# Patient Record
Sex: Female | Born: 1950
Health system: Southern US, Community
[De-identification: ages and names within clinical notes are randomized; demographics above are authoritative.]

## PROBLEM LIST (undated history)

## (undated) DIAGNOSIS — G43909 Migraine, unspecified, not intractable, without status migrainosus: Secondary | ICD-10-CM

## (undated) DIAGNOSIS — C801 Malignant (primary) neoplasm, unspecified: Secondary | ICD-10-CM

## (undated) DIAGNOSIS — Z9289 Personal history of other medical treatment: Secondary | ICD-10-CM

## (undated) DIAGNOSIS — C50919 Malignant neoplasm of unspecified site of unspecified female breast: Secondary | ICD-10-CM

## (undated) DIAGNOSIS — E785 Hyperlipidemia, unspecified: Secondary | ICD-10-CM

## (undated) DIAGNOSIS — Z923 Personal history of irradiation: Secondary | ICD-10-CM

## (undated) DIAGNOSIS — Z951 Presence of aortocoronary bypass graft: Secondary | ICD-10-CM

## (undated) DIAGNOSIS — K635 Polyp of colon: Secondary | ICD-10-CM

## (undated) DIAGNOSIS — T7840XA Allergy, unspecified, initial encounter: Secondary | ICD-10-CM

## (undated) DIAGNOSIS — I2510 Atherosclerotic heart disease of native coronary artery without angina pectoris: Secondary | ICD-10-CM

## (undated) DIAGNOSIS — K219 Gastro-esophageal reflux disease without esophagitis: Secondary | ICD-10-CM

## (undated) HISTORY — DX: Hyperlipidemia, unspecified: E78.5

## (undated) HISTORY — DX: Migraine, unspecified, not intractable, without status migrainosus: G43.909

## (undated) HISTORY — DX: Polyp of colon: K63.5

## (undated) HISTORY — PX: OTHER SURGICAL HISTORY: SHX169

## (undated) HISTORY — PX: COLONOSCOPY W/ POLYPECTOMY: SHX1380

## (undated) HISTORY — PX: BREAST SURGERY: SHX581

## (undated) HISTORY — DX: Allergy, unspecified, initial encounter: T78.40XA

## (undated) HISTORY — PX: EYE SURGERY: SHX253

## (undated) HISTORY — DX: Gastro-esophageal reflux disease without esophagitis: K21.9

## (undated) HISTORY — PX: ABDOMINAL HYSTERECTOMY: SHX81

## (undated) HISTORY — PX: BUNIONECTOMY: SHX129

## (undated) HISTORY — PX: COLONOSCOPY: SHX174

## (undated) HISTORY — DX: Malignant (primary) neoplasm, unspecified: C80.1

## (undated) HISTORY — DX: Presence of aortocoronary bypass graft: Z95.1

---

## 1998-05-31 ENCOUNTER — Ambulatory Visit (HOSPITAL_COMMUNITY): Admission: RE | Admit: 1998-05-31 | Discharge: 1998-05-31 | Payer: Self-pay | Admitting: *Deleted

## 1998-06-02 ENCOUNTER — Ambulatory Visit (HOSPITAL_COMMUNITY): Admission: RE | Admit: 1998-06-02 | Discharge: 1998-06-02 | Payer: Self-pay

## 1998-07-27 ENCOUNTER — Ambulatory Visit (HOSPITAL_COMMUNITY): Admission: RE | Admit: 1998-07-27 | Discharge: 1998-07-27 | Payer: Self-pay | Admitting: General Surgery

## 1998-08-05 ENCOUNTER — Encounter: Admission: RE | Admit: 1998-08-05 | Discharge: 1998-11-03 | Payer: Self-pay | Admitting: Radiation Oncology

## 1998-10-22 DIAGNOSIS — C50919 Malignant neoplasm of unspecified site of unspecified female breast: Secondary | ICD-10-CM

## 1998-10-22 DIAGNOSIS — Z923 Personal history of irradiation: Secondary | ICD-10-CM

## 1998-10-22 HISTORY — PX: BREAST LUMPECTOMY: SHX2

## 1998-10-22 HISTORY — DX: Malignant neoplasm of unspecified site of unspecified female breast: C50.919

## 1998-10-22 HISTORY — DX: Personal history of irradiation: Z92.3

## 1999-05-01 ENCOUNTER — Ambulatory Visit (HOSPITAL_COMMUNITY): Admission: RE | Admit: 1999-05-01 | Discharge: 1999-05-01 | Payer: Self-pay | Admitting: General Surgery

## 1999-05-01 ENCOUNTER — Encounter: Payer: Self-pay | Admitting: General Surgery

## 1999-10-30 ENCOUNTER — Ambulatory Visit (HOSPITAL_COMMUNITY): Admission: RE | Admit: 1999-10-30 | Discharge: 1999-10-30 | Payer: Self-pay | Admitting: General Surgery

## 1999-10-30 ENCOUNTER — Encounter: Payer: Self-pay | Admitting: General Surgery

## 2000-04-08 ENCOUNTER — Encounter: Payer: Self-pay | Admitting: General Surgery

## 2000-04-08 ENCOUNTER — Encounter: Admission: RE | Admit: 2000-04-08 | Discharge: 2000-04-08 | Payer: Self-pay | Admitting: General Surgery

## 2000-12-07 ENCOUNTER — Emergency Department (HOSPITAL_COMMUNITY): Admission: EM | Admit: 2000-12-07 | Discharge: 2000-12-07 | Payer: Self-pay | Admitting: Emergency Medicine

## 2001-04-09 ENCOUNTER — Encounter: Admission: RE | Admit: 2001-04-09 | Discharge: 2001-04-09 | Payer: Self-pay | Admitting: General Surgery

## 2001-04-09 ENCOUNTER — Encounter: Payer: Self-pay | Admitting: General Surgery

## 2001-04-19 ENCOUNTER — Emergency Department (HOSPITAL_COMMUNITY): Admission: EM | Admit: 2001-04-19 | Discharge: 2001-04-19 | Payer: Self-pay | Admitting: *Deleted

## 2001-04-19 ENCOUNTER — Encounter: Payer: Self-pay | Admitting: Emergency Medicine

## 2001-09-11 ENCOUNTER — Emergency Department (HOSPITAL_COMMUNITY): Admission: EM | Admit: 2001-09-11 | Discharge: 2001-09-11 | Payer: Self-pay | Admitting: Emergency Medicine

## 2002-04-10 ENCOUNTER — Encounter: Payer: Self-pay | Admitting: General Surgery

## 2002-04-10 ENCOUNTER — Ambulatory Visit (HOSPITAL_COMMUNITY): Admission: RE | Admit: 2002-04-10 | Discharge: 2002-04-10 | Payer: Self-pay | Admitting: General Surgery

## 2003-04-15 ENCOUNTER — Ambulatory Visit (HOSPITAL_COMMUNITY): Admission: RE | Admit: 2003-04-15 | Discharge: 2003-04-15 | Payer: Self-pay | Admitting: General Surgery

## 2003-04-15 ENCOUNTER — Encounter: Payer: Self-pay | Admitting: General Surgery

## 2004-02-28 ENCOUNTER — Emergency Department (HOSPITAL_COMMUNITY): Admission: EM | Admit: 2004-02-28 | Discharge: 2004-02-28 | Payer: Self-pay | Admitting: Emergency Medicine

## 2004-04-17 ENCOUNTER — Ambulatory Visit (HOSPITAL_COMMUNITY): Admission: RE | Admit: 2004-04-17 | Discharge: 2004-04-17 | Payer: Self-pay | Admitting: General Surgery

## 2004-07-28 ENCOUNTER — Emergency Department (HOSPITAL_COMMUNITY): Admission: EM | Admit: 2004-07-28 | Discharge: 2004-07-28 | Payer: Self-pay | Admitting: Emergency Medicine

## 2004-08-01 ENCOUNTER — Ambulatory Visit (HOSPITAL_COMMUNITY): Admission: RE | Admit: 2004-08-01 | Discharge: 2004-08-01 | Payer: Self-pay | Admitting: Orthopedic Surgery

## 2004-08-01 ENCOUNTER — Ambulatory Visit (HOSPITAL_BASED_OUTPATIENT_CLINIC_OR_DEPARTMENT_OTHER): Admission: RE | Admit: 2004-08-01 | Discharge: 2004-08-01 | Payer: Self-pay | Admitting: Orthopedic Surgery

## 2005-04-18 ENCOUNTER — Ambulatory Visit (HOSPITAL_COMMUNITY): Admission: RE | Admit: 2005-04-18 | Discharge: 2005-04-18 | Payer: Self-pay | Admitting: General Surgery

## 2006-02-20 ENCOUNTER — Encounter: Payer: Self-pay | Admitting: General Surgery

## 2006-04-22 ENCOUNTER — Ambulatory Visit (HOSPITAL_COMMUNITY): Admission: RE | Admit: 2006-04-22 | Discharge: 2006-04-22 | Payer: Self-pay | Admitting: General Surgery

## 2007-05-01 ENCOUNTER — Ambulatory Visit (HOSPITAL_COMMUNITY): Admission: RE | Admit: 2007-05-01 | Discharge: 2007-05-01 | Payer: Self-pay | Admitting: General Surgery

## 2008-05-04 ENCOUNTER — Ambulatory Visit (HOSPITAL_COMMUNITY): Admission: RE | Admit: 2008-05-04 | Discharge: 2008-05-04 | Payer: Self-pay | Admitting: Internal Medicine

## 2009-06-02 ENCOUNTER — Ambulatory Visit (HOSPITAL_COMMUNITY): Admission: RE | Admit: 2009-06-02 | Discharge: 2009-06-02 | Payer: Self-pay | Admitting: Internal Medicine

## 2010-11-12 ENCOUNTER — Encounter: Payer: Self-pay | Admitting: Internal Medicine

## 2011-03-09 NOTE — Op Note (Signed)
Jessica [name], Jessica [name]               ACCOUNT NO.:  [id number]   MEDICAL RECORD NO.:  [id number]          PATIENT TYPE:  AMB   LOCATION:  DSC                          FACILITY:  [facility]   PHYSICIAN:  Katy Fitch. Sypher Montez Hageman., M.D.DATE OF BIRTH:  Dec 28, 1950   DATE OF PROCEDURE:  DATE OF DISCHARGE:                                 OPERATIVE REPORT   DATE OF OPERATION:  August 01, 2004.   PREOPERATIVE DIAGNOSIS:  Interarticular, displaced, middle phalangeal  fracture, left index finger at PIP joint.   POSTOPERATIVE DIAGNOSIS:  Interarticular, displaced, middle phalangeal  fracture, left index finger at PIP joint.   OPERATION:  Open reduction, internal fixation, left index finger, middle  phalangeal proximal metaphyseal and epiphyseal fracture, interarticular at  left index PIP joint, utilizing lag screw fixation x3.   OPERATING SURGEON:  Katy Fitch. Sypher, MD.   ASSISTANT:  Marveen Reeks. Dasnoit, PA-C.   ANESTHESIA:  General by LMA.   SUPERVISING ANESTHESIOLOGIST:  Zenon Mayo, MD.   INDICATIONS:  Jessica [name] is a 60 year old woman, who sustained a  significant injury in a fall to her left index finger.  She was seen at the  emergency room where x-rays revealed a displaced interarticular fracture of  her left index middle phalanx with splitting of the articular surface and  subluxation of the PIP joint.   She was splinted and referred to the [facility] for followup  care.   After examination in the office on July 31, 2004, we recommended  proceeding with open reduction, internal fixation of articular fracture to  obtain and maintain a reduction with lag screw fixation.   A pertinent note is that at age 25, she sustained a complex injury to the  flexor surface of her left index finger and has had no flexion tendon  function and altered sensibility since the time of the injury.   This decreases the demands on the finger.  However, due to the unstable  interarticular nature of the fracture, she is still a candidate for open  reduction, internal fixation at this time to prevent deformity and to  provide a congruous articular surface.   Jessica [name] is brought to the operating room and placed in the supine  position on the operating table.  Following induction of general anesthesia  by LMA, the left arm was prepped with Betadine soap and solution and  sterilely draped.   Following exsanguination of the left arm with an Esmarch bandage, the  tourniquet was inflated to 220 mmHg.   Procedure commenced with a curvilinear incision exposing the proximal half  of the middle phalanx and PIP joint.   The extensor mechanism was split in the midline releasing the triangular  ligament.  The lateral bands were retracted radially and ulnar, and a self-  retaining retractor was placed.   The fracture was reduced manually and secured with a 0.025 inch provisional  Kirschner wire.  Anatomic reduction of the joint surface was achieved.   The volar cortex of the middle phalanx remained intact, allowing an  excellent purchase for screws.   The  dorsal fragment was comminuted.   A central 1.5 mm lag screw was placed, reducing the main joint fragments  anatomically.  A radial and ulnar 1.3 mm lag screw was placed, securing the  dorsal fragment further to the volar fragment.  The additional fixation was  required due to the fact that the dorsal fragment was comminuted.   Anatomic reduction and good position of the hardware were achieved.   The wound was then irrigated thoroughly, and the triangular ligament  repaired with multiple mattress sutures of 4-0 Vicryl followed by repair of  the skin with intradermal 4-0 Prolene.   A compressive dressing was applied with a hand-based index finger sandwich  splint to protect the construct.   There were no apparent complications.  Jessica [name] tolerated the surgery and  anesthesia well.  She will be discharged  to the care of her family with  prescriptions for Dilaudid 2 mg 1 or 2 tablets p.o.q.4-6h p.r.n. pain, 30  tablets without refills.  Also, Motrin 600 mg 1 p.o. q.6h p.r.n. pain, 30  tablets with 1 refill, and Keflex 500 mg 1 p.o. q.8h x4 days as a  prophylactic antibiotic.       RVS/MEDQ  D:  08/01/2004  T:  08/01/2004  Job:  [id number]   cc:   2 copies to Dr. Stark Jock office

## 2015-10-25 DIAGNOSIS — J069 Acute upper respiratory infection, unspecified: Secondary | ICD-10-CM | POA: Diagnosis not present

## 2015-11-15 DIAGNOSIS — L668 Other cicatricial alopecia: Secondary | ICD-10-CM | POA: Diagnosis not present

## 2015-11-15 DIAGNOSIS — L232 Allergic contact dermatitis due to cosmetics: Secondary | ICD-10-CM | POA: Diagnosis not present

## 2016-02-21 DIAGNOSIS — R5383 Other fatigue: Secondary | ICD-10-CM | POA: Diagnosis not present

## 2016-02-21 DIAGNOSIS — L218 Other seborrheic dermatitis: Secondary | ICD-10-CM | POA: Diagnosis not present

## 2016-02-21 DIAGNOSIS — R21 Rash and other nonspecific skin eruption: Secondary | ICD-10-CM | POA: Diagnosis not present

## 2016-02-21 DIAGNOSIS — L298 Other pruritus: Secondary | ICD-10-CM | POA: Diagnosis not present

## 2016-02-21 DIAGNOSIS — R030 Elevated blood-pressure reading, without diagnosis of hypertension: Secondary | ICD-10-CM | POA: Diagnosis not present

## 2016-02-21 DIAGNOSIS — Z79899 Other long term (current) drug therapy: Secondary | ICD-10-CM | POA: Diagnosis not present

## 2016-03-12 DIAGNOSIS — L649 Androgenic alopecia, unspecified: Secondary | ICD-10-CM | POA: Diagnosis not present

## 2016-03-12 DIAGNOSIS — L218 Other seborrheic dermatitis: Secondary | ICD-10-CM | POA: Diagnosis not present

## 2016-04-23 DIAGNOSIS — L219 Seborrheic dermatitis, unspecified: Secondary | ICD-10-CM | POA: Diagnosis not present

## 2016-06-19 DIAGNOSIS — Z853 Personal history of malignant neoplasm of breast: Secondary | ICD-10-CM | POA: Insufficient documentation

## 2016-06-26 DIAGNOSIS — R922 Inconclusive mammogram: Secondary | ICD-10-CM | POA: Diagnosis not present

## 2016-06-26 DIAGNOSIS — Z923 Personal history of irradiation: Secondary | ICD-10-CM | POA: Diagnosis not present

## 2016-06-26 DIAGNOSIS — Z853 Personal history of malignant neoplasm of breast: Secondary | ICD-10-CM | POA: Diagnosis not present

## 2016-06-29 DIAGNOSIS — S53421A Ulnohumeral (joint) sprain of right elbow, initial encounter: Secondary | ICD-10-CM | POA: Diagnosis not present

## 2016-07-13 DIAGNOSIS — S53421D Ulnohumeral (joint) sprain of right elbow, subsequent encounter: Secondary | ICD-10-CM | POA: Diagnosis not present

## 2016-07-19 ENCOUNTER — Ambulatory Visit: Payer: Self-pay | Admitting: Internal Medicine

## 2016-07-27 ENCOUNTER — Other Ambulatory Visit (INDEPENDENT_AMBULATORY_CARE_PROVIDER_SITE_OTHER): Payer: Medicare Other

## 2016-07-27 ENCOUNTER — Ambulatory Visit (INDEPENDENT_AMBULATORY_CARE_PROVIDER_SITE_OTHER): Payer: Medicare Other | Admitting: Internal Medicine

## 2016-07-27 ENCOUNTER — Encounter: Payer: Self-pay | Admitting: Internal Medicine

## 2016-07-27 VITALS — BP 136/60 | HR 51 | Temp 98.4°F | Resp 16 | Ht 66.5 in | Wt 186.0 lb

## 2016-07-27 DIAGNOSIS — E789 Disorder of lipoprotein metabolism, unspecified: Secondary | ICD-10-CM | POA: Diagnosis not present

## 2016-07-27 DIAGNOSIS — Z23 Encounter for immunization: Secondary | ICD-10-CM | POA: Diagnosis not present

## 2016-07-27 DIAGNOSIS — E559 Vitamin D deficiency, unspecified: Secondary | ICD-10-CM

## 2016-07-27 DIAGNOSIS — E538 Deficiency of other specified B group vitamins: Secondary | ICD-10-CM

## 2016-07-27 DIAGNOSIS — R5383 Other fatigue: Secondary | ICD-10-CM | POA: Diagnosis not present

## 2016-07-27 DIAGNOSIS — K219 Gastro-esophageal reflux disease without esophagitis: Secondary | ICD-10-CM

## 2016-07-27 DIAGNOSIS — R7301 Impaired fasting glucose: Secondary | ICD-10-CM | POA: Diagnosis not present

## 2016-07-27 DIAGNOSIS — Z853 Personal history of malignant neoplasm of breast: Secondary | ICD-10-CM

## 2016-07-27 DIAGNOSIS — M79601 Pain in right arm: Secondary | ICD-10-CM

## 2016-07-27 LAB — LIPID PANEL
CHOL/HDL RATIO: 6
Cholesterol: 220 mg/dL — ABNORMAL HIGH (ref 0–200)
HDL: 37.4 mg/dL — AB (ref >39.00)
LDL Cholesterol: 156 mg/dL — ABNORMAL HIGH (ref 0–99)
NONHDL: 182.5
Triglycerides: 133 mg/dL (ref 0.0–149.0)
VLDL: 26.6 mg/dL (ref 0.0–40.0)

## 2016-07-27 LAB — COMPREHENSIVE METABOLIC PANEL
ALT: 21 U/L (ref 0–35)
AST: 23 U/L (ref 0–37)
Albumin: 4 g/dL (ref 3.5–5.2)
Alkaline Phosphatase: 49 U/L (ref 39–117)
BILIRUBIN TOTAL: 0.3 mg/dL (ref 0.2–1.2)
BUN: 10 mg/dL (ref 6–23)
CO2: 31 meq/L (ref 19–32)
Calcium: 9.4 mg/dL (ref 8.4–10.5)
Chloride: 105 mEq/L (ref 96–112)
Creatinine, Ser: 0.99 mg/dL (ref 0.40–1.20)
GFR: 72.19 mL/min (ref >60.00)
GLUCOSE: 85 mg/dL (ref 70–99)
Potassium: 4.1 mEq/L (ref 3.5–5.1)
SODIUM: 142 meq/L (ref 135–145)
TOTAL PROTEIN: 7.2 g/dL (ref 6.0–8.3)

## 2016-07-27 LAB — VITAMIN D 25 HYDROXY (VIT D DEFICIENCY, FRACTURES): VITD: 21.83 ng/mL — AB (ref 30.00–100.00)

## 2016-07-27 LAB — HEMOGLOBIN A1C: HEMOGLOBIN A1C: 5.8 % (ref 4.6–6.5)

## 2016-07-27 LAB — CBC
HCT: 37 % (ref 36.0–46.0)
HEMOGLOBIN: 12.4 g/dL (ref 12.0–15.0)
MCHC: 33.5 g/dL (ref 30.0–36.0)
MCV: 86.9 fl (ref 78.0–100.0)
PLATELETS: 240 10*3/uL (ref 150.0–400.0)
RBC: 4.26 Mil/uL (ref 3.87–5.11)
RDW: 13.9 % (ref 11.5–15.5)
WBC: 5.1 10*3/uL (ref 4.0–10.5)

## 2016-07-27 LAB — TSH: TSH: 1.55 u[IU]/mL (ref 0.35–4.50)

## 2016-07-27 LAB — VITAMIN B12: Vitamin B-12: 300 pg/mL (ref 211–911)

## 2016-07-27 MED ORDER — FAMOTIDINE 40 MG PO TABS: 40.0000 mg | ORAL_TABLET | Freq: Every day | ORAL | 3 refills | Status: DC

## 2016-07-27 NOTE — Progress Notes (Signed)
Pre visit review using our clinic review tool, if applicable. No additional management support is needed unless otherwise documented below in the visit note. 

## 2016-07-27 NOTE — Progress Notes (Signed)
   Subjective:    Patient ID: Nicholas Lose, female    DOB: 06-02-1951, 65 y.o.   MRN: [id number]  HPI The patient is a new 65 YO female coming in for right arm pain. Started when she was moving from [place] [employer] and moving a lot of boxes and picking up lots of things. She saw a specialist in [place] who took x-rays which were normal. She was told to ice and use ibuprofen which did not help. Sometimes it is so bad it limits her strength. She denies numbness in her arm or shoulder. Pain is from the elbow to the hand. She has other concerns about her chronic problems so see A/P for details.   PMH, [facility], social history reviewed and updated.   Review of Systems  Constitutional: Negative for activity change, appetite change, fatigue, fever and unexpected weight change.  Eyes: Negative.   Respiratory: Negative.   Cardiovascular: Negative for chest pain, palpitations and leg swelling.  Gastrointestinal: Negative for abdominal distention, abdominal pain, constipation, diarrhea and nausea.       Worsening GERD  Musculoskeletal: Positive for arthralgias and myalgias. Negative for back pain, gait problem and joint swelling.  Skin: Negative.   Neurological: Negative for dizziness, weakness, light-headedness and numbness.  Psychiatric/Behavioral: Negative.       Objective:   Physical Exam  Constitutional: She is oriented to person, place, and time. She appears well-developed and well-nourished.  HENT:  Head: Normocephalic and atraumatic.  Eyes: EOM are normal.  Neck: Normal range of motion.  Cardiovascular: Normal rate and regular rhythm.   Pulmonary/Chest: Effort normal and breath sounds normal. No respiratory distress. She has no wheezes. She has no rales.  Abdominal: Soft. Bowel sounds are normal. She exhibits no distension. There is no tenderness. There is no rebound.  Musculoskeletal: She exhibits tenderness.  Pain in the right forearm and radiates into the hand from the elbow with palpation.     Neurological: She is alert and oriented to person, place, and time. Coordination normal.  Skin: Skin is warm and dry.  Psychiatric: She has a normal mood and affect.   Vitals:   07/27/16 0904  BP: 136/60  Pulse: (!) 51  Resp: 16  Temp: 98.4 F (36.9 C)  TempSrc: Oral  SpO2: 99%  Weight: 186 lb (84.4 kg)  Height: 5' 6.5" (1.689 m)      Assessment & Plan:  Flu and prevnar 13 given at visit.

## 2016-07-27 NOTE — Patient Instructions (Addendum)
We have sent in pepcid for the heart burn to help more. You can take it in the evening to help with night time symptoms. Work on eating earlier in the evening to help avoid symptoms.   We are checking the blood work today and will call you back with the results.  We will have you see Dr. Tamala Julian who is a sports medicine doctor to check the arm.   Food Choices for Gastroesophageal Reflux Disease, Adult When you have gastroesophageal reflux disease (GERD), the foods you eat and your eating habits are very important. Choosing the right foods can help ease the discomfort of GERD. WHAT GENERAL GUIDELINES DO I NEED TO FOLLOW?  Choose fruits, vegetables, whole grains, low-fat dairy products, and low-fat meat, fish, and poultry.  Limit fats such as oils, salad dressings, butter, nuts, and avocado.  Keep a food diary to identify foods that cause symptoms.  Avoid foods that cause reflux. These may be different for different people.  Eat frequent small meals instead of three large meals each day.  Eat your meals slowly, in a relaxed setting.  Limit fried foods.  Cook foods using methods other than frying.  Avoid drinking alcohol.  Avoid drinking large amounts of liquids with your meals.  Avoid bending over or lying down until 2-3 hours after eating. WHAT FOODS ARE NOT RECOMMENDED? The following are some foods and drinks that may worsen your symptoms: Vegetables Tomatoes. Tomato juice. Tomato and spaghetti sauce. Chili peppers. Onion and garlic. Horseradish. Fruits Oranges, grapefruit, and lemon (fruit and juice). Meats High-fat meats, fish, and poultry. This includes hot dogs, ribs, ham, sausage, salami, and bacon. Dairy Whole milk and chocolate milk. Sour cream. Cream. Butter. Ice cream. Cream cheese.  Beverages Coffee and tea, with or without caffeine. Carbonated beverages or energy drinks. Condiments Hot sauce. Barbecue sauce.  Sweets/Desserts Chocolate and cocoa. Donuts.  Peppermint and spearmint. Fats and Oils High-fat foods, including [place] fries and potato chips. Other Vinegar. Strong spices, such as black pepper, white pepper, red pepper, cayenne, curry powder, cloves, ginger, and chili powder. The items listed above may not be a complete list of foods and beverages to avoid. Contact your dietitian for more information.   This information is not intended to replace advice given to you by your health care provider. Make sure you discuss any questions you have with your health care provider.   Document Released: 10/08/2005 Document Revised: 10/29/2014 Document Reviewed: 08/12/2013 Elsevier Interactive Patient Education [employer].

## 2016-07-28 DIAGNOSIS — M79601 Pain in right arm: Secondary | ICD-10-CM | POA: Insufficient documentation

## 2016-07-28 DIAGNOSIS — K219 Gastro-esophageal reflux disease without esophagitis: Secondary | ICD-10-CM | POA: Insufficient documentation

## 2016-07-28 NOTE — Assessment & Plan Note (Signed)
Not doing well on zantac and does not want to be on PPI so will try pepcid rx today.

## 2016-07-28 NOTE — Assessment & Plan Note (Signed)
In 1999 and now >10 years out from cancer. Last mammogram in September normal.

## 2016-07-28 NOTE — Assessment & Plan Note (Signed)
Refer to sports med for [place] and treatment. Likely a tendon problem with extended course. She is encouraged to continue with ibuprofen for pain as needed.

## 2016-08-07 ENCOUNTER — Ambulatory Visit: Payer: Medicare Other | Admitting: Internal Medicine

## 2016-08-07 NOTE — Progress Notes (Signed)
[name] [address] [place], [place] [address] Phone: [phone] Subjective:    I'm seeing this patient by the request  of:  Hoyt Koch, MD  CC: right arm pain   [id number]  Jessica [name] is a 65 y.o. female coming in with complaint of right arm pain.  Patient recently moved. Did have to do a lot of repetitive lifting. Saw specialist in another sitting and did have x-rays which she was told was normal. Patient has been trying ice and over-the-counter anti-inflammatories with no significant improvement. States that she is having limited strength. States that there is some radiation of pain down the arm including some numbness. Most the pain seems to be from the elbow to the hand.States that there is very minimal pain of the upper arm at this time. Denies any neck pain associated with it. Rates the severity of pain though is 7 out of 10. Sometimes has days where it seems to be getting better and then daily that is very severe that wakes her up at night.     Past Medical History:  Diagnosis Date  . Allergy   . Cancer [facility])    breast  . Colon polyp   . GERD (gastroesophageal reflux disease)   . Hyperlipidemia   . Migraine    Past Surgical History:  Procedure Laterality Date  . ABDOMINAL HYSTERECTOMY    . BREAST SURGERY    . EYE SURGERY    . hemorrhoid     Social History   Social History  . Marital status: Married    Spouse name: N/A  . Number of children: N/A  . Years of education: N/A   Social History Main Topics  . Smoking status: Never Smoker  . Smokeless tobacco: Never Used  . Alcohol use Yes  . Drug use:   . Sexual activity: Yes   Other Topics Concern  . Not on file   Social History Narrative  . No narrative on file   Allergies  Allergen Reactions  . Codeine   . Sulfa Antibiotics    Family History  Problem Relation Age of Onset  . Heart disease Brother   . Asthma Paternal Aunt   . Diabetes Paternal Aunt    . Stroke Paternal Uncle   . Kidney disease Paternal Uncle     Past medical history, social, surgical and family history all reviewed in electronic medical record.  No pertanent information unless stated regarding to the chief complaint.   Review of Systems: No headache, visual changes, nausea, vomiting, diarrhea, constipation, dizziness, abdominal pain, skin rash, fevers, chills, night sweats, weight loss, swollen lymph nodes, body aches, joint swelling, muscle aches, chest pain, shortness of breath, mood changes.   Objective  There were no vitals taken for this visit.  General: No apparent distress alert and oriented x3 mood and affect normal, dressed appropriately.  HEENT: Pupils equal, extraocular movements intact  Respiratory: Patient's speak in full sentences and does not appear short of breath  Cardiovascular: No lower extremity edema, non tender, no erythema  Skin: Warm dry intact with no signs of infection or rash on extremities or on axial skeleton.  Abdomen: Soft nontender  Neuro: Cranial nerves II through XII are intact, neurovascularly intact in all extremities with 2+ DTRs and 2+ pulses.  Lymph: No lymphadenopathy of posterior or anterior cervical chain or axillae bilaterally.  Gait normal with good balance and coordination.  MSK:  Non tender with full range of  motion and good stability and symmetric strength and tone of shoulders, , wrist, hip, knee and ankles bilaterally.  Neck: Inspection unremarkable. No palpable stepoffs. Negative Spurling's maneuver. Full neck range of motion Grip strength and sensation normal in bilateral hands Strength good C4 to T1 distribution No sensory change to C4 to T1 Negative Hoffman sign bilaterally Reflexes normal  Elbow: Right Unremarkable to inspection. Range of motion full pronation, supination, flexion, extension. Strength is full to all of the above directions Stable to varus, valgus stress. Negative moving valgus stress  test. Severe tenderness within the suprafascial muscles of the flexor forearm Ulnar nerve does not sublux. Negative cubital tunnel Tinel's. Contralateral unremarkable Negative Tinel's at rest  Musculoskeletal ultrasound was performed and interpreted by Charlann Boxer D.O.   Elbow:  Lateral epicondyle and common extensor tendon origin visualized.  No edema, effusions, or avulsions seen.  Radial head unremarkable and located in annular ligament Medial epicondyle and common flexor tendon origin visualized.   Patient though in the belly of the superficial digitorum muscle does have an area that appears to be a muscle tear. Measures 1.6 cm in diameter. Hypoechoic changes and mild increase in Doppler flow surrounding the area  IMPRESSION:  Superficial flexor digitorum muscle tear    Impression and Recommendations:     This case required medical decision making of moderate complexity.      Note: This dictation was prepared with Dragon dictation along with smaller phrase technology. Any transcriptional errors that result from this process are unintentional.

## 2016-08-08 ENCOUNTER — Encounter: Payer: Self-pay | Admitting: Family Medicine

## 2016-08-08 ENCOUNTER — Ambulatory Visit (INDEPENDENT_AMBULATORY_CARE_PROVIDER_SITE_OTHER): Payer: Medicare Other | Admitting: Family Medicine

## 2016-08-08 ENCOUNTER — Ambulatory Visit: Payer: Self-pay

## 2016-08-08 VITALS — BP 130/82 | HR 49 | Wt 186.0 lb

## 2016-08-08 DIAGNOSIS — M79601 Pain in right arm: Secondary | ICD-10-CM

## 2016-08-08 DIAGNOSIS — M779 Enthesopathy, unspecified: Secondary | ICD-10-CM

## 2016-08-08 DIAGNOSIS — M778 Other enthesopathies, not elsewhere classified: Secondary | ICD-10-CM

## 2016-08-08 NOTE — Assessment & Plan Note (Signed)
Patient does have a tear noted today. This is likely why patient has not improved a significant amount. Patient will be put in a wrist brace to avoid any flexion of the wrist splint the muscle. We also discussed icing regimen, given trial of topical anti-inflammatories, patient will follow-up again in 2 weeks. At that time if improvement we will started to formal physical therapy or home exercises.

## 2016-08-08 NOTE — Patient Instructions (Signed)
Right sided muscle tear Wear wrist brace day and night for 2 weeks Only lift things overhand and in the wrist splint Ice 20 minutes 2 times daily. Usually after activity and before bed. pennsaid pinkie amount topically 2 times daily as needed.  No lifting more then a coffee cup See me again in 2 weeks and make sure you are healing

## 2016-08-13 DIAGNOSIS — H04123 Dry eye syndrome of bilateral lacrimal glands: Secondary | ICD-10-CM | POA: Diagnosis not present

## 2016-08-21 NOTE — Progress Notes (Signed)
[name] [place],  [address] Phone: [phone] Subjective:    CC: right arm pain Follow uo  [id number]  Jessica [name] is a 65 y.o. female coming in with complaint of right arm pain.  Patient was found to have a right flexor digitorum muscle tear. Patient was seen me in a brace, avoid any lifting, icing regimen. Patient states She is making progress. Still has some mild discomfort but nothing severe. States that she seems to be doing relatively well. No numbness. States that sometimes the hand and wrist feels tight. Patient has been religious to wear the brace.      Past medical, surgical, social and family history all reviewed as of 08/21/16.  No pertanent information unless stated regarding to the chief complaint.  Past Medical History:  Diagnosis Date  . Allergy   . Cancer [facility]    breast  . Colon polyp   . GERD (gastroesophageal reflux disease)   . Hyperlipidemia   . Migraine    Past Surgical History:  Procedure Laterality Date  . ABDOMINAL HYSTERECTOMY    . BREAST SURGERY    . EYE SURGERY    . hemorrhoid     Social History   Social History  . Marital status: Married    Spouse name: N/A  . Number of children: N/A  . Years of education: N/A   Social History Main Topics  . Smoking status: Never Smoker  . Smokeless tobacco: Never Used  . Alcohol use Yes  . Drug use:   . Sexual activity: Yes   Other Topics Concern  . Not on file   Social History Narrative  . No narrative on file   Allergies  Allergen Reactions  . Codeine   . Sulfa Antibiotics    Family History  Problem Relation Age of Onset  . Heart disease Brother   . Asthma Paternal Aunt   . Diabetes Paternal Aunt   . Stroke Paternal Uncle   . Kidney disease Paternal Uncle      Review of systems reviewed as of 08/21/16 Review of Systems: No headache, visual changes, nausea, vomiting, diarrhea, constipation, dizziness, abdominal pain, skin  rash, fevers, chills, night sweats, weight loss, swollen lymph nodes, body aches, joint swelling, muscle aches, chest pain, shortness of breath, mood changes.   Objective  There were no vitals taken for this visit. Systems examined below as of 08/21/16   General: Patient is not under any distress. Alert and oriented 3.  HEENT: Pupils equal, extraocular movements intact  Respiratory: Patient's speak in full sentences and does not appear short of breath  Cardiovascular: No lower extremity edema Skin: Warm dry intact with no signs of infection or rash on extremities or on axial skeleton.  Abdomen: Soft nontender Neuro: Cranial nerves II through XII are intact, neurovascularly intact in all extremities with 2+ DTRs and 2+ pulses.  Lymph: No lymphadenopathy of posterior or anterior cervical chain or axillae bilaterally.  Gait normal with good balance and coordination.  MSK:  Non tender with full range of motion and good stability and symmetric strength and tone of shoulders,  wrist, hip, knee and ankles bilaterally.    Elbow: Right Unremarkable to inspection. Range of motion full pronation, supination, flexion, extension. Strength is full to all of the above directions Stable to varus, valgus stress. Negative moving valgus stress test. Very minimal tenderness still remaining on the flexor digitorum of the forearm. Negative cubital tunnel Tinel's. Contralateral unremarkable  Negative Tinel's at rest  Musculoskeletal ultrasound was performed and interpreted by Charlann Boxer D.O.   Elbow:  Superficial digitorum muscle does show good healing. Scar tissue formation noted. Still having increasing Doppler flow at the tendon insertion.  IMPRESSION:  Superficial flexor digitorum muscle tear with significant interval healing with continued tendinitis.     Impression and Recommendations:     This case required medical decision making of moderate complexity.      Note: This dictation was  prepared with Dragon dictation along with smaller phrase technology. Any transcriptional errors that result from this process are unintentional.

## 2016-08-22 ENCOUNTER — Encounter: Payer: Self-pay | Admitting: Family Medicine

## 2016-08-22 ENCOUNTER — Ambulatory Visit (INDEPENDENT_AMBULATORY_CARE_PROVIDER_SITE_OTHER): Payer: Medicare Other | Admitting: Family Medicine

## 2016-08-22 ENCOUNTER — Ambulatory Visit: Payer: Self-pay

## 2016-08-22 VITALS — BP 124/82 | HR 60 | Wt 186.0 lb

## 2016-08-22 DIAGNOSIS — M779 Enthesopathy, unspecified: Secondary | ICD-10-CM | POA: Diagnosis not present

## 2016-08-22 DIAGNOSIS — S59911D Unspecified injury of right forearm, subsequent encounter: Secondary | ICD-10-CM

## 2016-08-22 DIAGNOSIS — M778 Other enthesopathies, not elsewhere classified: Secondary | ICD-10-CM

## 2016-08-22 NOTE — Patient Instructions (Signed)
Great to see you  Jessica [name] is your friend Wear brace day and night for this week and the weekend Starting Monday wear it only at night for another 2 weeks.  Monday also start exercises 3 times a week.  Avoid heavy lifting.  See me again in 4 weeks to make sure completely healed.

## 2016-08-22 NOTE — Assessment & Plan Note (Signed)
Patient is making progress at this time. Given home exercises that I think be more beneficial. Patient will start this. We discussed wearing the brace only at night. We discussed home exercises and patient work with Product/process development scientist. We discussed icing regimen. Patient will come back and see me again in 4 weeks to make sure that she is completely healed at that time.

## 2016-09-18 NOTE — Progress Notes (Signed)
[name] [address] [place], [place] [address] Phone: [phone] Subjective:    CC: right arm pain Follow uo  [id number]  Jessica [name] is a 65 y.o. female coming in with complaint of right arm pain.  Patient was found to have a right flexor digitorum muscle tear.Patient is making significant improvement in last follow-up. Patient was to continue to increase activity slowly. Patient states Approximately on a percent better. Patient did have one day where she had severe amount of pain near the elbow that completely went away on its own. Does not remember any true injury. Feels though that at this point seems to be back to her baseline.      Past medical, surgical, social and family history all reviewed as of 09/19/16.  No pertanent information unless stated regarding to the chief complaint.  Past Medical History:  Diagnosis Date  . Allergy   . Cancer [facility])    breast  . Colon polyp   . GERD (gastroesophageal reflux disease)   . Hyperlipidemia   . Migraine    Past Surgical History:  Procedure Laterality Date  . ABDOMINAL HYSTERECTOMY    . BREAST SURGERY    . EYE SURGERY    . hemorrhoid     Social History   Social History  . Marital status: Married    Spouse name: N/A  . Number of children: N/A  . Years of education: N/A   Social History Main Topics  . Smoking status: Never Smoker  . Smokeless tobacco: Never Used  . Alcohol use Yes  . Drug use:   . Sexual activity: Yes   Other Topics Concern  . None   Social History Narrative  . None   Allergies  Allergen Reactions  . Codeine   . Sulfa Antibiotics    Family History  Problem Relation Age of Onset  . Heart disease Brother   . Asthma Paternal Aunt   . Diabetes Paternal Aunt   . Stroke Paternal Uncle   . Kidney disease Paternal Uncle      Review of systems reviewed as of 09/19/16 Review of Systems: No headache, visual changes, nausea, vomiting, diarrhea,  constipation, dizziness, abdominal pain, skin rash, fevers, chills, night sweats, weight loss, swollen lymph nodes, body aches, joint swelling, muscle aches, chest pain, shortness of breath, mood changes.   Objective  Blood pressure 138/80, pulse (!) 50, height 5\' 7"  (1.702 m), weight 188 lb (85.3 kg), SpO2 98 %. Systems examined below as of 09/19/16   General: Patient is not under any distress. Alert and oriented 3.  HEENT: Pupils equal, extraocular movements intact  Respiratory: Patient's speak in full sentences and does not appear short of breath  Cardiovascular: No lower extremity edema Skin: Warm dry intact with no signs of infection or rash on extremities or on axial skeleton.  Abdomen: Soft nontender Neuro: Cranial nerves II through XII are intact, neurovascularly intact in all extremities with 2+ DTRs and 2+ pulses.  Lymph: No lymphadenopathy of posterior or anterior cervical chain or axillae bilaterally.  Gait normal with good balance and coordination.  MSK:  Non tender with full range of motion and good stability and symmetric strength and tone of shoulders,  wrist, hip, knee and ankles bilaterally.    Elbow: Right Unremarkable to inspection. Range of motion full pronation, supination, flexion, extension. Strength is full to all of the above directions Stable to varus, valgus stress. Negative moving valgus stress test. Nontender over the  form muscle itself Negative cubital tunnel Tinel's. Contralateral unremarkable Negative Tinel's at rest Contralateral elbow unremarkable    Impression and Recommendations:     This case required medical decision making of moderate complexity.      Note: This dictation was prepared with Dragon dictation along with smaller phrase technology. Any transcriptional errors that result from this process are unintentional.

## 2016-09-19 ENCOUNTER — Ambulatory Visit (INDEPENDENT_AMBULATORY_CARE_PROVIDER_SITE_OTHER): Payer: Medicare Other | Admitting: Family Medicine

## 2016-09-19 ENCOUNTER — Encounter: Payer: Self-pay | Admitting: Family Medicine

## 2016-09-19 DIAGNOSIS — M779 Enthesopathy, unspecified: Secondary | ICD-10-CM

## 2016-09-19 DIAGNOSIS — M778 Other enthesopathies, not elsewhere classified: Secondary | ICD-10-CM

## 2016-09-19 NOTE — Assessment & Plan Note (Signed)
Asian seems to be completely healed at this time. Can follow-up more on an as-needed basis. Encourage her to continue some mild strengthening exercises 2 times a week otherwise follow-up as needed.

## 2016-10-01 DIAGNOSIS — H524 Presbyopia: Secondary | ICD-10-CM | POA: Diagnosis not present

## 2016-10-01 DIAGNOSIS — H04123 Dry eye syndrome of bilateral lacrimal glands: Secondary | ICD-10-CM | POA: Diagnosis not present

## 2016-10-01 DIAGNOSIS — Z961 Presence of intraocular lens: Secondary | ICD-10-CM | POA: Diagnosis not present

## 2016-10-02 ENCOUNTER — Ambulatory Visit (INDEPENDENT_AMBULATORY_CARE_PROVIDER_SITE_OTHER)
Admission: RE | Admit: 2016-10-02 | Discharge: 2016-10-02 | Disposition: A | Discharge status: Home / Self Care | Payer: Medicare Other | Source: Ambulatory Visit | Attending: Internal Medicine | Admitting: Internal Medicine

## 2016-10-02 ENCOUNTER — Other Ambulatory Visit: Payer: Medicare Other

## 2016-10-02 ENCOUNTER — Ambulatory Visit (INDEPENDENT_AMBULATORY_CARE_PROVIDER_SITE_OTHER): Payer: Medicare Other | Admitting: Internal Medicine

## 2016-10-02 ENCOUNTER — Encounter: Payer: Self-pay | Admitting: Internal Medicine

## 2016-10-02 VITALS — BP 132/68 | HR 50 | Temp 98.3°F | Resp 12 | Ht 67.0 in | Wt 187.1 lb

## 2016-10-02 DIAGNOSIS — E2839 Other primary ovarian failure: Secondary | ICD-10-CM

## 2016-10-02 DIAGNOSIS — Z0000 Encounter for general adult medical examination without abnormal findings: Secondary | ICD-10-CM | POA: Diagnosis not present

## 2016-10-02 DIAGNOSIS — Z23 Encounter for immunization: Secondary | ICD-10-CM

## 2016-10-02 DIAGNOSIS — Z1159 Encounter for screening for other viral diseases: Secondary | ICD-10-CM

## 2016-10-02 LAB — HEPATITIS C ANTIBODY: HCV Ab: NEGATIVE

## 2016-10-02 NOTE — Progress Notes (Signed)
Pre visit review using our clinic review tool, if applicable. No additional management support is needed unless otherwise documented below in the visit note. 

## 2016-10-02 NOTE — Patient Instructions (Signed)
We have given you the tetanus shot today.   We will get the bone density done.   Health Maintenance, Female Introduction Adopting a healthy lifestyle and getting preventive care can go a long way to promote health and wellness. Talk with your health care provider about what schedule of regular examinations is right for you. This is a good chance for you to check in with your provider about disease prevention and staying healthy. In between checkups, there are plenty of things you can do on your own. Experts have done a lot of research about which lifestyle changes and preventive measures are most likely to keep you healthy. Ask your health care provider for more information. Weight and diet Eat a healthy diet  Be sure to include plenty of vegetables, fruits, low-fat dairy products, and lean protein.  Do not eat a lot of foods high in solid fats, added sugars, or salt.  Get regular exercise. This is one of the most important things you can do for your health.  Most adults should exercise for at least 150 minutes each week. The exercise should increase your heart rate and make you sweat (moderate-intensity exercise).  Most adults should also do strengthening exercises at least twice a week. This is in addition to the moderate-intensity exercise. Maintain a healthy weight  Body mass index (BMI) is a measurement that can be used to identify possible weight problems. It estimates body fat based on height and weight. Your health care provider can help determine your BMI and help you achieve or maintain a healthy weight.  For females 8 years of age and older:  A BMI below 18.5 is considered underweight.  A BMI of 18.5 to 24.9 is normal.  A BMI of 25 to 29.9 is considered overweight.  A BMI of 30 and above is considered obese. Watch levels of cholesterol and blood lipids  You should start having your blood tested for lipids and cholesterol at 67 years of age, then have this test every 5  years.  You may need to have your cholesterol levels checked more often if:  Your lipid or cholesterol levels are high.  You are older than 66 years of age.  You are at high risk for heart disease. Cancer screening Lung Cancer  Lung cancer screening is recommended for adults 57-70 years old who are at high risk for lung cancer because of a history of smoking.  A yearly low-dose CT scan of the lungs is recommended for people who:  Currently smoke.  Have quit within the past 15 years.  Have at least a 30-pack-year history of smoking. A pack year is smoking an average of one pack of cigarettes a day for 1 year.  Yearly screening should continue until it has been 15 years since you quit.  Yearly screening should stop if you develop a health problem that would prevent you from having lung cancer treatment. Breast Cancer  Practice breast self-awareness. This means understanding how your breasts normally appear and feel.  It also means doing regular breast self-exams. Let your health care provider know about any changes, no matter how small.  If you are in your 20s or 30s, you should have a clinical breast exam (CBE) by a health care provider every 1-3 years as part of a regular health exam.  If you are 74 or older, have a CBE every year. Also consider having a breast X-ray (mammogram) every year.  If you have a family history of breast cancer,  talk to your health care provider about genetic screening.  If you are at high risk for breast cancer, talk to your health care provider about having an MRI and a mammogram every year.  Breast cancer gene (BRCA) assessment is recommended for women who have family members with BRCA-related cancers. BRCA-related cancers include:  Breast.  Ovarian.  Tubal.  Peritoneal cancers.  Results of the assessment will determine the need for genetic counseling and BRCA1 and BRCA2 testing. Cervical Cancer  Your health care provider may recommend  that you be screened regularly for cancer of the pelvic organs (ovaries, uterus, and vagina). This screening involves a pelvic examination, including checking for microscopic changes to the surface of your cervix (Pap test). You may be encouraged to have this screening done every 3 years, beginning at age 50.  For women ages 78-65, health care providers may recommend pelvic exams and Pap testing every 3 years, or they may recommend the Pap and pelvic exam, combined with testing for human papilloma virus (HPV), every 5 years. Some types of HPV increase your risk of cervical cancer. Testing for HPV may also be done on women of any age with unclear Pap test results.  Other health care providers may not recommend any screening for nonpregnant women who are considered low risk for pelvic cancer and who do not have symptoms. Ask your health care provider if a screening pelvic exam is right for you.  If you have had past treatment for cervical cancer or a condition that could lead to cancer, you need Pap tests and screening for cancer for at least 20 years after your treatment. If Pap tests have been discontinued, your risk factors (such as having a new sexual partner) need to be reassessed to determine if screening should resume. Some women have medical problems that increase the chance of getting cervical cancer. In these cases, your health care provider may recommend more frequent screening and Pap tests. Colorectal Cancer  This type of cancer can be detected and often prevented.  Routine colorectal cancer screening usually begins at 66 years of age and continues through 66 years of age.  Your health care provider may recommend screening at an earlier age if you have risk factors for colon cancer.  Your health care provider may also recommend using home test kits to check for hidden blood in the stool.  A small camera at the end of a tube can be used to examine your colon directly (sigmoidoscopy or  colonoscopy). This is done to check for the earliest forms of colorectal cancer.  Routine screening usually begins at age 62.  Direct examination of the colon should be repeated every 5-10 years through 66 years of age. However, you may need to be screened more often if early forms of precancerous polyps or small growths are found. Skin Cancer  Check your skin from head to toe regularly.  Tell your health care provider about any new moles or changes in moles, especially if there is a change in a mole's shape or color.  Also tell your health care provider if you have a mole that is larger than the size of a pencil eraser.  Always use sunscreen. Apply sunscreen liberally and repeatedly throughout the day.  Protect yourself by wearing long sleeves, pants, a wide-brimmed hat, and sunglasses whenever you are outside. Heart disease, diabetes, and high blood pressure  High blood pressure causes heart disease and increases the risk of stroke. High blood pressure is more likely to  develop in:  People who have blood pressure in the high end of the normal range (130-139/85-89 mm Hg).  People who are overweight or obese.  People who are African American.  If you are 44-55 years of age, have your blood pressure checked every 3-5 years. If you are 85 years of age or older, have your blood pressure checked every year. You should have your blood pressure measured twice-once when you are at a hospital or clinic, and once when you are not at a hospital or clinic. Record the average of the two measurements. To check your blood pressure when you are not at a hospital or clinic, you can use:  An automated blood pressure machine at a pharmacy.  A home blood pressure monitor.  If you are between 29 years and 29 years old, ask your health care provider if you should take aspirin to prevent strokes.  Have regular diabetes screenings. This involves taking a blood sample to check your fasting blood sugar  level.  If you are at a normal weight and have a low risk for diabetes, have this test once every three years after 66 years of age.  If you are overweight and have a high risk for diabetes, consider being tested at a younger age or more often. Preventing infection Hepatitis B  If you have a higher risk for hepatitis B, you should be screened for this virus. You are considered at high risk for hepatitis B if:  You were born in a country where hepatitis B is common. Ask your health care provider which countries are considered high risk.  Your parents were born in a high-risk country, and you have not been immunized against hepatitis B (hepatitis B vaccine).  You have HIV or AIDS.  You use needles to inject street drugs.  You live with someone who has hepatitis B.  You have had sex with someone who has hepatitis B.  You get hemodialysis treatment.  You take certain medicines for conditions, including cancer, organ transplantation, and autoimmune conditions. Hepatitis C  Blood testing is recommended for:  Everyone born from [birth year] through 1966.  Anyone with known risk factors for hepatitis C. Sexually transmitted infections (STIs)  You should be screened for sexually transmitted infections (STIs) including gonorrhea and chlamydia if:  You are sexually active and are younger than 66 years of age.  You are older than 65 years of age and your health care provider tells you that you are at risk for this type of infection.  Your sexual activity has changed since you were last screened and you are at an increased risk for chlamydia or gonorrhea. Ask your health care provider if you are at risk.  If you do not have HIV, but are at risk, it may be recommended that you take a prescription medicine daily to prevent HIV infection. This is called pre-exposure prophylaxis (PrEP). You are considered at risk if:  You are sexually active and do not regularly use condoms or know the HIV status  of your partner(s).  You take drugs by injection.  You are sexually active with a partner who has HIV. Talk with your health care provider about whether you are at high risk of being infected with HIV. If you choose to begin PrEP, you should first be tested for HIV. You should then be tested every 3 months for as long as you are taking PrEP. Pregnancy  If you are premenopausal and you may become pregnant, ask your health  care provider about preconception counseling.  If you may become pregnant, take 400 to 800 micrograms (mcg) of folic acid every day.  If you want to prevent pregnancy, talk to your health care provider about birth control (contraception). Osteoporosis and menopause  Osteoporosis is a disease in which the bones lose minerals and strength with aging. This can result in serious bone fractures. Your risk for osteoporosis can be identified using a bone density scan.  If you are 56 years of age or older, or if you are at risk for osteoporosis and fractures, ask your health care provider if you should be screened.  Ask your health care provider whether you should take a calcium or vitamin D supplement to lower your risk for osteoporosis.  Menopause may have certain physical symptoms and risks.  Hormone replacement therapy may reduce some of these symptoms and risks. Talk to your health care provider about whether hormone replacement therapy is right for you. Follow these instructions at home:  Schedule regular health, dental, and eye exams.  Stay current with your immunizations.  Do not use any tobacco products including cigarettes, chewing tobacco, or electronic cigarettes.  If you are pregnant, do not drink alcohol.  If you are breastfeeding, limit how much and how often you drink alcohol.  Limit alcohol intake to no more than 1 drink per day for nonpregnant women. One drink equals 12 ounces of beer, 5 ounces of wine, or 1 ounces of hard liquor.  Do not use street  drugs.  Do not share needles.  Ask your health care provider for help if you need support or information about quitting drugs.  Tell your health care provider if you often feel depressed.  Tell your health care provider if you have ever been abused or do not feel safe at home. This information is not intended to replace advice given to you by your health care provider. Make sure you discuss any questions you have with your health care provider. Document Released: 04/23/2011 Document Revised: 03/15/2016 Document Reviewed: 07/12/2015  2017 Elsevier

## 2016-10-02 NOTE — Progress Notes (Signed)
   Subjective:    Patient ID: Jessica [name], female    DOB: 1951-05-02, 65 y.o.   MRN: [id number]  HPI Here for medicare wellness, no new complaints. Please see A/P for status and treatment of chronic medical problems.   Diet: heart healthy  Physical activity: walks daily Depression/mood screen: negative Hearing: intact to whispered voice Visual acuity: grossly normal with lens, performs annual eye exam  ADLs: capable Fall risk: none Home safety: good Cognitive evaluation: intact to orientation, naming, recall and repetition EOL planning: adv directives discussed, not in place  I have personally reviewed and have noted 1. The patient's medical and social history - reviewed today no changes 2. Their use of alcohol, tobacco or illicit drugs 3. Their current medications and supplements 4. The patient's functional ability including ADL's, fall risks, home safety risks and hearing or visual impairment. 5. Diet and physical activities 6. Evidence for depression or mood disorders 7. Care team reviewed and updated (available in snapshot)  Review of Systems  Constitutional: Negative.   HENT: Negative.   Eyes: Negative.   Respiratory: Negative for cough, chest tightness and shortness of breath.   Cardiovascular: Negative for chest pain, palpitations and leg swelling.  Gastrointestinal: Negative for abdominal distention, abdominal pain, constipation, diarrhea, nausea and vomiting.  Musculoskeletal: Negative.   Skin: Negative.   Neurological: Negative.   Psychiatric/Behavioral: Negative.       Objective:   Physical Exam  Constitutional: She is oriented to person, place, and time. She appears well-developed and well-nourished.  HENT:  Head: Normocephalic and atraumatic.  Eyes: EOM are normal.  Neck: Normal range of motion.  Cardiovascular: Normal rate and regular rhythm.   Pulmonary/Chest: Effort normal and breath sounds normal. No respiratory distress. She has no wheezes. She has  no rales.  Abdominal: Soft. Bowel sounds are normal. She exhibits no distension. There is no tenderness. There is no rebound.  Musculoskeletal: She exhibits no edema.  Neurological: She is alert and oriented to person, place, and time. Coordination normal.  Skin: Skin is warm and dry.  Psychiatric: She has a normal mood and affect.   Vitals:   10/02/16 1020  BP: 132/68  Pulse: (!) 50  Resp: 12  Temp: 98.3 F (36.8 C)  TempSrc: Oral  SpO2: 97%  Weight: 187 lb 1.9 oz (84.9 kg)  Height: 5\' 7"  (1.702 m)      Assessment & Plan:  Tdap given at visit.

## 2016-10-02 NOTE — Assessment & Plan Note (Signed)
Given tdap today. Flu and pneumonia given last visit. Needs another pneumonia next year. Colonoscopy and mammogram up to date. Aged out of cervical cancer screening. Counseled on sun safety and mole surveillance as well as dangers of distracted driving and advanced directives. Given 10 year screening recommendations.

## 2016-11-06 ENCOUNTER — Ambulatory Visit (INDEPENDENT_AMBULATORY_CARE_PROVIDER_SITE_OTHER): Payer: Medicare Other | Admitting: Internal Medicine

## 2016-11-06 ENCOUNTER — Encounter: Payer: Self-pay | Admitting: Internal Medicine

## 2016-11-06 DIAGNOSIS — L210 Seborrhea capitis: Secondary | ICD-10-CM | POA: Insufficient documentation

## 2016-11-06 DIAGNOSIS — L219 Seborrheic dermatitis, unspecified: Secondary | ICD-10-CM | POA: Diagnosis not present

## 2016-11-06 MED ORDER — SELENIUM SULFIDE 2.25 % EX SHAM: 1.0000 | MEDICATED_SHAMPOO | Freq: Every day | CUTANEOUS | 6 refills | Status: DC

## 2016-11-06 NOTE — Progress Notes (Signed)
Pre visit review using our clinic review tool, if applicable. No additional management support is needed unless otherwise documented below in the visit note. 

## 2016-11-06 NOTE — Progress Notes (Signed)
   Subjective:    Patient ID: Jessica [name], female    DOB: [birth year], 65 y.o.   MRN: [id number]  HPI The patient is a 66 YO female coming in for scalp irritation. Started about 1 week ago. Itchy and not painful. She is trying not to scratch. There is a patch of it which she feels is spreading. No fevers or chills. No new products used on her hair or dyes. Had not gotten her hair done right before this started.   Review of Systems  Constitutional: Negative.   Respiratory: Negative.   Cardiovascular: Negative.   Gastrointestinal: Negative.   Musculoskeletal: Negative.   Skin: Positive for color change. Negative for pallor, rash and wound.      Objective:   Physical Exam  Constitutional: She is oriented to person, place, and time. She appears well-developed and well-nourished.  HENT:  Head: Normocephalic and atraumatic.  Eyes: EOM are normal.  Neck: Normal range of motion.  Cardiovascular: Normal rate and regular rhythm.   Pulmonary/Chest: Effort normal and breath sounds normal. No respiratory distress. She has no wheezes. She has no rales.  Abdominal: Soft. Bowel sounds are normal. She exhibits no distension. There is no tenderness. There is no rebound.  Musculoskeletal: She exhibits no edema.  Neurological: She is alert and oriented to person, place, and time. Coordination normal.  Skin: Skin is warm and dry.  Some patches of dermatitis on the scalp without open sores, along the scalp line.    Vitals:   11/06/16 0751  BP: 134/80  Pulse: (!) 57  Resp: 14  Temp: 98.4 F (36.9 C)  TempSrc: Oral  SpO2: 98%  Weight: 190 lb (86.2 kg)  Height: 5\' 7"  (1.702 m)      Assessment & Plan:

## 2016-11-06 NOTE — Patient Instructions (Addendum)
We have sent in the shampoo to use on the scalp daily. Apply to shampoo and leave for 10 minutes then rinse out of the scalp.   It is also okay to try taking biotin which helps with hair and nail strength. This is an over the counter vitamin.   Seborrheic Dermatitis, Adult Seborrheic dermatitis is a skin disease that causes red, scaly patches. It usually occurs on the scalp, and it is often called dandruff. The patches may appear on other parts of the body. Skin patches tend to appear where there are many oil glands in the skin. Areas of the body that are commonly affected include:  Scalp.  Skin folds of the body.  Ears.  Eyebrows.  Neck.  Face.  Armpits.  The bearded area of men's faces. The condition may come and go for no known reason, and it is often long-lasting (chronic). What are the causes? The cause of this condition is not known. What increases the risk? This condition is more likely to develop in people who:  Have certain conditions, such as:  HIV (human immunodeficiency virus).  AIDS (acquired immunodeficiency syndrome).  Parkinson disease.  Mood disorders, such as depression.  Are 15-82 years old. What are the signs or symptoms? Symptoms of this condition include:  Thick scales on the scalp.  Redness on the face or in the armpits.  Skin that is flaky. The flakes may be white or yellow.  Skin that seems oily or dry but is not helped with moisturizers.  Itching or burning in the affected areas. How is this diagnosed? This condition is diagnosed with a medical history and physical exam. A sample of your skin may be tested (skin biopsy). You may need to see a skin specialist (dermatologist). How is this treated? There is no cure for this condition, but treatment can help to manage the symptoms. You may get treatment to remove scales, lower the risk of skin infection, and reduce swelling or itching. Treatment may include:  Creams that reduce swelling  and irritation (steroids).  Creams that reduce skin yeast.  Medicated shampoo, soaps, moisturizing creams, or ointments.  Medicated moisturizing creams or ointments. Follow these instructions at home:  Apply over-the-counter and prescription medicines only as told by your health care provider.  Use any medicated shampoo, soaps, skin creams, or ointments only as told by your health care provider.  Keep all follow-up visits as told by your health care provider. This is important. Contact a health care provider if:  Your symptoms do not improve with treatment.  Your symptoms get worse.  You have new symptoms. This information is not intended to replace advice given to you by your health care provider. Make sure you discuss any questions you have with your health care provider. Document Released: 10/08/2005 Document Revised: 04/27/2016 Document Reviewed: 01/26/2016 Elsevier Interactive Patient Education  2017 [employer].

## 2016-11-06 NOTE — Assessment & Plan Note (Signed)
Rx for selenium sulfide shampoo to use on the area and information given.

## 2016-11-07 ENCOUNTER — Encounter: Payer: Self-pay | Admitting: Internal Medicine

## 2017-01-08 NOTE — Progress Notes (Signed)
[name] [place], [place] [address] Phone: [phone] Subjective:     CC: low back pain   [id number]  Jessica [name] is a 66 y.o. female coming in with complaint of Low back pain. Patient states that the pain seems to be worse in the mornings. Patient denies any fevers, chills, any, weight loss. Patient does have past medical history significant for breast cancer. Patient tries to stay active and is finding it a little difficult secondary to the tightness in the lower back. Denies any radiation of pain. Denies any numbness or weakness. Patient rates the severity pain as 5 out of 10. Has not tried many home modalities for it at this time.     Past Medical History:  Diagnosis Date  . Allergy   . Cancer [facility])    breast  . Colon polyp   . GERD (gastroesophageal reflux disease)   . Hyperlipidemia   . Migraine    Past Surgical History:  Procedure Laterality Date  . ABDOMINAL HYSTERECTOMY    . BREAST SURGERY    . EYE SURGERY    . hemorrhoid     Social History   Social History  . Marital status: Married    Spouse name: N/A  . Number of children: N/A  . Years of education: N/A   Social History Main Topics  . Smoking status: Never Smoker  . Smokeless tobacco: Never Used  . Alcohol use Yes  . Drug use: Yes  . Sexual activity: Yes   Other Topics Concern  . None   Social History Narrative  . None   Allergies  Allergen Reactions  . Codeine   . Sulfa Antibiotics   . Sulfacetamide     Other reaction(s): Other (See Comments) swelling   Family History  Problem Relation Age of Onset  . Heart disease Brother   . Asthma Paternal Aunt   . Diabetes Paternal Aunt   . Stroke Paternal Uncle   . Kidney disease Paternal Uncle     Past medical history, social, surgical and family history all reviewed in electronic medical record.  No pertanent information unless stated regarding to the chief complaint.   Review of  Systems:Review of systems updated and as accurate as of 01/09/17  No headache, visual changes, nausea, vomiting, diarrhea, constipation, dizziness, abdominal pain, skin rash, fevers, chills, night sweats, weight loss, swollen lymph nodes,chest pain, shortness of breath, mood changes. Positive muscle aches and body aches   Objective  Blood pressure 128/86, pulse 60, height 5\' 7"  (1.702 m), weight 190 lb 12.8 oz (86.5 kg), SpO2 97 %. Systems examined below as of 01/09/17   General: No apparent distress alert and oriented x3 mood and affect normal, dressed appropriately.  HEENT: Pupils equal, extraocular movements intact  Respiratory: Patient's speak in full sentences and does not appear short of breath  Cardiovascular: No lower extremity edema, non tender, no erythema  Skin: Warm dry intact with no signs of infection or rash on extremities or on axial skeleton.  Abdomen: Soft nontender  Neuro: Cranial nerves II through XII are intact, neurovascularly intact in all extremities with 2+ DTRs and 2+ pulses.  Lymph: No lymphadenopathy of posterior or anterior cervical chain or axillae bilaterally.  Gait normal with good balance and coordination.  MSK:  Non tender with full range of motion and good stability and symmetric strength and tone of shoulders, elbows, wrist, hip, knee and ankles bilaterally. Mild arthritic changes of multiple joints  Back Exam:  Inspection: Mild loss in lordosis Motion: Flexion 35 deg, Extension 25 deg, Side Bending to 35 deg bilaterally,  Rotation to 35 deg bilaterally  SLR laying: Negative  XSLR laying: Negative  Palpable tenderness: Mild tenderness to palpation of the paraspinal musculature lumbar spine right greater than left.[name] FABER: Tightness bilaterally. Sensory change: Gross sensation intact to all lumbar and sacral dermatomes.  Reflexes: 2+ at both patellar tendons, 2+ at achilles tendons, Babinski's downgoing.  Strength at foot  4+ out of 5 strength but  symmetric  Procedure note 97110; 15 minutes spent for Therapeutic exercises as stated in above notes.  This included exercises focusing on stretching, strengthening, with significant focus on eccentric aspects. Low back exercises that included:  Pelvic tilt/bracing instruction to focus on control of the pelvic girdle and lower abdominal muscles  Glute strengthening exercises, focusing on proper firing of the glutes without engaging the low back muscles Proper stretching techniques for maximum relief for the hamstrings, hip flexors, low back and some rotation where tolerated    Proper technique shown and discussed handout in great detail with ATC.  All questions were discussed and answered.      Impression and Recommendations:     This case required medical decision making of moderate complexity.      Note: This dictation was prepared with Dragon dictation along with smaller phrase technology. Any transcriptional errors that result from this process are unintentional.

## 2017-01-09 ENCOUNTER — Ambulatory Visit (INDEPENDENT_AMBULATORY_CARE_PROVIDER_SITE_OTHER)
Admission: RE | Admit: 2017-01-09 | Discharge: 2017-01-09 | Disposition: A | Discharge status: Home / Self Care | Payer: Medicare Other | Source: Ambulatory Visit | Attending: Family Medicine | Admitting: Family Medicine

## 2017-01-09 ENCOUNTER — Encounter: Payer: Self-pay | Admitting: Family Medicine

## 2017-01-09 ENCOUNTER — Ambulatory Visit (INDEPENDENT_AMBULATORY_CARE_PROVIDER_SITE_OTHER): Payer: Medicare Other | Admitting: Family Medicine

## 2017-01-09 VITALS — BP 128/86 | HR 60 | Ht 67.0 in | Wt 190.8 lb

## 2017-01-09 DIAGNOSIS — M545 Low back pain: Secondary | ICD-10-CM

## 2017-01-09 DIAGNOSIS — M479 Spondylosis, unspecified: Secondary | ICD-10-CM | POA: Insufficient documentation

## 2017-01-09 MED ORDER — VITAMIN D (ERGOCALCIFEROL) 1.25 MG (50000 UNIT) PO CAPS: 50000.0000 [IU] | ORAL_CAPSULE | ORAL | 0 refills | Status: DC

## 2017-01-09 NOTE — Patient Instructions (Signed)
Good to see you  Ice 20 minutes 2 times daily. Usually after activity and before bed. Exercises 3 times a week.  Once  Weekly vitamin D for 12 weeks.  Stay active Rotate mattress or go shopping for a new one.  See me again in 4 weeks.

## 2017-01-09 NOTE — Assessment & Plan Note (Signed)
I do believe the patient does have degenerative joint disease. Patient will have x-rays today. Past medical history significant for breast cancer. Pain seems to be worse in the morning and we discussed potential changing the mattress. Once weekly vitamin D given, topical anti-inflammatories as needed. We discussed icing regimen. Increase activity slowly. Home exercises and patient work with Product/process development scientist. Follow-up again with me in 4 weeks

## 2017-01-25 ENCOUNTER — Ambulatory Visit: Payer: Medicare Other | Admitting: Internal Medicine

## 2017-05-31 ENCOUNTER — Other Ambulatory Visit: Payer: Self-pay | Admitting: Internal Medicine

## 2017-05-31 DIAGNOSIS — Z1231 Encounter for screening mammogram for malignant neoplasm of breast: Secondary | ICD-10-CM

## 2017-06-05 ENCOUNTER — Encounter: Payer: Self-pay | Admitting: Family Medicine

## 2017-06-05 ENCOUNTER — Ambulatory Visit (INDEPENDENT_AMBULATORY_CARE_PROVIDER_SITE_OTHER): Payer: Medicare Other | Admitting: Family Medicine

## 2017-06-05 DIAGNOSIS — M25522 Pain in left elbow: Secondary | ICD-10-CM | POA: Diagnosis not present

## 2017-06-05 NOTE — Patient Instructions (Addendum)
Thank you for coming in,   You can try compression sleeve in this area. You can apply ice as well. I have provided exercises that he can try. If he did not have any improvement in a couple weeks and please follow-up with me.   Please feel free to call with any questions or concerns at any time, at [phone]. --Dr. Raeford Razor

## 2017-06-05 NOTE — Assessment & Plan Note (Signed)
Possible for lateral epicondylitis but does not have significant pain to palpation over lateral, bowel. Possible for her muscle strength is area versus supinator syndrome. Does not have of radicular symptoms suggestive nerve entrapment. Ultrasound exam was unrevealing currently. This is still acute in nature. - Discussed prescription strength anti-inflammatory's she can take Aleve over-the-counter for now. - Provided home exercise therapy. - She'll follow-up a couple weeks and if no improvement consider an injection versus x-rays at that time.

## 2017-06-05 NOTE — Progress Notes (Signed)
Jessica [name] - 66 y.o. female MRN [id number]  Date of birth: 01/24/1951  SUBJECTIVE:  Including CC & ROS.  Chief Complaint  Patient presents with  . Arm Pain    x1week constant 6/10 pain around elbow tylenol did not help bending arm in certain position makes it feel worse   Jessica [name] is a 66 year old femalepresenting with left lateral elbow pain. The pain started about a week ago. She denies any trauma or injury to it. The pain is localized and constant in nature. She has not tried wrapping or any exercises. She has taken Tylenol for the pain was little no improvement. The pain is unchanging or getting worse. She denies any radicular type symptoms. She denies any prior history of similar symptoms. The pain is worse when her elbow is in flexion or when she is using the computer.     Review of Systems  Musculoskeletal: Positive for myalgias. Negative for joint swelling.  Skin: Negative for rash.  Neurological: Negative for weakness and numbness.  Hematological: Does not bruise/bleed easily.   otherwise negative  HISTORY: Past Medical, Surgical, Social, and Family History Reviewed & Updated per EMR.   Pertinent Historical Findings include:  Past Medical History:  Diagnosis Date  . Allergy   . Cancer [facility])    breast  . Colon polyp   . GERD (gastroesophageal reflux disease)   . Hyperlipidemia   . Migraine     Past Surgical History:  Procedure Laterality Date  . ABDOMINAL HYSTERECTOMY    . BREAST SURGERY    . EYE SURGERY    . hemorrhoid      Allergies  Allergen Reactions  . Codeine   . Sulfa Antibiotics   . Sulfacetamide     Other reaction(s): Other (See Comments) swelling    Family History  Problem Relation Age of Onset  . Heart disease Brother   . Asthma Paternal Aunt   . Diabetes Paternal Aunt   . Stroke Paternal Uncle   . Kidney disease Paternal Uncle      Social History   Social History  . Marital status: Married    Spouse name: N/A  . Number of  children: N/A  . Years of education: N/A   Occupational History  . Not on file.   Social History Main Topics  . Smoking status: Never Smoker  . Smokeless tobacco: Never Used  . Alcohol use Yes  . Drug use: Yes  . Sexual activity: Yes   Other Topics Concern  . Not on file   Social History Narrative  . No narrative on file     PHYSICAL EXAM:  VS: BP 140/82 (BP Location: Left Arm, Patient Position: Sitting, Cuff Size: Normal)   Pulse (!) 51   Temp 98.1 F (36.7 C) (Oral)   Ht 5\' 7"  (1.702 m)   Wt 190 lb (86.2 kg)   SpO2 100%   BMI 29.76 kg/m  Physical Exam Gen: NAD, alert, cooperative with exam, well-appearing ENT: normal lips, normal nasal mucosa,  Eye: normal EOM, normal conjunctiva and lids CV:  no edema, +2 pedal pulses   Resp: no accessory muscle use, non-labored,  GI: no masses or tenderness, no hernia  Skin: no rashes, no areas of induration  Neuro: normal tone, normal sensation to touch Psych:  normal insight, alert and oriented MSK:  Left elbow: No significant tenderness to the lateral epicondyle. No tenderness to the medial epicondyle. Normal elbow extension and flexion. Some tender to palpation over the proximal  common extensors  Normal strength to resistance in the upper extremity. No pain with finger extended to resistance. Minimal pain with resistance to supination. Neurovascularly intact.  Limited ultrasound: Left elbow:  Lateral epicondyles with no partial tear or increased vascular flow. There is no spurring on the lateral condyle. The supinator does not appear to be disrupted in any way  Summary: Normal exam  Ultrasound and interpretation by Clearance Coots, MD          ASSESSMENT & PLAN:   Left elbow pain Possible for lateral epicondylitis but does not have significant pain to palpation over lateral, bowel. Possible for her muscle strength is area versus supinator syndrome. Does not have of radicular symptoms suggestive nerve  entrapment. Ultrasound exam was unrevealing currently. This is still acute in nature. - Discussed prescription strength anti-inflammatory's she can take Aleve over-the-counter for now. - Provided home exercise therapy. - She'll follow-up a couple weeks and if no improvement consider an injection versus x-rays at that time.

## 2017-06-27 ENCOUNTER — Ambulatory Visit: Payer: Self-pay

## 2017-06-27 ENCOUNTER — Ambulatory Visit (INDEPENDENT_AMBULATORY_CARE_PROVIDER_SITE_OTHER): Payer: Medicare Other | Admitting: Family Medicine

## 2017-06-27 ENCOUNTER — Encounter: Payer: Self-pay | Admitting: Family Medicine

## 2017-06-27 VITALS — BP 138/82 | HR 53 | Ht 67.0 in | Wt 187.0 lb

## 2017-06-27 DIAGNOSIS — M7710 Lateral epicondylitis, unspecified elbow: Secondary | ICD-10-CM | POA: Insufficient documentation

## 2017-06-27 DIAGNOSIS — M7712 Lateral epicondylitis, left elbow: Secondary | ICD-10-CM

## 2017-06-27 DIAGNOSIS — M25522 Pain in left elbow: Secondary | ICD-10-CM

## 2017-06-27 DIAGNOSIS — Z23 Encounter for immunization: Secondary | ICD-10-CM

## 2017-06-27 NOTE — Progress Notes (Signed)
[name] [place], [place] [address] Phone: [phone] Subjective:    I'm seeing this patient by the request  of:    CC: left elbow pain   [id number]  Jessica [name] is a 66 y.o. female coming in with complaint of left elbow pain. Patient did see another provider. Was diagnosed with a potential for a lateral hip colitis. Patient was given some exercises and states that she had difficulty with understanding. Patient rates the severity of pain a 6 out of 10. Waking her up at night. Does not remember any specific injuries though.      Past Medical History:  Diagnosis Date  . Allergy   . Cancer [facility])    breast  . Colon polyp   . GERD (gastroesophageal reflux disease)   . Hyperlipidemia   . Migraine    Past Surgical History:  Procedure Laterality Date  . ABDOMINAL HYSTERECTOMY    . BREAST SURGERY    . EYE SURGERY    . hemorrhoid     Social History   Social History  . Marital status: Married    Spouse name: N/A  . Number of children: N/A  . Years of education: N/A   Social History Main Topics  . Smoking status: Never Smoker  . Smokeless tobacco: Never Used  . Alcohol use Yes  . Drug use: Yes  . Sexual activity: Yes   Other Topics Concern  . None   Social History Narrative  . None   Allergies  Allergen Reactions  . Codeine   . Sulfa Antibiotics   . Sulfacetamide     Other reaction(s): Other (See Comments) swelling   Family History  Problem Relation Age of Onset  . Heart disease Brother   . Asthma Paternal Aunt   . Diabetes Paternal Aunt   . Stroke Paternal Uncle   . Kidney disease Paternal Uncle      Past medical history, social, surgical and family history all reviewed in electronic medical record.  No pertanent information unless stated regarding to the chief complaint.   Review of Systems:Review of systems updated and as accurate as of 06/27/17  No headache, visual changes, nausea, vomiting,  diarrhea, constipation, dizziness, abdominal pain, skin rash, fevers, chills, night sweats, weight loss, swollen lymph nodes, body aches, joint swelling,  chest pain, shortness of breath, mood changes. Positive muscle aches  Objective  Blood pressure 138/82, pulse (!) 53, height 5\' 7"  (1.702 m), weight 187 lb (84.8 kg), SpO2 98 %. Systems examined below as of 06/27/17   General: No apparent distress alert and oriented x3 mood and affect normal, dressed appropriately.  HEENT: Pupils equal, extraocular movements intact  Respiratory: Patient's speak in full sentences and does not appear short of breath  Cardiovascular: No lower extremity edema, non tender, no erythema  Skin: Warm dry intact with no signs of infection or rash on extremities or on axial skeleton.  Abdomen: Soft nontender  Neuro: Cranial nerves II through XII are intact, neurovascularly intact in all extremities with 2+ DTRs and 2+ pulses.  Lymph: No lymphadenopathy of posterior or anterior cervical chain or axillae bilaterally.  Gait normal with good balance and coordination.  MSK:  Non tender with full range of motion and good stability and symmetric strength and tone of shoulders, wrist, hip, knee and ankles bilaterally.  Elbow: left Unremarkable to inspection. Range of motion full pronation, supination, flexion, extension. Strength is full to all of the above directions  Stable to varus, valgus stress. Negative moving valgus stress test. Tender over the lateral patellar region. Ulnar nerve does not sublux. Negative cubital tunnel Tinel's.  Musculoskeletal ultrasound was performed and interpreted by Charlann Boxer D.O.   Elbow:  Lateral epicondyle and common extensor tendon origin visualized. Patient does have hypoechoic changes. Patient does have what appears to be some mild intersubstance tearing and some increasing Doppler flow over the superficial aspect of the common extensor tendon.  IMPRESSION:  Partial tearing of the  lateral epicondylar region    Impression and Recommendations:     This case required medical decision making of moderate complexity.      Note: This dictation was prepared with Dragon dictation along with smaller phrase technology. Any transcriptional errors that result from this process are unintentional.

## 2017-06-27 NOTE — Assessment & Plan Note (Signed)
Patient does have a lateral epicondylitis. Given a wrist brace to avoid extension of the wrist. Home exercises given, topical anti-inflammatories, we discussed icing regimen. Patient will come back and see me again in 4 weeks for further evaluation.

## 2017-06-27 NOTE — Patient Instructions (Signed)
Good to see you.  Ice 20 minutes 2 times daily. Usually after activity and before bed. Exercises 3 times a week.  Wear brace day and night for 1 week then nightly for 2 weeks.  Read about PRP in case you need it but hopefully you will do great  See me again in 3 weeks.

## 2017-06-28 ENCOUNTER — Ambulatory Visit
Admission: RE | Admit: 2017-06-28 | Discharge: 2017-06-28 | Disposition: A | Discharge status: Home / Self Care | Payer: Medicare Other | Source: Ambulatory Visit | Attending: Internal Medicine | Admitting: Internal Medicine

## 2017-06-28 DIAGNOSIS — Z1231 Encounter for screening mammogram for malignant neoplasm of breast: Secondary | ICD-10-CM

## 2017-06-28 HISTORY — DX: Malignant neoplasm of unspecified site of unspecified female breast: C50.919

## 2017-06-28 HISTORY — DX: Personal history of irradiation: Z92.3

## 2017-07-18 ENCOUNTER — Ambulatory Visit (INDEPENDENT_AMBULATORY_CARE_PROVIDER_SITE_OTHER): Payer: Medicare Other | Admitting: Family Medicine

## 2017-07-18 ENCOUNTER — Encounter: Payer: Self-pay | Admitting: Family Medicine

## 2017-07-18 VITALS — BP 130/90 | HR 53 | Ht 66.0 in | Wt 190.0 lb

## 2017-07-18 DIAGNOSIS — M25522 Pain in left elbow: Secondary | ICD-10-CM | POA: Diagnosis not present

## 2017-07-18 DIAGNOSIS — G8929 Other chronic pain: Secondary | ICD-10-CM | POA: Diagnosis not present

## 2017-07-18 DIAGNOSIS — M7712 Lateral epicondylitis, left elbow: Secondary | ICD-10-CM | POA: Diagnosis not present

## 2017-07-18 MED ORDER — GABAPENTIN 100 MG PO CAPS: 200.0000 mg | ORAL_CAPSULE | Freq: Every day | ORAL | 3 refills | Status: DC

## 2017-07-18 NOTE — Patient Instructions (Signed)
Good to see you  PT will be calling you  Gabapentin at night 200 mg OK to not wear brace as much during the day  See em again in 3-4 weeks and consider PT

## 2017-07-18 NOTE — Progress Notes (Signed)
[name] [place], [place] [address] Phone: [phone] Subjective:    I'm seeing this patient by the request  of:    CC:   [id number]  Jessica [name] is a 66 y.o. female coming in with complaint of elbow pain. Patient explains they are still having some left elbow pain. She is wearing her wrist brace consistently.   Onset-  Location Duration-  Character- Aggravating factors- Reliving factors-  Therapies tried-  Severity-     Past Medical History:  Diagnosis Date  . Allergy   . Breast cancer ([place]) 2000  . Cancer [facility])    breast  . Colon polyp   . GERD (gastroesophageal reflux disease)   . Hyperlipidemia   . Migraine   . Personal history of radiation therapy 2000   Past Surgical History:  Procedure Laterality Date  . ABDOMINAL HYSTERECTOMY    . BREAST LUMPECTOMY Right 2000  . BREAST SURGERY    . EYE SURGERY    . hemorrhoid     Social History   Social History  . Marital status: Married    Spouse name: N/A  . Number of children: N/A  . Years of education: N/A   Social History Main Topics  . Smoking status: Never Smoker  . Smokeless tobacco: Never Used  . Alcohol use Yes  . Drug use: Yes  . Sexual activity: Yes   Other Topics Concern  . Not on file   Social History Narrative  . No narrative on file   Allergies  Allergen Reactions  . Codeine   . Sulfa Antibiotics   . Sulfacetamide     Other reaction(s): Other (See Comments) swelling   Family History  Problem Relation Age of Onset  . Heart disease Brother   . Asthma Paternal Aunt   . Diabetes Paternal Aunt   . Stroke Paternal Uncle   . Kidney disease Paternal Uncle      Past medical history, social, surgical and family history all reviewed in electronic medical record.  No pertanent information unless stated regarding to the chief complaint.   Review of Systems:Review of systems updated and as accurate as of 07/18/17  No headache, visual  changes, nausea, vomiting, diarrhea, constipation, dizziness, abdominal pain, skin rash, fevers, chills, night sweats, weight loss, swollen lymph nodes, body aches, joint swelling, muscle aches, chest pain, shortness of breath, mood changes.   Objective  There were no vitals taken for this visit. Systems examined below as of 07/18/17   General: No apparent distress alert and oriented x3 mood and affect normal, dressed appropriately.  HEENT: Pupils equal, extraocular movements intact  Respiratory: Patient's speak in full sentences and does not appear short of breath  Cardiovascular: No lower extremity edema, non tender, no erythema  Skin: Warm dry intact with no signs of infection or rash on extremities or on axial skeleton.  Abdomen: Soft nontender  Neuro: Cranial nerves II through XII are intact, neurovascularly intact in all extremities with 2+ DTRs and 2+ pulses.  Lymph: No lymphadenopathy of posterior or anterior cervical chain or axillae bilaterally.  Gait normal with good balance and coordination.  MSK:  Non tender with full range of motion and good stability and symmetric strength and tone of shoulders, elbows, wrist, hip, knee and ankles bilaterally.     Impression and Recommendations:     This case required medical decision making of moderate complexity.      Note: This dictation was prepared with  Dragon dictation along with smaller Company secretary. Any transcriptional errors that result from this process are unintentional.

## 2017-07-18 NOTE — Progress Notes (Signed)
[name] [place],  [address] Phone: [phone] Subjective:    I'm seeing this patient by the request  of:    CC: left elbow pain f/u   [id number]  Jessica [name] is a 66 y.o. female coming in with complaint of left elbow pain. Patient was found to have a lateral epicondylitis. Possible intersubstance tearing noted Patient was to do home exercises, icing regimen as well as bracing of the wrist. Has noticed no significant improvement.Patient states may be a proximal he 5% better. Having significant amount of discomfort still with anything outside a daily activities. Patient states that it feels stiff overall.      Past Medical History:  Diagnosis Date  . Allergy   . Breast cancer ([place]) 2000  . Cancer [facility])    breast  . Colon polyp   . GERD (gastroesophageal reflux disease)   . Hyperlipidemia   . Migraine   . Personal history of radiation therapy 2000   Past Surgical History:  Procedure Laterality Date  . ABDOMINAL HYSTERECTOMY    . BREAST LUMPECTOMY Right 2000  . BREAST SURGERY    . EYE SURGERY    . hemorrhoid     Social History   Social History  . Marital status: Married    Spouse name: N/A  . Number of children: N/A  . Years of education: N/A   Social History Main Topics  . Smoking status: Never Smoker  . Smokeless tobacco: Never Used  . Alcohol use Yes  . Drug use: Yes  . Sexual activity: Yes   Other Topics Concern  . None   Social History Narrative  . None   Allergies  Allergen Reactions  . Codeine   . Sulfa Antibiotics   . Sulfacetamide     Other reaction(s): Other (See Comments) swelling   Family History  Problem Relation Age of Onset  . Heart disease Brother   . Asthma Paternal Aunt   . Diabetes Paternal Aunt   . Stroke Paternal Uncle   . Kidney disease Paternal Uncle      Past medical history, social, surgical and family history all reviewed in electronic medical record.  No  pertanent information unless stated regarding to the chief complaint.   Review of Systems: No headache, visual changes, nausea, vomiting, diarrhea, constipation, dizziness, abdominal pain, skin rash, fevers, chills, night sweats, weight loss, swollen lymph nodes, body aches, joint swelling, muscle aches, chest pain, shortness of breath, mood changes.    Objective  Blood pressure 130/90, pulse (!) 53, height 5\' 6"  (1.676 m), weight 190 lb (86.2 kg), SpO2 94 %.   Systems examined below as of 07/18/17 General: NAD A&O x3 mood, affect normal  HEENT: Pupils equal, extraocular movements intact no nystagmus Respiratory: not short of breath at rest or with speaking Cardiovascular: No lower extremity edema, non tender Skin: Warm dry intact with no signs of infection or rash on extremities or on axial skeleton. Abdomen: Soft nontender, no masses Neuro: Cranial nerves  intact, neurovascularly intact in all extremities with 2+ DTRs and 2+ pulses. Lymph: No lymphadenopathy appreciated today  Gait normal with good balance and coordination.  MSK: Non tender with full range of motion and good stability and symmetric strength and tone of shoulders, wrist,  knee hips and ankles bilaterally.    Elbow:Left Unremarkable to inspection. Range of motion full pronation, supination, flexion, extension. Strength is full to all of the above directions Stable to varus, valgus  stress. Negative moving valgus stress test.  knee continued pain more over thelateral upper condylar region. Ulnar nerve does not sublux. Negative cubital tunnel Tinel's      Impression and Recommendations:     This case required medical decision making of moderate complexity.      Note: This dictation was prepared with Dragon dictation along with smaller phrase technology. Any transcriptional errors that result from this process are unintentional.

## 2017-07-18 NOTE — Assessment & Plan Note (Signed)
Including migraines previously. Patient will continue bracing only at night. Follow-up againin 4-8 weeks.

## 2017-08-08 ENCOUNTER — Encounter: Payer: Self-pay | Admitting: Family Medicine

## 2017-08-08 ENCOUNTER — Ambulatory Visit (INDEPENDENT_AMBULATORY_CARE_PROVIDER_SITE_OTHER): Payer: Medicare Other | Admitting: Family Medicine

## 2017-08-08 DIAGNOSIS — M7712 Lateral epicondylitis, left elbow: Secondary | ICD-10-CM

## 2017-08-08 NOTE — Assessment & Plan Note (Signed)
Significant improvement at this time. No change in management. Patient can discontinue with physical therapy. And see me as needed.

## 2017-08-08 NOTE — Progress Notes (Signed)
[name] [address] [place], [place] [address] Phone: [phone] Subjective:    I'm seeing this patient by the request  of:    CC: Left elbow pain follow-up  FGH:[id number]  [name] is a 66 y.o. female coming in with complaint of left elbow pain. Was seen previously. Lateral epicondylitis. Patient started with formal physical therapy and states that she is 99% better. Feeling good. Feels that he distended and the eyes. Able to do all daily activities. No trouble with range of motion.     Past Medical History:  Diagnosis Date  . Allergy   . Breast cancer ([place]) 2000  . Cancer [facility])    breast  . Colon polyp   . GERD (gastroesophageal reflux disease)   . Hyperlipidemia   . Migraine   . Personal history of radiation therapy 2000   Past Surgical History:  Procedure Laterality Date  . ABDOMINAL HYSTERECTOMY    . BREAST LUMPECTOMY Right 2000  . BREAST SURGERY    . EYE SURGERY    . hemorrhoid     Social History   Social History  . Marital status: Married    Spouse name: N/A  . Number of children: N/A  . Years of education: N/A   Social History Main Topics  . Smoking status: Never Smoker  . Smokeless tobacco: Never Used  . Alcohol use Yes  . Drug use: Yes  . Sexual activity: Yes   Other Topics Concern  . None   Social History Narrative  . None   Allergies  Allergen Reactions  . Codeine   . Sulfa Antibiotics   . Sulfacetamide     Other reaction(s): Other (See Comments) swelling   Family History  Problem Relation Age of Onset  . Heart disease Brother   . Asthma Paternal Aunt   . Diabetes Paternal Aunt   . Stroke Paternal Uncle   . Kidney disease Paternal Uncle      Past medical history, social, surgical and family history all reviewed in electronic medical record.  No pertanent information unless stated regarding to the chief complaint.   Review of Systems:Review of systems updated and as accurate as of  08/08/17  No headache, visual changes, nausea, vomiting, diarrhea, constipation, dizziness, abdominal pain, skin rash, fevers, chills, night sweats, weight loss, swollen lymph nodes, body aches, joint swelling, muscle aches, chest pain, shortness of breath, mood changes.   Objective  Blood pressure 120/70, pulse (!) 59, height 5\' 6"  (1.676 m), weight 190 lb (86.2 kg), SpO2 96 %. Systems examined below as of 08/08/17   General: No apparent distress alert and oriented x3 mood and affect normal, dressed appropriately.  HEENT: Pupils equal, extraocular movements intact  Respiratory: Patient's speak in full sentences and does not appear short of breath  Cardiovascular: No lower extremity edema, non tender, no erythema  Skin: Warm dry intact with no signs of infection or rash on extremities or on axial skeleton.  Abdomen: Soft nontender  Neuro: Cranial nerves II through XII are intact, neurovascularly intact in all extremities with 2+ DTRs and 2+ pulses.  Lymph: No lymphadenopathy of posterior or anterior cervical chain or axillae bilaterally.  Gait normal with good balance and coordination.  MSK:  Non tender with full range of motion and good stability and symmetric strength and tone of shoulders, wrist, hip, knee and ankles bilaterally.  Elbow: Left Unremarkable to inspection. Range of motion full pronation, supination, flexion, extension. Strength is full  to all of the above directions Stable to varus, valgus stress. Negative moving valgus stress test. No discrete areas of tenderness to palpation. Ulnar nerve does not sublux. Negative cubital tunnel Tinel's. Contralateral elbow unremarkable   Impression and Recommendations:     This case required medical decision making of moderate complexity.      Note: This dictation was prepared with Dragon dictation along with smaller phrase technology. Any transcriptional errors that result from this process are unintentional.

## 2017-09-16 ENCOUNTER — Other Ambulatory Visit (INDEPENDENT_AMBULATORY_CARE_PROVIDER_SITE_OTHER): Payer: Medicare Other

## 2017-09-16 ENCOUNTER — Encounter: Payer: Self-pay | Admitting: Internal Medicine

## 2017-09-16 ENCOUNTER — Ambulatory Visit (INDEPENDENT_AMBULATORY_CARE_PROVIDER_SITE_OTHER): Payer: Medicare Other | Admitting: Internal Medicine

## 2017-09-16 VITALS — BP 130/80 | HR 55 | Temp 98.5°F | Ht 65.5 in | Wt 189.5 lb

## 2017-09-16 DIAGNOSIS — Z0000 Encounter for general adult medical examination without abnormal findings: Secondary | ICD-10-CM

## 2017-09-16 DIAGNOSIS — Z23 Encounter for immunization: Secondary | ICD-10-CM

## 2017-09-16 LAB — CBC
HCT: 41.1 % (ref 36.0–46.0)
Hemoglobin: 13.1 g/dL (ref 12.0–15.0)
MCHC: 32 g/dL (ref 30.0–36.0)
MCV: 90.3 fl (ref 78.0–100.0)
Platelets: 220 10*3/uL (ref 150.0–400.0)
RBC: 4.55 Mil/uL (ref 3.87–5.11)
RDW: 13.7 % (ref 11.5–15.5)
WBC: 4.2 10*3/uL (ref 4.0–10.5)

## 2017-09-16 LAB — COMPREHENSIVE METABOLIC PANEL
ALBUMIN: 4.2 g/dL (ref 3.5–5.2)
ALT: 32 U/L (ref 0–35)
AST: 34 U/L (ref 0–37)
Alkaline Phosphatase: 54 U/L (ref 39–117)
BUN: 9 mg/dL (ref 6–23)
CHLORIDE: 104 meq/L (ref 96–112)
CO2: 27 meq/L (ref 19–32)
CREATININE: 1 mg/dL (ref 0.40–1.20)
Calcium: 9.7 mg/dL (ref 8.4–10.5)
GFR: 71.11 mL/min (ref >60.00)
GLUCOSE: 96 mg/dL (ref 70–99)
Potassium: 4.3 mEq/L (ref 3.5–5.1)
SODIUM: 139 meq/L (ref 135–145)
Total Bilirubin: 0.4 mg/dL (ref 0.2–1.2)
Total Protein: 7.9 g/dL (ref 6.0–8.3)

## 2017-09-16 LAB — LIPID PANEL
CHOL/HDL RATIO: 7
Cholesterol: 245 mg/dL — ABNORMAL HIGH (ref 0–200)
HDL: 37.7 mg/dL — ABNORMAL LOW (ref >39.00)
LDL CALC: 181 mg/dL — AB (ref 0–99)
NonHDL: 207.79
Triglycerides: 136 mg/dL (ref 0.0–149.0)
VLDL: 27.2 mg/dL (ref 0.0–40.0)

## 2017-09-16 LAB — HEMOGLOBIN A1C: HEMOGLOBIN A1C: 6 % (ref 4.6–6.5)

## 2017-09-16 NOTE — Patient Instructions (Signed)
We have given you the pneumonia shot today.  We are checking the labs.  Come back Dec 13th or later with our health coach for the wellness visit.  Health Maintenance, Female Adopting a healthy lifestyle and getting preventive care can go a long way to promote health and wellness. Talk with your health care provider about what schedule of regular examinations is right for you. This is a good chance for you to check in with your provider about disease prevention and staying healthy. In between checkups, there are plenty of things you can do on your own. Experts have done a lot of research about which lifestyle changes and preventive measures are most likely to keep you healthy. Ask your health care provider for more information. Weight and diet Eat a healthy diet  Be sure to include plenty of vegetables, fruits, low-fat dairy products, and lean protein.  Do not eat a lot of foods high in solid fats, added sugars, or salt.  Get regular exercise. This is one of the most important things you can do for your health. ? Most adults should exercise for at least 150 minutes each week. The exercise should increase your heart rate and make you sweat (moderate-intensity exercise). ? Most adults should also do strengthening exercises at least twice a week. This is in addition to the moderate-intensity exercise.  Maintain a healthy weight  Body mass index (BMI) is a measurement that can be used to identify possible weight problems. It estimates body fat based on height and weight. Your health care provider can help determine your BMI and help you achieve or maintain a healthy weight.  For females 11 years of age and older: ? A BMI below 18.5 is considered underweight. ? A BMI of 18.5 to 24.9 is normal. ? A BMI of 25 to 29.9 is considered overweight. ? A BMI of 30 and above is considered obese.  Watch levels of cholesterol and blood lipids  You should start having your blood tested for lipids and  cholesterol at 67 years of age, then have this test every 5 years.  You may need to have your cholesterol levels checked more often if: ? Your lipid or cholesterol levels are high. ? You are older than 67 years of age. ? You are at high risk for heart disease.  Cancer screening Lung Cancer  Lung cancer screening is recommended for adults 29-89 years old who are at high risk for lung cancer because of a history of smoking.  A yearly low-dose CT scan of the lungs is recommended for people who: ? Currently smoke. ? Have quit within the past 15 years. ? Have at least a 30-pack-year history of smoking. A pack year is smoking an average of one pack of cigarettes a day for 1 year.  Yearly screening should continue until it has been 15 years since you quit.  Yearly screening should stop if you develop a health problem that would prevent you from having lung cancer treatment.  Breast Cancer  Practice breast self-awareness. This means understanding how your breasts normally appear and feel.  It also means doing regular breast self-exams. Let your health care provider know about any changes, no matter how small.  If you are in your 20s or 30s, you should have a clinical breast exam (CBE) by a health care provider every 1-3 years as part of a regular health exam.  If you are 56 or older, have a CBE every year. Also consider having a breast  X-ray (mammogram) every year.  If you have a family history of breast cancer, talk to your health care provider about genetic screening.  If you are at high risk for breast cancer, talk to your health care provider about having an MRI and a mammogram every year.  Breast cancer gene (BRCA) assessment is recommended for women who have family members with BRCA-related cancers. BRCA-related cancers include: ? Breast. ? Ovarian. ? Tubal. ? Peritoneal cancers.  Results of the assessment will determine the need for genetic counseling and BRCA1 and BRCA2  testing.  Cervical Cancer Your health care provider may recommend that you be screened regularly for cancer of the pelvic organs (ovaries, uterus, and vagina). This screening involves a pelvic examination, including checking for microscopic changes to the surface of your cervix (Pap test). You may be encouraged to have this screening done every 3 years, beginning at age 21.  For women ages 30-65, health care providers may recommend pelvic exams and Pap testing every 3 years, or they may recommend the Pap and pelvic exam, combined with testing for human papilloma virus (HPV), every 5 years. Some types of HPV increase your risk of cervical cancer. Testing for HPV may also be done on women of any age with unclear Pap test results.  Other health care providers may not recommend any screening for nonpregnant women who are considered low risk for pelvic cancer and who do not have symptoms. Ask your health care provider if a screening pelvic exam is right for you.  If you have had past treatment for cervical cancer or a condition that could lead to cancer, you need Pap tests and screening for cancer for at least 20 years after your treatment. If Pap tests have been discontinued, your risk factors (such as having a new sexual partner) need to be reassessed to determine if screening should resume. Some women have medical problems that increase the chance of getting cervical cancer. In these cases, your health care provider may recommend more frequent screening and Pap tests.  Colorectal Cancer  This type of cancer can be detected and often prevented.  Routine colorectal cancer screening usually begins at 67 years of age and continues through 67 years of age.  Your health care provider may recommend screening at an earlier age if you have risk factors for colon cancer.  Your health care provider may also recommend using home test kits to check for hidden blood in the stool.  A small camera at the end of a  tube can be used to examine your colon directly (sigmoidoscopy or colonoscopy). This is done to check for the earliest forms of colorectal cancer.  Routine screening usually begins at age 50.  Direct examination of the colon should be repeated every 5-10 years through 67 years of age. However, you may need to be screened more often if early forms of precancerous polyps or small growths are found.  Skin Cancer  Check your skin from head to toe regularly.  Tell your health care provider about any new moles or changes in moles, especially if there is a change in a mole's shape or color.  Also tell your health care provider if you have a mole that is larger than the size of a pencil eraser.  Always use sunscreen. Apply sunscreen liberally and repeatedly throughout the day.  Protect yourself by wearing long sleeves, pants, a wide-brimmed hat, and sunglasses whenever you are outside.  Heart disease, diabetes, and high blood pressure  High blood   pressure causes heart disease and increases the risk of stroke. High blood pressure is more likely to develop in: ? People who have blood pressure in the high end of the normal range (130-139/85-89 mm Hg). ? People who are overweight or obese. ? People who are African American.  If you are 18-39 years of age, have your blood pressure checked every 3-5 years. If you are 40 years of age or older, have your blood pressure checked every year. You should have your blood pressure measured twice-once when you are at a hospital or clinic, and once when you are not at a hospital or clinic. Record the average of the two measurements. To check your blood pressure when you are not at a hospital or clinic, you can use: ? An automated blood pressure machine at a pharmacy. ? A home blood pressure monitor.  If you are between 55 years and 79 years old, ask your health care provider if you should take aspirin to prevent strokes.  Have regular diabetes screenings. This  involves taking a blood sample to check your fasting blood sugar level. ? If you are at a normal weight and have a low risk for diabetes, have this test once every three years after 67 years of age. ? If you are overweight and have a high risk for diabetes, consider being tested at a younger age or more often. Preventing infection Hepatitis B  If you have a higher risk for hepatitis B, you should be screened for this virus. You are considered at high risk for hepatitis B if: ? You were born in a country where hepatitis B is common. Ask your health care provider which countries are considered high risk. ? Your parents were born in a high-risk country, and you have not been immunized against hepatitis B (hepatitis B vaccine). ? You have HIV or AIDS. ? You use needles to inject street drugs. ? You live with someone who has hepatitis B. ? You have had sex with someone who has hepatitis B. ? You get hemodialysis treatment. ? You take certain medicines for conditions, including cancer, organ transplantation, and autoimmune conditions.  Hepatitis C  Blood testing is recommended for: ? Everyone born from 1945 through 1965. ? Anyone with known risk factors for hepatitis C.  Sexually transmitted infections (STIs)  You should be screened for sexually transmitted infections (STIs) including gonorrhea and chlamydia if: ? You are sexually active and are younger than 67 years of age. ? You are older than 67 years of age and your health care provider tells you that you are at risk for this type of infection. ? Your sexual activity has changed since you were last screened and you are at an increased risk for chlamydia or gonorrhea. Ask your health care provider if you are at risk.  If you do not have HIV, but are at risk, it may be recommended that you take a prescription medicine daily to prevent HIV infection. This is called pre-exposure prophylaxis (PrEP). You are considered at risk if: ? You are  sexually active and do not regularly use condoms or know the HIV status of your partner(s). ? You take drugs by injection. ? You are sexually active with a partner who has HIV.  Talk with your health care provider about whether you are at high risk of being infected with HIV. If you choose to begin PrEP, you should first be tested for HIV. You should then be tested every 3 months for as   long as you are taking PrEP. Pregnancy  If you are premenopausal and you may become pregnant, ask your health care provider about preconception counseling.  If you may become pregnant, take 400 to 800 micrograms (mcg) of folic acid every day.  If you want to prevent pregnancy, talk to your health care provider about birth control (contraception). Osteoporosis and menopause  Osteoporosis is a disease in which the bones lose minerals and strength with aging. This can result in serious bone fractures. Your risk for osteoporosis can be identified using a bone density scan.  If you are 48 years of age or older, or if you are at risk for osteoporosis and fractures, ask your health care provider if you should be screened.  Ask your health care provider whether you should take a calcium or vitamin D supplement to lower your risk for osteoporosis.  Menopause may have certain physical symptoms and risks.  Hormone replacement therapy may reduce some of these symptoms and risks. Talk to your health care provider about whether hormone replacement therapy is right for you. Follow these instructions at home:  Schedule regular health, dental, and eye exams.  Stay current with your immunizations.  Do not use any tobacco products including cigarettes, chewing tobacco, or electronic cigarettes.  If you are pregnant, do not drink alcohol.  If you are breastfeeding, limit how much and how often you drink alcohol.  Limit alcohol intake to no more than 1 drink per day for nonpregnant women. One drink equals 12 ounces of  beer, 5 ounces of wine, or 1 ounces of hard liquor.  Do not use street drugs.  Do not share needles.  Ask your health care provider for help if you need support or information about quitting drugs.  Tell your health care provider if you often feel depressed.  Tell your health care provider if you have ever been abused or do not feel safe at home. This information is not intended to replace advice given to you by your health care provider. Make sure you discuss any questions you have with your health care provider. Document Released: 04/23/2011 Document Revised: 03/15/2016 Document Reviewed: 07/12/2015 Elsevier Interactive Patient Education  [employer].

## 2017-09-16 NOTE — Assessment & Plan Note (Signed)
Checking labs, counseled about shingrix. Given pneumonia 23 given at visit. Flu shot and tetanus up to date. Colonoscopy and mammogram up to date. Counseled about sun safety and mole surveillance. Given 10 year screening recommendations.

## 2017-09-16 NOTE — Progress Notes (Signed)
   Subjective:    Patient ID: Jessica [name], female    DOB: 1951-10-21, 66 y.o.   MRN: [id number]  HPI The patient is a 66 YO female coming in for physical.  PMH,FMH, social history reviewed and updated.  Review of Systems  Constitutional: Negative.   HENT: Negative.   Eyes: Negative.   Respiratory: Negative for cough, chest tightness and shortness of breath.   Cardiovascular: Negative for chest pain, palpitations and leg swelling.  Gastrointestinal: Negative for abdominal distention, abdominal pain, constipation, diarrhea, nausea and vomiting.  Musculoskeletal: Negative.   Skin: Negative.   Neurological: Negative.   Psychiatric/Behavioral: Negative.      Objective:   Physical Exam  Constitutional: She is oriented to person, place, and time. She appears well-developed and well-nourished.  HENT:  Head: Normocephalic and atraumatic.  Eyes: EOM are normal.  Neck: Normal range of motion.  Cardiovascular: Normal rate and regular rhythm.  Pulmonary/Chest: Effort normal and breath sounds normal. No respiratory distress. She has no wheezes. She has no rales.  Abdominal: Soft. Bowel sounds are normal. She exhibits no distension. There is no tenderness. There is no rebound.  Musculoskeletal: She exhibits no edema.  Neurological: She is alert and oriented to person, place, and time. Coordination normal.  Skin: Skin is warm and dry.  Psychiatric: She has a normal mood and affect.   Vitals:   09/16/17 1031  BP: 130/80  Pulse: (!) 55  Temp: 98.5 F (36.9 C)  TempSrc: Oral  SpO2: 99%  Weight: 189 lb 8 oz (86 kg)  Height: 5' 5.5" (1.664 m)      Assessment & Plan:  Pneumonia 23 given at visit

## 2017-10-02 NOTE — Progress Notes (Signed)
Subjective:   Jessica [name] is a 66 y.o. female who presents for Medicare Annual (Subsequent) preventive examination.  Review of Systems:  No ROS.  Medicare Wellness Visit. Additional risk factors are reflected in the social history.  Cardiac Risk Factors include: advanced age (>65men, >18 women);dyslipidemia Sleep patterns: has difficulty falling asleep, sleeps through the night, gets up 2 times nightly to void and sleeps 4-5 hours nightly. Patient reports insomnia issues, discussed recommended sleep tips and stress reduction tips, education was provided via [facility] Safety/Smoke Alarms: Feels safe in home. Smoke alarms in place.  Living environment; residence and Firearm Safety: 2-story house, no firearms. Lives with husband no needs for DME, good support system Seat Belt Safety/Bike Helmet: Wears seat belt.    Objective:     Vitals: BP 126/72   Pulse 72   Resp 20   Ht 5\' 6"  (1.676 m)   Wt 185 lb (83.9 kg)   SpO2 100%   BMI 29.86 kg/m   Body mass index is 29.86 kg/m.  Advanced Directives 10/03/2017  Does Patient Have a Medical Advance Directive? No  Would patient like information on creating a medical advance directive? Yes (ED - Information included in AVS)    Tobacco Social History   Tobacco Use  Smoking Status Never Smoker  Smokeless Tobacco Never Used     Counseling given: Not Answered   Past Medical History:  Diagnosis Date  . Allergy   . Breast cancer ([place]) 2000  . Cancer [facility])    breast  . Colon polyp   . GERD (gastroesophageal reflux disease)   . Hyperlipidemia   . Migraine   . Personal history of radiation therapy 2000   Past Surgical History:  Procedure Laterality Date  . ABDOMINAL HYSTERECTOMY    . BREAST LUMPECTOMY Right 2000  . BREAST SURGERY    . EYE SURGERY    . hemorrhoid     Family History  Problem Relation Age of Onset  . Heart disease Brother   . Asthma Paternal Aunt   . Diabetes Paternal Aunt   . Stroke Paternal Uncle     . Kidney disease Paternal Uncle    Social History   Socioeconomic History  . Marital status: Married    Spouse name: None  . Number of children: None  . Years of education: None  . Highest education level: Master's degree (e.g., MA, MS, MEng, MEd, MSW, MBA)  Social Needs  . Financial resource strain: Not hard at all  . Food insecurity - worry: Never true  . Food insecurity - inability: Never true  . Transportation needs - medical: No  . Transportation needs - non-medical: No  Occupational History  . None  Tobacco Use  . Smoking status: Never Smoker  . Smokeless tobacco: Never Used  Substance and Sexual Activity  . Alcohol use: No    Frequency: Never  . Drug use: No  . Sexual activity: Yes  Other Topics Concern  . None  Social History Narrative  . None    Outpatient Encounter Medications as of 10/03/2017  Medication Sig  . Digestive Enzymes (DIGESTIVE ENZYME PO) Take by mouth.  . loratadine (CLARITIN) 10 MG tablet Take 10 mg by mouth daily.   . Multiple Vitamin (MULTI VITAMIN DAILY PO) Take by mouth.  . Vitamin D, Cholecalciferol, 1000 units CAPS Take by mouth.   No facility-administered encounter medications on file as of 10/03/2017.     Activities of Daily Living In your  present state of health, do you have any difficulty performing the following activities: 10/03/2017  Hearing? N  Vision? N  Difficulty concentrating or making decisions? N  Walking or climbing stairs? N  Dressing or bathing? N  Doing errands, shopping? N  Preparing Food and eating ? N  Using the Toilet? N  In the past six months, have you accidently leaked urine? N  Do you have problems with loss of bowel control? N  Managing your Medications? N  Managing your Finances? N  Housekeeping or managing your Housekeeping? N  Some recent data might be hidden    Patient Care Team: Hoyt Koch, MD as PCP - General (Internal Medicine)    Assessment:    Physical assessment deferred  to PCP.  Exercise Activities and Dietary recommendations Current Exercise Habits: Home exercise routine, Type of exercise: walking, Time (Minutes): 30, Frequency (Times/Week): 4, Weekly Exercise (Minutes/Week): 120, Exercise limited by: None identified  Diet (meal preparation, eat out, water intake, caffeinated beverages, dairy products, fruits and vegetables): in general, a "healthy" diet  , well balanced   Reviewed heart healthy diet, encouraged patient to increase daily water intake. Diet education was attached to patient's AVS.  Goals    . Patient Stated     Change my life style by monitoring my diet closely for cholesterol, salt, and sugar. Increase my exercise to help decrease my cholesterol level. Continue to worship God and live to do God's work.       Fall Risk Fall Risk  10/03/2017 09/16/2017 07/27/2016  Falls in the past year? No No No   Depression Screen PHQ 2/9 Scores 10/03/2017 09/16/2017 07/27/2016  PHQ - 2 Score 0 0 0  PHQ- 9 Score 4 - -     Cognitive Function       Ad8 score reviewed for issues:  Issues making decisions: no  Less interest in hobbies / activities: no  Repeats questions, stories (family complaining): no  Trouble using ordinary gadgets (microwave, computer, phone):no  Forgets the month or year: no  Mismanaging finances: no  Remembering appts: no  Daily problems with thinking and/or memory: no Ad8 score is= 0  Immunization History  Administered Date(s) Administered  . Influenza, High Dose Seasonal PF 07/27/2016, 06/27/2017  . Influenza, Seasonal, Injecte, Preservative Fre 08/09/2014  . Influenza,inj,Quad PF,6+ Mos 08/17/2015  . Influenza-Unspecified 07/27/2016  . Pneumococcal Conjugate-13 07/27/2016  . Pneumococcal Polysaccharide-23 09/16/2017  . Tdap 02/03/2005, 10/22/2006, 10/02/2016   Screening Tests Health Maintenance  Topic Date Due  . MAMMOGRAM  06/29/2019  . COLONOSCOPY  02/04/2024  . TETANUS/TDAP  10/02/2026  .  INFLUENZA VACCINE  Completed  . DEXA SCAN  Completed  . Hepatitis C Screening  Completed  . PNA vac Low Risk Adult  Completed      Plan:    Continue doing brain stimulating activities (puzzles, reading, adult coloring books, staying active) to keep memory sharp.   Continue to eat heart healthy diet (full of fruits, vegetables, whole grains, lean protein, water--limit salt, fat, and sugar intake) and increase physical activity as tolerated.   I have personally reviewed and noted the following in the patient's chart:   . Medical and social history . Use of alcohol, tobacco or illicit drugs  . Current medications and supplements . Functional ability and status . Nutritional status . Physical activity . Advanced directives . List of other physicians . Vitals . Screenings to include cognitive, depression, and falls . Referrals and appointments  In addition, I  have reviewed and discussed with patient certain preventive protocols, quality metrics, and best practice recommendations. A written personalized care plan for preventive services as well as general preventive health recommendations were provided to patient.     Michiel Cowboy, RN  10/03/2017

## 2017-10-03 ENCOUNTER — Ambulatory Visit (INDEPENDENT_AMBULATORY_CARE_PROVIDER_SITE_OTHER): Payer: Medicare Other | Admitting: *Deleted

## 2017-10-03 VITALS — BP 126/72 | HR 72 | Resp 20 | Ht 66.0 in | Wt 185.0 lb

## 2017-10-03 DIAGNOSIS — Z0000 Encounter for general adult medical examination without abnormal findings: Secondary | ICD-10-CM

## 2017-10-03 NOTE — Patient Instructions (Addendum)
Continue doing brain stimulating activities (puzzles, reading, adult coloring books, staying active) to keep memory sharp.   Continue to eat heart healthy diet (full of fruits, vegetables, whole grains, lean protein, water--limit salt, fat, and sugar intake) and increase physical activity as tolerated.   Jessica [name] , Thank you for taking time to come for your Medicare Wellness Visit. I appreciate your ongoing commitment to your health goals. Please review the following plan we discussed and let me know if I can assist you in the future.   These are the goals we discussed: Goals    . Patient Stated     Change my life style by monitoring my diet closely for cholesterol, salt, and sugar. Increase my exercise to help decrease my cholesterol level. Continue to worship God and live to do God's work.        This is a list of the screening recommended for you and due dates:  Health Maintenance  Topic Date Due  . Mammogram  06/29/2019  . Colon Cancer Screening  02/04/2024  . Tetanus Vaccine  10/02/2026  . Flu Shot  Completed  . DEXA scan (bone density measurement)  Completed  .  Hepatitis C: One time screening is recommended by Center for Disease Control  (CDC) for  adults born from [birth year] through 1966.   Completed  . Pneumonia vaccines  Completed      Protein Content in Foods Generally, most healthy people need around 50 grams of protein each day. Depending on your overall health, you may need more or less protein in your diet. Talk to your health care provider or dietitian about how much protein you need. See the following list for the protein content of some common foods. High-protein foods High-protein foods contain 4 grams (4 g) or more of protein per serving. They include:  Beef, ground sirloin (cooked) - 3 oz have 24 g of protein.  Cheese (hard) - 1 oz has 7 g of protein.  Chicken breast, boneless and skinless (cooked) - 3 oz have 13.4 g of protein.  Cottage cheese - 1/2 cup has  13.4 g of protein.  Egg - 1 egg has 6 g of protein.  Fish, filet (cooked) - 1 oz has 6-7 g of protein.  Garbanzo beans (canned or cooked) - 1/2 cup has 6-7 g of protein.  Kidney beans (canned or cooked) - 1/2 cup has 6-7 g of protein.  Lamb (cooked) - 3 oz has 24 g of protein.  Milk - 1 cup (8 oz) has 8 g of protein.  Nuts (peanuts, pistachios, almonds) - 1 oz has 6 g of protein.  Peanut butter - 1 oz has 7-8 g of protein.  Pork tenderloin (cooked) - 3 oz has 18.4 g of protein.  Pumpkin seeds - 1 oz has 8.5 g of protein.  Soybeans (roasted) - 1 oz has 8 g of protein.  Soybeans (cooked) - 1/2 cup has 11 g of protein.  Soy milk - 1 cup (8 oz) has 5-10 g of protein.  Soy or vegetable patty - 1 patty has 11 g of protein.  Sunflower seeds - 1 oz has 5.5 g of protein.  Tofu (firm) - 1/2 cup has 20 g of protein.  Tuna (canned in water) - 3 oz has 20 g of protein.  Yogurt - 6 oz has 8 g of protein.  Low-protein foods Low-protein foods contain 3 grams (3 g) or less of protein per serving. They include:  Beets (raw or  cooked) - 1/2 cup has 1.5 g of protein.  Bran cereal - 1/2 cup has 2-3 g of protein.  Bread - 1 slice has 2.5 g of protein.  Broccoli (raw or cooked) - 1/2 cup has 2 g of protein.  Collard greens (raw or cooked) - 1/2 cup has 2 g of protein.  Corn (fresh or cooked) - 1/2 cup has 2 g of protein.  Cream cheese - 1 oz has 2 g of protein.  Creamer (half-and-half) - 1 oz has 1 g of protein.  Flour tortilla - 1 tortilla has 2.5 g of protein  Frozen yogurt - 1/2 cup has 3 g of protein.  Fruit or vegetable juice - 1/2 cup has 1 g of protein.  Green beans (raw or cooked) - 1/2 cup has 1 g of protein.  Green peas (canned) - 1/2 cup has 3.5 g of protein.  Muffins - 1 small muffin (2 oz) has 3 g of protein.  Oatmeal (cooked) - 1/2 cup has 3 g of protein.  Potato (baked with skin) - 1 medium potato has 3 g of protein.  Rice (cooked) - 1/2 cup has  2.5-3.5 g of protein.  Sour cream - 1/2 cup has 2.5 g of protein.  Spinach (cooked) - 1/2 cup has 3 g of protein.  Squash (cooked) - 1/2 cup has 1.5 g of protein.  Actual amounts of protein may be different depending on processing. Talk with your health care provider or dietitian about what foods are recommended for you. This information is not intended to replace advice given to you by your health care provider. Make sure you discuss any questions you have with your health care provider. Document Released: 01/07/2016 Document Revised: 06/18/2016 Document Reviewed: 06/18/2016 Elsevier Interactive Patient Education  [employer].

## 2017-10-10 NOTE — Progress Notes (Signed)
Patient ID: Jessica [name], female   DOB: 06-26-1951, 66 y.o.   MRN: [id number] Medical screening examination/treatment/procedure(s) were performed by non-physician practitioner and as supervising physician I was immediately available for consultation/collaboration. I agree with above. Hoyt Koch, MD

## 2017-12-18 ENCOUNTER — Ambulatory Visit (INDEPENDENT_AMBULATORY_CARE_PROVIDER_SITE_OTHER): Payer: Medicare Other

## 2017-12-18 ENCOUNTER — Encounter: Payer: Self-pay | Admitting: Podiatry

## 2017-12-18 ENCOUNTER — Ambulatory Visit: Payer: Medicare Other | Admitting: Podiatry

## 2017-12-18 DIAGNOSIS — L84 Corns and callosities: Secondary | ICD-10-CM | POA: Diagnosis not present

## 2017-12-18 DIAGNOSIS — M2041 Other hammer toe(s) (acquired), right foot: Secondary | ICD-10-CM | POA: Diagnosis not present

## 2017-12-18 DIAGNOSIS — M79673 Pain in unspecified foot: Secondary | ICD-10-CM

## 2017-12-18 DIAGNOSIS — Q828 Other specified congenital malformations of skin: Secondary | ICD-10-CM

## 2017-12-18 DIAGNOSIS — M779 Enthesopathy, unspecified: Secondary | ICD-10-CM | POA: Diagnosis not present

## 2017-12-19 NOTE — Progress Notes (Signed)
  Subjective:  Patient ID: Jessica [name], female    DOB: 16-May-1951,  MRN: [id number]  Chief Complaint  Patient presents with  . Callouses    i have a corn on my 5th toe right foot    67 y.o. female presents with the above complaint.  Has underwent surgery twice for the past 25 years to the right fifth toe.  First was done in [place] believed to be Dr. Petra Kuba.  States that she also had surgery with Dr. Cheryll Dessert years ago.  Reports soreness from the callus.  Has tried to shave it down herself. When shaved down does not her for months.  States that it does not hurt when she wears sandals in the summer.   Past Medical History:  Diagnosis Date  . Allergy   . Breast cancer ([place]) 2000  . Cancer [facility])    breast  . Colon polyp   . GERD (gastroesophageal reflux disease)   . Hyperlipidemia   . Migraine   . Personal history of radiation therapy 2000   Past Surgical History:  Procedure Laterality Date  . ABDOMINAL HYSTERECTOMY    . BREAST LUMPECTOMY Right 2000  . BREAST SURGERY    . EYE SURGERY    . hemorrhoid      Current Outpatient Medications:  .  Digestive Enzymes (DIGESTIVE ENZYME PO), Take by mouth., Disp: , Rfl:  .  loratadine (CLARITIN) 10 MG tablet, Take 10 mg by mouth daily. , Disp: , Rfl:  .  Multiple Vitamin (MULTI VITAMIN DAILY PO), Take by mouth., Disp: , Rfl:  .  Vitamin D, Cholecalciferol, 1000 units CAPS, Take by mouth., Disp: , Rfl:   Allergies  Allergen Reactions  . Codeine   . Sulfa Antibiotics   . Sulfacetamide     Other reaction(s): Other (See Comments) swelling   Review of Systems all systems reviewed negative except as noted in HPI Objective:  There were no vitals filed for this visit. General AA&O x3. Normal mood and affect.  Vascular Dorsalis pedis and posterior tibial pulses  present 2+ bilaterally  Capillary refill normal to all digits. Pedal hair growth normal.  Neurologic Epicritic sensation grossly present.  Dermatologic No open  lesions. Interspaces clear of maceration. Nails well groomed and normal in appearance. R 5th PIPJ soft corn.  Orthopedic: MMT 5/5 in dorsiflexion, plantarflexion, inversion, and eversion. Normal joint ROM without pain or crepitus. R 5th toe adductovarus deformity.   Assessment & Plan:  Patient was evaluated and treated and all questions answered.  Right fifth toe adductovarus toe with callus formation PIPJ -X-rays taken reviewed.  Evidence of prior resection of the partial proximal phalangeal head.  No acute fractures dislocations -Lesion pared. -Educated on self-care -Discussed possible surgical correction.  At present patient states that she pain is not severe enough to warrant surgical correction as after this event it does not hurt her for months.  Should pain worsen would consider surgical therapy    No Follow-up on file.

## 2018-01-15 ENCOUNTER — Ambulatory Visit: Payer: Medicare Other | Admitting: Podiatry

## 2018-01-26 NOTE — Progress Notes (Deleted)
[name] [address] [place], [place] [address] Phone: [phone] Subjective:    I'm seeing this patient by the request  of:    CC: Right leg pain  TIW:[id number]  Jessica [name] is a 67 y.o. female coming in with complaint of right leg pain  Onset-  Location Duration-  Character- Aggravating factors- Reliving factors-  Therapies tried-  Severity-     Past Medical History:  Diagnosis Date  . Allergy   . Breast cancer ([place]) 2000  . Cancer [facility])    breast  . Colon polyp   . GERD (gastroesophageal reflux disease)   . Hyperlipidemia   . Migraine   . Personal history of radiation therapy 2000   Past Surgical History:  Procedure Laterality Date  . ABDOMINAL HYSTERECTOMY    . BREAST LUMPECTOMY Right 2000  . BREAST SURGERY    . EYE SURGERY    . hemorrhoid     Social History   Socioeconomic History  . Marital status: Married    Spouse name: Not on file  . Number of children: Not on file  . Years of education: Not on file  . Highest education level: Master's degree (e.g., MA, MS, MEng, MEd, MSW, MBA)  Occupational History  . Not on file  Social Needs  . Financial resource strain: Not hard at all  . Food insecurity:    Worry: Never true    Inability: Never true  . Transportation needs:    Medical: No    Non-medical: No  Tobacco Use  . Smoking status: Never Smoker  . Smokeless tobacco: Never Used  Substance and Sexual Activity  . Alcohol use: No    Frequency: Never  . Drug use: No  . Sexual activity: Yes  Lifestyle  . Physical activity:    Days per week: 0 days    Minutes per session: 0 min  . Stress: To some extent  Relationships  . Social connections:    Talks on phone: More than three times a week    Gets together: More than three times a week    Attends religious service: More than 4 times per year    Active member of club or organization: Not on file    Attends meetings of clubs or organizations: More than 4  times per year    Relationship status: Married  Other Topics Concern  . Not on file  Social History Narrative  . Not on file   Allergies  Allergen Reactions  . Codeine   . Sulfa Antibiotics   . Sulfacetamide     Other reaction(s): Other (See Comments) swelling   Family History  Problem Relation Age of Onset  . Heart disease Brother   . Asthma Paternal Aunt   . Diabetes Paternal Aunt   . Stroke Paternal Uncle   . Kidney disease Paternal Uncle      Past medical history, social, surgical and family history all reviewed in electronic medical record.  No pertanent information unless stated regarding to the chief complaint.   Review of Systems:Review of systems updated and as accurate as of 01/26/18  No headache, visual changes, nausea, vomiting, diarrhea, constipation, dizziness, abdominal pain, skin rash, fevers, chills, night sweats, weight loss, swollen lymph nodes, body aches, joint swelling, muscle aches, chest pain, shortness of breath, mood changes.   Objective  There were no vitals taken for this visit. Systems examined below as of 01/26/18   General: No apparent distress alert  and oriented x3 mood and affect normal, dressed appropriately.  HEENT: Pupils equal, extraocular movements intact  Respiratory: Patient's speak in full sentences and does not appear short of breath  Cardiovascular: No lower extremity edema, non tender, no erythema  Skin: Warm dry intact with no signs of infection or rash on extremities or on axial skeleton.  Abdomen: Soft nontender  Neuro: Cranial nerves II through XII are intact, neurovascularly intact in all extremities with 2+ DTRs and 2+ pulses.  Lymph: No lymphadenopathy of posterior or anterior cervical chain or axillae bilaterally.  Gait normal with good balance and coordination.  MSK:  Non tender with full range of motion and good stability and symmetric strength and tone of shoulders, elbows, wrist, hip, knee and ankles bilaterally.       Impression and Recommendations:     This case required medical decision making of moderate complexity.      Note: This dictation was prepared with Dragon dictation along with smaller phrase technology. Any transcriptional errors that result from this process are unintentional.

## 2018-01-27 ENCOUNTER — Ambulatory Visit: Payer: Medicare Other | Admitting: Family Medicine

## 2018-01-27 DIAGNOSIS — Z0289 Encounter for other administrative examinations: Secondary | ICD-10-CM

## 2018-04-23 ENCOUNTER — Other Ambulatory Visit: Payer: Self-pay | Admitting: Internal Medicine

## 2018-04-23 DIAGNOSIS — Z1231 Encounter for screening mammogram for malignant neoplasm of breast: Secondary | ICD-10-CM

## 2018-05-16 ENCOUNTER — Encounter: Payer: Self-pay | Admitting: Internal Medicine

## 2018-05-16 ENCOUNTER — Other Ambulatory Visit (INDEPENDENT_AMBULATORY_CARE_PROVIDER_SITE_OTHER): Payer: Medicare Other

## 2018-05-16 ENCOUNTER — Ambulatory Visit: Payer: Medicare Other | Admitting: Internal Medicine

## 2018-05-16 VITALS — BP 140/90 | HR 56 | Temp 98.3°F | Ht 66.0 in | Wt 188.0 lb

## 2018-05-16 DIAGNOSIS — R202 Paresthesia of skin: Secondary | ICD-10-CM

## 2018-05-16 DIAGNOSIS — N644 Mastodynia: Secondary | ICD-10-CM | POA: Diagnosis not present

## 2018-05-16 DIAGNOSIS — R200 Anesthesia of skin: Secondary | ICD-10-CM

## 2018-05-16 LAB — VITAMIN D 25 HYDROXY (VIT D DEFICIENCY, FRACTURES): VITD: 35.87 ng/mL (ref 30.00–100.00)

## 2018-05-16 LAB — TSH: TSH: 1.32 u[IU]/mL (ref 0.35–4.50)

## 2018-05-16 LAB — HEMOGLOBIN A1C: Hgb A1c MFr Bld: 6.1 % (ref 4.6–6.5)

## 2018-05-16 LAB — VITAMIN B12: VITAMIN B 12: 272 pg/mL (ref 211–911)

## 2018-05-16 NOTE — Progress Notes (Signed)
   Subjective:    Patient ID: Jessica [name], female    DOB: 08-17-1951, 67 y.o.   MRN: [id number]  HPI The patient is a 67 YO female coming in for right breast pain. She got some new bras about 1 week or so ago. She was not sure if this was related. She is having the pain on the top of the right breast and under the right breast. This is the breast she has hx breast cancer in (2000). She denies any new lumps. Denies nipple discharge. Some dark change in the area of pain.   Review of Systems  Constitutional: Negative.   Respiratory: Negative for cough, chest tightness and shortness of breath.   Cardiovascular: Negative for chest pain, palpitations and leg swelling.  Gastrointestinal: Negative for abdominal distention, abdominal pain, constipation, diarrhea, nausea and vomiting.  Genitourinary:       Right breast pain  Musculoskeletal: Negative.   Skin: Negative.   Neurological: Negative.       Objective:   Physical Exam  Constitutional: She is oriented to person, place, and time. She appears well-developed and well-nourished.  HENT:  Head: Normocephalic and atraumatic.  Eyes: EOM are normal.  Neck: Normal range of motion.  Cardiovascular: Normal rate and regular rhythm.  Pulmonary/Chest: Effort normal and breath sounds normal. No respiratory distress. She has no wheezes. She has no rales.  Breast exam with some dark area on the right breast superior 10 o'clock without underlying lesion or mass, no nipple discharge, no LAD axillary  Abdominal: Soft. She exhibits no distension. There is no tenderness. There is no rebound.  Musculoskeletal: She exhibits no edema.  Neurological: She is alert and oriented to person, place, and time. Coordination normal.  Skin: Skin is warm and dry.   Vitals:   05/16/18 1331  BP: 140/90  Pulse: (!) 56  Temp: 98.3 F (36.8 C)  TempSrc: Oral  SpO2: 98%  Weight: 188 lb (85.3 kg)  Height: 5\' 6"  (1.676 m)      Assessment & Plan:

## 2018-05-16 NOTE — Patient Instructions (Signed)
We will see if this goes away in 1-2 weeks and if not let us know and we will get you in for the mammogram early.

## 2018-05-16 NOTE — Assessment & Plan Note (Signed)
Suspect pain from bra change. Asked her to change bra and see if still having pain. Breast exam normal. If no improvement will order diagnostic mammogram given history of cancer.

## 2018-06-07 ENCOUNTER — Encounter: Payer: Self-pay | Admitting: Family Medicine

## 2018-06-09 NOTE — Telephone Encounter (Signed)
Patient came by the office stating that she no longer needs this appointment. She went to Urgent Care.

## 2018-06-12 ENCOUNTER — Ambulatory Visit: Payer: Medicare Other | Admitting: Internal Medicine

## 2018-06-12 ENCOUNTER — Encounter: Payer: Self-pay | Admitting: Internal Medicine

## 2018-06-12 VITALS — BP 110/70 | HR 61 | Temp 97.8°F | Ht 66.0 in | Wt 190.0 lb

## 2018-06-12 DIAGNOSIS — N644 Mastodynia: Secondary | ICD-10-CM

## 2018-06-12 NOTE — Patient Instructions (Signed)
We will move up the mammogram to check out the right breast.

## 2018-06-12 NOTE — Assessment & Plan Note (Signed)
Ordered diagnostic mammogram and ultrasound for pain. Right lumpectomy 2000 for breast cancer in right breast. Concern for changes.

## 2018-06-12 NOTE — Progress Notes (Signed)
   Subjective:    Patient ID: Jessica [name], female    DOB: 1951/03/22, 67 y.o.   MRN: [id number]  HPI The patient is a 67 YO female coming in for recurrent right breast pain. She had an episode of this about 2-3 weeks ago and this resolved and was thought to be related to a new bra. She is now having pain in the same location and felt a possible lump. Hx breast cancer in 2000 in the right breast with lumpectomy. Denies nipple discharge or skin change.   Review of Systems  Constitutional: Negative.   Respiratory: Negative for cough, chest tightness and shortness of breath.   Cardiovascular: Negative for chest pain, palpitations and leg swelling.  Gastrointestinal: Negative for abdominal distention, abdominal pain, constipation, diarrhea, nausea and vomiting.  Genitourinary:       Right breast pain  Musculoskeletal: Negative.   Skin: Negative.   Neurological: Negative.   Psychiatric/Behavioral: Negative.       Objective:   Physical Exam  Constitutional: She is oriented to person, place, and time. She appears well-developed and well-nourished.  HENT:  Head: Normocephalic and atraumatic.  Eyes: EOM are normal.  Neck: Normal range of motion.  Cardiovascular: Normal rate and regular rhythm.  Right breast tender at the 11 o'clock superior without mass appreciated, no LAD  Pulmonary/Chest: Effort normal and breath sounds normal. No respiratory distress. She has no wheezes. She has no rales.  Musculoskeletal: She exhibits no edema.  Neurological: She is alert and oriented to person, place, and time. Coordination normal.  Skin: Skin is warm and dry.   Vitals:   06/12/18 0808  BP: 110/70  Pulse: 61  Temp: 97.8 F (36.6 C)  TempSrc: Oral  SpO2: 98%  Weight: 190 lb (86.2 kg)  Height: 5\' 6"  (1.676 m)      Assessment & Plan:

## 2018-06-30 ENCOUNTER — Ambulatory Visit
Admission: RE | Admit: 2018-06-30 | Discharge: 2018-06-30 | Disposition: A | Discharge status: Home / Self Care | Payer: Medicare Other | Source: Ambulatory Visit | Attending: Internal Medicine | Admitting: Internal Medicine

## 2018-06-30 ENCOUNTER — Ambulatory Visit: Payer: Medicare Other

## 2018-06-30 DIAGNOSIS — N644 Mastodynia: Secondary | ICD-10-CM

## 2018-09-17 ENCOUNTER — Encounter: Payer: Self-pay | Admitting: Internal Medicine

## 2018-09-17 ENCOUNTER — Ambulatory Visit (INDEPENDENT_AMBULATORY_CARE_PROVIDER_SITE_OTHER): Payer: Medicare Other | Admitting: Internal Medicine

## 2018-09-17 ENCOUNTER — Other Ambulatory Visit (INDEPENDENT_AMBULATORY_CARE_PROVIDER_SITE_OTHER): Payer: Medicare Other

## 2018-09-17 VITALS — BP 128/88 | HR 63 | Temp 98.0°F | Ht 66.0 in | Wt 188.0 lb

## 2018-09-17 DIAGNOSIS — K219 Gastro-esophageal reflux disease without esophagitis: Secondary | ICD-10-CM

## 2018-09-17 DIAGNOSIS — Z0000 Encounter for general adult medical examination without abnormal findings: Secondary | ICD-10-CM | POA: Diagnosis not present

## 2018-09-17 DIAGNOSIS — Z853 Personal history of malignant neoplasm of breast: Secondary | ICD-10-CM | POA: Diagnosis not present

## 2018-09-17 DIAGNOSIS — Z23 Encounter for immunization: Secondary | ICD-10-CM | POA: Diagnosis not present

## 2018-09-17 LAB — COMPREHENSIVE METABOLIC PANEL
ALBUMIN: 4.2 g/dL (ref 3.5–5.2)
ALT: 33 U/L (ref 0–35)
AST: 33 U/L (ref 0–37)
Alkaline Phosphatase: 57 U/L (ref 39–117)
BILIRUBIN TOTAL: 0.5 mg/dL (ref 0.2–1.2)
BUN: 10 mg/dL (ref 6–23)
CALCIUM: 9.5 mg/dL (ref 8.4–10.5)
CO2: 29 mEq/L (ref 19–32)
CREATININE: 0.93 mg/dL (ref 0.40–1.20)
Chloride: 103 mEq/L (ref 96–112)
GFR: 77.09 mL/min (ref >60.00)
Glucose, Bld: 88 mg/dL (ref 70–99)
Potassium: 3.8 mEq/L (ref 3.5–5.1)
SODIUM: 139 meq/L (ref 135–145)
TOTAL PROTEIN: 7.6 g/dL (ref 6.0–8.3)

## 2018-09-17 LAB — LIPID PANEL
CHOLESTEROL: 236 mg/dL — AB (ref 0–200)
HDL: 40.1 mg/dL (ref >39.00)
LDL Cholesterol: 174 mg/dL — ABNORMAL HIGH (ref 0–99)
NonHDL: 195.45
TRIGLYCERIDES: 106 mg/dL (ref 0.0–149.0)
Total CHOL/HDL Ratio: 6
VLDL: 21.2 mg/dL (ref 0.0–40.0)

## 2018-09-17 LAB — CBC
HCT: 37.1 % (ref 36.0–46.0)
HEMOGLOBIN: 12.4 g/dL (ref 12.0–15.0)
MCHC: 33.3 g/dL (ref 30.0–36.0)
MCV: 88.1 fl (ref 78.0–100.0)
PLATELETS: 271 10*3/uL (ref 150.0–400.0)
RBC: 4.22 Mil/uL (ref 3.87–5.11)
RDW: 13.5 % (ref 11.5–15.5)
WBC: 5.4 10*3/uL (ref 4.0–10.5)

## 2018-09-17 LAB — HEMOGLOBIN A1C: Hgb A1c MFr Bld: 6 % (ref 4.6–6.5)

## 2018-09-17 LAB — VITAMIN B12: Vitamin B-12: 1145 pg/mL — ABNORMAL HIGH (ref 211–911)

## 2018-09-17 MED ORDER — ZOSTER VAC RECOMB ADJUVANTED 50 MCG/0.5ML IM SUSR: 0.5000 mL | Freq: Once | INTRAMUSCULAR | 1 refills | Status: AC

## 2018-09-17 NOTE — Progress Notes (Signed)
   Subjective:    Patient ID: Jessica [name], female    DOB: January 01, 1951, 67 y.o.   MRN: [id number]  HPI  Here for medicare wellness and physical, no new complaints. Please see A/P for status and treatment of chronic medical problems.   Diet: heart healthy Physical activity: sedentary Depression/mood screen: negative Hearing: intact to whispered voice Visual acuity: grossly normal with lens, performs annual eye exam  ADLs: capable Fall risk: none Home safety: good Cognitive evaluation: intact to orientation, naming, recall and repetition EOL planning: adv directives discussed  I have personally reviewed and have noted 1. The patient's medical and social history - reviewed today no changes 2. Their use of alcohol, tobacco or illicit drugs 3. Their current medications and supplements 4. The patient's functional ability including ADL's, fall risks, home safety risks and hearing or visual impairment. 5. Diet and physical activities 6. Evidence for depression or mood disorders 7. Care team reviewed and updated (available in snapshot)  Review of Systems  Constitutional: Negative.   HENT: Negative.   Eyes: Negative.   Respiratory: Negative for cough, chest tightness and shortness of breath.   Cardiovascular: Negative for chest pain, palpitations and leg swelling.  Gastrointestinal: Negative for abdominal distention, abdominal pain, constipation, diarrhea, nausea and vomiting.  Musculoskeletal: Negative.   Skin: Negative.   Neurological: Negative.   Psychiatric/Behavioral: Negative.       Objective:   Physical Exam  Constitutional: She is oriented to person, place, and time. She appears well-developed and well-nourished.  HENT:  Head: Normocephalic and atraumatic.  Eyes: EOM are normal.  Neck: Normal range of motion.  Cardiovascular: Normal rate and regular rhythm.  Pulmonary/Chest: Effort normal and breath sounds normal. No respiratory distress. She has no wheezes. She has no  rales.  Abdominal: Soft. Bowel sounds are normal. She exhibits no distension. There is no tenderness. There is no rebound.  Musculoskeletal: She exhibits no edema.  Neurological: She is alert and oriented to person, place, and time. Coordination normal.  Skin: Skin is warm and dry.  Psychiatric: She has a normal mood and affect.   Vitals:   09/17/18 0925  BP: 128/88  Pulse: 63  Temp: 98 F (36.7 C)  TempSrc: Oral  SpO2: 97%  Weight: 188 lb (85.3 kg)  Height: 5\' 6"  (1.676 m)      Assessment & Plan:  Flu shot given at visit

## 2018-09-17 NOTE — Assessment & Plan Note (Signed)
Yearly mammogram.  

## 2018-09-17 NOTE — Patient Instructions (Signed)

## 2018-09-17 NOTE — Assessment & Plan Note (Signed)
Flu shot given. Pneumonia complete. Shingrix given rx. Tetanus up to date. Colonoscopy up to date. Mammogram up to date, pap smear aged out and dexa due 2020. Counseled about sun safety and mole surveillance. Counseled about the dangers of distracted driving. Given 10 year screening recommendations.

## 2018-09-17 NOTE — Addendum Note (Signed)
Addended by: Raford Pitcher R on: 09/17/2018 10:09 AM   Modules accepted: Orders

## 2018-09-17 NOTE — Assessment & Plan Note (Signed)
Stable off meds, she has to be careful about foods and when she eats.

## 2018-09-24 ENCOUNTER — Other Ambulatory Visit: Payer: Self-pay | Admitting: Internal Medicine

## 2018-09-24 DIAGNOSIS — E782 Mixed hyperlipidemia: Secondary | ICD-10-CM

## 2018-09-24 MED ORDER — SIMVASTATIN 20 MG PO TABS: 20.0000 mg | ORAL_TABLET | Freq: Every day | ORAL | 3 refills | Status: DC

## 2018-09-29 NOTE — Progress Notes (Signed)
[name] [address] [place], [place] [address] Phone: [phone] Subjective:    I'm seeing this patient by the request  of:    CC: Left leg pain  DEY:[id number]  Jessica [name] is a 67 y.o. female coming in with complaint of left leg pain. No back or hip pain noted. No numbness and tingling.   Onset- chrnoic Location- anterior tibia  Duration-been constant for weeks Character- sting/ burn after shower Aggravating factors- crossing legs, walking   Reliving factors-  Therapies tried-sitting and resting Severity-7 out of 10 but even to light palpation down which is new and why patient came in to be seen     Past Medical History:  Diagnosis Date  . Allergy   . Breast cancer ([place]) 2000  . [facility])    breast  . Colon polyp   . GERD (gastroesophageal reflux disease)   . Hyperlipidemia   . Migraine   . Personal history of radiation therapy 2000   Past Surgical History:  Procedure Laterality Date  . ABDOMINAL HYSTERECTOMY    . BREAST LUMPECTOMY Right 2000  . BREAST SURGERY    . EYE SURGERY    . hemorrhoid     Social History   Socioeconomic History  . Marital status: Married    Spouse name: Not on file  . Number of children: Not on file  . Years of education: Not on file  . Highest education level: Master's degree (e.g., MA, MS, MEng, MEd, MSW, MBA)  Occupational History  . Not on file  Social Needs  . Financial resource strain: Not hard at all  . Food insecurity:    Worry: Never true    Inability: Never true  . Transportation needs:    Medical: No    Non-medical: No  Tobacco Use  . Smoking status: Never Smoker  . Smokeless tobacco: Never Used  Substance and Sexual Activity  . Alcohol use: No    Frequency: Never  . Drug use: No  . Sexual activity: Yes  Lifestyle  . Physical activity:    Days per week: 0 days    Minutes per session: 0 min  . Stress: To some extent  Relationships  . Social connections:    Talks  on phone: More than three times a week    Gets together: More than three times a week    Attends religious service: More than 4 times per year    Active member of club or organization: Not on file    Attends meetings of clubs or organizations: More than 4 times per year    Relationship status: Married  Other Topics Concern  . Not on file  Social History Narrative  . Not on file   Allergies  Allergen Reactions  . Codeine   . Sulfa Antibiotics   . Sulfacetamide     Other reaction(s): Other (See Comments) swelling   Family History  Problem Relation Age of Onset  . Heart disease Brother   . Asthma Paternal Aunt   . Diabetes Paternal Aunt   . Stroke Paternal Uncle   . Kidney disease Paternal Uncle      Current Outpatient Medications (Cardiovascular):  .  simvastatin (ZOCOR) 20 MG tablet, Take 1 tablet (20 mg total) by mouth at bedtime.  Current Outpatient Medications (Respiratory):  .  loratadine (CLARITIN) 10 MG tablet, Take 10 mg by mouth daily.     Current Outpatient Medications (Other):  .  Digestive Enzymes (  DIGESTIVE ENZYME PO), Take by mouth. .  Lifitegrast 5 % SOLN, Place 1 drop into both eyes 2 times daily. .  Multiple Vitamin (MULTI VITAMIN DAILY PO), Take by mouth. .  Vitamin D, Cholecalciferol, 1000 units CAPS, Take by mouth. .  doxycycline (VIBRA-TABS) 100 MG tablet, Take 1 tablet (100 mg total) by mouth 2 (two) times daily for 7 days.    Past medical history, social, surgical and family history all reviewed in electronic medical record.  No pertanent information unless stated regarding to the chief complaint.   Review of Systems:  No headache, visual changes, nausea, vomiting, diarrhea, constipation, dizziness, abdominal pain, skin rash, fevers, chills, night sweats, weight loss, swollen lymph nodes, body aches, joint swelling, , chest pain, shortness of breath, mood changes.  Positive muscle aches  Objective  Blood pressure 130/80, pulse 60, height 5\' 6"   (1.676 m), weight 189 lb (85.7 kg), SpO2 97 %.    General: No apparent distress alert and oriented x3 mood and affect normal, dressed appropriately.  HEENT: Pupils equal, extraocular movements intact  Respiratory: Patient's speak in full sentences and does not appear short of breath  Cardiovascular: No lower extremity edema, non tender, no erythema  Skin: Warm dry intact with no signs of infection or rash on extremities or on axial skeleton.  Abdomen: Soft nontender  Neuro: Cranial nerves II through XII are intact, neurovascularly intact in all extremities with 2+ DTRs and 2+ pulses.  Lymph: No lymphadenopathy of posterior or anterior cervical chain or axillae bilaterally.  Gait mild antalgic MSK:  tender with full range of motion and good stability and symmetric strength and tone of shoulders, elbows, wrist, hip, knee and ankles bilaterally.  Patient's left anterior shin is severely tender to even light palpation.  Patient does have some very mild swelling of the anterior tibialis compared to the contralateral side.  Patient does have some mild pain with resisted dorsiflexion of the foot in this area.  Mild pain over the tibia bone itself.  Neurovascular intact distally.  Negative Thompson test.  [id number]; 15 additional minutes spent for Therapeutic exercises as stated in above notes.  This included exercises focusing on stretching, strengthening, with significant focus on eccentric aspects.   Long term goals include an improvement in range of motion, strength, endurance as well as avoiding reinjury. Patient's frequency would include in 1-2 times a day, 3-5 times a week for a duration of 6-12 weeks.  Proper technique shown and discussed handout in great detail with ATC.  All questions were discussed and answered.     Impression and Recommendations:     This case required medical decision making of moderate complexity. The above documentation has been reviewed and is accurate and complete Jessica Pulley, DO       Note: This dictation was prepared with Dragon dictation along with smaller phrase technology. Any transcriptional errors that result from this process are unintentional.

## 2018-09-30 ENCOUNTER — Ambulatory Visit: Payer: Medicare Other | Admitting: Family Medicine

## 2018-09-30 ENCOUNTER — Encounter: Payer: Self-pay | Admitting: Family Medicine

## 2018-09-30 DIAGNOSIS — M79662 Pain in left lower leg: Secondary | ICD-10-CM | POA: Insufficient documentation

## 2018-09-30 DIAGNOSIS — M79605 Pain in left leg: Secondary | ICD-10-CM | POA: Insufficient documentation

## 2018-09-30 MED ORDER — DOXYCYCLINE HYCLATE 100 MG PO TABS: 100.0000 mg | ORAL_TABLET | Freq: Two times a day (BID) | ORAL | 0 refills | Status: AC

## 2018-09-30 NOTE — Patient Instructions (Addendum)
Good to see you  Ice 20 minutes 2 times daily. Usually after activity and before bed. pennsaid pinkie amount topically 2 times daily as needed. ' Calf compression or compression socks daily  Avoid being barefoot for now Doxycycline 2 times a day for 1 week and baby aspirin daily to help if it is a vein  See me again in 3-4 weeks

## 2018-09-30 NOTE — Assessment & Plan Note (Signed)
Very nonspecific pain.  Discussed with patient in great length.  Possible superficial phlebitis.  Patient given doxycycline and aspirin.  Differential includes an anterior tibialis tear.  Exercises given, compressions discussed, proper shoes.  Discussed avoiding being barefoot.  Stress reaction of the tibia could also be there and will need to consider further work-up including x-rays if patient does not make response.  Patient was able to walk fairly regularly.  Follow-up with me again 4 to 6 weeks

## 2018-10-07 ENCOUNTER — Ambulatory Visit: Payer: Medicare Other | Admitting: Family Medicine

## 2018-10-30 ENCOUNTER — Ambulatory Visit: Payer: Medicare Other | Admitting: Family Medicine

## 2018-12-31 ENCOUNTER — Ambulatory Visit: Payer: Medicare Other | Admitting: Internal Medicine

## 2018-12-31 ENCOUNTER — Observation Stay (HOSPITAL_BASED_OUTPATIENT_CLINIC_OR_DEPARTMENT_OTHER): Payer: Medicare Other

## 2018-12-31 ENCOUNTER — Emergency Department (HOSPITAL_COMMUNITY): Payer: Medicare Other

## 2018-12-31 ENCOUNTER — Inpatient Hospital Stay (HOSPITAL_COMMUNITY)
Admission: EM | Admit: 2018-12-31 | Discharge: 2019-01-10 | DRG: 234 | Disposition: A | Discharge status: Home / Self Care | Payer: Medicare Other | Attending: Surgery | Admitting: Surgery

## 2018-12-31 ENCOUNTER — Other Ambulatory Visit: Payer: Self-pay

## 2018-12-31 ENCOUNTER — Encounter (HOSPITAL_COMMUNITY): Payer: Self-pay | Admitting: Emergency Medicine

## 2018-12-31 ENCOUNTER — Encounter: Payer: Self-pay | Admitting: Internal Medicine

## 2018-12-31 VITALS — BP 130/90 | HR 56 | Temp 97.6°F | Ht 66.0 in | Wt 189.0 lb

## 2018-12-31 DIAGNOSIS — Z90710 Acquired absence of both cervix and uterus: Secondary | ICD-10-CM

## 2018-12-31 DIAGNOSIS — I208 Other forms of angina pectoris: Secondary | ICD-10-CM

## 2018-12-31 DIAGNOSIS — E8770 Fluid overload, unspecified: Secondary | ICD-10-CM | POA: Diagnosis not present

## 2018-12-31 DIAGNOSIS — D62 Acute posthemorrhagic anemia: Secondary | ICD-10-CM | POA: Diagnosis not present

## 2018-12-31 DIAGNOSIS — Z823 Family history of stroke: Secondary | ICD-10-CM

## 2018-12-31 DIAGNOSIS — D696 Thrombocytopenia, unspecified: Secondary | ICD-10-CM | POA: Diagnosis not present

## 2018-12-31 DIAGNOSIS — I200 Unstable angina: Secondary | ICD-10-CM | POA: Diagnosis not present

## 2018-12-31 DIAGNOSIS — Z86010 Personal history of colonic polyps: Secondary | ICD-10-CM

## 2018-12-31 DIAGNOSIS — Z79899 Other long term (current) drug therapy: Secondary | ICD-10-CM

## 2018-12-31 DIAGNOSIS — I351 Nonrheumatic aortic (valve) insufficiency: Secondary | ICD-10-CM | POA: Diagnosis not present

## 2018-12-31 DIAGNOSIS — I25110 Atherosclerotic heart disease of native coronary artery with unstable angina pectoris: Secondary | ICD-10-CM | POA: Diagnosis not present

## 2018-12-31 DIAGNOSIS — R001 Bradycardia, unspecified: Secondary | ICD-10-CM | POA: Diagnosis present

## 2018-12-31 DIAGNOSIS — K219 Gastro-esophageal reflux disease without esophagitis: Secondary | ICD-10-CM | POA: Diagnosis present

## 2018-12-31 DIAGNOSIS — E119 Type 2 diabetes mellitus without complications: Secondary | ICD-10-CM | POA: Diagnosis present

## 2018-12-31 DIAGNOSIS — Z8249 Family history of ischemic heart disease and other diseases of the circulatory system: Secondary | ICD-10-CM

## 2018-12-31 DIAGNOSIS — Z853 Personal history of malignant neoplasm of breast: Secondary | ICD-10-CM

## 2018-12-31 DIAGNOSIS — Z825 Family history of asthma and other chronic lower respiratory diseases: Secondary | ICD-10-CM

## 2018-12-31 DIAGNOSIS — E785 Hyperlipidemia, unspecified: Secondary | ICD-10-CM | POA: Diagnosis present

## 2018-12-31 DIAGNOSIS — I361 Nonrheumatic tricuspid (valve) insufficiency: Secondary | ICD-10-CM

## 2018-12-31 DIAGNOSIS — Z951 Presence of aortocoronary bypass graft: Secondary | ICD-10-CM

## 2018-12-31 DIAGNOSIS — I10 Essential (primary) hypertension: Secondary | ICD-10-CM | POA: Diagnosis present

## 2018-12-31 DIAGNOSIS — I2510 Atherosclerotic heart disease of native coronary artery without angina pectoris: Secondary | ICD-10-CM

## 2018-12-31 DIAGNOSIS — Z833 Family history of diabetes mellitus: Secondary | ICD-10-CM

## 2018-12-31 DIAGNOSIS — Z09 Encounter for follow-up examination after completed treatment for conditions other than malignant neoplasm: Secondary | ICD-10-CM

## 2018-12-31 DIAGNOSIS — Z923 Personal history of irradiation: Secondary | ICD-10-CM

## 2018-12-31 DIAGNOSIS — Z882 Allergy status to sulfonamides status: Secondary | ICD-10-CM

## 2018-12-31 DIAGNOSIS — Z885 Allergy status to narcotic agent status: Secondary | ICD-10-CM

## 2018-12-31 LAB — I-STAT TROPONIN, ED: Troponin i, poc: 0.01 ng/mL (ref 0.00–0.08)

## 2018-12-31 LAB — CBC WITH DIFFERENTIAL/PLATELET
Abs Immature Granulocytes: 0.02 10*3/uL (ref 0.00–0.07)
Basophils Absolute: 0 10*3/uL (ref 0.0–0.1)
Basophils Relative: 1 %
EOS PCT: 4 %
Eosinophils Absolute: 0.2 10*3/uL (ref 0.0–0.5)
HCT: 39.1 % (ref 36.0–46.0)
Hemoglobin: 12 g/dL (ref 12.0–15.0)
Immature Granulocytes: 0 %
Lymphocytes Relative: 47 %
Lymphs Abs: 2.8 10*3/uL (ref 0.7–4.0)
MCH: 28.2 pg (ref 26.0–34.0)
MCHC: 30.7 g/dL (ref 30.0–36.0)
MCV: 91.8 fL (ref 80.0–100.0)
Monocytes Absolute: 0.7 10*3/uL (ref 0.1–1.0)
Monocytes Relative: 11 %
Neutro Abs: 2.2 10*3/uL (ref 1.7–7.7)
Neutrophils Relative %: 37 %
PLATELETS: 256 10*3/uL (ref 150–400)
RBC: 4.26 MIL/uL (ref 3.87–5.11)
RDW: 12.8 % (ref 11.5–15.5)
WBC: 5.9 10*3/uL (ref 4.0–10.5)
nRBC: 0 % (ref 0.0–0.2)

## 2018-12-31 LAB — BASIC METABOLIC PANEL
Anion gap: 10 (ref 5–15)
BUN: 10 mg/dL (ref 8–23)
CALCIUM: 9.5 mg/dL (ref 8.9–10.3)
CO2: 27 mmol/L (ref 22–32)
CREATININE: 1.04 mg/dL — AB (ref 0.44–1.00)
Chloride: 103 mmol/L (ref 98–111)
GFR calc Af Amer: >60 mL/min (ref >60)
GFR calc non Af Amer: 55 mL/min — ABNORMAL LOW (ref >60)
Glucose, Bld: 94 mg/dL (ref 70–99)
Potassium: 3.7 mmol/L (ref 3.5–5.1)
Sodium: 140 mmol/L (ref 135–145)

## 2018-12-31 LAB — ECHOCARDIOGRAM COMPLETE
Height: 66 in
WEIGHTICAEL: 3022.95 [oz_av]

## 2018-12-31 LAB — TROPONIN I
Troponin I: <0.03 ng/mL (ref <0.03)
Troponin I: <0.03 ng/mL (ref <0.03)

## 2018-12-31 MED ORDER — SODIUM CHLORIDE 0.9% FLUSH
3.0000 mL | Freq: Two times a day (BID) | INTRAVENOUS | Status: DC
  Administered 2018-12-31 – 2019-01-01 (×2): 3 mL via INTRAVENOUS

## 2018-12-31 MED ORDER — SODIUM CHLORIDE 0.9 % IV SOLN
INTRAVENOUS | Status: DC
  Administered 2018-12-31: 17:00:00 via INTRAVENOUS

## 2018-12-31 MED ORDER — ASPIRIN 81 MG PO CHEW: 81.0000 mg | CHEWABLE_TABLET | ORAL | Status: AC

## 2018-12-31 MED ORDER — SODIUM CHLORIDE 0.9 % IV SOLN: 250.0000 mL | INTRAVENOUS | Status: DC | PRN

## 2018-12-31 MED ORDER — ACETAMINOPHEN 325 MG PO TABS: 650.0000 mg | ORAL_TABLET | ORAL | Status: DC | PRN

## 2018-12-31 MED ORDER — SODIUM CHLORIDE 0.9% FLUSH: 3.0000 mL | INTRAVENOUS | Status: DC | PRN

## 2018-12-31 MED ORDER — SODIUM CHLORIDE 0.9 % IV SOLN: INTRAVENOUS | Status: DC

## 2018-12-31 MED ORDER — NITROGLYCERIN 0.4 MG SL SUBL: 0.4000 mg | SUBLINGUAL_TABLET | SUBLINGUAL | Status: DC | PRN

## 2018-12-31 MED ORDER — ASPIRIN 81 MG PO CHEW
81.0000 mg | CHEWABLE_TABLET | Freq: Every day | ORAL | Status: DC
  Administered 2018-12-31 – 2019-01-01 (×2): 81 mg via ORAL
  Filled 2018-12-31 (×2): qty 1

## 2018-12-31 MED ORDER — ATORVASTATIN CALCIUM 80 MG PO TABS
80.0000 mg | ORAL_TABLET | Freq: Every day | ORAL | Status: DC
  Administered 2019-01-01 – 2019-01-09 (×7): 80 mg via ORAL
  Filled 2018-12-31 (×9): qty 1

## 2018-12-31 MED ORDER — ONDANSETRON HCL 4 MG/2ML IJ SOLN: 4.0000 mg | Freq: Four times a day (QID) | INTRAMUSCULAR | Status: DC | PRN

## 2018-12-31 NOTE — Progress Notes (Signed)
  Echocardiogram 2D Echocardiogram has been performed.  Jessica [name] 12/31/2018, 5:17 PM

## 2018-12-31 NOTE — Assessment & Plan Note (Signed)
Symptoms are very consistent with stable angina however EKG with changes and since we cannot visualize old EKG from 2012 it is unclear if these represent new changes. She is advised to go to ER and offered EMS however she elects to drive herself. We have notified the ER to let them know she is coming.

## 2018-12-31 NOTE — Progress Notes (Signed)
   Subjective:   Patient ID: Jessica [name], female    DOB: 1951-04-21, 68 y.o.   MRN: [id number]  HPI The patient is a 68 YO female coming in for SOB with chest pain. This started about 2 weeks ago. She noticed it first when walking longer distances. [place] her [place] she got SOB and chest pain in the middle of chest and pressure like something was sitting on it. She got to [place] and sat down but was feeling so terrible she was not sure she was going to be able to stay. This passed in 10-15 minutes with rest. She denies sweating, nausea with the chest pain. She has gotten this pain with similar length distance. Not with shorter distances like around the house or to mailbox (fairly close to house). She denies change in diet or exercise. She denies cold or flu symptoms at the onset. She is getting SOB with longer distances and feeling some fatigued. Denies fevers or chills. Denies stomach problems. Denies taking anything for this. Denies past known history of CAD. Denies taking aspirin in the last 2 weeks.   PMH, [facility], social history reviewed and updated  Review of Systems  Constitutional: Positive for fatigue. Negative for activity change, appetite change, fever and unexpected weight change.  HENT: Negative.   Eyes: Negative.   Respiratory: Positive for shortness of breath. Negative for cough and chest tightness.   Cardiovascular: Positive for chest pain. Negative for palpitations and leg swelling.  Gastrointestinal: Negative for abdominal distention, abdominal pain, constipation, diarrhea, nausea and vomiting.  Musculoskeletal: Negative.   Skin: Negative.   Neurological: Negative.   Psychiatric/Behavioral: Negative.     Objective:  Physical Exam Constitutional:      Appearance: She is well-developed.  HENT:     Head: Normocephalic and atraumatic.  Neck:     Musculoskeletal: Normal range of motion.  Cardiovascular:     Rate and Rhythm: Normal rate and regular rhythm.    Pulmonary:     Effort: Pulmonary effort is normal. No respiratory distress.     Breath sounds: Normal breath sounds. No wheezing or rales.  Abdominal:     General: Bowel sounds are normal. There is no distension.     Palpations: Abdomen is soft.     Tenderness: There is no abdominal tenderness. There is no rebound.  Skin:    General: Skin is warm and dry.  Neurological:     Mental Status: She is alert and oriented to person, place, and time.     Coordination: Coordination normal.     Vitals:   12/31/18 0756  BP: 130/90  Pulse: (!) 56  Temp: 97.6 F (36.4 C)  TempSrc: Oral  SpO2: 97%  Weight: 189 lb (85.7 kg)  Height: 5\' 6"  (1.676 m)   EKG: Rate 60, axis okay, sinus with some ectopic ventricular beats, intervals 1st degree AV block, there are st/t wave changes, concerning for ST elevation or age indetermined infarct, no prior to compare in our system, old in care everywhere but am not able to view  Assessment & Plan:

## 2018-12-31 NOTE — H&P (Addendum)
Cardiology Admission History and Physical:   Jessica Jessica MRN: [id number]; DOB: [birth year]   Admission date: 12/31/2018  Primary Care Provider: Hoyt Koch, MD Primary Cardiologist: New -Dr. Gwenlyn Found Primary Electrophysiologist:  None   Chief Complaint:  Chest pain  Jessica Profile:   Jessica Jessica is a 68 y.o. female with PMH of HLD, prediabetes and R breast CA s/p radiation and R lumpectomy presented with worsening exertional chest pain  History of Present Illness:   Jessica Jessica is a 68 year old female with PMH of HLD, prediabetes, and history of R breast CA s/p radiation therapy and right lumpectomy (in remission).  She says she was previously diagnosed with hypertension several years ago and was placed on blood pressure medication for a while, she has since came off of blood pressure medication as she no longer needs it.  She has been followed by [facility] radiology service with a mammogram to check for recurrence of breast cancer, so far she has not had any recurrence.  She was in her usual state of health until a month ago when she started noticing increasing exertional dyspnea and fatigue.  Last week, she started noticing exertional substernal chest tightness.  Jessica chest tightness is located centrally under Jessica sternum and does not radiate.  It mainly occurs with physical exertion after she walk a certain distance.  2 days ago, she woke up in Jessica morning with 20 minutes of chest tightness.  It eventually went away by itself.  This prompted her to seek medical attention with her primary care provider.  Last night, while walking in Jessica parking lot to her car, she noted recurrent chest tightness.  She was seen by her PCP this morning, EKG showed a sinus bradycardia.  Her symptom is concerning for unstable angina therefore she was sent to [facility] ED for further evaluation.  Initial EKG showed a sinus bradycardia with heart rate of 50.  Point-of-care troponin is negative at  0.01.  Otherwise stable renal function, electrolyte and red blood cell count.  Chest x-ray was negative for acute process.   Past Medical History:  Diagnosis Date   Allergy    Breast cancer ([place]) 2000   Cancer ([place])    breast   Colon polyp    GERD (gastroesophageal reflux disease)    Hyperlipidemia    Migraine    Personal history of radiation therapy 2000    Past Surgical History:  Procedure Laterality Date   ABDOMINAL HYSTERECTOMY     BREAST LUMPECTOMY Right 2000   BREAST SURGERY     EYE SURGERY     hemorrhoid       Medications Prior to Admission: Prior to Admission medications   Medication Sig Start Date End Date Taking? Authorizing Provider  Digestive Enzymes (DIGESTIVE ENZYME PO) Take by mouth.    [provider]  loratadine (CLARITIN) 10 MG tablet Take 10 mg by mouth daily.     [provider]  Multiple Vitamin (MULTI VITAMIN DAILY PO) Take by mouth.    [provider]  simvastatin (ZOCOR) 20 MG tablet Take 1 tablet (20 mg total) by mouth at bedtime. 09/24/18   Jessica Koch, MD  Vitamin D, Cholecalciferol, 1000 units CAPS Take by mouth. 06/15/10   [provider]     Allergies:    Allergies  Allergen Reactions   Codeine    Sulfa Antibiotics    Sulfacetamide     Other reaction(s): Other (See Comments) swelling  Social History:   Social History   Socioeconomic History   Marital status: Married    Spouse name: Not on file   Number of children: Not on file   Years of education: Not on file   Highest education level: Master's degree (e.g., MA, MS, MEng, MEd, MSW, MBA)  Occupational History   Not on file  Social Needs   Financial resource strain: Not hard at all   Food insecurity:    Worry: Never true    Inability: Never true   Transportation needs:    Medical: No    Non-medical: No  Tobacco Use   Smoking status: Never Smoker   Smokeless tobacco: Never Used  Substance and Sexual  Activity   Alcohol use: No    Frequency: Never   Drug use: No   Sexual activity: Yes  Lifestyle   Physical activity:    Days per week: 0 days    Minutes per session: 0 min   Stress: To some extent  Relationships   Social connections:    Talks on phone: More than three times a week    Gets together: More than three times a week    Attends religious service: More than 4 times per year    Active member of club or organization: Not on file    Attends meetings of clubs or organizations: More than 4 times per year    Relationship status: Married   Intimate partner violence:    Fear of current or ex partner: No    Emotionally abused: No    Physically abused: No    Forced sexual activity: No  Other Topics Concern   Not on file  Social History Narrative   Not on file    Family History:   Jessica Jessica's family history includes Asthma in her paternal aunt; Diabetes in her paternal aunt; Heart disease in her brother; Kidney disease in her paternal uncle; Stroke in her paternal uncle.    ROS:  Please see Jessica history of present illness.  All other ROS reviewed and negative.     Physical Exam/Data:   Vitals:   12/31/18 1045 12/31/18 1100 12/31/18 1115 12/31/18 1130  BP: (!) 160/80 (!) 153/86 (!) 144/75 (!) 118/97  Pulse: (!) 44 (!) 44 (!) 45 (!) 47  Resp: 15 13 13 10   SpO2: 99% 96% 99% 99%  Weight:       No intake or output data in Jessica 24 hours ending 12/31/18 1218 Last 3 Weights 12/31/2018 12/31/2018 09/30/2018  Weight (lbs) 188 lb 15 oz 189 lb 189 lb  Weight (kg) 85.7 kg 85.73 kg 85.73 kg     Body mass index is 30.49 kg/m.  General:  Well nourished, well developed, in no acute distress HEENT: normal Lymph: no adenopathy Neck: no JVD Endocrine:  No thryomegaly Vascular: No carotid bruits; FA pulses 2+ bilaterally without bruits  Cardiac:  normal S1, S2; RRR; no murmur  Lungs:  clear to auscultation bilaterally, no wheezing, rhonchi or rales  Abd: soft, nontender,  no hepatomegaly  Ext: no edema Musculoskeletal:  No deformities, BUE and BLE strength normal and equal Skin: warm and dry  Neuro:  CNs 2-12 intact, no focal abnormalities noted Psych:  Normal affect    EKG:  Jessica ECG that was done in Jessica ED and was personally reviewed and demonstrates sinus bradycardia without significant ST-T wave changes  Relevant CV Studies:  N/A  Laboratory Data:  Chemistry Recent Labs  Lab 12/31/18 [phone]  NA 140  K 3.7  CL 103  CO2 27  GLUCOSE 94  BUN 10  CREATININE 1.04*  CALCIUM 9.5  GFRNONAA 55*  GFRAA >60  ANIONGAP 10    No results for input(s): PROT, ALBUMIN, AST, ALT, ALKPHOS, BILITOT in Jessica last 168 hours. Hematology Recent Labs  Lab 12/31/18 0918  WBC 5.9  RBC 4.26  HGB 12.0  HCT 39.1  MCV 91.8  MCH 28.2  MCHC 30.7  RDW 12.8  PLT 256   Cardiac EnzymesNo results for input(s): TROPONINI in Jessica last 168 hours.  Recent Labs  Lab 12/31/18 0916  TROPIPOC 0.01    BNPNo results for input(s): BNP, PROBNP in Jessica last 168 hours.  DDimer No results for input(s): DDIMER in Jessica last 168 hours.  Radiology/Studies:  Dg Chest 2 View  Result Date: 12/31/2018 CLINICAL DATA:  Chest pain over Jessica last week with abnormal EKG. EXAM: CHEST - 2 VIEW COMPARISON:  None. FINDINGS: Jessica heart is enlarged with left ventricular prominence. Mild tortuosity of Jessica aorta. Jessica lungs are clear. Jessica pulmonary vascularity is normal. No effusions. No significant bone finding. IMPRESSION: No active disease.  Left ventricular prominence.  Tortuous aorta. Electronically Signed   By: Nelson Chimes M.D.   On: 12/31/2018 10:11    Assessment and Plan:   1. Unstable angina:   - Symptoms concerning for unstable angina with progressive exertional chest pain and first occurrence of pain at rest 2 days ago.    - EKG did not show any ischemic changes however does show sinus bradycardia which she is asymptomatic for.    - cardiac risk factor age, HLD and prediabetes (likely  HTN as well). Brother had CHF.  - Discussed with Jessica Jessica regarding various options including stress test versus cardiac catheterization.  I am in favor cardiac catheterization (risk and benefit discussed with Jessica Jessica).  Will defer to MD. NPO for now.   2. Asymptomatic sinus bradycardia: According to Jessica Jessica, she has a longstanding history of bradycardia.  However on further review, her previous heart rate has always been in Jessica low 50s, while in Jessica emergency room, her heart rate frequently dipped down to this high 40s.  She denies any dizziness, blurred vision or feeling of passing out.  3. Hypertension: She carries a prior diagnosis of hypertension however according to Jessica Jessica, she has come off of blood pressure medication because she was told she no longer needs it. No BB given significant bradycardia  4. Hyperlipidemia: Last lipid panel obtained on 09/17/2018 showed total cholesterol 236, LDL 174, HDL 40, triglyceride 106.  5. Prediabetes: Recent lab work shows hemoglobin A1c 6.0.   Severity of Illness: Jessica appropriate Jessica status for this Jessica is OBSERVATION. Observation status is judged to be reasonable and necessary in order to provide Jessica required intensity of service to ensure Jessica Jessica's safety. Jessica Jessica's presenting symptoms, physical exam findings, and initial radiographic and laboratory data in Jessica context of their medical condition is felt to place them at decreased risk for further clinical deterioration. Furthermore, it is anticipated that Jessica Jessica will be medically stable for discharge from Jessica hospital within 2 midnights of admission. Jessica following factors support Jessica Jessica status of observation.   " Jessica Jessica's presenting symptoms include chest pain. " Jessica physical exam findings include bradycardia. " Jessica initial radiographic and laboratory data are Normal, sinus bradycardia on EKG.     For questions or updates, please contact [place] Please consult [email or web address]  for contact info under        Signed, Almyra Deforest, [place]  12/31/2018 12:18 PM   Agree with note by Almyra Deforest PA-C  68 year old moderately overweight weight African-American female with minimal cardiac risk factors admitted with unstable angina.  She is never had a heart attack or stroke.  Her risk factors include untreated hypertension, untreated hyperlipidemia and borderline diabetes.  There is no family history.  She does not smoke.  She was a Pharmacist, hospital in Jessica past.  She describes several weeks of accelerated angina worse yesterday when walking to [place].  Jessica pain resolved when she rested.  She came to Jessica ER where she is pain-free.  Her exam is benign.  Her enzymes are negative.  EKG shows sinus bradycardia without acute changes.  Her history is worrisome for crescendo angina and she will need diagnostic coronary angiography which will be performed later today by Dr. Claiborne Billings..  I have reviewed Jessica risks, indications, and alternatives to cardiac catheterization, possible angioplasty, and stenting with Jessica Jessica. Risks include but are not limited to bleeding, infection, vascular injury, stroke, myocardial infection, arrhythmia, kidney injury, radiation-related injury in Jessica case of prolonged fluoroscopy use, emergency cardiac surgery, and death. Jessica Jessica understands Jessica risks of serious complication is 1-2 in 6270 with diagnostic cardiac cath and 1-2% or less with angioplasty/stenting.    Lorretta Harp, M.D., [place], [facility], [place] [address]. [place], [place]  [address]  [phone] 12/31/2018 12:49 PM

## 2018-12-31 NOTE — Patient Instructions (Signed)
Go to [facility] emergency room to get evaluated.

## 2018-12-31 NOTE — ED Notes (Signed)
ED TO INPATIENT HANDOFF REPORT  ED Nurse Name and Phone #: [phone]  S Name/Age/Gender Nicholas Lose  68 y.o. female Room/Bed: 029C/029C  Code Status   Code Status: Not on file  Home/SNF/Other Home Patient oriented to: self, place, time and situation Is this baseline? Yes   Triage Complete: Triage complete  Chief Complaint cp  Triage Note Pt sent from doctor's office - cc of central cp w/sob and worse with exertion. EKG done at doctor's office, was concerning for blockage per pt. Denies n/v, dizziness or cough   Allergies Allergies  Allergen Reactions  . Codeine   . Sulfa Antibiotics   . Sulfacetamide     Other reaction(s): Other (See Comments) swelling    Level of Care/Admitting Diagnosis ED Disposition    ED Disposition Condition Comment   Admit  Hospital Area: [facility] [[id number]]  Level of Care: Cardiac Telemetry [103]  Diagnosis: Unstable angina [facility]) [[id number]]  Admitting Physician: Lorretta Harp [[id number]]  Attending Physician: Lorretta Harp [[id number]]  PT Class (Do Not Modify): Observation [104]  PT Acc Code (Do Not Modify): Observation [10022]       B Medical/Surgery History Past Medical History:  Diagnosis Date  . Allergy   . Breast cancer ([place]) 2000  . Cancer [facility])    breast  . Colon polyp   . GERD (gastroesophageal reflux disease)   . Hyperlipidemia   . Migraine   . Personal history of radiation therapy 2000   Past Surgical History:  Procedure Laterality Date  . ABDOMINAL HYSTERECTOMY    . BREAST LUMPECTOMY Right 2000  . BREAST SURGERY    . EYE SURGERY    . hemorrhoid       A IV Location/Drains/Wounds Patient Lines/Drains/Airways Status   Active Line/Drains/Airways    Name:   Placement date:   Placement time:   Site:   Days:   Peripheral IV 12/31/18 Left Antecubital   12/31/18    0910    Antecubital   less than 1          Intake/Output Last 24 hours No intake or output data in the 24 hours ending 12/31/18  1432  Labs/Imaging Results for orders placed or performed during the hospital encounter of 12/31/18 (from the past 48 hour(s))  I-Stat Troponin, ED (not at [facility])     Status: None   Collection Time: 12/31/18  9:16 AM  Result Value Ref Range   Troponin i, poc 0.01 0.00 - 0.08 ng/mL   Comment 3            Comment: Due to the release kinetics of cTnI, a negative result within the first hours of the onset of symptoms does not rule out myocardial infarction with certainty. If myocardial infarction is still suspected, repeat the test at appropriate intervals.   Basic metabolic panel     Status: Abnormal   Collection Time: 12/31/18  9:18 AM  Result Value Ref Range   Sodium 140 135 - 145 mmol/L   Potassium 3.7 3.5 - 5.1 mmol/L   Chloride 103 98 - 111 mmol/L   CO2 27 22 - 32 mmol/L   Glucose, Bld 94 70 - 99 mg/dL   BUN 10 8 - 23 mg/dL   Creatinine, Ser 1.04 (H) 0.44 - 1.00 mg/dL   Calcium 9.5 8.9 - 10.3 mg/dL   GFR calc non Af Amer 55 (L) >60 mL/min   GFR calc Af Amer >60 >60 mL/min   Anion gap 10  5 - 15    Comment: Performed at [facility], [place] [address] [place], [place] [address]  CBC with Differential     Status: None   Collection Time: 12/31/18  9:18 AM  Result Value Ref Range   WBC 5.9 4.0 - 10.5 K/uL   RBC 4.26 3.87 - 5.11 MIL/uL   Hemoglobin 12.0 12.0 - 15.0 g/dL   HCT 39.1 36.0 - 46.0 %   MCV 91.8 80.0 - 100.0 fL   MCH 28.2 26.0 - 34.0 pg   MCHC 30.7 30.0 - 36.0 g/dL   RDW 12.8 11.5 - 15.5 %   Platelets 256 150 - 400 K/uL   nRBC 0.0 0.0 - 0.2 %   Neutrophils Relative % 37 %   Neutro Abs 2.2 1.7 - 7.7 K/uL   Lymphocytes Relative 47 %   Lymphs Abs 2.8 0.7 - 4.0 K/uL   Monocytes Relative 11 %   Monocytes Absolute 0.7 0.1 - 1.0 K/uL   Eosinophils Relative 4 %   Eosinophils Absolute 0.2 0.0 - 0.5 K/uL   Basophils Relative 1 %   Basophils Absolute 0.0 0.0 - 0.1 K/uL   Immature Granulocytes 0 %   Abs Immature Granulocytes 0.02 0.00 - 0.07 K/uL    Comment:  Performed at [facility], [address], [place], [place] [address]   Dg Chest 2 View  Result Date: 12/31/2018 CLINICAL DATA:  Chest pain over the last week with abnormal EKG. EXAM: CHEST - 2 VIEW COMPARISON:  None. FINDINGS: The heart is enlarged with left ventricular prominence. Mild tortuosity of the aorta. The lungs are clear. The pulmonary vascularity is normal. No effusions. No significant bone finding. IMPRESSION: No active disease.  Left ventricular prominence.  Tortuous aorta. Electronically Signed   By: Nelson Chimes M.D.   On: 12/31/2018 10:11    Pending Labs Unresulted Labs (From admission, onward)    Start     Ordered   12/31/18 1338  Troponin I - Now Then Q6H  Now then every 6 hours,   STAT     12/31/18 1337   Signed and Held  HIV antibody (Routine Testing)  Once,   R     Signed and Held   Signed and Held  Basic metabolic panel  Tomorrow morning,   R     Signed and Held   Signed and Held  Lipid panel  Tomorrow morning,   R     Signed and Held          Vitals/Pain Today's Vitals   12/31/18 1130 12/31/18 1200 12/31/18 1215 12/31/18 1230  BP: (!) 118/97 (!) 166/87 (!) 161/75 (!) 155/79  Pulse: (!) 47 (!) 47    Resp: 10 12    SpO2: 99% 100% 100% 100%  Weight:      PainSc:        Isolation Precautions No active isolations  Medications Medications  0.9 %  sodium chloride infusion (has no administration in time range)  atorvastatin (LIPITOR) tablet 80 mg (has no administration in time range)  aspirin chewable tablet 81 mg (has no administration in time range)  aspirin chewable tablet 81 mg (has no administration in time range)  0.9 %  sodium chloride infusion (has no administration in time range)    Mobility walks Low fall risk   Focused Assessments Cardiac Assessment Handoff:    No results found for: CKTOTAL, CKMB, CKMBINDEX, TROPONINI No results found for: DDIMER Does the Patient currently have  chest pain? No       R Recommendations: See  Admitting Provider Note  Report given to:   Additional Notes:

## 2018-12-31 NOTE — ED Provider Notes (Signed)
[facility] EMERGENCY DEPARTMENT Provider Note   CSN: [id number] Arrival date & time: 12/31/18  0847    History   Chief Complaint Chief Complaint  Patient presents with  . Chest Pain  . Sent by doctor    HPI Jessica [name] is a 68 y.o. female w PMHx HLD, presenting to the ED with complaint of 1 month of intermittent worsening chest pain. Pain is described as a tightness, worse with exertion and improved with rest.  She also has some shortness of breath with the chest pain.  No medications tried for pain.  Denies associated diaphoresis, nausea, vomiting, radiation of pain, leg swelling.  No known history of CAD, however patient states "somebody thought I may have had a heart attack before."  States she had a stress test that she thinks was normal.  She was evaluated by her PCP today, who sent her here with concern for stable angina and abnormal EKG in clinic.  Last episode of chest pain was yesterday afternoon.  She is currently asymptomatic in the ED.  Per chart review, no cardiac imaging documented.    The history is provided by the patient and medical records.    Past Medical History:  Diagnosis Date  . Allergy   . Breast cancer ([place]) 2000  . Cancer [facility])    breast  . Colon polyp   . GERD (gastroesophageal reflux disease)   . Hyperlipidemia   . Migraine   . Personal history of radiation therapy 2000    Patient Active Problem List   Diagnosis Date Noted  . Stable angina pectoris ([place]) 12/31/2018  . Unstable angina ([place]) 12/31/2018  . Pain in left shin 09/30/2018  . Degenerative joint disease of low back 01/09/2017  . Routine general medical examination at a health care facility 10/02/2016  . GERD (gastroesophageal reflux disease) 07/28/2016  . H/O malignant neoplasm of breast 06/19/2016    Past Surgical History:  Procedure Laterality Date  . ABDOMINAL HYSTERECTOMY    . BREAST LUMPECTOMY Right 2000  . BREAST SURGERY    . EYE SURGERY    .  hemorrhoid       OB History   No obstetric history on file.      Home Medications    Prior to Admission medications   Medication Sig Start Date End Date Taking? Authorizing Provider  Digestive Enzymes (DIGESTIVE ENZYME PO) Take 1 capsule by mouth 2 (two) times daily.    Yes [provider]  loratadine (CLARITIN) 10 MG tablet Take 10 mg by mouth daily.    Yes [provider]  Multiple Vitamin (MULTI VITAMIN DAILY PO) Take 1 tablet by mouth daily.    Yes [provider]  simvastatin (ZOCOR) 20 MG tablet Take 1 tablet (20 mg total) by mouth at bedtime. 09/24/18  Yes Hoyt Koch, MD  Vitamin D, Cholecalciferol, 1000 units CAPS Take 1,000 Units by mouth 2 (two) times daily.  06/15/10  Yes [provider]    Family History Family History  Problem Relation Age of Onset  . Heart disease Brother   . Asthma Paternal Aunt   . Diabetes Paternal Aunt   . Stroke Paternal Uncle   . Kidney disease Paternal Uncle     Social History Social History   Tobacco Use  . Smoking status: Never Smoker  . Smokeless tobacco: Never Used  Substance Use Topics  . Alcohol use: No    Frequency: Never  . Drug use: No  Allergies   Codeine; Sulfa antibiotics; and Sulfacetamide   Review of Systems Review of Systems  Constitutional: Negative for diaphoresis.  Respiratory: Positive for shortness of breath.   Cardiovascular: Positive for chest pain. Negative for palpitations and leg swelling.  Gastrointestinal: Negative for abdominal pain, nausea and vomiting.  All other systems reviewed and are negative.    Physical Exam Updated Vital Signs BP (!) 155/79   Pulse (!) 47   Resp 12   Wt 85.7 kg   SpO2 100%   BMI 30.49 kg/m   Physical Exam Vitals signs and nursing note reviewed.  Constitutional:      General: She is not in acute distress.    Appearance: She is well-developed. She is not ill-appearing.  HENT:     Head: Normocephalic and  atraumatic.     Mouth/Throat:     Mouth: Mucous membranes are moist.  Eyes:     Conjunctiva/sclera: Conjunctivae normal.  Neck:     Musculoskeletal: Normal range of motion and neck supple. No neck rigidity or muscular tenderness.  Cardiovascular:     Rate and Rhythm: Normal rate and regular rhythm.     Heart sounds: Normal heart sounds.  Pulmonary:     Effort: Pulmonary effort is normal. No respiratory distress.  Abdominal:     General: Bowel sounds are normal.     Palpations: Abdomen is soft.     Tenderness: There is no abdominal tenderness. There is no guarding or rebound.     Hernia: No hernia is present.  Musculoskeletal:     Comments: Trace LE edema b/l.  Lymphadenopathy:     Cervical: No cervical adenopathy.  Skin:    General: Skin is warm.  Neurological:     Mental Status: She is alert.  Psychiatric:        Behavior: Behavior normal.      ED Treatments / Results  Labs (all labs ordered are listed, but only abnormal results are displayed) Labs Reviewed  BASIC METABOLIC PANEL - Abnormal; Notable for the following components:      Result Value   Creatinine, Ser 1.04 (*)    GFR calc non Af Amer 55 (*)    All other components within normal limits  CBC WITH DIFFERENTIAL/PLATELET  I-STAT TROPONIN, ED    EKG EKG Interpretation  Date/Time:  Wednesday December 31 2018 08:56:36 EDT Ventricular Rate:  50 PR Interval:    QRS Duration: 102 QT Interval:  489 QTC Calculation: 446 R Axis:   28 Text Interpretation:  Sinus rhythm Borderline prolonged PR interval Low voltage, precordial leads No acute changes No significant change since last tracing Confirmed by Varney Biles [phone]) on 12/31/2018 11:01:23 AM   Radiology Dg Chest 2 View  Result Date: 12/31/2018 CLINICAL DATA:  Chest pain over the last week with abnormal EKG. EXAM: CHEST - 2 VIEW COMPARISON:  None. FINDINGS: The heart is enlarged with left ventricular prominence. Mild tortuosity of the aorta. The lungs are  clear. The pulmonary vascularity is normal. No effusions. No significant bone finding. IMPRESSION: No active disease.  Left ventricular prominence.  Tortuous aorta. Electronically Signed   By: Nelson Chimes M.D.   On: 12/31/2018 10:11    Procedures Procedures (including critical care time)  Medications Ordered in ED Medications  0.9 %  sodium chloride infusion (has no administration in time range)  atorvastatin (LIPITOR) tablet 80 mg (has no administration in time range)  aspirin chewable tablet 81 mg (has no administration in time range)  Initial Impression / Assessment and Plan / ED Course  I have reviewed the triage vital signs and the nursing notes.  Pertinent labs & imaging results that were available during my care of the patient were reviewed by me and considered in my medical decision making (see chart for details).        Patient without known cardiac history, presenting to the emergency department with 1 month of worsening chest pain on exertion, improves with rest.  Seen at PCP today with abnormal EKG and sent with concern for stable angina.  Last episode of chest pain was yesterday, currently asymptomatic in the ED.  Exam is overall unremarkable.  Initial troponin is negative.  EKG without ischemic changes here.  Consult placed to cardiology, who evaluated patient in the ED and recommends admission for cardiac catheterization with concern for unstable angina.  Dr. Gwenlyn Found admitting.  Patient discussed with Dr. Kathrynn Humble.  The patient appears reasonably stabilized for admission considering the current resources, flow, and capabilities available in the ED at this time, and I doubt any other [facility] requiring further screening and/or treatment in the ED prior to admission.  Final Clinical Impressions(s) / ED Diagnoses   Final diagnoses:  Unstable angina [facility])    ED Discharge Orders    None       Saidy Ormand, [place] N, PA-C 12/31/18 Ettore Trebilcock, Ankit, MD 01/02/19  1825

## 2018-12-31 NOTE — ED Triage Notes (Signed)
Pt sent from doctor's office - cc of central cp w/sob and worse with exertion. EKG done at doctor's office, was concerning for blockage per pt. Denies n/v, dizziness or cough

## 2019-01-01 ENCOUNTER — Observation Stay (HOSPITAL_BASED_OUTPATIENT_CLINIC_OR_DEPARTMENT_OTHER): Payer: Medicare Other

## 2019-01-01 ENCOUNTER — Other Ambulatory Visit: Payer: Self-pay | Admitting: *Deleted

## 2019-01-01 ENCOUNTER — Encounter (HOSPITAL_COMMUNITY)
Admission: EM | Disposition: A | Discharge status: Home / Self Care | Payer: Self-pay | Source: Home / Self Care | Attending: Cardiovascular Disease

## 2019-01-01 ENCOUNTER — Encounter (HOSPITAL_COMMUNITY): Payer: Self-pay | Admitting: Interventional Cardiology

## 2019-01-01 DIAGNOSIS — Z01810 Encounter for preprocedural cardiovascular examination: Secondary | ICD-10-CM

## 2019-01-01 DIAGNOSIS — I25110 Atherosclerotic heart disease of native coronary artery with unstable angina pectoris: Secondary | ICD-10-CM

## 2019-01-01 DIAGNOSIS — I2510 Atherosclerotic heart disease of native coronary artery without angina pectoris: Secondary | ICD-10-CM

## 2019-01-01 HISTORY — PX: LEFT HEART CATH AND CORONARY ANGIOGRAPHY: CATH118249

## 2019-01-01 LAB — BASIC METABOLIC PANEL
Anion gap: 11 (ref 5–15)
BUN: 9 mg/dL (ref 8–23)
CHLORIDE: 106 mmol/L (ref 98–111)
CO2: 22 mmol/L (ref 22–32)
Calcium: 8.6 mg/dL — ABNORMAL LOW (ref 8.9–10.3)
Creatinine, Ser: 0.93 mg/dL (ref 0.44–1.00)
GFR calc Af Amer: >60 mL/min (ref >60)
GFR calc non Af Amer: >60 mL/min (ref >60)
Glucose, Bld: 89 mg/dL (ref 70–99)
Potassium: 3.4 mmol/L — ABNORMAL LOW (ref 3.5–5.1)
Sodium: 139 mmol/L (ref 135–145)

## 2019-01-01 LAB — LIPID PANEL
CHOLESTEROL: 223 mg/dL — AB (ref 0–200)
HDL: 33 mg/dL — ABNORMAL LOW (ref >40)
LDL Cholesterol: 166 mg/dL — ABNORMAL HIGH (ref 0–99)
Total CHOL/HDL Ratio: 6.8 RATIO
Triglycerides: 122 mg/dL (ref <150)
VLDL: 24 mg/dL (ref 0–40)

## 2019-01-01 LAB — HIV ANTIBODY (ROUTINE TESTING W REFLEX): HIV Screen 4th Generation wRfx: NONREACTIVE

## 2019-01-01 LAB — TROPONIN I: Troponin I: <0.03 ng/mL (ref <0.03)

## 2019-01-01 SURGERY — LEFT HEART CATH AND CORONARY ANGIOGRAPHY
Anesthesia: LOCAL

## 2019-01-01 MED ORDER — HEPARIN (PORCINE) 25000 UT/250ML-% IV SOLN: 900.0000 [IU]/h | INTRAVENOUS | Status: DC

## 2019-01-01 MED ORDER — MIDAZOLAM HCL 2 MG/2ML IJ SOLN
INTRAMUSCULAR | Status: DC | PRN
  Administered 2019-01-01: 2 mg via INTRAVENOUS

## 2019-01-01 MED ORDER — HEPARIN BOLUS VIA INFUSION: 4000.0000 [IU] | Freq: Once | INTRAVENOUS | Status: DC

## 2019-01-01 MED ORDER — ZOLPIDEM TARTRATE 5 MG PO TABS
5.0000 mg | ORAL_TABLET | Freq: Every evening | ORAL | Status: DC | PRN
  Administered 2019-01-04: 5 mg via ORAL
  Filled 2019-01-01: qty 1

## 2019-01-01 MED ORDER — SODIUM CHLORIDE 0.9 % IV SOLN: 250.0000 mL | INTRAVENOUS | Status: DC | PRN

## 2019-01-01 MED ORDER — VERAPAMIL HCL 2.5 MG/ML IV SOLN
INTRAVENOUS | Status: AC
  Filled 2019-01-01: qty 2

## 2019-01-01 MED ORDER — HEPARIN (PORCINE) IN NACL 1000-0.9 UT/500ML-% IV SOLN
INTRAVENOUS | Status: AC
  Filled 2019-01-01: qty 500

## 2019-01-01 MED ORDER — LIDOCAINE HCL (PF) 1 % IJ SOLN
INTRAMUSCULAR | Status: AC
  Filled 2019-01-01: qty 30

## 2019-01-01 MED ORDER — ACETAMINOPHEN 325 MG PO TABS: 650.0000 mg | ORAL_TABLET | ORAL | Status: DC | PRN

## 2019-01-01 MED ORDER — SODIUM CHLORIDE 0.9% FLUSH
3.0000 mL | Freq: Two times a day (BID) | INTRAVENOUS | Status: DC
  Administered 2019-01-01 – 2019-01-04 (×4): 3 mL via INTRAVENOUS

## 2019-01-01 MED ORDER — FENTANYL CITRATE (PF) 100 MCG/2ML IJ SOLN
INTRAMUSCULAR | Status: DC | PRN
  Administered 2019-01-01: 25 ug via INTRAVENOUS

## 2019-01-01 MED ORDER — FENTANYL CITRATE (PF) 100 MCG/2ML IJ SOLN
INTRAMUSCULAR | Status: AC
  Filled 2019-01-01: qty 2

## 2019-01-01 MED ORDER — HEPARIN SODIUM (PORCINE) 1000 UNIT/ML IJ SOLN
INTRAMUSCULAR | Status: AC
  Filled 2019-01-01: qty 1

## 2019-01-01 MED ORDER — SODIUM CHLORIDE 0.9 % WEIGHT BASED INFUSION
1.0000 mL/kg/h | INTRAVENOUS | Status: AC
  Administered 2019-01-01: 1 mL/kg/h via INTRAVENOUS

## 2019-01-01 MED ORDER — HEPARIN (PORCINE) 25000 UT/250ML-% IV SOLN
800.0000 [IU]/h | INTRAVENOUS | Status: DC
  Administered 2019-01-01: 900 [IU]/h via INTRAVENOUS
  Administered 2019-01-02: 1100 [IU]/h via INTRAVENOUS
  Administered 2019-01-03: 950 [IU]/h via INTRAVENOUS
  Administered 2019-01-05: 800 [IU]/h via INTRAVENOUS
  Filled 2019-01-01 (×4): qty 250

## 2019-01-01 MED ORDER — POTASSIUM CHLORIDE CRYS ER 20 MEQ PO TBCR
40.0000 meq | EXTENDED_RELEASE_TABLET | Freq: Once | ORAL | Status: AC
  Administered 2019-01-01: 40 meq via ORAL
  Filled 2019-01-01: qty 2

## 2019-01-01 MED ORDER — SODIUM CHLORIDE 0.9% FLUSH: 3.0000 mL | INTRAVENOUS | Status: DC | PRN

## 2019-01-01 MED ORDER — IOHEXOL 350 MG/ML SOLN
INTRAVENOUS | Status: DC | PRN
  Administered 2019-01-01: 90 mL via INTRACARDIAC

## 2019-01-01 MED ORDER — MIDAZOLAM HCL 2 MG/2ML IJ SOLN
INTRAMUSCULAR | Status: AC
  Filled 2019-01-01: qty 2

## 2019-01-01 MED ORDER — HEPARIN SODIUM (PORCINE) 1000 UNIT/ML IJ SOLN
INTRAMUSCULAR | Status: DC | PRN
  Administered 2019-01-01: 4500 [IU] via INTRAVENOUS

## 2019-01-01 MED ORDER — HEPARIN (PORCINE) IN NACL 1000-0.9 UT/500ML-% IV SOLN
INTRAVENOUS | Status: DC | PRN
  Administered 2019-01-01 (×2): 500 mL

## 2019-01-01 MED ORDER — VERAPAMIL HCL 2.5 MG/ML IV SOLN
INTRAVENOUS | Status: DC | PRN
  Administered 2019-01-01: 09:00:00 via INTRA_ARTERIAL

## 2019-01-01 MED ORDER — ASPIRIN 81 MG PO CHEW
81.0000 mg | CHEWABLE_TABLET | Freq: Every day | ORAL | Status: DC
  Administered 2019-01-02 – 2019-01-04 (×3): 81 mg via ORAL
  Filled 2019-01-01 (×3): qty 1

## 2019-01-01 MED ORDER — ONDANSETRON HCL 4 MG/2ML IJ SOLN: 4.0000 mg | Freq: Four times a day (QID) | INTRAMUSCULAR | Status: DC | PRN

## 2019-01-01 SURGICAL SUPPLY — 12 items
CATH 5FR JL3.5 JR4 ANG PIG MP (CATHETERS) ×2 IMPLANT
CATH INFINITI 5 FR AR1 MOD (CATHETERS) ×2 IMPLANT
CATH INFINITI 5FR AL1 (CATHETERS) ×2 IMPLANT
DEVICE RAD COMP TR BAND LRG (VASCULAR PRODUCTS) ×2 IMPLANT
GLIDESHEATH SLEND SS 6F .021 (SHEATH) ×2 IMPLANT
GUIDEWIRE INQWIRE 1.5J.035X260 (WIRE) ×1 IMPLANT
INQWIRE 1.5J .035X260CM (WIRE) ×2
KIT HEART LEFT (KITS) ×2 IMPLANT
PACK CARDIAC CATHETERIZATION (CUSTOM PROCEDURE TRAY) ×2 IMPLANT
SHEATH PROBE COVER 6X72 (BAG) ×2 IMPLANT
TRANSDUCER W/STOPCOCK (MISCELLANEOUS) ×2 IMPLANT
TUBING CIL FLEX 10 FLL-RA (TUBING) ×2 IMPLANT

## 2019-01-01 NOTE — Consult Note (Addendum)
[address]       [place] [address]             [phone]        Baylee Y Dorer Avery Medical Record #[id number] Date of Birth: [birth year]  Referring: Dr. Irish Lack Primary Care: Hoyt Koch, MD Primary Cardiologist:New to Dr. Gwenlyn Found  Chief Complaint:    Chief Complaint  Patient presents with  . Chest tightness, shortness of breath with exertion, fatigue  Reason for consultation: Coronary artery disease  History of Present Illness:     This is a 68 year old African American female with a past medical history of hyperlipidemia, pre diabetes (HGA1C 6), GERD, and right breast cancer (s/p radiation and right lumpectomy) who began experiencing exertional increasing shortness of breath and fatigue about one month ago. More recently, last week, she began experiencing chest tightness with activities right arm pain. She denied nausea, diaphoresis, or syncope. Of note, she previously was diagnosed with hypertension and was put on medication (for about one month), but she stopped taking it as no longer needed it (high blood pressure resolved and medication caused dizziness).  She then presented to her primary care medical doctor yesterday morning. EKG done there showed sinus bradycardia. Her symptoms were concerning for unstable angina so she was sent to [facility] ED for further evaluation. Troponin I's have been less than 0.03. Echo done 03/11 showed LVEF 60-65% and no significant valvular disease. Cardiac catheterization done by Dr. Irish Lack earlier today showed severe three vessel disease with LVEF 55-65%. Dr. Cyndia Bent has been consulted regarding consideration for coronary artery bypass grafting surgery. Currently, the patient is no distress and denies chest pain or shortness of breath.   Current Activity/ Functional Status: Patient is independent with mobility/ambulation, transfers, ADL's, IADL's.   Zubrod Score: At the time of surgery this patient's most  appropriate activity status/level should be described as: []     0    Normal activity, no symptoms [x]     1    Restricted in physical strenuous activity but ambulatory, able to do out light work []     2    Ambulatory and capable of self care, unable to do work activities, up and about more than 50%  Of the time                            []     3    Only limited self care, in bed greater than 50% of waking hours []     4    Completely disabled, no self care, confined to bed or chair []     5    Moribund  Past Medical History:  Diagnosis Date  . Allergy   . Breast cancer ([place]) 2000  . Cancer [facility])    breast  . Colon polyp   . GERD (gastroesophageal reflux disease)   . Hyperlipidemia   . Migraine   . Personal history of radiation therapy 2000    Past Surgical History:  Procedure Laterality Date  . ABDOMINAL HYSTERECTOMY    . BREAST LUMPECTOMY Right 2000  . BREAST SURGERY    . EYE SURGERY    . hemorrhoid      Social History   Tobacco Use  Smoking Status Never Smoker  Smokeless Tobacco Never Used    Social History   Substance and Sexual Activity  Alcohol Use No  . Frequency: Never  She  is a Theme park manager at a [employer]   Allergies  Allergen Reactions  . Codeine   . Sulfa Antibiotics   . Sulfacetamide     Other reaction(s): Other (See Comments) swelling    Current Facility-Administered Medications  Medication Dose Route Frequency Provider Last Rate Last Dose  . 0.9 %  sodium chloride infusion   Intravenous Continuous Almyra Deforest, [place]   Stopped at 01/01/19 0123  . 0.9 %  sodium chloride infusion  250 mL Intravenous PRN Jettie Booze, MD      . 0.9% sodium chloride infusion  1 mL/kg/hr Intravenous Continuous Jettie Booze, MD 83.6 mL/hr at 01/01/19 1033 1 mL/kg/hr at 01/01/19 1033  . acetaminophen (TYLENOL) tablet 650 mg  650 mg Oral Q4H PRN Almyra Deforest, [place]      . Derrill Memo ON 01/02/2019] aspirin chewable tablet 81 mg  81 mg Oral Daily Jettie Booze, MD      .  atorvastatin (LIPITOR) tablet 80 mg  80 mg Oral q1800 Almyra Deforest, [place]      . heparin ADULT infusion 100 units/mL (25000 units/210mL sodium chloride 0.45%)  900 Units/hr Intravenous Continuous Nimer, Ignatius Specking, RPH      . nitroGLYCERIN (NITROSTAT) SL tablet 0.4 mg  0.4 mg Sublingual Q5 Min x 3 PRN Almyra Deforest, PA      . ondansetron (ZOFRAN) injection 4 mg  4 mg Intravenous Q6H PRN Almyra Deforest, PA      . sodium chloride flush (NS) 0.9 % injection 3 mL  3 mL Intravenous Q12H Larae Grooms S, MD      . sodium chloride flush (NS) 0.9 % injection 3 mL  3 mL Intravenous PRN Jettie Booze, MD      . zolpidem (AMBIEN) tablet 5 mg  5 mg Oral QHS PRN Jettie Booze, MD        Medications Prior to Admission  Medication Sig Dispense Refill Last Dose  . Digestive Enzymes (DIGESTIVE ENZYME PO) Take 1 capsule by mouth 2 (two) times daily.    12/30/2018 at Unknown time  . loratadine (CLARITIN) 10 MG tablet Take 10 mg by mouth daily.    12/30/2018 at Unknown time  . Multiple Vitamin (MULTI VITAMIN DAILY PO) Take 1 tablet by mouth daily.    12/30/2018 at Unknown time  . simvastatin (ZOCOR) 20 MG tablet Take 1 tablet (20 mg total) by mouth at bedtime. 90 tablet 3 12/30/2018 at Unknown time  . Vitamin D, Cholecalciferol, 1000 units CAPS Take 1,000 Units by mouth 2 (two) times daily.    12/30/2018 at Unknown time    Family History  Problem Relation Age of Onset  . Heart disease Brother Died in early 34's  . Asthma Paternal Aunt   . Diabetes Paternal Aunt   . Stroke Paternal Uncle   . Kidney disease Paternal Uncle    Review of Systems:     Cardiac Review of Systems: Y or  [  N  ]= no  Chest Pain [ Yes-prior to admission   ] Resting SOB [ N  ] Exertional SOB  [Y  ]  Orthopnea Aqua.Slicker  ]   Pedal Edema [ N  ]    Syncope  Aqua.Slicker  ]   Presyncope [  N ]  General Review of Systems: [Y] = yes [N  ]=no Constitional:  fatigue [ Y ]; nausea [ N ]; night sweats [ N ]; fever [  N]; or chills [ N ]  Dental:Has braces   Eye :  diplopia [ N  ];   Amaurosis fugax[N  ]; Resp: cough [ N ];  wheezing[  N];  hemoptysis[N  ]; shortness of breath[  ];  GI:  vomiting[ N ];  dysphagia[ N ]; melena[N  ];  hematochezia Aqua.Slicker  ];  GU:  hematuria[N  ];   dysuria Aqua.Slicker  ];              Skin: rash, swelling[ N ];,  Musculosketetal: back pain[ N ];  Heme/Lymph:  anemia[N  ];  Neuro: TIA[ N ];    stroke[N  ];  vertigo[N  ];  seizures[N  ];    Psych:depression[  ]; anxiety[  ];  Endocrine: pre diabetes[ N ];  thyroid dysfunction[N  ];                  Physical Exam: BP (!) 150/67 (BP Location: Right Arm)   Pulse (!) 54   Temp 98.3 F (36.8 C) (Oral)   Resp 12   Ht 5\' 6"  (1.676 m)   Wt 83.6 kg   SpO2 97%   BMI 29.75 kg/m    General appearance: alert, cooperative and no distress Head: Normocephalic, without obvious abnormality, atraumatic Neck: no carotid bruit and no JVD Resp: clear to auscultation bilaterally Cardio: RRR, no murmur GI: Soft, non tender, bowel sounds present Extremities: No LE edema. Palpable DP/PT bilaterally Neurologic: Grossly normal  Diagnostic Studies & Laboratory data:   ECHOCARDIOGRAM REPORT       Patient Name:   Jessica [name] Date of Exam: 12/31/2018 Medical Rec #:  [id number]       Height:       66.0 in Accession #:    [id number]      Weight:       188.9 lb Date of Birth:  [birth year]       BSA:          1.95 m Patient Age:    23 years        BP:           146/95 mmHg Patient Gender: F               HR:           54 bpm. Exam Location:  Inpatient    Procedure: 2D Echo  Indications:    Chest Pain 786.50 / R07.9   History:        Patient has no prior history of Echocardiogram examinations.   Sonographer:    Clayton Lefort RDCS (AE) Referring Phys: [phone] HAO MENG  IMPRESSIONS    1. The left ventricle has normal systolic function with an ejection fraction of 60-65%. The cavity size was normal. There is mild  asymmetric left ventricular hypertrophy. Left ventricular diastolic parameters were normal. No evidence of left ventricular  regional wall motion abnormalities.  2. The right ventricle has normal systolic function. The cavity was normal. There is no increase in right ventricular wall thickness.  3. The aortic valve is tricuspid Aortic valve regurgitation is mild by color flow Doppler.  4. The inferior vena cava was normal in size with <50% respiratory variability.  FINDINGS  Left Ventricle: The left ventricle has normal systolic function, with an ejection fraction of 60-65%. The cavity size was normal. There is mild asymmetric left ventricular hypertrophy. Left ventricular diastolic parameters were normal. No evidence of  left ventricular regional wall motion abnormalities.. Right Ventricle: The right ventricle has  normal systolic function. The cavity was normal. There is no increase in right ventricular wall thickness. Left Atrium: left atrial size was normal in size Right Atrium: right atrial size was normal in size. Right atrial pressure is estimated at 8 mmHg. Interatrial Septum: No atrial level shunt detected by color flow Doppler. Pericardium: There is no evidence of pericardial effusion. Mitral Valve: The mitral valve is normal in structure. Mitral valve regurgitation is trivial by color flow Doppler. Tricuspid Valve: The tricuspid valve is normal in structure. Tricuspid valve regurgitation is mild by color flow Doppler. Aortic Valve: The aortic valve is tricuspid Aortic valve regurgitation is mild by color flow Doppler. Pulmonic Valve: The pulmonic valve was grossly normal. Pulmonic valve regurgitation is trivial by color flow Doppler. Pulmonary Artery: The pulmonary artery is not well seen. Venous: The inferior vena cava is normal in size with less than 50% respiratory variability.   LEFT VENTRICLE PLAX 2D LVIDd:         4.10 cm  Diastology LVIDs:         2.70 cm  LV e' lateral:    5.33 cm/s LV PW:         1.20 cm  LV E/e' lateral: 12.2 LV IVS:        1.40 cm  LV e' medial:    6.74 cm/s LVOT diam:     2.10 cm  LV E/e' medial:  9.6 LV SV:         47 ml LV SV Index:   23.32 LVOT Area:     3.46 cm  RIGHT VENTRICLE RV S prime:     14.70 cm/s TAPSE (M-mode): 2.7 cm RVSP:           20.5 mmHg  LEFT ATRIUM             Index       RIGHT ATRIUM           Index LA diam:        2.90 cm 1.49 cm/m  RA Pressure: 8 mmHg LA Vol (A2C):   38.7 ml 19.83 ml/m RA Area:     17.80 cm LA Vol (A4C):   39.3 ml 20.13 ml/m RA Volume:   47.40 ml  24.28 ml/m LA Biplane Vol: 39.1 ml 20.03 ml/m  AORTIC VALVE AV Area (Vmax):    2.83 cm AV Area (Vmean):   2.68 cm AV Area (VTI):     2.99 cm AV Vmax:           126.00 cm/s AV Vmean:          86.167 cm/s AV VTI:            0.275 m AV Peak Grad:      6.4 mmHg AV Mean Grad:      3.3 mmHg LVOT Vmax:         103.00 cm/s LVOT Vmean:        66.633 cm/s LVOT VTI:          0.237 m LVOT/AV VTI ratio: 0.86   AORTA Ao Root diam: 3.30 cm  MV E velocity: 65.00 cm/s TRICUSPID VALVE MV A velocity: 84.40 cm/s TR Peak grad:   12.5 mmHg MV E/A ratio:  0.77       TR Vmax:        178.00 cm/s                           RVSP:  20.5 mmHg                             SHUNTS                           Systemic VTI:  0.24 m                           Systemic Diam: 2.10 cm    Buford Dresser MD Electronically signed by Buford Dresser MD Signature Date/Time: 12/31/2018/8:45:39 PM    LEFT HEART CATH AND CORONARY ANGIOGRAPHY by Dr. Irish Lack on 01/01/2019:  Conclusion     Mid LAD lesion is 75% stenosed.  Prox LAD lesion is 40% stenosed.  Ost 1st Diag lesion is 95% stenosed.  Prox Cx lesion is 95% stenosed.  Dist RCA lesion is 80% stenosed.  The left ventricular systolic function is normal.  LV end diastolic pressure is normal.  The left ventricular ejection fraction is 55-65% by visual estimate.  There is no  aortic valve stenosis.   Multivessel CAD including severe disease at the mid LAD, proximal diagonal, proximal circumflex and RCA at the bifurcation of the PDA and PLA.  Plan for surgery consult.      Resting               Left Heart   Left Ventricle The left ventricular size is normal. The left ventricular systolic function is normal. LV end diastolic pressure is normal. The left ventricular ejection fraction is 55-65% by visual estimate. No regional wall motion abnormalities.  Aortic Valve There is no aortic valve stenosis.  Coronary Diagrams   Diagnostic  Dominance: Right     Recent Radiology Findings:   Dg Chest 2 View  Result Date: 12/31/2018 CLINICAL DATA:  Chest pain over the last week with abnormal EKG. EXAM: CHEST - 2 VIEW COMPARISON:  None. FINDINGS: The heart is enlarged with left ventricular prominence. Mild tortuosity of the aorta. The lungs are clear. The pulmonary vascularity is normal. No effusions. No significant bone finding. IMPRESSION: No active disease.  Left ventricular prominence.  Tortuous aorta. Electronically Signed   By: Nelson Chimes M.D.   On: 12/31/2018 10:11     I have independently reviewed the above radiologic studies and discussed with the patient   Recent Lab Findings: Lab Results  Component Value Date   WBC 5.9 12/31/2018   HGB 12.0 12/31/2018   HCT 39.1 12/31/2018   PLT 256 12/31/2018   GLUCOSE 89 01/01/2019   CHOL 223 (H) 01/01/2019   TRIG 122 01/01/2019   HDL 33 (L) 01/01/2019   LDLCALC 166 (H) 01/01/2019   ALT 33 09/17/2018   AST 33 09/17/2018   NA 139 01/01/2019   K 3.4 (L) 01/01/2019   CL 106 01/01/2019   CREATININE 0.93 01/01/2019   BUN 9 01/01/2019   CO2 22 01/01/2019   TSH 1.32 05/16/2018   HGBA1C 6.0 09/17/2018    Assessment / Plan:   1. Coronary artery disease-on Heparin drip. She would benefit from coronary artery bypass grafting surgery. Dr. Cyndia Bent to evaluate and determine timing of surgery 2. History of  hyperlipidemia-on Atorvastatin. She states Lipitor gave her myalgias previously;may need to change medication  3. History of hypertension-cardiology following. 4. History of pre diabetes-HGA1C 6 (three months ago). Will provide nutrition recommendations with discharge paperwork and will need to follow up  with medical doctor after discharge  I  spent 25 minutes counseling the patient face to face.   Lars Pinks PA-C 01/01/2019 11:48 AM

## 2019-01-01 NOTE — Progress Notes (Signed)
TCTS consulted for CABG evaluation. °

## 2019-01-01 NOTE — Progress Notes (Signed)
Pt received from cath lab. RR site level 0. 13cc air. VSS. Pt educated on arm restrictions. Will continue to monitor.  Clyde Canterbury, RN

## 2019-01-01 NOTE — Progress Notes (Addendum)
ANTICOAGULATION CONSULT NOTE - Initial Consult  Pharmacy Consult for heparin  Indication: ACS/STEMI  Allergies  Allergen Reactions  . Codeine   . Sulfa Antibiotics   . Sulfacetamide     Other reaction(s): Other (See Comments) swelling    Patient Measurements: Height: 5\' 6"  (167.6 cm) Weight: 184 lb 4.8 oz (83.6 kg) IBW/kg (Calculated) : 59.3 Heparin Dosing Weight: 77 kg  Vital Signs: Temp: 98 F (36.7 C) (03/12 0803) Temp Source: Oral (03/12 0803) BP: 138/76 (03/12 0932) Pulse Rate: 0 (03/12 0942)  Labs: Recent Labs    12/31/18 0918 12/31/18 1612 12/31/18 2207 01/01/19 0444  HGB 12.0  --   --   --   HCT 39.1  --   --   --   PLT 256  --   --   --   CREATININE 1.04*  --   --  0.93  TROPONINI  --  <0.03 <0.03 <0.03    Estimated Creatinine Clearance: 63.1 mL/min (by C-G formula based on SCr of 0.93 mg/dL).   Medical History: Past Medical History:  Diagnosis Date  . Allergy   . Breast cancer ([place]) 2000  . Cancer [facility])    breast  . Colon polyp   . GERD (gastroesophageal reflux disease)   . Hyperlipidemia   . Migraine   . Personal history of radiation therapy 2000    Assessment: Patient is a 68 yo female that presented with SOB and chest pain starting about 2 weeks ago. PMH significant for HLD, prediabetes, and R breast CA s/p radiation. Patient went to cath on 3/12 which revealed multivessel CAD. Pharmacy consulted to start heparin 8 hours post sheath removal. CBCs currently stable, plts 256.    Goal of Therapy:  Heparin level 0.3-0.7 units/ml Monitor platelets by anticoagulation protocol: Yes   Plan:  Start heparin 8 hours post sheath removal at 1730 Start heparin infusion at 900 units/hr Check anti-Xa level in 6 hours and daily while on heparin Continue to monitor H&H and platelets  Gwenlyn Found, Sherian Rein D PGY1 Pharmacy Resident  Phone [phone] 01/01/2019   11:05 AM

## 2019-01-02 ENCOUNTER — Observation Stay (HOSPITAL_COMMUNITY): Payer: Medicare Other

## 2019-01-02 DIAGNOSIS — I200 Unstable angina: Secondary | ICD-10-CM | POA: Diagnosis not present

## 2019-01-02 LAB — CBC
HCT: 37.1 % (ref 36.0–46.0)
HEMOGLOBIN: 11.9 g/dL — AB (ref 12.0–15.0)
MCH: 28.8 pg (ref 26.0–34.0)
MCHC: 32.1 g/dL (ref 30.0–36.0)
MCV: 89.8 fL (ref 80.0–100.0)
Platelets: 231 10*3/uL (ref 150–400)
RBC: 4.13 MIL/uL (ref 3.87–5.11)
RDW: 12.7 % (ref 11.5–15.5)
WBC: 6.9 10*3/uL (ref 4.0–10.5)
nRBC: 0 % (ref 0.0–0.2)

## 2019-01-02 LAB — PULMONARY FUNCTION TEST
FEF 25-75 Pre: 2.34 L/sec
FEF2575-%PRED-PRE: 122 %
FEV1-%Pred-Pre: 104 %
FEV1-Pre: 2.16 L
FEV1FVC-%Pred-Pre: 107 %
FEV6-%Pred-Pre: 101 %
FEV6-PRE: 2.59 L
FEV6FVC-%Pred-Pre: 103 %
FVC-%Pred-Pre: 97 %
FVC-Pre: 2.59 L
PRE FEV6/FVC RATIO: 100 %
Pre FEV1/FVC ratio: 83 %

## 2019-01-02 LAB — HEPARIN LEVEL (UNFRACTIONATED)
Heparin Unfractionated: 0.16 IU/mL — ABNORMAL LOW (ref 0.30–0.70)
Heparin Unfractionated: 0.45 IU/mL (ref 0.30–0.70)

## 2019-01-02 NOTE — Progress Notes (Signed)
CARDIAC REHAB PHASE I   Pre-op education completed with pt and family. Pt given Cardiac Surgery booklet, [facility] Care Guide, and in-the-tube sheet. Pts questions answered. Will continue to follow throughout her hospital stay.  [id number] Rufina Falco, RN BSN 01/02/2019 3:03 PM

## 2019-01-02 NOTE — Progress Notes (Signed)
[place] for heparin  Indication: ACS/STEMI  Allergies  Allergen Reactions  . Codeine   . Sulfa Antibiotics   . Sulfacetamide     Other reaction(s): Other (See Comments) swelling    Patient Measurements: Height: 5\' 6"  (167.6 cm) Weight: 184 lb 4.8 oz (83.6 kg) IBW/kg (Calculated) : 59.3 Heparin Dosing Weight: 77 kg  Vital Signs: Temp: 98.2 F (36.8 C) (03/12 2140) Temp Source: Oral (03/12 2140) BP: 140/77 (03/12 2140) Pulse Rate: 57 (03/12 2140)  Labs: Recent Labs    12/31/18 0918 12/31/18 1612 12/31/18 2207 01/01/19 0444 01/01/19 2323  HGB 12.0  --   --   --   --   HCT 39.1  --   --   --   --   PLT 256  --   --   --   --   HEPARINUNFRC  --   --   --   --  0.16*  CREATININE 1.04*  --   --  0.93  --   TROPONINI  --  <0.03 <0.03 <0.03  --     Estimated Creatinine Clearance: 63.1 mL/min (by C-G formula based on SCr of 0.93 mg/dL).  Assessment: 68 yo female with CAD, awaiting possible CABG, for heparin.    Goal of Therapy:  Heparin level 0.3-0.7 units/ml Monitor platelets by anticoagulation protocol: Yes   Plan:  Increase Heparin 1100 units/hr Follow-up am labs.   Phillis Knack, PharmD, BCPS  01/02/2019   12:26 AM

## 2019-01-02 NOTE — Progress Notes (Signed)
ANTICOAGULATION CONSULT NOTE - [facility] for heparin  Indication: ACS/STEMI  Allergies  Allergen Reactions  . Codeine   . Sulfa Antibiotics   . Sulfacetamide     Other reaction(s): Other (See Comments) swelling    Patient Measurements: Height: 5\' 6"  (167.6 cm) Weight: 184 lb 4.8 oz (83.6 kg) IBW/kg (Calculated) : 59.3 Heparin Dosing Weight: 77 kg  Vital Signs: Temp: 98.2 F (36.8 C) (03/13 0813) Temp Source: Oral (03/13 0813) BP: 132/76 (03/13 0813) Pulse Rate: 58 (03/13 0813)  Labs: Recent Labs    12/31/18 0918 12/31/18 1612 12/31/18 2207 01/01/19 0444 01/01/19 2323 01/02/19 0806  HGB 12.0  --   --   --   --  11.9*  HCT 39.1  --   --   --   --  37.1  PLT 256  --   --   --   --  231  HEPARINUNFRC  --   --   --   --  0.16* 0.45  CREATININE 1.04*  --   --  0.93  --   --   TROPONINI  --  <0.03 <0.03 <0.03  --   --     Estimated Creatinine Clearance: 63.1 mL/min (by C-G formula based on SCr of 0.93 mg/dL).   Medical History: Past Medical History:  Diagnosis Date  . Allergy   . Breast cancer ([place]) 2000  . Cancer [facility])    breast  . Colon polyp   . GERD (gastroesophageal reflux disease)   . Hyperlipidemia   . Migraine   . Personal history of radiation therapy 2000    Assessment: 68 yo female that presented with SOB and chest pain starting about 2 weeks ago. Pt s/p cath positive for multivessel CAD. Pharmacy consulted to manage heparin infusion while awaiting TCTS consult for CABG. Heparin level remains therapeutic at 0.45 this morning, CBC is stable.   Goal of Therapy:  Heparin level 0.3-0.7 units/ml Monitor platelets by anticoagulation protocol: Yes   Plan:  -Continue heparin 1100 units/hr -Daily heparin level and CBC  Arrie Senate, PharmD, BCPS Clinical Pharmacist [phone] Please check AMION for all [place] numbers 01/02/2019

## 2019-01-02 NOTE — Progress Notes (Signed)
Progress Note  Patient Name: Jessica [name] Date of Encounter: 01/02/2019  Primary Cardiologist: Dr. Quay Burow  Subjective   Postop day #1 cardiac catheterization performed via the right radial approach in the setting of accelerated angina revealing three-vessel disease and preserved LV function.  She is a candidate for CABG and has been seen by TCT  Inpatient Medications    Scheduled Meds: . aspirin  81 mg Oral Daily  . atorvastatin  80 mg Oral q1800  . sodium chloride flush  3 mL Intravenous Q12H   Continuous Infusions: . sodium chloride Stopped (01/01/19 0123)  . sodium chloride    . heparin 1,100 Units/hr (01/02/19 0300)   PRN Meds: sodium chloride, acetaminophen, nitroGLYCERIN, ondansetron (ZOFRAN) IV, sodium chloride flush, zolpidem   Vital Signs    Vitals:   01/01/19 1032 01/01/19 2140 01/02/19 0600 01/02/19 0813  BP: (!) 150/67 140/77 137/61 132/76  Pulse: (!) 54 (!) 57 (!) 56 (!) 58  Resp: 12 15 17 20   Temp:  98.2 F (36.8 C) 98 F (36.7 C) 98.2 F (36.8 C)  TempSrc:  Oral Oral Oral  SpO2: 97% 100% 96% 100%  Weight:      Height:        Intake/Output Summary (Last 24 hours) at 01/02/2019 0954 Last data filed at 01/02/2019 0300 Gross per 24 hour  Intake 801.45 ml  Output -  Net 801.45 ml   Last 3 Weights 12/31/2018 12/31/2018 12/31/2018  Weight (lbs) 184 lb 4.8 oz 188 lb 15 oz 189 lb  Weight (kg) 83.598 kg 85.7 kg 85.73 kg      Telemetry    Sinus rhythm with an episode of PSVT her- Personally Reviewed  ECG    Not performed today not performed today on visit- Personally Reviewed  Physical Exam   GEN: No acute distress.   Neck: No JVD Cardiac: RRR, no murmurs, rubs, or gallops.  Respiratory: Clear to auscultation bilaterally. GI: Soft, nontender, non-distended  MS: No edema; No deformity. Neuro:  Nonfocal  Psych: Normal affect   Labs    Chemistry Recent Labs  Lab 12/31/18 0918 01/01/19 0444  NA 140 139  K 3.7 3.4*  CL 103 106   CO2 27 22  GLUCOSE 94 89  BUN 10 9  CREATININE 1.04* 0.93  CALCIUM 9.5 8.6*  GFRNONAA 55* >60  GFRAA >60 >60  ANIONGAP 10 11     Hematology Recent Labs  Lab 12/31/18 0918 01/02/19 0806  WBC 5.9 6.9  RBC 4.26 4.13  HGB 12.0 11.9*  HCT 39.1 37.1  MCV 91.8 89.8  MCH 28.2 28.8  MCHC 30.7 32.1  RDW 12.8 12.7  PLT 256 231    Cardiac Enzymes Recent Labs  Lab 12/31/18 1612 12/31/18 2207 01/01/19 0444  TROPONINI <0.03 <0.03 <0.03    Recent Labs  Lab 12/31/18 0916  TROPIPOC 0.01     BNPNo results for input(s): BNP, PROBNP in the last 168 hours.   DDimer No results for input(s): DDIMER in the last 168 hours.   Radiology    Vas US Doppler Pre Cabg  Result Date: 01/01/2019 PREOPERATIVE VASCULAR EVALUATION  Indications: Pre CABG. Performing Technologist: June Leap RDMS, RVT  Examination Guidelines: A complete evaluation includes B-mode imaging, spectral Doppler, color Doppler, and power Doppler as needed of all accessible portions of each vessel. Bilateral testing is considered an integral part of a complete examination. Limited examinations for reoccurring indications may be performed as noted.  Right Carotid Findings: +----------+--------+--------+--------+-----------+------------------+  PSV cm/sEDV cm/sStenosisDescribe   Comments           +----------+--------+--------+--------+-----------+------------------+ CCA Prox  79      25                         intimal thickening +----------+--------+--------+--------+-----------+------------------+ CCA Distal88      20                         intimal thickening +----------+--------+--------+--------+-----------+------------------+ ICA Prox  58      15      1-39%   homogeneous                   +----------+--------+--------+--------+-----------+------------------+ ICA Distal88      29                                             +----------+--------+--------+--------+-----------+------------------+ ECA       87      19                                            +----------+--------+--------+--------+-----------+------------------+ Portions of this table do not appear on this page. +----------+--------+-------+----------------+------------+           PSV cm/sEDV cmsDescribe        Arm Pressure +----------+--------+-------+----------------+------------+ Subclavian124            Multiphasic, WNL             +----------+--------+-------+----------------+------------+ +---------+--------+--+--------+--+---------+ VertebralPSV cm/s81EDV cm/s25Antegrade +---------+--------+--+--------+--+---------+ Left Carotid Findings: +----------+--------+--------+--------+-----------+------------------+           PSV cm/sEDV cm/sStenosisDescribe   Comments           +----------+--------+--------+--------+-----------+------------------+ CCA Prox  138     32                         intimal thickening +----------+--------+--------+--------+-----------+------------------+ CCA Distal105     39                                            +----------+--------+--------+--------+-----------+------------------+ ICA Prox  112     37      1-39%   homogeneous                   +----------+--------+--------+--------+-----------+------------------+ ICA Distal155     48                                            +----------+--------+--------+--------+-----------+------------------+ ECA       109     21                                            +----------+--------+--------+--------+-----------+------------------+ +----------+--------+--------+----------------+------------+ SubclavianPSV cm/sEDV cm/sDescribe        Arm Pressure +----------+--------+--------+----------------+------------+           133  Multiphasic, ZOX096           +----------+--------+--------+----------------+------------+ +---------+--------+---+--------+--+---------+ VertebralPSV cm/s110EDV cm/s37Antegrade +---------+--------+---+--------+--+---------+  ABI Findings: +--------+------------------+-----+---------+---------------------------+ Right   Rt Pressure (mmHg)IndexWaveform Comment                     +--------+------------------+-----+---------+---------------------------+ Brachial                       triphasicBP not taken due to TR band +--------+------------------+-----+---------+---------------------------+ +--------+------------------+-----+---------+-------+ Left    Lt Pressure (mmHg)IndexWaveform Comment +--------+------------------+-----+---------+-------+ Brachial140                    triphasic        +--------+------------------+-----+---------+-------+  Right Doppler Findings: +--------+--------+-----+---------+---------------------------+ Site    PressureIndexDoppler  Comments                    +--------+--------+-----+---------+---------------------------+ Brachial             triphasicBP not taken due to TR band +--------+--------+-----+---------+---------------------------+ Radial               triphasic                            +--------+--------+-----+---------+---------------------------+ Ulnar                triphasic                            +--------+--------+-----+---------+---------------------------+  Left Doppler Findings: +--------+--------+-----+---------+--------+ Site    PressureIndexDoppler  Comments +--------+--------+-----+---------+--------+ [id number]          triphasic         +--------+--------+-----+---------+--------+ Radial               triphasic         +--------+--------+-----+---------+--------+ Ulnar                triphasic         +--------+--------+-----+---------+--------+  Summary: Right Carotid: Velocities in the right ICA are  consistent with a 1-39% stenosis. Left Carotid: Velocities in the left ICA are consistent with a 1-39% stenosis. ABI: Pedal artery waveforms within normal limits. Right Upper Extremity: Not obtained due to TR band. Left Upper Extremity: Doppler waveforms decrease >50% with left radial compression. Doppler waveform obliterate with left ulnar compression.  Electronically signed by Servando Snare MD on 01/01/2019 at 2:41:20 PM.    Final     Cardiac Studies   2D echo (12/31/2018)  IMPRESSIONS    1. The left ventricle has normal systolic function with an ejection fraction of 60-65%. The cavity size was normal. There is mild asymmetric left ventricular hypertrophy. Left ventricular diastolic parameters were normal. No evidence of left ventricular  regional wall motion abnormalities.  2. The right ventricle has normal systolic function. The cavity was normal. There is no increase in right ventricular wall thickness.  3. The aortic valve is tricuspid Aortic valve regurgitation is mild by color flow Doppler.  4. The inferior vena cava was normal in size with <50% respiratory variability.   Cardiac catheterization (01/01/2019)  Conclusion     Mid LAD lesion is 75% stenosed.  Prox LAD lesion is 40% stenosed.  Ost 1st Diag lesion is 95% stenosed.  Prox Cx lesion is 95% stenosed.  Dist RCA lesion is 80% stenosed.  The left ventricular systolic function is normal.  LV end diastolic pressure is normal.  The left ventricular  ejection fraction is 55-65% by visual estimate.  There is no aortic valve stenosis.   Multivessel CAD including severe disease at the mid LAD, proximal diagonal, proximal circumflex and RCA at the bifurcation of the PDA and PLA.     Patient Profile     Jessica [name] is a 68 y.o. female with PMH of HLD, prediabetes and R breast CA s/p radiation and R lumpectomy presented with worsening exertional chest pain.  A 2D echo was normal.  Cardiac catheterization revealed  three-vessel disease.  Dr. Irish Lack thought that this was "surgical anatomy" and she has been seen by the cardiothoracic surgeons.  Assessment & Plan    1: Coronary artery disease- accelerated angina with three-vessel disease, anatomy suitable for CABG.  She remains on IV heparin.  She is pain-free.  2: Essential hypertension- currently not on medications.  She was on beta-blocker but has significant bradycardia  3: Hyperlipidemia- high-dose statin therapy started  We will keep in the hospital on IV heparin until she is revascularized.  For questions or updates, please contact [place] Please consult [email or web address] for contact info under        Signed, Quay Burow, MD  01/02/2019, 9:54 AM

## 2019-01-03 DIAGNOSIS — D62 Acute posthemorrhagic anemia: Secondary | ICD-10-CM | POA: Diagnosis not present

## 2019-01-03 DIAGNOSIS — Z853 Personal history of malignant neoplasm of breast: Secondary | ICD-10-CM | POA: Diagnosis not present

## 2019-01-03 DIAGNOSIS — I10 Essential (primary) hypertension: Secondary | ICD-10-CM

## 2019-01-03 DIAGNOSIS — Z90710 Acquired absence of both cervix and uterus: Secondary | ICD-10-CM | POA: Diagnosis not present

## 2019-01-03 DIAGNOSIS — Z8249 Family history of ischemic heart disease and other diseases of the circulatory system: Secondary | ICD-10-CM | POA: Diagnosis not present

## 2019-01-03 DIAGNOSIS — E785 Hyperlipidemia, unspecified: Secondary | ICD-10-CM

## 2019-01-03 DIAGNOSIS — Z86010 Personal history of colonic polyps: Secondary | ICD-10-CM | POA: Diagnosis not present

## 2019-01-03 DIAGNOSIS — Z882 Allergy status to sulfonamides status: Secondary | ICD-10-CM | POA: Diagnosis not present

## 2019-01-03 DIAGNOSIS — I351 Nonrheumatic aortic (valve) insufficiency: Secondary | ICD-10-CM | POA: Diagnosis present

## 2019-01-03 DIAGNOSIS — I25110 Atherosclerotic heart disease of native coronary artery with unstable angina pectoris: Secondary | ICD-10-CM | POA: Diagnosis present

## 2019-01-03 DIAGNOSIS — D696 Thrombocytopenia, unspecified: Secondary | ICD-10-CM | POA: Diagnosis not present

## 2019-01-03 DIAGNOSIS — Z79899 Other long term (current) drug therapy: Secondary | ICD-10-CM | POA: Diagnosis not present

## 2019-01-03 DIAGNOSIS — Z923 Personal history of irradiation: Secondary | ICD-10-CM | POA: Diagnosis not present

## 2019-01-03 DIAGNOSIS — Z885 Allergy status to narcotic agent status: Secondary | ICD-10-CM | POA: Diagnosis not present

## 2019-01-03 DIAGNOSIS — I2510 Atherosclerotic heart disease of native coronary artery without angina pectoris: Secondary | ICD-10-CM | POA: Diagnosis not present

## 2019-01-03 DIAGNOSIS — R001 Bradycardia, unspecified: Secondary | ICD-10-CM | POA: Diagnosis present

## 2019-01-03 DIAGNOSIS — I200 Unstable angina: Secondary | ICD-10-CM | POA: Diagnosis present

## 2019-01-03 DIAGNOSIS — Z825 Family history of asthma and other chronic lower respiratory diseases: Secondary | ICD-10-CM | POA: Diagnosis not present

## 2019-01-03 DIAGNOSIS — K219 Gastro-esophageal reflux disease without esophagitis: Secondary | ICD-10-CM | POA: Diagnosis present

## 2019-01-03 DIAGNOSIS — E8770 Fluid overload, unspecified: Secondary | ICD-10-CM | POA: Diagnosis not present

## 2019-01-03 DIAGNOSIS — E119 Type 2 diabetes mellitus without complications: Secondary | ICD-10-CM | POA: Diagnosis present

## 2019-01-03 DIAGNOSIS — Z833 Family history of diabetes mellitus: Secondary | ICD-10-CM | POA: Diagnosis not present

## 2019-01-03 DIAGNOSIS — Z823 Family history of stroke: Secondary | ICD-10-CM | POA: Diagnosis not present

## 2019-01-03 LAB — URINALYSIS, ROUTINE W REFLEX MICROSCOPIC
Bacteria, UA: NONE SEEN
Bilirubin Urine: NEGATIVE
Glucose, UA: NEGATIVE mg/dL
Hgb urine dipstick: NEGATIVE
Ketones, ur: NEGATIVE mg/dL
Nitrite: NEGATIVE
Protein, ur: NEGATIVE mg/dL
SPECIFIC GRAVITY, URINE: 1.008 (ref 1.005–1.030)
pH: 6 (ref 5.0–8.0)

## 2019-01-03 LAB — CBC
HCT: 34.7 % — ABNORMAL LOW (ref 36.0–46.0)
Hemoglobin: 11.2 g/dL — ABNORMAL LOW (ref 12.0–15.0)
MCH: 28.9 pg (ref 26.0–34.0)
MCHC: 32.3 g/dL (ref 30.0–36.0)
MCV: 89.4 fL (ref 80.0–100.0)
PLATELETS: 245 10*3/uL (ref 150–400)
RBC: 3.88 MIL/uL (ref 3.87–5.11)
RDW: 12.8 % (ref 11.5–15.5)
WBC: 7.4 10*3/uL (ref 4.0–10.5)
nRBC: 0 % (ref 0.0–0.2)

## 2019-01-03 LAB — SURGICAL PCR SCREEN
MRSA, PCR: NEGATIVE
Staphylococcus aureus: POSITIVE — AB

## 2019-01-03 LAB — HEPARIN LEVEL (UNFRACTIONATED)
Heparin Unfractionated: 0.84 IU/mL — ABNORMAL HIGH (ref 0.30–0.70)
Heparin Unfractionated: 0.79 IU/mL — ABNORMAL HIGH (ref 0.30–0.70)

## 2019-01-03 MED ORDER — MAGNESIUM SULFATE 50 % IJ SOLN
40.0000 meq | INTRAMUSCULAR | Status: DC
  Filled 2019-01-03: qty 9.85

## 2019-01-03 MED ORDER — MILRINONE LACTATE IN DEXTROSE 20-5 MG/100ML-% IV SOLN
0.3000 ug/kg/min | INTRAVENOUS | Status: DC
  Filled 2019-01-03: qty 100

## 2019-01-03 MED ORDER — SODIUM CHLORIDE 0.9 % IV SOLN
INTRAVENOUS | Status: DC
  Filled 2019-01-03: qty 30

## 2019-01-03 MED ORDER — TRANEXAMIC ACID 1000 MG/10ML IV SOLN
1.5000 mg/kg/h | INTRAVENOUS | Status: AC
  Administered 2019-01-05: 1.5 mg/kg/h via INTRAVENOUS
  Filled 2019-01-03: qty 25

## 2019-01-03 MED ORDER — TRANEXAMIC ACID (OHS) BOLUS VIA INFUSION
15.0000 mg/kg | INTRAVENOUS | Status: AC
  Administered 2019-01-05: 1254 mg via INTRAVENOUS
  Filled 2019-01-03: qty 1254

## 2019-01-03 MED ORDER — SODIUM CHLORIDE 0.9 % IV SOLN
1.5000 g | INTRAVENOUS | Status: AC
  Administered 2019-01-05: 1.5 g via INTRAVENOUS
  Filled 2019-01-03: qty 1.5

## 2019-01-03 MED ORDER — INSULIN REGULAR(HUMAN) IN NACL 100-0.9 UT/100ML-% IV SOLN
INTRAVENOUS | Status: AC
  Administered 2019-01-05: .8 [IU]/h via INTRAVENOUS
  Filled 2019-01-03: qty 100

## 2019-01-03 MED ORDER — MUPIROCIN 2 % EX OINT
1.0000 "application " | TOPICAL_OINTMENT | Freq: Two times a day (BID) | CUTANEOUS | Status: AC
  Administered 2019-01-03 – 2019-01-08 (×9): 1 via NASAL
  Filled 2019-01-03 (×5): qty 22

## 2019-01-03 MED ORDER — SODIUM CHLORIDE 0.9 % IV SOLN
750.0000 mg | INTRAVENOUS | Status: AC
  Administered 2019-01-05: 750 mg via INTRAVENOUS
  Filled 2019-01-03: qty 750

## 2019-01-03 MED ORDER — CHLORHEXIDINE GLUCONATE CLOTH 2 % EX PADS
6.0000 | MEDICATED_PAD | Freq: Every day | CUTANEOUS | Status: DC
  Administered 2019-01-03 – 2019-01-04 (×2): 6 via TOPICAL

## 2019-01-03 MED ORDER — NITROGLYCERIN IN D5W 200-5 MCG/ML-% IV SOLN
2.0000 ug/min | INTRAVENOUS | Status: DC
  Filled 2019-01-03: qty 250

## 2019-01-03 MED ORDER — DEXMEDETOMIDINE HCL IN NACL 400 MCG/100ML IV SOLN
0.1000 ug/kg/h | INTRAVENOUS | Status: AC
  Administered 2019-01-05: .5 ug/kg/h via INTRAVENOUS
  Filled 2019-01-03: qty 100

## 2019-01-03 MED ORDER — DOPAMINE-DEXTROSE 3.2-5 MG/ML-% IV SOLN
0.0000 ug/kg/min | INTRAVENOUS | Status: DC
  Filled 2019-01-03: qty 250

## 2019-01-03 MED ORDER — VANCOMYCIN HCL 10 G IV SOLR
1500.0000 mg | INTRAVENOUS | Status: AC
  Administered 2019-01-05: 1500 mg via INTRAVENOUS
  Filled 2019-01-03: qty 1500

## 2019-01-03 MED ORDER — TRANEXAMIC ACID (OHS) PUMP PRIME SOLUTION
2.0000 mg/kg | INTRAVENOUS | Status: DC
  Filled 2019-01-03: qty 1.67

## 2019-01-03 MED ORDER — NOREPINEPHRINE 4 MG/250ML-% IV SOLN
0.0000 ug/min | INTRAVENOUS | Status: DC
  Filled 2019-01-03: qty 250

## 2019-01-03 MED ORDER — PHENYLEPHRINE HCL-NACL 20-0.9 MG/250ML-% IV SOLN
30.0000 ug/min | INTRAVENOUS | Status: AC
  Administered 2019-01-05: 20 ug/min via INTRAVENOUS
  Filled 2019-01-03: qty 250

## 2019-01-03 MED ORDER — PLASMA-LYTE 148 IV SOLN
INTRAVENOUS | Status: DC
  Filled 2019-01-03: qty 2.5

## 2019-01-03 MED ORDER — EPINEPHRINE PF 1 MG/ML IJ SOLN
0.0000 ug/min | INTRAVENOUS | Status: DC
  Filled 2019-01-03: qty 4

## 2019-01-03 MED ORDER — POLYETHYLENE GLYCOL 3350 17 G PO PACK
17.0000 g | PACK | Freq: Every day | ORAL | Status: DC | PRN
  Administered 2019-01-03: 17 g via ORAL
  Filled 2019-01-03: qty 1

## 2019-01-03 MED ORDER — POTASSIUM CHLORIDE 2 MEQ/ML IV SOLN
80.0000 meq | INTRAVENOUS | Status: DC
  Filled 2019-01-03: qty 40

## 2019-01-03 NOTE — Progress Notes (Signed)
Patient requesting medication for BM, miralax given as ordered as needed for constipation.Jessica [name], Bettina Gavia RN

## 2019-01-03 NOTE — Progress Notes (Signed)
Patient requesting medication for BM, NP on call paged through [facility] system will monitor patient. Demya Scruggs, Bettina Gavia RN

## 2019-01-03 NOTE — Progress Notes (Signed)
ANTICOAGULATION CONSULT NOTE - [place] for heparin  Indication: CAD, CABG pending  Allergies  Allergen Reactions  . Codeine   . Sulfa Antibiotics   . Sulfacetamide     Other reaction(s): Other (See Comments) swelling    Patient Measurements: Height: 5\' 6"  (167.6 cm) Weight: 184 lb 4.8 oz (83.6 kg) IBW/kg (Calculated) : 59.3 Heparin Dosing Weight: 77 kg  Vital Signs: Temp: 98.1 F (36.7 C) (03/14 0436) Temp Source: Oral (03/14 0436) BP: 126/79 (03/14 0853) Pulse Rate: 65 (03/14 0436)  Labs: Recent Labs    12/31/18 0918 12/31/18 1612 12/31/18 2207 01/01/19 0444 01/01/19 2323 01/02/19 0806 01/03/19 0312  HGB 12.0  --   --   --   --  11.9* 11.2*  HCT 39.1  --   --   --   --  37.1 34.7*  PLT 256  --   --   --   --  231 245  HEPARINUNFRC  --   --   --   --  0.16* 0.45 0.84*  CREATININE 1.04*  --   --  0.93  --   --   --   TROPONINI  --  <0.03 <0.03 <0.03  --   --   --     Estimated Creatinine Clearance: 63.1 mL/min (by C-G formula based on SCr of 0.93 mg/dL).   Medical History: Past Medical History:  Diagnosis Date  . Allergy   . Breast cancer ([place]) 2000  . Cancer [facility])    breast  . Colon polyp   . GERD (gastroesophageal reflux disease)   . Hyperlipidemia   . Migraine   . Personal history of radiation therapy 2000    Assessment: 68 yo female that presented with SOB and chest pain starting about 2 weeks ago. Pt s/p cath positive for multivessel CAD. Pharmacy consulted to manage heparin infusion while awaiting TCTS consult for CABG.   Heparin level is above goal on 1100 units/hr.  No bleeding noted.  CBC stable.  Goal of Therapy:  Heparin level 0.3-0.7 units/ml Monitor platelets by anticoagulation protocol: Yes   Plan:  Decrease heparin to 950 units/hr Next heparin level in 6 hours Daily heparin level and CBC Noted plans for CABG 3/16  [facility], Pharm.D., BCPS Clinical Pharmacist Clinical phone for 01/03/2019  from 7:30-3:00 is [phone].  **Pharmacist phone directory can now be found on [email or web address] ([email or web address]).  Listed under [place].  01/03/2019 9:10 AM

## 2019-01-03 NOTE — Progress Notes (Signed)
CARDIAC REHAB PHASE I   PRE:  Rate/Rhythm: 70 SR  BP:  Sitting: 121/81      SaO2: 98% RA  MODE:  Ambulation: 940 ft   POST:  Rate/Rhythm: 80 SR  BP:  Sitting: 116/88      SaO2: 98% RA   Pt ambulated 940 ft with steady gait. Very independent. Pt denied any complaints of CP, SOB or dizziness. Pt returned to recliner. Call bell within reach. Cardiac Rehab will continue to follow.   10:25-1052  Carma Lair MS, ACSM CEP  10:47 AM 01/03/2019

## 2019-01-03 NOTE — Progress Notes (Signed)
Patient ambulated in hallway with spouse. Monserrate Blaschke, Bettina Gavia RN

## 2019-01-03 NOTE — Progress Notes (Signed)
ANTICOAGULATION CONSULT NOTE - [place] for heparin  Indication: CAD, CABG pending  Allergies  Allergen Reactions  . Codeine   . Sulfa Antibiotics   . Sulfacetamide     Other reaction(s): Other (See Comments) swelling    Patient Measurements: Height: 5\' 6"  (167.6 cm) Weight: 184 lb 4.8 oz (83.6 kg) IBW/kg (Calculated) : 59.3 Heparin Dosing Weight: 77 kg  Vital Signs: Temp: 98.4 F (36.9 C) (03/14 1344) Temp Source: Oral (03/14 1344) BP: 116/88 (03/14 1043) Pulse Rate: 64 (03/14 1344)  Labs: Recent Labs    12/31/18 2207 01/01/19 0444  01/02/19 0806 01/03/19 0312 01/03/19 1633  HGB  --   --   --  11.9* 11.2*  --   HCT  --   --   --  37.1 34.7*  --   PLT  --   --   --  231 245  --   HEPARINUNFRC  --   --    < > 0.45 0.84* 0.79*  CREATININE  --  0.93  --   --   --   --   TROPONINI <0.03 <0.03  --   --   --   --    < > = values in this interval not displayed.    Estimated Creatinine Clearance: 63.1 mL/min (by C-G formula based on SCr of 0.93 mg/dL).   Medical History: Past Medical History:  Diagnosis Date  . Allergy   . Breast cancer ([place]) 2000  . Cancer [facility])    breast  . Colon polyp   . GERD (gastroesophageal reflux disease)   . Hyperlipidemia   . Migraine   . Personal history of radiation therapy 2000    Assessment: 68 yo female that presented with SOB and chest pain starting about 2 weeks ago. Pt s/p cath positive for multivessel CAD. Pharmacy consulted to manage heparin infusion while awaiting TCTS consult for CABG.   Heparin level is above goal on 950 units / hr  Goal of Therapy:  Heparin level 0.3-0.7 units/ml Monitor platelets by anticoagulation protocol: Yes   Plan:  Decrease heparin to 800 units/hr Daily heparin level and CBC Noted plans for CABG 3/16  Thank you Anette Guarneri, PharmD [phone]   **Pharmacist phone directory can now be found on [email or web address] ([email or web address]).  Listed under [place].  01/03/2019  5:48 PM

## 2019-01-03 NOTE — Progress Notes (Deleted)
ANTICOAGULATION CONSULT NOTE - [place] for heparin  Indication: CAD, CABG pending  Allergies  Allergen Reactions  . Codeine   . Sulfa Antibiotics   . Sulfacetamide     Other reaction(s): Other (See Comments) swelling    Patient Measurements: Height: 5\' 6"  (167.6 cm) Weight: 184 lb 4.8 oz (83.6 kg) IBW/kg (Calculated) : 59.3 Heparin Dosing Weight: 77 kg  Vital Signs: Temp: 98.4 F (36.9 C) (03/14 1344) Temp Source: Oral (03/14 1344) BP: 116/88 (03/14 1043) Pulse Rate: 64 (03/14 1344)  Labs: Recent Labs    12/31/18 2207 01/01/19 0444  01/02/19 0806 01/03/19 0312 01/03/19 1633  HGB  --   --   --  11.9* 11.2*  --   HCT  --   --   --  37.1 34.7*  --   PLT  --   --   --  231 245  --   HEPARINUNFRC  --   --    < > 0.45 0.84* 0.79*  CREATININE  --  0.93  --   --   --   --   TROPONINI <0.03 <0.03  --   --   --   --    < > = values in this interval not displayed.    Estimated Creatinine Clearance: 63.1 mL/min (by C-G formula based on SCr of 0.93 mg/dL).   Medical History: Past Medical History:  Diagnosis Date  . Allergy   . Breast cancer ([place]) 2000  . Cancer [facility])    breast  . Colon polyp   . GERD (gastroesophageal reflux disease)   . Hyperlipidemia   . Migraine   . Personal history of radiation therapy 2000    Assessment: 68 yo female that presented with SOB and chest pain starting about 2 weeks ago. Pt s/p cath positive for multivessel CAD. Pharmacy consulted to manage heparin infusion while awaiting CABG on 3/16. -Heparin level= 0.79 on 950 units/hr   Goal of Therapy:  Heparin level 0.3-0.7 units/ml Monitor platelets by anticoagulation protocol: Yes   Plan:  Decrease heparin to 750 units/hr Daily heparin level and CBC  Hildred Laser, PharmD Clinical Pharmacist **Pharmacist phone directory can now be found on [email or web address] ([email or web address]).  Listed under [place].

## 2019-01-03 NOTE — Progress Notes (Signed)
Progress Note  Patient Name: Jessica [name] Date of Encounter: 01/03/2019  Primary Cardiologist: Dr. Quay Burow  Subjective   The patient denies any symptoms of chest pain, palpitations, shortness of breath, lightheadedness, dizziness, leg swelling, orthopnea, PND, and syncope.   Inpatient Medications    Scheduled Meds: . aspirin  81 mg Oral Daily  . atorvastatin  80 mg Oral q1800  . [START ON 01/05/2019] heparin-papaverine-plasmalyte irrigation   Irrigation To OR  . [START ON 01/05/2019] insulin   Intravenous To OR  . [START ON 01/05/2019] magnesium sulfate  40 mEq Other To OR  . [START ON 01/05/2019] phenylephrine  30-200 mcg/min Intravenous To OR  . [START ON 01/05/2019] potassium chloride  80 mEq Other To OR  . sodium chloride flush  3 mL Intravenous Q12H  . [START ON 01/05/2019] tranexamic acid  15 mg/kg Intravenous To OR  . [START ON 01/05/2019] tranexamic acid  2 mg/kg Intracatheter To OR   Continuous Infusions: . sodium chloride Stopped (01/01/19 0123)  . sodium chloride    . [START ON 01/05/2019] cefUROXime (ZINACEF)  IV    . [START ON 01/05/2019] cefUROXime (ZINACEF)  IV    . [START ON 01/05/2019] dexmedetomidine    . [START ON 01/05/2019] DOPamine    . [START ON 01/05/2019] epinephrine    . [START ON 01/05/2019] heparin 30,000 units/NS 1000 mL solution for CELLSAVER    . heparin 950 Units/hr (01/03/19 1055)  . [START ON 01/05/2019] milrinone    . [START ON 01/05/2019] nitroGLYCERIN    . [START ON 01/05/2019] norepinephrine (LEVOPHED) Adult infusion    . [START ON 01/05/2019] tranexamic acid (CYKLOKAPRON) infusion ([facility])    . [START ON 01/05/2019] vancomycin     PRN Meds: sodium chloride, acetaminophen, nitroGLYCERIN, ondansetron (ZOFRAN) IV, sodium chloride flush, zolpidem   Vital Signs    Vitals:   01/03/19 0436 01/03/19 0853 01/03/19 1028 01/03/19 1043  BP: (!) 147/75 126/79 121/81 116/88  Pulse: 65     Resp: 20 15 18 16   Temp: 98.1 F (36.7 C)     TempSrc:  Oral     SpO2: 98% 98%    Weight:      Height:        Intake/Output Summary (Last 24 hours) at 01/03/2019 1155 Last data filed at 01/03/2019 1055 Gross per 24 hour  Intake 341.84 ml  Output -  Net 341.84 ml   Filed Weights   12/31/18 0858 12/31/18 1700  Weight: 85.7 kg 83.6 kg    Telemetry    Sinus rhythm/sinus bradycardia - Personally Reviewed  ECG    NA - Personally Reviewed  Physical Exam   GEN: No acute distress.   Neck: No JVD Cardiac: RRR, soft systolic murmur over RUSB, no rubs or gallops.  Respiratory: Clear to auscultation bilaterally. GI: Soft, nontender, non-distended  MS: No edema; No deformity. Neuro:  Nonfocal  Psych: Normal affect   Labs    Chemistry Recent Labs  Lab 12/31/18 0918 01/01/19 0444  NA 140 139  K 3.7 3.4*  CL 103 106  CO2 27 22  GLUCOSE 94 89  BUN 10 9  CREATININE 1.04* 0.93  CALCIUM 9.5 8.6*  GFRNONAA 55* >60  GFRAA >60 >60  ANIONGAP 10 11     Hematology Recent Labs  Lab 12/31/18 0918 01/02/19 0806 01/03/19 0312  WBC 5.9 6.9 7.4  RBC 4.26 4.13 3.88  HGB 12.0 11.9* 11.2*  HCT 39.1 37.1 34.7*  MCV 91.8 89.8 89.4  MCH  28.2 28.8 28.9  MCHC 30.7 32.1 32.3  RDW 12.8 12.7 12.8  PLT 256 231 245    Cardiac Enzymes Recent Labs  Lab 12/31/18 1612 12/31/18 2207 01/01/19 0444  TROPONINI <0.03 <0.03 <0.03    Recent Labs  Lab 12/31/18 0916  TROPIPOC 0.01     BNPNo results for input(s): BNP, PROBNP in the last 168 hours.   DDimer No results for input(s): DDIMER in the last 168 hours.   Radiology    Vas US Doppler Pre Cabg  Result Date: 01/01/2019 PREOPERATIVE VASCULAR EVALUATION  Indications: Pre CABG. Performing Technologist: June Leap RDMS, RVT  Examination Guidelines: A complete evaluation includes B-mode imaging, spectral Doppler, color Doppler, and power Doppler as needed of all accessible portions of each vessel. Bilateral testing is considered an integral part of a complete examination. Limited  examinations for reoccurring indications may be performed as noted.  Right Carotid Findings: +----------+--------+--------+--------+-----------+------------------+           PSV cm/sEDV cm/sStenosisDescribe   Comments           +----------+--------+--------+--------+-----------+------------------+ CCA Prox  79      25                         intimal thickening +----------+--------+--------+--------+-----------+------------------+ CCA Distal88      20                         intimal thickening +----------+--------+--------+--------+-----------+------------------+ ICA Prox  58      15      1-39%   homogeneous                   +----------+--------+--------+--------+-----------+------------------+ ICA Distal88      29                                            +----------+--------+--------+--------+-----------+------------------+ ECA       87      19                                            +----------+--------+--------+--------+-----------+------------------+ Portions of this table do not appear on this page. +----------+--------+-------+----------------+------------+           PSV cm/sEDV cmsDescribe        Arm Pressure +----------+--------+-------+----------------+------------+ Subclavian124            Multiphasic, WNL             +----------+--------+-------+----------------+------------+ +---------+--------+--+--------+--+---------+ VertebralPSV cm/s81EDV cm/s25Antegrade +---------+--------+--+--------+--+---------+ Left Carotid Findings: +----------+--------+--------+--------+-----------+------------------+           PSV cm/sEDV cm/sStenosisDescribe   Comments           +----------+--------+--------+--------+-----------+------------------+ CCA Prox  138     32                         intimal thickening +----------+--------+--------+--------+-----------+------------------+ CCA Distal105     39                                             +----------+--------+--------+--------+-----------+------------------+ ICA Prox  112     37  1-39%   homogeneous                   +----------+--------+--------+--------+-----------+------------------+ ICA Distal155     48                                            +----------+--------+--------+--------+-----------+------------------+ ECA       109     21                                            +----------+--------+--------+--------+-----------+------------------+ +----------+--------+--------+----------------+------------+ SubclavianPSV cm/sEDV cm/sDescribe        Arm Pressure +----------+--------+--------+----------------+------------+           133             Multiphasic, WNL140          +----------+--------+--------+----------------+------------+ +---------+--------+---+--------+--+---------+ VertebralPSV cm/s110EDV cm/s37Antegrade +---------+--------+---+--------+--+---------+  ABI Findings: +--------+------------------+-----+---------+---------------------------+ Right   Rt Pressure (mmHg)IndexWaveform Comment                     +--------+------------------+-----+---------+---------------------------+ Brachial                       triphasicBP not taken due to TR band +--------+------------------+-----+---------+---------------------------+ +--------+------------------+-----+---------+-------+ Left    Lt Pressure (mmHg)IndexWaveform Comment +--------+------------------+-----+---------+-------+ Brachial140                    triphasic        +--------+------------------+-----+---------+-------+  Right Doppler Findings: +--------+--------+-----+---------+---------------------------+ Site    PressureIndexDoppler  Comments                    +--------+--------+-----+---------+---------------------------+ Brachial             triphasicBP not taken due to TR band +--------+--------+-----+---------+---------------------------+  Radial               triphasic                            +--------+--------+-----+---------+---------------------------+ Ulnar                triphasic                            +--------+--------+-----+---------+---------------------------+  Left Doppler Findings: +--------+--------+-----+---------+--------+ Site    PressureIndexDoppler  Comments +--------+--------+-----+---------+--------+ OINOMVEH209          triphasic         +--------+--------+-----+---------+--------+ Radial               triphasic         +--------+--------+-----+---------+--------+ Ulnar                triphasic         +--------+--------+-----+---------+--------+  Summary: Right Carotid: Velocities in the right ICA are consistent with a 1-39% stenosis. Left Carotid: Velocities in the left ICA are consistent with a 1-39% stenosis. ABI: Pedal artery waveforms within normal limits. Right Upper Extremity: Not obtained due to TR band. Left Upper Extremity: Doppler waveforms decrease >50% with left radial compression. Doppler waveform obliterate with left ulnar compression.  Electronically signed by Servando Snare MD on 01/01/2019 at 2:41:20 PM.    Final     Cardiac Studies  2D echo (12/31/2018)  IMPRESSIONS   1. The left ventricle has normal systolic function with an ejection fraction of 60-65%. The cavity size was normal. There is mild asymmetric left ventricular hypertrophy. Left ventricular diastolic parameters were normal. No evidence of left ventricular regional wall motion abnormalities. 2. The right ventricle has normal systolic function. The cavity was normal. There is no increase in right ventricular wall thickness. 3. The aortic valve is tricuspid Aortic valve regurgitation is mild by color flow Doppler. 4. The inferior vena cava was normal in size with <50% respiratory variability.   Cardiac catheterization (01/01/2019)  Conclusion     Mid LAD lesion is 75%  stenosed.  Prox LAD lesion is 40% stenosed.  Ost 1st Diag lesion is 95% stenosed.  Prox Cx lesion is 95% stenosed.  Dist RCA lesion is 80% stenosed.  The left ventricular systolic function is normal.  LV end diastolic pressure is normal.  The left ventricular ejection fraction is 55-65% by visual estimate.  There is no aortic valve stenosis.  Multivessel CAD including severe disease at the mid LAD, proximal diagonal, proximal circumflex and RCA at the bifurcation of the PDA and PLA.     Patient Profile     68 y.o. female with PMH of HLD, prediabetes and R breast CA s/p radiation and R lumpectomy presented with worsening exertional chest pain.  A 2D echo was normal.  Cardiac catheterization revealed three-vessel disease.  Dr. Irish Lack thought that this was "surgical anatomy" and she has been seen by the cardiothoracic surgeons.  Assessment & Plan    1. CAD: Awaiting CABG. Asymptomatic. Continue ASA, atorvastatin, and IV heparin.  2. HTN: BP is normal. No changes.  3. Hyperlipidemia: LDL 166. Now on high intensity statin therapy with atorvastatin 80 mg.  For questions or updates, please contact [place] Please consult [email or web address] for contact info under Cardiology/STEMI.      Signed, Kate Sable, MD  01/03/2019, 11:55 AM

## 2019-01-04 DIAGNOSIS — R001 Bradycardia, unspecified: Secondary | ICD-10-CM

## 2019-01-04 LAB — APTT: APTT: 66 s — AB (ref 24–36)

## 2019-01-04 LAB — ABO/RH: ABO/RH(D): B POS

## 2019-01-04 LAB — CBC
HCT: 36.5 % (ref 36.0–46.0)
Hemoglobin: 11.7 g/dL — ABNORMAL LOW (ref 12.0–15.0)
MCH: 29 pg (ref 26.0–34.0)
MCHC: 32.1 g/dL (ref 30.0–36.0)
MCV: 90.3 fL (ref 80.0–100.0)
Platelets: 226 10*3/uL (ref 150–400)
RBC: 4.04 MIL/uL (ref 3.87–5.11)
RDW: 12.9 % (ref 11.5–15.5)
WBC: 7.6 10*3/uL (ref 4.0–10.5)
nRBC: 0 % (ref 0.0–0.2)

## 2019-01-04 LAB — HEMOGLOBIN A1C
Hgb A1c MFr Bld: 5.8 % — ABNORMAL HIGH (ref 4.8–5.6)
Mean Plasma Glucose: 119.76 mg/dL

## 2019-01-04 LAB — PROTIME-INR
INR: 1.1 (ref 0.8–1.2)
Prothrombin Time: 13.7 seconds (ref 11.4–15.2)

## 2019-01-04 LAB — HEPARIN LEVEL (UNFRACTIONATED): Heparin Unfractionated: 0.34 IU/mL (ref 0.30–0.70)

## 2019-01-04 MED ORDER — CHLORHEXIDINE GLUCONATE 0.12 % MT SOLN
15.0000 mL | Freq: Once | OROMUCOSAL | Status: AC
  Administered 2019-01-05: 15 mL via OROMUCOSAL
  Filled 2019-01-04: qty 15

## 2019-01-04 MED ORDER — BISACODYL 5 MG PO TBEC
5.0000 mg | DELAYED_RELEASE_TABLET | Freq: Once | ORAL | Status: AC
  Administered 2019-01-04: 5 mg via ORAL
  Filled 2019-01-04: qty 1

## 2019-01-04 MED ORDER — CHLORHEXIDINE GLUCONATE CLOTH 2 % EX PADS
6.0000 | MEDICATED_PAD | Freq: Once | CUTANEOUS | Status: AC
  Administered 2019-01-04: 6 via TOPICAL

## 2019-01-04 MED ORDER — CHLORHEXIDINE GLUCONATE CLOTH 2 % EX PADS
6.0000 | MEDICATED_PAD | Freq: Once | CUTANEOUS | Status: AC
  Administered 2019-01-05: 6 via TOPICAL

## 2019-01-04 MED ORDER — METOPROLOL TARTRATE 12.5 MG HALF TABLET
12.5000 mg | ORAL_TABLET | Freq: Once | ORAL | Status: AC
  Administered 2019-01-05: 12.5 mg via ORAL
  Filled 2019-01-04: qty 1

## 2019-01-04 MED ORDER — SENNOSIDES-DOCUSATE SODIUM 8.6-50 MG PO TABS: 1.0000 | ORAL_TABLET | Freq: Two times a day (BID) | ORAL | Status: DC | PRN

## 2019-01-04 NOTE — Progress Notes (Signed)
ANTICOAGULATION CONSULT NOTE - [facility] for heparin  Indication: CAD, CABG pending  Allergies  Allergen Reactions  . Codeine   . Sulfa Antibiotics   . Sulfacetamide     Other reaction(s): Other (See Comments) swelling    Patient Measurements: Height: 5\' 6"  (167.6 cm) Weight: 184 lb 4.8 oz (83.6 kg) IBW/kg (Calculated) : 59.3 Heparin Dosing Weight: 77 kg  Vital Signs: Temp: 98.1 F (36.7 C) (03/15 0535) Temp Source: Oral (03/15 0535) BP: 147/83 (03/15 0842) Pulse Rate: 56 (03/15 0842)  Labs: Recent Labs    01/02/19 0806 01/03/19 0312 01/03/19 1633 01/04/19 0332  HGB 11.9* 11.2*  --  11.7*  HCT 37.1 34.7*  --  36.5  PLT 231 245  --  226  HEPARINUNFRC 0.45 0.84* 0.79* 0.34    Estimated Creatinine Clearance: 63.1 mL/min (by C-G formula based on SCr of 0.93 mg/dL).   Medical History: Past Medical History:  Diagnosis Date  . Allergy   . Breast cancer ([place]) 2000  . Cancer [facility])    breast  . Colon polyp   . GERD (gastroesophageal reflux disease)   . Hyperlipidemia   . Migraine   . Personal history of radiation therapy 2000    Assessment: 68 yo female that presented with SOB and chest pain starting about 2 weeks ago. Pt s/p cath positive for multivessel CAD. Pharmacy consulted to manage heparin infusion while awaiting CABG on 3/16. -Heparin level at goal.  CBC stable.  Goal of Therapy:  Heparin level 0.3-0.7 units/ml Monitor platelets by anticoagulation protocol: Yes   Plan:  -No heparin changes needed -Daily heparin level and CBC  Hildred Laser, PharmD Clinical Pharmacist **Pharmacist phone directory can now be found on [email or web address] ([email or web address]).  Listed under [place].

## 2019-01-04 NOTE — Progress Notes (Signed)
Patient states "she doesn't have as much energy this AM as she did yesterday". Pt vital signs, heart rate upper 40s-low 50s on monitor at this time and BP 147/83. Pt up to sink to wash up will monitor patient. Eldean Nanna, Bettina Gavia rN

## 2019-01-04 NOTE — Progress Notes (Signed)
Patient ambulated in hallway independently will monitor patient. Meylin Stenzel Jessup RN  

## 2019-01-04 NOTE — Progress Notes (Signed)
ABG canceled per Dr. Roxan Hockey due to pt being stuck x's 3.

## 2019-01-04 NOTE — Progress Notes (Signed)
Patient ambulated in hallway this AM will monitor patient. Jessica [name] rN  

## 2019-01-04 NOTE — Anesthesia Preprocedure Evaluation (Addendum)
Anesthesia Evaluation  Patient identified by MRN, date of birth, ID band Patient awake    Reviewed: Allergy & Precautions, H&P , NPO status , Patient's Chart, lab work & pertinent test results  Airway Mallampati: II  TM Distance: >3 FB Neck ROM: Full    Dental no notable dental hx. (+) Teeth Intact, Dental Advisory Given   Pulmonary neg pulmonary ROS,    Pulmonary exam normal breath sounds clear to auscultation       Cardiovascular Exercise Tolerance: Good + angina + CAD   Rhythm:Regular Rate:Normal     Neuro/Psych  Headaches, negative psych ROS   GI/Hepatic Neg liver ROS, GERD  ,  Endo/Other  negative endocrine ROS  Renal/GU negative Renal ROS  negative genitourinary   Musculoskeletal   Abdominal   Peds  Hematology negative hematology ROS (+)   Anesthesia Other Findings   Reproductive/Obstetrics negative OB ROS                            Anesthesia Physical Anesthesia Plan  ASA: IV  Anesthesia Plan: General   Post-op Pain Management:    Induction: Intravenous  PONV Risk Score and Plan: 3 and Midazolam and Treatment may vary due to age or medical condition  Airway Management Planned: Oral ETT  Additional Equipment: Arterial line, CVP, PA Cath, TEE and Ultrasound Guidance Line Placement  Intra-op Plan:   Post-operative Plan: Post-operative intubation/ventilation  Informed Consent: I have reviewed the patients History and Physical, chart, labs and discussed the procedure including the risks, benefits and alternatives for the proposed anesthesia with the patient or authorized representative who has indicated his/her understanding and acceptance.     Dental advisory given  Plan Discussed with: CRNA  Anesthesia Plan Comments:         Anesthesia Quick Evaluation

## 2019-01-04 NOTE — Progress Notes (Signed)
Progress Note  Patient Name: Jessica [name] Date of Encounter: 01/04/2019  Primary Cardiologist: No primary care provider on file.   Subjective   Denies chest pain and shortness of breath. Felt a little sluggish earlier this morning (HR 44 bpm).  Inpatient Medications    Scheduled Meds: . aspirin  81 mg Oral Daily  . atorvastatin  80 mg Oral q1800  . bisacodyl  5 mg Oral Once  . Chlorhexidine Gluconate Cloth  6 each Topical Daily  . [START ON 01/05/2019] heparin-papaverine-plasmalyte irrigation   Irrigation To OR  . [START ON 01/05/2019] insulin   Intravenous To OR  . [START ON 01/05/2019] magnesium sulfate  40 mEq Other To OR  . mupirocin ointment  1 application Nasal BID  . [START ON 01/05/2019] phenylephrine  30-200 mcg/min Intravenous To OR  . [START ON 01/05/2019] potassium chloride  80 mEq Other To OR  . sodium chloride flush  3 mL Intravenous Q12H  . [START ON 01/05/2019] tranexamic acid  15 mg/kg Intravenous To OR  . [START ON 01/05/2019] tranexamic acid  2 mg/kg Intracatheter To OR   Continuous Infusions: . sodium chloride Stopped (01/01/19 0123)  . sodium chloride    . [START ON 01/05/2019] cefUROXime (ZINACEF)  IV    . [START ON 01/05/2019] cefUROXime (ZINACEF)  IV    . [START ON 01/05/2019] dexmedetomidine    . [START ON 01/05/2019] DOPamine    . [START ON 01/05/2019] epinephrine    . [START ON 01/05/2019] heparin 30,000 units/NS 1000 mL solution for CELLSAVER    . heparin 800 Units/hr (01/04/19 1100)  . [START ON 01/05/2019] milrinone    . [START ON 01/05/2019] nitroGLYCERIN    . [START ON 01/05/2019] norepinephrine (LEVOPHED) Adult infusion    . [START ON 01/05/2019] tranexamic acid (CYKLOKAPRON) infusion ([facility])    . [START ON 01/05/2019] vancomycin     PRN Meds: sodium chloride, acetaminophen, nitroGLYCERIN, ondansetron (ZOFRAN) IV, polyethylene glycol, senna-docusate, sodium chloride flush, zolpidem   Vital Signs    Vitals:   01/03/19 1924 01/04/19 0535 01/04/19  0842 01/04/19 1155  BP: 126/66 137/72 (!) 147/83 120/74  Pulse: 63 (!) 51 (!) 56 71  Resp: 19 18 14 12   Temp: 98.4 F (36.9 C) 98.1 F (36.7 C)  98.7 F (37.1 C)  TempSrc: Oral Oral  Oral  SpO2: 96% 98% 96% 98%  Weight:      Height:        Intake/Output Summary (Last 24 hours) at 01/04/2019 1428 Last data filed at 01/04/2019 1100 Gross per 24 hour  Intake 705.53 ml  Output 3 ml  Net 702.53 ml   Filed Weights   12/31/18 0858 12/31/18 1700  Weight: 85.7 kg 83.6 kg    Telemetry    Sinus rhythm and sinus bradycardia (low 40 bpm range earlier), currently in low 60 bpm range - Personally Reviewed  ECG    NA - Personally Reviewed  Physical Exam   GEN: No acute distress.   Neck: No JVD Cardiac: RRR, no murmurs, rubs, or gallops.  Respiratory: Clear to auscultation bilaterally. GI: Soft, nontender, non-distended  MS: No edema; No deformity. Neuro:  Nonfocal  Psych: Normal affect   Labs    Chemistry Recent Labs  Lab 12/31/18 0918 01/01/19 0444  NA 140 139  K 3.7 3.4*  CL 103 106  CO2 27 22  GLUCOSE 94 89  BUN 10 9  CREATININE 1.04* 0.93  CALCIUM 9.5 8.6*  GFRNONAA 55* >60  GFRAA >  60 >60  ANIONGAP 10 11     Hematology Recent Labs  Lab 01/02/19 0806 01/03/19 0312 01/04/19 0332  WBC 6.9 7.4 7.6  RBC 4.13 3.88 4.04  HGB 11.9* 11.2* 11.7*  HCT 37.1 34.7* 36.5  MCV 89.8 89.4 90.3  MCH 28.8 28.9 29.0  MCHC 32.1 32.3 32.1  RDW 12.7 12.8 12.9  PLT 231 245 226    Cardiac Enzymes Recent Labs  Lab 12/31/18 1612 12/31/18 2207 01/01/19 0444  TROPONINI <0.03 <0.03 <0.03    Recent Labs  Lab 12/31/18 0916  TROPIPOC 0.01     BNPNo results for input(s): BNP, PROBNP in the last 168 hours.   DDimer No results for input(s): DDIMER in the last 168 hours.   Radiology    No results found.  Cardiac Studies   2D echo (12/31/2018)  IMPRESSIONS   1. The left ventricle has normal systolic function with an ejection fraction of 60-65%. The cavity  size was normal. There is mild asymmetric left ventricular hypertrophy. Left ventricular diastolic parameters were normal. No evidence of left ventricular regional wall motion abnormalities. 2. The right ventricle has normal systolic function. The cavity was normal. There is no increase in right ventricular wall thickness. 3. The aortic valve is tricuspid Aortic valve regurgitation is mild by color flow Doppler. 4. The inferior vena cava was normal in size with <50% respiratory variability.   Cardiac catheterization (01/01/2019)  Conclusion     Mid LAD lesion is 75% stenosed.  Prox LAD lesion is 40% stenosed.  Ost 1st Diag lesion is 95% stenosed.  Prox Cx lesion is 95% stenosed.  Dist RCA lesion is 80% stenosed.  The left ventricular systolic function is normal.  LV end diastolic pressure is normal.  The left ventricular ejection fraction is 55-65% by visual estimate.  There is no aortic valve stenosis.  Multivessel CAD including severe disease at the mid LAD, proximal diagonal, proximal circumflex and RCA at the bifurcation of the PDA and PLA.     Patient Profile     68 y.o. female with PMH of HLD, prediabetes and R breast CA s/p radiation and R lumpectomy presented with worsening exertional chest pain.A 2D echo was normal. Cardiac catheterization revealed three-vessel disease. Dr. Irish Lack thought that this was "surgical anatomy" and she has been seen by the cardiothoracic surgeons.  Assessment & Plan      1. CAD: Awaiting CABG. Asymptomatic. Continue ASA, atorvastatin, and IV heparin. No beta blockers due to resting bradycardia.  2. HTN: BP is normal. No changes.  3. Hyperlipidemia: LDL 166. Now on high intensity statin therapy with atorvastatin 80 mg.  4. Bradycardia: HR in low 40 bpm range earlier this morning during awake hours, associated with symptoms of sluggishness. HR currently in low 60 bpm range. Continue to monitor.   For questions or  updates, please contact [place] Please consult [email or web address] for contact info under Cardiology/STEMI.      Signed, Kate Sable, MD  01/04/2019, 2:28 PM

## 2019-01-05 ENCOUNTER — Inpatient Hospital Stay (HOSPITAL_COMMUNITY): Payer: Medicare Other | Admitting: Certified Registered Nurse Anesthetist

## 2019-01-05 ENCOUNTER — Inpatient Hospital Stay (HOSPITAL_COMMUNITY): Payer: Medicare Other

## 2019-01-05 ENCOUNTER — Inpatient Hospital Stay (HOSPITAL_COMMUNITY)
Admission: EM | Disposition: A | Discharge status: Home / Self Care | Payer: Self-pay | Source: Home / Self Care | Attending: Cardiovascular Disease

## 2019-01-05 DIAGNOSIS — Z951 Presence of aortocoronary bypass graft: Secondary | ICD-10-CM

## 2019-01-05 DIAGNOSIS — I2510 Atherosclerotic heart disease of native coronary artery without angina pectoris: Secondary | ICD-10-CM

## 2019-01-05 HISTORY — PX: CORONARY ARTERY BYPASS GRAFT: SHX141

## 2019-01-05 HISTORY — PX: TEE WITHOUT CARDIOVERSION: SHX5443

## 2019-01-05 LAB — POCT I-STAT 4, (NA,K, GLUC, HGB,HCT)
Glucose, Bld: 102 mg/dL — ABNORMAL HIGH (ref 70–99)
Glucose, Bld: 95 mg/dL (ref 70–99)
Glucose, Bld: 130 mg/dL — ABNORMAL HIGH (ref 70–99)
Glucose, Bld: 105 mg/dL — ABNORMAL HIGH (ref 70–99)
HCT: 33 % — ABNORMAL LOW (ref 36.0–46.0)
HCT: 29 % — ABNORMAL LOW (ref 36.0–46.0)
HCT: 25 % — ABNORMAL LOW (ref 36.0–46.0)
HCT: 26 % — ABNORMAL LOW (ref 36.0–46.0)
Hemoglobin: 11.2 g/dL — ABNORMAL LOW (ref 12.0–15.0)
Hemoglobin: 9.9 g/dL — ABNORMAL LOW (ref 12.0–15.0)
Hemoglobin: 8.5 g/dL — ABNORMAL LOW (ref 12.0–15.0)
Hemoglobin: 8.8 g/dL — ABNORMAL LOW (ref 12.0–15.0)
Potassium: 3.8 mmol/L (ref 3.5–5.1)
Potassium: 3.7 mmol/L (ref 3.5–5.1)
Potassium: 4.4 mmol/L (ref 3.5–5.1)
Potassium: 4.2 mmol/L (ref 3.5–5.1)
SODIUM: 140 mmol/L (ref 135–145)
SODIUM: 138 mmol/L (ref 135–145)
Sodium: 139 mmol/L (ref 135–145)
Sodium: 137 mmol/L (ref 135–145)

## 2019-01-05 LAB — BASIC METABOLIC PANEL
ANION GAP: 5 (ref 5–15)
Anion gap: 6 (ref 5–15)
BUN: 9 mg/dL (ref 8–23)
BUN: 8 mg/dL (ref 8–23)
CHLORIDE: 108 mmol/L (ref 98–111)
CO2: 24 mmol/L (ref 22–32)
CO2: 22 mmol/L (ref 22–32)
CREATININE: 0.89 mg/dL (ref 0.44–1.00)
Calcium: 9.2 mg/dL (ref 8.9–10.3)
Calcium: 7.5 mg/dL — ABNORMAL LOW (ref 8.9–10.3)
Chloride: 106 mmol/L (ref 98–111)
Creatinine, Ser: 0.95 mg/dL (ref 0.44–1.00)
GFR calc Af Amer: >60 mL/min (ref >60)
GFR calc Af Amer: >60 mL/min (ref >60)
GFR calc non Af Amer: >60 mL/min (ref >60)
GFR calc non Af Amer: >60 mL/min (ref >60)
Glucose, Bld: 100 mg/dL — ABNORMAL HIGH (ref 70–99)
Glucose, Bld: 171 mg/dL — ABNORMAL HIGH (ref 70–99)
Potassium: 3.6 mmol/L (ref 3.5–5.1)
Potassium: 4.9 mmol/L (ref 3.5–5.1)
Sodium: 135 mmol/L (ref 135–145)
Sodium: 136 mmol/L (ref 135–145)

## 2019-01-05 LAB — CBC
HCT: 37.5 % (ref 36.0–46.0)
HCT: 34.1 % — ABNORMAL LOW (ref 36.0–46.0)
HCT: 34.2 % — ABNORMAL LOW (ref 36.0–46.0)
Hemoglobin: 11.8 g/dL — ABNORMAL LOW (ref 12.0–15.0)
Hemoglobin: 11.2 g/dL — ABNORMAL LOW (ref 12.0–15.0)
Hemoglobin: 10.8 g/dL — ABNORMAL LOW (ref 12.0–15.0)
MCH: 28.1 pg (ref 26.0–34.0)
MCH: 29.6 pg (ref 26.0–34.0)
MCH: 28.6 pg (ref 26.0–34.0)
MCHC: 31.5 g/dL (ref 30.0–36.0)
MCHC: 32.8 g/dL (ref 30.0–36.0)
MCHC: 31.6 g/dL (ref 30.0–36.0)
MCV: 89.3 fL (ref 80.0–100.0)
MCV: 90 fL (ref 80.0–100.0)
MCV: 90.5 fL (ref 80.0–100.0)
Platelets: 243 10*3/uL (ref 150–400)
Platelets: 127 10*3/uL — ABNORMAL LOW (ref 150–400)
Platelets: 130 10*3/uL — ABNORMAL LOW (ref 150–400)
RBC: 4.2 MIL/uL (ref 3.87–5.11)
RBC: 3.79 MIL/uL — ABNORMAL LOW (ref 3.87–5.11)
RBC: 3.78 MIL/uL — ABNORMAL LOW (ref 3.87–5.11)
RDW: 12.8 % (ref 11.5–15.5)
RDW: 13.1 % (ref 11.5–15.5)
RDW: 13.3 % (ref 11.5–15.5)
WBC: 7.5 10*3/uL (ref 4.0–10.5)
WBC: 14 10*3/uL — AB (ref 4.0–10.5)
WBC: 13.7 10*3/uL — ABNORMAL HIGH (ref 4.0–10.5)
nRBC: 0 % (ref 0.0–0.2)
nRBC: 0 % (ref 0.0–0.2)
nRBC: 0 % (ref 0.0–0.2)

## 2019-01-05 LAB — POCT I-STAT 7, (LYTES, BLD GAS, ICA,H+H)
ACID-BASE DEFICIT: 2 mmol/L (ref 0.0–2.0)
Acid-base deficit: 4 mmol/L — ABNORMAL HIGH (ref 0.0–2.0)
BICARBONATE: 23.3 mmol/L (ref 20.0–28.0)
Bicarbonate: 23.9 mmol/L (ref 20.0–28.0)
Bicarbonate: 22.6 mmol/L (ref 20.0–28.0)
Calcium, Ion: 0.93 mmol/L — ABNORMAL LOW (ref 1.15–1.40)
Calcium, Ion: 1.11 mmol/L — ABNORMAL LOW (ref 1.15–1.40)
Calcium, Ion: 1.14 mmol/L — ABNORMAL LOW (ref 1.15–1.40)
HCT: 21 % — ABNORMAL LOW (ref 36.0–46.0)
HCT: 32 % — ABNORMAL LOW (ref 36.0–46.0)
HCT: 32 % — ABNORMAL LOW (ref 36.0–46.0)
HEMOGLOBIN: 7.1 g/dL — AB (ref 12.0–15.0)
Hemoglobin: 10.9 g/dL — ABNORMAL LOW (ref 12.0–15.0)
Hemoglobin: 10.9 g/dL — ABNORMAL LOW (ref 12.0–15.0)
O2 Saturation: 100 %
O2 Saturation: 97 %
O2 Saturation: 98 %
PO2 ART: 448 mmHg — AB (ref 83.0–108.0)
PO2 ART: 101 mmHg (ref 83.0–108.0)
Patient temperature: 37.2
Potassium: 3.6 mmol/L (ref 3.5–5.1)
Potassium: 4.8 mmol/L (ref 3.5–5.1)
Potassium: 4.9 mmol/L (ref 3.5–5.1)
Sodium: 141 mmol/L (ref 135–145)
Sodium: 138 mmol/L (ref 135–145)
Sodium: 137 mmol/L (ref 135–145)
TCO2: 24 mmol/L (ref 22–32)
TCO2: 25 mmol/L (ref 22–32)
TCO2: 24 mmol/L (ref 22–32)
pCO2 arterial: 32.1 mmHg (ref 32.0–48.0)
pCO2 arterial: 47.1 mmHg (ref 32.0–48.0)
pCO2 arterial: 49.9 mmHg — ABNORMAL HIGH (ref 32.0–48.0)
pH, Arterial: 7.468 — ABNORMAL HIGH (ref 7.350–7.450)
pH, Arterial: 7.315 — ABNORMAL LOW (ref 7.350–7.450)
pH, Arterial: 7.265 — ABNORMAL LOW (ref 7.350–7.450)
pO2, Arterial: 122 mmHg — ABNORMAL HIGH (ref 83.0–108.0)

## 2019-01-05 LAB — GLUCOSE, CAPILLARY
Glucose-Capillary: 100 mg/dL — ABNORMAL HIGH (ref 70–99)
Glucose-Capillary: 112 mg/dL — ABNORMAL HIGH (ref 70–99)
Glucose-Capillary: 117 mg/dL — ABNORMAL HIGH (ref 70–99)
Glucose-Capillary: 143 mg/dL — ABNORMAL HIGH (ref 70–99)
Glucose-Capillary: 122 mg/dL — ABNORMAL HIGH (ref 70–99)

## 2019-01-05 LAB — ECHO INTRAOPERATIVE TEE
HEIGHTINCHES: 66 in
Weight: 2913.6 oz

## 2019-01-05 LAB — HEPARIN LEVEL (UNFRACTIONATED): Heparin Unfractionated: 0.38 IU/mL (ref 0.30–0.70)

## 2019-01-05 LAB — PREPARE RBC (CROSSMATCH)

## 2019-01-05 LAB — HEMOGLOBIN AND HEMATOCRIT, BLOOD
HCT: 27.9 % — ABNORMAL LOW (ref 36.0–46.0)
Hemoglobin: 8.9 g/dL — ABNORMAL LOW (ref 12.0–15.0)

## 2019-01-05 LAB — PROTIME-INR
INR: 1.5 — ABNORMAL HIGH (ref 0.8–1.2)
Prothrombin Time: 17.9 seconds — ABNORMAL HIGH (ref 11.4–15.2)

## 2019-01-05 LAB — PLATELET COUNT: Platelets: 128 10*3/uL — ABNORMAL LOW (ref 150–400)

## 2019-01-05 LAB — APTT: aPTT: 35 seconds (ref 24–36)

## 2019-01-05 SURGERY — CORONARY ARTERY BYPASS GRAFTING (CABG)
Anesthesia: General | Site: Chest

## 2019-01-05 MED ORDER — ONDANSETRON HCL 4 MG/2ML IJ SOLN
INTRAMUSCULAR | Status: AC
  Filled 2019-01-05: qty 2

## 2019-01-05 MED ORDER — ONDANSETRON HCL 4 MG/2ML IJ SOLN
4.0000 mg | Freq: Four times a day (QID) | INTRAMUSCULAR | Status: DC | PRN
  Administered 2019-01-06 (×3): 4 mg via INTRAVENOUS
  Filled 2019-01-05 (×4): qty 2

## 2019-01-05 MED ORDER — ONDANSETRON HCL 4 MG/2ML IJ SOLN
INTRAMUSCULAR | Status: DC | PRN
  Administered 2019-01-05: 4 mg via INTRAVENOUS

## 2019-01-05 MED ORDER — LACTATED RINGERS IV SOLN
INTRAVENOUS | Status: DC | PRN
  Administered 2019-01-05: 07:00:00 via INTRAVENOUS

## 2019-01-05 MED ORDER — ACETAMINOPHEN 650 MG RE SUPP
650.0000 mg | Freq: Once | RECTAL | Status: AC
  Administered 2019-01-05: 650 mg via RECTAL

## 2019-01-05 MED ORDER — METOPROLOL TARTRATE 5 MG/5ML IV SOLN: 2.5000 mg | INTRAVENOUS | Status: DC | PRN

## 2019-01-05 MED ORDER — INSULIN REGULAR(HUMAN) IN NACL 100-0.9 UT/100ML-% IV SOLN: INTRAVENOUS | Status: DC

## 2019-01-05 MED ORDER — MORPHINE SULFATE (PF) 2 MG/ML IV SOLN
1.0000 mg | INTRAVENOUS | Status: DC | PRN
  Administered 2019-01-06: 4 mg via INTRAVENOUS
  Filled 2019-01-05: qty 2

## 2019-01-05 MED ORDER — BISACODYL 10 MG RE SUPP: 10.0000 mg | Freq: Every day | RECTAL | Status: DC

## 2019-01-05 MED ORDER — LACTATED RINGERS IV SOLN
INTRAVENOUS | Status: DC | PRN
  Administered 2019-01-05 (×2): via INTRAVENOUS

## 2019-01-05 MED ORDER — HEPARIN SODIUM (PORCINE) 1000 UNIT/ML IJ SOLN
INTRAMUSCULAR | Status: AC
  Filled 2019-01-05: qty 1

## 2019-01-05 MED ORDER — SODIUM CHLORIDE 0.9 % IV SOLN: INTRAVENOUS | Status: DC

## 2019-01-05 MED ORDER — 0.9 % SODIUM CHLORIDE (POUR BTL) OPTIME
TOPICAL | Status: DC | PRN
  Administered 2019-01-05: 6000 mL

## 2019-01-05 MED ORDER — FENTANYL CITRATE (PF) 250 MCG/5ML IJ SOLN
INTRAMUSCULAR | Status: AC
  Filled 2019-01-05: qty 25

## 2019-01-05 MED ORDER — DIPHENHYDRAMINE HCL 50 MG/ML IJ SOLN
INTRAMUSCULAR | Status: AC
  Filled 2019-01-05: qty 1

## 2019-01-05 MED ORDER — LACTATED RINGERS IV SOLN: INTRAVENOUS | Status: DC

## 2019-01-05 MED ORDER — THROMBIN 20000 UNITS EX SOLR
CUTANEOUS | Status: DC | PRN
  Administered 2019-01-05: 20000 [IU] via TOPICAL

## 2019-01-05 MED ORDER — OXYCODONE HCL 5 MG PO TABS
5.0000 mg | ORAL_TABLET | ORAL | Status: DC | PRN
  Administered 2019-01-06: 10 mg via ORAL
  Filled 2019-01-05: qty 2

## 2019-01-05 MED ORDER — ROCURONIUM BROMIDE 10 MG/ML (PF) SYRINGE
PREFILLED_SYRINGE | INTRAVENOUS | Status: DC | PRN
  Administered 2019-01-05: 100 mg via INTRAVENOUS

## 2019-01-05 MED ORDER — INSULIN ASPART 100 UNIT/ML ~~LOC~~ SOLN
0.0000 [IU] | SUBCUTANEOUS | Status: DC
  Administered 2019-01-05 (×2): 2 [IU] via SUBCUTANEOUS

## 2019-01-05 MED ORDER — ACETAMINOPHEN 160 MG/5ML PO SOLN: 650.0000 mg | Freq: Once | ORAL | Status: AC

## 2019-01-05 MED ORDER — METOPROLOL TARTRATE 25 MG/10 ML ORAL SUSPENSION: 12.5000 mg | Freq: Two times a day (BID) | ORAL | Status: DC

## 2019-01-05 MED ORDER — PROTAMINE SULFATE 10 MG/ML IV SOLN
INTRAVENOUS | Status: AC
  Filled 2019-01-05: qty 5

## 2019-01-05 MED ORDER — SODIUM CHLORIDE 0.9 % IV SOLN
1.5000 g | Freq: Two times a day (BID) | INTRAVENOUS | Status: DC
  Filled 2019-01-05: qty 1.5

## 2019-01-05 MED ORDER — PANTOPRAZOLE SODIUM 40 MG PO TBEC
40.0000 mg | DELAYED_RELEASE_TABLET | Freq: Every day | ORAL | Status: DC
  Administered 2019-01-07: 40 mg via ORAL
  Filled 2019-01-05: qty 1

## 2019-01-05 MED ORDER — MIDAZOLAM HCL 2 MG/2ML IJ SOLN
INTRAMUSCULAR | Status: AC
  Filled 2019-01-05: qty 2

## 2019-01-05 MED ORDER — DIPHENHYDRAMINE HCL 50 MG/ML IJ SOLN
INTRAMUSCULAR | Status: DC | PRN
  Administered 2019-01-05: 12.5 mg via INTRAVENOUS

## 2019-01-05 MED ORDER — MIDAZOLAM HCL 2 MG/2ML IJ SOLN: 2.0000 mg | INTRAMUSCULAR | Status: DC | PRN

## 2019-01-05 MED ORDER — PHENYLEPHRINE 40 MCG/ML (10ML) SYRINGE FOR IV PUSH (FOR BLOOD PRESSURE SUPPORT)
PREFILLED_SYRINGE | INTRAVENOUS | Status: AC
  Filled 2019-01-05: qty 10

## 2019-01-05 MED ORDER — SODIUM CHLORIDE 0.9 % IV SOLN
INTRAVENOUS | Status: DC | PRN
  Administered 2019-01-05: 20 ug/min via INTRAVENOUS

## 2019-01-05 MED ORDER — ALBUMIN HUMAN 5 % IV SOLN
250.0000 mL | INTRAVENOUS | Status: AC | PRN
  Administered 2019-01-05 (×2): 12.5 g via INTRAVENOUS

## 2019-01-05 MED ORDER — NITROGLYCERIN IN D5W 200-5 MCG/ML-% IV SOLN: 0.0000 ug/min | INTRAVENOUS | Status: DC

## 2019-01-05 MED ORDER — VECURONIUM BROMIDE 10 MG IV SOLR
INTRAVENOUS | Status: AC
  Filled 2019-01-05: qty 10

## 2019-01-05 MED ORDER — PROPOFOL 10 MG/ML IV BOLUS
INTRAVENOUS | Status: AC
  Filled 2019-01-05: qty 20

## 2019-01-05 MED ORDER — ALBUMIN HUMAN 5 % IV SOLN
INTRAVENOUS | Status: DC | PRN
  Administered 2019-01-05: 13:00:00 via INTRAVENOUS

## 2019-01-05 MED ORDER — TRAMADOL HCL 50 MG PO TABS: 50.0000 mg | ORAL_TABLET | ORAL | Status: DC | PRN

## 2019-01-05 MED ORDER — PROPOFOL 10 MG/ML IV BOLUS
INTRAVENOUS | Status: DC | PRN
  Administered 2019-01-05 (×3): 50 mg via INTRAVENOUS

## 2019-01-05 MED ORDER — THROMBIN (RECOMBINANT) 20000 UNITS EX SOLR
CUTANEOUS | Status: AC
  Filled 2019-01-05: qty 20000

## 2019-01-05 MED ORDER — ORAL CARE MOUTH RINSE
15.0000 mL | OROMUCOSAL | Status: DC
  Administered 2019-01-05 (×2): 15 mL via OROMUCOSAL

## 2019-01-05 MED ORDER — MIDAZOLAM HCL (PF) 10 MG/2ML IJ SOLN
INTRAMUSCULAR | Status: AC
  Filled 2019-01-05: qty 2

## 2019-01-05 MED ORDER — ASPIRIN EC 325 MG PO TBEC
325.0000 mg | DELAYED_RELEASE_TABLET | Freq: Every day | ORAL | Status: DC
  Administered 2019-01-06 – 2019-01-07 (×2): 325 mg via ORAL
  Filled 2019-01-05 (×2): qty 1

## 2019-01-05 MED ORDER — PHENYLEPHRINE 40 MCG/ML (10ML) SYRINGE FOR IV PUSH (FOR BLOOD PRESSURE SUPPORT)
PREFILLED_SYRINGE | INTRAVENOUS | Status: DC | PRN
  Administered 2019-01-05 (×7): 80 ug via INTRAVENOUS

## 2019-01-05 MED ORDER — PLASMA-LYTE 148 IV SOLN
INTRAVENOUS | Status: DC | PRN
  Administered 2019-01-05: 500 mL via INTRAVASCULAR

## 2019-01-05 MED ORDER — SODIUM CHLORIDE 0.9 % IV SOLN: 250.0000 mL | INTRAVENOUS | Status: DC

## 2019-01-05 MED ORDER — CHLORHEXIDINE GLUCONATE 0.12% ORAL RINSE (MEDLINE KIT)
15.0000 mL | Freq: Two times a day (BID) | OROMUCOSAL | Status: DC
  Administered 2019-01-05: 15 mL via OROMUCOSAL

## 2019-01-05 MED ORDER — ROCURONIUM BROMIDE 50 MG/5ML IV SOSY
PREFILLED_SYRINGE | INTRAVENOUS | Status: AC
  Filled 2019-01-05: qty 10

## 2019-01-05 MED ORDER — INSULIN REGULAR BOLUS VIA INFUSION
0.0000 [IU] | Freq: Three times a day (TID) | INTRAVENOUS | Status: DC
  Filled 2019-01-05: qty 10

## 2019-01-05 MED ORDER — FENTANYL CITRATE (PF) 250 MCG/5ML IJ SOLN
INTRAMUSCULAR | Status: AC
  Filled 2019-01-05: qty 5

## 2019-01-05 MED ORDER — VANCOMYCIN HCL IN DEXTROSE 1-5 GM/200ML-% IV SOLN
1000.0000 mg | Freq: Once | INTRAVENOUS | Status: AC
  Administered 2019-01-05: 1000 mg via INTRAVENOUS
  Filled 2019-01-05: qty 200

## 2019-01-05 MED ORDER — ASPIRIN 81 MG PO CHEW: 324.0000 mg | CHEWABLE_TABLET | Freq: Every day | ORAL | Status: DC

## 2019-01-05 MED ORDER — DOCUSATE SODIUM 100 MG PO CAPS
200.0000 mg | ORAL_CAPSULE | Freq: Every day | ORAL | Status: DC
  Administered 2019-01-06 – 2019-01-07 (×2): 200 mg via ORAL
  Filled 2019-01-05 (×2): qty 2

## 2019-01-05 MED ORDER — ACETAMINOPHEN 500 MG PO TABS
1000.0000 mg | ORAL_TABLET | Freq: Four times a day (QID) | ORAL | Status: DC
  Administered 2019-01-06 – 2019-01-07 (×2): 1000 mg via ORAL
  Filled 2019-01-05 (×4): qty 2

## 2019-01-05 MED ORDER — POTASSIUM CHLORIDE 10 MEQ/50ML IV SOLN: 10.0000 meq | INTRAVENOUS | Status: AC

## 2019-01-05 MED ORDER — MIDAZOLAM HCL 2 MG/2ML IJ SOLN
INTRAMUSCULAR | Status: DC | PRN
  Administered 2019-01-05: 3 mg via INTRAVENOUS
  Administered 2019-01-05 (×3): 2 mg via INTRAVENOUS
  Administered 2019-01-05: 3 mg via INTRAVENOUS

## 2019-01-05 MED ORDER — FAMOTIDINE 20 MG IN NS 100 ML IVPB
20.0000 mg | Freq: Two times a day (BID) | INTRAVENOUS | Status: AC
  Administered 2019-01-05: 20 mg via INTRAVENOUS
  Filled 2019-01-05 (×2): qty 100

## 2019-01-05 MED ORDER — VECURONIUM BROMIDE 10 MG IV SOLR
INTRAVENOUS | Status: DC | PRN
  Administered 2019-01-05 (×2): 5 mg via INTRAVENOUS

## 2019-01-05 MED ORDER — METOPROLOL TARTRATE 12.5 MG HALF TABLET: 12.5000 mg | ORAL_TABLET | Freq: Two times a day (BID) | ORAL | Status: DC

## 2019-01-05 MED ORDER — HEMOSTATIC AGENTS (NO CHARGE) OPTIME
TOPICAL | Status: DC | PRN
  Administered 2019-01-05 (×2): 1 via TOPICAL

## 2019-01-05 MED ORDER — LACTATED RINGERS IV SOLN
500.0000 mL | Freq: Once | INTRAVENOUS | Status: AC | PRN
  Administered 2019-01-05: 15:00:00 via INTRAVENOUS

## 2019-01-05 MED ORDER — HEPARIN SODIUM (PORCINE) 1000 UNIT/ML IJ SOLN
INTRAMUSCULAR | Status: DC | PRN
  Administered 2019-01-05: 26000 [IU] via INTRAVENOUS
  Administered 2019-01-05: 10000 [IU] via INTRAVENOUS

## 2019-01-05 MED ORDER — FENTANYL CITRATE (PF) 250 MCG/5ML IJ SOLN
INTRAMUSCULAR | Status: DC | PRN
  Administered 2019-01-05: 50 ug via INTRAVENOUS
  Administered 2019-01-05 (×3): 100 ug via INTRAVENOUS
  Administered 2019-01-05: 150 ug via INTRAVENOUS
  Administered 2019-01-05: 500 ug via INTRAVENOUS
  Administered 2019-01-05: 250 ug via INTRAVENOUS
  Administered 2019-01-05: 50 ug via INTRAVENOUS
  Administered 2019-01-05 (×2): 100 ug via INTRAVENOUS

## 2019-01-05 MED ORDER — PROTAMINE SULFATE 10 MG/ML IV SOLN
INTRAVENOUS | Status: AC
  Filled 2019-01-05: qty 25

## 2019-01-05 MED ORDER — ACETAMINOPHEN 160 MG/5ML PO SOLN: 1000.0000 mg | Freq: Four times a day (QID) | ORAL | Status: DC

## 2019-01-05 MED ORDER — SODIUM CHLORIDE 0.45 % IV SOLN
INTRAVENOUS | Status: DC | PRN
  Administered 2019-01-05: 14:00:00 via INTRAVENOUS

## 2019-01-05 MED ORDER — BISACODYL 5 MG PO TBEC
10.0000 mg | DELAYED_RELEASE_TABLET | Freq: Every day | ORAL | Status: DC
  Administered 2019-01-06 – 2019-01-07 (×2): 10 mg via ORAL
  Filled 2019-01-05 (×2): qty 2

## 2019-01-05 MED ORDER — PHENYLEPHRINE HCL-NACL 20-0.9 MG/250ML-% IV SOLN: 0.0000 ug/min | INTRAVENOUS | Status: DC

## 2019-01-05 MED ORDER — SODIUM CHLORIDE 0.9 % IV SOLN
1.5000 g | Freq: Two times a day (BID) | INTRAVENOUS | Status: AC
  Administered 2019-01-05 – 2019-01-07 (×4): 1.5 g via INTRAVENOUS
  Filled 2019-01-05 (×4): qty 1.5

## 2019-01-05 MED ORDER — EPHEDRINE 5 MG/ML INJ
INTRAVENOUS | Status: AC
  Filled 2019-01-05: qty 10

## 2019-01-05 MED ORDER — SODIUM CHLORIDE 0.9% FLUSH
3.0000 mL | Freq: Two times a day (BID) | INTRAVENOUS | Status: DC
  Administered 2019-01-06: 3 mL via INTRAVENOUS
  Administered 2019-01-06: 10 mL via INTRAVENOUS
  Administered 2019-01-07: 3 mL via INTRAVENOUS

## 2019-01-05 MED ORDER — PROTAMINE SULFATE 10 MG/ML IV SOLN
INTRAVENOUS | Status: DC | PRN
  Administered 2019-01-05: 70 mg via INTRAVENOUS
  Administered 2019-01-05: 50 mg via INTRAVENOUS
  Administered 2019-01-05: 60 mg via INTRAVENOUS
  Administered 2019-01-05: 55 mg via INTRAVENOUS
  Administered 2019-01-05: 25 mg via INTRAVENOUS

## 2019-01-05 MED ORDER — MAGNESIUM SULFATE 4 GM/100ML IV SOLN
4.0000 g | Freq: Once | INTRAVENOUS | Status: AC
  Administered 2019-01-05: 4 g via INTRAVENOUS
  Filled 2019-01-05: qty 100

## 2019-01-05 MED ORDER — THROMBIN 20000 UNITS EX SOLR
OROMUCOSAL | Status: DC | PRN
  Administered 2019-01-05 (×3): 4 mL via TOPICAL

## 2019-01-05 MED ORDER — SODIUM CHLORIDE 0.9% FLUSH: 3.0000 mL | INTRAVENOUS | Status: DC | PRN

## 2019-01-05 MED ORDER — CHLORHEXIDINE GLUCONATE 0.12 % MT SOLN
15.0000 mL | OROMUCOSAL | Status: AC
  Administered 2019-01-05: 15 mL via OROMUCOSAL

## 2019-01-05 MED ORDER — DEXMEDETOMIDINE HCL IN NACL 200 MCG/50ML IV SOLN: 0.0000 ug/kg/h | INTRAVENOUS | Status: DC

## 2019-01-05 SURGICAL SUPPLY — 108 items
BAG DECANTER FOR FLEXI CONT (MISCELLANEOUS) ×4 IMPLANT
BANDAGE ACE 4X5 VEL STRL LF (GAUZE/BANDAGES/DRESSINGS) ×4 IMPLANT
BANDAGE ACE 6X5 VEL STRL LF (GAUZE/BANDAGES/DRESSINGS) ×4 IMPLANT
BANDAGE ELASTIC 4 VELCRO ST LF (GAUZE/BANDAGES/DRESSINGS) ×4 IMPLANT
BANDAGE ELASTIC 6 VELCRO ST LF (GAUZE/BANDAGES/DRESSINGS) ×4 IMPLANT
BASKET HEART  (ORDER IN 25'S) (MISCELLANEOUS) ×1
BASKET HEART (ORDER IN 25'S) (MISCELLANEOUS) ×1
BASKET HEART (ORDER IN 25S) (MISCELLANEOUS) ×2 IMPLANT
BLADE STERNUM SYSTEM 6 (BLADE) ×4 IMPLANT
BNDG GAUZE ELAST 4 BULKY (GAUZE/BANDAGES/DRESSINGS) ×4 IMPLANT
CANISTER SUCT 3000ML PPV (MISCELLANEOUS) ×4 IMPLANT
CATH ROBINSON RED A/P 18FR (CATHETERS) ×8 IMPLANT
CATH THORACIC 28FR (CATHETERS) ×8 IMPLANT
CATH THORACIC 36FR (CATHETERS) ×4 IMPLANT
CATH THORACIC 36FR RT ANG (CATHETERS) ×4 IMPLANT
CLIP VESOCCLUDE MED 24/CT (CLIP) IMPLANT
CLIP VESOCCLUDE SM WIDE 24/CT (CLIP) ×4 IMPLANT
CONN Y 3/8X3/8X3/8  BEN (MISCELLANEOUS) ×2
CONN Y 3/8X3/8X3/8 BEN (MISCELLANEOUS) ×2 IMPLANT
COVER WAND RF STERILE (DRAPES) ×4 IMPLANT
CRADLE DONUT ADULT HEAD (MISCELLANEOUS) ×4 IMPLANT
DRAPE CARDIOVASCULAR INCISE (DRAPES) ×2
DRAPE SLUSH/WARMER DISC (DRAPES) ×4 IMPLANT
DRAPE SRG 135X102X78XABS (DRAPES) ×2 IMPLANT
DRSG AQUACEL AG ADV 3.5X14 (GAUZE/BANDAGES/DRESSINGS) ×4 IMPLANT
DRSG COVADERM 4X14 (GAUZE/BANDAGES/DRESSINGS) ×4 IMPLANT
ELECT CAUTERY BLADE 6.4 (BLADE) ×8 IMPLANT
ELECT REM PT RETURN 9FT ADLT (ELECTROSURGICAL) ×8
ELECTRODE REM PT RTRN 9FT ADLT (ELECTROSURGICAL) ×4 IMPLANT
FELT TEFLON 1X6 (MISCELLANEOUS) ×4 IMPLANT
GAUZE SPONGE 4X4 12PLY STRL (GAUZE/BANDAGES/DRESSINGS) ×4 IMPLANT
GAUZE SPONGE 4X4 12PLY STRL LF (GAUZE/BANDAGES/DRESSINGS) ×8 IMPLANT
GLOVE BIO SURGEON STRL SZ 6 (GLOVE) ×12 IMPLANT
GLOVE BIO SURGEON STRL SZ 6.5 (GLOVE) ×3 IMPLANT
GLOVE BIO SURGEON STRL SZ7 (GLOVE) IMPLANT
GLOVE BIO SURGEON STRL SZ7.5 (GLOVE) ×8 IMPLANT
GLOVE BIO SURGEONS STRL SZ 6.5 (GLOVE) ×1
GLOVE BIOGEL PI IND STRL 6 (GLOVE) ×8 IMPLANT
GLOVE BIOGEL PI IND STRL 6.5 (GLOVE) IMPLANT
GLOVE BIOGEL PI IND STRL 7.0 (GLOVE) IMPLANT
GLOVE BIOGEL PI INDICATOR 6 (GLOVE) ×8
GLOVE BIOGEL PI INDICATOR 6.5 (GLOVE)
GLOVE BIOGEL PI INDICATOR 7.0 (GLOVE)
GLOVE EUDERMIC 7 POWDERFREE (GLOVE) ×16 IMPLANT
GLOVE ORTHO TXT STRL SZ7.5 (GLOVE) IMPLANT
GOWN STRL REUS W/ TWL LRG LVL3 (GOWN DISPOSABLE) ×8 IMPLANT
GOWN STRL REUS W/ TWL XL LVL3 (GOWN DISPOSABLE) ×12 IMPLANT
GOWN STRL REUS W/TWL LRG LVL3 (GOWN DISPOSABLE) ×8
GOWN STRL REUS W/TWL XL LVL3 (GOWN DISPOSABLE) ×12
HEMOSTAT POWDER SURGIFOAM 1G (HEMOSTASIS) ×12 IMPLANT
HEMOSTAT SURGICEL 2X14 (HEMOSTASIS) ×4 IMPLANT
INSERT FOGARTY 61MM (MISCELLANEOUS) IMPLANT
INSERT FOGARTY XLG (MISCELLANEOUS) IMPLANT
KIT BASIN OR (CUSTOM PROCEDURE TRAY) ×4 IMPLANT
KIT CATH CPB BARTLE (MISCELLANEOUS) ×4 IMPLANT
KIT SUCTION CATH 14FR (SUCTIONS) ×4 IMPLANT
KIT TURNOVER KIT B (KITS) ×4 IMPLANT
KIT VASOVIEW HEMOPRO 2 VH 4000 (KITS) ×4 IMPLANT
NS IRRIG 1000ML POUR BTL (IV SOLUTION) ×24 IMPLANT
PACK E OPEN HEART (SUTURE) ×4 IMPLANT
PACK OPEN HEART (CUSTOM PROCEDURE TRAY) ×4 IMPLANT
PAD ARMBOARD 7.5X6 YLW CONV (MISCELLANEOUS) ×12 IMPLANT
PAD ELECT DEFIB RADIOL ZOLL (MISCELLANEOUS) ×4 IMPLANT
PENCIL BUTTON HOLSTER BLD 10FT (ELECTRODE) ×8 IMPLANT
PUNCH AORTIC ROTATE 4.0MM (MISCELLANEOUS) ×4 IMPLANT
PUNCH AORTIC ROTATE 4.5MM 8IN (MISCELLANEOUS) ×4 IMPLANT
PUNCH AORTIC ROTATE 5MM 8IN (MISCELLANEOUS) IMPLANT
SET CARDIOPLEGIA MPS 5001102 (MISCELLANEOUS) ×4 IMPLANT
SPONGE INTESTINAL PEANUT (DISPOSABLE) IMPLANT
SPONGE LAP 18X18 RF (DISPOSABLE) ×4 IMPLANT
SPONGE LAP 4X18 RFD (DISPOSABLE) ×4 IMPLANT
SUT BONE WAX W31G (SUTURE) ×4 IMPLANT
SUT MNCRL AB 4-0 PS2 18 (SUTURE) IMPLANT
SUT PROLENE 3 0 SH DA (SUTURE) IMPLANT
SUT PROLENE 3 0 SH1 36 (SUTURE) ×4 IMPLANT
SUT PROLENE 4 0 RB 1 (SUTURE)
SUT PROLENE 4 0 SH DA (SUTURE) IMPLANT
SUT PROLENE 4-0 RB1 .5 CRCL 36 (SUTURE) IMPLANT
SUT PROLENE 5 0 C 1 36 (SUTURE) IMPLANT
SUT PROLENE 6 0 C 1 30 (SUTURE) ×4 IMPLANT
SUT PROLENE 7 0 BV 1 (SUTURE) IMPLANT
SUT PROLENE 7 0 BV1 MDA (SUTURE) ×12 IMPLANT
SUT PROLENE 8 0 BV175 6 (SUTURE) IMPLANT
SUT SILK  1 MH (SUTURE) ×2
SUT SILK 1 MH (SUTURE) ×2 IMPLANT
SUT SILK 2 0 SH (SUTURE) IMPLANT
SUT STEEL 6MS V (SUTURE) ×8 IMPLANT
SUT STEEL STERNAL CCS#1 18IN (SUTURE) IMPLANT
SUT STEEL SZ 6 DBL 3X14 BALL (SUTURE) IMPLANT
SUT TEM PAC WIRE 2 0 SH (SUTURE) ×4 IMPLANT
SUT VIC AB 1 CTX 36 (SUTURE) ×4
SUT VIC AB 1 CTX36XBRD ANBCTR (SUTURE) ×4 IMPLANT
SUT VIC AB 2-0 CT1 27 (SUTURE) ×14
SUT VIC AB 2-0 CT1 TAPERPNT 27 (SUTURE) ×14 IMPLANT
SUT VIC AB 2-0 CTX 27 (SUTURE) IMPLANT
SUT VIC AB 3-0 SH 27 (SUTURE)
SUT VIC AB 3-0 SH 27X BRD (SUTURE) IMPLANT
SUT VIC AB 3-0 X1 27 (SUTURE) ×20 IMPLANT
SUT VICRYL 4-0 PS2 18IN ABS (SUTURE) IMPLANT
SYSTEM SAHARA CHEST DRAIN ATS (WOUND CARE) ×4 IMPLANT
TAPE CLOTH SURG 4X10 WHT LF (GAUZE/BANDAGES/DRESSINGS) ×8 IMPLANT
TAPE PAPER 2X10 WHT MICROPORE (GAUZE/BANDAGES/DRESSINGS) ×4 IMPLANT
TOWEL GREEN STERILE (TOWEL DISPOSABLE) ×4 IMPLANT
TOWEL GREEN STERILE FF (TOWEL DISPOSABLE) ×4 IMPLANT
TRAY FOLEY SLVR 16FR TEMP STAT (SET/KITS/TRAYS/PACK) ×4 IMPLANT
TUBING LAP HI FLOW INSUFFLATIO (TUBING) ×4 IMPLANT
UNDERPAD 30X30 (UNDERPADS AND DIAPERS) ×4 IMPLANT
WATER STERILE IRR 1000ML POUR (IV SOLUTION) ×8 IMPLANT

## 2019-01-05 NOTE — Progress Notes (Signed)
Xray showed Swan coiled in [place]. Dr. Cyndia Bent manipulated Jessica [name] to correct position. Swan at 48 cm.

## 2019-01-05 NOTE — Progress Notes (Signed)
RT NOTES:  Patient does not have a cuff leak at this time. Dr Cyndia Bent aware. Instructed to continue with weaning once patient wakes up.

## 2019-01-05 NOTE — Op Note (Signed)
CARDIOVASCULAR SURGERY OPERATIVE NOTE  01/05/2019  Surgeon:  Gaye Pollack, MD  First Assistant: Jadene Pierini, PA-C   Preoperative Diagnosis:  Severe multi-vessel coronary artery disease   Postoperative Diagnosis:  Same   Procedure:  1. Median Sternotomy 2. Extracorporeal circulation 3.   Coronary artery bypass grafting x 5   Left internal mammary graft to the LAD  SVG to diagonal  Sequential SVG to OM1 and OM2  SVG to RCA 4.   Open vein harvest from the right leg   Anesthesia:  General Endotracheal   Clinical History/Surgical Indication:   She has had progressive exertional fatigue and shortness of breath for over a month but now has developed chest tightness over the week prior to admission and had two episodes at rest prompting visit to PCP. Echo shows normal LV systolic function with no significant valvular disease. Cath shows severe 3-vessel CAD. I agree that CABG is indicated in this patient with unstable angina. I discussed the operative procedure with the patient and her husband including alternatives, benefits and risks; including but not limited to bleeding, blood transfusion, infection, stroke, myocardial infarction, graft failure, heart block requiring a permanent pacemaker, organ dysfunction, and death.  Jessica [name] understands and agrees to proceed.    Preparation:  The patient was seen in the preoperative holding area and the correct patient, correct operation were confirmed with the patient after reviewing the medical record and catheterization. The consent was signed by me. Preoperative antibiotics were given. A pulmonary arterial line and radial arterial line were placed by the anesthesia team. The patient was taken back to the operating room and positioned supine on the operating room table. After being placed under general endotracheal anesthesia by the  anesthesia team a foley catheter was placed. The neck, chest, abdomen, and both legs were prepped with betadine soap and solution and draped in the usual sterile manner. A surgical time-out was taken and the correct patient and operative procedure were confirmed with the nursing and anesthesia staff.   Cardiopulmonary Bypass:  A median sternotomy was performed. The pericardium was opened in the midline. Right ventricular function appeared normal. The ascending aorta was of normal size and had no palpable plaque. There were no contraindications to aortic cannulation or cross-clamping. The patient was fully systemically heparinized and the ACT was maintained > 400 sec. The proximal aortic arch was cannulated with a 20 F aortic cannula for arterial inflow. Venous cannulation was performed via the right atrial appendage using a two-staged venous cannula. An antegrade cardioplegia/vent cannula was inserted into the mid-ascending aorta. Aortic occlusion was performed with a single cross-clamp. Systemic cooling to 32 degrees Centigrade and topical cooling of the heart with iced saline were used. Hyperkalemic antegrade cold blood cardioplegia was used to induce diastolic arrest and was then given at about 20 minute intervals throughout the period of arrest to maintain myocardial temperature at or below 10 degrees centigrade. A temperature probe was inserted into the interventricular septum and an insulating pad was placed in the pericardium.   Left internal mammary harvest:  The left side of the sternum was retracted using the Rultract retractor. The left internal mammary artery was harvested as a pedicle graft. All side branches were clipped. It was a small-sized vessel of good quality with good blood flow. It was ligated distally and divided. It was sprayed with topical papaverine solution to prevent vasospasm.    Open vein harvest:  We initially examined the greater saphenous vein medial to the right  knee.  There were two small veins in this location neither of which was large enough to use for vein grafts. We then looked for the left greater saphenous medical to the left knee and again it was very small. Therefore I decided to harvest the right greater saphenous vein from the thigh. This vein was identified in the upper thigh and was a medium caliber vein of good quality. It was harvested through multiple bridging incisions in the thigh. In the lower thigh it divided into two small veins that were not suitable.  Then another section of saphenous vein was harvested from the lower right leg from just above the ankle to the upper calf level where it divided into 3 small branches just below the knee. It was a medium-sized vein of good quality. The side branches were all ligated with 4-0 silk ties. Given our problems with the vein I considered harvesting the right internal mammary artery. It was examined and unfortunately there was a huge vein coursing with the artery that was the size of my small finger in places, particularly proximally. I thought that this would make harvesting the small IMA difficult and may lead to a lot of bleeding from this large fragile vein that would be difficult to control.   Coronary arteries:  The coronary arteries were examined.   LAD:  Severely diseased with calcific plaque throughout the proximal and mid vessel. The diagonal was also severely diseased proximally extending almost to the bifurcation into the sub-branches.   LCX:  OM1 and OM2 were diffusely diseased but graftable.  RCA:  Non-dominant but moderate caliber. There was severe proximal disease but the remainder of the vessel had minimal disease.   Grafts:  1. LIMA to the LAD: 1.75 mm. It was sewn end to side using 8-0 prolene continuous suture. 2. SVG to diagonal:  1.6 mm. It was sewn end to side using 7-0 prolene continuous suture. 3. Sequential SVG to OM1:  1.6 mm. It was sewn sequential side to side using 7-0  prolene continuous suture. 4. Sequential SVG to OM2:  1.6 mm. It was sewn sequential end to side using 7-0 prolene continuous suture. 5.   SVG to RCA:  1.6 mm. It was sewn end to side using 7-0 prolene continuous suture.  The proximal vein graft anastomoses were performed to the mid-ascending aorta using continuous 6-0 prolene suture. Graft markers were placed around the proximal anastomoses.   Completion:  The patient was rewarmed to 37 degrees Centigrade. The clamp was removed from the LIMA pedicle and there was rapid warming of the septum and return of ventricular fibrillation. The crossclamp was removed with a time of 93 minutes. There was spontaneous return of sinus rhythm. The distal and proximal anastomoses were checked for hemostasis. The position of the grafts was satisfactory. Two temporary epicardial pacing wires were placed on the right atrium and two on the right ventricle. The patient was weaned from CPB without difficulty on no inotropes. CPB time was 110 minutes. Cardiac output was 4.5 LPM. TEE showed normal LV systolic function. Heparin was fully reversed with protamine and the aortic and venous cannulas removed. Hemostasis was achieved. Mediastinal and bilateral pleural drainage tubes were placed. The sternum was closed with #6 stainless steel wires. The fascia was closed with continuous # 1 vicryl suture. The subcutaneous tissue was closed with 2-0 vicryl continuous suture. The skin was closed with 3-0 vicryl subcuticular suture. All sponge, needle, and instrument counts were reported correct at the end  of the case. Dry sterile dressings were placed over the incisions and around the chest tubes which were connected to pleurevac suction. The patient was then transported to the surgical intensive care unit in stable condition.

## 2019-01-05 NOTE — Anesthesia Postprocedure Evaluation (Signed)
Anesthesia Post Note  Patient: Jessica [name]  Procedure(s) Performed: CORONARY ARTERY BYPASS GRAFTING (CABG), using right leg saphenous endoscopic and open vein harvest, exploration left leg (N/A Chest) TRANSESOPHAGEAL ECHOCARDIOGRAM (TEE) (N/A )     Patient location during evaluation: SICU Anesthesia Type: General Level of consciousness: sedated Pain management: pain level controlled Vital Signs Assessment: post-procedure vital signs reviewed and stable Respiratory status: patient remains intubated per anesthesia plan Cardiovascular status: stable Postop Assessment: no apparent nausea or vomiting Anesthetic complications: no    Last Vitals:  Vitals:   01/05/19 0730 01/05/19 1413  BP:  103/69  Pulse: 66 89  Resp: 14 17  Temp:    SpO2: 97%     Last Pain:  Vitals:   01/04/19 2049  TempSrc: Oral  PainSc:                  Baili Stang,W. EDMOND

## 2019-01-05 NOTE — Anesthesia Procedure Notes (Signed)
Central Venous Catheter Insertion Performed by: Catalina Gravel, MD, anesthesiologist Start/End3/16/2020 7:10 AM, 01/05/2019 7:15 AM Patient location: Pre-op. Preanesthetic checklist: patient identified, IV checked, site marked, risks and benefits discussed, surgical consent, monitors and equipment checked, pre-op evaluation, timeout performed and anesthesia consent Hand hygiene performed  and maximum sterile barriers used  Total catheter length 100. PA cath was placed.Swan type:thermodilution PA Cath depth:63 Procedure performed without using ultrasound guided technique. Attempts: 1 Patient tolerated the procedure well with no immediate complications.

## 2019-01-05 NOTE — Transfer of Care (Signed)
Immediate Anesthesia Transfer of Care Note  Patient: ANNMARGARET DECAPRIO  Procedure(s) Performed: CORONARY ARTERY BYPASS GRAFTING (CABG), using right leg saphenous endoscopic and open vein harvest, exploration left leg (N/A Chest) TRANSESOPHAGEAL ECHOCARDIOGRAM (TEE) (N/A )  Patient Location: SICU  Anesthesia Type:General  Level of Consciousness: sedated, unresponsive and Patient remains intubated per anesthesia plan  Airway & Oxygen Therapy: Patient remains intubated per anesthesia plan and Patient placed on Ventilator (see vital sign flow sheet for setting)  Post-op Assessment: Report given to RN and Post -op Vital signs reviewed and stable  Post vital signs: Reviewed and stable  Last Vitals:  Vitals Value Taken Time  BP 103/69 01/05/2019  2:13 PM  Temp    Pulse 89 01/05/2019  2:13 PM  Resp 17 01/05/2019  2:13 PM  SpO2      Last Pain:  Vitals:   01/04/19 2049  TempSrc: Oral  PainSc:          Complications: No apparent anesthesia complications

## 2019-01-05 NOTE — Procedures (Signed)
Extubation Procedure Note  Patient Details:   Name: Jessica [name] DOB: 1951/02/06 MRN: [id number]   Airway Documentation:    Vent end date: 01/05/19 Vent end time: 1945   Evaluation  O2 sats: stable throughout Complications: No apparent complications Patient did tolerate procedure well. Bilateral Breath Sounds: Clear   Yes  Carson Myrtle 01/05/2019, 8:11 PM

## 2019-01-05 NOTE — Progress Notes (Signed)
NIF -28 FVC 0.8L

## 2019-01-05 NOTE — Anesthesia Procedure Notes (Signed)
Procedure Name: Intubation Date/Time: 01/05/2019 7:47 AM Performed by: Renato Shin, CRNA Pre-anesthesia Checklist: Patient identified, Emergency Drugs available, Suction available and Patient being monitored Patient Re-evaluated:Patient Re-evaluated prior to induction Oxygen Delivery Method: Circle system utilized Preoxygenation: Pre-oxygenation with 100% oxygen Induction Type: IV induction Ventilation: Mask ventilation without difficulty Laryngoscope Size: Miller and 2 Grade View: Grade II Tube type: Oral Tube size: 8.0 mm Number of attempts: 2 Airway Equipment and Method: Stylet Placement Confirmation: ETT inserted through vocal cords under direct vision,  positive ETCO2 and breath sounds checked- equal and bilateral Secured at: 21 cm Tube secured with: Tape Dental Injury: Teeth and Oropharynx as per pre-operative assessment

## 2019-01-05 NOTE — Brief Op Note (Signed)
12/31/2018 - 01/05/2019  7:25 AM  PATIENT:  Jessica [name]  68 y.o. female  PRE-OPERATIVE DIAGNOSIS:  CAD  POST-OPERATIVE DIAGNOSIS:  CAD  PROCEDURE:  Procedure(s): CORONARY ARTERY BYPASS GRAFTING (CABG), using right leg saphenous endoscopic and open vein harvest, exploration left leg (N/A) TRANSESOPHAGEAL ECHOCARDIOGRAM (TEE) (N/A)  SURGEON:  Surgeon(s) and Role:    * Bartle, Fernande Boyden, MD - Primary  PHYSICIAN ASSISTANT: Rupert Azzara PA-C, TESSA CONTE PA-C  ANESTHESIA:   general  EBL:  900 mL   BLOOD ADMINISTERED:see anesthesia and perfusion records  DRAINS: ROUTINE PLEURAL AND PERICARDIAL CHEST TUBES   LOCAL MEDICATIONS USED:  NONE  SPECIMEN:  No Specimen  DISPOSITION OF SPECIMEN:  N/A  COUNTS:  YES  TOURNIQUET:  * No tourniquets in log *  DICTATION: .Dragon Dictation  PLAN OF CARE: Admit to inpatient   PATIENT DISPOSITION:  ICU - intubated and hemodynamically stable.   Delay start of Pharmacological VTE agent (>24hrs) due to surgical blood loss or risk of bleeding: yes  COMPLICATIONS: NO KNOWN

## 2019-01-05 NOTE — Anesthesia Procedure Notes (Signed)
Arterial Line Insertion Start/End3/16/2020 7:04 AM, 01/05/2019 7:04 AM Performed by: CRNA  Patient location: Pre-op. Preanesthetic checklist: patient identified, IV checked, site marked, risks and benefits discussed, surgical consent, monitors and equipment checked, pre-op evaluation, timeout performed and anesthesia consent Lidocaine 1% used for infiltration and patient sedated Right, radial was placed Catheter size: 20 G Hand hygiene performed , maximum sterile barriers used  and Seldinger technique used Allen's test indicative of satisfactory collateral circulation Attempts: 1 Procedure performed without using ultrasound guided technique. Following insertion, dressing applied and Biopatch. Post procedure assessment: normal  Patient tolerated the procedure well with no immediate complications.

## 2019-01-05 NOTE — Progress Notes (Signed)
TCTS:  She had a stable weekend without any chest pain or shortness of breath. Ready for surgery this am. She has no further questions.

## 2019-01-05 NOTE — Anesthesia Procedure Notes (Signed)
Central Venous Catheter Insertion Performed by: Catalina Gravel, MD, anesthesiologist Start/End3/16/2020 7:00 AM, 01/05/2019 7:10 AM Patient location: Pre-op. Preanesthetic checklist: patient identified, IV checked, site marked, risks and benefits discussed, surgical consent, monitors and equipment checked, pre-op evaluation, timeout performed and anesthesia consent Position: Trendelenburg Lidocaine 1% used for infiltration and patient sedated Hand hygiene performed  and maximum sterile barriers used  Catheter size: 8.5 Fr Sheath introducer Procedure performed using ultrasound guided technique. Ultrasound Notes:anatomy identified, needle tip was noted to be adjacent to the nerve/plexus identified, no ultrasound evidence of intravascular and/or intraneural injection and image(s) printed for medical record Attempts: 1 Following insertion, line sutured and dressing applied. Post procedure assessment: blood return through all ports, free fluid flow and no air  Patient tolerated the procedure well with no immediate complications.

## 2019-01-05 NOTE — Progress Notes (Signed)
OK to extubate patient without cuff leak per Dr. Cyndia Bent.

## 2019-01-05 NOTE — Progress Notes (Signed)
RT NOTES: Rapid wean protocol initiated 

## 2019-01-05 NOTE — Progress Notes (Signed)
Pt placed on BIPAP 10/5 40% per MD order per ABG results.  Pt tolerating well at this time. RT will continue to monitor.

## 2019-01-05 NOTE — Progress Notes (Addendum)
CT surgery p.m. Rounds  Patient remains sedated, not able to initiate ventilator wean Hemodynamic stable on no inotropes Sinus rhythm Lungs clear Minimal chest tube output PA catheter repositioned for optimal waveform We will wait until level of consciousness is better before attempting vent wean

## 2019-01-06 ENCOUNTER — Encounter (HOSPITAL_COMMUNITY): Payer: Self-pay | Admitting: Surgery

## 2019-01-06 ENCOUNTER — Inpatient Hospital Stay (HOSPITAL_COMMUNITY): Payer: Medicare Other

## 2019-01-06 LAB — POCT I-STAT 7, (LYTES, BLD GAS, ICA,H+H)
Acid-base deficit: 3 mmol/L — ABNORMAL HIGH (ref 0.0–2.0)
Bicarbonate: 22.9 mmol/L (ref 20.0–28.0)
Calcium, Ion: 1.09 mmol/L — ABNORMAL LOW (ref 1.15–1.40)
HCT: 31 % — ABNORMAL LOW (ref 36.0–46.0)
Hemoglobin: 10.5 g/dL — ABNORMAL LOW (ref 12.0–15.0)
O2 Saturation: 99 %
POTASSIUM: 4 mmol/L (ref 3.5–5.1)
Patient temperature: 35.3
Sodium: 141 mmol/L (ref 135–145)
TCO2: 24 mmol/L (ref 22–32)
pCO2 arterial: 38 mmHg (ref 32.0–48.0)
pH, Arterial: 7.38 (ref 7.350–7.450)
pO2, Arterial: 116 mmHg — ABNORMAL HIGH (ref 83.0–108.0)

## 2019-01-06 LAB — BASIC METABOLIC PANEL
Anion gap: 8 (ref 5–15)
Anion gap: 6 (ref 5–15)
BUN: 8 mg/dL (ref 8–23)
BUN: 8 mg/dL (ref 8–23)
CALCIUM: 7.8 mg/dL — AB (ref 8.9–10.3)
CO2: 22 mmol/L (ref 22–32)
CO2: 23 mmol/L (ref 22–32)
Calcium: 7.9 mg/dL — ABNORMAL LOW (ref 8.9–10.3)
Chloride: 105 mmol/L (ref 98–111)
Chloride: 105 mmol/L (ref 98–111)
Creatinine, Ser: 0.84 mg/dL (ref 0.44–1.00)
Creatinine, Ser: 0.96 mg/dL (ref 0.44–1.00)
GFR calc Af Amer: >60 mL/min (ref >60)
GFR calc Af Amer: >60 mL/min (ref >60)
GFR calc non Af Amer: >60 mL/min (ref >60)
Glucose, Bld: 139 mg/dL — ABNORMAL HIGH (ref 70–99)
Glucose, Bld: 147 mg/dL — ABNORMAL HIGH (ref 70–99)
POTASSIUM: 4.6 mmol/L (ref 3.5–5.1)
Potassium: 4.2 mmol/L (ref 3.5–5.1)
SODIUM: 134 mmol/L — AB (ref 135–145)
Sodium: 135 mmol/L (ref 135–145)

## 2019-01-06 LAB — CBC
HCT: 31.7 % — ABNORMAL LOW (ref 36.0–46.0)
HEMATOCRIT: 33.9 % — AB (ref 36.0–46.0)
Hemoglobin: 10.8 g/dL — ABNORMAL LOW (ref 12.0–15.0)
Hemoglobin: 10 g/dL — ABNORMAL LOW (ref 12.0–15.0)
MCH: 28.7 pg (ref 26.0–34.0)
MCH: 29.3 pg (ref 26.0–34.0)
MCHC: 31.9 g/dL (ref 30.0–36.0)
MCHC: 31.5 g/dL (ref 30.0–36.0)
MCV: 90.2 fL (ref 80.0–100.0)
MCV: 93 fL (ref 80.0–100.0)
Platelets: 133 10*3/uL — ABNORMAL LOW (ref 150–400)
Platelets: 128 10*3/uL — ABNORMAL LOW (ref 150–400)
RBC: 3.76 MIL/uL — ABNORMAL LOW (ref 3.87–5.11)
RBC: 3.41 MIL/uL — ABNORMAL LOW (ref 3.87–5.11)
RDW: 13.5 % (ref 11.5–15.5)
RDW: 13.7 % (ref 11.5–15.5)
WBC: 12.8 10*3/uL — ABNORMAL HIGH (ref 4.0–10.5)
WBC: 12.5 10*3/uL — ABNORMAL HIGH (ref 4.0–10.5)
nRBC: 0 % (ref 0.0–0.2)
nRBC: 0 % (ref 0.0–0.2)

## 2019-01-06 LAB — GLUCOSE, CAPILLARY
Glucose-Capillary: 118 mg/dL — ABNORMAL HIGH (ref 70–99)
Glucose-Capillary: 113 mg/dL — ABNORMAL HIGH (ref 70–99)
Glucose-Capillary: 119 mg/dL — ABNORMAL HIGH (ref 70–99)
Glucose-Capillary: 119 mg/dL — ABNORMAL HIGH (ref 70–99)

## 2019-01-06 LAB — MAGNESIUM
Magnesium: 2.3 mg/dL (ref 1.7–2.4)
Magnesium: 2.2 mg/dL (ref 1.7–2.4)

## 2019-01-06 MED ORDER — TRAMADOL HCL 50 MG PO TABS: 50.0000 mg | ORAL_TABLET | ORAL | Status: DC | PRN

## 2019-01-06 MED ORDER — ENOXAPARIN SODIUM 40 MG/0.4ML ~~LOC~~ SOLN
40.0000 mg | Freq: Every day | SUBCUTANEOUS | Status: DC
  Administered 2019-01-06 – 2019-01-09 (×4): 40 mg via SUBCUTANEOUS
  Filled 2019-01-06 (×4): qty 0.4

## 2019-01-06 MED ORDER — METOCLOPRAMIDE HCL 5 MG/ML IJ SOLN
10.0000 mg | Freq: Four times a day (QID) | INTRAMUSCULAR | Status: AC
  Administered 2019-01-06 – 2019-01-07 (×4): 10 mg via INTRAVENOUS
  Filled 2019-01-06 (×4): qty 2

## 2019-01-06 MED ORDER — INSULIN ASPART 100 UNIT/ML ~~LOC~~ SOLN: 0.0000 [IU] | SUBCUTANEOUS | Status: DC

## 2019-01-06 MED ORDER — INSULIN ASPART 100 UNIT/ML ~~LOC~~ SOLN
0.0000 [IU] | Freq: Three times a day (TID) | SUBCUTANEOUS | Status: DC
  Administered 2019-01-06: 2 [IU] via SUBCUTANEOUS

## 2019-01-06 MED ORDER — ORAL CARE MOUTH RINSE
15.0000 mL | Freq: Two times a day (BID) | OROMUCOSAL | Status: DC
  Administered 2019-01-06 – 2019-01-09 (×4): 15 mL via OROMUCOSAL

## 2019-01-06 MED FILL — Thrombin (Recombinant) For Soln 20000 Unit: CUTANEOUS | Qty: 1 | Status: AC

## 2019-01-06 NOTE — Progress Notes (Addendum)
TCTS DAILY ICU PROGRESS NOTE                   [address]            [place] [address]          [phone]   1 Day Post-Op Procedure(s) (LRB): CORONARY ARTERY BYPASS GRAFTING (CABG), using right leg saphenous endoscopic and open vein harvest, exploration left leg (N/A) TRANSESOPHAGEAL ECHOCARDIOGRAM (TEE) (N/A)  Total Length of Stay:  LOS: 3 days   Subjective: Some sternotomy and leg incisional pain, mild sore throat  Objective: Vital signs in last 24 hours: Temp:  [95.5 F (35.3 C)-100.4 F (38 C)] 99.9 F (37.7 C) (03/17 0102) Pulse Rate:  [65-90] 89 (03/17 7253) Cardiac Rhythm: A-V Sequential paced (03/17 0400) Resp:  [9-19] 16 (03/17 0633) BP: (89-121)/(58-85) 115/70 (03/17 0633) SpO2:  [97 %-100 %] 99 % (03/17 6644) Arterial Line BP: (77-141)/(53-87) 135/67 (03/17 0633) FiO2 (%):  [40 %-50 %] 40 % (03/17 0000) Weight:  [88 kg] 88 kg (03/17 0500)  Filed Weights   12/31/18 1700 01/05/19 0440 01/06/19 0500  Weight: 83.6 kg 82.6 kg 88 kg    Weight change: 5.4 kg   Hemodynamic parameters for last 24 hours: PAP: (2-33)/(-9-18) 31/15 CO:  [2.6 L/min-4.2 L/min] 4.2 L/min CI:  [1.4 L/min/m2-2.2 L/min/m2] 2.2 L/min/m2  Intake/Output from previous day: 03/16 0701 - 03/17 0700 In: 5435.3 [I.V.:3786.8; Blood:525; IV Piggyback:1123.5] Out: 0347 [QQVZD:6387; Blood:900; Chest Tube:560]  Intake/Output this shift: No intake/output data recorded.  Current Meds: Scheduled Meds: . acetaminophen  1,000 mg Oral Q6H   Or  . acetaminophen (TYLENOL) oral liquid 160 mg/5 mL  1,000 mg Per Tube Q6H  . aspirin EC  325 mg Oral Daily   Or  . aspirin  324 mg Per Tube Daily  . atorvastatin  80 mg Oral q1800  . bisacodyl  10 mg Oral Daily   Or  . bisacodyl  10 mg Rectal Daily  . docusate sodium  200 mg Oral Daily  . famotidine (PEPCID) IV  20 mg Intravenous Q12H  . insulin aspart  0-24 Units Subcutaneous Q4H  . mouth rinse  15 mL Mouth Rinse BID  .  metoprolol tartrate  12.5 mg Oral BID   Or  . metoprolol tartrate  12.5 mg Per Tube BID  . mupirocin ointment  1 application Nasal BID  . [START ON 01/07/2019] pantoprazole  40 mg Oral Daily  . sodium chloride flush  3 mL Intravenous Q12H   Continuous Infusions: . sodium chloride 20 mL/hr at 01/06/19 0600  . sodium chloride    . sodium chloride 10 mL/hr at 01/05/19 1415  . albumin human 12.5 g (01/05/19 1608)  . cefUROXime (ZINACEF)  IV Stopped (01/05/19 2009)  . dexmedetomidine (PRECEDEX) IV infusion Stopped (01/05/19 1606)  . lactated ringers 20 mL/hr at 01/05/19 1415  . lactated ringers    . nitroGLYCERIN Stopped (01/05/19 1841)  . phenylephrine (NEO-SYNEPHRINE) Adult infusion 0 mcg/min (01/05/19 1415)   PRN Meds:.sodium chloride, albumin human, metoprolol tartrate, midazolam, morphine injection, ondansetron (ZOFRAN) IV, oxyCODONE, sodium chloride flush, traMADol  General appearance: alert, cooperative, fatigued and no distress Heart: regular rate and rhythm Lungs: clear anteriorly Abdomen: soft, nontender Extremities: no significant edema Wound: dressings CDI  Lab Results: CBC: Recent Labs    01/05/19 2030 01/05/19 2048 01/06/19 0411  WBC 13.7*  --  12.8*  HGB 10.8* 10.9* 10.8*  HCT 34.2* 32.0* 33.9*  PLT 130*  --  133*   BMET:  Recent Labs    01/05/19 2030 01/05/19 2048 01/06/19 0411  NA 136 137 135  K 4.9 4.9 4.6  CL 108  --  105  CO2 22  --  22  GLUCOSE 171*  --  139*  BUN 8  --  8  CREATININE 0.89  --  0.84  CALCIUM 7.5*  --  7.8*    CMET: Lab Results  Component Value Date   WBC 12.8 (H) 01/06/2019   HGB 10.8 (L) 01/06/2019   HCT 33.9 (L) 01/06/2019   PLT 133 (L) 01/06/2019   GLUCOSE 139 (H) 01/06/2019   CHOL 223 (H) 01/01/2019   TRIG 122 01/01/2019   HDL 33 (L) 01/01/2019   LDLCALC 166 (H) 01/01/2019   ALT 33 09/17/2018   AST 33 09/17/2018   NA 135 01/06/2019   K 4.6 01/06/2019   CL 105 01/06/2019   CREATININE 0.84 01/06/2019   BUN 8  01/06/2019   CO2 22 01/06/2019   TSH 1.32 05/16/2018   INR 1.5 (H) 01/05/2019   HGBA1C 5.8 (H) 01/04/2019      PT/INR:  Recent Labs    01/05/19 1428  LABPROT 17.9*  INR 1.5*   Radiology: Dg Chest Port 1 View  Result Date: 01/05/2019 CLINICAL DATA:  Status post CABG. EXAM: PORTABLE CHEST 1 VIEW COMPARISON:  12/31/2018. FINDINGS: Normal heart size, with LVH. Status post median sternotomy. BILATERAL chest tubes are present. Mediastinal tube is present. Swan-Ganz catheter is kinked in the main pulmonary artery, with the tip directed retrograde, and should be repositioned. ET tube 4 cm above carina. No pneumothorax. No consolidation or edema. IMPRESSION: Status post CABG. Swan-Ganz catheter kinked in the main pulmonary artery, and should be repositioned. These results will be called to the ordering clinician or representative by the Radiologist Assistant, and communication documented in the PACS or zVision Dashboard. Electronically Signed   By: Staci Righter M.D.   On: 01/05/2019 14:53     Assessment/Plan: S/P Procedure(s) (LRB): CORONARY ARTERY BYPASS GRAFTING (CABG), using right leg saphenous endoscopic and open vein harvest, exploration left leg (N/A) TRANSESOPHAGEAL ECHOCARDIOGRAM (TEE) (N/A)  1 hemodyn stable on no gtts, AVpacing at 90, sinus rhythm in 70's underneath, can probably remove soon 2 extubated , sats good on 4 liters  3 excellent UOP, renal fnxn normal, wt up about 5kg, will prob need diuresis but spontaneous output is currently good 4 170 cc CT drainage since MN- no air leak, d/c tubes 5 expected ABL anemia is stable- observe 6 minor leukocytosis, improving trend , probable systemic inflammatory response -monitor 7 minor thrombocytopenia- observe 8 BS well controlled, preop HgA1C 5.8- no preop meds 10 remove invasive lines 11 begin rehab per routine, also usual pulm toilet 12 see progression orders, poss to floor later    John Giovanni [facility] 01/06/2019 7:27 AM   Pager [phone]    Chart reviewed, patient examined, agree with above. She looks good. Rhythm is sinus 60's so will hold off on beta blocker for now. Start diuresis.

## 2019-01-06 NOTE — Progress Notes (Signed)
Patient hemodynamically stable.  Telemetry review shows atrial pacing 90 bpm.  Will follow as she progresses.  Management per surgical team.  Sherren Mocha 01/06/2019 10:21 AM

## 2019-01-06 NOTE — Progress Notes (Signed)
      [address]       [place] [address]             [phone]      POD # 1 CABG  Up in chair  BP 124/82 (BP Location: Right Arm)   Pulse 80   Temp 99.5 F (37.5 C) (Oral)   Resp 16   Ht 5\' 6"  (1.676 m)   Wt 88 kg   SpO2 94%   BMI 31.31 kg/m   I/O 325/ 435  K= 4.2, creatinine 0.96 Hct= 32  Doing well POD # 1   Steven C. Roxan Hockey, MD [facility] [phone]

## 2019-01-06 NOTE — Progress Notes (Signed)
Pt off BIPAP at this time and on nasal cannula.  Pt tolerating well.  RT will continue to monitor.

## 2019-01-07 ENCOUNTER — Inpatient Hospital Stay (HOSPITAL_COMMUNITY): Payer: Medicare Other

## 2019-01-07 LAB — BASIC METABOLIC PANEL
Anion gap: 7 (ref 5–15)
BUN: 9 mg/dL (ref 8–23)
CALCIUM: 8.4 mg/dL — AB (ref 8.9–10.3)
CO2: 25 mmol/L (ref 22–32)
Chloride: 102 mmol/L (ref 98–111)
Creatinine, Ser: 0.98 mg/dL (ref 0.44–1.00)
GFR calc Af Amer: >60 mL/min (ref >60)
GFR calc non Af Amer: 59 mL/min — ABNORMAL LOW (ref >60)
Glucose, Bld: 120 mg/dL — ABNORMAL HIGH (ref 70–99)
Potassium: 4 mmol/L (ref 3.5–5.1)
Sodium: 134 mmol/L — ABNORMAL LOW (ref 135–145)

## 2019-01-07 LAB — GLUCOSE, CAPILLARY
Glucose-Capillary: 132 mg/dL — ABNORMAL HIGH (ref 70–99)
Glucose-Capillary: 123 mg/dL — ABNORMAL HIGH (ref 70–99)
Glucose-Capillary: 117 mg/dL — ABNORMAL HIGH (ref 70–99)
Glucose-Capillary: 119 mg/dL — ABNORMAL HIGH (ref 70–99)

## 2019-01-07 LAB — CBC
HCT: 32.5 % — ABNORMAL LOW (ref 36.0–46.0)
Hemoglobin: 10.2 g/dL — ABNORMAL LOW (ref 12.0–15.0)
MCH: 29 pg (ref 26.0–34.0)
MCHC: 31.4 g/dL (ref 30.0–36.0)
MCV: 92.3 fL (ref 80.0–100.0)
PLATELETS: 130 10*3/uL — AB (ref 150–400)
RBC: 3.52 MIL/uL — ABNORMAL LOW (ref 3.87–5.11)
RDW: 13.7 % (ref 11.5–15.5)
WBC: 14.2 10*3/uL — ABNORMAL HIGH (ref 4.0–10.5)
nRBC: 0 % (ref 0.0–0.2)

## 2019-01-07 MED ORDER — SODIUM CHLORIDE 0.9 % IV SOLN: 250.0000 mL | INTRAVENOUS | Status: DC | PRN

## 2019-01-07 MED ORDER — MOVING RIGHT ALONG BOOK
Freq: Once | Status: AC
  Administered 2019-01-07: 12:00:00

## 2019-01-07 MED ORDER — ONDANSETRON HCL 4 MG/2ML IJ SOLN: 4.0000 mg | Freq: Four times a day (QID) | INTRAMUSCULAR | Status: DC | PRN

## 2019-01-07 MED ORDER — PANTOPRAZOLE SODIUM 40 MG PO TBEC
40.0000 mg | DELAYED_RELEASE_TABLET | Freq: Every day | ORAL | Status: DC
  Administered 2019-01-08 – 2019-01-10 (×3): 40 mg via ORAL
  Filled 2019-01-07 (×3): qty 1

## 2019-01-07 MED ORDER — BISACODYL 5 MG PO TBEC
10.0000 mg | DELAYED_RELEASE_TABLET | Freq: Every day | ORAL | Status: DC | PRN
  Administered 2019-01-09 – 2019-01-10 (×2): 10 mg via ORAL
  Filled 2019-01-07 (×2): qty 2

## 2019-01-07 MED ORDER — ASPIRIN EC 325 MG PO TBEC
325.0000 mg | DELAYED_RELEASE_TABLET | Freq: Every day | ORAL | Status: DC
  Administered 2019-01-08 – 2019-01-10 (×3): 325 mg via ORAL
  Filled 2019-01-07 (×3): qty 1

## 2019-01-07 MED ORDER — SODIUM CHLORIDE 0.9% FLUSH
3.0000 mL | Freq: Two times a day (BID) | INTRAVENOUS | Status: DC
  Administered 2019-01-07 – 2019-01-10 (×6): 3 mL via INTRAVENOUS

## 2019-01-07 MED ORDER — FUROSEMIDE 40 MG PO TABS
40.0000 mg | ORAL_TABLET | Freq: Every day | ORAL | Status: AC
  Administered 2019-01-07 – 2019-01-09 (×3): 40 mg via ORAL
  Filled 2019-01-07 (×3): qty 1

## 2019-01-07 MED ORDER — BISACODYL 10 MG RE SUPP: 10.0000 mg | Freq: Every day | RECTAL | Status: DC | PRN

## 2019-01-07 MED ORDER — POTASSIUM CHLORIDE CRYS ER 20 MEQ PO TBCR
20.0000 meq | EXTENDED_RELEASE_TABLET | Freq: Two times a day (BID) | ORAL | Status: DC
  Administered 2019-01-07 – 2019-01-09 (×5): 20 meq via ORAL
  Filled 2019-01-07 (×5): qty 1

## 2019-01-07 MED ORDER — TRAMADOL HCL 50 MG PO TABS: 50.0000 mg | ORAL_TABLET | Freq: Four times a day (QID) | ORAL | Status: DC | PRN

## 2019-01-07 MED ORDER — ACETAMINOPHEN 325 MG PO TABS
650.0000 mg | ORAL_TABLET | Freq: Four times a day (QID) | ORAL | Status: DC | PRN
  Administered 2019-01-07 – 2019-01-08 (×2): 650 mg via ORAL
  Filled 2019-01-07 (×2): qty 2

## 2019-01-07 MED ORDER — SENNOSIDES-DOCUSATE SODIUM 8.6-50 MG PO TABS
1.0000 | ORAL_TABLET | Freq: Two times a day (BID) | ORAL | Status: DC | PRN
  Administered 2019-01-09 – 2019-01-10 (×3): 1 via ORAL
  Filled 2019-01-07 (×3): qty 1

## 2019-01-07 MED ORDER — ONDANSETRON HCL 4 MG PO TABS: 4.0000 mg | ORAL_TABLET | Freq: Four times a day (QID) | ORAL | Status: DC | PRN

## 2019-01-07 MED ORDER — METOCLOPRAMIDE HCL 10 MG PO TABS
10.0000 mg | ORAL_TABLET | Freq: Three times a day (TID) | ORAL | Status: AC
  Administered 2019-01-07 – 2019-01-08 (×8): 10 mg via ORAL
  Filled 2019-01-07 (×8): qty 1

## 2019-01-07 MED ORDER — SODIUM CHLORIDE 0.9% FLUSH: 3.0000 mL | INTRAVENOUS | Status: DC | PRN

## 2019-01-07 MED ORDER — METOPROLOL TARTRATE 12.5 MG HALF TABLET
12.5000 mg | ORAL_TABLET | Freq: Two times a day (BID) | ORAL | Status: DC
  Administered 2019-01-07 – 2019-01-10 (×7): 12.5 mg via ORAL
  Filled 2019-01-07 (×7): qty 1

## 2019-01-07 MED FILL — Albumin, Human Inj 5%: INTRAVENOUS | Qty: 250 | Status: AC

## 2019-01-07 MED FILL — Electrolyte-R (PH 7.4) Solution: INTRAVENOUS | Qty: 4000 | Status: AC

## 2019-01-07 MED FILL — Magnesium Sulfate Inj 50%: INTRAMUSCULAR | Qty: 10 | Status: AC

## 2019-01-07 MED FILL — Potassium Chloride Inj 2 mEq/ML: INTRAVENOUS | Qty: 40 | Status: AC

## 2019-01-07 MED FILL — Sodium Bicarbonate IV Soln 8.4%: INTRAVENOUS | Qty: 50 | Status: AC

## 2019-01-07 MED FILL — Sodium Chloride IV Soln 0.9%: INTRAVENOUS | Qty: 3000 | Status: AC

## 2019-01-07 MED FILL — Heparin Sodium (Porcine) Inj 1000 Unit/ML: INTRAMUSCULAR | Qty: 30 | Status: AC

## 2019-01-07 MED FILL — Mannitol IV Soln 20%: INTRAVENOUS | Qty: 500 | Status: AC

## 2019-01-07 MED FILL — Lidocaine HCl(Cardiac) IV PF Soln Pref Syr 100 MG/5ML (2%): INTRAVENOUS | Qty: 5 | Status: AC

## 2019-01-07 MED FILL — Heparin Sodium (Porcine) Inj 1000 Unit/ML: INTRAMUSCULAR | Qty: 20 | Status: AC

## 2019-01-07 NOTE — Progress Notes (Signed)
2 Days Post-Op Procedure(s) (LRB): CORONARY ARTERY BYPASS GRAFTING (CABG), using right leg saphenous endoscopic and open vein harvest, exploration left leg (N/A) TRANSESOPHAGEAL ECHOCARDIOGRAM (TEE) (N/A) Subjective: No specific complaints. Nausea better. Only taking Tylenol for pain. Ambulated.  Objective: Vital signs in last 24 hours: Temp:  [98.4 F (36.9 C)-100.2 F (37.9 C)] 98.6 F (37 C) (03/18 0500) Pulse Rate:  [63-89] 72 (03/18 0500) Cardiac Rhythm: Atrial paced (03/18 0330) Resp:  [2-27] 25 (03/18 0500) BP: (103-136)/(60-82) 122/62 (03/18 0500) SpO2:  [88 %-100 %] 93 % (03/18 0500) Arterial Line BP: (83-87)/(62-79) 87/79 (03/17 0900) Weight:  [87 kg] 87 kg (03/18 0500)  Hemodynamic parameters for last 24 hours: PAP: (24-25)/(10-11) 25/11  Intake/Output from previous day: 03/17 0701 - 03/18 0700 In: 464.9 [P.O.:120; I.V.:244.9; IV Piggyback:100] Out: 1175 [Urine:1125; Chest Tube:50] Intake/Output this shift: No intake/output data recorded.  General appearance: alert and cooperative Neurologic: intact Heart: regular rate and rhythm, S1, S2 normal, no murmur, click, rub or gallop Lungs: clear to auscultation bilaterally Extremities: edema mild Wound: dressings dry  Lab Results: Recent Labs    01/06/19 1500 01/07/19 0342  WBC 12.5* 14.2*  HGB 10.0* 10.2*  HCT 31.7* 32.5*  PLT 128* 130*   BMET:  Recent Labs    01/06/19 1500 01/07/19 0342  NA 134* 134*  K 4.2 4.0  CL 105 102  CO2 23 25  GLUCOSE 147* 120*  BUN 8 9  CREATININE 0.96 0.98  CALCIUM 7.9* 8.4*    PT/INR:  Recent Labs    01/05/19 1428  LABPROT 17.9*  INR 1.5*   ABG    Component Value Date/Time   PHART 7.265 (L) 01/05/2019 2048   HCO3 22.6 01/05/2019 2048   TCO2 24 01/05/2019 2048   ACIDBASEDEF 4.0 (H) 01/05/2019 2048   O2SAT 98.0 01/05/2019 2048   CBG (last 3)  Recent Labs    01/06/19 1616 01/06/19 2115 01/07/19 0650  GLUCAP 123* 119* 117*   CXR: mild bibasilar  atelectasis  Assessment/Plan: S/P Procedure(s) (LRB): CORONARY ARTERY BYPASS GRAFTING (CABG), using right leg saphenous endoscopic and open vein harvest, exploration left leg (N/A) TRANSESOPHAGEAL ECHOCARDIOGRAM (TEE) (N/A)  POD 2 Hemodynamically stable in sinus rhythm. HR 70's so will start low dose Lopressor.  Mild volume excess: wt is 10 lbs over preop so will continue diuresis.  IS, ambulation  Glucose under control. Preop Hgb A1c 5.8 on no meds. Needs continued attention to diet and exercise.  Transfer to 4E   LOS: 4 days    Gaye Pollack 01/07/2019

## 2019-01-07 NOTE — Progress Notes (Signed)
CARDIAC REHAB PHASE I   PRE:  Rate/Rhythm: 78 SR  BP:  Sitting: 127/65      SaO2: 97 RA  Helped pt ambulate to BR. Pt with steady gait, standby assist. Offered to walk with pt. Pt declining at this time stating she walked twice already. IS to 635, encouraged continued use. Pt in recliners, call bell within reach. Will continue to follow.  [phone] Rufina Falco, RN BSN 01/07/2019 2:38 PM

## 2019-01-07 NOTE — Progress Notes (Signed)
Patient arrived from 2 heart to room [place] room 1. Patient vital signs obtained and monitor placed and CCMD made aware and verified. CHG bath completed. Patient with call bell within reach will monitor patient Jadie Allington, Bettina Gavia RN

## 2019-01-08 DIAGNOSIS — I2510 Atherosclerotic heart disease of native coronary artery without angina pectoris: Secondary | ICD-10-CM

## 2019-01-08 LAB — BPAM RBC
Blood Product Expiration Date: 202004072359
Blood Product Expiration Date: 202004072359
Blood Product Expiration Date: 202004092359
Blood Product Expiration Date: 202004092359
ISSUE DATE / TIME: 202003161122
ISSUE DATE / TIME: 202003161122
ISSUE DATE / TIME: 202003161150
ISSUE DATE / TIME: 202003161150
Unit Type and Rh: 7300
Unit Type and Rh: 7300
Unit Type and Rh: 7300
Unit Type and Rh: 7300

## 2019-01-08 LAB — TYPE AND SCREEN
ABO/RH(D): B POS
Antibody Screen: NEGATIVE
Unit division: 0
Unit division: 0
Unit division: 0
Unit division: 0

## 2019-01-08 NOTE — Progress Notes (Signed)
EPW's removed per MD order without difficulty.  Pt instructed on bedrest and BP checks for one hour.  Sternal dressing removed and incision painted with betadine.  Will continue to monitor.

## 2019-01-08 NOTE — Care Management Important Message (Signed)
Important Message  Patient Details  Name: Jessica [name] MRN: [id number] Date of Birth: 10-Jul-1951   Medicare Important Message Given:  Yes    Orbie Pyo 01/08/2019, 12:31 PM

## 2019-01-08 NOTE — Progress Notes (Addendum)
Pt ambulated around unit x 470 feet, pt tolerated well 

## 2019-01-08 NOTE — Progress Notes (Addendum)
[address]       [place], [address]             [phone]      3 Days Post-Op Procedure(s) (LRB): CORONARY ARTERY BYPASS GRAFTING (CABG), using right leg saphenous endoscopic and open vein harvest, exploration left leg (N/A) TRANSESOPHAGEAL ECHOCARDIOGRAM (TEE) (N/A) Subjective: Feels pretty well overall, ambulation improving, appetite returning, no SOB Moderate incisional pain  Objective: Vital signs in last 24 hours: Temp:  [98.9 F (37.2 C)-99.3 F (37.4 C)] 99.1 F (37.3 C) (03/19 0543) Pulse Rate:  [70-78] 78 (03/19 0543) Cardiac Rhythm: Normal sinus rhythm (03/19 0700) Resp:  [16-21] 16 (03/19 0543) BP: (101-134)/(58-74) 117/68 (03/19 0543) SpO2:  [92 %-96 %] 94 % (03/19 0543) FiO2 (%):  [40 %] 40 % (03/18 0800) Weight:  [84.1 kg] 84.1 kg (03/19 0543)  Hemodynamic parameters for last 24 hours:    Intake/Output from previous day: 03/18 0701 - 03/19 0700 In: 720 [P.O.:720] Out: -  Intake/Output this shift: No intake/output data recorded.  General appearance: alert, cooperative and no distress Heart: regular rate and rhythm Lungs: mildly dim in bases Abdomen: benign Extremities: no edema Wound: incis healing well  Lab Results: Recent Labs    01/06/19 1500 01/07/19 0342  WBC 12.5* 14.2*  HGB 10.0* 10.2*  HCT 31.7* 32.5*  PLT 128* 130*   BMET:  Recent Labs    01/06/19 1500 01/07/19 0342  NA 134* 134*  K 4.2 4.0  CL 105 102  CO2 23 25  GLUCOSE 147* 120*  BUN 8 9  CREATININE 0.96 0.98  CALCIUM 7.9* 8.4*    PT/INR:  Recent Labs    01/05/19 1428  LABPROT 17.9*  INR 1.5*   ABG    Component Value Date/Time   PHART 7.265 (L) 01/05/2019 2048   HCO3 22.6 01/05/2019 2048   TCO2 24 01/05/2019 2048   ACIDBASEDEF 4.0 (H) 01/05/2019 2048   O2SAT 98.0 01/05/2019 2048   CBG (last 3)  Recent Labs    01/06/19 2115 01/07/19 0650 01/07/19 1117  GLUCAP 119* 117* 119*    Meds Scheduled Meds: . aspirin EC  325 mg  Oral Daily  . atorvastatin  80 mg Oral q1800  . enoxaparin (LOVENOX) injection  40 mg Subcutaneous QHS  . furosemide  40 mg Oral Daily  . mouth rinse  15 mL Mouth Rinse BID  . metoCLOPramide  10 mg Oral TID AC & HS  . metoprolol tartrate  12.5 mg Oral BID  . mupirocin ointment  1 application Nasal BID  . pantoprazole  40 mg Oral QAC breakfast  . potassium chloride  20 mEq Oral BID  . sodium chloride flush  3 mL Intravenous Q12H   Continuous Infusions: . sodium chloride     PRN Meds:.sodium chloride, acetaminophen, bisacodyl **OR** bisacodyl, ondansetron **OR** ondansetron (ZOFRAN) IV, senna-docusate, sodium chloride flush, traMADol  Xrays Dg Chest Port 1 View  Result Date: 01/07/2019 CLINICAL DATA:  Chest pain EXAM: PORTABLE CHEST 1 VIEW COMPARISON:  January 06, 2019 FINDINGS: Bilateral chest tubes and mediastinal drain have been removed. Swan-Ganz catheter has been removed. Cordis tip is in the superior vena cava. Temporary pacemaker wires remain attached to the right heart. No pneumothorax. There is bibasilar atelectasis with small left pleural effusion. Lungs elsewhere are clear. Heart is mildly enlarged with pulmonary vascularity normal. No adenopathy. Patient is status post coronary artery bypass grafting. There is aortic atherosclerosis. IMPRESSION: No pneumothorax. Bibasilar atelectasis. Stable  cardiac silhouette. Cordis tip in superior vena cava. Temporary pacemaker wires remain attached to the right heart. Aortic Atherosclerosis (ICD10-I70.0). Electronically Signed   By: Lowella Grip III M.D.   On: 01/07/2019 07:33    Assessment/Plan: S/P Procedure(s) (LRB): CORONARY ARTERY BYPASS GRAFTING (CABG), using right leg saphenous endoscopic and open vein harvest, exploration left leg (N/A) TRANSESOPHAGEAL ECHOCARDIOGRAM (TEE) (N/A)  1 overall doing well POD#3 2 hemodyn stable in sinus rhythm 3 Tmax 99.3, will check CBC in am as WBC trend was increasing, prob just systemic inflam  response 4 routine pulm toilet/cardiac rehab 5 does not appear volume overloaded, Wt trending lower , cont to follow 6 no new labs, CBG - good control of blood sugars   LOS: 5 days    [facility] [employer] 01/08/2019 Pager [phone]   Chart reviewed, patient examined, agree with above. She is progressing well. Ambulating in hall. No BM yet. Probably be ready to go home Saturday.

## 2019-01-08 NOTE — Discharge Summary (Signed)
Physician Discharge Summary  Patient ID: Jessica [name] MRN: [id number] DOB/AGE: 1950-11-15 68 y.o.  Admit date: 12/31/2018 Discharge date: 01/10/2019  Admission Diagnoses: Unstable angina  Discharge Diagnoses:  Active Problems:   Unstable angina (HCC)   S/P CABG x 5  Patient Active Problem List   Diagnosis Date Noted  . S/P CABG x 5 01/05/2019  . Stable angina pectoris ([facility]) 12/31/2018  . Unstable angina ([facility]) 12/31/2018  . Pain in left shin 09/30/2018  . Degenerative joint disease of low back 01/09/2017  . Routine general medical examination at a health care facility 10/02/2016  . GERD (gastroesophageal reflux disease) 07/28/2016  . H/O malignant neoplasm of breast 06/19/2016   History of present illness: The patient is a 68 year old female with a past medical history significant for hyperlipidemia, diabetes mellitus, GERD, and right breast cancer (status post radiation at a lump ectomy on right) who began experiencing exertional increasing shortness of breath and fatigue about 1 month ago.  More recently, last week, she began experiencing chest tightness with activities right arm pain.  She denied nausea, diaphoresis or syncope.  Notably, she was recently diagnosed with hypertension but discontinued the medication due to dizziness and the sense that her blood pressure issues had resolved.  He then presented to her primary care medical doctor and EKG showed sinus bradycardia.  He felt as though her symptoms were concerning for unstable angina and she was brought to the emergency department for further evaluation.  Troponin I's were negative.  An echocardiogram done 12/31/2018 showed LVEF of 60 to 65% and no significant valvular disease.  She was felt to require cardiac catheterization which was done and this revealed three-vessel coronary artery disease with ejection fraction of 55 to 65%.   Discharged Condition: good  Hospital Course: Patient was admitted after presentation to the  emergency department as above noted to be seen by cardiology who undertook a full evaluation where she was diagnosed by cardiac catheterization with three-vessel coronary artery disease.  Cardiothoracic surgical consultation was obtained with Gilford Raid, MD who evaluated the patient and studies and agree with recommendations to proceed with coronary artery surgical revascularization.  She was scheduled for the procedure and on 01/05/2019 she underwent the below described procedure.  She tolerated it well and was taken to the surgical intensive care unit in stable condition.  Postoperative hospital course: The patient is done quite well.  She has maintained stable hemodynamics.  She was weaned from the ventilator without difficulties using standard protocols.  She has mild volume overload but is responding well to diuretics which will be continued for a short-term as outpatient.  She has a mild acute blood loss anemia which is stable.  She had a mild leukocytosis consistent with acute inflammation which is trending better over time.  She has no fevers.  She shows no evidence of infection.  Her incisions are all healing well.  She is tolerating gradually increasing activities using standard protocols.  All routine lines, monitors and drainage devices have been discontinued in the standard fashion.  Oxygen has been weaned and she maintains good saturations on room air.  At the time of discharge the patient is felt to be quite stable.   Consults: cardiology  Significant Diagnostic Studies: angiography: Cardiac catheterization  Treatments: surgery:  CARDIOVASCULAR SURGERY OPERATIVE NOTE  01/05/2019  Surgeon:  Gaye Pollack, MD  First Assistant: Jadene Pierini, PA-C   Preoperative Diagnosis:  Severe multi-vessel coronary artery disease   Postoperative Diagnosis:  Same   Procedure:  1. Median  Sternotomy 2. Extracorporeal circulation 3.   Coronary artery bypass grafting x 5   Left internal mammary graft to the LAD  SVG to diagonal  Sequential SVG to OM1 and OM2  SVG to RCA 4.   Open vein harvest from the right leg   Anesthesia:  General Endotracheal  Discharge Exam: Blood pressure 109/71, pulse 79, temperature 98.7 F (37.1 C), temperature source Oral, resp. rate (!) 23, height 5\' 6"  (1.676 m), weight 82.2 kg, SpO2 95 %.  General appearance: alert, cooperative and no distress Heart: regular rate and rhythm Lungs: clear to auscultation bilaterally Abdomen: benign Extremities: minor right leg edema Wound: incis healing well, some thigh echymosis   Disposition: Discharge disposition: 01-Home or Self Care       Discharge Instructions    Amb Referral to Cardiac Rehabilitation   Complete by:  As directed    Diagnosis:  CABG   CABG X ___:  5   Discharge patient   Complete by:  As directed    Discharge disposition:  01-Home or Self Care   Discharge patient date:  01/10/2019     Allergies as of 01/10/2019      Reactions   Codeine    Sulfa Antibiotics    Sulfacetamide    Other reaction(s): Other (See Comments) swelling      Medication List    STOP taking these medications   simvastatin 20 MG tablet Commonly known as:  ZOCOR     TAKE these medications   aspirin 325 MG EC tablet Take 1 tablet (325 mg total) by mouth daily.   atorvastatin 80 MG tablet Commonly known as:  LIPITOR Take 1 tablet (80 mg total) by mouth daily at 6 PM.   DIGESTIVE ENZYME PO Take 1 capsule by mouth 2 (two) times daily.   furosemide 20 MG tablet Commonly known as:  LASIX Take 1 tablet (20 mg total) by mouth daily for 4 days.   loratadine 10 MG tablet Commonly known as:  CLARITIN Take 10 mg by mouth daily.   metoprolol tartrate 25 MG tablet Commonly known as:  LOPRESSOR Take 0.5 tablets (12.5 mg total) by mouth 2 (two) times daily.   MULTI VITAMIN DAILY  PO Take 1 tablet by mouth daily.   potassium chloride SA 20 MEQ tablet Commonly known as:  K-DUR,KLOR-CON Take 1 tablet (20 mEq total) by mouth daily for 4 days.   traMADol 50 MG tablet Commonly known as:  ULTRAM Take 1 tablet (50 mg total) by mouth every 6 (six) hours as needed for up to 7 days for moderate pain.   Vitamin D (Cholecalciferol) 25 MCG (1000 UT) Caps Take 1,000 Units by mouth 2 (two) times daily.      Follow-up Information    Gaye Pollack, MD Follow up.   Specialty:  Cardiothoracic Surgery Why:  Please see discharge paperwork for details of appointment. Contact information: [address] [place] [address] [phone]        Lorretta Harp, MD Follow up.   Specialties:  Cardiology, Radiology Why:  Please see discharge paperwork for follow-up appointment with cardiology Contact information: [address] [place] [address] [phone]         The patient  has been discharged on:   1.Beta Blocker:  Yes [  y ]                              No   [   ]                              If No, reason:  2.Ace Inhibitor/ARB: Yes [   ]                                     No  [n    ]                                     If No, reason:low bp  3.Statin:   Yes [   y]                  No  [   ]                  If No, reason:  4.Ecasa:  Yes  Blue.Reese   ]                  No   [   ]                  If No, reason:  Signed: Wilder [place] 01/10/2019, 7:51 AM

## 2019-01-08 NOTE — Progress Notes (Signed)
CARDIAC REHAB PHASE I   PRE:  Rate/Rhythm: 83 SR  BP:  Sitting: 120/      SaO2: 97 RA  MODE:  Ambulation: 470 ft   POST:  Rate/Rhythm: 95 SR  BP:  Sitting: 140/82     SaO2: 98 RA   Pt ambulated 469ft in hallway standby assist with front wheel walker. Pt helped to BR and then back to bed. Call bell and phone within reach. Encouraged pt to walk for a third time later today. Also encouraged continued IS use. Will continue to follow.  [phone] Rufina Falco, RN BSN 01/08/2019 10:53 AM

## 2019-01-09 LAB — CBC
HCT: 30.5 % — ABNORMAL LOW (ref 36.0–46.0)
Hemoglobin: 9.6 g/dL — ABNORMAL LOW (ref 12.0–15.0)
MCH: 28.5 pg (ref 26.0–34.0)
MCHC: 31.5 g/dL (ref 30.0–36.0)
MCV: 90.5 fL (ref 80.0–100.0)
Platelets: 182 10*3/uL (ref 150–400)
RBC: 3.37 MIL/uL — ABNORMAL LOW (ref 3.87–5.11)
RDW: 13.7 % (ref 11.5–15.5)
WBC: 12.5 10*3/uL — ABNORMAL HIGH (ref 4.0–10.5)
nRBC: 0 % (ref 0.0–0.2)

## 2019-01-09 MED ORDER — POTASSIUM CHLORIDE CRYS ER 20 MEQ PO TBCR
20.0000 meq | EXTENDED_RELEASE_TABLET | Freq: Every day | ORAL | Status: DC
  Administered 2019-01-09 – 2019-01-10 (×2): 20 meq via ORAL
  Filled 2019-01-09 (×2): qty 1

## 2019-01-09 MED ORDER — FUROSEMIDE 20 MG PO TABS
20.0000 mg | ORAL_TABLET | Freq: Every day | ORAL | Status: DC
  Administered 2019-01-09 – 2019-01-10 (×2): 20 mg via ORAL
  Filled 2019-01-09 (×3): qty 1

## 2019-01-09 NOTE — Progress Notes (Signed)
CARDIAC REHAB PHASE I   PRE:  Rate/Rhythm: 94 SR  BP:  Supine:   Sitting: 107/71  Standing:    SaO2: 91%RA  MODE:  Ambulation: 790 ft   POST:  Rate/Rhythm: 93 SR  BP:  Supine:   Sitting: 120/75  Standing:    SaO2: 95%RA [id number] Pt wanted to use walker once more. Will walk with staff or her husband later without walker. . Was steady walking around bed to chair without walker.Jama Flavors 790 ft on RA with walker independently. Tolerated well. Wants husband here for ed so will not ed at this time. Discussed CRP 2 and referred to [facility].    Graylon Good, RN BSN  01/09/2019 9:17 AM

## 2019-01-09 NOTE — Progress Notes (Addendum)
Progress Note  Patient Name: Jessica [name] Date of Encounter: 01/09/2019  Primary Cardiologist: No primary care provider on file.   Subjective   Feeling well this morning.   Inpatient Medications    Scheduled Meds: . aspirin EC  325 mg Oral Daily  . atorvastatin  80 mg Oral q1800  . enoxaparin (LOVENOX) injection  40 mg Subcutaneous QHS  . mouth rinse  15 mL Mouth Rinse BID  . metoprolol tartrate  12.5 mg Oral BID  . pantoprazole  40 mg Oral QAC breakfast  . potassium chloride  20 mEq Oral BID  . sodium chloride flush  3 mL Intravenous Q12H   Continuous Infusions: . sodium chloride     PRN Meds: sodium chloride, acetaminophen, bisacodyl **OR** bisacodyl, ondansetron **OR** ondansetron (ZOFRAN) IV, senna-docusate, sodium chloride flush, traMADol   Vital Signs    Vitals:   01/09/19 0317 01/09/19 0330 01/09/19 0809 01/09/19 0828  BP: (!) 117/59  107/71 107/71  Pulse: 77  92 98  Resp: 18 18 (!) 24   Temp: 98.6 F (37 C)  99.1 F (37.3 C)   TempSrc: Oral  Oral   SpO2: 94%  98%   Weight:  83.6 kg    Height:        Intake/Output Summary (Last 24 hours) at 01/09/2019 0924 Last data filed at 01/09/2019 0811 Gross per 24 hour  Intake 510 ml  Output 2 ml  Net 508 ml   Last 3 Weights 01/09/2019 01/08/2019 01/07/2019  Weight (lbs) 184 lb 4.9 oz 185 lb 6.4 oz 191 lb 12.8 oz  Weight (kg) 83.6 kg 84.097 kg 87 kg      Telemetry    SR - Personally Reviewed  ECG    N/a - Personally Reviewed  Physical Exam   GEN: No acute distress.   Neck: No JVD Cardiac: RRR, no murmurs, rubs, or gallops. Stable sternotomy site. Respiratory: Clear to auscultation bilaterally. GI: Soft, nontender, non-distended  MS: No edema; No deformity. Neuro:  Nonfocal  Psych: Normal affect   Labs    Chemistry Recent Labs  Lab 01/06/19 0411 01/06/19 1500 01/07/19 0342  NA 135 134* 134*  K 4.6 4.2 4.0  CL 105 105 102  CO2 22 23 25   GLUCOSE 139* 147* 120*  BUN 8 8 9   CREATININE  0.84 0.96 0.98  CALCIUM 7.8* 7.9* 8.4*  GFRNONAA >60 >60 59*  GFRAA >60 >60 >60  ANIONGAP 8 6 7      Hematology Recent Labs  Lab 01/06/19 1500 01/07/19 0342 01/09/19 0308  WBC 12.5* 14.2* 12.5*  RBC 3.41* 3.52* 3.37*  HGB 10.0* 10.2* 9.6*  HCT 31.7* 32.5* 30.5*  MCV 93.0 92.3 90.5  MCH 29.3 29.0 28.5  MCHC 31.5 31.4 31.5  RDW 13.7 13.7 13.7  PLT 128* 130* 182    Cardiac EnzymesNo results for input(s): TROPONINI in the last 168 hours. No results for input(s): TROPIPOC in the last 168 hours.   BNPNo results for input(s): BNP, PROBNP in the last 168 hours.   DDimer No results for input(s): DDIMER in the last 168 hours.   Radiology    No results found.  Cardiac Studies   Cath: 01/01/2019   Mid LAD lesion is 75% stenosed.  Prox LAD lesion is 40% stenosed.  Ost 1st Diag lesion is 95% stenosed.  Prox Cx lesion is 95% stenosed.  Dist RCA lesion is 80% stenosed.  The left ventricular systolic function is normal.  LV end diastolic pressure is  normal.  The left ventricular ejection fraction is 55-65% by visual estimate.  There is no aortic valve stenosis.   Multivessel CAD including severe disease at the mid LAD, proximal diagonal, proximal circumflex and RCA at the bifurcation of the PDA and PLA.  Plan for surgery consult.     Patient Profile     68 y.o. female with PMH of HLD, prediabetes and R breast CA s/p radiation and R lumpectomy presented with worsening exertional chest pain.  A 2D echo was normal.  Cardiac catheterization revealed three-vessel disease. Underwent CABG.   Assessment & Plan    1. CAD: Underwent 5v CABG on 3/16 now progressed from the unit to telemetry.  -- medical therapy with ASA, BB, statin  2. HTN: stable with current therapy  3. HL: on high dose statin  CARDIOLOGY RECOMMENDATIONS:  Discharge is anticipated in the next 48 hours. Recommendations for medications and follow up:  Discharge Medications: Continue medications as  they are currently listed in the [facility]. Exceptions to the above:  Noted above  Follow Up: The patient's Primary Cardiologist is No primary care provider on file.   Follow up in the office in 2 week(s).  For questions or updates, please contact [place] Please consult [email or web address] for contact info under   Signed, Reino Bellis, NP  01/09/2019, 9:24 AM    Patient seen, examined. Available data reviewed. Agree with findings, assessment, and plan as outlined by Reino Bellis, NP.  The patient feels well today.  She is eager to go home tomorrow.  She denies chest pain or shortness of breath.  On my exam, JVP is normal, lung fields are clear, heart is regular rate and rhythm with no murmur or gallop, chest exam reveals a well-healing midline sternotomy incision.  Extremities have trace edema.  The patient's medications are reviewed and she is on appropriate medical therapy with aspirin, a beta-blocker, and a statin drug.  Cardiology follow-up will be arranged with anticipated hospital discharge tomorrow.  Sherren Mocha, M.D. 01/09/2019 12:05 PM

## 2019-01-09 NOTE — Progress Notes (Addendum)
[address]       ,[place] [address]             [phone]      4 Days Post-Op Procedure(s) (LRB): CORONARY ARTERY BYPASS GRAFTING (CABG), using right leg saphenous endoscopic and open vein harvest, exploration left leg (N/A) TRANSESOPHAGEAL ECHOCARDIOGRAM (TEE) (N/A) Subjective: Feels well, no new c/o  Objective: Vital signs in last 24 hours: Temp:  [98.2 F (36.8 C)-98.8 F (37.1 C)] 98.6 F (37 C) (03/20 0317) Pulse Rate:  [74-90] 77 (03/20 0317) Cardiac Rhythm: Normal sinus rhythm (03/19 1900) Resp:  [18-21] 18 (03/20 0330) BP: (116-123)/(59-77) 117/59 (03/20 0317) SpO2:  [94 %-100 %] 94 % (03/20 0317) Weight:  [83.6 kg] 83.6 kg (03/20 0330)  Hemodynamic parameters for last 24 hours:    Intake/Output from previous day: 03/19 0701 - 03/20 0700 In: 420 [P.O.:420] Out: 3 [Urine:3] Intake/Output this shift: No intake/output data recorded.  General appearance: alert, cooperative and no distress Heart: regular rate and rhythm Lungs: min dim in bases Abdomen: benign Extremities: no edema Wound: incis healing well without signs of infection  Lab Results: Recent Labs    01/07/19 0342 01/09/19 0308  WBC 14.2* 12.5*  HGB 10.2* 9.6*  HCT 32.5* 30.5*  PLT 130* 182   BMET:  Recent Labs    01/06/19 1500 01/07/19 0342  NA 134* 134*  K 4.2 4.0  CL 105 102  CO2 23 25  GLUCOSE 147* 120*  BUN 8 9  CREATININE 0.96 0.98  CALCIUM 7.9* 8.4*    PT/INR: No results for input(s): LABPROT, INR in the last 72 hours. ABG    Component Value Date/Time   PHART 7.265 (L) 01/05/2019 2048   HCO3 22.6 01/05/2019 2048   TCO2 24 01/05/2019 2048   ACIDBASEDEF 4.0 (H) 01/05/2019 2048   O2SAT 98.0 01/05/2019 2048   CBG (last 3)  Recent Labs    01/06/19 2115 01/07/19 0650 01/07/19 1117  GLUCAP 119* 117* 119*    Meds Scheduled Meds: . aspirin EC  325 mg Oral Daily  . atorvastatin  80 mg Oral q1800  . enoxaparin (LOVENOX) injection  40 mg  Subcutaneous QHS  . furosemide  40 mg Oral Daily  . mouth rinse  15 mL Mouth Rinse BID  . metoprolol tartrate  12.5 mg Oral BID  . pantoprazole  40 mg Oral QAC breakfast  . potassium chloride  20 mEq Oral BID  . sodium chloride flush  3 mL Intravenous Q12H   Continuous Infusions: . sodium chloride     PRN Meds:.sodium chloride, acetaminophen, bisacodyl **OR** bisacodyl, ondansetron **OR** ondansetron (ZOFRAN) IV, senna-docusate, sodium chloride flush, traMADol  Xrays No results found.  Assessment/Plan: S/P Procedure(s) (LRB): CORONARY ARTERY BYPASS GRAFTING (CABG), using right leg saphenous endoscopic and open vein harvest, exploration left leg (N/A) TRANSESOPHAGEAL ECHOCARDIOGRAM (TEE) (N/A)  1 hemodyn stable in sinus rhythm, BP runs a bit low to initiate ACE-I/ARB at this time 2 leukocytosis trend improving- minor elevation at 12.5. Afebrile 3 expected acute blood loss anemia pretty stable, thrombocytopenia resolved 4 cont routine pulm toilet/rehab 5 home in am if no new issues  LOS: 6 days    John Giovanni [facility] 01/09/2019 Pager [phone]    Chart reviewed, patient examined, agree with above. She looks good and has been very stable. She has some swelling in right leg that is probably due to vein harvest. Wt is 2 lbs over preop. Will put her on  low dose lasix for a few more days. Plan home tomorrow.

## 2019-01-10 ENCOUNTER — Other Ambulatory Visit: Payer: Self-pay | Admitting: Surgical

## 2019-01-10 MED ORDER — ATORVASTATIN CALCIUM 80 MG PO TABS: 80.0000 mg | ORAL_TABLET | Freq: Every day | ORAL | 1 refills | Status: DC

## 2019-01-10 MED ORDER — FUROSEMIDE 20 MG PO TABS: 20.0000 mg | ORAL_TABLET | Freq: Every day | ORAL | 0 refills | Status: DC

## 2019-01-10 MED ORDER — ASPIRIN 325 MG PO TBEC: 325.0000 mg | DELAYED_RELEASE_TABLET | Freq: Every day | ORAL | 0 refills | Status: DC

## 2019-01-10 MED ORDER — TRAMADOL HCL 50 MG PO TABS: 50.0000 mg | ORAL_TABLET | Freq: Four times a day (QID) | ORAL | 0 refills | Status: AC | PRN

## 2019-01-10 MED ORDER — POTASSIUM CHLORIDE CRYS ER 20 MEQ PO TBCR: 20.0000 meq | EXTENDED_RELEASE_TABLET | Freq: Every day | ORAL | 0 refills | Status: DC

## 2019-01-10 MED ORDER — METOPROLOL TARTRATE 25 MG PO TABS: 12.5000 mg | ORAL_TABLET | Freq: Two times a day (BID) | ORAL | 1 refills | Status: DC

## 2019-01-10 NOTE — Progress Notes (Signed)
CARDIAC REHAB PHASE I   D/c education completed with pt and husband. Pt instructed to shower and monitor incisions daily. Pt c/o thigh incision feeling tight and rubbing, pt instructed to cover to protect it from doing do. Encouraged continued IS use, walks and sternal precautions. Pt given in-the-tube sheet, and heart healthy diet. Reviewed restrictions and exercise guidelines. Will refer to [facility] [place]. Pt aware of programs current closure, but they will follow-up once open again.  [id number] Rufina Falco, RN BSN 01/10/2019 10:08 AM

## 2019-01-10 NOTE — Progress Notes (Signed)
      [address]       [place] [address]             [phone]      5 Days Post-Op Procedure(s) (LRB): CORONARY ARTERY BYPASS GRAFTING (CABG), using right leg saphenous endoscopic and open vein harvest, exploration left leg (N/A) TRANSESOPHAGEAL ECHOCARDIOGRAM (TEE) (N/A) Subjective: Feels fine  Objective: Vital signs in last 24 hours: Temp:  [98.5 F (36.9 C)-99.2 F (37.3 C)] 98.7 F (37.1 C) (03/21 0420) Pulse Rate:  [78-98] 79 (03/21 0420) Cardiac Rhythm: Normal sinus rhythm (03/21 0017) Resp:  [14-24] 23 (03/21 0425) BP: (105-119)/(71-78) 109/71 (03/21 0420) SpO2:  [95 %-100 %] 95 % (03/21 0420) Weight:  [82.2 kg] 82.2 kg (03/21 0425)  Hemodynamic parameters for last 24 hours:    Intake/Output from previous day: 03/20 0701 - 03/21 0700 In: 760 [P.O.:760] Out: -  Intake/Output this shift: No intake/output data recorded.  General appearance: alert, cooperative and no distress Heart: regular rate and rhythm Lungs: clear to auscultation bilaterally Abdomen: benign Extremities: minor right leg edema Wound: incis healing well, some thigh echymosis  Lab Results: Recent Labs    01/09/19 0308  WBC 12.5*  HGB 9.6*  HCT 30.5*  PLT 182   BMET: No results for input(s): NA, K, CL, CO2, GLUCOSE, BUN, CREATININE, CALCIUM in the last 72 hours.  PT/INR: No results for input(s): LABPROT, INR in the last 72 hours. ABG    Component Value Date/Time   PHART 7.265 (L) 01/05/2019 2048   HCO3 22.6 01/05/2019 2048   TCO2 24 01/05/2019 2048   ACIDBASEDEF 4.0 (H) 01/05/2019 2048   O2SAT 98.0 01/05/2019 2048   CBG (last 3)  Recent Labs    01/07/19 1117  GLUCAP 119*    Meds Scheduled Meds: . aspirin EC  325 mg Oral Daily  . atorvastatin  80 mg Oral q1800  . enoxaparin (LOVENOX) injection  40 mg Subcutaneous QHS  . furosemide  20 mg Oral Daily  . mouth rinse  15 mL Mouth Rinse BID  . metoprolol tartrate  12.5 mg Oral BID  . pantoprazole  40 mg  Oral QAC breakfast  . potassium chloride  20 mEq Oral Daily  . sodium chloride flush  3 mL Intravenous Q12H   Continuous Infusions: . sodium chloride     PRN Meds:.sodium chloride, acetaminophen, bisacodyl **OR** bisacodyl, ondansetron **OR** ondansetron (ZOFRAN) IV, senna-docusate, sodium chloride flush, traMADol  Xrays No results found.  Assessment/Plan: S/P Procedure(s) (LRB): CORONARY ARTERY BYPASS GRAFTING (CABG), using right leg saphenous endoscopic and open vein harvest, exploration left leg (N/A) TRANSESOPHAGEAL ECHOCARDIOGRAM (TEE) (N/A)  1 hemodyn stable in sinus rhythm 2 sats good on RA 3 no new labs 4 no significant volume overload 5 stable for discharge   LOS: 7 days    John Giovanni [facility] 01/10/2019 Pager [phone]

## 2019-01-10 NOTE — Progress Notes (Signed)
Discharge AVS meds take and those due reviewed with pt. Follow up appointments and when to call MD reviewed. All questions and concerns addressed. No further questions at this time. D/c IV and TELE, CCMD notified. D/C home per orders.Pt brought down via wheelchair with family and RN.

## 2019-01-12 ENCOUNTER — Telehealth: Payer: Self-pay

## 2019-01-12 NOTE — Telephone Encounter (Signed)
Pt is on tcm list for unstable angina. Pt underwent cardiac catheterization and to follow up with cardiology.

## 2019-01-15 ENCOUNTER — Other Ambulatory Visit: Payer: Self-pay

## 2019-01-20 ENCOUNTER — Other Ambulatory Visit: Payer: Self-pay

## 2019-01-21 ENCOUNTER — Telehealth: Payer: Self-pay | Admitting: *Deleted

## 2019-01-21 NOTE — Telephone Encounter (Addendum)
Mrs. Jessica [name] is s/p CABG 01/04/19 witih discharge on 01/10/19.  She was scheduled for suture removal on the 27th but canceled due to being "sick".  She was scheduled again today but canceled.  She called earlier this week with complaints of her thumb hurting but thought it might be  Arthritis, no injury had occurred.  We said we would discuss at her appointment today.  Later today she and her husband called with concerns of a lo grade temp that she had last week and it has returned....99.3 to 100.3 and nausea x 2 days. It was so bad this morning that she had to cancel her appointment with [place]. I discussed routine follow up questions concerning her incisions, urinary symptoms, bowels, diet and medications.  She is not on any new meds except for metoprolol and Tramadol.  She has been taking the Tramadol sparingly since arriving home.  I discussed the issue with Dr. Cyndia Bent.  Since her incisions are not causing any problems, we recommended she contact her PCP and they both agreed.

## 2019-01-29 ENCOUNTER — Other Ambulatory Visit: Payer: Self-pay | Admitting: Surgery

## 2019-01-29 DIAGNOSIS — Z951 Presence of aortocoronary bypass graft: Secondary | ICD-10-CM

## 2019-01-30 ENCOUNTER — Telehealth: Payer: Self-pay

## 2019-01-30 NOTE — Telephone Encounter (Signed)
Virtual Visit Pre-Appointment Phone Call  Steps For Call: [place] PHONE   1. Confirm consent - "In the setting of the current Covid19 crisis, you are scheduled for a (phone or video) visit with your provider on (date) at (time).  Just as we do with many in-office visits, in order for you to participate in this visit, we must obtain consent.  If you'd like, I can send this to your mychart (if signed up) or email for you to review.  Otherwise, I can obtain your verbal consent now.  All virtual visits are billed to your insurance company just like a normal visit would be.  By agreeing to a virtual visit, we'd like you to understand that the technology does not allow for your provider to perform an examination, and thus may limit your provider's ability to fully assess your condition.  Finally, though the technology is pretty good, we cannot assure that it will always work on either your or our end, and in the setting of a video visit, we may have to convert it to a phone-only visit.  In either situation, we cannot ensure that we have a secure connection.  Are you willing to proceed?"  2. Give patient instructions for WebEx download to smartphone as below if video visit  3. Advise patient to be prepared with any vital sign or heart rhythm information, their current medicines, and a piece of paper and pen handy for any instructions they may receive the day of their visit  4. Inform patient they will receive a phone call 15 minutes prior to their appointment time (may be from unknown caller ID) so they should be prepared to answer  5. Confirm that appointment type is correct in Epic appointment notes (video vs telephone)    TELEPHONE CALL NOTE  Jessica [name] has been deemed a candidate for a follow-up tele-health visit to limit community exposure during the Covid-19 pandemic. I spoke with the patient via phone to ensure availability of phone/video source, confirm preferred email & phone  number, and discuss instructions and expectations.  I reminded Jessica [name] to be prepared with any vital sign and/or heart rhythm information that could potentially be obtained via home monitoring, at the time of her visit. I reminded Jessica [name] to expect a phone call at the time of her visit if her visit.  Did the patient verbally acknowledge consent to treatment?  [place] 01/30/2019 10:32 AM   DOWNLOADING THE [place]  - If Apple, go to [employer] and type in WebEx in the search bar. [place] [employer], the blue/Rafaela Dinius circle. The app is free but as with any other app downloads, their phone may require them to verify saved payment information or Apple password. The patient does NOT have to create an account.  - If Android, ask patient to go to [employer] and type in WebEx in the search bar. [place] [employer], the blue/Ariann Khaimov circle. The app is free but as with any other app downloads, their phone may require them to verify saved payment information or Android password. The patient does NOT have to create an account.   CONSENT FOR TELE-HEALTH VISIT - PLEASE REVIEW  I hereby voluntarily request, consent and authorize [facility] and its employed or contracted physicians, physician assistants, nurse practitioners or other licensed health care professionals (the Practitioner), to provide me with telemedicine health care services (the "Services") as deemed necessary  by the treating Practitioner. I acknowledge and consent to receive the Services by the Practitioner via telemedicine. I understand that the telemedicine visit will involve communicating with the Practitioner through live audiovisual communication technology and the disclosure of certain medical information by electronic transmission. I acknowledge that I have been given the opportunity to request an in-person assessment or other available alternative prior to the  telemedicine visit and am voluntarily participating in the telemedicine visit.  I understand that I have the right to withhold or withdraw my consent to the use of telemedicine in the course of my care at any time, without affecting my right to future care or treatment, and that the Practitioner or I may terminate the telemedicine visit at any time. I understand that I have the right to inspect all information obtained and/or recorded in the course of the telemedicine visit and may receive copies of available information for a reasonable fee.  I understand that some of the potential risks of receiving the Services via telemedicine include:  [name] Delay or interruption in medical evaluation due to technological equipment failure or disruption; . Information transmitted may not be sufficient (e.g. poor resolution of images) to allow for appropriate medical decision making by the Practitioner; and/or  . In rare instances, security protocols could fail, causing a breach of personal health information.  Furthermore, I acknowledge that it is my responsibility to provide information about my medical history, conditions and care that is complete and accurate to the best of my ability. I acknowledge that Practitioner's advice, recommendations, and/or decision may be based on factors not within their control, such as incomplete or inaccurate data provided by me or distortions of diagnostic images or specimens that may result from electronic transmissions. I understand that the practice of medicine is not an exact science and that Practitioner makes no warranties or guarantees regarding treatment outcomes. I acknowledge that I will receive a copy of this consent concurrently upon execution via email to the email address I last provided but may also request a printed copy by calling the office of [place].    I understand that my insurance will be billed for this visit.   I have read or had this consent read to me.  . I understand the contents of this consent, which adequately explains the benefits and risks of the Services being provided via telemedicine.  . I have been provided ample opportunity to ask questions regarding this consent and the Services and have had my questions answered to my satisfaction. . I give my informed consent for the services to be provided through the use of telemedicine in my medical care  By participating in this telemedicine visit I agree to the above.

## 2019-02-02 ENCOUNTER — Other Ambulatory Visit: Payer: Self-pay

## 2019-02-02 ENCOUNTER — Ambulatory Visit (INDEPENDENT_AMBULATORY_CARE_PROVIDER_SITE_OTHER): Payer: Self-pay | Admitting: Surgery

## 2019-02-02 ENCOUNTER — Encounter: Payer: Self-pay | Admitting: Surgery

## 2019-02-02 ENCOUNTER — Ambulatory Visit
Admission: RE | Admit: 2019-02-02 | Discharge: 2019-02-02 | Disposition: A | Discharge status: Home / Self Care | Payer: Medicare Other | Source: Ambulatory Visit | Attending: Surgery | Admitting: Surgery

## 2019-02-02 VITALS — BP 100/74 | HR 86 | Resp 16 | Ht 66.0 in | Wt 174.6 lb

## 2019-02-02 DIAGNOSIS — Z951 Presence of aortocoronary bypass graft: Secondary | ICD-10-CM

## 2019-02-02 DIAGNOSIS — I2510 Atherosclerotic heart disease of native coronary artery without angina pectoris: Secondary | ICD-10-CM

## 2019-02-02 NOTE — Progress Notes (Signed)
4 PREVIOUS CHEST TUBE SUTURES REMOVED EASILY.

## 2019-02-02 NOTE — Progress Notes (Signed)
    HPI: Patient returns for routine postoperative follow-up having undergone coronary artery bypass graft surgery x5 on 01/05/2019. The patient's early postoperative recovery while in the hospital was notable for an uncomplicated postoperative course. Since hospital discharge the patient reports that she has been feeling well.  She is walking daily without chest pain or shortness of breath.  She has had no peripheral edema.   Current Outpatient Medications  Medication Sig Dispense Refill  . aspirin EC 325 MG EC tablet Take 1 tablet (325 mg total) by mouth daily.  0  . atorvastatin (LIPITOR) 80 MG tablet Take 1 tablet (80 mg total) by mouth daily at 6 PM. 30 tablet 1  . Digestive Enzymes (DIGESTIVE ENZYME PO) Take 1 capsule by mouth 2 (two) times daily.     [name] loratadine (CLARITIN) 10 MG tablet Take 10 mg by mouth daily.     . metoprolol tartrate (LOPRESSOR) 25 MG tablet Take 0.5 tablets (12.5 mg total) by mouth 2 (two) times daily. 30 tablet 1  . Multiple Vitamin (MULTI VITAMIN DAILY PO) Take 1 tablet by mouth daily.      No current facility-administered medications for this visit.     Physical Exam: BP 100/74 (BP Location: Right Arm, Patient Position: Sitting, Cuff Size: Large)   Pulse 86   Resp 16   Ht 5\' 6"  (1.676 m)   Wt 174 lb 9.6 oz (79.2 kg)   SpO2 99% Comment: ON RA  BMI 28.18 kg/m  She looks well. Cardiac exam shows a regular rate and rhythm with normal heart sounds. Lung exam is clear. Chest incision is healing well and the sternum is stable. The chest tube sutures have been removed today and those sites are well-healed. The leg incisions are healing well and there is no peripheral edema.  Diagnostic Tests:  CLINICAL DATA:  Status post CABG. No current complaints.  EXAM: CHEST - 2 VIEW  COMPARISON:  01/07/2019  FINDINGS: Sequelae of CABG are again identified. Right jugular sheath has been removed. The cardiac silhouette is upper limits of normal in size.  Aortic atherosclerosis is noted. There is improved aeration of the lung bases, however there is new mild opacity in the lateral right lung base and lateral right midlung. No pleural effusion or pneumothorax is identified. No acute osseous abnormality is seen.  IMPRESSION: Overall improved lung aeration though with new mild opacity laterally in the right mid lung and right lung base which could reflect acute infection.   Electronically Signed   By: Logan Bores M.D.   On: 02/02/2019 11:26   Impression:  Overall I think she is making good progress following coronary bypass graft surgery.  I encouraged her to continue increasing the distance that she is walking.  She would like to participate in cardiac rehabilitation but that is on hold at the current time due to the coronavirus pandemic.  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  She will continue her current medications but has decreased her aspirin to 81 mg daily.  Plan:  She will continue to follow-up with her primary physician and has a telemedicine visit next week with cardiology.  She will return to see me if she has any problems with her incisions.   Gaye Pollack, MD [facility] [phone]

## 2019-02-04 ENCOUNTER — Ambulatory Visit: Payer: Medicare Other | Admitting: Surgery

## 2019-02-09 ENCOUNTER — Telehealth: Payer: Self-pay | Admitting: Physician Assistant

## 2019-02-09 NOTE — Telephone Encounter (Signed)
Call smartphone/ virtual consent/ my chart/ pre reg completed

## 2019-02-09 NOTE — Telephone Encounter (Signed)
LVM for pre reg °

## 2019-02-10 ENCOUNTER — Telehealth (INDEPENDENT_AMBULATORY_CARE_PROVIDER_SITE_OTHER): Payer: Medicare Other | Admitting: Physician Assistant

## 2019-02-10 ENCOUNTER — Other Ambulatory Visit: Payer: Self-pay | Admitting: Physician Assistant

## 2019-02-10 ENCOUNTER — Encounter: Payer: Self-pay | Admitting: Physician Assistant

## 2019-02-10 VITALS — BP 128/74 | HR 86

## 2019-02-10 DIAGNOSIS — I25810 Atherosclerosis of coronary artery bypass graft(s) without angina pectoris: Secondary | ICD-10-CM | POA: Diagnosis not present

## 2019-02-10 DIAGNOSIS — E785 Hyperlipidemia, unspecified: Secondary | ICD-10-CM

## 2019-02-10 DIAGNOSIS — C50911 Malignant neoplasm of unspecified site of right female breast: Secondary | ICD-10-CM

## 2019-02-10 DIAGNOSIS — R7303 Prediabetes: Secondary | ICD-10-CM

## 2019-02-10 MED ORDER — ATORVASTATIN CALCIUM 80 MG PO TABS: 80.0000 mg | ORAL_TABLET | Freq: Every day | ORAL | 11 refills | Status: DC

## 2019-02-10 MED ORDER — METOPROLOL TARTRATE 25 MG PO TABS: 12.5000 mg | ORAL_TABLET | Freq: Two times a day (BID) | ORAL | 11 refills | Status: DC

## 2019-02-10 NOTE — Progress Notes (Signed)
Virtual Visit via Telephone Note   This visit type was conducted due to national recommendations for restrictions regarding the COVID-19 Pandemic (e.g. social distancing) in an effort to limit this patient's exposure and mitigate transmission in our community.  Due to her co-morbid illnesses, this patient is at least at moderate risk for complications without adequate follow up.  This format is felt to be most appropriate for this patient at this time.  The patient did not have access to video technology/had technical difficulties with video requiring transitioning to audio format only (telephone).  All issues noted in this document were discussed and addressed.  No physical exam could be performed with this format.  Please refer to the patient's chart for her  consent to telehealth for [facility].   Evaluation Performed:  Follow-up visit  Date:  02/11/2019   ID:  Jessica [name], Jessica [name] [birth year], MRN [id number]  Patient Location: Home Provider Location: Home  PCP:  Jessica Koch, MD  Cardiologist:  Jessica Burow, MD Electrophysiologist:  None   Chief Complaint:  Post hospital followup  History of Present Illness:    Jessica [name] is a 68 y.o. female with hyperlipidemia, prediabetes and history of right breast cancer s/p radiation and right lumpectomy who recently presented to the hospital on 12/31/2018 with exertional chest pain.  While in the ED, her heart rate frequently dipped down to the 40s, she has a longstanding history of asymptomatic sinus bradycardia.  Her symptom was concerning for unstable angina.  Echocardiogram obtained on 3/11 showed EF 60 to 65%, mild asymmetric LVH, mild tricuspid regurgitation, otherwise no significant valvular issue.  She was eventually taken to the Cath Lab on 01/01/2019 which revealed 75% mid LAD lesion, 40% proximal LAD lesion, 95% ostial D1, 95% proximal left circumflex, 80% distal RCA lesion, EF 55 to 65%.  Cardiothoracic surgery was  consulted for bypass surgery.  She ultimately underwent CABG x5 with LIMA to LAD, SVG to diagonal, sequential SVG to OM1/OM 2, SVG to RCA by Dr. Cyndia Bent on 01/05/2019.  Postoperation, high-dose aspirin, beta-blocker and statin was added to her medical regimen.  She did have mild volume overload after surgery but responded well to diuretic.  Short course of diuretic was given after discharge.  Patient was contacted through telephone visit today.  She does have some soreness after the recent surgery however recovering well otherwise.  She also admits to have some bruising near the vein harvesting site in the lower extremity.  Otherwise she denies any exertional chest pain or shortness of breath.  She is building up activity level at this time.  Otherwise she denies any lower extremity edema, orthopnea or PND.  She did take a month of metoprolol tartrate, however did note take any more metoprolol after finishing the first month.  I have restarted her on 12.5 mg twice daily of metoprolol.  She is not sure which dose of aspirin she is on as has been time, I informed her she will need to take 325 mg aspirin for at least for the first 3 months, and after that, it is up to her cardiologist to decide when to lower the dose to 81 mg aspirin.  I will arrange fasting lipid panel and liver function test in 1 month.  She can follow-up with Dr. Gwenlyn Found in 2 months.  The patient does not have symptoms concerning for COVID-19 infection (fever, chills, cough, or new shortness of breath).    Past Medical History:  Diagnosis Date  Allergy    Breast cancer ([place]) 2000   Cancer [facility])    breast   Colon polyp    GERD (gastroesophageal reflux disease)    Hyperlipidemia    Migraine    Personal history of radiation therapy 2000   Past Surgical History:  Procedure Laterality Date   ABDOMINAL HYSTERECTOMY     BREAST LUMPECTOMY Right 2000   BREAST SURGERY     CORONARY ARTERY BYPASS GRAFT N/A 01/05/2019    Procedure: CORONARY ARTERY BYPASS GRAFTING (CABG), using right leg saphenous endoscopic and open vein harvest, exploration left leg;  Surgeon: Gaye Pollack, MD;  Location: [place];  Service: Open Heart Surgery;  Laterality: N/A;   EYE SURGERY     hemorrhoid     LEFT HEART CATH AND CORONARY ANGIOGRAPHY N/A 01/01/2019   Procedure: LEFT HEART CATH AND CORONARY ANGIOGRAPHY;  Surgeon: Jettie Booze, MD;  Location: [place] CV LAB;  Service: Cardiovascular;  Laterality: N/A;   TEE WITHOUT CARDIOVERSION N/A 01/05/2019   Procedure: TRANSESOPHAGEAL ECHOCARDIOGRAM (TEE);  Surgeon: Gaye Pollack, MD;  Location: [place];  Service: Open Heart Surgery;  Laterality: N/A;     Current Meds  Medication Sig   atorvastatin (LIPITOR) 80 MG tablet Take 1 tablet (80 mg total) by mouth daily at 6 PM.   loratadine (CLARITIN) 10 MG tablet Take 10 mg by mouth daily.    Multiple Vitamin (MULTI VITAMIN DAILY PO) Take 1 tablet by mouth daily.    [DISCONTINUED] atorvastatin (LIPITOR) 80 MG tablet Take 1 tablet (80 mg total) by mouth daily at 6 PM.     Allergies:   Codeine; Sulfa antibiotics; and Sulfacetamide   Social History   Tobacco Use   Smoking status: Never Smoker   Smokeless tobacco: Never Used  Substance Use Topics   Alcohol use: No    Frequency: Never   Drug use: No     Family Hx: The patient's family history includes Asthma in her paternal aunt; Diabetes in her paternal aunt; Heart disease in her brother; Kidney disease in her paternal uncle; Stroke in her paternal uncle.  ROS:   Please see the history of present illness.     All other systems reviewed and are negative.   Prior CV studies:   The following studies were reviewed today:  Echo 12/31/2018 IMPRESSIONS    1. The left ventricle has normal systolic function with an ejection fraction of 60-65%. The cavity size was normal. There is mild asymmetric left ventricular hypertrophy. Left ventricular diastolic parameters  were normal. No evidence of left ventricular  regional wall motion abnormalities.  2. The right ventricle has normal systolic function. The cavity was normal. There is no increase in right ventricular wall thickness.  3. The aortic valve is tricuspid Aortic valve regurgitation is mild by color flow Doppler.  4. The inferior vena cava was normal in size with <50% respiratory variability.   Cath 01/01/2019  Mid LAD lesion is 75% stenosed.  Prox LAD lesion is 40% stenosed.  Ost 1st Diag lesion is 95% stenosed.  Prox Cx lesion is 95% stenosed.  Dist RCA lesion is 80% stenosed.  The left ventricular systolic function is normal.  LV end diastolic pressure is normal.  The left ventricular ejection fraction is 55-65% by visual estimate.  There is no aortic valve stenosis.   Multivessel CAD including severe disease at the mid LAD, proximal diagonal, proximal circumflex and RCA at the bifurcation of the PDA and PLA.  Plan for  surgery consult.      CABG 01/05/2019 1. Median Sternotomy 2. Extracorporeal circulation 3.   Coronary artery bypass grafting x 5   Left internal mammary graft to the LAD  SVG to diagonal  Sequential SVG to OM1 and OM2  SVG to RCA 4.   Open vein harvest from the right leg  Labs/Other Tests and Data Reviewed:    EKG:  An ECG dated 01/06/2019 was personally reviewed today and demonstrated:  Normal sinus rhythm with poor R wave progression.  Recent Labs: 05/16/2018: TSH 1.32 09/17/2018: ALT 33 01/06/2019: Magnesium 2.2 01/07/2019: BUN 9; Creatinine, Ser 0.98; Potassium 4.0; Sodium 134 01/09/2019: Hemoglobin 9.6; Platelets 182   Recent Lipid Panel Lab Results  Component Value Date/Time   CHOL 223 (H) 01/01/2019 04:44 AM   TRIG 122 01/01/2019 04:44 AM   HDL 33 (L) 01/01/2019 04:44 AM   CHOLHDL 6.8 01/01/2019 04:44 AM   LDLCALC 166 (H) 01/01/2019 04:44 AM    Wt Readings from Last 3 Encounters:  02/02/19 174 lb 9.6 oz (79.2 kg)  01/10/19 181 lb  3.5 oz (82.2 kg)  12/31/18 189 lb (85.7 kg)     Objective:    Vital Signs:  BP 128/74    Pulse 86    VITAL SIGNS:  reviewed  ASSESSMENT & PLAN:    1. CAD s/p CABG: She does have some chest soreness after bypass surgery otherwise denies any exertional chest pain or shortness of breath.  She is not sure which dose of aspirin she is on, I asked her to continue aspirin 325 mg daily.  2. Hyperlipidemia: Continue high-dose statin.  Fasting lipid panel and LFT in 1 month.  3. Prediabetes: Managed by primary care provider.  4. History of right breast cancer s/p radiation and right lumpectomy: No recurrence.   COVID-19 Education: The signs and symptoms of COVID-19 were discussed with the patient and how to seek care for testing (follow up with PCP or arrange E-visit).  The importance of social distancing was discussed today.  Time:   Today, I have spent 21 minutes with the patient with telehealth technology discussing the above problems.     Medication Adjustments/Labs and Tests Ordered: Current medicines are reviewed at length with the patient today.  Concerns regarding medicines are outlined above.   Tests Ordered: Orders Placed This Encounter  Procedures   Hepatic function panel   Lipid panel    Medication Changes: Meds ordered this encounter  Medications   atorvastatin (LIPITOR) 80 MG tablet    Sig: Take 1 tablet (80 mg total) by mouth daily at 6 PM.    Dispense:  30 tablet    Refill:  11   metoprolol tartrate (LOPRESSOR) 25 MG tablet    Sig: Take 0.5 tablets (12.5 mg total) by mouth 2 (two) times daily.    Dispense:  30 tablet    Refill:  11    Disposition:  Follow up in 2 month(s)  Signed, Almyra Deforest, PA  02/11/2019 11:34 PM    [facility]

## 2019-02-10 NOTE — Patient Instructions (Signed)
Medication Instructions:   RESTART Metoprolol Tartrate (Lopressor) 12.5 Mg (half tablet) 2 times a day If you need a refill on your cardiac medications before your next appointment, please call your pharmacy.   Lab work:  You will need to come into our office foe labs (blood work) in 1 month: Lipid Liver  If you have labs (blood work) drawn today and your tests are completely normal, you will receive your results only by: [name] MyChart Message (if you have MyChart) OR . A paper copy in the mail If you have any lab test that is abnormal or we need to change your treatment, we will call you to review the results.  Testing/Procedures:  NONE ordered at this time of appointment   Follow-Up: At [facility], you and your health needs are our priority.  As part of our continuing mission to provide you with exceptional heart care, we have created designated Provider Care Teams.  These Care Teams include your primary Cardiologist (physician) and Advanced Practice Providers (APPs -  Physician Assistants and Nurse Practitioners) who all work together to provide you with the care you need, when you need it. You will need a follow up appointment in 2 months.  Please call our office 2 months in advance to schedule this appointment.  You may see Quay Burow, MD or one of the following Advanced Practice Providers on your designated Care Team:   Kerin Ransom, PA-C Roby Lofts, [place] . Sande Rives, PA-C  Any Other Special Instructions Will Be Listed Below (If Applicable). Use Tylenol only as needed for mild to moderate pain

## 2019-02-16 ENCOUNTER — Telehealth: Payer: Self-pay | Admitting: Physician Assistant

## 2019-02-16 NOTE — Telephone Encounter (Signed)
Pt called to report that over the past week she has noticed increased discomfort at her post CABG incision site.. she says that it is mostly when she goes from sitting to standing... she denies chest pain, sob, dizziness... says the pain is more superficial... it is not red, hot, swollen, no drainage.. she has not been doing any unusual physical activity for her.. she says it feels like a sharp and sometimes pulling sensation.  I advised her that it may be a part of the normal healing process and it is painful when the scar tissue forming is pulled... I urged her to call Dr. Vivi Martens office and talk  with his team about the "normal" healing process and if this is something that may need to be assessed.. she is at about 7 weeks post op.   Pt agreed and will call us back if they feel she needs Cardiology follow up.

## 2019-02-16 NOTE — Telephone Encounter (Signed)
Patient called today stating she had open heart surgery about 7 weeks ago and is experiencing pain around the cavity of her heart and when sits and gets up she has wait a minute before she can straighten herself up, the incision is painful.  No SOB, or swelling, no drainage or redness and incision site.

## 2019-02-19 ENCOUNTER — Other Ambulatory Visit: Payer: Self-pay

## 2019-02-20 ENCOUNTER — Encounter: Payer: Self-pay | Admitting: Surgery

## 2019-02-20 ENCOUNTER — Other Ambulatory Visit: Payer: Self-pay

## 2019-02-20 ENCOUNTER — Ambulatory Visit
Admission: RE | Admit: 2019-02-20 | Discharge: 2019-02-20 | Disposition: A | Discharge status: Home / Self Care | Payer: Medicare Other | Source: Ambulatory Visit | Attending: Surgery | Admitting: Surgery

## 2019-02-20 ENCOUNTER — Ambulatory Visit (INDEPENDENT_AMBULATORY_CARE_PROVIDER_SITE_OTHER): Payer: Self-pay | Admitting: Surgery

## 2019-02-20 VITALS — BP 118/72 | HR 80 | Temp 97.3°F | Resp 20 | Ht 66.0 in | Wt 179.0 lb

## 2019-02-20 DIAGNOSIS — Z951 Presence of aortocoronary bypass graft: Secondary | ICD-10-CM

## 2019-02-20 DIAGNOSIS — I2510 Atherosclerotic heart disease of native coronary artery without angina pectoris: Secondary | ICD-10-CM

## 2019-02-20 DIAGNOSIS — R079 Chest pain, unspecified: Secondary | ICD-10-CM

## 2019-02-20 NOTE — Progress Notes (Signed)
HPI:  The patient returns today for examination of her chest incision because she has started having some discomfort on both sides of her chest as well as some pain along the sternum when she gets up from a sitting position.  She is about 6 weeks following coronary bypass surgery.  She said that she had some pain with coughing when she went home but that resolved.  This new pain just started recently.  She has had no fever or chills.  She denies any shortness of breath.  She has been ambulating daily.  Current Outpatient Medications  Medication Sig Dispense Refill  . aspirin EC 325 MG EC tablet Take 1 tablet (325 mg total) by mouth daily.  0  . atorvastatin (LIPITOR) 80 MG tablet Take 1 tablet (80 mg total) by mouth daily at 6 PM. 30 tablet 11  . Digestive Enzymes (DIGESTIVE ENZYME PO) Take 1 capsule by mouth 2 (two) times daily.     [name] loratadine (CLARITIN) 10 MG tablet Take 10 mg by mouth daily.     . metoprolol tartrate (LOPRESSOR) 25 MG tablet Take 0.5 tablets (12.5 mg total) by mouth 2 (two) times daily. 30 tablet 11  . Multiple Vitamin (MULTI VITAMIN DAILY PO) Take 1 tablet by mouth daily.      No current facility-administered medications for this visit.      Physical Exam: BP 118/72   Pulse 80   Temp (!) 97.3 F (36.3 C) (Skin)   Resp 20   Ht 5\' 6"  (1.676 m)   Wt 179 lb (81.2 kg)   SpO2 98% Comment: RA  BMI 28.89 kg/m  She looks well. The chest incision is well-healed and the sternum is stable. Cardiac exam shows a regular rate and rhythm with normal heart sounds. Lungs are clear. The right leg incisions are well-healed.  There are several suture knots present at the ends of the incisions which had not yet separated.  Diagnostic Tests:  CLINICAL DATA:  Chest pain.  CABG 6 weeks ago.  EXAM: CHEST - 2 VIEW  COMPARISON:  Chest x-ray dated February 02, 2019.  FINDINGS: Normal heart size status post CABG. Normal mediastinal contours. Normal pulmonary vascularity.  Essentially resolved peripheral opacities in the right mid and lower lung. No focal consolidation, pleural effusion, or pneumothorax. No acute osseous abnormality.  IMPRESSION: 1.  No active cardiopulmonary disease. 2. Nearly resolved peripheral opacities in the right mid and lower lung.   Electronically Signed   By: Titus Dubin M.D.   On: 02/20/2019 11:48  Impression:  Overall I think she is making a good recovery following her bypass surgery.  Her chest incision is well-healed and the sternum feels stable.  Chest x-ray is unremarkable.  All the sternal wires are intact.  I suspect the etiology of her chest wall discomfort is related to some activity.  This can persist for up to 3 months.  Once in a while patients have prolonged chest wall discomfort related to the sternal wires and occasionally require removal of them if the pain persist.  At this point I have recommended that we continue observing this.  She appears relieved that I do not think there is anything physically wrong and that everything looks fine on chest x-ray.  I advised her against doing any heavy lifting of more than 10 pounds or pushing or pulling for 3 months postoperatively.  Plan:  She will continue to increase her activity as tolerated and if this pain persists  beyond 3 months then she will return to see me for reevaluation.   Gaye Pollack, MD [facility] [phone]

## 2019-03-12 LAB — HEPATIC FUNCTION PANEL
ALT: 29 IU/L (ref 0–32)
AST: 35 IU/L (ref 0–40)
Albumin: 4.4 g/dL (ref 3.8–4.8)
Alkaline Phosphatase: 79 IU/L (ref 39–117)
Bilirubin Total: 0.5 mg/dL (ref 0.0–1.2)
Bilirubin, Direct: 0.14 mg/dL (ref 0.00–0.40)
Total Protein: 7.2 g/dL (ref 6.0–8.5)

## 2019-03-12 LAB — LIPID PANEL
Chol/HDL Ratio: 3.4 ratio (ref 0.0–4.4)
Cholesterol, Total: 113 mg/dL (ref 100–199)
HDL: 33 mg/dL — ABNORMAL LOW (ref >39)
LDL Calculated: 63 mg/dL (ref 0–99)
Triglycerides: 83 mg/dL (ref 0–149)
VLDL Cholesterol Cal: 17 mg/dL (ref 5–40)

## 2019-03-18 ENCOUNTER — Encounter: Payer: Self-pay | Admitting: *Deleted

## 2019-03-19 ENCOUNTER — Telehealth: Payer: Self-pay | Admitting: Cardiovascular Disease

## 2019-03-19 ENCOUNTER — Telehealth: Payer: Self-pay

## 2019-03-19 NOTE — Telephone Encounter (Signed)
New Message    Pt is returning call for results    Please call

## 2019-03-19 NOTE — Telephone Encounter (Signed)
Left a voice message for the patient to call back for lab results

## 2019-03-19 NOTE — Progress Notes (Signed)
Cholesterol significantly improved. Liver function normal.

## 2019-03-20 NOTE — Progress Notes (Signed)
Patient notified of results by Veronda Prude, LPN. She verbalized an understanding and had no further questions

## 2019-03-20 NOTE — Telephone Encounter (Signed)
Notes recorded by Almyra Deforest, PA on 03/19/2019 at 10:58 AM EDT Cholesterol significantly improved. Liver function normal.  Pt informed of providers result & recommendations. Pt verbalized understanding. No further questions .

## 2019-04-07 ENCOUNTER — Telehealth: Payer: Self-pay | Admitting: *Deleted

## 2019-04-07 NOTE — Telephone Encounter (Signed)
Called to review the Virtual Home Based Cardiac Rehab program. She is waiting to see surgeon for possible wire removal.  She will call us when she is ready.

## 2019-04-08 ENCOUNTER — Ambulatory Visit (INDEPENDENT_AMBULATORY_CARE_PROVIDER_SITE_OTHER): Payer: Medicare Other | Admitting: Cardiovascular Disease

## 2019-04-08 ENCOUNTER — Encounter: Payer: Self-pay | Admitting: Cardiovascular Disease

## 2019-04-08 ENCOUNTER — Other Ambulatory Visit: Payer: Self-pay

## 2019-04-08 ENCOUNTER — Telehealth: Payer: Self-pay | Admitting: Pharmacist Clinician (PhC)/ Clinical Pharmacy Specialist

## 2019-04-08 DIAGNOSIS — E785 Hyperlipidemia, unspecified: Secondary | ICD-10-CM | POA: Insufficient documentation

## 2019-04-08 DIAGNOSIS — Z951 Presence of aortocoronary bypass graft: Secondary | ICD-10-CM

## 2019-04-08 DIAGNOSIS — E782 Mixed hyperlipidemia: Secondary | ICD-10-CM

## 2019-04-08 MED ORDER — ROSUVASTATIN CALCIUM 20 MG PO TABS: ORAL_TABLET | ORAL | 5 refills | Status: DC

## 2019-04-08 NOTE — Progress Notes (Signed)
04/08/2019 Nicholas Lose   Apr 19, 1951  [id number]  Primary Physician Hoyt Koch, MD Primary Cardiologist: Lorretta Harp MD Lupe Carney, [place]  HPI:  Jessica [name] is a 68 y.o. 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  She does have a history of hyperlipidemia.  She presented to [facility] on 12/31/2018 with unstable angina and underwent cardiac catheterization by Dr. Irish Lack the following day revealing three-vessel disease with preserved LV function.  On 01/05/2019 she underwent CABG x5 by Dr. Cyndia Bent the LIMA to her LAD, vein to diagonal branch, RCA and obtuse marginal branches 1 and 2 sequentially.  She is been complaining of some musculoskeletal pains at home with some mild shortness of breath.  She did not participate in cardiac rehab because of COVID-19.  She was put on high-dose statin therapy which she was intolerant to all that responsive to.   Current Meds  Medication Sig  . aspirin EC 325 MG EC tablet Take 1 tablet (325 mg total) by mouth daily.  . Digestive Enzymes (DIGESTIVE ENZYME PO) Take 1 capsule by mouth 2 (two) times daily.   [name] loratadine (CLARITIN) 10 MG tablet Take 10 mg by mouth daily.   . metoprolol tartrate (LOPRESSOR) 25 MG tablet Take 0.5 tablets (12.5 mg total) by mouth 2 (two) times daily.  . Multiple Vitamin (MULTI VITAMIN DAILY PO) Take 1 tablet by mouth daily.      Allergies  Allergen Reactions  . Codeine   . Sulfa Antibiotics   . Sulfacetamide     Other reaction(s): Other (See Comments) swelling    Social History   Socioeconomic History  . Marital status: Married    Spouse name: Not on file  . Number of children: Not on file  . Years of education: Not on file  . Highest education level: Master's degree (e.g., MA, MS, MEng, MEd, MSW, MBA)  Occupational History  . Not on file  Social Needs  . Financial resource  strain: Not hard at all  . Food insecurity    Worry: Never true    Inability: Never true  . Transportation needs    Medical: No    Non-medical: No  Tobacco Use  . Smoking status: Never Smoker  . Smokeless tobacco: Never Used  Substance and Sexual Activity  . Alcohol use: No    Frequency: Never  . Drug use: No  . Sexual activity: Yes  Lifestyle  . Physical activity    Days per week: 0 days    Minutes per session: 0 min  . Stress: To some extent  Relationships  . Social connections    Talks on phone: More than three times a week    Gets together: More than three times a week    Attends religious service: More than 4 times per year    Active member of club or organization: Not on file    Attends meetings of clubs or organizations: More than 4 times per year    Relationship status: Married  . Intimate partner violence    Fear of current or ex partner: No    Emotionally abused: No    Physically abused: No    Forced sexual activity: No  Other Topics Concern  . Not on file  Social History Narrative  . Not on file     Review of Systems: General: negative for chills, fever,  night sweats or weight changes.  Cardiovascular: negative for chest pain, dyspnea on exertion, edema, orthopnea, palpitations, paroxysmal nocturnal dyspnea or shortness of breath Dermatological: negative for rash Respiratory: negative for cough or wheezing Urologic: negative for hematuria Abdominal: negative for nausea, vomiting, diarrhea, bright red blood per rectum, melena, or hematemesis Neurologic: negative for visual changes, syncope, or dizziness All other systems reviewed and are otherwise negative except as noted above.    Blood pressure 128/82, pulse 71, temperature 98.7 F (37.1 C), height 5\' 6"  (1.676 m).  General appearance: alert and no distress Neck: no adenopathy, no carotid bruit, no JVD, supple, symmetrical, trachea midline and thyroid not enlarged, symmetric, no  tenderness/mass/nodules Lungs: clear to auscultation bilaterally Heart: regular rate and rhythm, S1, S2 normal, no murmur, click, rub or gallop Extremities: extremities normal, atraumatic, no cyanosis or edema Pulses: 2+ and symmetric Skin: Skin color, texture, turgor normal. No rashes or lesions Neurologic: Alert and oriented X 3, normal strength and tone. Normal symmetric reflexes. Normal coordination and gait  EKG not performed today  ASSESSMENT AND PLAN:   Hyperlipidemia History of hyperlipidemia intolerant to statin therapy although Lipitor did improve her LDL from 166 down to 63.  Has a history of ischemic heart disease.  She would be a candidate for Repatha.  S/P CABG x 5 History of CAD status post CABG x5 by Dr. Cyndia Bent 01/05/2019 with a LIMA to the LAD, vein to diagonal branch, RCA and OM1 and OM 2 sequentially.  This was done after Dr. Irish Lack performed left heart cath 01/01/2019 revealing three-vessel disease with preserved LV function.  She is complaining of some vague musculoskeletal pain which sounds more positional and does not sound cardiac.  She did not participate in cardiac rehab because of COVID-19.      Lorretta Harp MD FACP,FACC,FAHA, [facility] 04/08/2019 12:37 PM

## 2019-04-08 NOTE — Telephone Encounter (Signed)
Patient saw Dr. Gwenlyn Found in office today post CABG x 5, discharged 01/10/19.  She was discharged with atorvastatin 80 mg daily (was on simvastatin 20 mg prior to hospitalization).  She had an excellent response to the atorvastatin, however she has recently noticed pain in her shoulders that radiates into her abdomen.  It was believed to be caused by the atorvastatin and she stopped it 3 weeks ago.  Today she states that the pain is still there.      In addition to ASCVD, her medical history is significant for degenerative joint disease of lower back, GERD and prior breast cancer.    Current medications for hyperlipidemia: none  Medications tried/failed: atorvastatin 80 mg (muscle pain in shoulder), simvastatin 20 mg (failed to achieve LDL lowering)   Labs:   Date Total Cholesterol Triglycerides HDL LDL  02/2019 113 83 33 63  12/2018 223 122 33 166     Assessment/Plan:  Will have patient continue for 2 more weeks without statin to see if musculoskeletal pain dissipates.  If it has not gone by July 1 (5 weeks without atorvastatin), I doubt that this was the cause.  In the meantime she is going to start taking Cholestoff and Benefiber daily to help, and we discussed eating heart healthy and naturally cholesterol lowering foods such as oatmeal and almonds.  Assuming the pain is gone by July 1, will have her start rosuvastatin 20 mg three times per week.  After 3-4 weeks she can increase by 1 tablet/week every 3-4 weeks until taking daily or develops myalgias.

## 2019-04-08 NOTE — Patient Instructions (Addendum)
Medication Instructions:  Your physician recommends that you continue on your current medications as directed. Please refer to the Current Medication list given to you today.  If you need a refill on your cardiac medications before your next appointment, please call your pharmacy.   Lab work: NONE  If you have labs (blood work) drawn today and your tests are completely normal, you will receive your results only by: [name] MyChart Message (if you have MyChart) OR . A paper copy in the mail If you have any lab test that is abnormal or we need to change your treatment, we will call you to review the results.  Testing/Procedures: NONE  Follow-Up: At [facility], you and your health needs are our priority.  As part of our continuing mission to provide you with exceptional heart care, we have created designated Provider Care Teams.  These Care Teams include your primary Cardiologist (physician) and Advanced Practice Providers (APPs -  Physician Assistants and Nurse Practitioners) who all work together to provide you with the care you need, when you need it. . You will need a follow up appointment in 3 months WITH AN APP and in 6 months WITH DR. Gwenlyn Found.  Please call our office 2 months in advance to schedule this appointment.  You may see one of the following Advanced Practice Providers on your designated Care Team:   . Kerin Ransom, [place] . Almyra Deforest, PA-C . Fabian Sharp, PA-C . Jory Sims, DNP . Rosaria Ferries, PA-C . Roby Lofts, PA-C . Sande Rives, PA-C  Any Other Special Instructions Will Be Listed Below (If Applicable).  Start Cholestoff ([employer]) and Benefiber in the next two weeks.  On July 1, if the pain in your shoulder/trunk is gone, then start the rosuvastatin three times weekly.

## 2019-04-08 NOTE — Assessment & Plan Note (Signed)
History of CAD status post CABG x5 by Dr. Cyndia Bent 01/05/2019 with a LIMA to the LAD, vein to diagonal branch, RCA and OM1 and OM 2 sequentially.  This was done after Dr. Irish Lack performed left heart cath 01/01/2019 revealing three-vessel disease with preserved LV function.  She is complaining of some vague musculoskeletal pain which sounds more positional and does not sound cardiac.  She did not participate in cardiac rehab because of COVID-19.

## 2019-04-08 NOTE — Assessment & Plan Note (Signed)
History of hyperlipidemia intolerant to statin therapy although Lipitor did improve her LDL from 166 down to 63.  Has a history of ischemic heart disease.  She would be a candidate for Repatha.

## 2019-04-14 ENCOUNTER — Other Ambulatory Visit: Payer: Self-pay

## 2019-04-14 ENCOUNTER — Encounter: Payer: Medicare Other | Attending: Cardiovascular Disease | Admitting: *Deleted

## 2019-04-14 DIAGNOSIS — Z951 Presence of aortocoronary bypass graft: Secondary | ICD-10-CM

## 2019-04-14 NOTE — Progress Notes (Signed)
Confirm Consent - "In the setting of the current Covid19 crisis, you are scheduled to join our "At Home" [facility]  Cardiac or Pulmonary  Rehab program . Just as we do with many in-gym visits, in order for you to participate in this program, we must obtain consent.  If you'd like, I can send this to your mychart (if signed up) or email for you to review.  Otherwise, I can obtain your verbal consent now.  By agreeing to a Cardiac or Pulmonary Rehab Telehealth visit, we'd like you to understand that the technology does not allow for your Cardiac or Pulmonary Rehab team member to perform a physical assessment, and thus may limit their ability to fully assess your ability to perform exercise programs. If your provider identifies any concerns that need to be evaluated in person, we will make arrangements to do so.  Finally, though the technology is pretty good, we cannot assure that it will always work on either your or our end and we cannot ensure that we have a secure connection.  Cardiac and Pulmonary Rehab Telehealth visits and "At Home" cardiac and pulmonary rehab are provided at no cost to you. Are you willing to proceed?" STAFF: Did the patient verbally acknowledge consent to telehealth visit? Document YES/NO here:YES   Date and Time   04/14/2019 1702                                             Staff completing consent process: Heath Lark RN BSN CCRP Email: [email or web address] Phone:  Diagnosis: CONSENT COMPLETED: Yes  Risk Stratification:high Risk Factors:  hypercholesterolemia/hyperlipidemia Current Exercise: walking 3 days a week 45 min Patient Exercise Barriers :none Mobility Assistive Device at Home:  none  Vital Sign Devices at [facility] pressure Exercise Equipment at Home:  ? Followup appointment made: Yes To use [facility]:No  Entered on Dashboard No  SMS sent with invite No   Follow up appt RD  6/29 3PM  And EP  6/24  3PM

## 2019-04-15 ENCOUNTER — Encounter: Payer: Medicare Other | Admitting: *Deleted

## 2019-04-15 DIAGNOSIS — Z951 Presence of aortocoronary bypass graft: Secondary | ICD-10-CM

## 2019-04-15 NOTE — Progress Notes (Signed)
Cardiac Individual Treatment Plan  Patient Details  Name: Jessica [name] MRN: [id number] Date of Birth: 22-Feb-1951 Referring Provider:     Cardiac Rehab from 04/15/2019 in [facility] Cardiac and Pulmonary Rehab  Referring Provider  Quay Burow MD      Initial Encounter Date:    Cardiac Rehab from 04/15/2019 in [facility] Cardiac and Pulmonary Rehab  Date  04/15/19      Visit Diagnosis: S/P CABG x 5  Patient's Home Medications on Admission:  Current Outpatient Medications:  .  aspirin EC 325 MG EC tablet, Take 1 tablet (325 mg total) by mouth daily., Disp: , Rfl: 0 .  atorvastatin (LIPITOR) 80 MG tablet, Take 1 tablet (80 mg total) by mouth daily at 6 PM. (Patient not taking: Reported on 04/08/2019), Disp: 30 tablet, Rfl: 11 .  Digestive Enzymes (DIGESTIVE ENZYME PO), Take 1 capsule by mouth 2 (two) times daily. , Disp: , Rfl:  .  loratadine (CLARITIN) 10 MG tablet, Take 10 mg by mouth daily. , Disp: , Rfl:  .  metoprolol tartrate (LOPRESSOR) 25 MG tablet, Take 0.5 tablets (12.5 mg total) by mouth 2 (two) times daily., Disp: 30 tablet, Rfl: 11 .  Multiple Vitamin (MULTI VITAMIN DAILY PO), Take 1 tablet by mouth daily. , Disp: , Rfl:  .  rosuvastatin (CRESTOR) 20 MG tablet, Start with 1 tablet three times weekly and increase to 1 tablet daily as tolerated, Disp: 30 tablet, Rfl: 5  Past Medical History: Past Medical History:  Diagnosis Date  . Allergy   . Breast cancer ([place]) 2000  . Cancer [facility])    breast  . Colon polyp   . GERD (gastroesophageal reflux disease)   . Hyperlipidemia   . Migraine   . Personal history of radiation therapy 2000    Tobacco Use: Social History   Tobacco Use  Smoking Status Never Smoker  Smokeless Tobacco Never Used    Labs: Recent Review Flowsheet Data    Labs for ITP Cardiac and Pulmonary Rehab Latest Ref Rng & Units 01/05/2019 01/05/2019 01/05/2019 01/05/2019 03/12/2019   Cholestrol 100 - 199 mg/dL - - - - 113   LDLCALC 0 - 99 mg/dL - - - - 63   HDL  >39 mg/dL - - - - 33(L)   Trlycerides 0 - 149 mg/dL - - - - 83   Hemoglobin A1c 4.8 - 5.6 % - - - - -   PHART 7.350 - 7.450 7.468(H) 7.380 7.315(L) 7.265(L) -   PCO2ART 32.0 - 48.0 mmHg 32.1 38.0 47.1 49.9(H) -   HCO3 20.0 - 28.0 mmol/L 23.3 22.9 23.9 22.6 -   TCO2 22 - 32 mmol/L _0 -   ACIDBASEDEF 0.0 - 2.0 mmol/L - 3.0(H) 2.0 4.0(H) -   O2SAT % 100.0 99.0 97.0 98.0 -       Exercise Target Goals: Exercise Program Goal: Individual exercise prescription set using results from initial 6 min walk test and THRR while considering  patient's activity barriers and safety.   Exercise Prescription Goal: Initial exercise prescription builds to 30-45 minutes a day of aerobic activity, 2-3 days per week.  Home exercise guidelines will be given to patient during program as part of exercise prescription that the participant will acknowledge.  Activity Barriers & Risk Stratification: Activity Barriers & Cardiac Risk Stratification - 04/15/19 1520      Activity Barriers & Cardiac Risk Stratification   Activity Barriers  Deconditioning;Muscular Weakness    Cardiac Risk Stratification  High  6 Minute Walk:   Oxygen Initial Assessment:   Oxygen Re-Evaluation:   Oxygen Discharge (Final Oxygen Re-Evaluation):   Initial Exercise Prescription: Initial Exercise Prescription - 04/15/19 1500      Date of Initial Exercise RX and Referring Provider   Date  04/15/19    Referring Provider  Quay Burow MD      Track   Minutes  45      Prescription Details   Frequency (times per week)  3    Duration  Progress to 45 minutes of aerobic exercise without signs/symptoms of physical distress      Intensity   THRR 40-80% of Max Heartrate  109-138    Ratings of Perceived Exertion  11-13    Perceived Dyspnea  0-4      Progression   Progression  Continue to progress workloads to maintain intensity without signs/symptoms of physical distress.      Resistance Training   Training  Prescription  Yes    Weight  ROM/Body Weight   has 5lbs weight to build to   Reps  10-15       Perform Capillary Blood Glucose checks as needed.  Exercise Prescription Changes:   Exercise Comments:   Exercise Goals and Review: Exercise Goals    Row Name 04/15/19 1521             Exercise Goals   Increase Physical Activity  Yes       Intervention  Provide advice, education, support and counseling about physical activity/exercise needs.;Develop an individualized exercise prescription for aerobic and resistive training based on initial evaluation findings, risk stratification, comorbidities and participant's personal goals.       Expected Outcomes  Short Term: Attend rehab on a regular basis to increase amount of physical activity.;Long Term: Add in home exercise to make exercise part of routine and to increase amount of physical activity.;Long Term: Exercising regularly at least 3-5 days a week.       Increase Strength and Stamina  Yes       Intervention  Provide advice, education, support and counseling about physical activity/exercise needs.;Develop an individualized exercise prescription for aerobic and resistive training based on initial evaluation findings, risk stratification, comorbidities and participant's personal goals.       Expected Outcomes  Short Term: Increase workloads from initial exercise prescription for resistance, speed, and METs.;Short Term: Perform resistance training exercises routinely during rehab and add in resistance training at home;Long Term: Improve cardiorespiratory fitness, muscular endurance and strength as measured by increased METs and functional capacity (6MWT)       Able to understand and use rate of perceived exertion (RPE) scale  Yes       Intervention  Provide education and explanation on how to use RPE scale       Expected Outcomes  Short Term: Able to use RPE daily in rehab to express subjective intensity level;Long Term:  Able to use RPE to  guide intensity level when exercising independently       Able to understand and use Dyspnea scale  Yes       Intervention  Provide education and explanation on how to use Dyspnea scale       Expected Outcomes  Short Term: Able to use Dyspnea scale daily in rehab to express subjective sense of shortness of breath during exertion;Long Term: Able to use Dyspnea scale to guide intensity level when exercising independently       Knowledge and understanding of Target Heart Rate  Range (THRR)  Yes       Intervention  Provide education and explanation of THRR including how the numbers were predicted and where they are located for reference       Expected Outcomes  Short Term: Able to state/look up THRR;Short Term: Able to use daily as guideline for intensity in rehab;Long Term: Able to use THRR to govern intensity when exercising independently       Able to check pulse independently  Yes       Intervention  Provide education and demonstration on how to check pulse in carotid and radial arteries.;Review the importance of being able to check your own pulse for safety during independent exercise       Expected Outcomes  Short Term: Able to explain why pulse checking is important during independent exercise;Long Term: Able to check pulse independently and accurately       Understanding of Exercise Prescription  Yes       Intervention  Provide education, explanation, and written materials on patient's individual exercise prescription       Expected Outcomes  Short Term: Able to explain program exercise prescription;Long Term: Able to explain home exercise prescription to exercise independently          Exercise Goals Re-Evaluation :   Discharge Exercise Prescription (Final Exercise Prescription Changes):   Nutrition:  Target Goals: Understanding of nutrition guidelines, daily intake of sodium '1500mg'$ , cholesterol '200mg'$ , calories 30% from fat and 7% or less from saturated fats, daily to have 5 or more  servings of fruits and vegetables.  Biometrics:    Nutrition Therapy Plan and Nutrition Goals:   Nutrition Assessments:   Nutrition Goals Re-Evaluation:   Nutrition Goals Discharge (Final Nutrition Goals Re-Evaluation):   Psychosocial: Target Goals: Acknowledge presence or absence of significant depression and/or stress, maximize coping skills, provide positive support system. Participant is able to verbalize types and ability to use techniques and skills needed for reducing stress and depression.   Initial Review & Psychosocial Screening:   Quality of Life Scores:   Scores of 19 and below usually indicate a poorer quality of life in these areas.  A difference of  2-3 points is a clinically meaningful difference.  A difference of 2-3 points in the total score of the Quality of Life Index has been associated with significant improvement in overall quality of life, self-image, physical symptoms, and general health in studies assessing change in quality of life.  PHQ-9: Recent Review Flowsheet Data    Depression screen [facility] 2/9 12/31/2018 10/03/2017 09/16/2017 07/27/2016   Decreased Interest 0 0 0 0   Down, Depressed, Hopeless 0 0 0 0   PHQ - 2 Score 0 0 0 0   Altered sleeping - 3 - -   Tired, decreased energy - 1 - -   Change in appetite - 0 - -   Feeling bad or failure about yourself  - 0 - -   Trouble concentrating - 0 - -   Moving slowly or fidgety/restless - 0 - -   Suicidal thoughts - 0 - -   PHQ-9 Score - 4 - -   Difficult doing work/chores - Not difficult at all - -     Interpretation of Total Score  Total Score Depression Severity:  1-4 = Minimal depression, 5-9 = Mild depression, 10-14 = Moderate depression, 15-19 = Moderately severe depression, 20-27 = Severe depression   Psychosocial Evaluation and Intervention:   Psychosocial Re-Evaluation:   Psychosocial Discharge (Final  Psychosocial Re-Evaluation):   Vocational Rehabilitation: Provide vocational  rehab assistance to qualifying candidates.   Vocational Rehab Evaluation & Intervention:   Education: Education Goals: Education classes will be provided on a variety of topics geared toward better understanding of heart health and risk factor modification. Participant will state understanding/return demonstration of topics presented as noted by education test scores.  Learning Barriers/Preferences:   Education Topics:  AED/CPR: - Group verbal and written instruction with the use of models to demonstrate the basic use of the AED with the basic ABC's of resuscitation.   General Nutrition Guidelines/Fats and Fiber: -Group instruction provided by verbal, written material, models and posters to present the general guidelines for heart healthy nutrition. Gives an explanation and review of dietary fats and fiber.   Controlling Sodium/Reading Food Labels: -Group verbal and written material supporting the discussion of sodium use in heart healthy nutrition. Review and explanation with models, verbal and written materials for utilization of the food label.   Exercise Physiology & General Exercise Guidelines: - Group verbal and written instruction with models to review the exercise physiology of the cardiovascular system and associated critical values. Provides general exercise guidelines with specific guidelines to those with heart or lung disease.    Aerobic Exercise & Resistance Training: - Gives group verbal and written instruction on the various components of exercise. Focuses on aerobic and resistive training programs and the benefits of this training and how to safely progress through these programs..   Flexibility, Balance, Mind/Body Relaxation: Provides group verbal/written instruction on the benefits of flexibility and balance training, including mind/body exercise modes such as yoga, pilates and tai chi.  Demonstration and skill practice provided.   Stress and Anxiety: -  Provides group verbal and written instruction about the health risks of elevated stress and causes of high stress.  Discuss the correlation between heart/lung disease and anxiety and treatment options. Review healthy ways to manage with stress and anxiety.   Depression: - Provides group verbal and written instruction on the correlation between heart/lung disease and depressed mood, treatment options, and the stigmas associated with seeking treatment.   Anatomy & Physiology of the Heart: - Group verbal and written instruction and models provide basic cardiac anatomy and physiology, with the coronary electrical and arterial systems. Review of Valvular disease and Heart Failure   Cardiac Procedures: - Group verbal and written instruction to review commonly prescribed medications for heart disease. Reviews the medication, class of the drug, and side effects. Includes the steps to properly store meds and maintain the prescription regimen. (beta blockers and nitrates)   Cardiac Medications I: - Group verbal and written instruction to review commonly prescribed medications for heart disease. Reviews the medication, class of the drug, and side effects. Includes the steps to properly store meds and maintain the prescription regimen.   Cardiac Medications II: -Group verbal and written instruction to review commonly prescribed medications for heart disease. Reviews the medication, class of the drug, and side effects. (all other drug classes)    Go Sex-Intimacy & Heart Disease, Get SMART - Goal Setting: - Group verbal and written instruction through game format to discuss heart disease and the return to sexual intimacy. Provides group verbal and written material to discuss and apply goal setting through the application of the S.M.A.R.T. Method.   Other Matters of the Heart: - Provides group verbal, written materials and models to describe Stable Angina and Peripheral Artery. Includes description of  the disease process and treatment options available to the  cardiac patient.   Exercise & Equipment Safety: - Individual verbal instruction and demonstration of equipment use and safety with use of the equipment.   Infection Prevention: - Provides verbal and written material to individual with discussion of infection control including proper hand washing and proper equipment cleaning during exercise session.   Falls Prevention: - Provides verbal and written material to individual with discussion of falls prevention and safety.   Diabetes: - Individual verbal and written instruction to review signs/symptoms of diabetes, desired ranges of glucose level fasting, after meals and with exercise. Acknowledge that pre and post exercise glucose checks will be done for 3 sessions at entry of program.   Know Your Numbers and Risk Factors: -Group verbal and written instruction about important numbers in your health.  Discussion of what are risk factors and how they play a role in the disease process.  Review of Cholesterol, Blood Pressure, Diabetes, and BMI and the role they play in your overall health.   Sleep Hygiene: -Provides group verbal and written instruction about how sleep can affect your health.  Define sleep hygiene, discuss sleep cycles and impact of sleep habits. Review good sleep hygiene tips.    Other: -Provides group and verbal instruction on various topics (see comments)   Knowledge Questionnaire Score:   Core Components/Risk Factors/Patient Goals at Admission: Personal Goals and Risk Factors at Admission - 04/15/19 1527      Core Components/Risk Factors/Patient Goals on Admission    Weight Management  Yes;Weight Loss    Intervention  Weight Management: Develop a combined nutrition and exercise program designed to reach desired caloric intake, while maintaining appropriate intake of nutrient and fiber, sodium and fats, and appropriate energy expenditure required for the  weight goal.;Weight Management: Provide education and appropriate resources to help participant work on and attain dietary goals.    Expected Outcomes  Short Term: Continue to assess and modify interventions until short term weight is achieved;Long Term: Adherence to nutrition and physical activity/exercise program aimed toward attainment of established weight goal;Weight Loss: Understanding of general recommendations for a balanced deficit meal plan, which promotes 1-2 lb weight loss per week and includes a negative energy balance of [phone] kcal/d;Understanding recommendations for meals to include 15-35% energy as protein, 25-35% energy from fat, 35-60% energy from carbohydrates, less than 261m of dietary cholesterol, 20-35 gm of total fiber daily;Understanding of distribution of calorie intake throughout the day with the consumption of 4-5 meals/snacks    Lipids  Yes    Intervention  Provide education and support for participant on nutrition & aerobic/resistive exercise along with prescribed medications to achieve LDL <745m HDL >4089m   Expected Outcomes  Short Term: Participant states understanding of desired cholesterol values and is compliant with medications prescribed. Participant is following exercise prescription and nutrition guidelines.;Long Term: Cholesterol controlled with medications as prescribed, with individualized exercise RX and with personalized nutrition plan. Value goals: LDL < 33m44mDL > 40 mg.       Core Components/Risk Factors/Patient Goals Review:    Core Components/Risk Factors/Patient Goals at Discharge (Final Review):    ITP Comments: ITP Comments    Row Name 04/14/19 1656 04/15/19 1519         ITP Comments  Initial Cardiac Home Based Care visit completed Intake and Consent completed Appts for followup done with EP and RD  Completed initial ExRx created and sent to Dr. MarkEmily Filbertdical Director to review and sign.         Comments:  Initial Virtual  ExRx

## 2019-04-15 NOTE — Progress Notes (Signed)
Starting out your exercise session should last for 30-45 minutes for 5-7 days a week.    Your progression for home exercise is:  Continue walking for 45 min 3 days a week.  Begin to add in an extra day a week.  Start at 30 min on these days.  You can also add in our staff videos to provide some variety. Focus on making sure you are getting in you THR and RPE ranges with walking.  Resistance Training: Begin with body weight exercises (in packet coming in mail).  Do 6-8 exercise of 12 reps each or follow along with one of the weight videos. Add resistance with soup cans and bottles of water before going to your weights.   Exercise at a comfortable pace, using your heart rate and rate of perceived exertion as guides for intensity.  Your target heart rate range is 108-138.

## 2019-04-20 ENCOUNTER — Other Ambulatory Visit: Payer: Self-pay

## 2019-04-20 ENCOUNTER — Encounter: Payer: Medicare Other | Admitting: *Deleted

## 2019-04-20 DIAGNOSIS — Z951 Presence of aortocoronary bypass graft: Secondary | ICD-10-CM

## 2019-04-20 NOTE — Progress Notes (Signed)
Nutrition consultation completed.

## 2019-04-30 ENCOUNTER — Other Ambulatory Visit: Payer: Self-pay

## 2019-04-30 DIAGNOSIS — R079 Chest pain, unspecified: Secondary | ICD-10-CM

## 2019-05-04 ENCOUNTER — Encounter: Payer: Medicare Other | Attending: Cardiovascular Disease | Admitting: *Deleted

## 2019-05-04 ENCOUNTER — Other Ambulatory Visit: Payer: Self-pay

## 2019-05-04 DIAGNOSIS — Z951 Presence of aortocoronary bypass graft: Secondary | ICD-10-CM | POA: Insufficient documentation

## 2019-05-04 NOTE — Progress Notes (Signed)
Virtual visit completed.  Jessica [name] is doing well at home.  She has been walking some, but not consistent enough.  She is eager to come into rehab and has scheduled her orientation. She has also been doing some hand weights at home as well.  She has been aiming for 3-4 days a week for about 10-15 minutes each.    Overall she is doing well.  Her weight has stayed steady and her blood pressures have been doing well.  She does have a follow up scheduled with the surgeon on Wednesday of this week for increased incisional soreness and right sided tightness in her chest.  She is 4 month post surgery so within the normal window for pain, but she does have a chest xray scheduled for that day to make sure nothing else is going on.  She will keep [place] posted.

## 2019-05-05 ENCOUNTER — Other Ambulatory Visit: Payer: Self-pay

## 2019-05-06 ENCOUNTER — Other Ambulatory Visit: Payer: Self-pay | Admitting: *Deleted

## 2019-05-06 ENCOUNTER — Encounter: Payer: Self-pay | Admitting: Surgery

## 2019-05-06 ENCOUNTER — Ambulatory Visit
Admission: RE | Admit: 2019-05-06 | Discharge: 2019-05-06 | Disposition: A | Discharge status: Home / Self Care | Payer: Medicare Other | Source: Ambulatory Visit | Attending: Surgery | Admitting: Surgery

## 2019-05-06 ENCOUNTER — Ambulatory Visit: Payer: Medicare Other | Admitting: Surgery

## 2019-05-06 VITALS — BP 118/75 | HR 63 | Temp 95.4°F | Resp 16 | Ht 66.0 in | Wt 181.0 lb

## 2019-05-06 DIAGNOSIS — R0789 Other chest pain: Secondary | ICD-10-CM | POA: Diagnosis not present

## 2019-05-06 DIAGNOSIS — Z951 Presence of aortocoronary bypass graft: Secondary | ICD-10-CM

## 2019-05-06 DIAGNOSIS — R079 Chest pain, unspecified: Secondary | ICD-10-CM

## 2019-05-06 NOTE — Progress Notes (Signed)
    HPI:  Today for follow-up of her chest discomfort status post coronary bypass graft surgery in March 2020.  She has had persistent discomfort in both sides of her chest wall adjacent to the sternotomy as well as marked sensitivity in the incision which started about a month postop and has been unrelenting.  She said that this is significantly limiting her activity and ability to get sleep at night.  She said that if she sits very still the pain will improve but any activity brings it back.  It has not been exertionally related and does not sound like angina.  She feels well otherwise.  Current Outpatient Medications  Medication Sig Dispense Refill  . aspirin EC 325 MG EC tablet Take 1 tablet (325 mg total) by mouth daily.  0  . Digestive Enzymes (DIGESTIVE ENZYME PO) Take 1 capsule by mouth 2 (two) times daily.     [name] loratadine (CLARITIN) 10 MG tablet Take 10 mg by mouth daily.     . metoprolol tartrate (LOPRESSOR) 25 MG tablet Take 0.5 tablets (12.5 mg total) by mouth 2 (two) times daily. 30 tablet 11  . Multiple Vitamin (MULTI VITAMIN DAILY PO) Take 1 tablet by mouth daily.     . rosuvastatin (CRESTOR) 20 MG tablet Start with 1 tablet three times weekly and increase to 1 tablet daily as tolerated 30 tablet 5   No current facility-administered medications for this visit.      Physical Exam: BP 118/75 (BP Location: Right Arm, Patient Position: Sitting, Cuff Size: Large)   Pulse 63   Temp (!) 95.4 F (35.2 C) Comment: thermal  Resp 16   Ht 5\' 6"  (1.676 m)   Wt 181 lb (82.1 kg)   SpO2 97% Comment: RA  BMI 29.21 kg/m  She looks uncomfortable. The chest incision is well-healed.  There is marked sensitivity along the incision and discomfort on both the right and left side of the sternotomy with palpation. Cardiac exam shows regular rate and rhythm with normal heart sounds. Lungs are clear.  Diagnostic Tests:  Chest x-ray today shows all 7 sternal wires to be intact.  The chest  x-ray is unremarkable otherwise.  Impression:  This patient has persistent chest wall discomfort four months after coronary bypass graft surgery.  I suspect this is probably related to the sternal wires and does not appear to be improving.  It is significantly limiting her lifestyle and at this point I think the best option is to remove the sternal wires to see if this helps.  This is not a common problem but I do see 1-2 patients per year who have had persistent chest wall discomfort and it is always been resolved after sternal wire removal.  I discussed the operative procedure with her including the possibility that she may continue to have some discomfort although I am hopeful that it will resolve.  She understands and would like to proceed.  Plan:  She will be scheduled for sternal wire removal on Monday, 05/11/2019.   Gaye Pollack, MD [facility] [phone]

## 2019-05-07 ENCOUNTER — Other Ambulatory Visit (HOSPITAL_COMMUNITY)
Admission: RE | Admit: 2019-05-07 | Discharge: 2019-05-07 | Disposition: A | Discharge status: Home / Self Care | Payer: Medicare Other | Source: Ambulatory Visit | Attending: Surgery | Admitting: Surgery

## 2019-05-07 ENCOUNTER — Encounter (HOSPITAL_COMMUNITY): Payer: Self-pay | Admitting: *Deleted

## 2019-05-07 DIAGNOSIS — Z1159 Encounter for screening for other viral diseases: Secondary | ICD-10-CM | POA: Insufficient documentation

## 2019-05-07 DIAGNOSIS — R0789 Other chest pain: Secondary | ICD-10-CM

## 2019-05-07 LAB — SARS CORONAVIRUS 2 (TAT 6-24 HRS): SARS Coronavirus 2: NEGATIVE

## 2019-05-08 ENCOUNTER — Encounter: Payer: Medicare Other | Admitting: *Deleted

## 2019-05-08 ENCOUNTER — Encounter: Payer: Self-pay | Admitting: *Deleted

## 2019-05-08 DIAGNOSIS — Z951 Presence of aortocoronary bypass graft: Secondary | ICD-10-CM

## 2019-05-08 NOTE — Progress Notes (Signed)
Orientation call completed. Will have EP/RD and Gym orientation on 7/27 at 11 AM

## 2019-05-10 NOTE — Anesthesia Preprocedure Evaluation (Addendum)
Anesthesia Evaluation  Patient identified by MRN, date of birth, ID band Patient awake    Reviewed: Allergy & Precautions, NPO status , Patient's Chart, lab work & pertinent test results, reviewed documented beta blocker date and time   Airway Mallampati: III  TM Distance: >3 FB Neck ROM: Full    Dental  (+) Missing   Pulmonary neg pulmonary ROS,    Pulmonary exam normal breath sounds clear to auscultation       Cardiovascular + CAD and + CABG  Normal cardiovascular exam Rhythm:Regular Rate:Normal  ECHO: Left Ventricle: The left ventricle has normal systolic function, with an ejection fraction of 60-65%. The cavity size was normal. The left ventricular wall thickness was not assessed.   Neuro/Psych  Headaches, negative psych ROS   GI/Hepatic Neg liver ROS, GERD  ,  Endo/Other  negative endocrine ROS  Renal/GU negative Renal ROS     Musculoskeletal negative musculoskeletal ROS (+)   Abdominal   Peds  Hematology HLD   Anesthesia Other Findings CHEST DISCOMFORT S/P CABG  Reproductive/Obstetrics                            Anesthesia Physical Anesthesia Plan  ASA: III  Anesthesia Plan: General   Post-op Pain Management:    Induction: Intravenous  PONV Risk Score and Plan: 3 and Midazolam, Dexamethasone, Ondansetron and Treatment may vary due to age or medical condition  Airway Management Planned: Oral ETT  Additional Equipment:   Intra-op Plan:   Post-operative Plan: Extubation in OR  Informed Consent: I have reviewed the patients History and Physical, chart, labs and discussed the procedure including the risks, benefits and alternatives for the proposed anesthesia with the patient or authorized representative who has indicated his/her understanding and acceptance.     Dental advisory given  Plan Discussed with: CRNA  Anesthesia Plan Comments:        Anesthesia Quick  Evaluation

## 2019-05-11 ENCOUNTER — Ambulatory Visit (HOSPITAL_COMMUNITY): Payer: Medicare Other | Admitting: Certified Registered"

## 2019-05-11 ENCOUNTER — Encounter (HOSPITAL_COMMUNITY)
Admission: RE | Disposition: A | Discharge status: Home / Self Care | Payer: Self-pay | Source: Home / Self Care | Attending: Surgery

## 2019-05-11 ENCOUNTER — Encounter (HOSPITAL_COMMUNITY): Payer: Self-pay | Admitting: Surgery

## 2019-05-11 ENCOUNTER — Other Ambulatory Visit: Payer: Self-pay

## 2019-05-11 ENCOUNTER — Ambulatory Visit (HOSPITAL_COMMUNITY)
Admission: RE | Admit: 2019-05-11 | Discharge: 2019-05-11 | Disposition: A | Discharge status: Home / Self Care | Payer: Medicare Other | Attending: Surgery | Admitting: Surgery

## 2019-05-11 DIAGNOSIS — Z79899 Other long term (current) drug therapy: Secondary | ICD-10-CM | POA: Diagnosis not present

## 2019-05-11 DIAGNOSIS — I2510 Atherosclerotic heart disease of native coronary artery without angina pectoris: Secondary | ICD-10-CM | POA: Diagnosis not present

## 2019-05-11 DIAGNOSIS — Z882 Allergy status to sulfonamides status: Secondary | ICD-10-CM | POA: Insufficient documentation

## 2019-05-11 DIAGNOSIS — E785 Hyperlipidemia, unspecified: Secondary | ICD-10-CM | POA: Diagnosis not present

## 2019-05-11 DIAGNOSIS — Y832 Surgical operation with anastomosis, bypass or graft as the cause of abnormal reaction of the patient, or of later complication, without mention of misadventure at the time of the procedure: Secondary | ICD-10-CM | POA: Insufficient documentation

## 2019-05-11 DIAGNOSIS — Z8719 Personal history of other diseases of the digestive system: Secondary | ICD-10-CM | POA: Insufficient documentation

## 2019-05-11 DIAGNOSIS — Z951 Presence of aortocoronary bypass graft: Secondary | ICD-10-CM | POA: Diagnosis not present

## 2019-05-11 DIAGNOSIS — K219 Gastro-esophageal reflux disease without esophagitis: Secondary | ICD-10-CM | POA: Diagnosis not present

## 2019-05-11 DIAGNOSIS — Z885 Allergy status to narcotic agent status: Secondary | ICD-10-CM | POA: Insufficient documentation

## 2019-05-11 DIAGNOSIS — T82847A Pain from cardiac prosthetic devices, implants and grafts, initial encounter: Secondary | ICD-10-CM | POA: Insufficient documentation

## 2019-05-11 DIAGNOSIS — R0789 Other chest pain: Secondary | ICD-10-CM | POA: Insufficient documentation

## 2019-05-11 DIAGNOSIS — Z853 Personal history of malignant neoplasm of breast: Secondary | ICD-10-CM | POA: Diagnosis not present

## 2019-05-11 DIAGNOSIS — Z7982 Long term (current) use of aspirin: Secondary | ICD-10-CM | POA: Diagnosis not present

## 2019-05-11 DIAGNOSIS — T85898A Other specified complication of other internal prosthetic devices, implants and grafts, initial encounter: Secondary | ICD-10-CM | POA: Diagnosis not present

## 2019-05-11 HISTORY — DX: Personal history of other medical treatment: Z92.89

## 2019-05-11 HISTORY — PX: STERNAL WIRES REMOVAL: SHX2441

## 2019-05-11 HISTORY — DX: Atherosclerotic heart disease of native coronary artery without angina pectoris: I25.10

## 2019-05-11 LAB — COMPREHENSIVE METABOLIC PANEL
ALT: 47 U/L — ABNORMAL HIGH (ref 0–44)
AST: 48 U/L — ABNORMAL HIGH (ref 15–41)
Albumin: 3.9 g/dL (ref 3.5–5.0)
Alkaline Phosphatase: 68 U/L (ref 38–126)
Anion gap: 10 (ref 5–15)
BUN: 14 mg/dL (ref 8–23)
CO2: 24 mmol/L (ref 22–32)
Calcium: 9.6 mg/dL (ref 8.9–10.3)
Chloride: 103 mmol/L (ref 98–111)
Creatinine, Ser: 0.97 mg/dL (ref 0.44–1.00)
GFR calc Af Amer: >60 mL/min (ref >60)
GFR calc non Af Amer: >60 mL/min (ref >60)
Glucose, Bld: 91 mg/dL (ref 70–99)
Potassium: 3.5 mmol/L (ref 3.5–5.1)
Sodium: 137 mmol/L (ref 135–145)
Total Bilirubin: 0.4 mg/dL (ref 0.3–1.2)
Total Protein: 7.1 g/dL (ref 6.5–8.1)

## 2019-05-11 LAB — CBC
HCT: 42.5 % (ref 36.0–46.0)
Hemoglobin: 12.5 g/dL (ref 12.0–15.0)
MCH: 27.5 pg (ref 26.0–34.0)
MCHC: 29.4 g/dL — ABNORMAL LOW (ref 30.0–36.0)
MCV: 93.4 fL (ref 80.0–100.0)
Platelets: 248 10*3/uL (ref 150–400)
RBC: 4.55 MIL/uL (ref 3.87–5.11)
RDW: 13.9 % (ref 11.5–15.5)
WBC: 5.7 10*3/uL (ref 4.0–10.5)
nRBC: 0 % (ref 0.0–0.2)

## 2019-05-11 SURGERY — REMOVAL, STERNAL WIRE
Anesthesia: General

## 2019-05-11 MED ORDER — SUCCINYLCHOLINE CHLORIDE 20 MG/ML IJ SOLN
INTRAMUSCULAR | Status: DC | PRN
  Administered 2019-05-11: 40 mg via INTRAVENOUS

## 2019-05-11 MED ORDER — PROPOFOL 10 MG/ML IV BOLUS
INTRAVENOUS | Status: DC | PRN
  Administered 2019-05-11: 150 mg via INTRAVENOUS

## 2019-05-11 MED ORDER — METOPROLOL TARTRATE 12.5 MG HALF TABLET: 12.5000 mg | ORAL_TABLET | Freq: Once | ORAL | Status: DC

## 2019-05-11 MED ORDER — MIDAZOLAM HCL 5 MG/5ML IJ SOLN
INTRAMUSCULAR | Status: DC | PRN
  Administered 2019-05-11 (×2): 1 mg via INTRAVENOUS

## 2019-05-11 MED ORDER — SUGAMMADEX SODIUM 200 MG/2ML IV SOLN
INTRAVENOUS | Status: DC | PRN
  Administered 2019-05-11: 200 mg via INTRAVENOUS

## 2019-05-11 MED ORDER — CEFAZOLIN SODIUM-DEXTROSE 2-4 GM/100ML-% IV SOLN
2.0000 g | INTRAVENOUS | Status: AC
  Administered 2019-05-11: 2 g via INTRAVENOUS
  Filled 2019-05-11: qty 100

## 2019-05-11 MED ORDER — 0.9 % SODIUM CHLORIDE (POUR BTL) OPTIME
TOPICAL | Status: DC | PRN
  Administered 2019-05-11: 08:00:00 1000 mL

## 2019-05-11 MED ORDER — MIDAZOLAM HCL 2 MG/2ML IJ SOLN
INTRAMUSCULAR | Status: AC
  Filled 2019-05-11: qty 2

## 2019-05-11 MED ORDER — FENTANYL CITRATE (PF) 100 MCG/2ML IJ SOLN
INTRAMUSCULAR | Status: AC
  Filled 2019-05-11: qty 2

## 2019-05-11 MED ORDER — ONDANSETRON HCL 4 MG/2ML IJ SOLN
INTRAMUSCULAR | Status: DC | PRN
  Administered 2019-05-11: 4 mg via INTRAVENOUS

## 2019-05-11 MED ORDER — PROPOFOL 10 MG/ML IV BOLUS
INTRAVENOUS | Status: AC
  Filled 2019-05-11: qty 20

## 2019-05-11 MED ORDER — SODIUM CHLORIDE 0.9 % IV SOLN
INTRAVENOUS | Status: DC | PRN
  Administered 2019-05-11: 50 ug/min via INTRAVENOUS

## 2019-05-11 MED ORDER — FENTANYL CITRATE (PF) 100 MCG/2ML IJ SOLN
25.0000 ug | INTRAMUSCULAR | Status: DC | PRN
  Administered 2019-05-11: 25 ug via INTRAVENOUS

## 2019-05-11 MED ORDER — ROCURONIUM BROMIDE 10 MG/ML (PF) SYRINGE
PREFILLED_SYRINGE | INTRAVENOUS | Status: DC | PRN
  Administered 2019-05-11: 30 mg via INTRAVENOUS

## 2019-05-11 MED ORDER — FENTANYL CITRATE (PF) 250 MCG/5ML IJ SOLN
INTRAMUSCULAR | Status: AC
  Filled 2019-05-11: qty 5

## 2019-05-11 MED ORDER — EPHEDRINE SULFATE-NACL 50-0.9 MG/10ML-% IV SOSY
PREFILLED_SYRINGE | INTRAVENOUS | Status: DC | PRN
  Administered 2019-05-11 (×2): 5 mg via INTRAVENOUS

## 2019-05-11 MED ORDER — ONDANSETRON HCL 4 MG/2ML IJ SOLN: 4.0000 mg | Freq: Once | INTRAMUSCULAR | Status: DC | PRN

## 2019-05-11 MED ORDER — ACETAMINOPHEN 500 MG PO TABS
1000.0000 mg | ORAL_TABLET | Freq: Once | ORAL | Status: AC
  Administered 2019-05-11: 06:00:00 1000 mg via ORAL
  Filled 2019-05-11: qty 2

## 2019-05-11 MED ORDER — LACTATED RINGERS IV SOLN
INTRAVENOUS | Status: DC | PRN
  Administered 2019-05-11: 07:00:00 via INTRAVENOUS

## 2019-05-11 MED ORDER — DEXAMETHASONE SODIUM PHOSPHATE 10 MG/ML IJ SOLN
INTRAMUSCULAR | Status: DC | PRN
  Administered 2019-05-11: 10 mg via INTRAVENOUS

## 2019-05-11 MED ORDER — LIDOCAINE 2% (20 MG/ML) 5 ML SYRINGE
INTRAMUSCULAR | Status: DC | PRN
  Administered 2019-05-11: 60 mg via INTRAVENOUS

## 2019-05-11 MED ORDER — OXYCODONE HCL 5 MG PO TABS: 5.0000 mg | ORAL_TABLET | Freq: Three times a day (TID) | ORAL | 0 refills | Status: DC | PRN

## 2019-05-11 MED ORDER — FENTANYL CITRATE (PF) 250 MCG/5ML IJ SOLN
INTRAMUSCULAR | Status: DC | PRN
  Administered 2019-05-11: 100 ug via INTRAVENOUS
  Administered 2019-05-11: 50 ug via INTRAVENOUS

## 2019-05-11 MED ORDER — PHENYLEPHRINE 40 MCG/ML (10ML) SYRINGE FOR IV PUSH (FOR BLOOD PRESSURE SUPPORT)
PREFILLED_SYRINGE | INTRAVENOUS | Status: DC | PRN
  Administered 2019-05-11: 80 ug via INTRAVENOUS

## 2019-05-11 SURGICAL SUPPLY — 63 items
ATTRACTOMAT 16X20 MAGNETIC DRP (DRAPES) ×3 IMPLANT
BAG DECANTER FOR FLEXI CONT (MISCELLANEOUS) ×1 IMPLANT
BINDER BREAST LRG (GAUZE/BANDAGES/DRESSINGS) ×4 IMPLANT
BLADE SURG 10 STRL SS (BLADE) ×4 IMPLANT
BNDG GAUZE ELAST 4 BULKY (GAUZE/BANDAGES/DRESSINGS) IMPLANT
CANISTER SUCT 3000ML PPV (MISCELLANEOUS) ×3 IMPLANT
CATH THORACIC 28FR RT ANG (CATHETERS) IMPLANT
CATH THORACIC 36FR (CATHETERS) IMPLANT
CATH THORACIC 36FR RT ANG (CATHETERS) IMPLANT
CLIP VESOCCLUDE SM WIDE 24/CT (CLIP) IMPLANT
CONT SPEC 4OZ CLIKSEAL STRL BL (MISCELLANEOUS) IMPLANT
COVER SURGICAL LIGHT HANDLE (MISCELLANEOUS) ×4 IMPLANT
COVER WAND RF STERILE (DRAPES) ×3 IMPLANT
DERMABOND ADHESIVE PROPEN (GAUZE/BANDAGES/DRESSINGS) ×2
DERMABOND ADVANCED .7 DNX6 (GAUZE/BANDAGES/DRESSINGS) IMPLANT
DRAPE LAPAROSCOPIC ABDOMINAL (DRAPES) ×3 IMPLANT
DRAPE SLUSH/WARMER DISC (DRAPES) IMPLANT
DRSG COVADERM 4X8 (GAUZE/BANDAGES/DRESSINGS) ×2 IMPLANT
ELECT REM PT RETURN 9FT ADLT (ELECTROSURGICAL) ×3
ELECTRODE REM PT RTRN 9FT ADLT (ELECTROSURGICAL) ×1 IMPLANT
GAUZE SPONGE 4X4 12PLY STRL (GAUZE/BANDAGES/DRESSINGS) ×3 IMPLANT
GAUZE SPONGE 4X4 12PLY STRL LF (GAUZE/BANDAGES/DRESSINGS) ×2 IMPLANT
GAUZE XEROFORM 5X9 LF (GAUZE/BANDAGES/DRESSINGS) IMPLANT
GLOVE BIOGEL M 6.5 STRL (GLOVE) ×2 IMPLANT
GLOVE BIOGEL PI IND STRL 6.5 (GLOVE) IMPLANT
GLOVE BIOGEL PI INDICATOR 6.5 (GLOVE) ×6
GLOVE EUDERMIC 7 POWDERFREE (GLOVE) ×3 IMPLANT
GOWN STRL REUS W/ TWL LRG LVL3 (GOWN DISPOSABLE) ×4 IMPLANT
GOWN STRL REUS W/ TWL XL LVL3 (GOWN DISPOSABLE) ×1 IMPLANT
GOWN STRL REUS W/TWL LRG LVL3 (GOWN DISPOSABLE) ×6
GOWN STRL REUS W/TWL XL LVL3 (GOWN DISPOSABLE) ×2
HANDPIECE INTERPULSE COAX TIP (DISPOSABLE)
HEMOSTAT POWDER SURGIFOAM 1G (HEMOSTASIS) ×2 IMPLANT
KIT BASIN OR (CUSTOM PROCEDURE TRAY) ×3 IMPLANT
KIT SUCTION CATH 14FR (SUCTIONS) IMPLANT
KIT TURNOVER KIT B (KITS) ×3 IMPLANT
MARKER SKIN DUAL TIP RULER LAB (MISCELLANEOUS) IMPLANT
NS IRRIG 1000ML POUR BTL (IV SOLUTION) ×3 IMPLANT
PACK CHEST (CUSTOM PROCEDURE TRAY) ×3 IMPLANT
PAD ARMBOARD 7.5X6 YLW CONV (MISCELLANEOUS) ×6 IMPLANT
PIN SAFETY STERILE (MISCELLANEOUS) IMPLANT
RUBBERBAND STERILE (MISCELLANEOUS) IMPLANT
SET HNDPC FAN SPRY TIP SCT (DISPOSABLE) IMPLANT
SOL PREP POV-IOD 4OZ 10% (MISCELLANEOUS) IMPLANT
SPONGE LAP 18X18 RF (DISPOSABLE) ×1 IMPLANT
STRAP MONTGOMERY 1.25X11-1/8 (MISCELLANEOUS) IMPLANT
SUT STEEL 6MS V (SUTURE) IMPLANT
SUT STEEL STERNAL CCS#1 18IN (SUTURE) IMPLANT
SUT STEEL SZ 6 DBL 3X14 BALL (SUTURE) IMPLANT
SUT VIC AB 1 CTX 36 (SUTURE) ×4
SUT VIC AB 1 CTX36XBRD ANBCTR (SUTURE) IMPLANT
SUT VIC AB 2-0 CTX 36 (SUTURE) ×4 IMPLANT
SUT VIC AB 3-0 SH 27 (SUTURE) ×4
SUT VIC AB 3-0 SH 27X BRD (SUTURE) IMPLANT
SUT VIC AB 3-0 X1 27 (SUTURE) ×4 IMPLANT
SWAB COLLECTION DEVICE MRSA (MISCELLANEOUS) IMPLANT
SWAB CULTURE ESWAB REG 1ML (MISCELLANEOUS) IMPLANT
SYSTEM SAHARA CHEST DRAIN ATS (WOUND CARE) ×1 IMPLANT
SYSTEM SAHARA CHEST DRAIN RE-I (WOUND CARE) ×2 IMPLANT
TOWEL GREEN STERILE (TOWEL DISPOSABLE) ×3 IMPLANT
TOWEL GREEN STERILE FF (TOWEL DISPOSABLE) ×3 IMPLANT
TRAY FOLEY MTR SLVR 14FR STAT (SET/KITS/TRAYS/PACK) ×1 IMPLANT
WATER STERILE IRR 1000ML POUR (IV SOLUTION) ×3 IMPLANT

## 2019-05-11 NOTE — Op Note (Signed)
  CARDIOVASCULAR SURGERY OPERATIVE NOTE  05/11/2019  Surgeon:  Gaye Pollack, MD  First Assistant: None   Preoperative Diagnosis:  Persistent chest wall pain s/p CABG   Postoperative Diagnosis:  Same   Procedure:  1. Removal of all sternal wires.   Anesthesia:  General Endotracheal   Clinical History/Surgical Indication:  This patient has persistent chest wall discomfortfourmonths after coronary bypass graft surgery. I suspect this is probably related to the sternal wires and does not appear to be improving. It is significantly limiting her lifestyle andat this point I think the best option is to remove the sternal wires to see if this helps. This is not a common problem but I do see 1-2 patients per year who have had persistent chest wall discomfort and it is always beenresolved after sternal wire removal. I discussed the operative procedure with her including the possibility that she may continue to have some discomfort although I am hopeful that it will resolve. She understands and would like to proceed.  Preparation:  The patient was seen in the preoperative holding area and the correct patient, correct operation were confirmed with the patient after reviewing the medical record. The consent was signed by me. Preoperative antibiotics were given. The patient was taken back to the operating room and positioned supine on the operating room table. After being placed under general endotracheal anesthesia by the anesthesia team the neck and chest were prepped with betadine soap and solution and draped in the usual sterile manner. A surgical time-out was taken and the correct patient and operative procedure were confirmed with the nursing and anesthesia staff.  Removal of Sternal Wires:  I made 3 incisions 1 to 2 cm in length along the previous sternotomy scar.  Through each incision the  subcutaneous tissue was divided using electrocautery down to the sternum.  The sternal wires were located, untwisted, and removed in entirety.  There were 7 wires.  There is complete hemostasis.  The subcutaneous tissue was reapproximated with interrupted 3-0 Vicryl suture.  The skin was closed with a 3-0 Vicryl subcuticular suture.  Dermabond was applied to the incisions.  A dry sterile dressing was applied over the incisions.  The sponge needle and instrument counts were correct according scrub nurse.  Patient was then awakened and extubated and transported to the postanesthesia care unit in stable condition.

## 2019-05-11 NOTE — H&P (Signed)
[address]       [place] [address]             [phone]      Cardiothoracic Surgery History and Physical  Jessica [name] is an 68 y.o. female.   Chief Complaint: Pain from sternal wires HPI:   The patient is a 68 year old woman who is about 4 months following coronary bypass graft surgery.  She has had persistent discomfort in both sides of her chest wall adjacent to the sternotomy as well as marked sensitivity in the incision which started about a month postop and has been unrelenting.  She said that this is significantly limiting her activity and ability to get sleep at night.  She said that if she sits very still the pain will improve but any activity brings it back.  It has not been exertionally related and does not sound like angina.  She feels well otherwise.    Past Medical History:  Diagnosis Date  . Allergy   . Breast cancer ([place]) 2000  . Cancer [facility])    breast  . Colon polyp   . Coronary artery disease   . GERD (gastroesophageal reflux disease)   . History of blood transfusion   . Hyperlipidemia   . Migraine    history of migrarines, none as an adult  . Personal history of radiation therapy 2000    Past Surgical History:  Procedure Laterality Date  . ABDOMINAL HYSTERECTOMY    . BREAST LUMPECTOMY Right 2000  . BREAST SURGERY Right    calcifaction removed  . BUNIONECTOMY Bilateral   . COLONOSCOPY W/ POLYPECTOMY    . CORONARY ARTERY BYPASS GRAFT N/A 01/05/2019   Procedure: CORONARY ARTERY BYPASS GRAFTING (CABG), using right leg saphenous endoscopic and open vein harvest, exploration left leg;  Surgeon: Gaye Pollack, MD;  Location: [facility];  Service: Open Heart Surgery;  Laterality: N/A;  . EYE SURGERY Bilateral    cataract  . hemorrhoid    . LEFT HEART CATH AND CORONARY ANGIOGRAPHY N/A 01/01/2019   Procedure: LEFT HEART CATH AND CORONARY ANGIOGRAPHY;  Surgeon: Jettie Booze, MD;  Location: [facility];  Service:  Cardiovascular;  Laterality: N/A;  . TEE WITHOUT CARDIOVERSION N/A 01/05/2019   Procedure: TRANSESOPHAGEAL ECHOCARDIOGRAM (TEE);  Surgeon: Gaye Pollack, MD;  Location: [facility];  Service: Open Heart Surgery;  Laterality: N/A;    Family History  Problem Relation Age of Onset  . Heart disease Brother   . Asthma Paternal Aunt   . Diabetes Paternal Aunt   . Stroke Paternal Uncle   . Kidney disease Paternal Uncle    Social History:  reports that she has never smoked. She has never used smokeless tobacco. She reports that she does not drink alcohol or use drugs.  Allergies:  Allergies  Allergen Reactions  . Codeine Nausea And Vomiting  . Sulfa Antibiotics Swelling  . Sulfacetamide Swelling    Medications Prior to Admission  Medication Sig Dispense Refill  . Ascorbic Acid (VITAMIN C) 1000 MG tablet Take 2,000 mg by mouth daily.    [name] aspirin EC 325 MG EC tablet Take 1 tablet (325 mg total) by mouth daily. (Patient taking differently: Take 325 mg by mouth at bedtime. )  0  . Cholecalciferol (VITAMIN D-3) 125 MCG (5000 UT) TABS Take 5,000 Units by mouth daily.    . Digestive Enzymes (DIGESTIVE ENZYME PO) Take 1 capsule by mouth 2 (two) times daily.     [name]  loratadine (CLARITIN) 10 MG tablet Take 10 mg by mouth every evening.     . metoprolol tartrate (LOPRESSOR) 25 MG tablet Take 0.5 tablets (12.5 mg total) by mouth 2 (two) times daily. 30 tablet 11  . Multiple Vitamin (MULTIVITAMIN WITH MINERALS) TABS tablet Take 1 tablet by mouth daily.    . rosuvastatin (CRESTOR) 20 MG tablet Start with 1 tablet three times weekly and increase to 1 tablet daily as tolerated (Patient taking differently: Take 20 mg by mouth 3 (three) times a week. ) 30 tablet 5    Results for orders placed or performed during the hospital encounter of 05/11/19 (from the past 48 hour(s))  CBC     Status: Abnormal   Collection Time: 05/11/19  6:45 AM  Result Value Ref Range   WBC 5.7 4.0 - 10.5 K/uL   RBC 4.55 3.87 - 5.11  MIL/uL   Hemoglobin 12.5 12.0 - 15.0 g/dL   HCT 42.5 36.0 - 46.0 %   MCV 93.4 80.0 - 100.0 fL   MCH 27.5 26.0 - 34.0 pg   MCHC 29.4 (L) 30.0 - 36.0 g/dL   RDW 13.9 11.5 - 15.5 %   Platelets 248 150 - 400 K/uL   nRBC 0.0 0.0 - 0.2 %    Comment: Performed at [facility], [place] [address], [place], [place] [address]  Comprehensive metabolic panel     Status: Abnormal   Collection Time: 05/11/19  6:45 AM  Result Value Ref Range   Sodium 137 135 - 145 mmol/L   Potassium 3.5 3.5 - 5.1 mmol/L   Chloride 103 98 - 111 mmol/L   CO2 24 22 - 32 mmol/L   Glucose, Bld 91 70 - 99 mg/dL   BUN 14 8 - 23 mg/dL   Creatinine, Ser 0.97 0.44 - 1.00 mg/dL   Calcium 9.6 8.9 - 10.3 mg/dL   Total Protein 7.1 6.5 - 8.1 g/dL   Albumin 3.9 3.5 - 5.0 g/dL   AST 48 (H) 15 - 41 U/L   ALT 47 (H) 0 - 44 U/L   Alkaline Phosphatase 68 38 - 126 U/L   Total Bilirubin 0.4 0.3 - 1.2 mg/dL   GFR calc non Af Amer >60 >60 mL/min   GFR calc Af Amer >60 >60 mL/min   Anion gap 10 5 - 15    Comment: Performed at [facility] [address] [place], [place] [address]   No results found.  Review of Systems  Constitutional: Negative.   HENT: Negative.   Eyes: Negative.   Respiratory: Negative.   Cardiovascular:       Chest wall pain  Gastrointestinal: Negative.   Genitourinary: Negative.   Musculoskeletal: Negative.   Skin: Negative.   Neurological: Negative.   Endo/Heme/Allergies: Negative.   Psychiatric/Behavioral: Negative.     Blood pressure (!) 142/89, pulse (!) 55, temperature 98.2 F (36.8 C), resp. rate 18, SpO2 98 %. Physical Exam  Constitutional: She is oriented to person, place, and time. She appears well-developed and well-nourished. No distress.  HENT:  Head: Normocephalic and atraumatic.  Mouth/Throat: Oropharynx is clear and moist.  Eyes: Pupils are equal, round, and reactive to light. EOM are normal.  Neck: Normal range of motion. Neck supple.  Cardiovascular: Normal rate,  regular rhythm and normal heart sounds.  No murmur heard. Respiratory: Effort normal and breath sounds normal. She exhibits tenderness.  GI: Soft. Bowel sounds are normal. She exhibits no distension. There is no abdominal tenderness.  Musculoskeletal:        General: No edema.  Neurological: She is alert and oriented to person, place, and time.  Skin: Skin is warm and dry.  Psychiatric: She has a normal mood and affect.     Assessment/Plan  This patient has persistent chest wall discomfort four months after coronary bypass graft surgery.  I suspect this is probably related to the sternal wires and does not appear to be improving.  It is significantly limiting her lifestyle and at this point I think the best option is to remove the sternal wires to see if this helps.  This is not a common problem but I do see 1-2 patients per year who have had persistent chest wall discomfort and it is always been resolved after sternal wire removal.  I discussed the operative procedure with her including the possibility that she may continue to have some discomfort although I am hopeful that it will resolve.  She understands and would like to proceed.  Gaye Pollack, MD 05/11/2019, 7:20 AM

## 2019-05-11 NOTE — Brief Op Note (Signed)
05/11/2019  8:55 AM  PATIENT:  Jessica [name]  68 y.o. female  PRE-OPERATIVE DIAGNOSIS:  CHEST DISCOMFORT S/P CABG  POST-OPERATIVE DIAGNOSIS:  CHEST DISCOMFORT S/P CABG  PROCEDURE:  Procedure(s): STERNAL WIRES REMOVAL (N/A)  SURGEON:  Surgeon(s) and Role:    * Bartle, Fernande Boyden, MD - Primary   ANESTHESIA:   general  EBL:  15 mL   BLOOD ADMINISTERED:none  DRAINS: none   LOCAL MEDICATIONS USED:  NONE  SPECIMEN:  No Specimen  DISPOSITION OF SPECIMEN:  N/A  COUNTS:  YES  TOURNIQUET:  * No tourniquets in log *  DICTATION: .Note written in EPIC  PLAN OF CARE: Discharge to home after PACU  PATIENT DISPOSITION:  PACU - hemodynamically stable.   Delay start of Pharmacological VTE agent (>24hrs) due to surgical blood loss or risk of bleeding: not applicable

## 2019-05-11 NOTE — Discharge Instructions (Addendum)
May remove dressing tomorrow and keep open. Incision is covered with Dermabond surgical adhesive. It is waterproof so you may shower. Do not apply creams or lotions etc to the incision. It will dissolve the Dermabond. May return to driving tomorrow. No lifting more than 10 lbs for 1 week.

## 2019-05-11 NOTE — Anesthesia Postprocedure Evaluation (Signed)
Anesthesia Post Note  Patient: Jessica [name]  Procedure(s) Performed: STERNAL WIRES REMOVAL (N/A )     Patient location during evaluation: PACU Anesthesia Type: General Level of consciousness: awake and alert Pain management: pain level controlled Vital Signs Assessment: post-procedure vital signs reviewed and stable Respiratory status: spontaneous breathing, nonlabored ventilation, respiratory function stable and patient connected to nasal cannula oxygen Cardiovascular status: blood pressure returned to baseline and stable Postop Assessment: no apparent nausea or vomiting Anesthetic complications: no    Last Vitals:  Vitals:   05/11/19 0930 05/11/19 0945  BP: 123/77 127/81  Pulse: 72 69  Resp: 13 20  Temp:  36.4 C  SpO2: 92% 97%    Last Pain:  Vitals:   05/11/19 0945  PainSc: 3                  Ryan P Ellender

## 2019-05-11 NOTE — Interval H&P Note (Signed)
History and Physical Interval Note:  05/11/2019 7:24 AM  Jessica [name]  has presented today for surgery, with the diagnosis of CHEST DISCOMFORT S/P CABG.  The various methods of treatment have been discussed with the patient and family. After consideration of risks, benefits and other options for treatment, the patient has consented to  Procedure(s): STERNAL WIRES REMOVAL (N/A) as a surgical intervention.  The patient's history has been reviewed, patient examined, no change in status, stable for surgery.  I have reviewed the patient's chart and labs.  Questions were answered to the patient's satisfaction.     Gaye Pollack

## 2019-05-11 NOTE — Transfer of Care (Signed)
Immediate Anesthesia Transfer of Care Note  Patient: Jessica [name]  Procedure(s) Performed: STERNAL WIRES REMOVAL (N/A )  Patient Location: PACU  Anesthesia Type:General  Level of Consciousness: awake, alert  and oriented  Airway & Oxygen Therapy: Patient Spontanous Breathing and Patient connected to face mask oxygen  Post-op Assessment: Report given to RN, Post -op Vital signs reviewed and stable and Patient moving all extremities X 4  Post vital signs: Reviewed and stable  Last Vitals:  Vitals Value Taken Time  BP 145/82 05/11/19 0901  Temp    Pulse 66 05/11/19 0901  Resp 15 05/11/19 0901  SpO2 100 % 05/11/19 0901  Vitals shown include unvalidated device data.  Last Pain:  Vitals:   05/11/19 0601  PainSc: 0-No pain      Patients Stated Pain Goal: 5 (62/83/15 1761)  Complications: No apparent anesthesia complications

## 2019-05-11 NOTE — Anesthesia Procedure Notes (Signed)
Procedure Name: Intubation Date/Time: 05/11/2019 7:36 AM Performed by: Gaylene Brooks, CRNA Pre-anesthesia Checklist: Patient identified, Emergency Drugs available, Suction available and Patient being monitored Patient Re-evaluated:Patient Re-evaluated prior to induction Oxygen Delivery Method: Circle System Utilized Preoxygenation: Pre-oxygenation with 100% oxygen Induction Type: IV induction Ventilation: Mask ventilation without difficulty Laryngoscope Size: Miller and 2 Grade View: Grade II Tube type: Oral Tube size: 7.0 mm Number of attempts: 1 Airway Equipment and Method: Stylet and Oral airway Placement Confirmation: ETT inserted through vocal cords under direct vision,  positive ETCO2 and breath sounds checked- equal and bilateral Secured at: 22 cm Tube secured with: Tape Dental Injury: Teeth and Oropharynx as per pre-operative assessment

## 2019-05-12 ENCOUNTER — Encounter (HOSPITAL_COMMUNITY): Payer: Self-pay | Admitting: Surgery

## 2019-05-12 ENCOUNTER — Ambulatory Visit: Payer: Medicare Other

## 2019-05-15 ENCOUNTER — Telehealth: Payer: Self-pay | Admitting: Physician Assistant

## 2019-05-15 ENCOUNTER — Other Ambulatory Visit: Payer: Self-pay | Admitting: Internal Medicine

## 2019-05-15 DIAGNOSIS — Z1231 Encounter for screening mammogram for malignant neoplasm of breast: Secondary | ICD-10-CM

## 2019-05-15 NOTE — Telephone Encounter (Signed)
New Message           Patient called in today to see if Jessica [name] is suppose to come back to see Almyra Deforest in 3 months? Pls advise

## 2019-05-18 ENCOUNTER — Encounter: Payer: Medicare Other | Admitting: *Deleted

## 2019-05-18 ENCOUNTER — Other Ambulatory Visit: Payer: Self-pay

## 2019-05-18 VITALS — Ht 66.0 in | Wt 184.0 lb

## 2019-05-18 DIAGNOSIS — Z951 Presence of aortocoronary bypass graft: Secondary | ICD-10-CM

## 2019-05-18 NOTE — Progress Notes (Signed)
Cardiac Individual Treatment Plan  Patient Details  Name: Jessica [name] MRN: [id number] Date of Birth: 28-Apr-1951 Referring Provider:     Cardiac Rehab from 05/18/2019 in [facility] Cardiac and Pulmonary Rehab  Referring Provider  Quay Burow MD      Initial Encounter Date:    Cardiac Rehab from 05/18/2019 in [facility] Cardiac and Pulmonary Rehab  Date  05/18/19      Visit Diagnosis: S/P CABG x 5   Patient's Home Medications on Admission:  Current Outpatient Medications:  .  Ascorbic Acid (VITAMIN C) 1000 MG tablet, Take 2,000 mg by mouth daily., Disp: , Rfl:  .  aspirin EC 325 MG EC tablet, Take 1 tablet (325 mg total) by mouth daily. (Patient taking differently: Take 325 mg by mouth at bedtime. ), Disp: , Rfl: 0 .  Cholecalciferol (VITAMIN D-3) 125 MCG (5000 UT) TABS, Take 5,000 Units by mouth daily., Disp: , Rfl:  .  Digestive Enzymes (DIGESTIVE ENZYME PO), Take 1 capsule by mouth 2 (two) times daily. , Disp: , Rfl:  .  loratadine (CLARITIN) 10 MG tablet, Take 10 mg by mouth every evening. , Disp: , Rfl:  .  metoprolol tartrate (LOPRESSOR) 25 MG tablet, Take 0.5 tablets (12.5 mg total) by mouth 2 (two) times daily., Disp: 30 tablet, Rfl: 11 .  Multiple Vitamin (MULTIVITAMIN WITH MINERALS) TABS tablet, Take 1 tablet by mouth daily., Disp: , Rfl:  .  oxyCODONE (ROXICODONE) 5 MG immediate release tablet, Take 1 tablet (5 mg total) by mouth every 8 (eight) hours as needed for moderate pain., Disp: 20 tablet, Rfl: 0 .  rosuvastatin (CRESTOR) 20 MG tablet, Start with 1 tablet three times weekly and increase to 1 tablet daily as tolerated (Patient taking differently: Take 20 mg by mouth 3 (three) times a week. ), Disp: 30 tablet, Rfl: 5  Past Medical History: Past Medical History:  Diagnosis Date  . Allergy   . Breast cancer ([place]) 2000  . Cancer [facility])    breast  . Colon polyp   . Coronary artery disease   . GERD (gastroesophageal reflux disease)   . History of blood transfusion   .  Hyperlipidemia   . Migraine    history of migrarines, none as an adult  . Personal history of radiation therapy 2000    Tobacco Use: Social History   Tobacco Use  Smoking Status Never Smoker  Smokeless Tobacco Never Used    Labs: Recent Review Flowsheet Data    Labs for ITP Cardiac and Pulmonary Rehab Latest Ref Rng & Units 01/05/2019 01/05/2019 01/05/2019 01/05/2019 03/12/2019   Cholestrol 100 - 199 mg/dL - - - - 113   LDLCALC 0 - 99 mg/dL - - - - 63   HDL >39 mg/dL - - - - 33(L)   Trlycerides 0 - 149 mg/dL - - - - 83   Hemoglobin A1c 4.8 - 5.6 % - - - - -   PHART 7.350 - 7.450 7.468(H) 7.380 7.315(L) 7.265(L) -   PCO2ART 32.0 - 48.0 mmHg 32.1 38.0 47.1 49.9(H) -   HCO3 20.0 - 28.0 mmol/L 23.3 22.9 23.9 22.6 -   TCO2 22 - 32 mmol/L _0 -   ACIDBASEDEF 0.0 - 2.0 mmol/L - 3.0(H) 2.0 4.0(H) -   O2SAT % 100.0 99.0 97.0 98.0 -       Exercise Target Goals: Exercise Program Goal: Individual exercise prescription set using results from initial 6 min walk test and THRR while considering  patient's activity barriers and safety.   Exercise Prescription Goal: Initial exercise prescription builds to 30-45 minutes a day of aerobic activity, 2-3 days per week.  Home exercise guidelines will be given to patient during program as part of exercise prescription that the participant will acknowledge.  Activity Barriers & Risk Stratification: Activity Barriers & Cardiac Risk Stratification - 05/18/19 1217      Activity Barriers & Cardiac Risk Stratification   Activity Barriers  Deconditioning;Muscular Weakness;Incisional Pain   sternal wire removed on 05/11/19   Cardiac Risk Stratification  High       6 Minute Walk: 6 Minute Walk    Row Name 05/18/19 1216         6 Minute Walk   Phase  Initial     Distance  1770 feet     Walk Time  6 minutes     # of Rest Breaks  0     MPH  3.35     METS  3.86     RPE  7     VO2 Peak  13.49     Symptoms  No     Resting HR  73 bpm      Resting BP  136/70     Resting Oxygen Saturation   99 %     Exercise Oxygen Saturation  during 6 min walk  99 %     Max Ex. HR  107 bpm     Max Ex. BP  144/76     2 Minute Post BP  136/70        Oxygen Initial Assessment:   Oxygen Re-Evaluation:   Oxygen Discharge (Final Oxygen Re-Evaluation):   Initial Exercise Prescription: Initial Exercise Prescription - 05/18/19 1200      Date of Initial Exercise RX and Referring Provider   Date  05/18/19    Referring Provider  Quay Burow MD      Treadmill   MPH  3.3    Grade  2    Minutes  15    METs  4.21      NuStep   Level  3    SPM  80    Minutes  15    METs  3      Recumbant Elliptical   Level  2    RPM  50    Minutes  15    METs  3      Prescription Details   Frequency (times per week)  3    Duration  Progress to 30 minutes of continuous aerobic without signs/symptoms of physical distress      Intensity   THRR 40-80% of Max Heartrate  105-136    Ratings of Perceived Exertion  11-13    Perceived Dyspnea  0-4      Progression   Progression  Continue to progress workloads to maintain intensity without signs/symptoms of physical distress.      Resistance Training   Training Prescription  Yes    Weight  4 lbs    Reps  10-15       Perform Capillary Blood Glucose checks as needed.  Exercise Prescription Changes:  Exercise Prescription Changes    Row Name 05/18/19 1200             Response to Exercise   Blood Pressure (Admit)  136/70       Blood Pressure (Exercise)  144/76       Blood Pressure (Exit)  136/70  Heart Rate (Admit)  73 bpm       Heart Rate (Exercise)  107 bpm       Heart Rate (Exit)  66 bpm       Oxygen Saturation (Admit)  99 %       Oxygen Saturation (Exercise)  99 %       Rating of Perceived Exertion (Exercise)  7       Symptoms  none       Comments  walk test results          Exercise Comments:   Exercise Goals and Review:  Exercise Goals    Row Name 04/15/19  1521 05/18/19 1219           Exercise Goals   Increase Physical Activity  Yes  Yes      Intervention  Provide advice, education, support and counseling about physical activity/exercise needs.;Develop an individualized exercise prescription for aerobic and resistive training based on initial evaluation findings, risk stratification, comorbidities and participant's personal goals.  Provide advice, education, support and counseling about physical activity/exercise needs.;Develop an individualized exercise prescription for aerobic and resistive training based on initial evaluation findings, risk stratification, comorbidities and participant's personal goals.      Expected Outcomes  Short Term: Attend rehab on a regular basis to increase amount of physical activity.;Long Term: Add in home exercise to make exercise part of routine and to increase amount of physical activity.;Long Term: Exercising regularly at least 3-5 days a week.  Short Term: Attend rehab on a regular basis to increase amount of physical activity.;Long Term: Add in home exercise to make exercise part of routine and to increase amount of physical activity.;Long Term: Exercising regularly at least 3-5 days a week.      Increase Strength and Stamina  Yes  Yes      Intervention  Provide advice, education, support and counseling about physical activity/exercise needs.;Develop an individualized exercise prescription for aerobic and resistive training based on initial evaluation findings, risk stratification, comorbidities and participant's personal goals.  Provide advice, education, support and counseling about physical activity/exercise needs.;Develop an individualized exercise prescription for aerobic and resistive training based on initial evaluation findings, risk stratification, comorbidities and participant's personal goals.      Expected Outcomes  Short Term: Increase workloads from initial exercise prescription for resistance, speed, and  METs.;Short Term: Perform resistance training exercises routinely during rehab and add in resistance training at home;Long Term: Improve cardiorespiratory fitness, muscular endurance and strength as measured by increased METs and functional capacity (6MWT)  Short Term: Increase workloads from initial exercise prescription for resistance, speed, and METs.;Short Term: Perform resistance training exercises routinely during rehab and add in resistance training at home;Long Term: Improve cardiorespiratory fitness, muscular endurance and strength as measured by increased METs and functional capacity (6MWT)      Able to understand and use rate of perceived exertion (RPE) scale  Yes  Yes      Intervention  Provide education and explanation on how to use RPE scale  Provide education and explanation on how to use RPE scale      Expected Outcomes  Short Term: Able to use RPE daily in rehab to express subjective intensity level;Long Term:  Able to use RPE to guide intensity level when exercising independently  Short Term: Able to use RPE daily in rehab to express subjective intensity level;Long Term:  Able to use RPE to guide intensity level when exercising independently      Able to  understand and use Dyspnea scale  Yes  Yes      Intervention  Provide education and explanation on how to use Dyspnea scale  Provide education and explanation on how to use Dyspnea scale      Expected Outcomes  Short Term: Able to use Dyspnea scale daily in rehab to express subjective sense of shortness of breath during exertion;Long Term: Able to use Dyspnea scale to guide intensity level when exercising independently  Short Term: Able to use Dyspnea scale daily in rehab to express subjective sense of shortness of breath during exertion;Long Term: Able to use Dyspnea scale to guide intensity level when exercising independently      Knowledge and understanding of Target Heart Rate Range (THRR)  Yes  Yes      Intervention  Provide education  and explanation of THRR including how the numbers were predicted and where they are located for reference  Provide education and explanation of THRR including how the numbers were predicted and where they are located for reference      Expected Outcomes  Short Term: Able to state/look up THRR;Short Term: Able to use daily as guideline for intensity in rehab;Long Term: Able to use THRR to govern intensity when exercising independently  Short Term: Able to state/look up THRR;Short Term: Able to use daily as guideline for intensity in rehab;Long Term: Able to use THRR to govern intensity when exercising independently      Able to check pulse independently  Yes  Yes      Intervention  Provide education and demonstration on how to check pulse in carotid and radial arteries.;Review the importance of being able to check your own pulse for safety during independent exercise  Provide education and demonstration on how to check pulse in carotid and radial arteries.;Review the importance of being able to check your own pulse for safety during independent exercise      Expected Outcomes  Short Term: Able to explain why pulse checking is important during independent exercise;Long Term: Able to check pulse independently and accurately  Short Term: Able to explain why pulse checking is important during independent exercise;Long Term: Able to check pulse independently and accurately      Understanding of Exercise Prescription  Yes  Yes      Intervention  Provide education, explanation, and written materials on patient's individual exercise prescription  Provide education, explanation, and written materials on patient's individual exercise prescription      Expected Outcomes  Short Term: Able to explain program exercise prescription;Long Term: Able to explain home exercise prescription to exercise independently  Short Term: Able to explain program exercise prescription;Long Term: Able to explain home exercise prescription to  exercise independently         Exercise Goals Re-Evaluation : Exercise Goals Re-Evaluation    Row Name 05/04/19 9458             Exercise Goal Re-Evaluation   Exercise Goals Review  Increase Physical Activity;Increase Strength and Stamina;Understanding of Exercise Prescription       Comments  Holly is doing well at home.  She has been walking some, but not consistent enough.  She is eager to come into rehab and has scheduled her orientation. She has also been doing some hand weights at home as well.  She has been aiming for 3-4 days a week for about 10-15 minutes each.       Expected Outcomes  Short: Complete 6MWT.  Long: Attend rehab consistently  Discharge Exercise Prescription (Final Exercise Prescription Changes): Exercise Prescription Changes - 05/18/19 1200      Response to Exercise   Blood Pressure (Admit)  136/70    Blood Pressure (Exercise)  144/76    Blood Pressure (Exit)  136/70    Heart Rate (Admit)  73 bpm    Heart Rate (Exercise)  107 bpm    Heart Rate (Exit)  66 bpm    Oxygen Saturation (Admit)  99 %    Oxygen Saturation (Exercise)  99 %    Rating of Perceived Exertion (Exercise)  7    Symptoms  none    Comments  walk test results       Nutrition:  Target Goals: Understanding of nutrition guidelines, daily intake of sodium <1585m, cholesterol <2095m calories 30% from fat and 7% or less from saturated fats, daily to have 5 or more servings of fruits and vegetables.  Biometrics: Pre Biometrics - 05/18/19 1219      Pre Biometrics   Height  _0  (1.676 m)    Weight  184 lb (83.5 kg)    BMI (Calculated)  29.71    Single Leg Stand  22.78 seconds        Nutrition Therapy Plan and Nutrition Goals: Nutrition Therapy & Goals - 04/20/19 1502      Nutrition Therapy   Diet  HH, low Na diet    Drug/Food Interactions  Statins/Certain Fruits   crestor   Protein (specify units)  65g    Fiber  25 grams    Whole Grain Foods  3 servings     Saturated Fats  12 max. grams    Fruits and Vegetables  5 servings/day    Sodium  1.5 grams      Personal Nutrition Goals   Nutrition Goal  ST: LT: maintain wt, increase energy further, manage healthy    Comments  Sometimes won't eat most of the morning. chicken salad tomatoes, zucchini. Will also eat yogurt, yoplait.  Walnuts before bed. When she does eat B she has waffles, toast tu[place]acon, boiled egg and grits. 60oz water per day. End of March to April would eat 2 meals/day and now eats 1 big meal. Since heart event cut out snacks, soft drinks (used to have 2-3/day). 179lbs maintaing wt. discussed [employer], HH eating, Na, Fiber, Fat, added sugar and added salt.      Intervention Plan   Intervention  Prescribe, educate and counsel regarding individualized specific dietary modifications aiming towards targeted core components such as weight, hypertension, lipid management, diabetes, heart failure and other comorbidities.;Nutrition handout(s) given to patient.   Nutrition info given in email   Expected Outcomes  Short Term Goal: Understand basic principles of dietary content, such as calories, fat, sodium, cholesterol and nutrients.;Short Term Goal: A plan has been developed with personal nutrition goals set during dietitian appointment.;Long Term Goal: Adherence to prescribed nutrition plan.       Nutrition Assessments: Nutrition Assessments - 05/15/19 1213      MEDFICTS Scores   Pre Score  17       Nutrition Goals Re-Evaluation:   Nutrition Goals Discharge (Final Nutrition Goals Re-Evaluation):   Psychosocial: Target Goals: Acknowledge presence or absence of significant depression and/or stress, maximize coping skills, provide positive support system. Participant is able to verbalize types and ability to use techniques and skills needed for reducing stress and depression.   Initial Review & Psychosocial Screening: Initial Psych Review & Screening - 05/08/19 1108  Initial  Review   Current issues with  Current Stress Concerns    Source of Stress Concerns  Chronic Illness    Comments  Has wires from CABG that are being removed on 7/20.      Family Dynamics   Good Support System?  Yes   husband     Barriers   Psychosocial barriers to participate in program  There are no identifiable barriers or psychosocial needs.;The patient should benefit from training in stress management and relaxation.      Screening Interventions   Interventions  Encouraged to exercise;Provide feedback about the scores to participant;To provide support and resources with identified psychosocial needs    Expected Outcomes  Short Term goal: Utilizing psychosocial counselor, staff and physician to assist with identification of specific Stressors or current issues interfering with healing process. Setting desired goal for each stressor or current issue identified.;Long Term Goal: Stressors or current issues are controlled or eliminated.;Short Term goal: Identification and review with participant of any Quality of Life or Depression concerns found by scoring the questionnaire.;Long Term goal: The participant improves quality of Life and PHQ9 Scores as seen by post scores and/or verbalization of changes       Quality of Life Scores:  Quality of Life - 05/15/19 1212      Quality of Life   Select  Quality of Life      Quality of Life Scores   Health/Function Pre  26.6 %    Socioeconomic Pre  26.43 %    Psych/Spiritual Pre  29.14 %    Family Pre  23.4 %    GLOBAL Pre  26.61 %      Scores of 19 and below usually indicate a poorer quality of life in these areas.  A difference of  2-3 points is a clinically meaningful difference.  A difference of 2-3 points in the total score of the Quality of Life Index has been associated with significant improvement in overall quality of life, self-image, physical symptoms, and general health in studies assessing change in quality of life.  PHQ-9: Recent  Review Flowsheet Data    Depression screen [facility] 2/9 05/18/2019 12/31/2018 10/03/2017 09/16/2017 07/27/2016   Decreased Interest 1 0 0 0 0   Down, Depressed, Hopeless 0 0 0 0 0   PHQ - 2 Score 1 0 0 0 0   Altered sleeping 0 - 3 - -   Tired, decreased energy 1 - 1 - -   Change in appetite 0 - 0 - -   Feeling bad or failure about yourself  0 - 0 - -   Trouble concentrating 0 - 0 - -   Moving slowly or fidgety/restless 0 - 0 - -   Suicidal thoughts 0 - 0 - -   PHQ-9 Score 2 - 4 - -   Difficult doing work/chores Not difficult at all - Not difficult at all - -     Interpretation of Total Score  Total Score Depression Severity:  1-4 = Minimal depression, 5-9 = Mild depression, 10-14 = Moderate depression, 15-19 = Moderately severe depression, 20-27 = Severe depression   Psychosocial Evaluation and Intervention:   Psychosocial Re-Evaluation:   Psychosocial Discharge (Final Psychosocial Re-Evaluation):   Vocational Rehabilitation: Provide vocational rehab assistance to qualifying candidates.   Vocational Rehab Evaluation & Intervention: Vocational Rehab - 05/08/19 1112      Initial Vocational Rehab Evaluation & Intervention   Assessment shows need for Vocational Rehabilitation  No  Education: Education Goals: Education classes will be provided on a variety of topics geared toward better understanding of heart health and risk factor modification. Participant will state understanding/return demonstration of topics presented as noted by education test scores.  Learning Barriers/Preferences:   Education Topics:  AED/CPR: - Group verbal and written instruction with the use of models to demonstrate the basic use of the AED with the basic ABC's of resuscitation.   General Nutrition Guidelines/Fats and Fiber: -Group instruction provided by verbal, written material, models and posters to present the general guidelines for heart healthy nutrition. Gives an explanation and review of  dietary fats and fiber.   Controlling Sodium/Reading Food Labels: -Group verbal and written material supporting the discussion of sodium use in heart healthy nutrition. Review and explanation with models, verbal and written materials for utilization of the food label.   Exercise Physiology & General Exercise Guidelines: - Group verbal and written instruction with models to review the exercise physiology of the cardiovascular system and associated critical values. Provides general exercise guidelines with specific guidelines to those with heart or lung disease.    Aerobic Exercise & Resistance Training: - Gives group verbal and written instruction on the various components of exercise. Focuses on aerobic and resistive training programs and the benefits of this training and how to safely progress through these programs..   Flexibility, Balance, Mind/Body Relaxation: Provides group verbal/written instruction on the benefits of flexibility and balance training, including mind/body exercise modes such as yoga, pilates and tai chi.  Demonstration and skill practice provided.   Stress and Anxiety: - Provides group verbal and written instruction about the health risks of elevated stress and causes of high stress.  Discuss the correlation between heart/lung disease and anxiety and treatment options. Review healthy ways to manage with stress and anxiety.   Depression: - Provides group verbal and written instruction on the correlation between heart/lung disease and depressed mood, treatment options, and the stigmas associated with seeking treatment.   Anatomy & Physiology of the Heart: - Group verbal and written instruction and models provide basic cardiac anatomy and physiology, with the coronary electrical and arterial systems. Review of Valvular disease and Heart Failure   Cardiac Procedures: - Group verbal and written instruction to review commonly prescribed medications for heart disease.  Reviews the medication, class of the drug, and side effects. Includes the steps to properly store meds and maintain the prescription regimen. (beta blockers and nitrates)   Cardiac Medications I: - Group verbal and written instruction to review commonly prescribed medications for heart disease. Reviews the medication, class of the drug, and side effects. Includes the steps to properly store meds and maintain the prescription regimen.   Cardiac Medications II: -Group verbal and written instruction to review commonly prescribed medications for heart disease. Reviews the medication, class of the drug, and side effects. (all other drug classes)    Go Sex-Intimacy & Heart Disease, Get SMART - Goal Setting: - Group verbal and written instruction through game format to discuss heart disease and the return to sexual intimacy. Provides group verbal and written material to discuss and apply goal setting through the application of the S.M.A.R.T. Method.   Other Matters of the Heart: - Provides group verbal, written materials and models to describe Stable Angina and Peripheral Artery. Includes description of the disease process and treatment options available to the cardiac patient.   Exercise & Equipment Safety: - Individual verbal instruction and demonstration of equipment use and safety with use of the  equipment.   Cardiac Rehab from 05/18/2019 in [facility] Cardiac and Pulmonary Rehab  Date  05/18/19  Educator  [facility]  Instruction Review Code  1- Verbalizes Understanding      Infection Prevention: - Provides verbal and written material to individual with discussion of infection control including proper hand washing and proper equipment cleaning during exercise session.   Cardiac Rehab from 05/18/2019 in [facility] Cardiac and Pulmonary Rehab  Date  05/18/19  Educator  [facility]  Instruction Review Code  1- Verbalizes Understanding      Falls Prevention: - Provides verbal and written material to individual with  discussion of falls prevention and safety.   Cardiac Rehab from 05/18/2019 in [facility] Cardiac and Pulmonary Rehab  Date  05/18/19  Educator  [facility]  Instruction Review Code  1- Verbalizes Understanding      Diabetes: - Individual verbal and written instruction to review signs/symptoms of diabetes, desired ranges of glucose level fasting, after meals and with exercise. Acknowledge that pre and post exercise glucose checks will be done for 3 sessions at entry of program.   Know Your Numbers and Risk Factors: -Group verbal and written instruction about important numbers in your health.  Discussion of what are risk factors and how they play a role in the disease process.  Review of Cholesterol, Blood Pressure, Diabetes, and BMI and the role they play in your overall health.   Sleep Hygiene: -Provides group verbal and written instruction about how sleep can affect your health.  Define sleep hygiene, discuss sleep cycles and impact of sleep habits. Review good sleep hygiene tips.    Other: -Provides group and verbal instruction on various topics (see comments)   Knowledge Questionnaire Score: Knowledge Questionnaire Score - 05/15/19 1213      Knowledge Questionnaire Score   Pre Score  17/26   Stress, PAD, Exercise,Nutrition and Tobacco questions missed correxct response       Core Components/Risk Factors/Patient Goals at Admission: Personal Goals and Risk Factors at Admission - 05/18/19 1220      Core Components/Risk Factors/Patient Goals on Admission    Weight Management  Yes;Weight Loss    Intervention  Weight Management: Develop a combined nutrition and exercise program designed to reach desired caloric intake, while maintaining appropriate intake of nutrient and fiber, sodium and fats, and appropriate energy expenditure required for the weight goal.;Weight Management: Provide education and appropriate resources to help participant work on and attain dietary goals.    Admit Weight  184 lb  (83.5 kg)    Goal Weight: Short Term  178 lb (80.7 kg)    Goal Weight: Long Term  170 lb (77.1 kg)    Expected Outcomes  Short Term: Continue to assess and modify interventions until short term weight is achieved;Long Term: Adherence to nutrition and physical activity/exercise program aimed toward attainment of established weight goal;Weight Loss: Understanding of general recommendations for a balanced deficit meal plan, which promotes 1-2 lb weight loss per week and includes a negative energy balance of [phone] kcal/d;Understanding recommendations for meals to include 15-35% energy as protein, 25-35% energy from fat, 35-60% energy from carbohydrates, less than 245m of dietary cholesterol, 20-35 gm of total fiber daily;Understanding of distribution of calorie intake throughout the day with the consumption of 4-5 meals/snacks    Lipids  Yes    Intervention  Provide education and support for participant on nutrition & aerobic/resistive exercise along with prescribed medications to achieve LDL <750m HDL >4029m   Expected Outcomes  Short Term:  Participant states understanding of desired cholesterol values and is compliant with medications prescribed. Participant is following exercise prescription and nutrition guidelines.;Long Term: Cholesterol controlled with medications as prescribed, with individualized exercise RX and with personalized nutrition plan. Value goals: LDL < 24m, HDL > 40 mg.       Core Components/Risk Factors/Patient Goals Review:  Goals and Risk Factor Review    Row Name 05/04/19 0923             Core Components/Risk Factors/Patient Goals Review   Personal Goals Review  Weight Management/Obesity;Hypertension       Review  Overall she is doing well.  Her weight has stayed steady and her blood pressures have been doing well.  She does have a follow up scheduled with the surgeon on Wednesday of this week for increased incisional soreness and right sided tightness in her chest.   She is 4 month post surgery so within the normal window for pain, but she does have a chest xray scheduled for that day to make sure nothing else is going on.  She will keep [place].       Expected Outcomes  Short: Talk to doctor about chest/incisional pain.  Long: Continue to monitor risk factors          Core Components/Risk Factors/Patient Goals at Discharge (Final Review):  Goals and Risk Factor Review - 05/04/19 0923      Core Components/Risk Factors/Patient Goals Review   Personal Goals Review  Weight Management/Obesity;Hypertension    Review  Overall she is doing well.  Her weight has stayed steady and her blood pressures have been doing well.  She does have a follow up scheduled with the surgeon on Wednesday of this week for increased incisional soreness and right sided tightness in her chest.  She is 4 month post surgery so within the normal window for pain, but she does have a chest xray scheduled for that day to make sure nothing else is going on.  She will keep [place].    Expected Outcomes  Short: Talk to doctor about chest/incisional pain.  Long: Continue to monitor risk factors       ITP Comments: ITP Comments    Row Name 04/14/19 1656 04/15/19 1519 05/04/19 0918 05/08/19 1119 05/18/19 1215   ITP Comments  Initial Cardiac Home Based Care visit completed Intake and Consent completed Appts for followup done with EP and RD  Completed initial ExRx created and sent to Dr. MEmily Filbert Medical Director to review and sign.  Completed virtual visit. Scheduled to orient on 7/17 and 7/21.  Orientation via phone completed today   Gym orientation with  EP/RD Eval is scheduled for 7/27 at 11 AM.  MTenishiais having surgery to remove the sternal wires on Mon 7/20.  Completed 6MWT and gym orientation today.  [facility]  Initial ITP created and sent for review to Dr. MEmily Filbert Medical Director.      Comments: Initial ITP

## 2019-05-18 NOTE — Patient Instructions (Signed)
Patient Instructions  Patient Details  Name: Jessica [name] MRN: [id number] Date of Birth: 16-Sep-1951 Referring Provider:  Lorretta Harp, MD  Below are your personal goals for exercise, nutrition, and risk factors. Our goal is to help you stay on track towards obtaining and maintaining these goals. We will be discussing your progress on these goals with you throughout the program.  Initial Exercise Prescription: Initial Exercise Prescription - 05/18/19 1200      Date of Initial Exercise RX and Referring Provider   Date  05/18/19    Referring Provider  Quay Burow MD      Treadmill   MPH  3.3    Grade  2    Minutes  15    METs  4.21      NuStep   Level  3    SPM  80    Minutes  15    METs  3      Recumbant Elliptical   Level  2    RPM  50    Minutes  15    METs  3      Prescription Details   Frequency (times per week)  3    Duration  Progress to 30 minutes of continuous aerobic without signs/symptoms of physical distress      Intensity   THRR 40-80% of Max Heartrate  105-136    Ratings of Perceived Exertion  11-13    Perceived Dyspnea  0-4      Progression   Progression  Continue to progress workloads to maintain intensity without signs/symptoms of physical distress.      Resistance Training   Training Prescription  Yes    Weight  4 lbs    Reps  10-15       Exercise Goals: Frequency: Be able to perform aerobic exercise two to three times per week in program working toward 2-5 days per week of home exercise.  Intensity: Work with a perceived exertion of 11 (fairly light) - 15 (hard) while following your exercise prescription.  We will make changes to your prescription with you as you progress through the program.   Duration: Be able to do 30 to 45 minutes of continuous aerobic exercise in addition to a 5 minute warm-up and a 5 minute cool-down routine.   Nutrition Goals: Your personal nutrition goals will be established when you do your nutrition  analysis with the dietician.  The following are general nutrition guidelines to follow: Cholesterol < 200mg /day Sodium < 1500mg /day Fiber: Women over 50 yrs - 21 grams per day  Personal Goals: Personal Goals and Risk Factors at Admission - 05/18/19 1220      Core Components/Risk Factors/Patient Goals on Admission    Weight Management  Yes;Weight Loss    Intervention  Weight Management: Develop a combined nutrition and exercise program designed to reach desired caloric intake, while maintaining appropriate intake of nutrient and fiber, sodium and fats, and appropriate energy expenditure required for the weight goal.;Weight Management: Provide education and appropriate resources to help participant work on and attain dietary goals.    Admit Weight  184 lb (83.5 kg)    Goal Weight: Short Term  178 lb (80.7 kg)    Goal Weight: Long Term  170 lb (77.1 kg)    Expected Outcomes  Short Term: Continue to assess and modify interventions until short term weight is achieved;Long Term: Adherence to nutrition and physical activity/exercise program aimed toward attainment of established weight goal;Weight Loss: Understanding of  general recommendations for a balanced deficit meal plan, which promotes 1-2 lb weight loss per week and includes a negative energy balance of [phone] kcal/d;Understanding recommendations for meals to include 15-35% energy as protein, 25-35% energy from fat, 35-60% energy from carbohydrates, less than 200mg  of dietary cholesterol, 20-35 gm of total fiber daily;Understanding of distribution of calorie intake throughout the day with the consumption of 4-5 meals/snacks    Lipids  Yes    Intervention  Provide education and support for participant on nutrition & aerobic/resistive exercise along with prescribed medications to achieve LDL 70mg , HDL >40mg .    Expected Outcomes  Short Term: Participant states understanding of desired cholesterol values and is compliant with medications  prescribed. Participant is following exercise prescription and nutrition guidelines.;Long Term: Cholesterol controlled with medications as prescribed, with individualized exercise RX and with personalized nutrition plan. Value goals: LDL < 70mg , HDL > 40 mg.       Tobacco Use Initial Evaluation: Social History   Tobacco Use  Smoking Status Never Smoker  Smokeless Tobacco Never Used    Exercise Goals and Review: Exercise Goals    Row Name 04/15/19 1521 05/18/19 1219           Exercise Goals   Increase Physical Activity  Yes  Yes      Intervention  Provide advice, education, support and counseling about physical activity/exercise needs.;Develop an individualized exercise prescription for aerobic and resistive training based on initial evaluation findings, risk stratification, comorbidities and participant's personal goals.  Provide advice, education, support and counseling about physical activity/exercise needs.;Develop an individualized exercise prescription for aerobic and resistive training based on initial evaluation findings, risk stratification, comorbidities and participant's personal goals.      Expected Outcomes  Short Term: Attend rehab on a regular basis to increase amount of physical activity.;Long Term: Add in home exercise to make exercise part of routine and to increase amount of physical activity.;Long Term: Exercising regularly at least 3-5 days a week.  Short Term: Attend rehab on a regular basis to increase amount of physical activity.;Long Term: Add in home exercise to make exercise part of routine and to increase amount of physical activity.;Long Term: Exercising regularly at least 3-5 days a week.      Increase Strength and Stamina  Yes  Yes      Intervention  Provide advice, education, support and counseling about physical activity/exercise needs.;Develop an individualized exercise prescription for aerobic and resistive training based on initial evaluation findings, risk  stratification, comorbidities and participant's personal goals.  Provide advice, education, support and counseling about physical activity/exercise needs.;Develop an individualized exercise prescription for aerobic and resistive training based on initial evaluation findings, risk stratification, comorbidities and participant's personal goals.      Expected Outcomes  Short Term: Increase workloads from initial exercise prescription for resistance, speed, and METs.;Short Term: Perform resistance training exercises routinely during rehab and add in resistance training at home;Long Term: Improve cardiorespiratory fitness, muscular endurance and strength as measured by increased METs and functional capacity (6MWT)  Short Term: Increase workloads from initial exercise prescription for resistance, speed, and METs.;Short Term: Perform resistance training exercises routinely during rehab and add in resistance training at home;Long Term: Improve cardiorespiratory fitness, muscular endurance and strength as measured by increased METs and functional capacity (6MWT)      Able to understand and use rate of perceived exertion (RPE) scale  Yes  Yes      Intervention  Provide education and explanation on how to use RPE  scale  Provide education and explanation on how to use RPE scale      Expected Outcomes  Short Term: Able to use RPE daily in rehab to express subjective intensity level;Long Term:  Able to use RPE to guide intensity level when exercising independently  Short Term: Able to use RPE daily in rehab to express subjective intensity level;Long Term:  Able to use RPE to guide intensity level when exercising independently      Able to understand and use Dyspnea scale  Yes  Yes      Intervention  Provide education and explanation on how to use Dyspnea scale  Provide education and explanation on how to use Dyspnea scale      Expected Outcomes  Short Term: Able to use Dyspnea scale daily in rehab to express subjective  sense of shortness of breath during exertion;Long Term: Able to use Dyspnea scale to guide intensity level when exercising independently  Short Term: Able to use Dyspnea scale daily in rehab to express subjective sense of shortness of breath during exertion;Long Term: Able to use Dyspnea scale to guide intensity level when exercising independently      Knowledge and understanding of Target Heart Rate Range (THRR)  Yes  Yes      Intervention  Provide education and explanation of THRR including how the numbers were predicted and where they are located for reference  Provide education and explanation of THRR including how the numbers were predicted and where they are located for reference      Expected Outcomes  Short Term: Able to state/look up THRR;Short Term: Able to use daily as guideline for intensity in rehab;Long Term: Able to use THRR to govern intensity when exercising independently  Short Term: Able to state/look up THRR;Short Term: Able to use daily as guideline for intensity in rehab;Long Term: Able to use THRR to govern intensity when exercising independently      Able to check pulse independently  Yes  Yes      Intervention  Provide education and demonstration on how to check pulse in carotid and radial arteries.;Review the importance of being able to check your own pulse for safety during independent exercise  Provide education and demonstration on how to check pulse in carotid and radial arteries.;Review the importance of being able to check your own pulse for safety during independent exercise      Expected Outcomes  Short Term: Able to explain why pulse checking is important during independent exercise;Long Term: Able to check pulse independently and accurately  Short Term: Able to explain why pulse checking is important during independent exercise;Long Term: Able to check pulse independently and accurately      Understanding of Exercise Prescription  Yes  Yes      Intervention  Provide  education, explanation, and written materials on patient's individual exercise prescription  Provide education, explanation, and written materials on patient's individual exercise prescription      Expected Outcomes  Short Term: Able to explain program exercise prescription;Long Term: Able to explain home exercise prescription to exercise independently  Short Term: Able to explain program exercise prescription;Long Term: Able to explain home exercise prescription to exercise independently         Copy of goals given to participant.

## 2019-05-20 NOTE — Telephone Encounter (Signed)
Left a detailed message for the patient to get scheduled for her 3 month follow up with an  Per the recommendations for Dr. Gwenlyn Found. Asked that she give the office a call to get scheduled.

## 2019-05-22 ENCOUNTER — Other Ambulatory Visit: Payer: Self-pay

## 2019-05-22 ENCOUNTER — Encounter: Payer: Medicare Other | Admitting: *Deleted

## 2019-05-22 DIAGNOSIS — Z951 Presence of aortocoronary bypass graft: Secondary | ICD-10-CM

## 2019-05-22 NOTE — Progress Notes (Signed)
Daily Session Note  Patient Details  Name: Jessica [name] MRN: [id number] Date of Birth: 1951/07/25 Referring Provider:     Cardiac Rehab from 05/18/2019 in [facility] Cardiac and Pulmonary Rehab  Referring Provider  Quay Burow MD      Encounter Date: 05/22/2019  Check In: Session Check In - 05/22/19 0749      Check-In   Supervising physician immediately available to respond to emergencies  See telemetry face sheet for immediately available ER MD    Location  [facility]-Cardiac & Pulmonary Rehab    Staff Present  Constance Goltz, RN BSN;Agatha Duplechain Luan Pulling, MA, RCEP, CCRP, CCET;Joseph Pal[place]ra;Heath Lark, RN, BSN, CCRP    Virtual Visit  No    Medication changes reported      No    Fall or balance concerns reported     No    Warm-up and Cool-down  Performed on first and last piece of equipment    Resistance Training Performed  Yes    VAD Patient?  No    PAD/SET Patient?  No      Pain Assessment   Currently in Pain?  No/denies          Social History   Tobacco Use  Smoking Status Never Smoker  Smokeless Tobacco Never Used    Goals Met:  Exercise tolerated well Personal goals reviewed No report of cardiac concerns or symptoms Strength training completed today  Goals Unmet:  Not Applicable  Comments: First full day of exercise!  Patient was oriented to gym and equipment including functions, settings, policies, and procedures.  Patient's individual exercise prescription and treatment plan were reviewed.  All starting workloads were established based on the results of the 6 minute walk test done at initial orientation visit.  The plan for exercise progression was also introduced and progression will be customized based on patient's performance and goals.    Dr. Emily Filbert is Medical Director for [place] and [facility].

## 2019-05-25 ENCOUNTER — Other Ambulatory Visit: Payer: Self-pay

## 2019-05-25 ENCOUNTER — Encounter: Payer: Medicare Other | Attending: Cardiovascular Disease | Admitting: *Deleted

## 2019-05-25 DIAGNOSIS — Z951 Presence of aortocoronary bypass graft: Secondary | ICD-10-CM | POA: Diagnosis present

## 2019-05-25 NOTE — Telephone Encounter (Signed)
Patient is scheduled for a virtual phone visit with Almyra Deforest, PA-Con 07-08-19 at 10:00AM for her 3 month folllow-up.

## 2019-05-25 NOTE — Progress Notes (Signed)
Daily Session Note  Patient Details  Name: Jessica [name] MRN: [id number] Date of Birth: 06/04/1951 Referring Provider:     Cardiac Rehab from 05/18/2019 in [facility] Cardiac and Pulmonary Rehab  Referring Provider  Quay Burow MD      Encounter Date: 05/25/2019  Check In: Session Check In - 05/25/19 0756      Check-In   Supervising physician immediately available to respond to emergencies  See telemetry face sheet for immediately available ER MD    Location  [facility]-Cardiac & Pulmonary Rehab    Staff Present  Heath Lark, RN, BSN, CCRP;Amanda Sommer, BA, ACSM CEP, Exercise Physiologist;Jadis Pitter [place], MA, RCEP, CCRP, CCET    Virtual Visit  No    Medication changes reported      No    Fall or balance concerns reported     No    Warm-up and Cool-down  Performed on first and last piece of equipment    Resistance Training Performed  Yes    VAD Patient?  No    PAD/SET Patient?  No      Pain Assessment   Currently in Pain?  No/denies          Social History   Tobacco Use  Smoking Status Never Smoker  Smokeless Tobacco Never Used    Goals Met:  Independence with exercise equipment Exercise tolerated well No report of cardiac concerns or symptoms Strength training completed today  Goals Unmet:  Not Applicable  Comments: Pt able to follow exercise prescription today without complaint.  Will continue to monitor for progression.    Dr. Emily Filbert is Medical Director for [place] and [facility].

## 2019-05-27 ENCOUNTER — Encounter: Payer: Self-pay | Admitting: Surgery

## 2019-05-27 ENCOUNTER — Other Ambulatory Visit: Payer: Self-pay

## 2019-05-27 ENCOUNTER — Ambulatory Visit: Payer: Medicare Other | Admitting: Surgery

## 2019-05-27 ENCOUNTER — Ambulatory Visit (INDEPENDENT_AMBULATORY_CARE_PROVIDER_SITE_OTHER): Payer: Self-pay | Admitting: Surgery

## 2019-05-27 VITALS — BP 134/84 | HR 58 | Temp 97.3°F | Resp 16 | Ht 66.0 in | Wt 181.0 lb

## 2019-05-27 DIAGNOSIS — Z951 Presence of aortocoronary bypass graft: Secondary | ICD-10-CM

## 2019-05-27 NOTE — Progress Notes (Signed)
Daily Session Note  Patient Details  Name: Jessica [name] MRN: [id number] Date of Birth: 05/31/1951 Referring Provider:     Cardiac Rehab from 05/18/2019 in [facility] Cardiac and Pulmonary Rehab  Referring Provider  Quay Burow MD      Encounter Date: 05/27/2019  Check In:      Social History   Tobacco Use  Smoking Status Never Smoker  Smokeless Tobacco Never Used    Goals Met:  Independence with exercise equipment Exercise tolerated well No report of cardiac concerns or symptoms Strength training completed today  Goals Unmet:  Not Applicable  Comments: Pt able to follow exercise prescription today without complaint.  Will continue to monitor for progression.    Dr. Emily Filbert is Medical Director for [place] and [facility].

## 2019-05-27 NOTE — Progress Notes (Signed)
     HPI: Jessica [name] returns to the office today for follow-up status post removal of her sternal wires due to persistent chest wall discomfort 4 months after coronary bypass surgery.  She says that she has been feeling well and all of the discomfort she had before his resolved.  She is not taking any pain medicine and planning to return to cardiac rehab.  She is also interested in resuming her Crestor which she had discontinued, thinking that it may be causing her pain.  Current Outpatient Medications  Medication Sig Dispense Refill  . Ascorbic Acid (VITAMIN C) 1000 MG tablet Take 2,000 mg by mouth daily.    [name] aspirin EC 325 MG EC tablet Take 1 tablet (325 mg total) by mouth daily. (Patient taking differently: Take 325 mg by mouth at bedtime. )  0  . Cholecalciferol (VITAMIN D-3) 125 MCG (5000 UT) TABS Take 5,000 Units by mouth daily.    . Digestive Enzymes (DIGESTIVE ENZYME PO) Take 1 capsule by mouth 2 (two) times daily.     [name] loratadine (CLARITIN) 10 MG tablet Take 10 mg by mouth every evening.     . metoprolol tartrate (LOPRESSOR) 25 MG tablet Take 0.5 tablets (12.5 mg total) by mouth 2 (two) times daily. 30 tablet 11  . Multiple Vitamin (MULTIVITAMIN WITH MINERALS) TABS tablet Take 1 tablet by mouth daily.    . rosuvastatin (CRESTOR) 20 MG tablet Start with 1 tablet three times weekly and increase to 1 tablet daily as tolerated (Patient not taking: Reported on 05/27/2019) 30 tablet 5   No current facility-administered medications for this visit.      Physical Exam: BP 134/84 (BP Location: Right Arm, Patient Position: Sitting, Cuff Size: Normal)   Pulse (!) 58   Temp (!) 97.3 F (36.3 C) Comment (Src): thermal  Resp 16   Ht 5\' 6"  (1.676 m)   Wt 181 lb (82.1 kg)   SpO2 96% Comment: ON RA  BMI 29.21 kg/m  She looks well and is in much better spirits. The chest incisions are healing well. There is no incisional or chest wall discomfort.  Diagnostic Tests: None today   Impression:  She has had complete resolution of her chest wall pain with removal of the sternal wires.  I would not expect her to have a more problems with this.  I told her that she can return to normal activity.  She will resume her Crestor as before.  Plan:  She will continue to follow-up with Dr. Gwenlyn Found for her cardiology care.    Gaye Pollack, MD [facility] [phone]

## 2019-05-29 ENCOUNTER — Other Ambulatory Visit: Payer: Self-pay

## 2019-05-29 DIAGNOSIS — Z951 Presence of aortocoronary bypass graft: Secondary | ICD-10-CM

## 2019-05-29 NOTE — Progress Notes (Signed)
Daily Session Note  Patient Details  Name: Jessica [name] MRN: [id number] Date of Birth: 17-Nov-1950 Referring Provider:     Cardiac Rehab from 05/18/2019 in [facility] Cardiac and Pulmonary Rehab  Referring Provider  Quay Burow MD      Encounter Date: 05/29/2019  Check In:      Social History   Tobacco Use  Smoking Status Never Smoker  Smokeless Tobacco Never Used    Goals Met:  Independence with exercise equipment Exercise tolerated well No report of cardiac concerns or symptoms Strength training completed today  Goals Unmet:  Not Applicable  Comments: Pt able to follow exercise prescription today without complaint.  Will continue to monitor for progression.    Dr. Emily Filbert is Medical Director for [place] and [facility].

## 2019-06-01 ENCOUNTER — Other Ambulatory Visit: Payer: Self-pay

## 2019-06-01 ENCOUNTER — Encounter: Payer: Medicare Other | Admitting: *Deleted

## 2019-06-01 DIAGNOSIS — Z951 Presence of aortocoronary bypass graft: Secondary | ICD-10-CM

## 2019-06-01 NOTE — Progress Notes (Signed)
Daily Session Note  Patient Details  Name: Jessica [name] MRN: [id number] Date of Birth: 29-Apr-1951 Referring Provider:     Cardiac Rehab from 05/18/2019 in [facility]  Referring Provider  Quay Burow MD      Encounter Date: 06/01/2019  Check In: Session Check In - 06/01/19 0753      Check-In   Supervising physician immediately available to respond to emergencies  See telemetry face sheet for immediately available ER MD    Location  [facility]    Staff Present  Heath Lark, RN, BSN, Laveda Norman, BS, ACSM CEP, Exercise Physiologist;Demareon Coldwell Lonsdale, MA, RCEP, CCRP, CCET;Joseph [place] [employer]    Virtual Visit  No    Medication changes reported      No    Fall or balance concerns reported     No    Warm-up and Cool-down  Performed on first and last piece of equipment    Resistance Training Performed  Yes    VAD Patient?  No    PAD/SET Patient?  No      Pain Assessment   Currently in Pain?  No/denies          Social History   Tobacco Use  Smoking Status Never Smoker  Smokeless Tobacco Never Used    Goals Met:  Independence with exercise equipment Exercise tolerated well Personal goals reviewed No report of cardiac concerns or symptoms Strength training completed today  Goals Unmet:  Not Applicable  Comments: Pt able to follow exercise prescription today without complaint.  Will continue to monitor for progression.  Updated home exercise with pt today.  Pt plans to continue to walk at home for exercise.  Reviewed THR, pulse, RPE, sign and symptoms, NTG use, and when to call 911 or MD.  Also discussed weather considerations and indoor options.  Pt voiced understanding.    Dr. Emily Filbert is Medical Director for [facility] and [facility].

## 2019-06-03 ENCOUNTER — Encounter: Payer: Self-pay | Admitting: *Deleted

## 2019-06-03 DIAGNOSIS — Z951 Presence of aortocoronary bypass graft: Secondary | ICD-10-CM

## 2019-06-03 NOTE — Progress Notes (Signed)
Cardiac Individual Treatment Plan  Patient Details  Name: Jessica [name] MRN: [id number] Date of Birth: [birth year] Referring Provider:     Cardiac Rehab from 05/18/2019 in [facility] Cardiac and Pulmonary Rehab  Referring Provider  Quay Burow MD      Initial Encounter Date:    Cardiac Rehab from 05/18/2019 in [facility] Cardiac and Pulmonary Rehab  Date  05/18/19      Visit Diagnosis: S/P CABG x 5  Patient's Home Medications on Admission:  Current Outpatient Medications:  .  Ascorbic Acid (VITAMIN C) 1000 MG tablet, Take 2,000 mg by mouth daily., Disp: , Rfl:  .  aspirin EC 325 MG EC tablet, Take 1 tablet (325 mg total) by mouth daily. (Patient taking differently: Take 325 mg by mouth at bedtime. ), Disp: , Rfl: 0 .  Cholecalciferol (VITAMIN D-3) 125 MCG (5000 UT) TABS, Take 5,000 Units by mouth daily., Disp: , Rfl:  .  Digestive Enzymes (DIGESTIVE ENZYME PO), Take 1 capsule by mouth 2 (two) times daily. , Disp: , Rfl:  .  loratadine (CLARITIN) 10 MG tablet, Take 10 mg by mouth every evening. , Disp: , Rfl:  .  metoprolol tartrate (LOPRESSOR) 25 MG tablet, Take 0.5 tablets (12.5 mg total) by mouth 2 (two) times daily., Disp: 30 tablet, Rfl: 11 .  Multiple Vitamin (MULTIVITAMIN WITH MINERALS) TABS tablet, Take 1 tablet by mouth daily., Disp: , Rfl:  .  rosuvastatin (CRESTOR) 20 MG tablet, Start with 1 tablet three times weekly and increase to 1 tablet daily as tolerated (Patient not taking: Reported on 05/27/2019), Disp: 30 tablet, Rfl: 5  Past Medical History: Past Medical History:  Diagnosis Date  . Allergy   . Breast cancer ([place]) 2000  . Cancer [facility])    breast  . Colon polyp   . Coronary artery disease   . GERD (gastroesophageal reflux disease)   . History of blood transfusion   . Hyperlipidemia   . Migraine    history of migrarines, none as an adult  . Personal history of radiation therapy 2000    Tobacco Use: Social History   Tobacco Use  Smoking Status Never Smoker   Smokeless Tobacco Never Used    Labs: Recent Review Flowsheet Data    Labs for ITP Cardiac and Pulmonary Rehab Latest Ref Rng & Units 01/05/2019 01/05/2019 01/05/2019 01/05/2019 03/12/2019   Cholestrol 100 - 199 mg/dL - - - - 113   LDLCALC 0 - 99 mg/dL - - - - 63   HDL >39 mg/dL - - - - 33(L)   Trlycerides 0 - 149 mg/dL - - - - 83   Hemoglobin A1c 4.8 - 5.6 % - - - - -   PHART 7.350 - 7.450 7.468(H) 7.380 7.315(L) 7.265(L) -   PCO2ART 32.0 - 48.0 mmHg 32.1 38.0 47.1 49.9(H) -   HCO3 20.0 - 28.0 mmol/L 23.3 22.9 23.9 22.6 -   TCO2 22 - 32 mmol/L 24 24 25 24  -   ACIDBASEDEF 0.0 - 2.0 mmol/L - 3.0(H) 2.0 4.0(H) -   O2SAT % 100.0 99.0 97.0 98.0 -       Exercise Target Goals: Exercise Program Goal: Individual exercise prescription set using results from initial 6 min walk test and THRR while considering  patient's activity barriers and safety.   Exercise Prescription Goal: Initial exercise prescription builds to 30-45 minutes a day of aerobic activity, 2-3 days per week.  Home exercise guidelines will be given to patient during program as part of  exercise prescription that the participant will acknowledge.  Activity Barriers & Risk Stratification: Activity Barriers & Cardiac Risk Stratification - 05/18/19 1217      Activity Barriers & Cardiac Risk Stratification   Activity Barriers  Deconditioning;Muscular Weakness;Incisional Pain   sternal wire removed on 05/11/19   Cardiac Risk Stratification  High       6 Minute Walk: 6 Minute Walk    Row Name 05/18/19 1216         6 Minute Walk   Phase  Initial     Distance  1770 feet     Walk Time  6 minutes     # of Rest Breaks  0     MPH  3.35     METS  3.86     RPE  7     VO2 Peak  13.49     Symptoms  No     Resting HR  73 bpm     Resting BP  136/70     Resting Oxygen Saturation   99 %     Exercise Oxygen Saturation  during 6 min walk  99 %     Max Ex. HR  107 bpm     Max Ex. BP  144/76     2 Minute Post BP  136/70         Oxygen Initial Assessment:   Oxygen Re-Evaluation:   Oxygen Discharge (Final Oxygen Re-Evaluation):   Initial Exercise Prescription: Initial Exercise Prescription - 05/18/19 1200      Date of Initial Exercise RX and Referring Provider   Date  05/18/19    Referring Provider  Quay Burow MD      Treadmill   MPH  3.3    Grade  2    Minutes  15    METs  4.21      NuStep   Level  3    SPM  80    Minutes  15    METs  3      Recumbant Elliptical   Level  2    RPM  50    Minutes  15    METs  3      Prescription Details   Frequency (times per week)  3    Duration  Progress to 30 minutes of continuous aerobic without signs/symptoms of physical distress      Intensity   THRR 40-80% of Max Heartrate  105-136    Ratings of Perceived Exertion  11-13    Perceived Dyspnea  0-4      Progression   Progression  Continue to progress workloads to maintain intensity without signs/symptoms of physical distress.      Resistance Training   Training Prescription  Yes    Weight  4 lbs    Reps  10-15       Perform Capillary Blood Glucose checks as needed.  Exercise Prescription Changes: Exercise Prescription Changes    Row Name 05/18/19 1200 05/25/19 1000 06/01/19 0800         Response to Exercise   Blood Pressure (Admit)  136/70  128/74  -     Blood Pressure (Exercise)  144/76  126/64  -     Blood Pressure (Exit)  136/70  124/72  -     Heart Rate (Admit)  73 bpm  57 bpm  -     Heart Rate (Exercise)  107 bpm  110 bpm  -     Heart Rate (Exit)  66 bpm  84 bpm  -     Oxygen Saturation (Admit)  99 %  -  -     Oxygen Saturation (Exercise)  99 %  -  -     Rating of Perceived Exertion (Exercise)  7  12  -     Symptoms  none  none   -     Comments  walk test results  second full day of exercise  -     Duration  -  Continue with 30 min of aerobic exercise without signs/symptoms of physical distress.  -     Intensity  -  THRR unchanged  -       Progression    Progression  -  Continue to progress workloads to maintain intensity without signs/symptoms of physical distress.  -     Average METs  -  3.52  -       Resistance Training   Training Prescription  -  Yes  -     Weight  -  4 lbs  -     Reps  -  10-15  -       Interval Training   Interval Training  -  No  -       Treadmill   MPH  -  3.3  -     Grade  -  2  -     Minutes  -  15  -     METs  -  4.44  -       NuStep   Level  -  3  -     Minutes  -  15  -     METs  -  2.6  -       Home Exercise Plan   Plans to continue exercise at  -  -  Home (comment) walking, weights     Frequency  -  -  Add 1 additional day to program exercise sessions.     Initial Home Exercises Provided  -  -  06/01/19        Exercise Comments: Exercise Comments    Row Name 05/22/19 0750           Exercise Comments  First full day of exercise!  Patient was oriented to gym and equipment including functions, settings, policies, and procedures.  Patient's individual exercise prescription and treatment plan were reviewed.  All starting workloads were established based on the results of the 6 minute walk test done at initial orientation visit.  The plan for exercise progression was also introduced and progression will be customized based on patient's performance and goals.          Exercise Goals and Review: Exercise Goals    Row Name 04/15/19 1521 05/18/19 1219           Exercise Goals   Increase Physical Activity  Yes  Yes      Intervention  Provide advice, education, support and counseling about physical activity/exercise needs.;Develop an individualized exercise prescription for aerobic and resistive training based on initial evaluation findings, risk stratification, comorbidities and participant's personal goals.  Provide advice, education, support and counseling about physical activity/exercise needs.;Develop an individualized exercise prescription for aerobic and resistive training based on initial  evaluation findings, risk stratification, comorbidities and participant's personal goals.      Expected Outcomes  Short Term: Attend rehab on a regular basis to increase amount of physical activity.;Long Term: Add in home exercise  to make exercise part of routine and to increase amount of physical activity.;Long Term: Exercising regularly at least 3-5 days a week.  Short Term: Attend rehab on a regular basis to increase amount of physical activity.;Long Term: Add in home exercise to make exercise part of routine and to increase amount of physical activity.;Long Term: Exercising regularly at least 3-5 days a week.      Increase Strength and Stamina  Yes  Yes      Intervention  Provide advice, education, support and counseling about physical activity/exercise needs.;Develop an individualized exercise prescription for aerobic and resistive training based on initial evaluation findings, risk stratification, comorbidities and participant's personal goals.  Provide advice, education, support and counseling about physical activity/exercise needs.;Develop an individualized exercise prescription for aerobic and resistive training based on initial evaluation findings, risk stratification, comorbidities and participant's personal goals.      Expected Outcomes  Short Term: Increase workloads from initial exercise prescription for resistance, speed, and METs.;Short Term: Perform resistance training exercises routinely during rehab and add in resistance training at home;Long Term: Improve cardiorespiratory fitness, muscular endurance and strength as measured by increased METs and functional capacity (6MWT)  Short Term: Increase workloads from initial exercise prescription for resistance, speed, and METs.;Short Term: Perform resistance training exercises routinely during rehab and add in resistance training at home;Long Term: Improve cardiorespiratory fitness, muscular endurance and strength as measured by increased METs and  functional capacity (6MWT)      Able to understand and use rate of perceived exertion (RPE) scale  Yes  Yes      Intervention  Provide education and explanation on how to use RPE scale  Provide education and explanation on how to use RPE scale      Expected Outcomes  Short Term: Able to use RPE daily in rehab to express subjective intensity level;Long Term:  Able to use RPE to guide intensity level when exercising independently  Short Term: Able to use RPE daily in rehab to express subjective intensity level;Long Term:  Able to use RPE to guide intensity level when exercising independently      Able to understand and use Dyspnea scale  Yes  Yes      Intervention  Provide education and explanation on how to use Dyspnea scale  Provide education and explanation on how to use Dyspnea scale      Expected Outcomes  Short Term: Able to use Dyspnea scale daily in rehab to express subjective sense of shortness of breath during exertion;Long Term: Able to use Dyspnea scale to guide intensity level when exercising independently  Short Term: Able to use Dyspnea scale daily in rehab to express subjective sense of shortness of breath during exertion;Long Term: Able to use Dyspnea scale to guide intensity level when exercising independently      Knowledge and understanding of Target Heart Rate Range (THRR)  Yes  Yes      Intervention  Provide education and explanation of THRR including how the numbers were predicted and where they are located for reference  Provide education and explanation of THRR including how the numbers were predicted and where they are located for reference      Expected Outcomes  Short Term: Able to state/look up THRR;Short Term: Able to use daily as guideline for intensity in rehab;Long Term: Able to use THRR to govern intensity when exercising independently  Short Term: Able to state/look up THRR;Short Term: Able to use daily as guideline for intensity in rehab;Long Term: Able to  use THRR to  govern intensity when exercising independently      Able to check pulse independently  Yes  Yes      Intervention  Provide education and demonstration on how to check pulse in carotid and radial arteries.;Review the importance of being able to check your own pulse for safety during independent exercise  Provide education and demonstration on how to check pulse in carotid and radial arteries.;Review the importance of being able to check your own pulse for safety during independent exercise      Expected Outcomes  Short Term: Able to explain why pulse checking is important during independent exercise;Long Term: Able to check pulse independently and accurately  Short Term: Able to explain why pulse checking is important during independent exercise;Long Term: Able to check pulse independently and accurately      Understanding of Exercise Prescription  Yes  Yes      Intervention  Provide education, explanation, and written materials on patient's individual exercise prescription  Provide education, explanation, and written materials on patient's individual exercise prescription      Expected Outcomes  Short Term: Able to explain program exercise prescription;Long Term: Able to explain home exercise prescription to exercise independently  Short Term: Able to explain program exercise prescription;Long Term: Able to explain home exercise prescription to exercise independently         Exercise Goals Re-Evaluation : Exercise Goals Re-Evaluation    Row Name 05/04/19 6468 05/22/19 0751 05/25/19 1028 06/01/19 0807       Exercise Goal Re-Evaluation   Exercise Goals Review  Increase Physical Activity;Increase Strength and Stamina;Understanding of Exercise Prescription  Understanding of Exercise Prescription;Increase Physical Activity;Increase Strength and Stamina;Able to understand and use rate of perceived exertion (RPE) scale;Knowledge and understanding of Target Heart Rate Range (THRR)  Increase Physical  Activity;Increase Strength and Stamina;Understanding of Exercise Prescription  Increase Physical Activity;Increase Strength and Stamina;Understanding of Exercise Prescription    Comments  Yina is doing well at home.  She has been walking some, but not consistent enough.  She is eager to come into rehab and has scheduled her orientation. She has also been doing some hand weights at home as well.  She has been aiming for 3-4 days a week for about 10-15 minutes each.  Reviewed RPE scale, THR and program prescription with pt today.  Pt voiced understanding and was given a copy of goals to take home.  Dorrene is off to a good start in rehab. She has completed her first two full days of exercise.  We will continue to monitor her progression.  Anyla is still doing her one extra day a week at home.  She is also doing her weights at home.  Updated home exercise with pt today.  Pt plans to continue to walk at home for exercise.  Reviewed THR, pulse, RPE, sign and symptoms, NTG use, and when to call 911 or MD.  Also discussed weather considerations and indoor options.  Pt voiced understanding.    Expected Outcomes  Short: Complete 6MWT.  Long: Attend rehab consistently  Short: Use RPE daily to regulate intensity. Long: Follow program prescription in THR.  Short: Continue to attend rehab regularly  Long: Continue to follow program prescription.  Short: Continue to attend rehab. Long: Continue to exercise independently.       Discharge Exercise Prescription (Final Exercise Prescription Changes): Exercise Prescription Changes - 06/01/19 0800      Home Exercise Plan   Plans to continue exercise at  Home (comment)   walking, weights   Frequency  Add 1 additional day to program exercise sessions.    Initial Home Exercises Provided  06/01/19       Nutrition:  Target Goals: Understanding of nutrition guidelines, daily intake of sodium <1533m, cholesterol <2064m calories 30% from fat and 7% or less from  saturated fats, daily to have 5 or more servings of fruits and vegetables.  Biometrics: Pre Biometrics - 05/18/19 1219      Pre Biometrics   Height  5' 6"  (1.676 m)    Weight  184 lb (83.5 kg)    BMI (Calculated)  29.71    Single Leg Stand  22.78 seconds        Nutrition Therapy Plan and Nutrition Goals: Nutrition Therapy & Goals - 04/20/19 1502      Nutrition Therapy   Diet  HH, low Na diet    Drug/Food Interactions  Statins/Certain Fruits   crestor   Protein (specify units)  65g    Fiber  25 grams    Whole Grain Foods  3 servings    Saturated Fats  12 max. grams    Fruits and Vegetables  5 servings/day    Sodium  1.5 grams      Personal Nutrition Goals   Nutrition Goal  ST: LT: maintain wt, increase energy further, manage healthy    Comments  Sometimes won't eat most of the morning. chicken salad tomatoes, zucchini. Will also eat yogurt, yoplait.  Walnuts before bed. When she does eat B she has waffles, toast tu[place]acon, boiled egg and grits. 60oz water per day. End of March to April would eat 2 meals/day and now eats 1 big meal. Since heart event cut out snacks, soft drinks (used to have 2-3/day). 179lbs maintaing wt. discussed [employer], HH eating, Na, Fiber, Fat, added sugar and added salt.      Intervention Plan   Intervention  Prescribe, educate and counsel regarding individualized specific dietary modifications aiming towards targeted core components such as weight, hypertension, lipid management, diabetes, heart failure and other comorbidities.;Nutrition handout(s) given to patient.   Nutrition info given in email   Expected Outcomes  Short Term Goal: Understand basic principles of dietary content, such as calories, fat, sodium, cholesterol and nutrients.;Short Term Goal: A plan has been developed with personal nutrition goals set during dietitian appointment.;Long Term Goal: Adherence to prescribed nutrition plan.       Nutrition Assessments: Nutrition Assessments -  05/15/19 1213      MEDFICTS Scores   Pre Score  17       Nutrition Goals Re-Evaluation: Nutrition Goals Re-Evaluation    Row Name 05/29/19 0822             Goals   Current Weight  184 lb 11.2 oz (83.8 kg)       Nutrition Goal  ST: continue to limit added salt and sugar LT: maintain wt, increase energy further, manage healthy       Comment  Pt reports not wanting to make specific goals, but she is currently working on limiting her Na and added sugar intake; she used to drink 2-3 sodas per day and is now at 1/week. Pt not counting her daily sodium, but is choosing low sodium options. Pt is working on label reading. Encouraged pt that she is making progress and doing well. Told pt that if she needed any resources or help to let me know.       Expected Outcome  ST: continue to limit added salt and sugar LT: maintain wt, increase energy further, manage healthy          Nutrition Goals Discharge (Final Nutrition Goals Re-Evaluation): Nutrition Goals Re-Evaluation - 05/29/19 1610      Goals   Current Weight  184 lb 11.2 oz (83.8 kg)    Nutrition Goal  ST: continue to limit added salt and sugar LT: maintain wt, increase energy further, manage healthy    Comment  Pt reports not wanting to make specific goals, but she is currently working on limiting her Na and added sugar intake; she used to drink 2-3 sodas per day and is now at 1/week. Pt not counting her daily sodium, but is choosing low sodium options. Pt is working on label reading. Encouraged pt that she is making progress and doing well. Told pt that if she needed any resources or help to let me know.    Expected Outcome  ST: continue to limit added salt and sugar LT: maintain wt, increase energy further, manage healthy       Psychosocial: Target Goals: Acknowledge presence or absence of significant depression and/or stress, maximize coping skills, provide positive support system. Participant is able to verbalize types and ability to  use techniques and skills needed for reducing stress and depression.   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      Family Dynamics   Good Support System?  Yes   husband     Barriers   Psychosocial barriers to participate in program  There are no identifiable barriers or psychosocial needs.;The patient should benefit from training in stress management and relaxation.      Screening Interventions   Interventions  Encouraged to exercise;Provide feedback about the scores to participant;To provide support and resources with identified psychosocial needs    Expected Outcomes  Short Term goal: Utilizing psychosocial counselor, staff and physician to assist with identification of specific Stressors or current issues interfering with healing process. Setting desired goal for each stressor or current issue identified.;Long Term Goal: Stressors or current issues are controlled or eliminated.;Short Term goal: Identification and review with participant of any Quality of Life or Depression concerns found by scoring the questionnaire.;Long Term goal: The participant improves quality of Life and PHQ9 Scores as seen by post scores and/or verbalization of changes       Quality of Life Scores:  Quality of Life - 05/15/19 1212      Quality of Life   Select  Quality of Life      Quality of Life Scores   Health/Function Pre  26.6 %    Socioeconomic Pre  26.43 %    Psych/Spiritual Pre  29.14 %    Family Pre  23.4 %    GLOBAL Pre  26.61 %      Scores of 19 and below usually indicate a poorer quality of life in these areas.  A difference of  2-3 points is a clinically meaningful difference.  A difference of 2-3 points in the total score of the Quality of Life Index has been  associated with significant improvement in overall quality of life, self-image, physical symptoms, and general health in studies assessing change in quality of  life.  PHQ-9: Recent Review Flowsheet Data    Depression screen [facility] 2/9 05/18/2019 12/31/2018 10/03/2017 09/16/2017 07/27/2016   Decreased Interest 1 0 0 0 0   Down, Depressed, Hopeless 0 0 0 0 0   PHQ - 2 Score 1 0 0 0 0   Altered sleeping 0 - 3 - -   Tired, decreased energy 1 - 1 - -   Change in appetite 0 - 0 - -   Feeling bad or failure about yourself  0 - 0 - -   Trouble concentrating 0 - 0 - -   Moving slowly or fidgety/restless 0 - 0 - -   Suicidal thoughts 0 - 0 - -   PHQ-9 Score 2 - 4 - -   Difficult doing work/chores Not difficult at all - Not difficult at all - -     Interpretation of Total Score  Total Score Depression Severity:  1-4 = Minimal depression, 5-9 = Mild depression, 10-14 = Moderate depression, 15-19 = Moderately severe depression, 20-27 = Severe depression   Psychosocial Evaluation and Intervention:   Psychosocial Re-Evaluation:   Psychosocial Discharge (Final Psychosocial Re-Evaluation):   Vocational Rehabilitation: Provide vocational rehab assistance to qualifying candidates.   Vocational Rehab Evaluation & Intervention: Vocational Rehab - 05/08/19 1112      Initial Vocational Rehab Evaluation & Intervention   Assessment shows need for Vocational Rehabilitation  No       Education: Education Goals: Education classes will be provided on a variety of topics geared toward better understanding of heart health and risk factor modification. Participant will state understanding/return demonstration of topics presented as noted by education test scores.  Learning Barriers/Preferences:   Education Topics:  AED/CPR: - Group verbal and written instruction with the use of models to demonstrate the basic use of the AED with the basic ABC's of resuscitation.   General Nutrition Guidelines/Fats  and Fiber: -Group instruction provided by verbal, written material, models and posters to present the general guidelines for heart healthy nutrition. Gives an explanation and review of dietary fats and fiber.   Controlling Sodium/Reading Food Labels: -Group verbal and written material supporting the discussion of sodium use in heart healthy nutrition. Review and explanation with models, verbal and written materials for utilization of the food label.   Exercise Physiology & General Exercise Guidelines: - Group verbal and written instruction with models to review the exercise physiology of the cardiovascular system and associated critical values. Provides general exercise guidelines with specific guidelines to those with heart or lung disease.    Aerobic Exercise & Resistance Training: - Gives group verbal and written instruction on the various components of exercise. Focuses on aerobic and resistive training programs and the benefits of this training and how to safely progress through these programs..   Flexibility, Balance, Mind/Body Relaxation: Provides group verbal/written instruction on the benefits of flexibility and balance training, including mind/body exercise modes such as yoga, pilates and tai chi.  Demonstration and skill practice provided.   Stress and Anxiety: - Provides group verbal and written instruction about the health risks of elevated stress and causes of high stress.  Discuss the correlation between heart/lung disease and anxiety and treatment options. Review healthy ways to manage with stress and anxiety.   Depression: - Provides group verbal and written instruction on the correlation between heart/lung disease and depressed mood, treatment options, and the stigmas associated with seeking treatment.   Anatomy & Physiology of the Heart: - Group verbal and written instruction  and models provide basic cardiac anatomy and physiology, with the coronary electrical and  arterial systems. Review of Valvular disease and Heart Failure   Cardiac Procedures: - Group verbal and written instruction to review commonly prescribed medications for heart disease. Reviews the medication, class of the drug, and side effects. Includes the steps to properly store meds and maintain the prescription regimen. (beta blockers and nitrates)   Cardiac Medications I: - Group verbal and written instruction to review commonly prescribed medications for heart disease. Reviews the medication, class of the drug, and side effects. Includes the steps to properly store meds and maintain the prescription regimen.   Cardiac Medications II: -Group verbal and written instruction to review commonly prescribed medications for heart disease. Reviews the medication, class of the drug, and side effects. (all other drug classes)    Go Sex-Intimacy & Heart Disease, Get SMART - Goal Setting: - Group verbal and written instruction through game format to discuss heart disease and the return to sexual intimacy. Provides group verbal and written material to discuss and apply goal setting through the application of the S.M.A.R.T. Method.   Other Matters of the Heart: - Provides group verbal, written materials and models to describe Stable Angina and Peripheral Artery. Includes description of the disease process and treatment options available to the cardiac patient.   Exercise & Equipment Safety: - Individual verbal instruction and demonstration of equipment use and safety with use of the equipment.   Cardiac Rehab from 05/18/2019 in [facility] Cardiac and Pulmonary Rehab  Date  05/18/19  Educator  [facility]  Instruction Review Code  1- Verbalizes Understanding      Infection Prevention: - Provides verbal and written material to individual with discussion of infection control including proper hand washing and proper equipment cleaning during exercise session.   Cardiac Rehab from 05/18/2019 in [facility] Cardiac and  Pulmonary Rehab  Date  05/18/19  Educator  [facility]  Instruction Review Code  1- Verbalizes Understanding      Falls Prevention: - Provides verbal and written material to individual with discussion of falls prevention and safety.   Cardiac Rehab from 05/18/2019 in [facility] Cardiac and Pulmonary Rehab  Date  05/18/19  Educator  [facility]  Instruction Review Code  1- Verbalizes Understanding      Diabetes: - Individual verbal and written instruction to review signs/symptoms of diabetes, desired ranges of glucose level fasting, after meals and with exercise. Acknowledge that pre and post exercise glucose checks will be done for 3 sessions at entry of program.   Know Your Numbers and Risk Factors: -Group verbal and written instruction about important numbers in your health.  Discussion of what are risk factors and how they play a role in the disease process.  Review of Cholesterol, Blood Pressure, Diabetes, and BMI and the role they play in your overall health.   Sleep Hygiene: -Provides group verbal and written instruction about how sleep can affect your health.  Define sleep hygiene, discuss sleep cycles and impact of sleep habits. Review good sleep hygiene tips.    Other: -Provides group and verbal instruction on various topics (see comments)   Knowledge Questionnaire Score: Knowledge Questionnaire Score - 05/15/19 1213      Knowledge Questionnaire Score   Pre Score  17/26   Stress, PAD, Exercise,Nutrition and Tobacco questions missed correxct response       Core Components/Risk Factors/Patient Goals at Admission: Personal Goals and Risk Factors at Admission - 05/18/19 1220      Core Components/Risk  Factors/Patient Goals on Admission    Weight Management  Yes;Weight Loss    Intervention  Weight Management: Develop a combined nutrition and exercise program designed to reach desired caloric intake, while maintaining appropriate intake of nutrient and fiber, sodium and fats, and appropriate  energy expenditure required for the weight goal.;Weight Management: Provide education and appropriate resources to help participant work on and attain dietary goals.    Admit Weight  184 lb (83.5 kg)    Goal Weight: Short Term  178 lb (80.7 kg)    Goal Weight: Long Term  170 lb (77.1 kg)    Expected Outcomes  Short Term: Continue to assess and modify interventions until short term weight is achieved;Long Term: Adherence to nutrition and physical activity/exercise program aimed toward attainment of established weight goal;Weight Loss: Understanding of general recommendations for a balanced deficit meal plan, which promotes 1-2 lb weight loss per week and includes a negative energy balance of [phone] kcal/d;Understanding recommendations for meals to include 15-35% energy as protein, 25-35% energy from fat, 35-60% energy from carbohydrates, less than 247m of dietary cholesterol, 20-35 gm of total fiber daily;Understanding of distribution of calorie intake throughout the day with the consumption of 4-5 meals/snacks    Lipids  Yes    Intervention  Provide education and support for participant on nutrition & aerobic/resistive exercise along with prescribed medications to achieve LDL <71m HDL >4063m   Expected Outcomes  Short Term: Participant states understanding of desired cholesterol values and is compliant with medications prescribed. Participant is following exercise prescription and nutrition guidelines.;Long Term: Cholesterol controlled with medications as prescribed, with individualized exercise RX and with personalized nutrition plan. Value goals: LDL < 51m79mDL > 40 mg.       Core Components/Risk Factors/Patient Goals Review:  Goals and Risk Factor Review    Row Name 05/04/19 0923             Core Components/Risk Factors/Patient Goals Review   Personal Goals Review  Weight Management/Obesity;Hypertension       Review  Overall she is doing well.  Her weight has stayed steady and her  blood pressures have been doing well.  She does have a follow up scheduled with the surgeon on Wednesday of this week for increased incisional soreness and right sided tightness in her chest.  She is 4 month post surgery so within the normal window for pain, but she does have a chest xray scheduled for that day to make sure nothing else is going on.  She will keep us pKoreated.       Expected Outcomes  Short: Talk to doctor about chest/incisional pain.  Long: Continue to monitor risk factors          Core Components/Risk Factors/Patient Goals at Discharge (Final Review):  Goals and Risk Factor Review - 05/04/19 0923      Core Components/Risk Factors/Patient Goals Review   Personal Goals Review  Weight Management/Obesity;Hypertension    Review  Overall she is doing well.  Her weight has stayed steady and her blood pressures have been doing well.  She does have a follow up scheduled with the surgeon on Wednesday of this week for increased incisional soreness and right sided tightness in her chest.  She is 4 month post surgery so within the normal window for pain, but she does have a chest xray scheduled for that day to make sure nothing else is going on.  She will keep us pKoreated.    Expected Outcomes  Short: Talk to doctor about chest/incisional pain.  Long: Continue to monitor risk factors       ITP Comments: ITP Comments    Row Name 04/14/19 1656 04/15/19 1519 05/04/19 0918 05/08/19 1119 05/18/19 1215   ITP Comments  Initial Cardiac Home Based Care visit completed Intake and Consent completed Appts for followup done with EP and RD  Completed initial ExRx created and sent to Dr. Emily Filbert, Medical Director to review and sign.  Completed virtual visit. Scheduled to orient on 7/17 and 7/21.  Orientation via phone completed today   Gym orientation with  EP/RD Eval is scheduled for 7/27 at 11 AM.  Deairra is having surgery to remove the sternal wires on Mon 7/20.  Completed 6MWT and gym orientation  today.  Documentation for diagnosis can be found in [facility] encounter 01/05/19.  Initial ITP created and sent for review to Dr. Emily Filbert, Medical Director.   [place] Name 06/03/19 0605           ITP Comments  30 Day Review Completed today. Continue with ITP unless changed by Medical Director review.  New to program          Comments:

## 2019-06-05 ENCOUNTER — Other Ambulatory Visit: Payer: Self-pay

## 2019-06-05 DIAGNOSIS — Z951 Presence of aortocoronary bypass graft: Secondary | ICD-10-CM

## 2019-06-05 NOTE — Progress Notes (Signed)
Daily Session Note  Patient Details  Name: Jessica [name] MRN: [id number] Date of Birth: July 18, 1951 Referring Provider:     Cardiac Rehab from 05/18/2019 in [facility] Cardiac and Pulmonary Rehab  Referring Provider  Quay Burow MD      Encounter Date: 06/05/2019  Check In:      Social History   Tobacco Use  Smoking Status Never Smoker  Smokeless Tobacco Never Used    Goals Met:  Independence with exercise equipment Exercise tolerated well No report of cardiac concerns or symptoms Strength training completed today  Goals Unmet:  Not Applicable  Comments: Pt able to follow exercise prescription today without complaint.  Will continue to monitor for progression.    Dr. Emily Filbert is Medical Director for [place] and [facility].

## 2019-06-08 ENCOUNTER — Encounter: Payer: Medicare Other | Admitting: *Deleted

## 2019-06-08 ENCOUNTER — Other Ambulatory Visit: Payer: Self-pay

## 2019-06-08 DIAGNOSIS — Z951 Presence of aortocoronary bypass graft: Secondary | ICD-10-CM | POA: Diagnosis not present

## 2019-06-08 NOTE — Progress Notes (Signed)
Daily Session Note  Patient Details  Name: Jessica [name] MRN: [id number] Date of Birth: [birth year] Referring Provider:     Cardiac Rehab from 05/18/2019 in [facility] Cardiac and Pulmonary Rehab  Referring Provider  Quay Burow MD      Encounter Date: 06/08/2019  Check In: Session Check In - 06/08/19 0809      Check-In   Supervising physician immediately available to respond to emergencies  See telemetry face sheet for immediately available ER MD    Location  [facility]-Cardiac & Pulmonary Rehab    Staff Present  Heath Lark, RN, BSN, Laveda Norman, BS, ACSM CEP, Exercise Physiologist;Jessica Wiley, [place], RCEP, CCRP, CCET    Virtual Visit  No    Medication changes reported      No    Fall or balance concerns reported     No    Warm-up and Cool-down  Performed on first and last piece of equipment    Resistance Training Performed  Yes    VAD Patient?  No    PAD/SET Patient?  No      Pain Assessment   Currently in Pain?  No/denies          Social History   Tobacco Use  Smoking Status Never Smoker  Smokeless Tobacco Never Used    Goals Met:  Independence with exercise equipment Exercise tolerated well No report of cardiac concerns or symptoms Strength training completed today  Goals Unmet:  Not Applicable  Comments: Pt able to follow exercise prescription today without complaint.  Will continue to monitor for progression.    Dr. Emily Filbert is Medical Director for [place] and [facility].

## 2019-06-10 ENCOUNTER — Other Ambulatory Visit: Payer: Self-pay

## 2019-06-10 ENCOUNTER — Encounter: Payer: Medicare Other | Admitting: *Deleted

## 2019-06-10 DIAGNOSIS — Z951 Presence of aortocoronary bypass graft: Secondary | ICD-10-CM

## 2019-06-10 NOTE — Progress Notes (Signed)
Daily Session Note  Patient Details  Name: Jessica [name] MRN: [id number] Date of Birth: 1950-11-25 Referring Provider:     Cardiac Rehab from 05/18/2019 in [facility] Cardiac and Pulmonary Rehab  Referring Provider  Quay Burow MD      Encounter Date: 06/10/2019  Check In: Session Check In - 06/10/19 0757      Check-In   Supervising physician immediately available to respond to emergencies  See telemetry face sheet for immediately available ER MD    Location  [facility]-Cardiac & Pulmonary Rehab    Staff Present  Darel Hong, RN BSN;Jessica Luan Pulling, MA, RCEP, CCRP, CCET    Virtual Visit  No    Medication changes reported      No    Fall or balance concerns reported     No    Warm-up and Cool-down  Performed on first and last piece of equipment    Resistance Training Performed  Yes    VAD Patient?  No    PAD/SET Patient?  No      Pain Assessment   Currently in Pain?  No/denies          Social History   Tobacco Use  Smoking Status Never Smoker  Smokeless Tobacco Never Used    Goals Met:  Independence with exercise equipment Exercise tolerated well No report of cardiac concerns or symptoms Strength training completed today  Goals Unmet:  Not Applicable  Comments: Pt able to follow exercise prescription today without complaint.  Will continue to monitor for progression.    Dr. Emily Filbert is Medical Director for [place] and [facility].

## 2019-06-12 ENCOUNTER — Other Ambulatory Visit: Payer: Self-pay

## 2019-06-12 ENCOUNTER — Encounter: Payer: Medicare Other | Admitting: *Deleted

## 2019-06-12 DIAGNOSIS — Z951 Presence of aortocoronary bypass graft: Secondary | ICD-10-CM

## 2019-06-12 NOTE — Progress Notes (Signed)
Daily Session Note  Patient Details  Name: Jessica [name] MRN: [id number] Date of Birth: 10-04-1951 Referring Provider:     Cardiac Rehab from 05/18/2019 in [facility] Cardiac and Pulmonary Rehab  Referring Provider  Quay Burow MD      Encounter Date: 06/12/2019  Check In: Session Check In - 06/12/19 0802      Check-In   Supervising physician immediately available to respond to emergencies  See telemetry face sheet for immediately available ER MD    Location  [facility]-Cardiac & Pulmonary Rehab    Staff Present  Heath Lark, RN, BSN, CCRP;Jessica Marissa, MA, RCEP, CCRP, [place], [place], ACSM CEP, Exercise Physiologist    Virtual Visit  No    Medication changes reported      No    Fall or balance concerns reported     No    Warm-up and Cool-down  Performed on first and last piece of equipment    Resistance Training Performed  Yes    VAD Patient?  No    PAD/SET Patient?  No      Pain Assessment   Currently in Pain?  No/denies          Social History   Tobacco Use  Smoking Status Never Smoker  Smokeless Tobacco Never Used    Goals Met:  Exercise tolerated well No report of cardiac concerns or symptoms Strength training completed today  Goals Unmet:  Not Applicable  Comments: Pt able to follow exercise prescription today without complaint.  Will continue to monitor for progression.    Dr. Emily Filbert is Medical Director for [place] and [facility].

## 2019-06-15 ENCOUNTER — Encounter: Payer: Medicare Other | Admitting: *Deleted

## 2019-06-15 ENCOUNTER — Other Ambulatory Visit: Payer: Self-pay

## 2019-06-15 DIAGNOSIS — Z951 Presence of aortocoronary bypass graft: Secondary | ICD-10-CM

## 2019-06-15 NOTE — Progress Notes (Signed)
Daily Session Note  Patient Details  Name: Jessica [name] MRN: [id number] Date of Birth: January 01, 1951 Referring Provider:     Cardiac Rehab from 05/18/2019 in [facility] Cardiac and Pulmonary Rehab  Referring Provider  Quay Burow MD      Encounter Date: 06/15/2019  Check In: Session Check In - 06/15/19 0819      Check-In   Supervising physician immediately available to respond to emergencies  See telemetry face sheet for immediately available ER MD    Location  [facility]-Cardiac & Pulmonary Rehab    Staff Present  Heath Lark, RN, BSN, Laveda Norman, BS, ACSM CEP, Exercise Physiologist;Jeanna Durrell BS, Exercise Physiologist    Virtual Visit  No    Medication changes reported      No    Fall or balance concerns reported     No    Warm-up and Cool-down  Performed on first and last piece of equipment    Resistance Training Performed  Yes    VAD Patient?  No    PAD/SET Patient?  No      Pain Assessment   Currently in Pain?  No/denies          Social History   Tobacco Use  Smoking Status Never Smoker  Smokeless Tobacco Never Used    Goals Met:  Independence with exercise equipment Exercise tolerated well Personal goals reviewed No report of cardiac concerns or symptoms Strength training completed today  Goals Unmet:  Not Applicable  Comments: Pt able to follow exercise prescription today without complaint.  Will continue to monitor for progression.    Dr. Emily Filbert is Medical Director for [place] and [facility].

## 2019-06-16 ENCOUNTER — Ambulatory Visit (INDEPENDENT_AMBULATORY_CARE_PROVIDER_SITE_OTHER): Payer: Medicare Other | Admitting: Internal Medicine

## 2019-06-16 ENCOUNTER — Encounter: Payer: Self-pay | Admitting: Internal Medicine

## 2019-06-16 ENCOUNTER — Other Ambulatory Visit (INDEPENDENT_AMBULATORY_CARE_PROVIDER_SITE_OTHER): Payer: Medicare Other

## 2019-06-16 VITALS — BP 104/70 | HR 57 | Temp 98.4°F | Ht 66.0 in | Wt 186.0 lb

## 2019-06-16 DIAGNOSIS — R0602 Shortness of breath: Secondary | ICD-10-CM

## 2019-06-16 DIAGNOSIS — Z951 Presence of aortocoronary bypass graft: Secondary | ICD-10-CM | POA: Diagnosis not present

## 2019-06-16 DIAGNOSIS — R252 Cramp and spasm: Secondary | ICD-10-CM

## 2019-06-16 DIAGNOSIS — Z23 Encounter for immunization: Secondary | ICD-10-CM

## 2019-06-16 LAB — LIPID PANEL
Cholesterol: 200 mg/dL (ref 0–200)
HDL: 33.2 mg/dL — ABNORMAL LOW (ref >39.00)
LDL Cholesterol: 145 mg/dL — ABNORMAL HIGH (ref 0–99)
NonHDL: 167.18
Total CHOL/HDL Ratio: 6
Triglycerides: 109 mg/dL (ref 0.0–149.0)
VLDL: 21.8 mg/dL (ref 0.0–40.0)

## 2019-06-16 LAB — CBC
HCT: 39.2 % (ref 36.0–46.0)
Hemoglobin: 12.7 g/dL (ref 12.0–15.0)
MCHC: 32.3 g/dL (ref 30.0–36.0)
MCV: 86.5 fl (ref 78.0–100.0)
Platelets: 280 10*3/uL (ref 150.0–400.0)
RBC: 4.53 Mil/uL (ref 3.87–5.11)
RDW: 14.4 % (ref 11.5–15.5)
WBC: 4.4 10*3/uL (ref 4.0–10.5)

## 2019-06-16 LAB — COMPREHENSIVE METABOLIC PANEL
ALT: 20 U/L (ref 0–35)
AST: 25 U/L (ref 0–37)
Albumin: 4.5 g/dL (ref 3.5–5.2)
Alkaline Phosphatase: 67 U/L (ref 39–117)
BUN: 14 mg/dL (ref 6–23)
CO2: 26 mEq/L (ref 19–32)
Calcium: 9.6 mg/dL (ref 8.4–10.5)
Chloride: 105 mEq/L (ref 96–112)
Creatinine, Ser: 0.98 mg/dL (ref 0.40–1.20)
GFR: 68.13 mL/min (ref >60.00)
Glucose, Bld: 96 mg/dL (ref 70–99)
Potassium: 3.9 mEq/L (ref 3.5–5.1)
Sodium: 140 mEq/L (ref 135–145)
Total Bilirubin: 0.4 mg/dL (ref 0.2–1.2)
Total Protein: 7.6 g/dL (ref 6.0–8.3)

## 2019-06-16 LAB — VITAMIN D 25 HYDROXY (VIT D DEFICIENCY, FRACTURES): VITD: 36.46 ng/mL (ref 30.00–100.00)

## 2019-06-16 LAB — MAGNESIUM: Magnesium: 1.5 mg/dL (ref 1.5–2.5)

## 2019-06-16 LAB — BRAIN NATRIURETIC PEPTIDE: Pro B Natriuretic peptide (BNP): 27 pg/mL (ref 0.0–100.0)

## 2019-06-16 LAB — TSH: TSH: 1.94 u[IU]/mL (ref 0.35–4.50)

## 2019-06-16 LAB — VITAMIN B12: Vitamin B-12: 469 pg/mL (ref 211–911)

## 2019-06-16 LAB — HEMOGLOBIN A1C: Hgb A1c MFr Bld: 6.4 % (ref 4.6–6.5)

## 2019-06-16 NOTE — Patient Instructions (Signed)
We will check the labs today and if we cannot find a cause we will check the blood flow in the legs.

## 2019-06-16 NOTE — Progress Notes (Signed)
   Subjective:   Patient ID: Jessica [name], female    DOB: 05-02-1951, 68 y.o.   MRN: [id number]  HPI The patient is a 68 YO female coming in for concerns about cramps in legs (worse in the evening but all the time too, is able to walk them out some, just started in the last 2 weeks, has tried walking them out) as well as leg tiredness (sometimes getting fatigued with walking at home, during rehab she is not having this problem, not on a fluid pill, denies poor appetite and tries to hydrate at home). I have not followed her up since her cabg times 5 (was having angina and we sent to ER for evaluation and admitted for stress test and then cath and then cabg, had sternal wires removed back in July for persistent pain, doing cardiac rehab still, overall improved although exercise tolerance not back to normal yet).   PMH, [facility], social history reviewed and updated  Review of Systems  Constitutional: Positive for activity change and fatigue.  HENT: Negative.   Eyes: Negative.   Respiratory: Positive for shortness of breath. Negative for cough and chest tightness.   Cardiovascular: Negative for chest pain, palpitations and leg swelling.  Gastrointestinal: Negative for abdominal distention, abdominal pain, constipation, diarrhea, nausea and vomiting.  Musculoskeletal: Positive for myalgias.  Skin: Negative.   Neurological: Negative.   Psychiatric/Behavioral: Negative.     Objective:  Physical Exam Constitutional:      Appearance: She is well-developed.  HENT:     Head: Normocephalic and atraumatic.  Neck:     Musculoskeletal: Normal range of motion.  Cardiovascular:     Rate and Rhythm: Normal rate and regular rhythm.     Comments: Sternal wound healing appropriately Pulmonary:     Effort: Pulmonary effort is normal. No respiratory distress.     Breath sounds: Normal breath sounds. No wheezing or rales.  Abdominal:     General: Bowel sounds are normal. There is no distension.   Palpations: Abdomen is soft.     Tenderness: There is no abdominal tenderness. There is no rebound.  Musculoskeletal:        General: No tenderness.     Right lower leg: No edema.     Left lower leg: No edema.  Skin:    General: Skin is warm and dry.  Neurological:     Mental Status: She is alert and oriented to person, place, and time.     Coordination: Coordination normal.     Vitals:   06/16/19 1034  BP: 104/70  Pulse: (!) 57  Temp: 98.4 F (36.9 C)  TempSrc: Oral  SpO2: 97%  Weight: 186 lb (84.4 kg)  Height: 5\' 6"  (1.676 m)    Assessment & Plan:  Flu shot given at visit

## 2019-06-17 ENCOUNTER — Encounter: Payer: Medicare Other | Admitting: *Deleted

## 2019-06-17 ENCOUNTER — Other Ambulatory Visit: Payer: Self-pay

## 2019-06-17 DIAGNOSIS — Z951 Presence of aortocoronary bypass graft: Secondary | ICD-10-CM

## 2019-06-17 NOTE — Progress Notes (Signed)
Daily Session Note  Patient Details  Name: Jessica [name] MRN: [id number] Date of Birth: 03-02-1951 Referring Provider:     Cardiac Rehab from 05/18/2019 in [facility] Cardiac and Pulmonary Rehab  Referring Provider  Quay Burow MD      Encounter Date: 06/17/2019  Check In:      Social History   Tobacco Use  Smoking Status Never Smoker  Smokeless Tobacco Never Used    Goals Met:  Independence with exercise equipment Exercise tolerated well No report of cardiac concerns or symptoms Strength training completed today  Goals Unmet:  Not Applicable  Comments: Pt able to follow exercise prescription today without complaint.  Will continue to monitor for progression.    Dr. Emily Filbert is Medical Director for [place] and [facility].

## 2019-06-17 NOTE — Progress Notes (Signed)
Daily Session Note  Patient Details  Name: Jessica [name] MRN: [id number] Date of Birth: Apr 05, 1951 Referring Provider:     Cardiac Rehab from 05/18/2019 in [facility] Cardiac and Pulmonary Rehab  Referring Provider  Quay Burow MD      Encounter Date: 06/17/2019  Check In: Session Check In - 06/17/19 0855      Check-In   Supervising physician immediately available to respond to emergencies  See telemetry face sheet for immediately available ER MD    Location  [facility]-Cardiac & Pulmonary Rehab    Staff Present  Heath Lark, RN, BSN, CCRP;Jeanna Durrell BS, Exercise Physiologist;Jessica [place], MA, RCEP, CCRP, CCET    Virtual Visit  No    Medication changes reported      No    Fall or balance concerns reported     No    Warm-up and Cool-down  Performed on first and last piece of equipment    Resistance Training Performed  Yes    VAD Patient?  No    PAD/SET Patient?  No      Pain Assessment   Currently in Pain?  No/denies          Social History   Tobacco Use  Smoking Status Never Smoker  Smokeless Tobacco Never Used    Goals Met:  Independence with exercise equipment Exercise tolerated well No report of cardiac concerns or symptoms Strength training completed today  Goals Unmet:  Not Applicable  Comments: Pt able to follow exercise prescription today without complaint.  Will continue to monitor for progression.    Dr. Emily Filbert is Medical Director for [place] and [facility].

## 2019-06-19 ENCOUNTER — Telehealth: Payer: Self-pay

## 2019-06-19 DIAGNOSIS — R0602 Shortness of breath: Secondary | ICD-10-CM | POA: Insufficient documentation

## 2019-06-19 DIAGNOSIS — R252 Cramp and spasm: Secondary | ICD-10-CM | POA: Insufficient documentation

## 2019-06-19 NOTE — Assessment & Plan Note (Signed)
Checking CMP, CBC, magnesium to rule out electrolyte abnormality. Adjust as needed. Advised she can take tums to help with cramps.

## 2019-06-19 NOTE — Assessment & Plan Note (Signed)
Checking BNP and CBC to rule out anemia and heart failure. She recently had CABG so may still be recovering from this. Lung exam normal.

## 2019-06-19 NOTE — Telephone Encounter (Signed)
Jessica [name] called in to say she would not be able to attend today - she is "down in her back"  She plans to come Monday

## 2019-06-19 NOTE — Assessment & Plan Note (Signed)
Checking BNP given SOB at home. No rales on exam or edema in the legs. Given reassurance and advised to hydrate well at home with exercise to help.

## 2019-06-22 ENCOUNTER — Other Ambulatory Visit: Payer: Self-pay

## 2019-06-22 ENCOUNTER — Encounter: Payer: Medicare Other | Admitting: *Deleted

## 2019-06-22 DIAGNOSIS — Z951 Presence of aortocoronary bypass graft: Secondary | ICD-10-CM

## 2019-06-22 NOTE — Progress Notes (Signed)
Daily Session Note  Patient Details  Name: Jessica [name] MRN: [id number] Date of Birth: [birth year] Referring Provider:     Cardiac Rehab from 05/18/2019 in [facility] Cardiac and Pulmonary Rehab  Referring Provider  Quay Burow MD      Encounter Date: 06/22/2019  Check In: Session Check In - 06/22/19 0828      Check-In   Supervising physician immediately available to respond to emergencies  See telemetry face sheet for immediately available ER MD    Location  [facility]-Cardiac & Pulmonary Rehab    Staff Present  Jasper Loser BS, Exercise Physiologist;Kelly Amedeo Plenty, BS, ACSM CEP, Exercise Physiologist;Matayah Reyburn, RN, BSN, CCRP    Virtual Visit  No    Medication changes reported      No    Fall or balance concerns reported     No    Warm-up and Cool-down  Performed on first and last piece of equipment    Resistance Training Performed  Yes    VAD Patient?  No    PAD/SET Patient?  No      Pain Assessment   Currently in Pain?  No/denies          Social History   Tobacco Use  Smoking Status Never Smoker  Smokeless Tobacco Never Used    Goals Met:  Independence with exercise equipment Exercise tolerated well No report of cardiac concerns or symptoms Strength training completed today  Goals Unmet:  Not Applicable  Comments: Pt able to follow exercise prescription today without complaint.  Will continue to monitor for progression.    Dr. Emily Filbert is Medical Director for [place] and [facility].

## 2019-06-24 ENCOUNTER — Encounter: Payer: Medicare Other | Attending: Cardiovascular Disease | Admitting: *Deleted

## 2019-06-24 ENCOUNTER — Other Ambulatory Visit: Payer: Self-pay

## 2019-06-24 DIAGNOSIS — Z951 Presence of aortocoronary bypass graft: Secondary | ICD-10-CM

## 2019-06-24 NOTE — Progress Notes (Signed)
Daily Session Note  Patient Details  Name: CARMELINA BALDUCCI MRN: [id number] Date of Birth: 24-May-1951 Referring Provider:     Cardiac Rehab from 05/18/2019 in [facility] Cardiac and Pulmonary Rehab  Referring Provider  Quay Burow MD      Encounter Date: 06/24/2019  Check In: Session Check In - 06/24/19 0802      Check-In   Supervising physician immediately available to respond to emergencies  See telemetry face sheet for immediately available ER MD    Location  [facility]-Cardiac & Pulmonary Rehab    Staff Present  Alberteen Sam, MA, RCEP, CCRP, CCET;Joseph Christain [place], RN BSN    Virtual Visit  No    Medication changes reported      No    Fall or balance concerns reported     No    Warm-up and Cool-down  Performed on first and last piece of equipment    Resistance Training Performed  Yes    VAD Patient?  No    PAD/SET Patient?  No      Pain Assessment   Currently in Pain?  No/denies          Social History   Tobacco Use  Smoking Status Never Smoker  Smokeless Tobacco Never Used    Goals Met:  Independence with exercise equipment Exercise tolerated well No report of cardiac concerns or symptoms Strength training completed today  Goals Unmet:  Not Applicable  Comments: Pt able to follow exercise prescription today without complaint.  Will continue to monitor for progression.    Dr. Emily Filbert is Medical Director for [place] and [facility].

## 2019-06-26 ENCOUNTER — Other Ambulatory Visit: Payer: Self-pay

## 2019-06-26 DIAGNOSIS — Z951 Presence of aortocoronary bypass graft: Secondary | ICD-10-CM

## 2019-06-26 NOTE — Progress Notes (Signed)
Daily Session Note  Patient Details  Name: Jessica [name] MRN: [id number] Date of Birth: October 08, 1951 Referring Provider:     Cardiac Rehab from 05/18/2019 in [facility] Cardiac and Pulmonary Rehab  Referring Provider  Quay Burow MD      Encounter Date: 06/26/2019  Check In: Session Check In - 06/26/19 0758      Check-In   Supervising physician immediately available to respond to emergencies  See telemetry face sheet for immediately available ER MD    Location  [facility]-Cardiac & Pulmonary Rehab    Staff Present  Vida Rigger RN, BSN;Jessica Luan Pulling, MA, RCEP, CCRP, CCET;Joseph Hood RCP,RRT,BSRT    Virtual Visit  No    Medication changes reported      No    Fall or balance concerns reported     No    Warm-up and Cool-down  Performed on first and last piece of equipment    Resistance Training Performed  Yes    VAD Patient?  No    PAD/SET Patient?  No      Pain Assessment   Currently in Pain?  No/denies    Multiple Pain Sites  No          Social History   Tobacco Use  Smoking Status Never Smoker  Smokeless Tobacco Never Used    Goals Met:  Independence with exercise equipment Exercise tolerated well No report of cardiac concerns or symptoms Strength training completed today  Goals Unmet:  Not Applicable  Comments: Pt able to follow exercise prescription today without complaint.  Will continue to monitor for progression.   Dr. Emily Filbert is Medical Director for [place] and [facility].

## 2019-07-01 ENCOUNTER — Encounter: Payer: Self-pay | Admitting: *Deleted

## 2019-07-01 ENCOUNTER — Encounter: Payer: Medicare Other | Admitting: *Deleted

## 2019-07-01 ENCOUNTER — Other Ambulatory Visit: Payer: Self-pay

## 2019-07-01 DIAGNOSIS — Z951 Presence of aortocoronary bypass graft: Secondary | ICD-10-CM

## 2019-07-01 NOTE — Progress Notes (Signed)
Daily Session Note  Patient Details  Name: Jessica [name] MRN: [id number] Date of Birth: September 29, 1951 Referring Provider:     Cardiac Rehab from 05/18/2019 in [facility] Cardiac and Pulmonary Rehab  Referring Provider  Quay Burow MD      Encounter Date: 07/01/2019  Check In: Session Check In - 07/01/19 0805      Check-In   Supervising physician immediately available to respond to emergencies  See telemetry face sheet for immediately available ER MD    Location  [facility]-Cardiac & Pulmonary Rehab    Staff Present  Heath Lark, RN, BSN, CCRP;Joseph Hood RCP,RRT,BSRT;Jessica Pink, [place], [place], [place], CCET    Virtual Visit  No    Medication changes reported      No    Fall or balance concerns reported     No    Warm-up and Cool-down  Performed on first and last piece of equipment    Resistance Training Performed  Yes    VAD Patient?  No    PAD/SET Patient?  No      Pain Assessment   Currently in Pain?  No/denies          Social History   Tobacco Use  Smoking Status Never Smoker  Smokeless Tobacco Never Used    Goals Met:  Independence with exercise equipment Exercise tolerated well No report of cardiac concerns or symptoms Strength training completed today  Goals Unmet:  Not Applicable  Comments: Pt able to follow exercise prescription today without complaint.  Will continue to monitor for progression.    Dr. Emily Filbert is Medical Director for [place] and [facility].

## 2019-07-01 NOTE — Progress Notes (Signed)
Cardiac Individual Treatment Plan  Patient Details  Name: Jessica [name] MRN: [id number] Date of Birth: 01/07/1951 Referring Provider:     Cardiac Rehab from 05/18/2019 in [facility] Cardiac and Pulmonary Rehab  Referring Provider  Quay Burow MD      Initial Encounter Date:    Cardiac Rehab from 05/18/2019 in [facility] Cardiac and Pulmonary Rehab  Date  05/18/19      Visit Diagnosis: S/P CABG x 5  Patient's Home Medications on Admission:  Current Outpatient Medications:  .  Ascorbic Acid (VITAMIN C) 1000 MG tablet, Take 2,000 mg by mouth daily., Disp: , Rfl:  .  aspirin EC 325 MG EC tablet, Take 1 tablet (325 mg total) by mouth daily. (Patient taking differently: Take 325 mg by mouth at bedtime. ), Disp: , Rfl: 0 .  Cholecalciferol (VITAMIN D-3) 125 MCG (5000 UT) TABS, Take 5,000 Units by mouth daily., Disp: , Rfl:  .  Digestive Enzymes (DIGESTIVE ENZYME PO), Take 1 capsule by mouth 2 (two) times daily. , Disp: , Rfl:  .  loratadine (CLARITIN) 10 MG tablet, Take 10 mg by mouth every evening. , Disp: , Rfl:  .  metoprolol tartrate (LOPRESSOR) 25 MG tablet, Take 0.5 tablets (12.5 mg total) by mouth 2 (two) times daily., Disp: 30 tablet, Rfl: 11 .  Multiple Vitamin (MULTIVITAMIN WITH MINERALS) TABS tablet, Take 1 tablet by mouth daily., Disp: , Rfl:  .  rosuvastatin (CRESTOR) 20 MG tablet, Start with 1 tablet three times weekly and increase to 1 tablet daily as tolerated, Disp: 30 tablet, Rfl: 5  Past Medical History: Past Medical History:  Diagnosis Date  . Allergy   . Breast cancer ([place]) 2000  . Cancer [facility])    breast  . Colon polyp   . Coronary artery disease   . GERD (gastroesophageal reflux disease)   . History of blood transfusion   . Hyperlipidemia   . Migraine    history of migrarines, none as an adult  . Personal history of radiation therapy 2000    Tobacco Use: Social History   Tobacco Use  Smoking Status Never Smoker  Smokeless Tobacco Never Used     Labs: Recent Review Flowsheet Data    Labs for ITP Cardiac and Pulmonary Rehab Latest Ref Rng & Units 01/05/2019 01/05/2019 01/05/2019 03/12/2019 06/16/2019   Cholestrol 0 - 200 mg/dL - - - 113 200   LDLCALC 0 - 99 mg/dL - - - 63 145(H)   HDL >39.00 mg/dL - - - 33(L) 33.20(L)   Trlycerides 0.0 - 149.0 mg/dL - - - 83 109.0   Hemoglobin A1c 4.6 - 6.5 % - - - - 6.4   PHART 7.350 - 7.450 7.380 7.315(L) 7.265(L) - -   PCO2ART 32.0 - 48.0 mmHg 38.0 47.1 49.9(H) - -   HCO3 20.0 - 28.0 mmol/L 22.9 23.9 22.6 - -   TCO2 22 - 32 mmol/L 24 25 24  - -   ACIDBASEDEF 0.0 - 2.0 mmol/L 3.0(H) 2.0 4.0(H) - -   O2SAT % 99.0 97.0 98.0 - -       Exercise Target Goals: Exercise Program Goal: Individual exercise prescription set using results from initial 6 min walk test and THRR while considering  patient's activity barriers and safety.   Exercise Prescription Goal: Initial exercise prescription builds to 30-45 minutes a day of aerobic activity, 2-3 days per week.  Home exercise guidelines will be given to patient during program as part of exercise prescription that the participant will  acknowledge.  Activity Barriers & Risk Stratification: Activity Barriers & Cardiac Risk Stratification - 05/18/19 1217      Activity Barriers & Cardiac Risk Stratification   Activity Barriers  Deconditioning;Muscular Weakness;Incisional Pain   sternal wire removed on 05/11/19   Cardiac Risk Stratification  High       6 Minute Walk: 6 Minute Walk    Row Name 05/18/19 1216         6 Minute Walk   Phase  Initial     Distance  1770 feet     Walk Time  6 minutes     # of Rest Breaks  0     MPH  3.35     METS  3.86     RPE  7     VO2 Peak  13.49     Symptoms  No     Resting HR  73 bpm     Resting BP  136/70     Resting Oxygen Saturation   99 %     Exercise Oxygen Saturation  during 6 min walk  99 %     Max Ex. HR  107 bpm     Max Ex. BP  144/76     2 Minute Post BP  136/70        Oxygen Initial  Assessment:   Oxygen Re-Evaluation:   Oxygen Discharge (Final Oxygen Re-Evaluation):   Initial Exercise Prescription: Initial Exercise Prescription - 05/18/19 1200      Date of Initial Exercise RX and Referring Provider   Date  05/18/19    Referring Provider  Quay Burow MD      Treadmill   MPH  3.3    Grade  2    Minutes  15    METs  4.21      NuStep   Level  3    SPM  80    Minutes  15    METs  3      Recumbant Elliptical   Level  2    RPM  50    Minutes  15    METs  3      Prescription Details   Frequency (times per week)  3    Duration  Progress to 30 minutes of continuous aerobic without signs/symptoms of physical distress      Intensity   THRR 40-80% of Max Heartrate  105-136    Ratings of Perceived Exertion  11-13    Perceived Dyspnea  0-4      Progression   Progression  Continue to progress workloads to maintain intensity without signs/symptoms of physical distress.      Resistance Training   Training Prescription  Yes    Weight  4 lbs    Reps  10-15       Perform Capillary Blood Glucose checks as needed.  Exercise Prescription Changes: Exercise Prescription Changes    Row Name 05/18/19 1200 05/25/19 1000 06/01/19 0800 06/12/19 1100 06/24/19 1300     Response to Exercise   Blood Pressure (Admit)  136/70  128/74  -  122/70  140/80   Blood Pressure (Exercise)  144/76  126/64  -  134/72  140/62   Blood Pressure (Exit)  136/70  124/72  -  108/70  130/80   Heart Rate (Admit)  73 bpm  57 bpm  -  66 bpm  72 bpm   Heart Rate (Exercise)  107 bpm  110 bpm  -  108 bpm  82 bpm   Heart Rate (Exit)  66 bpm  84 bpm  -  73 bpm  66 bpm   Oxygen Saturation (Admit)  99 %  -  -  -  -   Oxygen Saturation (Exercise)  99 %  -  -  -  -   Rating of Perceived Exertion (Exercise)  7  12  -  12  12   Symptoms  none  none   -  none  none   Comments  walk test results  second full day of exercise  -  -  -   Duration  -  Continue with 30 min of aerobic exercise  without signs/symptoms of physical distress.  -  Continue with 30 min of aerobic exercise without signs/symptoms of physical distress.  Continue with 30 min of aerobic exercise without signs/symptoms of physical distress.   Intensity  -  THRR unchanged  -  THRR unchanged  THRR unchanged     Progression   Progression  -  Continue to progress workloads to maintain intensity without signs/symptoms of physical distress.  -  Continue to progress workloads to maintain intensity without signs/symptoms of physical distress.  Continue to progress workloads to maintain intensity without signs/symptoms of physical distress.   Average METs  -  3.52  -  3.67  3.6     Resistance Training   Training Prescription  -  Yes  -  Yes  Yes   Weight  -  4 lbs  -  4 lb  4 lb   Reps  -  10-15  -  10-15  10-15     Interval Training   Interval Training  -  No  -  No  -     Treadmill   MPH  -  3.3  -  3.5  3.5   Grade  -  2  -  2  2   Minutes  -  15  -  15  15   METs  -  4.44  -  4.64  4.64     NuStep   Level  -  3  -  4  4   SPM  -  -  -  80  80   Minutes  -  15  -  15  15   METs  -  2.6  -  2.6  2.4     Recumbant Elliptical   Level  -  -  -  -  2   RPM  -  -  -  -  50   Minutes  -  -  -  -  15   METs  -  -  -  -  1.5     Home Exercise Plan   Plans to continue exercise at  -  -  Home (comment) walking, weights  Home (comment) walking, weights  Home (comment) walking, weights   Frequency  -  -  Add 1 additional day to program exercise sessions.  Add 1 additional day to program exercise sessions.  Add 1 additional day to program exercise sessions.   Initial Home Exercises Provided  -  -  06/01/19  06/01/19  -      Exercise Comments: Exercise Comments    Row Name 05/22/19 0750           Exercise Comments  First full day of exercise!  Patient was oriented to gym and equipment including functions, settings,  policies, and procedures.  Patient's individual exercise prescription and treatment plan were  reviewed.  All starting workloads were established based on the results of the 6 minute walk test done at initial orientation visit.  The plan for exercise progression was also introduced and progression will be customized based on patient's performance and goals.          Exercise Goals and Review: Exercise Goals    Row Name 04/15/19 1521 05/18/19 1219           Exercise Goals   Increase Physical Activity  Yes  Yes      Intervention  Provide advice, education, support and counseling about physical activity/exercise needs.;Develop an individualized exercise prescription for aerobic and resistive training based on initial evaluation findings, risk stratification, comorbidities and participant's personal goals.  Provide advice, education, support and counseling about physical activity/exercise needs.;Develop an individualized exercise prescription for aerobic and resistive training based on initial evaluation findings, risk stratification, comorbidities and participant's personal goals.      Expected Outcomes  Short Term: Attend rehab on a regular basis to increase amount of physical activity.;Long Term: Add in home exercise to make exercise part of routine and to increase amount of physical activity.;Long Term: Exercising regularly at least 3-5 days a week.  Short Term: Attend rehab on a regular basis to increase amount of physical activity.;Long Term: Add in home exercise to make exercise part of routine and to increase amount of physical activity.;Long Term: Exercising regularly at least 3-5 days a week.      Increase Strength and Stamina  Yes  Yes      Intervention  Provide advice, education, support and counseling about physical activity/exercise needs.;Develop an individualized exercise prescription for aerobic and resistive training based on initial evaluation findings, risk stratification, comorbidities and participant's personal goals.  Provide advice, education, support and counseling about  physical activity/exercise needs.;Develop an individualized exercise prescription for aerobic and resistive training based on initial evaluation findings, risk stratification, comorbidities and participant's personal goals.      Expected Outcomes  Short Term: Increase workloads from initial exercise prescription for resistance, speed, and METs.;Short Term: Perform resistance training exercises routinely during rehab and add in resistance training at home;Long Term: Improve cardiorespiratory fitness, muscular endurance and strength as measured by increased METs and functional capacity (6MWT)  Short Term: Increase workloads from initial exercise prescription for resistance, speed, and METs.;Short Term: Perform resistance training exercises routinely during rehab and add in resistance training at home;Long Term: Improve cardiorespiratory fitness, muscular endurance and strength as measured by increased METs and functional capacity (6MWT)      Able to understand and use rate of perceived exertion (RPE) scale  Yes  Yes      Intervention  Provide education and explanation on how to use RPE scale  Provide education and explanation on how to use RPE scale      Expected Outcomes  Short Term: Able to use RPE daily in rehab to express subjective intensity level;Long Term:  Able to use RPE to guide intensity level when exercising independently  Short Term: Able to use RPE daily in rehab to express subjective intensity level;Long Term:  Able to use RPE to guide intensity level when exercising independently      Able to understand and use Dyspnea scale  Yes  Yes      Intervention  Provide education and explanation on how to use Dyspnea scale  Provide education and explanation on how to use Dyspnea scale  Expected Outcomes  Short Term: Able to use Dyspnea scale daily in rehab to express subjective sense of shortness of breath during exertion;Long Term: Able to use Dyspnea scale to guide intensity level when exercising  independently  Short Term: Able to use Dyspnea scale daily in rehab to express subjective sense of shortness of breath during exertion;Long Term: Able to use Dyspnea scale to guide intensity level when exercising independently      Knowledge and understanding of Target Heart Rate Range (THRR)  Yes  Yes      Intervention  Provide education and explanation of THRR including how the numbers were predicted and where they are located for reference  Provide education and explanation of THRR including how the numbers were predicted and where they are located for reference      Expected Outcomes  Short Term: Able to state/look up THRR;Short Term: Able to use daily as guideline for intensity in rehab;Long Term: Able to use THRR to govern intensity when exercising independently  Short Term: Able to state/look up THRR;Short Term: Able to use daily as guideline for intensity in rehab;Long Term: Able to use THRR to govern intensity when exercising independently      Able to check pulse independently  Yes  Yes      Intervention  Provide education and demonstration on how to check pulse in carotid and radial arteries.;Review the importance of being able to check your own pulse for safety during independent exercise  Provide education and demonstration on how to check pulse in carotid and radial arteries.;Review the importance of being able to check your own pulse for safety during independent exercise      Expected Outcomes  Short Term: Able to explain why pulse checking is important during independent exercise;Long Term: Able to check pulse independently and accurately  Short Term: Able to explain why pulse checking is important during independent exercise;Long Term: Able to check pulse independently and accurately      Understanding of Exercise Prescription  Yes  Yes      Intervention  Provide education, explanation, and written materials on patient's individual exercise prescription  Provide education, explanation, and  written materials on patient's individual exercise prescription      Expected Outcomes  Short Term: Able to explain program exercise prescription;Long Term: Able to explain home exercise prescription to exercise independently  Short Term: Able to explain program exercise prescription;Long Term: Able to explain home exercise prescription to exercise independently         Exercise Goals Re-Evaluation : Exercise Goals Re-Evaluation    Row Name 05/04/19 5638 05/22/19 0751 05/25/19 1028 06/01/19 0807 06/12/19 1131     Exercise Goal Re-Evaluation   Exercise Goals Review  Increase Physical Activity;Increase Strength and Stamina;Understanding of Exercise Prescription  Understanding of Exercise Prescription;Increase Physical Activity;Increase Strength and Stamina;Able to understand and use rate of perceived exertion (RPE) scale;Knowledge and understanding of Target Heart Rate Range (THRR)  Increase Physical Activity;Increase Strength and Stamina;Understanding of Exercise Prescription  Increase Physical Activity;Increase Strength and Stamina;Understanding of Exercise Prescription  Increase Physical Activity;Increase Strength and Stamina;Able to understand and use rate of perceived exertion (RPE) scale;Knowledge and understanding of Target Heart Rate Range (THRR);Able to check pulse independently;Understanding of Exercise Prescription   Comments  Ellionna is doing well at home.  She has been walking some, but not consistent enough.  She is eager to come into rehab and has scheduled her orientation. She has also been doing some hand weights at home as well.  She has been aiming for 3-4 days a week for about 10-15 minutes each.  Reviewed RPE scale, THR and program prescription with pt today.  Pt voiced understanding and was given a copy of goals to take home.  Darriana is off to a good start in rehab. She has completed her first two full days of exercise.  We will continue to monitor her progression.  Felina is still  doing her one extra day a week at home.  She is also doing her weights at home.  Updated home exercise with pt today.  Pt plans to continue to walk at home for exercise.  Reviewed THR, pulse, RPE, sign and symptoms, NTG use, and when to call 911 or MD.  Also discussed weather considerations and indoor options.  Pt voiced understanding.  Kaaliyah has increased speed and grade on TM.  She is up to 4 lb for strength training.   Expected Outcomes  Short: Complete 6MWT.  Long: Attend rehab consistently  Short: Use RPE daily to regulate intensity. Long: Follow program prescription in THR.  Short: Continue to attend rehab regularly  Long: Continue to follow program prescription.  Short: Continue to attend rehab. Long: Continue to exercise independently.  Short - continue current programs at home and HT Long - improve overall MET level   Row Name 06/15/19 0814 06/24/19 1333           Exercise Goal Re-Evaluation   Exercise Goals Review  Increase Physical Activity;Increase Strength and Stamina;Understanding of Exercise Prescription  Increase Physical Activity;Increase Strength and Stamina;Able to understand and use rate of perceived exertion (RPE) scale;Knowledge and understanding of Target Heart Rate Range (THRR);Able to check pulse independently;Understanding of Exercise Prescription      Comments  Michie is doing well in rehab.  She has noticed that when she goes out to walk it just wipes her out.  We talked about it being due to the heat and humidity.  She will try to get up to go out earlier in the day to help.  She has noticed that her strength and stamina are improving overall. She can tell that rehab has really made a difference for her.  Charlynn missed a day due to back pain.  She has been back and did seated machines.      Expected Outcomes  Short: Try to get up earlier on off days for exercise.  Long: Continue to improve strength and stamina.  Short - continue to attend consistently Long - improve overall  MET levels         Discharge Exercise Prescription (Final Exercise Prescription Changes): Exercise Prescription Changes - 06/24/19 1300      Response to Exercise   Blood Pressure (Admit)  140/80    Blood Pressure (Exercise)  140/62    Blood Pressure (Exit)  130/80    Heart Rate (Admit)  72 bpm    Heart Rate (Exercise)  82 bpm    Heart Rate (Exit)  66 bpm    Rating of Perceived Exertion (Exercise)  12    Symptoms  none    Duration  Continue with 30 min of aerobic exercise without signs/symptoms of physical distress.    Intensity  THRR unchanged      Progression   Progression  Continue to progress workloads to maintain intensity without signs/symptoms of physical distress.    Average METs  3.6      Resistance Training   Training Prescription  Yes    Weight  4 lb  Reps  10-15      Treadmill   MPH  3.5    Grade  2    Minutes  15    METs  4.64      NuStep   Level  4    SPM  80    Minutes  15    METs  2.4      Recumbant Elliptical   Level  2    RPM  50    Minutes  15    METs  1.5      Home Exercise Plan   Plans to continue exercise at  Home (comment)   walking, weights   Frequency  Add 1 additional day to program exercise sessions.       Nutrition:  Target Goals: Understanding of nutrition guidelines, daily intake of sodium <1554m, cholesterol <2058m calories 30% from fat and 7% or less from saturated fats, daily to have 5 or more servings of fruits and vegetables.  Biometrics: Pre Biometrics - 05/18/19 1219      Pre Biometrics   Height  5' 6"  (1.676 m)    Weight  184 lb (83.5 kg)    BMI (Calculated)  29.71    Single Leg Stand  22.78 seconds        Nutrition Therapy Plan and Nutrition Goals: Nutrition Therapy & Goals - 04/20/19 1502      Nutrition Therapy   Diet  HH, low Na diet    Drug/Food Interactions  Statins/Certain Fruits   crestor   Protein (specify units)  65g    Fiber  25 grams    Whole Grain Foods  3 servings    Saturated Fats  12  max. grams    Fruits and Vegetables  5 servings/day    Sodium  1.5 grams      Personal Nutrition Goals   Nutrition Goal  ST: LT: maintain wt, increase energy further, manage healthy    Comments  Sometimes won't eat most of the morning. chicken salad tomatoes, zucchini. Will also eat yogurt, yoplait.  Walnuts before bed. When she does eat B she has waffles, toast [place], boiled egg and grits. 60oz water per day. End of March to April would eat 2 meals/day and now eats 1 big meal. Since heart event cut out snacks, soft drinks (used to have 2-3/day). 179lbs maintaing wt. discussed [employer], HH eating, Na, Fiber, Fat, added sugar and added salt.      Intervention Plan   Intervention  Prescribe, educate and counsel regarding individualized specific dietary modifications aiming towards targeted core components such as weight, hypertension, lipid management, diabetes, heart failure and other comorbidities.;Nutrition handout(s) given to patient.   Nutrition info given in email   Expected Outcomes  Short Term Goal: Understand basic principles of dietary content, such as calories, fat, sodium, cholesterol and nutrients.;Short Term Goal: A plan has been developed with personal nutrition goals set during dietitian appointment.;Long Term Goal: Adherence to prescribed nutrition plan.       Nutrition Assessments: Nutrition Assessments - 05/15/19 1213      MEDFICTS Scores   Pre Score  17       Nutrition Goals Re-Evaluation: Nutrition Goals Re-Evaluation    Row Name 05/29/19 0822 06/17/19 0827           Goals   Current Weight  184 lb 11.2 oz (83.8 kg)  188 lb 1.6 oz (85.3 kg)      Nutrition Goal  ST: continue to limit added salt  and sugar LT: maintain wt, increase energy further, manage healthy  ST: try more variety LT: maintain wt, increase energy further, manage healthy      Comment  Pt reports not wanting to make specific goals, but she is currently working on limiting her Na and added sugar  intake; she used to drink 2-3 sodas per day and is now at 1/week. Pt not counting her daily sodium, but is choosing low sodium options. Pt is working on label reading. Encouraged pt that she is making progress and doing well. Told pt that if she needed any resources or help to let me know.  Pt reports using her air fryer and limitng Na and added sugar in diet as well as incorporating lean proteins, fruits and vegetables. Pt reports slow progress, encouraged pt that it is a process and slow changes make for lasting changes. pt reports needing more variety but doesn't follow recipes, told her that she could utilize recipes as a source of ideas and gave her some resources for [facility] recipes as well as some healthy swaps. Pt reports still having only one meal/day. Pt asked about protein shakes, discussed how it is not a replacement for a meal but could be a good supplement to her diet, especially if she is only having one meal; discussed how it can be hard to meet needs with only one meal.      Expected Outcome  ST: continue to limit added salt and sugar LT: maintain wt, increase energy further, manage healthy  Increase variety and increase sustainability         Nutrition Goals Discharge (Final Nutrition Goals Re-Evaluation): Nutrition Goals Re-Evaluation - 06/17/19 0827      Goals   Current Weight  188 lb 1.6 oz (85.3 kg)    Nutrition Goal  ST: try more variety LT: maintain wt, increase energy further, manage healthy    Comment  Pt reports using her air fryer and limitng Na and added sugar in diet as well as incorporating lean proteins, fruits and vegetables. Pt reports slow progress, encouraged pt that it is a process and slow changes make for lasting changes. pt reports needing more variety but doesn't follow recipes, told her that she could utilize recipes as a source of ideas and gave her some resources for [facility] recipes as well as some healthy swaps. Pt reports still having only one meal/day. Pt asked about  protein shakes, discussed how it is not a replacement for a meal but could be a good supplement to her diet, especially if she is only having one meal; discussed how it can be hard to meet needs with only one meal.    Expected Outcome  Increase variety and increase sustainability       Psychosocial: Target Goals: Acknowledge presence or absence of significant depression and/or stress, maximize coping skills, provide positive support system. Participant is able to verbalize types and ability to use techniques and skills needed for reducing stress and depression.   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      Family Dynamics   Good Support System?  Yes   husband     Barriers   Psychosocial barriers to participate in program  There are  no identifiable barriers or psychosocial needs.;The patient should benefit from training in stress management and relaxation.      Screening Interventions   Interventions  Encouraged to exercise;Provide feedback about the scores to participant;To provide support and resources with identified psychosocial needs    Expected Outcomes  Short Term goal: Utilizing psychosocial counselor, staff and physician to assist with identification of specific Stressors or current issues interfering with healing process. Setting desired goal for each stressor or current issue identified.;Long Term Goal: Stressors or current issues are controlled or eliminated.;Short Term goal: Identification and review with participant of any Quality of Life or Depression concerns found by scoring the questionnaire.;Long Term goal: The participant improves quality of Life and PHQ9 Scores as seen by post scores and/or verbalization of changes       Quality of Life  Scores:  Quality of Life - 05/15/19 1212      Quality of Life   Select  Quality of Life      Quality of Life Scores   Health/Function Pre  26.6 %    Socioeconomic Pre  26.43 %    Psych/Spiritual Pre  29.14 %    Family Pre  23.4 %    GLOBAL Pre  26.61 %      Scores of 19 and below usually indicate a poorer quality of life in these areas.  A difference of  2-3 points is a clinically meaningful difference.  A difference of 2-3 points in the total score of the Quality of Life Index has been associated with significant improvement in overall quality of life, self-image, physical symptoms, and general health in studies assessing change in quality of life.  PHQ-9: Recent Review Flowsheet Data    Depression screen [facility] 2/9 05/18/2019 12/31/2018 10/03/2017 09/16/2017 07/27/2016   Decreased Interest 1 0 0 0 0   Down, Depressed, Hopeless 0 0 0 0 0   PHQ - 2 Score 1 0 0 0 0   Altered sleeping 0 - 3 - -   Tired, decreased energy 1 - 1 - -   Change in appetite 0 - 0 - -   Feeling bad or failure about yourself  0 - 0 - -   Trouble concentrating 0 - 0 - -   Moving slowly or fidgety/restless 0 - 0 - -   Suicidal thoughts 0 - 0 - -   PHQ-9 Score 2 - 4 - -   Difficult doing work/chores Not difficult at all - Not difficult at all - -     Interpretation of Total Score  Total Score Depression Severity:  1-4 = Minimal depression, 5-9 = Mild depression, 10-14 = Moderate depression, 15-19 = Moderately severe depression, 20-27 = Severe depression   Psychosocial Evaluation and Intervention:   Psychosocial Re-Evaluation: Psychosocial Re-Evaluation    Row Name 06/15/19 0825             Psychosocial Re-Evaluation   Current issues with  None Identified       Comments  Geneva is doing well mentally.  She is feeling good overall.  She does not have any current stress concerns or depression.  She is sleeping well most night unless she has caffiene too close to bed time.       Expected Outcomes  Short:  Continue to stay positive.  Long: Continue to practice self care.       Interventions  Encouraged to attend Cardiac Rehabilitation for the exercise       Continue Psychosocial  Services   Follow up required by counselor          Psychosocial Discharge (Final Psychosocial Re-Evaluation): Psychosocial Re-Evaluation - 06/15/19 0825      Psychosocial Re-Evaluation   Current issues with  None Identified    Comments  Carmita is doing well mentally.  She is feeling good overall.  She does not have any current stress concerns or depression.  She is sleeping well most night unless she has caffiene too close to bed time.    Expected Outcomes  Short: Continue to stay positive.  Long: Continue to practice self care.    Interventions  Encouraged to attend Cardiac Rehabilitation for the exercise    Continue Psychosocial Services   Follow up required by counselor       Vocational Rehabilitation: Provide vocational rehab assistance to qualifying candidates.   Vocational Rehab Evaluation & Intervention: Vocational Rehab - 05/08/19 1112      Initial Vocational Rehab Evaluation & Intervention   Assessment shows need for Vocational Rehabilitation  No       Education: Education Goals: Education classes will be provided on a variety of topics geared toward better understanding of heart health and risk factor modification. Participant will state understanding/return demonstration of topics presented as noted by education test scores.  Learning Barriers/Preferences:   Education Topics:  AED/CPR: - Group verbal and written instruction with the use of models to demonstrate the basic use of the AED with the basic ABC's of resuscitation.   General Nutrition Guidelines/Fats and Fiber: -Group instruction provided by verbal, written material, models and posters to present the general guidelines for heart healthy nutrition. Gives an explanation and review of dietary fats and fiber.   Controlling  Sodium/Reading Food Labels: -Group verbal and written material supporting the discussion of sodium use in heart healthy nutrition. Review and explanation with models, verbal and written materials for utilization of the food label.   Exercise Physiology & General Exercise Guidelines: - Group verbal and written instruction with models to review the exercise physiology of the cardiovascular system and associated critical values. Provides general exercise guidelines with specific guidelines to those with heart or lung disease.    Aerobic Exercise & Resistance Training: - Gives group verbal and written instruction on the various components of exercise. Focuses on aerobic and resistive training programs and the benefits of this training and how to safely progress through these programs..   Flexibility, Balance, Mind/Body Relaxation: Provides group verbal/written instruction on the benefits of flexibility and balance training, including mind/body exercise modes such as yoga, pilates and tai chi.  Demonstration and skill practice provided.   Stress and Anxiety: - Provides group verbal and written instruction about the health risks of elevated stress and causes of high stress.  Discuss the correlation between heart/lung disease and anxiety and treatment options. Review healthy ways to manage with stress and anxiety.   Depression: - Provides group verbal and written instruction on the correlation between heart/lung disease and depressed mood, treatment options, and the stigmas associated with seeking treatment.   Anatomy & Physiology of the Heart: - Group verbal and written instruction and models provide basic cardiac anatomy and physiology, with the coronary electrical and arterial systems. Review of Valvular disease and Heart Failure   Cardiac Procedures: - Group verbal and written instruction to review commonly prescribed medications for heart disease. Reviews the medication, class of the drug,  and side effects. Includes the steps to properly store meds and maintain the prescription regimen. (beta blockers  and nitrates)   Cardiac Medications I: - Group verbal and written instruction to review commonly prescribed medications for heart disease. Reviews the medication, class of the drug, and side effects. Includes the steps to properly store meds and maintain the prescription regimen.   Cardiac Medications II: -Group verbal and written instruction to review commonly prescribed medications for heart disease. Reviews the medication, class of the drug, and side effects. (all other drug classes)    Go Sex-Intimacy & Heart Disease, Get SMART - Goal Setting: - Group verbal and written instruction through game format to discuss heart disease and the return to sexual intimacy. Provides group verbal and written material to discuss and apply goal setting through the application of the S.M.A.R.T. Method.   Other Matters of the Heart: - Provides group verbal, written materials and models to describe Stable Angina and Peripheral Artery. Includes description of the disease process and treatment options available to the cardiac patient.   Exercise & Equipment Safety: - Individual verbal instruction and demonstration of equipment use and safety with use of the equipment.   Cardiac Rehab from 05/18/2019 in [facility] Cardiac and Pulmonary Rehab  Date  05/18/19  Educator  [facility]  Instruction Review Code  1- Verbalizes Understanding      Infection Prevention: - Provides verbal and written material to individual with discussion of infection control including proper hand washing and proper equipment cleaning during exercise session.   Cardiac Rehab from 05/18/2019 in [facility] Cardiac and Pulmonary Rehab  Date  05/18/19  Educator  [facility]  Instruction Review Code  1- Verbalizes Understanding      Falls Prevention: - Provides verbal and written material to individual with discussion of falls prevention and  safety.   Cardiac Rehab from 05/18/2019 in [facility] Cardiac and Pulmonary Rehab  Date  05/18/19  Educator  [facility]  Instruction Review Code  1- Verbalizes Understanding      Diabetes: - Individual verbal and written instruction to review signs/symptoms of diabetes, desired ranges of glucose level fasting, after meals and with exercise. Acknowledge that pre and post exercise glucose checks will be done for 3 sessions at entry of program.   Know Your Numbers and Risk Factors: -Group verbal and written instruction about important numbers in your health.  Discussion of what are risk factors and how they play a role in the disease process.  Review of Cholesterol, Blood Pressure, Diabetes, and BMI and the role they play in your overall health.   Sleep Hygiene: -Provides group verbal and written instruction about how sleep can affect your health.  Define sleep hygiene, discuss sleep cycles and impact of sleep habits. Review good sleep hygiene tips.    Other: -Provides group and verbal instruction on various topics (see comments)   Knowledge Questionnaire Score: Knowledge Questionnaire Score - 05/15/19 1213      Knowledge Questionnaire Score   Pre Score  17/26   Stress, PAD, Exercise,Nutrition and Tobacco questions missed correxct response       Core Components/Risk Factors/Patient Goals at Admission: Personal Goals and Risk Factors at Admission - 05/18/19 1220      Core Components/Risk Factors/Patient Goals on Admission    Weight Management  Yes;Weight Loss    Intervention  Weight Management: Develop a combined nutrition and exercise program designed to reach desired caloric intake, while maintaining appropriate intake of nutrient and fiber, sodium and fats, and appropriate energy expenditure required for the weight goal.;Weight Management: Provide education and appropriate resources to help participant work on and attain  dietary goals.    Admit Weight  184 lb (83.5 kg)    Goal Weight: Short  Term  178 lb (80.7 kg)    Goal Weight: Long Term  170 lb (77.1 kg)    Expected Outcomes  Short Term: Continue to assess and modify interventions until short term weight is achieved;Long Term: Adherence to nutrition and physical activity/exercise program aimed toward attainment of established weight goal;Weight Loss: Understanding of general recommendations for a balanced deficit meal plan, which promotes 1-2 lb weight loss per week and includes a negative energy balance of [phone] kcal/d;Understanding recommendations for meals to include 15-35% energy as protein, 25-35% energy from fat, 35-60% energy from carbohydrates, less than 219m of dietary cholesterol, 20-35 gm of total fiber daily;Understanding of distribution of calorie intake throughout the day with the consumption of 4-5 meals/snacks    Lipids  Yes    Intervention  Provide education and support for participant on nutrition & aerobic/resistive exercise along with prescribed medications to achieve LDL <737m HDL >4036m   Expected Outcomes  Short Term: Participant states understanding of desired cholesterol values and is compliant with medications prescribed. Participant is following exercise prescription and nutrition guidelines.;Long Term: Cholesterol controlled with medications as prescribed, with individualized exercise RX and with personalized nutrition plan. Value goals: LDL < 72m51mDL > 40 mg.       Core Components/Risk Factors/Patient Goals Review:  Goals and Risk Factor Review    Row Name 05/04/19 [id number] 0827           Core Components/Risk Factors/Patient Goals Review   Personal Goals Review  Weight Management/Obesity;Hypertension  Weight Management/Obesity;Hypertension      Review  Overall she is doing well.  Her weight has stayed steady and her blood pressures have been doing well.  She does have a follow up scheduled with the surgeon on Wednesday of this week for increased incisional soreness and right sided tightness  in her chest.  She is 4 month post surgery so within the normal window for pain, but she does have a chest xray scheduled for that day to make sure nothing else is going on.  She will keep us pKoreated.  MarlKamyiadoing well in rehab. She is not losing weight like she wants.  So we talked abour walking longer at home and adding in intervals in rehab.  Her blood pressures have been good and she checks them daily.  Her next follow up is with her primary tomorrow.      Expected Outcomes  Short: Talk to doctor about chest/incisional pain.  Long: Continue to monitor risk factors  Short: Talk to doctor tomorrow.  Long: Continue to work on weight loss.         Core Components/Risk Factors/Patient Goals at Discharge (Final Review):  Goals and Risk Factor Review - 06/15/19 0827      Core Components/Risk Factors/Patient Goals Review   Personal Goals Review  Weight Management/Obesity;Hypertension    Review  MarlSaydidoing well in rehab. She is not losing weight like she wants.  So we talked abour walking longer at home and adding in intervals in rehab.  Her blood pressures have been good and she checks them daily.  Her next follow up is with her primary tomorrow.    Expected Outcomes  Short: Talk to doctor tomorrow.  Long: Continue to work on weight loss.       ITP Comments: ITP Comments    Row Name 04/14/19 1656 04/15/19 1519 05/04/19  7948 05/08/19 1119 05/18/19 1215   ITP Comments  Initial Cardiac Home Based Care visit completed Intake and Consent completed Appts for followup done with EP and RD  Completed initial ExRx created and sent to Dr. Emily Filbert, Medical Director to review and sign.  Completed virtual visit. Scheduled to orient on 7/17 and 7/21.  Orientation via phone completed today   Gym orientation with  EP/RD Eval is scheduled for 7/27 at 11 AM.  Vivia is having surgery to remove the sternal wires on Mon 7/20.  Completed 6MWT and gym orientation today.  Documentation for diagnosis can be  found in [facility] encounter 01/05/19.  Initial ITP created and sent for review to Dr. Emily Filbert, Medical Director.   [place] Name 06/03/19 [phone] 07/01/19 0616         ITP Comments  30 Day Review Completed today. Continue with ITP unless changed by Medical Director review.  New to program  30 Day review. Continue with ITP unless directed changes per Medical Director review.         Comments:

## 2019-07-02 ENCOUNTER — Ambulatory Visit: Payer: Medicare Other

## 2019-07-03 ENCOUNTER — Other Ambulatory Visit: Payer: Self-pay

## 2019-07-03 ENCOUNTER — Encounter: Payer: Medicare Other | Admitting: *Deleted

## 2019-07-03 DIAGNOSIS — Z951 Presence of aortocoronary bypass graft: Secondary | ICD-10-CM | POA: Diagnosis not present

## 2019-07-03 NOTE — Progress Notes (Signed)
Daily Session Note  Patient Details  Name: Jessica [name] MRN: [id number] Date of Birth: 1950-12-28 Referring Provider:     Cardiac Rehab from 05/18/2019 in [facility] Cardiac and Pulmonary Rehab  Referring Provider  Quay Burow MD      Encounter Date: 07/03/2019  Check In: Session Check In - 07/03/19 0805      Check-In   Supervising physician immediately available to respond to emergencies  See telemetry face sheet for immediately available ER MD    Location  [facility]-Cardiac & Pulmonary Rehab    Staff Present  Heath Lark, RN, BSN, CCRP;Amanda Sommer, BA, ACSM CEP, Exercise Physiologist;Jessica Paris, MA, RCEP, CCRP, CCET    Virtual Visit  No    Medication changes reported      No    Fall or balance concerns reported     No    Warm-up and Cool-down  Performed on first and last piece of equipment    Resistance Training Performed  Yes    VAD Patient?  No    PAD/SET Patient?  No      Pain Assessment   Currently in Pain?  No/denies          Social History   Tobacco Use  Smoking Status Never Smoker  Smokeless Tobacco Never Used    Goals Met:  Independence with exercise equipment Exercise tolerated well No report of cardiac concerns or symptoms  Goals Unmet:  Not Applicable  Comments: Pt able to follow exercise prescription today without complaint.  Will continue to monitor for progression.    Dr. Emily Filbert is Medical Director for [place] and [facility].

## 2019-07-06 ENCOUNTER — Other Ambulatory Visit: Payer: Self-pay

## 2019-07-06 ENCOUNTER — Encounter: Payer: Medicare Other | Admitting: *Deleted

## 2019-07-06 DIAGNOSIS — Z951 Presence of aortocoronary bypass graft: Secondary | ICD-10-CM | POA: Diagnosis not present

## 2019-07-06 NOTE — Progress Notes (Signed)
Daily Session Note  Patient Details  Name: Jessica [name] MRN: [id number] Date of Birth: [birth year] Referring Provider:     Cardiac Rehab from 05/18/2019 in [facility] Cardiac and Pulmonary Rehab  Referring Provider  Quay Burow MD      Encounter Date: 07/06/2019  Check In: Session Check In - 07/06/19 0821      Check-In   Supervising physician immediately available to respond to emergencies  See telemetry face sheet for immediately available ER MD    Location  [facility]-Cardiac & Pulmonary Rehab    Staff Present  Heath Lark, RN, BSN, Laveda Norman, BS, ACSM CEP, Exercise Physiologist;Joseph Tessie Fass RCP,RRT,BSRT    Virtual Visit  No    Medication changes reported      No    Fall or balance concerns reported     No    Warm-up and Cool-down  Performed on first and last piece of equipment    Resistance Training Performed  Yes    VAD Patient?  No    PAD/SET Patient?  No      Pain Assessment   Currently in Pain?  No/denies          Social History   Tobacco Use  Smoking Status Never Smoker  Smokeless Tobacco Never Used    Goals Met:  Independence with exercise equipment Exercise tolerated well No report of cardiac concerns or symptoms Strength training completed today  Goals Unmet:  Not Applicable  Comments: Pt able to follow exercise prescription today without complaint.  Will continue to monitor for progression.    Dr. Emily Filbert is Medical Director for [place] and [facility].

## 2019-07-08 ENCOUNTER — Telehealth (INDEPENDENT_AMBULATORY_CARE_PROVIDER_SITE_OTHER): Payer: Medicare Other | Admitting: Physician Assistant

## 2019-07-08 ENCOUNTER — Telehealth: Payer: Self-pay

## 2019-07-08 ENCOUNTER — Encounter: Payer: Medicare Other | Admitting: *Deleted

## 2019-07-08 ENCOUNTER — Other Ambulatory Visit: Payer: Self-pay

## 2019-07-08 ENCOUNTER — Encounter: Payer: Self-pay | Admitting: Physician Assistant

## 2019-07-08 VITALS — BP 110/68 | Ht 66.5 in | Wt 186.0 lb

## 2019-07-08 DIAGNOSIS — R7303 Prediabetes: Secondary | ICD-10-CM

## 2019-07-08 DIAGNOSIS — Z951 Presence of aortocoronary bypass graft: Secondary | ICD-10-CM | POA: Diagnosis not present

## 2019-07-08 DIAGNOSIS — E785 Hyperlipidemia, unspecified: Secondary | ICD-10-CM

## 2019-07-08 DIAGNOSIS — I25810 Atherosclerosis of coronary artery bypass graft(s) without angina pectoris: Secondary | ICD-10-CM | POA: Diagnosis not present

## 2019-07-08 NOTE — Patient Instructions (Addendum)
Medication Instructions:   Increase Metoprolol Tartrate (Lopressor) 12.5 mg 2 times a day  Increase Crestor to 20 mg daily  If you need a refill on your cardiac medications before your next appointment, please call your pharmacy.   Lab work: You will need to have labs (blood work) drawn in December 2020:  Fasting Lipid Panel-DO NOT EAT OR DRINK PAST MIDNIGHT  Liver Function Test  If you have labs (blood work) drawn today and your tests are completely normal, you will receive your results only by: [name] MyChart Message (if you have MyChart) OR . A paper copy in the mail If you have any lab test that is abnormal or we need to change your treatment, we will call you to review the results.  Testing/Procedures: NONE ordered at this time of appointment   Follow-Up: At [facility], you and your health needs are our priority.  As part of our continuing mission to provide you with exceptional heart care, we have created designated Provider Care Teams.  These Care Teams include your primary Cardiologist (physician) and Advanced Practice Providers (APPs -  Physician Assistants and Nurse Practitioners) who all work together to provide you with the care you need, when you need it. . Your physician recommends that you keep your scheduled appointment in December with Jessica Burow, MD  Any Other Special Instructions Will Be Listed Below (If Applicable).

## 2019-07-08 NOTE — Progress Notes (Signed)
Daily Session Note  Patient Details  Name: Jessica [name] MRN: [id number] Date of Birth: [birth year] Referring Provider:     Cardiac Rehab from 05/18/2019 in [facility] Cardiac and Pulmonary Rehab  Referring Provider  Quay Burow MD      Encounter Date: 07/08/2019  Check In: Session Check In - 07/08/19 0802      Check-In   Supervising physician immediately available to respond to emergencies  See telemetry face sheet for immediately available ER MD    Location  [facility]-Cardiac & Pulmonary Rehab    Staff Present  Alberteen Sam, MA, RCEP, CCRP, Kathyrn Drown, RN BSN;Joseph [facility] [employer]    Virtual Visit  No    Medication changes reported      No    Fall or balance concerns reported     No    Warm-up and Cool-down  Performed on first and last piece of equipment    Resistance Training Performed  Yes    VAD Patient?  No    PAD/SET Patient?  No      Pain Assessment   Currently in Pain?  No/denies          Social History   Tobacco Use  Smoking Status Never Smoker  Smokeless Tobacco Never Used    Goals Met:  Independence with exercise equipment Exercise tolerated well No report of cardiac concerns or symptoms Strength training completed today  Goals Unmet:  Not Applicable  Comments: Pt able to follow exercise prescription today without complaint.  Will continue to monitor for progression.    Dr. Emily Filbert is Medical Director for [place] and [facility].

## 2019-07-08 NOTE — Telephone Encounter (Signed)
Called patient went over her AVS. Patient verbalized an understanding all (if any) questions were answered. Patient informed AVS will be put in the mail. 

## 2019-07-08 NOTE — Progress Notes (Signed)
Virtual Visit via Telephone Note   This visit type was conducted due to national recommendations for restrictions regarding the COVID-19 Pandemic (e.g. social distancing) in an effort to limit this patient's exposure and mitigate transmission in our community.  Due to her co-morbid illnesses, this patient is at least at moderate risk for complications without adequate follow up.  This format is felt to be most appropriate for this patient at this time.  The patient did not have access to video technology/had technical difficulties with video requiring transitioning to audio format only (telephone).  All issues noted in this document were discussed and addressed.  No physical exam could be performed with this format.  Please refer to the patient's chart for her  consent to telehealth for [facility].   Date:  07/08/2019   ID:  Jessica [name], DOB July 12, 1951, MRN [id number]  Patient Location: Home Provider Location: Home  PCP:  Hoyt Koch, MD  Cardiologist:  Quay Burow, MD  Electrophysiologist:  None   Evaluation Performed:  Follow-Up Visit  Chief Complaint:  followup  History of Present Illness:    Jessica [name] is a 68 y.o. female with PMH of hyperlipidemia, prediabetes and history of right breast cancer s/p radiation and right lumpectomy who presented to the hospital on 12/31/2018 with exertional chest pain.  While in the ED, her heart rate frequently dipped down to the 40s, she has a longstanding history of asymptomatic sinus bradycardia.  Her symptom was concerning for unstable angina.  Echocardiogram obtained on 3/11 showed EF 60 to 65%, mild asymmetric LVH, mild tricuspid regurgitation, otherwise no significant valvular issue.  She was eventually taken to the Cath Lab on 01/01/2019 which revealed 75% mid LAD lesion, 40% proximal LAD lesion, 95% ostial D1, 95% proximal left circumflex, 80% distal RCA lesion, EF 55 to 65%.  Cardiothoracic surgery was consulted for bypass  surgery.  She ultimately underwent CABG x5 with LIMA to LAD, SVG to diagonal, sequential SVG to OM1/OM 2, SVG to RCA by Dr. Cyndia Bent on 01/05/2019.  Postoperation, high-dose aspirin, beta-blocker and statin was added to her medical regimen.  She did have mild volume overload after surgery but responded well to diuretic.  Short course of diuretic was given after discharge.   She underwent removal of all sternal wires on 05/11/2019 due to persistent chest wall pain.patient was contacted today via telephone visit.  She has been having some fatigue and shortness of breath with exertion daily however no chest pain.  Interestingly, she only notices when she climbs stairs at home, however does not notice the symptom during physical exertion.  Her fatigue is in the lower extremity and occur bilaterally.  Recent lab work shows stable renal function, electrolyte, CBC, and vitamin B12.  Her LDL was quite elevated at 145.  She says she was only taking the Crestor 3 times a week.  I recommended she increase the Crestor to 20 mg daily and recheck a fasting lipid panel and LFT in 2 months.  If LDL still not at goal, I plan to refer the patient to the lipid clinic.  She has also failed Lipitor in the past as well.  She may be a candidate for PCSK9 inhibitor.  Otherwise she denies any lower extremity edema, orthopnea or PND.  The patient does not have symptoms concerning for COVID-19 infection (fever, chills, cough, or new shortness of breath).    Past Medical History:  Diagnosis Date   Allergy    Breast cancer ([place])  2000   Cancer [facility])    breast   Colon polyp    Coronary artery disease    GERD (gastroesophageal reflux disease)    History of blood transfusion    Hyperlipidemia    Migraine    history of migrarines, none as an adult   Personal history of radiation therapy 2000   Past Surgical History:  Procedure Laterality Date   ABDOMINAL HYSTERECTOMY     BREAST LUMPECTOMY Right 2000   BREAST  SURGERY Right    calcifaction removed   BUNIONECTOMY Bilateral    COLONOSCOPY W/ POLYPECTOMY     CORONARY ARTERY BYPASS GRAFT N/A 01/05/2019   Procedure: CORONARY ARTERY BYPASS GRAFTING (CABG), using right leg saphenous endoscopic and open vein harvest, exploration left leg;  Surgeon: Gaye Pollack, MD;  Location: [place];  Service: Open Heart Surgery;  Laterality: N/A;   EYE SURGERY Bilateral    cataract   hemorrhoid     LEFT HEART CATH AND CORONARY ANGIOGRAPHY N/A 01/01/2019   Procedure: LEFT HEART CATH AND CORONARY ANGIOGRAPHY;  Surgeon: Jettie Booze, MD;  Location: [place] [facility];  Service: Cardiovascular;  Laterality: N/A;   STERNAL WIRES REMOVAL N/A 05/11/2019   Procedure: STERNAL WIRES REMOVAL;  Surgeon: Gaye Pollack, MD;  Location: [facility];  Service: Thoracic;  Laterality: N/A;   TEE WITHOUT CARDIOVERSION N/A 01/05/2019   Procedure: TRANSESOPHAGEAL ECHOCARDIOGRAM (TEE);  Surgeon: Gaye Pollack, MD;  Location: [place];  Service: Open Heart Surgery;  Laterality: N/A;     Current Meds  Medication Sig   Ascorbic Acid (VITAMIN C) 1000 MG tablet Take 2,000 mg by mouth daily.   aspirin EC 325 MG EC tablet Take 1 tablet (325 mg total) by mouth daily. (Patient taking differently: Take 325 mg by mouth at bedtime. )   Cholecalciferol (VITAMIN D-3) 125 MCG (5000 UT) TABS Take 5,000 Units by mouth daily.   Digestive Enzymes (DIGESTIVE ENZYME PO) Take 1 capsule by mouth 2 (two) times daily.    loratadine (CLARITIN) 10 MG tablet Take 10 mg by mouth every evening.    metoprolol tartrate (LOPRESSOR) 25 MG tablet Take 0.5 tablets (12.5 mg total) by mouth 2 (two) times daily. (Patient taking differently: Take 12.5 mg by mouth daily. )   Multiple Vitamin (MULTIVITAMIN WITH MINERALS) TABS tablet Take 1 tablet by mouth daily.   rosuvastatin (CRESTOR) 20 MG tablet Start with 1 tablet three times weekly and increase to 1 tablet daily as tolerated     Allergies:   Codeine, Sulfa  antibiotics, and Sulfacetamide   Social History   Tobacco Use   Smoking status: Never Smoker   Smokeless tobacco: Never Used  Substance Use Topics   Alcohol use: No    Frequency: Never   Drug use: No     Family Hx: The patient's family history includes Asthma in her paternal aunt; Diabetes in her paternal aunt; Heart disease in her brother; Kidney disease in her paternal uncle; Stroke in her paternal uncle.  ROS:   Please see the history of present illness.     All other systems reviewed and are negative.   Prior CV studies:   The following studies were reviewed today:  Echo 12/31/2018 IMPRESSIONS    1. The left ventricle has normal systolic function with an ejection fraction of 60-65%. The cavity size was normal. There is mild asymmetric left ventricular hypertrophy. Left ventricular diastolic parameters were normal. No evidence of left ventricular  regional wall motion abnormalities.  2. The right ventricle has normal systolic function. The cavity was normal. There is no increase in right ventricular wall thickness.  3. The aortic valve is tricuspid Aortic valve regurgitation is mild by color flow Doppler.  4. The inferior vena cava was normal in size with <50% respiratory variability.   Cath 01/01/2019  Mid LAD lesion is 75% stenosed.  Prox LAD lesion is 40% stenosed.  Ost 1st Diag lesion is 95% stenosed.  Prox Cx lesion is 95% stenosed.  Dist RCA lesion is 80% stenosed.  The left ventricular systolic function is normal.  LV end diastolic pressure is normal.  The left ventricular ejection fraction is 55-65% by visual estimate.  There is no aortic valve stenosis.   Multivessel CAD including severe disease at the mid LAD, proximal diagonal, proximal circumflex and RCA at the bifurcation of the PDA and PLA.  Plan for surgery consult.     Labs/Other Tests and Data Reviewed:    EKG:  An ECG dated 01/06/2019 was personally reviewed today and  demonstrated:  Normal sinus rhythm with poor R wave progression in the anterior leads.  Recent Labs: 06/16/2019: ALT 20; BUN 14; Creatinine, Ser 0.98; Hemoglobin 12.7; Magnesium 1.5; Platelets 280.0; Potassium 3.9; Pro B Natriuretic peptide (BNP) 27.0; Sodium 140; TSH 1.94   Recent Lipid Panel Lab Results  Component Value Date/Time   CHOL 200 06/16/2019 11:06 AM   CHOL 113 03/12/2019 11:39 AM   TRIG 109.0 06/16/2019 11:06 AM   HDL 33.20 (L) 06/16/2019 11:06 AM   HDL 33 (L) 03/12/2019 11:39 AM   CHOLHDL 6 06/16/2019 11:06 AM   LDLCALC 145 (H) 06/16/2019 11:06 AM   LDLCALC 63 03/12/2019 11:39 AM    Wt Readings from Last 3 Encounters:  07/08/19 186 lb (84.4 kg)  06/16/19 186 lb (84.4 kg)  05/27/19 181 lb (82.1 kg)     Objective:    Vital Signs:  BP 110/68    Ht 5' 6.5" (1.689 m)    Wt 186 lb (84.4 kg)    BMI 29.57 kg/m    VITAL SIGNS:  reviewed  ASSESSMENT & PLAN:    1. CAD status post CABG: Continue aspirin.  Denies any recent chest pain.  Previous musculoskeletal pain has significantly improved after removal of sternal wire.  Instead of taking metoprolol 12.5 mg twice daily, she has been taking daily, I urged her to increase this to twice a day instead.  2. Hyperlipidemia: Recent lab work showed uncontrolled LDL, will increase her Crestor to daily instead of 3 times weekly dosing.  Obtain repeat fasting lipid panel in 2 months, if LDL remains uncontrolled we will refer her to lipid clinic.  Previous intolerance to the Lipitor  3. Prediabetes: Managed by primary care provider  COVID-19 Education: The signs and symptoms of COVID-19 were discussed with the patient and how to seek care for testing (follow up with PCP or arrange E-visit).  The importance of social distancing was discussed today.  Time:   Today, I have spent 17 minutes with the patient with telehealth technology discussing the above problems.     Medication Adjustments/Labs and Tests Ordered: Current medicines  are reviewed at length with the patient today.  Concerns regarding medicines are outlined above.   Tests Ordered: Orders Placed This Encounter  Procedures   Lipid panel   Hepatic function panel    Medication Changes: No orders of the defined types were placed in this encounter.   Follow Up:  In Person in 3  month(s)  Signed, Almyra Deforest, [place]  07/08/2019 11:24 PM    [facility]

## 2019-07-10 ENCOUNTER — Other Ambulatory Visit: Payer: Self-pay

## 2019-07-10 DIAGNOSIS — Z951 Presence of aortocoronary bypass graft: Secondary | ICD-10-CM

## 2019-07-10 NOTE — Progress Notes (Signed)
Daily Session Note  Patient Details  Name: KARENE BRACKEN MRN: [id number] Date of Birth: 1950-11-23 Referring Provider:     Cardiac Rehab from 05/18/2019 in [facility] Cardiac and Pulmonary Rehab  Referring Provider  Quay Burow MD      Encounter Date: 07/10/2019  Check In: Session Check In - 07/10/19 0801      Check-In   Supervising physician immediately available to respond to emergencies  See telemetry face sheet for immediately available ER MD    Location  [facility]-Cardiac & Pulmonary Rehab    Staff Present  Vida Rigger RN, BSN;Jessica Luan Pulling, MA, RCEP, CCRP, CCET;Joseph Hood RCP,RRT,BSRT    Virtual Visit  No    Medication changes reported      No    Fall or balance concerns reported     No    Warm-up and Cool-down  Performed on first and last piece of equipment    Resistance Training Performed  Yes    VAD Patient?  No    PAD/SET Patient?  No      Pain Assessment   Currently in Pain?  No/denies    Multiple Pain Sites  No          Social History   Tobacco Use  Smoking Status Never Smoker  Smokeless Tobacco Never Used    Goals Met:  Independence with exercise equipment Exercise tolerated well No report of cardiac concerns or symptoms Strength training completed today  Goals Unmet:  Not Applicable  Comments: Pt able to follow exercise prescription today without complaint.  Will continue to monitor for progression.   Dr. Emily Filbert is Medical Director for [place] and [facility].

## 2019-07-13 ENCOUNTER — Other Ambulatory Visit: Payer: Self-pay

## 2019-07-13 ENCOUNTER — Encounter: Payer: Medicare Other | Admitting: *Deleted

## 2019-07-13 DIAGNOSIS — Z951 Presence of aortocoronary bypass graft: Secondary | ICD-10-CM

## 2019-07-13 NOTE — Progress Notes (Signed)
Daily Session Note  Patient Details  Name: MALARY AYLESWORTH MRN: [id number] Date of Birth: 11/28/1950 Referring Provider:     Cardiac Rehab from 05/18/2019 in [facility] Cardiac and Pulmonary Rehab  Referring Provider  Quay Burow MD      Encounter Date: 07/13/2019  Check In: Session Check In - 07/13/19 0805      Check-In   Supervising physician immediately available to respond to emergencies  See telemetry face sheet for immediately available ER MD    Location  [facility]-Cardiac & Pulmonary Rehab    Staff Present  Heath Lark, RN, BSN, CCRP;Joseph Hood RCP,RRT,BSRT;Kelly Sligo, [place], ACSM CEP, Exercise Physiologist    Virtual Visit  No    Medication changes reported      No    Fall or balance concerns reported     No    Warm-up and Cool-down  Performed on first and last piece of equipment    Resistance Training Performed  Yes    VAD Patient?  No    PAD/SET Patient?  No      Pain Assessment   Currently in Pain?  No/denies          Social History   Tobacco Use  Smoking Status Never Smoker  Smokeless Tobacco Never Used    Goals Met:  Independence with exercise equipment Exercise tolerated well No report of cardiac concerns or symptoms  Goals Unmet:  Not Applicable  Comments: Pt able to follow exercise prescription today without complaint.  Will continue to monitor for progression.  NoTreadmill today and for a week. Jakiera is experiencing increased foot pain and thinks may be from walking on the treadmill. She has a history of foot trouble. She wants to try a week without using the treadmill and reevaluate after the week is over.   Dr. Emily Filbert is Medical Director for [place] and [facility].

## 2019-07-15 ENCOUNTER — Encounter: Payer: Medicare Other | Admitting: *Deleted

## 2019-07-15 ENCOUNTER — Other Ambulatory Visit: Payer: Self-pay

## 2019-07-15 DIAGNOSIS — Z951 Presence of aortocoronary bypass graft: Secondary | ICD-10-CM | POA: Diagnosis not present

## 2019-07-15 NOTE — Progress Notes (Signed)
Daily Session Note  Patient Details  Name: Jessica [name] MRN: [id number] Date of Birth: [birth year] Referring Provider:     Cardiac Rehab from 05/18/2019 in [facility] Cardiac and Pulmonary Rehab  Referring Provider  Quay Burow MD      Encounter Date: 07/15/2019  Check In: Session Check In - 07/15/19 0816      Check-In   Supervising physician immediately available to respond to emergencies  See telemetry face sheet for immediately available ER MD    Location  [facility]-Cardiac & Pulmonary Rehab    Staff Present  Heath Lark, RN, BSN, CCRP;Jessica Kearny, MA, RCEP, CCRP, CCET;Joseph Brian Head RCP,RRT,BSRT    Virtual Visit  No    Medication changes reported      No    Fall or balance concerns reported     No    Warm-up and Cool-down  Performed on first and last piece of equipment    Resistance Training Performed  Yes    VAD Patient?  No    PAD/SET Patient?  No      Pain Assessment   Currently in Pain?  No/denies          Social History   Tobacco Use  Smoking Status Never Smoker  Smokeless Tobacco Never Used    Goals Met:  Independence with exercise equipment Exercise tolerated well No report of cardiac concerns or symptoms  Goals Unmet:  Not Applicable  Comments: Pt able to follow exercise prescription today without complaint.  Will continue to monitor for progression.    Dr. Emily Filbert is Medical Director for [place] and [facility].

## 2019-07-17 ENCOUNTER — Other Ambulatory Visit: Payer: Self-pay

## 2019-07-17 DIAGNOSIS — Z951 Presence of aortocoronary bypass graft: Secondary | ICD-10-CM | POA: Diagnosis not present

## 2019-07-17 NOTE — Progress Notes (Signed)
Daily Session Note  Patient Details  Name: ZAMARAH ULLMER MRN: [id number] Date of Birth: 02-01-1951 Referring Provider:     Cardiac Rehab from 05/18/2019 in [facility] Cardiac and Pulmonary Rehab  Referring Provider  Quay Burow MD      Encounter Date: 07/17/2019  Check In: Session Check In - 07/17/19 0755      Check-In   Supervising physician immediately available to respond to emergencies  See telemetry face sheet for immediately available ER MD    Location  [facility]-Cardiac & Pulmonary Rehab    Staff Present  Vida Rigger RN, BSN;Jessica Luan Pulling, MA, RCEP, CCRP, CCET    Virtual Visit  No    Medication changes reported      No    Fall or balance concerns reported     No    Warm-up and Cool-down  Performed on first and last piece of equipment    Resistance Training Performed  Yes    VAD Patient?  No    PAD/SET Patient?  No      Pain Assessment   Currently in Pain?  No/denies    Multiple Pain Sites  No          Social History   Tobacco Use  Smoking Status Never Smoker  Smokeless Tobacco Never Used    Goals Met:  Independence with exercise equipment Exercise tolerated well No report of cardiac concerns or symptoms Strength training completed today  Goals Unmet:  Not Applicable  Comments: Pt able to follow exercise prescription today without complaint.  Will continue to monitor for progression.   Dr. Emily Filbert is Medical Director for [place] and [facility].

## 2019-07-20 ENCOUNTER — Other Ambulatory Visit: Payer: Self-pay

## 2019-07-20 ENCOUNTER — Encounter: Payer: Medicare Other | Admitting: *Deleted

## 2019-07-20 DIAGNOSIS — Z951 Presence of aortocoronary bypass graft: Secondary | ICD-10-CM | POA: Diagnosis not present

## 2019-07-20 NOTE — Progress Notes (Signed)
Daily Session Note  Patient Details  Name: Jessica [name] MRN: [id number] Date of Birth: April 15, 1951 Referring Provider:     Cardiac Rehab from 05/18/2019 in [facility] Cardiac and Pulmonary Rehab  Referring Provider  Quay Burow MD      Encounter Date: 07/20/2019  Check In: Session Check In - 07/20/19 0754      Check-In   Supervising physician immediately available to respond to emergencies  See telemetry face sheet for immediately available ER MD    Location  [facility]-Cardiac & Pulmonary Rehab    Staff Present  Gerlene Burdock, RN, BSN-BC, CCRP;Caulin Begley Amedeo Plenty, BS, ACSM CEP, Exercise Physiologist;Joseph Tessie Fass RCP,RRT,BSRT    Virtual Visit  No    Medication changes reported      No    Fall or balance concerns reported     No    Warm-up and Cool-down  Performed on first and last piece of equipment    Resistance Training Performed  Yes    VAD Patient?  No    PAD/SET Patient?  No      Pain Assessment   Currently in Pain?  No/denies    Multiple Pain Sites  No          Social History   Tobacco Use  Smoking Status Never Smoker  Smokeless Tobacco Never Used    Goals Met:  Independence with exercise equipment Exercise tolerated well No report of cardiac concerns or symptoms Strength training completed today  Goals Unmet:  Not Applicable  Comments: Pt able to follow exercise prescription today without complaint.  Will continue to monitor for progression.    Dr. Emily Filbert is Medical Director for [place] and [facility].

## 2019-07-24 ENCOUNTER — Encounter: Payer: Medicare Other | Attending: Cardiovascular Disease

## 2019-07-24 DIAGNOSIS — Z951 Presence of aortocoronary bypass graft: Secondary | ICD-10-CM | POA: Insufficient documentation

## 2019-07-29 ENCOUNTER — Encounter: Payer: Self-pay | Admitting: *Deleted

## 2019-07-29 DIAGNOSIS — Z951 Presence of aortocoronary bypass graft: Secondary | ICD-10-CM

## 2019-07-29 NOTE — Progress Notes (Signed)
Cardiac Individual Treatment Plan  Patient Details  Name: Jessica [name] MRN: [id number] Date of Birth: [birth year] Referring Provider:     Cardiac Rehab from 05/18/2019 in [facility] Cardiac and Pulmonary Rehab  Referring Provider  Quay Burow MD      Initial Encounter Date:    Cardiac Rehab from 05/18/2019 in [facility] Cardiac and Pulmonary Rehab  Date  05/18/19      Visit Diagnosis: S/P CABG x 5  Patient's Home Medications on Admission:  Current Outpatient Medications:  .  Ascorbic Acid (VITAMIN C) 1000 MG tablet, Take 2,000 mg by mouth daily., Disp: , Rfl:  .  aspirin EC 325 MG EC tablet, Take 1 tablet (325 mg total) by mouth daily. (Patient taking differently: Take 325 mg by mouth at bedtime. ), Disp: , Rfl: 0 .  Cholecalciferol (VITAMIN D-3) 125 MCG (5000 UT) TABS, Take 5,000 Units by mouth daily., Disp: , Rfl:  .  Digestive Enzymes (DIGESTIVE ENZYME PO), Take 1 capsule by mouth 2 (two) times daily. , Disp: , Rfl:  .  loratadine (CLARITIN) 10 MG tablet, Take 10 mg by mouth every evening. , Disp: , Rfl:  .  metoprolol tartrate (LOPRESSOR) 25 MG tablet, Take 0.5 tablets (12.5 mg total) by mouth 2 (two) times daily. (Patient taking differently: Take 12.5 mg by mouth daily. ), Disp: 30 tablet, Rfl: 11 .  Multiple Vitamin (MULTIVITAMIN WITH MINERALS) TABS tablet, Take 1 tablet by mouth daily., Disp: , Rfl:  .  rosuvastatin (CRESTOR) 20 MG tablet, Start with 1 tablet three times weekly and increase to 1 tablet daily as tolerated, Disp: 30 tablet, Rfl: 5  Past Medical History: Past Medical History:  Diagnosis Date  . Allergy   . Breast cancer ([place]) 2000  . Cancer [facility])    breast  . Colon polyp   . Coronary artery disease   . GERD (gastroesophageal reflux disease)   . History of blood transfusion   . Hyperlipidemia   . Migraine    history of migrarines, none as an adult  . Personal history of radiation therapy 2000    Tobacco Use: Social History   Tobacco Use  Smoking Status  Never Smoker  Smokeless Tobacco Never Used    Labs: Recent Review Flowsheet Data    Labs for ITP Cardiac and Pulmonary Rehab Latest Ref Rng & Units 01/05/2019 01/05/2019 01/05/2019 03/12/2019 06/16/2019   Cholestrol 0 - 200 mg/dL - - - 113 200   LDLCALC 0 - 99 mg/dL - - - 63 145(H)   HDL >39.00 mg/dL - - - 33(L) 33.20(L)   Trlycerides 0.0 - 149.0 mg/dL - - - 83 109.0   Hemoglobin A1c 4.6 - 6.5 % - - - - 6.4   PHART 7.350 - 7.450 7.380 7.315(L) 7.265(L) - -   PCO2ART 32.0 - 48.0 mmHg 38.0 47.1 49.9(H) - -   HCO3 20.0 - 28.0 mmol/L 22.9 23.9 22.6 - -   TCO2 22 - 32 mmol/L 24 25 24  - -   ACIDBASEDEF 0.0 - 2.0 mmol/L 3.0(H) 2.0 4.0(H) - -   O2SAT % 99.0 97.0 98.0 - -       Exercise Target Goals: Exercise Program Goal: Individual exercise prescription set using results from initial 6 min walk test and THRR while considering  patient's activity barriers and safety.   Exercise Prescription Goal: Initial exercise prescription builds to 30-45 minutes a day of aerobic activity, 2-3 days per week.  Home exercise guidelines will be given to patient during  program as part of exercise prescription that the participant will acknowledge.  Activity Barriers & Risk Stratification: Activity Barriers & Cardiac Risk Stratification - 05/18/19 1217      Activity Barriers & Cardiac Risk Stratification   Activity Barriers  Deconditioning;Muscular Weakness;Incisional Pain   sternal wire removed on 05/11/19   Cardiac Risk Stratification  High       6 Minute Walk: 6 Minute Walk    Row Name 05/18/19 1216         6 Minute Walk   Phase  Initial     Distance  1770 feet     Walk Time  6 minutes     # of Rest Breaks  0     MPH  3.35     METS  3.86     RPE  7     VO2 Peak  13.49     Symptoms  No     Resting HR  73 bpm     Resting BP  136/70     Resting Oxygen Saturation   99 %     Exercise Oxygen Saturation  during 6 min walk  99 %     Max Ex. HR  107 bpm     Max Ex. BP  144/76     2 Minute Post  BP  136/70        Oxygen Initial Assessment:   Oxygen Re-Evaluation:   Oxygen Discharge (Final Oxygen Re-Evaluation):   Initial Exercise Prescription: Initial Exercise Prescription - 05/18/19 1200      Date of Initial Exercise RX and Referring Provider   Date  05/18/19    Referring Provider  Quay Burow MD      Treadmill   MPH  3.3    Grade  2    Minutes  15    METs  4.21      NuStep   Level  3    SPM  80    Minutes  15    METs  3      Recumbant Elliptical   Level  2    RPM  50    Minutes  15    METs  3      Prescription Details   Frequency (times per week)  3    Duration  Progress to 30 minutes of continuous aerobic without signs/symptoms of physical distress      Intensity   THRR 40-80% of Max Heartrate  105-136    Ratings of Perceived Exertion  11-13    Perceived Dyspnea  0-4      Progression   Progression  Continue to progress workloads to maintain intensity without signs/symptoms of physical distress.      Resistance Training   Training Prescription  Yes    Weight  4 lbs    Reps  10-15       Perform Capillary Blood Glucose checks as needed.  Exercise Prescription Changes: Exercise Prescription Changes    Row Name 05/18/19 1200 05/25/19 1000 06/01/19 0800 06/12/19 1100 06/24/19 1300     Response to Exercise   Blood Pressure (Admit)  136/70  128/74  -  122/70  140/80   Blood Pressure (Exercise)  144/76  126/64  -  134/72  140/62   Blood Pressure (Exit)  136/70  124/72  -  108/70  130/80   Heart Rate (Admit)  73 bpm  57 bpm  -  66 bpm  72 bpm   Heart Rate (Exercise)  107 bpm  110 bpm  -  108 bpm  82 bpm   Heart Rate (Exit)  66 bpm  84 bpm  -  73 bpm  66 bpm   Oxygen Saturation (Admit)  99 %  -  -  -  -   Oxygen Saturation (Exercise)  99 %  -  -  -  -   Rating of Perceived Exertion (Exercise)  7  12  -  12  12   Symptoms  none  none   -  none  none   Comments  walk test results  second full day of exercise  -  -  -   Duration  -   Continue with 30 min of aerobic exercise without signs/symptoms of physical distress.  -  Continue with 30 min of aerobic exercise without signs/symptoms of physical distress.  Continue with 30 min of aerobic exercise without signs/symptoms of physical distress.   Intensity  -  THRR unchanged  -  THRR unchanged  THRR unchanged     Progression   Progression  -  Continue to progress workloads to maintain intensity without signs/symptoms of physical distress.  -  Continue to progress workloads to maintain intensity without signs/symptoms of physical distress.  Continue to progress workloads to maintain intensity without signs/symptoms of physical distress.   Average METs  -  3.52  -  3.67  3.6     Resistance Training   Training Prescription  -  Yes  -  Yes  Yes   Weight  -  4 lbs  -  4 lb  4 lb   Reps  -  10-15  -  10-15  10-15     Interval Training   Interval Training  -  No  -  No  -     Treadmill   MPH  -  3.3  -  3.5  3.5   Grade  -  2  -  2  2   Minutes  -  15  -  15  15   METs  -  4.44  -  4.64  4.64     NuStep   Level  -  3  -  4  4   SPM  -  -  -  80  80   Minutes  -  15  -  15  15   METs  -  2.6  -  2.6  2.4     Recumbant Elliptical   Level  -  -  -  -  2   RPM  -  -  -  -  50   Minutes  -  -  -  -  15   METs  -  -  -  -  1.5     Home Exercise Plan   Plans to continue exercise at  -  -  Home (comment) walking, weights  Home (comment) walking, weights  Home (comment) walking, weights   Frequency  -  -  Add 1 additional day to program exercise sessions.  Add 1 additional day to program exercise sessions.  Add 1 additional day to program exercise sessions.   Initial Home Exercises Provided  -  -  06/01/19  06/01/19  -   [place] Name 07/10/19 1200 07/21/19 1400           Response to Exercise   Blood Pressure (Admit)  106/60  128/74  Blood Pressure (Exercise)  128/64  136/70      Blood Pressure (Exit)  108/60  130/64      Heart Rate (Admit)  99 bpm  83 bpm      Heart  Rate (Exercise)  108 bpm  106 bpm      Heart Rate (Exit)  66 bpm  80 bpm      Rating of Perceived Exertion (Exercise)  15  13      Symptoms  none  none      Duration  Continue with 30 min of aerobic exercise without signs/symptoms of physical distress.  Continue with 30 min of aerobic exercise without signs/symptoms of physical distress.      Intensity  THRR unchanged  THRR unchanged        Progression   Progression  Continue to progress workloads to maintain intensity without signs/symptoms of physical distress.  Continue to progress workloads to maintain intensity without signs/symptoms of physical distress.      Average METs  4.65 on TM  2.91        Resistance Training   Training Prescription  Yes  Yes      Weight  4 lb  4 lbs      Reps  10-15  10-15        Interval Training   Interval Training  No  No        Treadmill   MPH  3.5  3.5      Grade  2  0.5      Minutes  15  15      METs  4.64  3.92        NuStep   Level  4  4      SPM  80  -      Minutes  15  15      METs  2.9  2.4        Recumbant Elliptical   Level  2  2      RPM  50  -      Minutes  15  15      METs  2.1  2.1        Home Exercise Plan   Plans to continue exercise at  Home (comment) walking, weights  Home (comment) walking, weights      Frequency  Add 1 additional day to program exercise sessions.  Add 1 additional day to program exercise sessions.      Initial Home Exercises Provided  -  06/01/19         Exercise Comments: Exercise Comments    Row Name 05/22/19 0750 07/13/19 0909         Exercise Comments  First full day of exercise!  Patient was oriented to gym and equipment including functions, settings, policies, and procedures.  Patient's individual exercise prescription and treatment plan were reviewed.  All starting workloads were established based on the results of the 6 minute walk test done at initial orientation visit.  The plan for exercise progression was also introduced and  progression will be customized based on patient's performance and goals.  NoTreadmill today and for a week. Maggi is experiencing increased foot pain and thinks may be from walking on the treadmill. She has a history of foot trouble. She wants to try a week without using the treadmill and reevaluate after the week is over.         Exercise Goals and Review: Exercise Goals  [place] Name 04/15/19 1521 05/18/19 1219           Exercise Goals   Increase Physical Activity  Yes  Yes      Intervention  Provide advice, education, support and counseling about physical activity/exercise needs.;Develop an individualized exercise prescription for aerobic and resistive training based on initial evaluation findings, risk stratification, comorbidities and participant's personal goals.  Provide advice, education, support and counseling about physical activity/exercise needs.;Develop an individualized exercise prescription for aerobic and resistive training based on initial evaluation findings, risk stratification, comorbidities and participant's personal goals.      Expected Outcomes  Short Term: Attend rehab on a regular basis to increase amount of physical activity.;Long Term: Add in home exercise to make exercise part of routine and to increase amount of physical activity.;Long Term: Exercising regularly at least 3-5 days a week.  Short Term: Attend rehab on a regular basis to increase amount of physical activity.;Long Term: Add in home exercise to make exercise part of routine and to increase amount of physical activity.;Long Term: Exercising regularly at least 3-5 days a week.      Increase Strength and Stamina  Yes  Yes      Intervention  Provide advice, education, support and counseling about physical activity/exercise needs.;Develop an individualized exercise prescription for aerobic and resistive training based on initial evaluation findings, risk stratification, comorbidities and participant's personal  goals.  Provide advice, education, support and counseling about physical activity/exercise needs.;Develop an individualized exercise prescription for aerobic and resistive training based on initial evaluation findings, risk stratification, comorbidities and participant's personal goals.      Expected Outcomes  Short Term: Increase workloads from initial exercise prescription for resistance, speed, and METs.;Short Term: Perform resistance training exercises routinely during rehab and add in resistance training at home;Long Term: Improve cardiorespiratory fitness, muscular endurance and strength as measured by increased METs and functional capacity (6MWT)  Short Term: Increase workloads from initial exercise prescription for resistance, speed, and METs.;Short Term: Perform resistance training exercises routinely during rehab and add in resistance training at home;Long Term: Improve cardiorespiratory fitness, muscular endurance and strength as measured by increased METs and functional capacity (6MWT)      Able to understand and use rate of perceived exertion (RPE) scale  Yes  Yes      Intervention  Provide education and explanation on how to use RPE scale  Provide education and explanation on how to use RPE scale      Expected Outcomes  Short Term: Able to use RPE daily in rehab to express subjective intensity level;Long Term:  Able to use RPE to guide intensity level when exercising independently  Short Term: Able to use RPE daily in rehab to express subjective intensity level;Long Term:  Able to use RPE to guide intensity level when exercising independently      Able to understand and use Dyspnea scale  Yes  Yes      Intervention  Provide education and explanation on how to use Dyspnea scale  Provide education and explanation on how to use Dyspnea scale      Expected Outcomes  Short Term: Able to use Dyspnea scale daily in rehab to express subjective sense of shortness of breath during exertion;Long Term: Able  to use Dyspnea scale to guide intensity level when exercising independently  Short Term: Able to use Dyspnea scale daily in rehab to express subjective sense of shortness of breath during exertion;Long Term: Able to use Dyspnea scale to guide intensity level when  exercising independently      Knowledge and understanding of Target Heart Rate Range (THRR)  Yes  Yes      Intervention  Provide education and explanation of THRR including how the numbers were predicted and where they are located for reference  Provide education and explanation of THRR including how the numbers were predicted and where they are located for reference      Expected Outcomes  Short Term: Able to state/look up THRR;Short Term: Able to use daily as guideline for intensity in rehab;Long Term: Able to use THRR to govern intensity when exercising independently  Short Term: Able to state/look up THRR;Short Term: Able to use daily as guideline for intensity in rehab;Long Term: Able to use THRR to govern intensity when exercising independently      Able to check pulse independently  Yes  Yes      Intervention  Provide education and demonstration on how to check pulse in carotid and radial arteries.;Review the importance of being able to check your own pulse for safety during independent exercise  Provide education and demonstration on how to check pulse in carotid and radial arteries.;Review the importance of being able to check your own pulse for safety during independent exercise      Expected Outcomes  Short Term: Able to explain why pulse checking is important during independent exercise;Long Term: Able to check pulse independently and accurately  Short Term: Able to explain why pulse checking is important during independent exercise;Long Term: Able to check pulse independently and accurately      Understanding of Exercise Prescription  Yes  Yes      Intervention  Provide education, explanation, and written materials on patient's  individual exercise prescription  Provide education, explanation, and written materials on patient's individual exercise prescription      Expected Outcomes  Short Term: Able to explain program exercise prescription;Long Term: Able to explain home exercise prescription to exercise independently  Short Term: Able to explain program exercise prescription;Long Term: Able to explain home exercise prescription to exercise independently         Exercise Goals Re-Evaluation : Exercise Goals Re-Evaluation    Row Name 05/04/19 7026 05/22/19 0751 05/25/19 1028 06/01/19 0807 06/12/19 1131     Exercise Goal Re-Evaluation   Exercise Goals Review  Increase Physical Activity;Increase Strength and Stamina;Understanding of Exercise Prescription  Understanding of Exercise Prescription;Increase Physical Activity;Increase Strength and Stamina;Able to understand and use rate of perceived exertion (RPE) scale;Knowledge and understanding of Target Heart Rate Range (THRR)  Increase Physical Activity;Increase Strength and Stamina;Understanding of Exercise Prescription  Increase Physical Activity;Increase Strength and Stamina;Understanding of Exercise Prescription  Increase Physical Activity;Increase Strength and Stamina;Able to understand and use rate of perceived exertion (RPE) scale;Knowledge and understanding of Target Heart Rate Range (THRR);Able to check pulse independently;Understanding of Exercise Prescription   Comments  Lenette is doing well at home.  She has been walking some, but not consistent enough.  She is eager to come into rehab and has scheduled her orientation. She has also been doing some hand weights at home as well.  She has been aiming for 3-4 days a week for about 10-15 minutes each.  Reviewed RPE scale, THR and program prescription with pt today.  Pt voiced understanding and was given a copy of goals to take home.  Brianna is off to a good start in rehab. She has completed her first two full days of  exercise.  We will continue to monitor her progression.  Sunita is still doing her one extra day a week at home.  She is also doing her weights at home.  Updated home exercise with pt today.  Pt plans to continue to walk at home for exercise.  Reviewed THR, pulse, RPE, sign and symptoms, NTG use, and when to call 911 or MD.  Also discussed weather considerations and indoor options.  Pt voiced understanding.  Darria has increased speed and grade on TM.  She is up to 4 lb for strength training.   Expected Outcomes  Short: Complete 6MWT.  Long: Attend rehab consistently  Short: Use RPE daily to regulate intensity. Long: Follow program prescription in THR.  Short: Continue to attend rehab regularly  Long: Continue to follow program prescription.  Short: Continue to attend rehab. Long: Continue to exercise independently.  Short - continue current programs at home and HT Long - improve overall MET level   Row Name 06/15/19 0254 06/24/19 1333 07/06/19 0818 07/10/19 1248 07/21/19 1444     Exercise Goal Re-Evaluation   Exercise Goals Review  Increase Physical Activity;Increase Strength and Stamina;Understanding of Exercise Prescription  Increase Physical Activity;Increase Strength and Stamina;Able to understand and use rate of perceived exertion (RPE) scale;Knowledge and understanding of Target Heart Rate Range (THRR);Able to check pulse independently;Understanding of Exercise Prescription  Increase Physical Activity;Increase Strength and Stamina;Understanding of Exercise Prescription  Increase Physical Activity;Increase Strength and Stamina;Able to understand and use rate of perceived exertion (RPE) scale;Knowledge and understanding of Target Heart Rate Range (THRR);Able to check pulse independently;Understanding of Exercise Prescription  Increase Physical Activity;Increase Strength and Stamina;Understanding of Exercise Prescription   Comments  Chrystal is doing well in rehab.  She has noticed that when she goes out  to walk it just wipes her out.  We talked about it being due to the heat and humidity.  She will try to get up to go out earlier in the day to help.  She has noticed that her strength and stamina are improving overall. She can tell that rehab has really made a difference for her.  Joei missed a day due to back pain.  She has been back and did seated machines.  Parlee is doing well in rehab.  She is getting more at home and going on her off days from class!!  She feels that her strength and stamina are continuing to recover!!  Diamonique has increased workload on TM.  She attends consistently and works in correct RPE range  Dwanda continues to do well in rehab.  The treadmill has been bothering her foot.  It sounds as though she may have the beginnings of plantar facisitis and we talked about some exercises to help her relieve this.  She is now up to level 4 on the NuStep and using 4 lbs weights.  We will talk about adding in intervals.  We will continue to monitor her progress.   Expected Outcomes  Short: Try to get up earlier on off days for exercise.  Long: Continue to improve strength and stamina.  Short - continue to attend consistently Long - improve overall MET levels  Short: Continue to exercise on off days.  Long: Continue to improve strength and stamina.  Short - stay consistent with exercise Long - improve overall MET level  Short: Talk about adding in intervals.  Long; Continue to to improve stamina.      Discharge Exercise Prescription (Final Exercise Prescription Changes): Exercise Prescription Changes - 07/21/19 1400      Response to  Exercise   Blood Pressure (Admit)  128/74    Blood Pressure (Exercise)  136/70    Blood Pressure (Exit)  130/64    Heart Rate (Admit)  83 bpm    Heart Rate (Exercise)  106 bpm    Heart Rate (Exit)  80 bpm    Rating of Perceived Exertion (Exercise)  13    Symptoms  none    Duration  Continue with 30 min of aerobic exercise without signs/symptoms of  physical distress.    Intensity  THRR unchanged      Progression   Progression  Continue to progress workloads to maintain intensity without signs/symptoms of physical distress.    Average METs  2.91      Resistance Training   Training Prescription  Yes    Weight  4 lbs    Reps  10-15      Interval Training   Interval Training  No      Treadmill   MPH  3.5    Grade  0.5    Minutes  15    METs  3.92      NuStep   Level  4    Minutes  15    METs  2.4      Recumbant Elliptical   Level  2    Minutes  15    METs  2.1      Home Exercise Plan   Plans to continue exercise at  Home (comment)   walking, weights   Frequency  Add 1 additional day to program exercise sessions.    Initial Home Exercises Provided  06/01/19       Nutrition:  Target Goals: Understanding of nutrition guidelines, daily intake of sodium <1512m, cholesterol <2033m calories 30% from fat and 7% or less from saturated fats, daily to have 5 or more servings of fruits and vegetables.  Biometrics: Pre Biometrics - 05/18/19 1219      Pre Biometrics   Height  5' 6"  (1.676 m)    Weight  184 lb (83.5 kg)    BMI (Calculated)  29.71    Single Leg Stand  22.78 seconds        Nutrition Therapy Plan and Nutrition Goals: Nutrition Therapy & Goals - 04/20/19 1502      Nutrition Therapy   Diet  HH, low Na diet    Drug/Food Interactions  Statins/Certain Fruits   crestor   Protein (specify units)  65g    Fiber  25 grams    Whole Grain Foods  3 servings    Saturated Fats  12 max. grams    Fruits and Vegetables  5 servings/day    Sodium  1.5 grams      Personal Nutrition Goals   Nutrition Goal  ST: LT: maintain wt, increase energy further, manage healthy    Comments  Sometimes won't eat most of the morning. chicken salad tomatoes, zucchini. Will also eat yogurt, yoplait.  Walnuts before bed. When she does eat B she has waffles, toast tu[place]acon, boiled egg and grits. 60oz water per day. End of March to  April would eat 2 meals/day and now eats 1 big meal. Since heart event cut out snacks, soft drinks (used to have 2-3/day). 179lbs maintaing wt. discussed [employer], HH eating, Na, Fiber, Fat, added sugar and added salt.      Intervention Plan   Intervention  Prescribe, educate and counsel regarding individualized specific dietary modifications aiming towards targeted core components such as weight,  hypertension, lipid management, diabetes, heart failure and other comorbidities.;Nutrition handout(s) given to patient.   Nutrition info given in email   Expected Outcomes  Short Term Goal: Understand basic principles of dietary content, such as calories, fat, sodium, cholesterol and nutrients.;Short Term Goal: A plan has been developed with personal nutrition goals set during dietitian appointment.;Long Term Goal: Adherence to prescribed nutrition plan.       Nutrition Assessments: Nutrition Assessments - 05/15/19 1213      MEDFICTS Scores   Pre Score  17       Nutrition Goals Re-Evaluation: Nutrition Goals Re-Evaluation    Row Name 05/29/19 [phone] 06/17/19 0827 07/06/19 0837         Goals   Current Weight  184 lb 11.2 oz (83.8 kg)  188 lb 1.6 oz (85.3 kg)  186 lb 6.4 oz (84.6 kg)     Nutrition Goal  ST: continue to limit added salt and sugar LT: maintain wt, increase energy further, manage healthy  ST: try more variety LT: maintain wt, increase energy further, manage healthy  ST: try more variety and add flexibility to diet LT: maintain wt, increase energy further, manage healthy     Comment  Pt reports not wanting to make specific goals, but she is currently working on limiting her Na and added sugar intake; she used to drink 2-3 sodas per day and is now at 1/week. Pt not counting her daily sodium, but is choosing low sodium options. Pt is working on label reading. Encouraged pt that she is making progress and doing well. Told pt that if she needed any resources or help to let me know.  Pt reports  using her air fryer and limitng Na and added sugar in diet as well as incorporating lean proteins, fruits and vegetables. Pt reports slow progress, encouraged pt that it is a process and slow changes make for lasting changes. pt reports needing more variety but doesn't follow recipes, told her that she could utilize recipes as a source of ideas and gave her some resources for [facility] recipes as well as some healthy swaps. Pt reports still having only one meal/day. Pt asked about protein shakes, discussed how it is not a replacement for a meal but could be a good supplement to her diet, especially if she is only having one meal; discussed how it can be hard to meet needs with only one meal.  Pt reports finding less pleasure in food and will sometimes opt not to eat or have some peanut butter and crackers instead of a meal. Pt reports still eating one meal a day. Discussed ways to add more variety and how to make food more exciting by thinking outside of the box (burrito bowls, chili, tacos, for example) - pt seemed open to those ideas. told pt to let RD know if she needs any more resources or if she needs ideas for specific ingredients. Discussed how even choosing less health optimal choices in order to make sure she gets enough calories and protein in a day. Discussed ways to enhance her steamed vegetables like choosing to roast vegetable with olive oil and spices to increase healthy fats, kcal, and emotional satiety.     Expected Outcome  ST: continue to limit added salt and sugar LT: maintain wt, increase energy further, manage healthy  Increase variety and increase sustainability  Increase variety and increase sustainability        Nutrition Goals Discharge (Final Nutrition Goals Re-Evaluation): Nutrition Goals Re-Evaluation - 07/06/19  4163      Goals   Current Weight  186 lb 6.4 oz (84.6 kg)    Nutrition Goal  ST: try more variety and add flexibility to diet LT: maintain wt, increase energy further, manage  healthy    Comment  Pt reports finding less pleasure in food and will sometimes opt not to eat or have some peanut butter and crackers instead of a meal. Pt reports still eating one meal a day. Discussed ways to add more variety and how to make food more exciting by thinking outside of the box (burrito bowls, chili, tacos, for example) - pt seemed open to those ideas. told pt to let RD know if she needs any more resources or if she needs ideas for specific ingredients. Discussed how even choosing less health optimal choices in order to make sure she gets enough calories and protein in a day. Discussed ways to enhance her steamed vegetables like choosing to roast vegetable with olive oil and spices to increase healthy fats, kcal, and emotional satiety.    Expected Outcome  Increase variety and increase sustainability       Psychosocial: Target Goals: Acknowledge presence or absence of significant depression and/or stress, maximize coping skills, provide positive support system. Participant is able to verbalize types and ability to use techniques and skills needed for reducing stress and depression.   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      Family Dynamics   Good Support System?  Yes   husband     Barriers   Psychosocial barriers to participate in program  There are no identifiable barriers or psychosocial needs.;The patient should benefit from training in stress management and relaxation.      Screening Interventions   Interventions  Encouraged to exercise;Provide feedback about the scores to participant;To provide support and resources with identified psychosocial needs    Expected Outcomes  Short Term goal: Utilizing psychosocial counselor, staff and physician  to assist with identification of specific Stressors or current issues interfering with healing process. Setting desired goal for each stressor or current issue identified.;Long Term Goal: Stressors or current issues are controlled or eliminated.;Short Term goal: Identification and review with participant of any Quality of Life or Depression concerns found by scoring the questionnaire.;Long Term goal: The participant improves quality of Life and PHQ9 Scores as seen by post scores and/or verbalization of changes       Quality of Life Scores:  Quality of Life - 05/15/19 1212      Quality of Life   Select  Quality of Life      Quality of Life Scores   Health/Function Pre  26.6 %    Socioeconomic Pre  26.43 %    Psych/Spiritual Pre  29.14 %    Family Pre  23.4 %    GLOBAL Pre  26.61 %      Scores of 19 and below usually indicate a poorer quality of life in these areas.  A difference of  2-3 points is a clinically meaningful difference.  A difference of 2-3 points in the total score of the Quality of Life Index has been associated with significant improvement in overall quality of life, self-image, physical symptoms, and  general health in studies assessing change in quality of life.  PHQ-9: Recent Review Flowsheet Data    Depression screen [facility] 2/9 05/18/2019 12/31/2018 10/03/2017 09/16/2017 07/27/2016   Decreased Interest 1 0 0 0 0   Down, Depressed, Hopeless 0 0 0 0 0   PHQ - 2 Score 1 0 0 0 0   Altered sleeping 0 - 3 - -   Tired, decreased energy 1 - 1 - -   Change in appetite 0 - 0 - -   Feeling bad or failure about yourself  0 - 0 - -   Trouble concentrating 0 - 0 - -   Moving slowly or fidgety/restless 0 - 0 - -   Suicidal thoughts 0 - 0 - -   PHQ-9 Score 2 - 4 - -   Difficult doing work/chores Not difficult at all - Not difficult at all - -     Interpretation of Total Score  Total Score Depression Severity:  1-4 = Minimal depression, 5-9 = Mild depression, 10-14 = Moderate  depression, 15-19 = Moderately severe depression, 20-27 = Severe depression   Psychosocial Evaluation and Intervention:   Psychosocial Re-Evaluation: Psychosocial Re-Evaluation    Row Name 06/15/19 0825 07/06/19 6811           Psychosocial Re-Evaluation   Current issues with  None Identified  Current Sleep Concerns      Comments  Melisa is doing well mentally.  She is feeling good overall.  She does not have any current stress concerns or depression.  She is sleeping well most night unless she has caffiene too close to bed time.  Jyl Heinz is doing well mentally and doing well in rehab.  She does not have any stressors or concerns.  Her sleep is still a bit of an issue.  Most nights she is able to get at least 6 hrs.  We talked about the importance of sleep hygine and going to bed when sleepy.      Expected Outcomes  Short: Continue to stay positive.  Long: Continue to practice self care.  Short: Review sleep information.  Long: Continue to get better rest      Interventions  Encouraged to attend Cardiac Rehabilitation for the exercise  Encouraged to attend Cardiac Rehabilitation for the exercise      Continue Psychosocial Services   Follow up required by counselor  Follow up required by staff         Psychosocial Discharge (Final Psychosocial Re-Evaluation): Psychosocial Re-Evaluation - 07/06/19 0819      Psychosocial Re-Evaluation   Current issues with  Current Sleep Concerns    Comments  Jyl Heinz is doing well mentally and doing well in rehab.  She does not have any stressors or concerns.  Her sleep is still a bit of an issue.  Most nights she is able to get at least 6 hrs.  We talked about the importance of sleep hygine and going to bed when sleepy.    Expected Outcomes  Short: Review sleep information.  Long: Continue to get better rest    Interventions  Encouraged to attend Cardiac Rehabilitation for the exercise    Continue Psychosocial Services   Follow up required by staff        Vocational Rehabilitation: Provide vocational rehab assistance to qualifying candidates.   Vocational Rehab Evaluation & Intervention: Vocational Rehab - 05/08/19 1112      Initial Vocational Rehab Evaluation & Intervention   Assessment shows need for Vocational Rehabilitation  No       Education: Education Goals: Education classes will be provided on a variety of topics geared toward better understanding of heart health and risk factor modification. Participant will state understanding/return demonstration of topics presented as noted by education test scores.  Learning Barriers/Preferences:   Education Topics:  AED/CPR: - Group verbal and written instruction with the use of models to demonstrate the basic use of the AED with the basic ABC's of resuscitation.   General Nutrition Guidelines/Fats and Fiber: -Group instruction provided by verbal, written material, models and posters to present the general guidelines for heart healthy nutrition. Gives an explanation and review of dietary fats and fiber.   Controlling Sodium/Reading Food Labels: -Group verbal and written material supporting the discussion of sodium use in heart healthy nutrition. Review and explanation with models, verbal and written materials for utilization of the food label.   Exercise Physiology & General Exercise Guidelines: - Group verbal and written instruction with models to review the exercise physiology of the cardiovascular system and associated critical values. Provides general exercise guidelines with specific guidelines to those with heart or lung disease.    Aerobic Exercise & Resistance Training: - Gives group verbal and written instruction on the various components of exercise. Focuses on aerobic and resistive training programs and the benefits of this training and how to safely progress through these programs..   Flexibility, Balance, Mind/Body Relaxation: Provides group verbal/written  instruction on the benefits of flexibility and balance training, including mind/body exercise modes such as yoga, pilates and tai chi.  Demonstration and skill practice provided.   Stress and Anxiety: - Provides group verbal and written instruction about the health risks of elevated stress and causes of high stress.  Discuss the correlation between heart/lung disease and anxiety and treatment options. Review healthy ways to manage with stress and anxiety.   Depression: - Provides group verbal and written instruction on the correlation between heart/lung disease and depressed mood, treatment options, and the stigmas associated with seeking treatment.   Anatomy & Physiology of the Heart: - Group verbal and written instruction and models provide basic cardiac anatomy and physiology, with the coronary electrical and arterial systems. Review of Valvular disease and Heart Failure   Cardiac Procedures: - Group verbal and written instruction to review commonly prescribed medications for heart disease. Reviews the medication, class of the drug, and side effects. Includes the steps to properly store meds and maintain the prescription regimen. (beta blockers and nitrates)   Cardiac Medications I: - Group verbal and written instruction to review commonly prescribed medications for heart disease. Reviews the medication, class of the drug, and side effects. Includes the steps to properly store meds and maintain the prescription regimen.   Cardiac Medications II: -Group verbal and written instruction to review commonly prescribed medications for heart disease. Reviews the medication, class of the drug, and side effects. (all other drug classes)    Go Sex-Intimacy & Heart Disease, Get SMART - Goal Setting: - Group verbal and written instruction through game format to discuss heart disease and the return to sexual intimacy. Provides group verbal and written material to discuss and apply goal setting  through the application of the S.M.A.R.T. Method.   Other Matters of the Heart: - Provides group verbal, written materials and models to describe Stable Angina and Peripheral Artery. Includes description of the disease process and treatment options available to the cardiac patient.   Exercise & Equipment Safety: - Individual verbal instruction and demonstration of equipment  use and safety with use of the equipment.   Cardiac Rehab from 05/18/2019 in [facility] Cardiac and Pulmonary Rehab  Date  05/18/19  Educator  [facility]  Instruction Review Code  1- Verbalizes Understanding      Infection Prevention: - Provides verbal and written material to individual with discussion of infection control including proper hand washing and proper equipment cleaning during exercise session.   Cardiac Rehab from 05/18/2019 in [facility] Cardiac and Pulmonary Rehab  Date  05/18/19  Educator  [facility]  Instruction Review Code  1- Verbalizes Understanding      Falls Prevention: - Provides verbal and written material to individual with discussion of falls prevention and safety.   Cardiac Rehab from 05/18/2019 in [facility] Cardiac and Pulmonary Rehab  Date  05/18/19  Educator  [facility]  Instruction Review Code  1- Verbalizes Understanding      Diabetes: - Individual verbal and written instruction to review signs/symptoms of diabetes, desired ranges of glucose level fasting, after meals and with exercise. Acknowledge that pre and post exercise glucose checks will be done for 3 sessions at entry of program.   Know Your Numbers and Risk Factors: -Group verbal and written instruction about important numbers in your health.  Discussion of what are risk factors and how they play a role in the disease process.  Review of Cholesterol, Blood Pressure, Diabetes, and BMI and the role they play in your overall health.   Sleep Hygiene: -Provides group verbal and written instruction about how sleep can affect your health.  Define sleep hygiene,  discuss sleep cycles and impact of sleep habits. Review good sleep hygiene tips.    Other: -Provides group and verbal instruction on various topics (see comments)   Knowledge Questionnaire Score: Knowledge Questionnaire Score - 05/15/19 1213      Knowledge Questionnaire Score   Pre Score  17/26   Stress, PAD, Exercise,Nutrition and Tobacco questions missed correxct response       Core Components/Risk Factors/Patient Goals at Admission: Personal Goals and Risk Factors at Admission - 05/18/19 1220      Core Components/Risk Factors/Patient Goals on Admission    Weight Management  Yes;Weight Loss    Intervention  Weight Management: Develop a combined nutrition and exercise program designed to reach desired caloric intake, while maintaining appropriate intake of nutrient and fiber, sodium and fats, and appropriate energy expenditure required for the weight goal.;Weight Management: Provide education and appropriate resources to help participant work on and attain dietary goals.    Admit Weight  184 lb (83.5 kg)    Goal Weight: Short Term  178 lb (80.7 kg)    Goal Weight: Long Term  170 lb (77.1 kg)    Expected Outcomes  Short Term: Continue to assess and modify interventions until short term weight is achieved;Long Term: Adherence to nutrition and physical activity/exercise program aimed toward attainment of established weight goal;Weight Loss: Understanding of general recommendations for a balanced deficit meal plan, which promotes 1-2 lb weight loss per week and includes a negative energy balance of [phone] kcal/d;Understanding recommendations for meals to include 15-35% energy as protein, 25-35% energy from fat, 35-60% energy from carbohydrates, less than 293m of dietary cholesterol, 20-35 gm of total fiber daily;Understanding of distribution of calorie intake throughout the day with the consumption of 4-5 meals/snacks    Lipids  Yes    Intervention  Provide education and support for  participant on nutrition & aerobic/resistive exercise along with prescribed medications to achieve LDL <765m HDL >40104m  Expected Outcomes  Short Term: Participant states understanding of desired cholesterol values and is compliant with medications prescribed. Participant is following exercise prescription and nutrition guidelines.;Long Term: Cholesterol controlled with medications as prescribed, with individualized exercise RX and with personalized nutrition plan. Value goals: LDL < 70m, HDL > 40 mg.       Core Components/Risk Factors/Patient Goals Review:  Goals and Risk Factor Review    Row Name 05/04/19 0725308/24/20 0827 07/06/19 06644        Core Components/Risk Factors/Patient Goals Review   Personal Goals Review  Weight Management/Obesity;Hypertension  Weight Management/Obesity;Hypertension  Weight Management/Obesity;Hypertension     Review  Overall she is doing well.  Her weight has stayed steady and her blood pressures have been doing well.  She does have a follow up scheduled with the surgeon on Wednesday of this week for increased incisional soreness and right sided tightness in her chest.  She is 4 month post surgery so within the normal window for pain, but she does have a chest xray scheduled for that day to make sure nothing else is going on.  She will keep uKoreaposted.  MNyaziais doing well in rehab. She is not losing weight like she wants.  So we talked abour walking longer at home and adding in intervals in rehab.  Her blood pressures have been good and she checks them daily.  Her next follow up is with her primary tomorrow.  MLadyeis doing well in rehab.  Her weight has been steady which is frustrating her.  She continues to walk more and using intervals and workload increases in rehab.  Her pressures continue to do well in rehab.  She did well at her f/u appt but was deficient in magnesium which she is now taking.     Expected Outcomes  Short: Talk to doctor about  chest/incisional pain.  Long: Continue to monitor risk factors  Short: Talk to doctor tomorrow.  Long: Continue to work on weight loss.  Short: Continue to work on weight loss.  Long: Continue to monitor risk factors.        Core Components/Risk Factors/Patient Goals at Discharge (Final Review):  Goals and Risk Factor Review - 07/06/19 0823      Core Components/Risk Factors/Patient Goals Review   Personal Goals Review  Weight Management/Obesity;Hypertension    Review  MMalayasiais doing well in rehab.  Her weight has been steady which is frustrating her.  She continues to walk more and using intervals and workload increases in rehab.  Her pressures continue to do well in rehab.  She did well at her f/u appt but was deficient in magnesium which she is now taking.    Expected Outcomes  Short: Continue to work on weight loss.  Long: Continue to monitor risk factors.       ITP Comments: ITP Comments    Row Name 04/14/19 1656 04/15/19 1519 05/04/19 0918 05/08/19 1119 05/18/19 1215   ITP Comments  Initial Cardiac Home Based Care visit completed Intake and Consent completed Appts for followup done with EP and RD  Completed initial ExRx created and sent to Dr. MEmily Filbert Medical Director to review and sign.  Completed virtual visit. Scheduled to orient on 7/17 and 7/21.  Orientation via phone completed today   Gym orientation with  EP/RD Eval is scheduled for 7/27 at 11 AM.  MVincyis having surgery to remove the sternal wires on Mon 7/20.  Completed 6MWT and gym orientation today.  Documentation for diagnosis can be found in [facility] encounter 01/05/19.  Initial ITP created and sent for review to Dr. Emily Filbert, Medical Director.   [place] Name 06/03/19 [phone] 07/01/19 0616 07/13/19 0909 07/29/19 1048     ITP Comments  30 Day Review Completed today. Continue with ITP unless changed by Medical Director review.  New to program  30 Day review. Continue with ITP unless directed changes per Medical Director review.   NoTreadmill today and for a week. Jasiel is experiencing increased foot pain and thinks may be from walking on the treadmill. She has a history of foot trouble. She wants to try a week without using the treadmill and reevaluate after the week is over.  30 day review completed. ITP sent to Dr. Emily Filbert, Medical Director of Cardiac and Pulmonary Rehab. Continue with ITP unless changes are made by physician.  Department closed starting 10/2 until further notice by infection prevention and Health at Work teams for [place].       Comments: 30 day review

## 2019-08-03 ENCOUNTER — Other Ambulatory Visit: Payer: Self-pay

## 2019-08-03 ENCOUNTER — Encounter: Payer: Medicare Other | Admitting: *Deleted

## 2019-08-03 DIAGNOSIS — Z951 Presence of aortocoronary bypass graft: Secondary | ICD-10-CM | POA: Diagnosis not present

## 2019-08-03 NOTE — Progress Notes (Signed)
Daily Session Note  Patient Details  Name: Jessica [name] MRN: [id number] Date of Birth: 03-Apr-1951 Referring Provider:     Cardiac Rehab from 05/18/2019 in [facility] Cardiac and Pulmonary Rehab  Referring Provider  Quay Burow MD      Encounter Date: 08/03/2019  Check In: Session Check In - 08/03/19 0759      Check-In   Supervising physician immediately available to respond to emergencies  See telemetry face sheet for immediately available ER MD    Location  [facility]-Cardiac & Pulmonary Rehab    Staff Present  Heath Lark, RN, BSN, CCRP;Jeanna Durrell BS, Exercise Physiologist;Joseph Hood RCP,RRT,BSRT    Virtual Visit  No    Medication changes reported      No    Fall or balance concerns reported     No    Warm-up and Cool-down  Performed on first and last piece of equipment    Resistance Training Performed  Yes    VAD Patient?  No    PAD/SET Patient?  No      Pain Assessment   Currently in Pain?  No/denies          Social History   Tobacco Use  Smoking Status Never Smoker  Smokeless Tobacco Never Used    Goals Met:  Independence with exercise equipment Exercise tolerated well Personal goals reviewed No report of cardiac concerns or symptoms  Goals Unmet:  Not Applicable  Comments: Pt able to follow exercise prescription today without complaint.  Will continue to monitor for progression.    Dr. Emily Filbert is Medical Director for [place] and [facility].

## 2019-08-05 ENCOUNTER — Other Ambulatory Visit: Payer: Self-pay

## 2019-08-05 ENCOUNTER — Encounter: Payer: Medicare Other | Admitting: *Deleted

## 2019-08-05 DIAGNOSIS — Z951 Presence of aortocoronary bypass graft: Secondary | ICD-10-CM

## 2019-08-05 NOTE — Progress Notes (Signed)
Daily Session Note  Patient Details  Name: Jessica [name] MRN: [id number] Date of Birth: 09/09/1951 Referring Provider:     Cardiac Rehab from 05/18/2019 in [facility] Cardiac and Pulmonary Rehab  Referring Provider  Quay Burow MD      Encounter Date: 08/05/2019  Check In: Session Check In - 08/05/19 0758      Check-In   Supervising physician immediately available to respond to emergencies  See telemetry face sheet for immediately available ER MD    Location  [facility]-Cardiac & Pulmonary Rehab    Staff Present  Gerlene Burdock, RN, BSN-BC, CCRP;Evelette Hollern Broughton, MA, RCEP, CCRP, CCET;Joseph Hood RCP,RRT,BSRT    Virtual Visit  No    Medication changes reported      No    Fall or balance concerns reported     No    Warm-up and Cool-down  Performed on first and last piece of equipment    Resistance Training Performed  Yes    VAD Patient?  No    PAD/SET Patient?  No      Pain Assessment   Currently in Pain?  No/denies          Social History   Tobacco Use  Smoking Status Never Smoker  Smokeless Tobacco Never Used    Goals Met:  Independence with exercise equipment Exercise tolerated well No report of cardiac concerns or symptoms Strength training completed today  Goals Unmet:  Not Applicable  Comments: Pt able to follow exercise prescription today without complaint.  Will continue to monitor for progression.    Dr. Emily Filbert is Medical Director for [place] and [facility].

## 2019-08-07 ENCOUNTER — Encounter: Payer: Medicare Other | Admitting: *Deleted

## 2019-08-07 ENCOUNTER — Other Ambulatory Visit: Payer: Self-pay

## 2019-08-07 DIAGNOSIS — Z951 Presence of aortocoronary bypass graft: Secondary | ICD-10-CM | POA: Diagnosis not present

## 2019-08-07 NOTE — Progress Notes (Signed)
Daily Session Note  Patient Details  Name: Jessica [name] MRN: [id number] Date of Birth: [birth year] Referring Provider:     Cardiac Rehab from 05/18/2019 in [facility] Cardiac and Pulmonary Rehab  Referring Provider  Quay Burow MD      Encounter Date: 08/07/2019  Check In: Session Check In - 08/07/19 0800      Check-In   Supervising physician immediately available to respond to emergencies  See telemetry face sheet for immediately available ER MD    Location  [facility]-Cardiac & Pulmonary Rehab    Staff Present  Heath Lark, RN, BSN, CCRP;Jessica [place], MA, RCEP, CCRP, [place], [place], ACSM CEP, Exercise Physiologist    Virtual Visit  No    Medication changes reported      No    Fall or balance concerns reported     No    Warm-up and Cool-down  Performed on first and last piece of equipment    Resistance Training Performed  Yes    VAD Patient?  No    PAD/SET Patient?  No      Pain Assessment   Currently in Pain?  No/denies          Social History   Tobacco Use  Smoking Status Never Smoker  Smokeless Tobacco Never Used    Goals Met:  Independence with exercise equipment Exercise tolerated well No report of cardiac concerns or symptoms  Goals Unmet:  Not Applicable  Comments: Pt able to follow exercise prescription today without complaint.  Will continue to monitor for progression.    Dr. Emily Filbert is Medical Director for [place] and [facility].

## 2019-08-10 ENCOUNTER — Other Ambulatory Visit: Payer: Self-pay

## 2019-08-10 ENCOUNTER — Encounter: Payer: Medicare Other | Admitting: *Deleted

## 2019-08-10 DIAGNOSIS — Z951 Presence of aortocoronary bypass graft: Secondary | ICD-10-CM | POA: Diagnosis not present

## 2019-08-10 NOTE — Progress Notes (Signed)
Daily Session Note  Patient Details  Name: MONROE TOURE MRN: [id number] Date of Birth: [birth year] Referring Provider:     Cardiac Rehab from 05/18/2019 in [facility] Cardiac and Pulmonary Rehab  Referring Provider  Quay Burow MD      Encounter Date: 08/10/2019  Check In: Session Check In - 08/10/19 0815      Check-In   Supervising physician immediately available to respond to emergencies  See telemetry face sheet for immediately available ER MD    Location  [facility]-Cardiac & Pulmonary Rehab    Staff Present  Earlean Shawl, BS, ACSM CEP, Exercise Physiologist;Joseph Flavia Shipper;Heath Lark, RN, BSN, CCRP    Virtual Visit  No    Medication changes reported      No    Fall or balance concerns reported     No    Tobacco Cessation  No Change    Warm-up and Cool-down  Performed on first and last piece of equipment    Resistance Training Performed  Yes    VAD Patient?  No    PAD/SET Patient?  No      Pain Assessment   Currently in Pain?  No/denies    Multiple Pain Sites  No          Social History   Tobacco Use  Smoking Status Never Smoker  Smokeless Tobacco Never Used    Goals Met:  Independence with exercise equipment Exercise tolerated well Personal goals reviewed No report of cardiac concerns or symptoms Strength training completed today  Goals Unmet:  Not Applicable  Comments: Pt able to follow exercise prescription today without complaint.  Will continue to monitor for progression.    Dr. Emily Filbert is Medical Director for [place] and [facility].

## 2019-08-12 ENCOUNTER — Other Ambulatory Visit: Payer: Self-pay

## 2019-08-12 ENCOUNTER — Encounter: Payer: Medicare Other | Admitting: *Deleted

## 2019-08-12 DIAGNOSIS — Z951 Presence of aortocoronary bypass graft: Secondary | ICD-10-CM

## 2019-08-12 NOTE — Progress Notes (Signed)
Daily Session Note  Patient Details  Name: Jessica [name] MRN: [id number] Date of Birth: Sep 07, 1951 Referring Provider:     Cardiac Rehab from 05/18/2019 in [facility] Cardiac and Pulmonary Rehab  Referring Provider  Quay Burow MD      Encounter Date: 08/12/2019  Check In: Session Check In - 08/12/19 0805      Check-In   Supervising physician immediately available to respond to emergencies  See telemetry face sheet for immediately available ER MD    Staff Present  Darel Hong, RN BSN;Jessica Luan [place]ing, MA, RCEP, CCRP, CCET;Joseph Hood RCP,RRT,BSRT    Virtual Visit  No    Medication changes reported      No    Fall or balance concerns reported     No    Tobacco Cessation  No Change    Warm-up and Cool-down  Performed on first and last piece of equipment    Resistance Training Performed  Yes    VAD Patient?  No    PAD/SET Patient?  No      Pain Assessment   Currently in Pain?  No/denies          Social History   Tobacco Use  Smoking Status Never Smoker  Smokeless Tobacco Never Used    Goals Met:  Independence with exercise equipment Exercise tolerated well No report of cardiac concerns or symptoms Strength training completed today  Goals Unmet:  Not Applicable  Comments: Pt able to follow exercise prescription today without complaint.  Will continue to monitor for progression.    Dr. Emily Filbert is Medical Director for [place] and [facility].

## 2019-08-14 ENCOUNTER — Encounter: Payer: Medicare Other | Admitting: *Deleted

## 2019-08-14 ENCOUNTER — Other Ambulatory Visit: Payer: Self-pay

## 2019-08-14 DIAGNOSIS — Z951 Presence of aortocoronary bypass graft: Secondary | ICD-10-CM

## 2019-08-14 NOTE — Progress Notes (Signed)
Daily Session Note  Patient Details  Name: Jessica [name] MRN: [id number] Date of Birth: 06-10-1951 Referring Provider:     Cardiac Rehab from 05/18/2019 in [facility] Cardiac and Pulmonary Rehab  Referring Provider  Quay Burow MD      Encounter Date: 08/14/2019  Check In: Session Check In - 08/14/19 0829      Check-In   Supervising physician immediately available to respond to emergencies  See telemetry face sheet for immediately available ER MD    Location  [facility]-Cardiac & Pulmonary Rehab    Staff Present  Heath Lark, RN, BSN, CCRP;Jessica [place], MA, RCEP, CCRP, [place], [place], ACSM CEP, Exercise Physiologist    Virtual Visit  No    Medication changes reported      No    Fall or balance concerns reported     No    Warm-up and Cool-down  Performed on first and last piece of equipment    Resistance Training Performed  Yes    VAD Patient?  No    PAD/SET Patient?  No      Pain Assessment   Currently in Pain?  No/denies          Social History   Tobacco Use  Smoking Status Never Smoker  Smokeless Tobacco Never Used    Goals Met:  Independence with exercise equipment Exercise tolerated well No report of cardiac concerns or symptoms  Goals Unmet:  Not Applicable  Comments: Pt able to follow exercise prescription today without complaint.  Will continue to monitor for progression.    Dr. Emily Filbert is Medical Director for [place] and [facility].

## 2019-08-17 ENCOUNTER — Encounter: Payer: Medicare Other | Admitting: *Deleted

## 2019-08-17 ENCOUNTER — Other Ambulatory Visit: Payer: Self-pay

## 2019-08-17 VITALS — Ht 66.0 in | Wt 186.7 lb

## 2019-08-17 DIAGNOSIS — Z951 Presence of aortocoronary bypass graft: Secondary | ICD-10-CM

## 2019-08-17 NOTE — Progress Notes (Signed)
Daily Session Note  Patient Details  Name: Jessica [name] MRN: [id number] Date of Birth: 08/09/1951 Referring Provider:     Cardiac Rehab from 05/18/2019 in [facility] Cardiac and Pulmonary Rehab  Referring Provider  Quay Burow MD      Encounter Date: 08/17/2019  Check In: Session Check In - 08/17/19 0804      Check-In   Supervising physician immediately available to respond to emergencies  See telemetry face sheet for immediately available ER MD    Location  [facility]-Cardiac & Pulmonary Rehab    Staff Present  Heath Lark, RN, BSN, Laveda Norman, BS, ACSM CEP, Exercise Physiologist    Virtual Visit  No    Medication changes reported      No    Fall or balance concerns reported     No    Warm-up and Cool-down  Performed on first and last piece of equipment    Resistance Training Performed  Yes    VAD Patient?  No    PAD/SET Patient?  No      Pain Assessment   Currently in Pain?  No/denies          Social History   Tobacco Use  Smoking Status Never Smoker  Smokeless Tobacco Never Used    Goals Met:  Independence with exercise equipment Exercise tolerated well No report of cardiac concerns or symptoms  Goals Unmet:  Not Applicable  Comments: Pt able to follow exercise prescription today without complaint.  Will continue to monitor for progression.  [place] Name 05/18/19 1216 08/17/19 0822       6 Minute Walk   Phase  Initial  Discharge    Distance  1770 feet  1800 feet    Distance % Change  -  2 %    Distance Feet Change  -  30 ft    Walk Time  6 minutes  6 minutes    # of Rest Breaks  0  0    MPH  3.35  3.41    METS  3.86  3.78    RPE  7  13    VO2 Peak  13.49  13.24    Symptoms  No  No    Resting HR  73 bpm  67 bpm    Resting BP  136/70  110/72    Resting Oxygen Saturation   99 %  -    Exercise Oxygen Saturation  during 6 min walk  99 %  -    Max Ex. HR  107 bpm  102 bpm    Max Ex. BP  144/76  138/80    2 Minute Post BP  136/70  -           Dr. Emily Filbert is Medical Director for [place] and [facility].

## 2019-08-19 ENCOUNTER — Encounter: Payer: Medicare Other | Admitting: *Deleted

## 2019-08-19 ENCOUNTER — Other Ambulatory Visit: Payer: Self-pay

## 2019-08-19 DIAGNOSIS — Z951 Presence of aortocoronary bypass graft: Secondary | ICD-10-CM | POA: Diagnosis not present

## 2019-08-19 NOTE — Progress Notes (Signed)
Daily Session Note  Patient Details  Name: Jessica [name] MRN: [id number] Date of Birth: 1950/12/20 Referring Provider:     Cardiac Rehab from 05/18/2019 in [facility] Cardiac and Pulmonary Rehab  Referring Provider  Quay Burow MD      Encounter Date: 08/19/2019  Check In:      Social History   Tobacco Use  Smoking Status Never Smoker  Smokeless Tobacco Never Used    Goals Met:  Independence with exercise equipment Exercise tolerated well No report of cardiac concerns or symptoms  Goals Unmet:  Not Applicable  Comments: Pt able to follow exercise prescription today without complaint.  Will continue to monitor for progression.    Dr. Emily Filbert is Medical Director for [place] and [facility].

## 2019-08-19 NOTE — Progress Notes (Signed)
Daily Session Note  Patient Details  Name: Jessica [name] MRN: [id number] Date of Birth: 06/25/1951 Referring Provider:     Cardiac Rehab from 05/18/2019 in [facility] Cardiac and Pulmonary Rehab  Referring Provider  Quay Burow MD      Encounter Date: 08/19/2019  Check In: Session Check In - 08/19/19 0923      Check-In   Supervising physician immediately available to respond to emergencies  See telemetry face sheet for immediately available ER MD    Location  [facility]-Cardiac & Pulmonary Rehab    Staff Present  Heath Lark, RN, BSN, CCRP;Jeanna Durrell BS, Exercise Physiologist;Joseph Hood RCP,RRT,BSRT    Virtual Visit  No    Medication changes reported      No    Fall or balance concerns reported     No    Warm-up and Cool-down  Performed on first and last piece of equipment    Resistance Training Performed  Yes    VAD Patient?  No    PAD/SET Patient?  No      Pain Assessment   Currently in Pain?  No/denies          Social History   Tobacco Use  Smoking Status Never Smoker  Smokeless Tobacco Never Used    Goals Met:  Independence with exercise equipment Exercise tolerated well No report of cardiac concerns or symptoms  Goals Unmet:  Not Applicable  Comments: Pt able to follow exercise prescription today without complaint.  Will continue to monitor for progression.    Dr. Emily Filbert is Medical Director for [place] and [facility].

## 2019-08-21 ENCOUNTER — Encounter: Payer: Medicare Other | Admitting: *Deleted

## 2019-08-21 ENCOUNTER — Other Ambulatory Visit: Payer: Self-pay

## 2019-08-21 DIAGNOSIS — Z951 Presence of aortocoronary bypass graft: Secondary | ICD-10-CM

## 2019-08-21 NOTE — Progress Notes (Signed)
Daily Session Note  Patient Details  Name: Jessica [name] MRN: [id number] Date of Birth: Nov 28, 1950 Referring Provider:     Cardiac Rehab from 05/18/2019 in [facility] Cardiac and Pulmonary Rehab  Referring Provider  Quay Burow MD      Encounter Date: 08/21/2019  Check In: Session Check In - 08/21/19 0810      Check-In   Supervising physician immediately available to respond to emergencies  See telemetry face sheet for immediately available ER MD    Location  [facility]-Cardiac & Pulmonary Rehab    Staff Present  Heath Lark, RN, BSN, CCRP;Jessica [place], MA, RCEP, CCRP, [place], [place], ACSM CEP, Exercise Physiologist    Virtual Visit  No    Medication changes reported      No    Fall or balance concerns reported     No    Warm-up and Cool-down  Performed on first and last piece of equipment    Resistance Training Performed  Yes    VAD Patient?  No    PAD/SET Patient?  No      Pain Assessment   Currently in Pain?  No/denies          Social History   Tobacco Use  Smoking Status Never Smoker  Smokeless Tobacco Never Used    Goals Met:  Independence with exercise equipment Exercise tolerated well No report of cardiac concerns or symptoms  Goals Unmet:  Not Applicable  Comments: Pt able to follow exercise prescription today without complaint.  Will continue to monitor for progression.    Dr. Emily Filbert is Medical Director for [place] and [facility].

## 2019-08-24 ENCOUNTER — Other Ambulatory Visit: Payer: Self-pay

## 2019-08-24 ENCOUNTER — Encounter: Payer: Medicare Other | Attending: Cardiovascular Disease | Admitting: *Deleted

## 2019-08-24 DIAGNOSIS — Z951 Presence of aortocoronary bypass graft: Secondary | ICD-10-CM | POA: Diagnosis not present

## 2019-08-24 NOTE — Progress Notes (Signed)
Daily Session Note  Patient Details  Name: Jessica [name] MRN: [id number] Date of Birth: 11/24/1950 Referring Provider:     Cardiac Rehab from 05/18/2019 in [facility] Cardiac and Pulmonary Rehab  Referring Provider  Berry, Jonathan MD      Encounter Date: 08/24/2019  Check In: Session Check In - 08/24/19 0805      Check-In   Supervising physician immediately available to respond to emergencies  See telemetry face sheet for immediately available ER MD    Location  [facility]-Cardiac & Pulmonary Rehab    Staff Present  Susanne Bice, RN, BSN, CCRP;Kelly Hayes, BS, ACSM CEP, Exercise Physiologist    Virtual Visit  No    Medication changes reported      No    Fall or balance concerns reported     No    Warm-up and Cool-down  Performed on first and last piece of equipment    Resistance Training Performed  Yes    VAD Patient?  No    PAD/SET Patient?  No      Pain Assessment   Currently in Pain?  No/denies          Social History   Tobacco Use  Smoking Status Never Smoker  Smokeless Tobacco Never Used    Goals Met:  Independence with exercise equipment Exercise tolerated well No report of cardiac concerns or symptoms  Goals Unmet:  Not Applicable  Comments: Pt able to follow exercise prescription today without complaint.  Will continue to monitor for progression.    Dr. Mark Miller is Medical Director for [facility] and [facility]. 

## 2019-08-25 ENCOUNTER — Telehealth: Payer: Self-pay | Admitting: Cardiovascular Disease

## 2019-08-25 NOTE — Telephone Encounter (Signed)
New Message  Pt c/o medication issue:  1. Name of Medication: rosuvastatin (CRESTOR) 20 MG tablet  2. How are you currently taking this medication (dosage and times per day)? Not taking anymore  3. Are you having a reaction (difficulty breathing--STAT)? No  4. What is your medication issue? Patient stopped taking due to having muscle aches and pains. Pharmacist from [facility] would like to speak with Dr. Gwenlyn Found about it.

## 2019-08-25 NOTE — Telephone Encounter (Signed)
Left message to call back and ask for triage nurse.

## 2019-08-26 ENCOUNTER — Other Ambulatory Visit: Payer: Self-pay

## 2019-08-26 ENCOUNTER — Encounter: Payer: Self-pay | Admitting: *Deleted

## 2019-08-26 ENCOUNTER — Encounter: Payer: Medicare Other | Admitting: *Deleted

## 2019-08-26 DIAGNOSIS — Z951 Presence of aortocoronary bypass graft: Secondary | ICD-10-CM | POA: Diagnosis not present

## 2019-08-26 NOTE — Patient Instructions (Addendum)
Discharge Patient Instructions  Patient Details  Name: Jessica [name] MRN: [id number] Date of Birth: [birth year] Referring Provider:  Hoyt [name], *   Number of Visits: 35  Reason for Discharge:  Patient reached a stable level of exercise. Patient independent in their exercise. Patient has met program and personal goals.  Smoking History:  Social History   Tobacco Use  Smoking Status Never Smoker  Smokeless Tobacco Never Used    Diagnosis:  S/P CABG x 5  Initial Exercise Prescription: Initial Exercise Prescription - 05/18/19 1200      Date of Initial Exercise RX and Referring Provider   Date  05/18/19    Referring Provider  Jessica Burow MD      Treadmill   MPH  3.3    Grade  2    Minutes  15    METs  4.21      NuStep   Level  3    SPM  80    Minutes  15    METs  3      Recumbant Elliptical   Level  2    RPM  50    Minutes  15    METs  3      Prescription Details   Frequency (times per week)  3    Duration  Progress to 30 minutes of continuous aerobic without signs/symptoms of physical distress      Intensity   THRR 40-80% of Max Heartrate  105-136    Ratings of Perceived Exertion  11-13    Perceived Dyspnea  0-4      Progression   Progression  Continue to progress workloads to maintain intensity without signs/symptoms of physical distress.      Resistance Training   Training Prescription  Yes    Weight  4 lbs    Reps  10-15       Discharge Exercise Prescription (Final Exercise Prescription Changes): Exercise Prescription Changes - 08/20/19 1400      Response to Exercise   Blood Pressure (Admit)  120/68    Blood Pressure (Exercise)  126/78    Blood Pressure (Exit)  98/60    Heart Rate (Admit)  61 bpm    Heart Rate (Exercise)  96 bpm    Heart Rate (Exit)  76 bpm    Rating of Perceived Exertion (Exercise)  13    Symptoms  none    Duration  Continue with 30 min of aerobic exercise without signs/symptoms of physical distress.     Intensity  THRR unchanged      Progression   Progression  Continue to progress workloads to maintain intensity without signs/symptoms of physical distress.    Average METs  3.6      Resistance Training   Training Prescription  Yes    Weight  4 lb    Reps  10-15      Interval Training   Interval Training  No      Treadmill   MPH  3.5    Grade  2    Minutes  15    METs  4.65      NuStep   Level  4    Minutes  15    METs  2.5      Home Exercise Plan   Plans to continue exercise at  Home (comment)   walking, weights   Frequency  Add 2 additional days to program exercise sessions.    Initial Home Exercises Provided  06/01/19  Functional Capacity: 6 Minute Walk    Row Name 05/18/19 1216 08/17/19 0822       6 Minute Walk   Phase  Initial  Discharge    Distance  1770 feet  1800 feet    Distance % Change  -  2 %    Distance Feet Change  -  30 ft    Walk Time  6 minutes  6 minutes    # of Rest Breaks  0  0    MPH  3.35  3.41    METS  3.86  3.78    RPE  7  13    VO2 Peak  13.49  13.24    Symptoms  No  No    Resting HR  73 bpm  67 bpm    Resting BP  136/70  110/72    Resting Oxygen Saturation   99 %  -    Exercise Oxygen Saturation  during 6 min walk  99 %  -    Max Ex. HR  107 bpm  102 bpm    Max Ex. BP  144/76  138/80    2 Minute Post BP  136/70  -       Quality of Life: Quality of Life - 05/15/19 1212      Quality of Life   Select  Quality of Life      Quality of Life Scores   Health/Function Pre  26.6 %    Socioeconomic Pre  26.43 %    Psych/Spiritual Pre  29.14 %    Family Pre  23.4 %    GLOBAL Pre  26.61 %       Personal Goals: Goals established at orientation with interventions provided to work toward goal. Personal Goals and Risk Factors at Admission - 05/18/19 1220      Core Components/Risk Factors/Patient Goals on Admission    Weight Management  Yes;Weight Loss    Intervention  Weight Management: Develop a combined nutrition and  exercise program designed to reach desired caloric intake, while maintaining appropriate intake of nutrient and fiber, sodium and fats, and appropriate energy expenditure required for the weight goal.;Weight Management: Provide education and appropriate resources to help participant work on and attain dietary goals.    Admit Weight  184 lb (83.5 kg)    Goal Weight: Short Term  178 lb (80.7 kg)    Goal Weight: Long Term  170 lb (77.1 kg)    Expected Outcomes  Short Term: Continue to assess and modify interventions until short term weight is achieved;Long Term: Adherence to nutrition and physical activity/exercise program aimed toward attainment of established weight goal;Weight Loss: Understanding of general recommendations for a balanced deficit meal plan, which promotes 1-2 lb weight loss per week and includes a negative energy balance of [phone] kcal/d;Understanding recommendations for meals to include 15-35% energy as protein, 25-35% energy from fat, 35-60% energy from carbohydrates, less than 251m of dietary cholesterol, 20-35 gm of total fiber daily;Understanding of distribution of calorie intake throughout the day with the consumption of 4-5 meals/snacks    Lipids  Yes    Intervention  Provide education and support for participant on nutrition & aerobic/resistive exercise along with prescribed medications to achieve LDL <76m HDL >4049m   Expected Outcomes  Short Term: Participant states understanding of desired cholesterol values and is compliant with medications prescribed. Participant is following exercise prescription and nutrition guidelines.;Long Term: Cholesterol controlled with medications as prescribed, with individualized exercise RX  and with personalized nutrition plan. Value goals: LDL < 67m, HDL > 40 mg.        Personal Goals Discharge: Goals and Risk Factor Review - 08/10/19 0828      Core Components/Risk Factors/Patient Goals Review   Personal Goals Review  Weight  Management/Obesity;Hypertension;Lipids    Review  MJaidyreports taking all medications as prescribed by doctor. She is managing her lipids and blood pressure with meds, exercise, and diet. Her weight had been maintained with small ups and downs.    Expected Outcomes  Short: Continue to work on weight loss.  Long: Continue to monitor risk factors.       Exercise Goals and Review: Exercise Goals    Row Name 04/15/19 1521 05/18/19 1219           Exercise Goals   Increase Physical Activity  Yes  Yes      Intervention  Provide advice, education, support and counseling about physical activity/exercise needs.;Develop an individualized exercise prescription for aerobic and resistive training based on initial evaluation findings, risk stratification, comorbidities and participant's personal goals.  Provide advice, education, support and counseling about physical activity/exercise needs.;Develop an individualized exercise prescription for aerobic and resistive training based on initial evaluation findings, risk stratification, comorbidities and participant's personal goals.      Expected Outcomes  Short Term: Attend rehab on a regular basis to increase amount of physical activity.;Long Term: Add in home exercise to make exercise part of routine and to increase amount of physical activity.;Long Term: Exercising regularly at least 3-5 days a week.  Short Term: Attend rehab on a regular basis to increase amount of physical activity.;Long Term: Add in home exercise to make exercise part of routine and to increase amount of physical activity.;Long Term: Exercising regularly at least 3-5 days a week.      Increase Strength and Stamina  Yes  Yes      Intervention  Provide advice, education, support and counseling about physical activity/exercise needs.;Develop an individualized exercise prescription for aerobic and resistive training based on initial evaluation findings, risk stratification, comorbidities and  participant's personal goals.  Provide advice, education, support and counseling about physical activity/exercise needs.;Develop an individualized exercise prescription for aerobic and resistive training based on initial evaluation findings, risk stratification, comorbidities and participant's personal goals.      Expected Outcomes  Short Term: Increase workloads from initial exercise prescription for resistance, speed, and METs.;Short Term: Perform resistance training exercises routinely during rehab and add in resistance training at home;Long Term: Improve cardiorespiratory fitness, muscular endurance and strength as measured by increased METs and functional capacity (6MWT)  Short Term: Increase workloads from initial exercise prescription for resistance, speed, and METs.;Short Term: Perform resistance training exercises routinely during rehab and add in resistance training at home;Long Term: Improve cardiorespiratory fitness, muscular endurance and strength as measured by increased METs and functional capacity (6MWT)      Able to understand and use rate of perceived exertion (RPE) scale  Yes  Yes      Intervention  Provide education and explanation on how to use RPE scale  Provide education and explanation on how to use RPE scale      Expected Outcomes  Short Term: Able to use RPE daily in rehab to express subjective intensity level;Long Term:  Able to use RPE to guide intensity level when exercising independently  Short Term: Able to use RPE daily in rehab to express subjective intensity level;Long Term:  Able to use RPE to guide  intensity level when exercising independently      Able to understand and use Dyspnea scale  Yes  Yes      Intervention  Provide education and explanation on how to use Dyspnea scale  Provide education and explanation on how to use Dyspnea scale      Expected Outcomes  Short Term: Able to use Dyspnea scale daily in rehab to express subjective sense of shortness of breath during  exertion;Long Term: Able to use Dyspnea scale to guide intensity level when exercising independently  Short Term: Able to use Dyspnea scale daily in rehab to express subjective sense of shortness of breath during exertion;Long Term: Able to use Dyspnea scale to guide intensity level when exercising independently      Knowledge and understanding of Target Heart Rate Range (THRR)  Yes  Yes      Intervention  Provide education and explanation of THRR including how the numbers were predicted and where they are located for reference  Provide education and explanation of THRR including how the numbers were predicted and where they are located for reference      Expected Outcomes  Short Term: Able to state/look up THRR;Short Term: Able to use daily as guideline for intensity in rehab;Long Term: Able to use THRR to govern intensity when exercising independently  Short Term: Able to state/look up THRR;Short Term: Able to use daily as guideline for intensity in rehab;Long Term: Able to use THRR to govern intensity when exercising independently      Able to check pulse independently  Yes  Yes      Intervention  Provide education and demonstration on how to check pulse in carotid and radial arteries.;Review the importance of being able to check your own pulse for safety during independent exercise  Provide education and demonstration on how to check pulse in carotid and radial arteries.;Review the importance of being able to check your own pulse for safety during independent exercise      Expected Outcomes  Short Term: Able to explain why pulse checking is important during independent exercise;Long Term: Able to check pulse independently and accurately  Short Term: Able to explain why pulse checking is important during independent exercise;Long Term: Able to check pulse independently and accurately      Understanding of Exercise Prescription  Yes  Yes      Intervention  Provide education, explanation, and written  materials on patient's individual exercise prescription  Provide education, explanation, and written materials on patient's individual exercise prescription      Expected Outcomes  Short Term: Able to explain program exercise prescription;Long Term: Able to explain home exercise prescription to exercise independently  Short Term: Able to explain program exercise prescription;Long Term: Able to explain home exercise prescription to exercise independently         Exercise Goals Re-Evaluation: Exercise Goals Re-Evaluation    Row Name 05/04/19 4076 05/22/19 0751 05/25/19 1028 06/01/19 0807 06/12/19 1131     Exercise Goal Re-Evaluation   Exercise Goals Review  Increase Physical Activity;Increase Strength and Stamina;Understanding of Exercise Prescription  Understanding of Exercise Prescription;Increase Physical Activity;Increase Strength and Stamina;Able to understand and use rate of perceived exertion (RPE) scale;Knowledge and understanding of Target Heart Rate Range (THRR)  Increase Physical Activity;Increase Strength and Stamina;Understanding of Exercise Prescription  Increase Physical Activity;Increase Strength and Stamina;Understanding of Exercise Prescription  Increase Physical Activity;Increase Strength and Stamina;Able to understand and use rate of perceived exertion (RPE) scale;Knowledge and understanding of Target Heart Rate Range (THRR);Able  to check pulse independently;Understanding of Exercise Prescription   Comments  Krupa is doing well at home.  She has been walking some, but not consistent enough.  She is eager to come into rehab and has scheduled her orientation. She has also been doing some hand weights at home as well.  She has been aiming for 3-4 days a week for about 10-15 minutes each.  Reviewed RPE scale, THR and program prescription with pt today.  Pt voiced understanding and was given a copy of goals to take home.  Omar is off to a good start in rehab. She has completed her first  two full days of exercise.  We will continue to monitor her progression.  Joice is still doing her one extra day a week at home.  She is also doing her weights at home.  Updated home exercise with pt today.  Pt plans to continue to walk at home for exercise.  Reviewed THR, pulse, RPE, sign and symptoms, NTG use, and when to call 911 or MD.  Also discussed weather considerations and indoor options.  Pt voiced understanding.  Adrinne has increased speed and grade on TM.  She is up to 4 lb for strength training.   Expected Outcomes  Short: Complete 6MWT.  Long: Attend rehab consistently  Short: Use RPE daily to regulate intensity. Long: Follow program prescription in THR.  Short: Continue to attend rehab regularly  Long: Continue to follow program prescription.  Short: Continue to attend rehab. Long: Continue to exercise independently.  Short - continue current programs at home and HT Long - improve overall MET level   Row Name 06/15/19 5462 06/24/19 1333 07/06/19 0818 07/10/19 1248 07/21/19 1444     Exercise Goal Re-Evaluation   Exercise Goals Review  Increase Physical Activity;Increase Strength and Stamina;Understanding of Exercise Prescription  Increase Physical Activity;Increase Strength and Stamina;Able to understand and use rate of perceived exertion (RPE) scale;Knowledge and understanding of Target Heart Rate Range (THRR);Able to check pulse independently;Understanding of Exercise Prescription  Increase Physical Activity;Increase Strength and Stamina;Understanding of Exercise Prescription  Increase Physical Activity;Increase Strength and Stamina;Able to understand and use rate of perceived exertion (RPE) scale;Knowledge and understanding of Target Heart Rate Range (THRR);Able to check pulse independently;Understanding of Exercise Prescription  Increase Physical Activity;Increase Strength and Stamina;Understanding of Exercise Prescription   Comments  Addalyn is doing well in rehab.  She has noticed that  when she goes out to walk it just wipes her out.  We talked about it being due to the heat and humidity.  She will try to get up to go out earlier in the day to help.  She has noticed that her strength and stamina are improving overall. She can tell that rehab has really made a difference for her.  Jla missed a day due to back pain.  She has been back and did seated machines.  Shoshannah is doing well in rehab.  She is getting more at home and going on her off days from class!!  She feels that her strength and stamina are continuing to recover!!  Tawsha has increased workload on TM.  She attends consistently and works in correct RPE range  Guilianna continues to do well in rehab.  The treadmill has been bothering her foot.  It sounds as though she may have the beginnings of plantar facisitis and we talked about some exercises to help her relieve this.  She is now up to level 4 on the NuStep and using 4 lbs  weights.  We will talk about adding in intervals.  We will continue to monitor her progress.   Expected Outcomes  Short: Try to get up earlier on off days for exercise.  Long: Continue to improve strength and stamina.  Short - continue to attend consistently Long - improve overall MET levels  Short: Continue to exercise on off days.  Long: Continue to improve strength and stamina.  Short - stay consistent with exercise Long - improve overall MET level  Short: Talk about adding in intervals.  Long; Continue to to improve stamina.   [place] Name 08/03/19 7124 08/10/19 0816           Exercise Goal Re-Evaluation   Exercise Goals Review  Increase Physical Activity;Increase Strength and Stamina;Understanding of Exercise Prescription  Increase Physical Activity;Increase Strength and Stamina;Understanding of Exercise Prescription      Comments  Valencia is doing well in rehab.  She continues to exericse daily with walking.  Her foot is feeling better and she no longer hurts when she walks.  She is hoping the treadmill  will no longer bother her.  Neeka is getting ready to graduate from [employer] and plans to exercise at the [facility] following graduation. Her foot is still feeling good with exercise and she feel like she has made a lot of progress with her execise and strength. Her only complaint is that she still feel weekness in her legs, especially when climbing stairs. She had talked to her doctor about this and she is monitoring it. If this continues her doctor may check blood flow to the legs.      Expected Outcomes  Short: Continue to walk daily and talk about intervals.  Long: Continue to improve stamina.  Short: continue to monitor leg weakness and follow up with doctor about this issue. Long: Continue a heart heathly exercise program upon graduation.         Nutrition & Weight - Outcomes: Pre Biometrics - 05/18/19 1219      Pre Biometrics   Height  5' 6"  (1.676 m)    Weight  184 lb (83.5 kg)    BMI (Calculated)  29.71    Single Leg Stand  22.78 seconds      Post Biometrics - 08/17/19 0824       Post  Biometrics   Height  5' 6"  (1.676 m)    Weight  186 lb 11.2 oz (84.7 kg)    BMI (Calculated)  30.15    Single Leg Stand  30 seconds       Nutrition: Nutrition Therapy & Goals - 04/20/19 1502      Nutrition Therapy   Diet  HH, low Na diet    Drug/Food Interactions  Statins/Certain Fruits   crestor   Protein (specify units)  65g    Fiber  25 grams    Whole Grain Foods  3 servings    Saturated Fats  12 max. grams    Fruits and Vegetables  5 servings/day    Sodium  1.5 grams      Personal Nutrition Goals   Nutrition Goal  ST: LT: maintain wt, increase energy further, manage healthy    Comments  Sometimes won't eat most of the morning. chicken salad tomatoes, zucchini. Will also eat yogurt, yoplait.  Walnuts before bed. When she does eat B she has waffles, toast [place] bacon, boiled egg and grits. 60oz water per day. End of March to April would eat 2 meals/day and now eats 1  big meal.  Since heart event cut out snacks, soft drinks (used to have 2-3/day). 179lbs maintaing wt. discussed [employer], HH eating, Na, Fiber, Fat, added sugar and added salt.      Intervention Plan   Intervention  Prescribe, educate and counsel regarding individualized specific dietary modifications aiming towards targeted core components such as weight, hypertension, lipid management, diabetes, heart failure and other comorbidities.;Nutrition handout(s) given to patient.   Nutrition info given in email   Expected Outcomes  Short Term Goal: Understand basic principles of dietary content, such as calories, fat, sodium, cholesterol and nutrients.;Short Term Goal: A plan has been developed with personal nutrition goals set during dietitian appointment.;Long Term Goal: Adherence to prescribed nutrition plan.       Nutrition Discharge: Nutrition Assessments - 05/15/19 1213      MEDFICTS Scores   Pre Score  17       Education Questionnaire Score: Knowledge Questionnaire Score - 05/15/19 1213      Knowledge Questionnaire Score   Pre Score  17/26   Stress, PAD, Exercise,Nutrition and Tobacco questions missed correxct response       Goals reviewed with patient; copy given to patient.

## 2019-08-26 NOTE — Telephone Encounter (Signed)
Please schedule patient (if agreeable) to next available Lipid OV with pharmacist to discuss alternative therapy.

## 2019-08-26 NOTE — Telephone Encounter (Signed)
Spoke with pt who is ok for 11/5 at 0900 appt with pharmd in lipid clinic

## 2019-08-26 NOTE — Telephone Encounter (Signed)
Incoming call from [place] in scheduling. She states Slivena from [facility] returning call. She states she spoke with pt yesterday and the pt told her that she stopped taking her rosuvastatin 20 mg for 2-4 weeks d/t muscle aches and pains and pt told her that she had not made Dr. Gwenlyn Found aware of this. Pt has not restarted med. Johnella Moloney also stated that pt told her that she was on atorvastatin 80 mg in the past and had the same side effects. informed Silvena that nurse would route message to Dr. Gwenlyn Found and pharmD for recommendations and will update pt. She verbalized understanding

## 2019-08-26 NOTE — Progress Notes (Signed)
Cardiac Individual Treatment Plan  Patient Details  Name: SHIREL MALLIS MRN: [id number] Date of Birth: 09/01/1951 Referring Provider:     Cardiac Rehab from 05/18/2019 in [facility] Cardiac and Pulmonary Rehab  Referring Provider  Quay Burow MD      Initial Encounter Date:    Cardiac Rehab from 05/18/2019 in [facility] Cardiac and Pulmonary Rehab  Date  05/18/19      Visit Diagnosis: S/P CABG x 5  Patient's Home Medications on Admission:  Current Outpatient Medications:  .  Ascorbic Acid (VITAMIN C) 1000 MG tablet, Take 2,000 mg by mouth daily., Disp: , Rfl:  .  aspirin EC 325 MG EC tablet, Take 1 tablet (325 mg total) by mouth daily. (Patient taking differently: Take 325 mg by mouth at bedtime. ), Disp: , Rfl: 0 .  Cholecalciferol (VITAMIN D-3) 125 MCG (5000 UT) TABS, Take 5,000 Units by mouth daily., Disp: , Rfl:  .  Digestive Enzymes (DIGESTIVE ENZYME PO), Take 1 capsule by mouth 2 (two) times daily. , Disp: , Rfl:  .  loratadine (CLARITIN) 10 MG tablet, Take 10 mg by mouth every evening. , Disp: , Rfl:  .  metoprolol tartrate (LOPRESSOR) 25 MG tablet, Take 0.5 tablets (12.5 mg total) by mouth 2 (two) times daily. (Patient taking differently: Take 12.5 mg by mouth daily. ), Disp: 30 tablet, Rfl: 11 .  Multiple Vitamin (MULTIVITAMIN WITH MINERALS) TABS tablet, Take 1 tablet by mouth daily., Disp: , Rfl:  .  rosuvastatin (CRESTOR) 20 MG tablet, Start with 1 tablet three times weekly and increase to 1 tablet daily as tolerated, Disp: 30 tablet, Rfl: 5  Past Medical History: Past Medical History:  Diagnosis Date  . Allergy   . Breast cancer ([place]) 2000  . Cancer [facility])    breast  . Colon polyp   . Coronary artery disease   . GERD (gastroesophageal reflux disease)   . History of blood transfusion   . Hyperlipidemia   . Migraine    history of migrarines, none as an adult  . Personal history of radiation therapy 2000    Tobacco Use: Social History   Tobacco Use  Smoking Status  Never Smoker  Smokeless Tobacco Never Used    Labs: Recent Review Flowsheet Data    Labs for ITP Cardiac and Pulmonary Rehab Latest Ref Rng & Units 01/05/2019 01/05/2019 01/05/2019 03/12/2019 06/16/2019   Cholestrol 0 - 200 mg/dL - - - 113 200   LDLCALC 0 - 99 mg/dL - - - 63 145(H)   HDL >39.00 mg/dL - - - 33(L) 33.20(L)   Trlycerides 0.0 - 149.0 mg/dL - - - 83 109.0   Hemoglobin A1c 4.6 - 6.5 % - - - - 6.4   PHART 7.350 - 7.450 7.380 7.315(L) 7.265(L) - -   PCO2ART 32.0 - 48.0 mmHg 38.0 47.1 49.9(H) - -   HCO3 20.0 - 28.0 mmol/L 22.9 23.9 22.6 - -   TCO2 22 - 32 mmol/L 24 25 24  - -   ACIDBASEDEF 0.0 - 2.0 mmol/L 3.0(H) 2.0 4.0(H) - -   O2SAT % 99.0 97.0 98.0 - -       Exercise Target Goals: Exercise Program Goal: Individual exercise prescription set using results from initial 6 min walk test and THRR while considering  patient's activity barriers and safety.   Exercise Prescription Goal: Initial exercise prescription builds to 30-45 minutes a day of aerobic activity, 2-3 days per week.  Home exercise guidelines will be given to patient during  program as part of exercise prescription that the participant will acknowledge.  Activity Barriers & Risk Stratification: Activity Barriers & Cardiac Risk Stratification - 05/18/19 1217      Activity Barriers & Cardiac Risk Stratification   Activity Barriers  Deconditioning;Muscular Weakness;Incisional Pain   sternal wire removed on 05/11/19   Cardiac Risk Stratification  High       6 Minute Walk: 6 Minute Walk    Row Name 05/18/19 1216 08/17/19 0822       6 Minute Walk   Phase  Initial  Discharge    Distance  1770 feet  1800 feet    Distance % Change  -  2 %    Distance Feet Change  -  30 ft    Walk Time  6 minutes  6 minutes    # of Rest Breaks  0  0    MPH  3.35  3.41    METS  3.86  3.78    RPE  7  13    VO2 Peak  13.49  13.24    Symptoms  No  No    Resting HR  73 bpm  67 bpm    Resting BP  136/70  110/72    Resting Oxygen  Saturation   99 %  -    Exercise Oxygen Saturation  during 6 min walk  99 %  -    Max Ex. HR  107 bpm  102 bpm    Max Ex. BP  144/76  138/80    2 Minute Post BP  136/70  -       Oxygen Initial Assessment:   Oxygen Re-Evaluation:   Oxygen Discharge (Final Oxygen Re-Evaluation):   Initial Exercise Prescription: Initial Exercise Prescription - 05/18/19 1200      Date of Initial Exercise RX and Referring Provider   Date  05/18/19    Referring Provider  Quay Burow MD      Treadmill   MPH  3.3    Grade  2    Minutes  15    METs  4.21      NuStep   Level  3    SPM  80    Minutes  15    METs  3      Recumbant Elliptical   Level  2    RPM  50    Minutes  15    METs  3      Prescription Details   Frequency (times per week)  3    Duration  Progress to 30 minutes of continuous aerobic without signs/symptoms of physical distress      Intensity   THRR 40-80% of Max Heartrate  105-136    Ratings of Perceived Exertion  11-13    Perceived Dyspnea  0-4      Progression   Progression  Continue to progress workloads to maintain intensity without signs/symptoms of physical distress.      Resistance Training   Training Prescription  Yes    Weight  4 lbs    Reps  10-15       Perform Capillary Blood Glucose checks as needed.  Exercise Prescription Changes: Exercise Prescription Changes    Row Name 05/18/19 1200 05/25/19 1000 06/01/19 0800 06/12/19 1100 06/24/19 1300     Response to Exercise   Blood Pressure (Admit)  136/70  128/74  -  122/70  140/80   Blood Pressure (Exercise)  144/76  126/64  -  134/72  140/62   Blood Pressure (Exit)  136/70  124/72  -  108/70  130/80   Heart Rate (Admit)  73 bpm  57 bpm  -  66 bpm  72 bpm   Heart Rate (Exercise)  107 bpm  110 bpm  -  108 bpm  82 bpm   Heart Rate (Exit)  66 bpm  84 bpm  -  73 bpm  66 bpm   Oxygen Saturation (Admit)  99 %  -  -  -  -   Oxygen Saturation (Exercise)  99 %  -  -  -  -   Rating of Perceived  Exertion (Exercise)  7  12  -  12  12   Symptoms  none  none   -  none  none   Comments  walk test results  second full day of exercise  -  -  -   Duration  -  Continue with 30 min of aerobic exercise without signs/symptoms of physical distress.  -  Continue with 30 min of aerobic exercise without signs/symptoms of physical distress.  Continue with 30 min of aerobic exercise without signs/symptoms of physical distress.   Intensity  -  THRR unchanged  -  THRR unchanged  THRR unchanged     Progression   Progression  -  Continue to progress workloads to maintain intensity without signs/symptoms of physical distress.  -  Continue to progress workloads to maintain intensity without signs/symptoms of physical distress.  Continue to progress workloads to maintain intensity without signs/symptoms of physical distress.   Average METs  -  3.52  -  3.67  3.6     Resistance Training   Training Prescription  -  Yes  -  Yes  Yes   Weight  -  4 lbs  -  4 lb  4 lb   Reps  -  10-15  -  10-15  10-15     Interval Training   Interval Training  -  No  -  No  -     Treadmill   MPH  -  3.3  -  3.5  3.5   Grade  -  2  -  2  2   Minutes  -  15  -  15  15   METs  -  4.44  -  4.64  4.64     NuStep   Level  -  3  -  4  4   SPM  -  -  -  80  80   Minutes  -  15  -  15  15   METs  -  2.6  -  2.6  2.4     Recumbant Elliptical   Level  -  -  -  -  2   RPM  -  -  -  -  50   Minutes  -  -  -  -  15   METs  -  -  -  -  1.5     Home Exercise Plan   Plans to continue exercise at  -  -  Home (comment) walking, weights  Home (comment) walking, weights  Home (comment) walking, weights   Frequency  -  -  Add 1 additional day to program exercise sessions.  Add 1 additional day to program exercise sessions.  Add 1 additional day to program exercise sessions.   Initial Home Exercises Provided  -  -  06/01/19  06/01/19  -   Row Name 07/10/19 1200 07/21/19 1400 08/04/19 1100 08/20/19 1400       Response to Exercise    Blood Pressure (Admit)  106/60  128/74  114/66  120/68    Blood Pressure (Exercise)  128/64  136/70  124/70  126/78    Blood Pressure (Exit)  108/60  130/64  118/70  98/60    Heart Rate (Admit)  99 bpm  83 bpm  72 bpm  61 bpm    Heart Rate (Exercise)  108 bpm  106 bpm  93 bpm  96 bpm    Heart Rate (Exit)  66 bpm  80 bpm  68 bpm  76 bpm    Rating of Perceived Exertion (Exercise)  15  13  13  13     Symptoms  none  none  none  none    Duration  Continue with 30 min of aerobic exercise without signs/symptoms of physical distress.  Continue with 30 min of aerobic exercise without signs/symptoms of physical distress.  Continue with 30 min of aerobic exercise without signs/symptoms of physical distress.  Continue with 30 min of aerobic exercise without signs/symptoms of physical distress.    Intensity  THRR unchanged  THRR unchanged  THRR unchanged  THRR unchanged      Progression   Progression  Continue to progress workloads to maintain intensity without signs/symptoms of physical distress.  Continue to progress workloads to maintain intensity without signs/symptoms of physical distress.  Continue to progress workloads to maintain intensity without signs/symptoms of physical distress.  Continue to progress workloads to maintain intensity without signs/symptoms of physical distress.    Average METs  4.65 on TM  2.91  2.35  3.6      Resistance Training   Training Prescription  Yes  Yes  Yes  Yes    Weight  4 lb  4 lbs  4 lbs  4 lb    Reps  10-15  10-15  10-15  10-15      Interval Training   Interval Training  No  No  No  No      Treadmill   MPH  3.5  3.5  -  3.5    Grade  2  0.5  -  2    Minutes  15  15  -  15    METs  4.64  3.92  -  4.65      NuStep   Level  4  4  4  4     SPM  80  -  -  -    Minutes  15  15  15  15     METs  2.9  2.4  2.8  2.5      Recumbant Elliptical   Level  2  2  2   -    RPM  50  -  -  -    Minutes  15  15  15   -    METs  2.1  2.1  2.1  -      Home Exercise Plan    Plans to continue exercise at  Home (comment) walking, weights  Home (comment) walking, weights  Home (comment) walking, weights  Home (comment) walking, weights    Frequency  Add 1 additional day to program exercise sessions.  Add 1 additional day to program exercise sessions.  Add 2 additional days to program exercise sessions.  Add 2 additional days to program exercise sessions.  Initial Home Exercises Provided  -  06/01/19  06/01/19  06/01/19       Exercise Comments: Exercise Comments    Row Name 05/22/19 0750 07/13/19 0909         Exercise Comments  First full day of exercise!  Patient was oriented to gym and equipment including functions, settings, policies, and procedures.  Patient's individual exercise prescription and treatment plan were reviewed.  All starting workloads were established based on the results of the 6 minute walk test done at initial orientation visit.  The plan for exercise progression was also introduced and progression will be customized based on patient's performance and goals.  NoTreadmill today and for a week. Genetta is experiencing increased foot pain and thinks may be from walking on the treadmill. She has a history of foot trouble. She wants to try a week without using the treadmill and reevaluate after the week is over.         Exercise Goals and Review: Exercise Goals    Row Name 04/15/19 1521 05/18/19 1219           Exercise Goals   Increase Physical Activity  Yes  Yes      Intervention  Provide advice, education, support and counseling about physical activity/exercise needs.;Develop an individualized exercise prescription for aerobic and resistive training based on initial evaluation findings, risk stratification, comorbidities and participant's personal goals.  Provide advice, education, support and counseling about physical activity/exercise needs.;Develop an individualized exercise prescription for aerobic and resistive training based on initial  evaluation findings, risk stratification, comorbidities and participant's personal goals.      Expected Outcomes  Short Term: Attend rehab on a regular basis to increase amount of physical activity.;Long Term: Add in home exercise to make exercise part of routine and to increase amount of physical activity.;Long Term: Exercising regularly at least 3-5 days a week.  Short Term: Attend rehab on a regular basis to increase amount of physical activity.;Long Term: Add in home exercise to make exercise part of routine and to increase amount of physical activity.;Long Term: Exercising regularly at least 3-5 days a week.      Increase Strength and Stamina  Yes  Yes      Intervention  Provide advice, education, support and counseling about physical activity/exercise needs.;Develop an individualized exercise prescription for aerobic and resistive training based on initial evaluation findings, risk stratification, comorbidities and participant's personal goals.  Provide advice, education, support and counseling about physical activity/exercise needs.;Develop an individualized exercise prescription for aerobic and resistive training based on initial evaluation findings, risk stratification, comorbidities and participant's personal goals.      Expected Outcomes  Short Term: Increase workloads from initial exercise prescription for resistance, speed, and METs.;Short Term: Perform resistance training exercises routinely during rehab and add in resistance training at home;Long Term: Improve cardiorespiratory fitness, muscular endurance and strength as measured by increased METs and functional capacity (6MWT)  Short Term: Increase workloads from initial exercise prescription for resistance, speed, and METs.;Short Term: Perform resistance training exercises routinely during rehab and add in resistance training at home;Long Term: Improve cardiorespiratory fitness, muscular endurance and strength as measured by increased METs and  functional capacity (6MWT)      Able to understand and use rate of perceived exertion (RPE) scale  Yes  Yes      Intervention  Provide education and explanation on how to use RPE scale  Provide education and explanation on how to use RPE scale  Expected Outcomes  Short Term: Able to use RPE daily in rehab to express subjective intensity level;Long Term:  Able to use RPE to guide intensity level when exercising independently  Short Term: Able to use RPE daily in rehab to express subjective intensity level;Long Term:  Able to use RPE to guide intensity level when exercising independently      Able to understand and use Dyspnea scale  Yes  Yes      Intervention  Provide education and explanation on how to use Dyspnea scale  Provide education and explanation on how to use Dyspnea scale      Expected Outcomes  Short Term: Able to use Dyspnea scale daily in rehab to express subjective sense of shortness of breath during exertion;Long Term: Able to use Dyspnea scale to guide intensity level when exercising independently  Short Term: Able to use Dyspnea scale daily in rehab to express subjective sense of shortness of breath during exertion;Long Term: Able to use Dyspnea scale to guide intensity level when exercising independently      Knowledge and understanding of Target Heart Rate Range (THRR)  Yes  Yes      Intervention  Provide education and explanation of THRR including how the numbers were predicted and where they are located for reference  Provide education and explanation of THRR including how the numbers were predicted and where they are located for reference      Expected Outcomes  Short Term: Able to state/look up THRR;Short Term: Able to use daily as guideline for intensity in rehab;Long Term: Able to use THRR to govern intensity when exercising independently  Short Term: Able to state/look up THRR;Short Term: Able to use daily as guideline for intensity in rehab;Long Term: Able to use THRR to  govern intensity when exercising independently      Able to check pulse independently  Yes  Yes      Intervention  Provide education and demonstration on how to check pulse in carotid and radial arteries.;Review the importance of being able to check your own pulse for safety during independent exercise  Provide education and demonstration on how to check pulse in carotid and radial arteries.;Review the importance of being able to check your own pulse for safety during independent exercise      Expected Outcomes  Short Term: Able to explain why pulse checking is important during independent exercise;Long Term: Able to check pulse independently and accurately  Short Term: Able to explain why pulse checking is important during independent exercise;Long Term: Able to check pulse independently and accurately      Understanding of Exercise Prescription  Yes  Yes      Intervention  Provide education, explanation, and written materials on patient's individual exercise prescription  Provide education, explanation, and written materials on patient's individual exercise prescription      Expected Outcomes  Short Term: Able to explain program exercise prescription;Long Term: Able to explain home exercise prescription to exercise independently  Short Term: Able to explain program exercise prescription;Long Term: Able to explain home exercise prescription to exercise independently         Exercise Goals Re-Evaluation : Exercise Goals Re-Evaluation    Row Name 05/04/19 8676 05/22/19 0751 05/25/19 1028 06/01/19 0807 06/12/19 1131     Exercise Goal Re-Evaluation   Exercise Goals Review  Increase Physical Activity;Increase Strength and Stamina;Understanding of Exercise Prescription  Understanding of Exercise Prescription;Increase Physical Activity;Increase Strength and Stamina;Able to understand and use rate of perceived exertion (RPE) scale;Knowledge  and understanding of Target Heart Rate Range (THRR)  Increase  Physical Activity;Increase Strength and Stamina;Understanding of Exercise Prescription  Increase Physical Activity;Increase Strength and Stamina;Understanding of Exercise Prescription  Increase Physical Activity;Increase Strength and Stamina;Able to understand and use rate of perceived exertion (RPE) scale;Knowledge and understanding of Target Heart Rate Range (THRR);Able to check pulse independently;Understanding of Exercise Prescription   Comments  Lakashia is doing well at home.  She has been walking some, but not consistent enough.  She is eager to come into rehab and has scheduled her orientation. She has also been doing some hand weights at home as well.  She has been aiming for 3-4 days a week for about 10-15 minutes each.  Reviewed RPE scale, THR and program prescription with pt today.  Pt voiced understanding and was given a copy of goals to take home.  Chrys is off to a good start in rehab. She has completed her first two full days of exercise.  We will continue to monitor her progression.  Nayah is still doing her one extra day a week at home.  She is also doing her weights at home.  Updated home exercise with pt today.  Pt plans to continue to walk at home for exercise.  Reviewed THR, pulse, RPE, sign and symptoms, NTG use, and when to call 911 or MD.  Also discussed weather considerations and indoor options.  Pt voiced understanding.  Zeniah has increased speed and grade on TM.  She is up to 4 lb for strength training.   Expected Outcomes  Short: Complete 6MWT.  Long: Attend rehab consistently  Short: Use RPE daily to regulate intensity. Long: Follow program prescription in THR.  Short: Continue to attend rehab regularly  Long: Continue to follow program prescription.  Short: Continue to attend rehab. Long: Continue to exercise independently.  Short - continue current programs at home and HT Long - improve overall MET level   Row Name 06/15/19 2355 06/24/19 1333 07/06/19 0818 07/10/19 1248  07/21/19 1444     Exercise Goal Re-Evaluation   Exercise Goals Review  Increase Physical Activity;Increase Strength and Stamina;Understanding of Exercise Prescription  Increase Physical Activity;Increase Strength and Stamina;Able to understand and use rate of perceived exertion (RPE) scale;Knowledge and understanding of Target Heart Rate Range (THRR);Able to check pulse independently;Understanding of Exercise Prescription  Increase Physical Activity;Increase Strength and Stamina;Understanding of Exercise Prescription  Increase Physical Activity;Increase Strength and Stamina;Able to understand and use rate of perceived exertion (RPE) scale;Knowledge and understanding of Target Heart Rate Range (THRR);Able to check pulse independently;Understanding of Exercise Prescription  Increase Physical Activity;Increase Strength and Stamina;Understanding of Exercise Prescription   Comments  Mariadel is doing well in rehab.  She has noticed that when she goes out to walk it just wipes her out.  We talked about it being due to the heat and humidity.  She will try to get up to go out earlier in the day to help.  She has noticed that her strength and stamina are improving overall. She can tell that rehab has really made a difference for her.  Concetta missed a day due to back pain.  She has been back and did seated machines.  Anae is doing well in rehab.  She is getting more at home and going on her off days from class!!  She feels that her strength and stamina are continuing to recover!!  Keanu has increased workload on TM.  She attends consistently and works in correct RPE range  D[employer]  continues to do well in rehab.  The treadmill has been bothering her foot.  It sounds as though she may have the beginnings of plantar facisitis and we talked about some exercises to help her relieve this.  She is now up to level 4 on the NuStep and using 4 lbs weights.  We will talk about adding in intervals.  We will continue to monitor  her progress.   Expected Outcomes  Short: Try to get up earlier on off days for exercise.  Long: Continue to improve strength and stamina.  Short - continue to attend consistently Long - improve overall MET levels  Short: Continue to exercise on off days.  Long: Continue to improve strength and stamina.  Short - stay consistent with exercise Long - improve overall MET level  Short: Talk about adding in intervals.  Long; Continue to to improve stamina.   [place] Name 08/03/19 9476 08/10/19 0816           Exercise Goal Re-Evaluation   Exercise Goals Review  Increase Physical Activity;Increase Strength and Stamina;Understanding of Exercise Prescription  Increase Physical Activity;Increase Strength and Stamina;Understanding of Exercise Prescription      Comments  Posey is doing well in rehab.  She continues to exericse daily with walking.  Her foot is feeling better and she no longer hurts when she walks.  She is hoping the treadmill will no longer bother her.  Loralee is getting ready to graduate from [employer] and plans to exercise at the [facility] following graduation. Her foot is still feeling good with exercise and she feel like she has made a lot of progress with her execise and strength. Her only complaint is that she still feel weekness in her legs, especially when climbing stairs. She had talked to her doctor about this and she is monitoring it. If this continues her doctor may check blood flow to the legs.      Expected Outcomes  Short: Continue to walk daily and talk about intervals.  Long: Continue to improve stamina.  Short: continue to monitor leg weakness and follow up with doctor about this issue. Long: Continue a heart heathly exercise program upon graduation.         Discharge Exercise Prescription (Final Exercise Prescription Changes): Exercise Prescription Changes - 08/20/19 1400      Response to Exercise   Blood Pressure (Admit)  120/68    Blood Pressure (Exercise)  126/78     Blood Pressure (Exit)  98/60    Heart Rate (Admit)  61 bpm    Heart Rate (Exercise)  96 bpm    Heart Rate (Exit)  76 bpm    Rating of Perceived Exertion (Exercise)  13    Symptoms  none    Duration  Continue with 30 min of aerobic exercise without signs/symptoms of physical distress.    Intensity  THRR unchanged      Progression   Progression  Continue to progress workloads to maintain intensity without signs/symptoms of physical distress.    Average METs  3.6      Resistance Training   Training Prescription  Yes    Weight  4 lb    Reps  10-15      Interval Training   Interval Training  No      Treadmill   MPH  3.5    Grade  2    Minutes  15    METs  4.65      NuStep   Level  4    Minutes  15    METs  2.5      Home Exercise Plan   Plans to continue exercise at  Home (comment)   walking, weights   Frequency  Add 2 additional days to program exercise sessions.    Initial Home Exercises Provided  06/01/19       Nutrition:  Target Goals: Understanding of nutrition guidelines, daily intake of sodium <1527m, cholesterol <2082m calories 30% from fat and 7% or less from saturated fats, daily to have 5 or more servings of fruits and vegetables.  Biometrics: Pre Biometrics - 05/18/19 1219      Pre Biometrics   Height  5' 6"  (1.676 m)    Weight  184 lb (83.5 kg)    BMI (Calculated)  29.71    Single Leg Stand  22.78 seconds      Post Biometrics - 08/17/19 0824       Post  Biometrics   Height  5' 6"  (1.676 m)    Weight  186 lb 11.2 oz (84.7 kg)    BMI (Calculated)  30.15    Single Leg Stand  30 seconds       Nutrition Therapy Plan and Nutrition Goals: Nutrition Therapy & Goals - 04/20/19 1502      Nutrition Therapy   Diet  HH, low Na diet    Drug/Food Interactions  Statins/Certain Fruits   crestor   Protein (specify units)  65g    Fiber  25 grams    Whole Grain Foods  3 servings    Saturated Fats  12 max. grams    Fruits and Vegetables  5 servings/day     Sodium  1.5 grams      Personal Nutrition Goals   Nutrition Goal  ST: LT: maintain wt, increase energy further, manage healthy    Comments  Sometimes won't eat most of the morning. chicken salad tomatoes, zucchini. Will also eat yogurt, yoplait.  Walnuts before bed. When she does eat B she has waffles, toast tu[place]acon, boiled egg and grits. 60oz water per day. End of March to April would eat 2 meals/day and now eats 1 big meal. Since heart event cut out snacks, soft drinks (used to have 2-3/day). 179lbs maintaing wt. discussed [employer], HH eating, Na, Fiber, Fat, added sugar and added salt.      Intervention Plan   Intervention  Prescribe, educate and counsel regarding individualized specific dietary modifications aiming towards targeted core components such as weight, hypertension, lipid management, diabetes, heart failure and other comorbidities.;Nutrition handout(s) given to patient.   Nutrition info given in email   Expected Outcomes  Short Term Goal: Understand basic principles of dietary content, such as calories, fat, sodium, cholesterol and nutrients.;Short Term Goal: A plan has been developed with personal nutrition goals set during dietitian appointment.;Long Term Goal: Adherence to prescribed nutrition plan.       Nutrition Assessments: Nutrition Assessments - 05/15/19 1213      MEDFICTS Scores   Pre Score  17       Nutrition Goals Re-Evaluation: Nutrition Goals Re-Evaluation    Row Name 05/29/19 0822 06/17/19 0827 07/06/19 0837 08/24/19 1018       Goals   Current Weight  184 lb 11.2 oz (83.8 kg)  188 lb 1.6 oz (85.3 kg)  186 lb 6.4 oz (84.6 kg)  -    Nutrition Goal  ST: continue to limit added salt and sugar LT: maintain wt, increase energy further, manage  healthy  ST: try more variety LT: maintain wt, increase energy further, manage healthy  ST: try more variety and add flexibility to diet LT: maintain wt, increase energy further, manage healthy  ST: try more variety and  add flexibility to diet LT: maintain wt, increase energy further, manage healthy    Comment  Pt reports not wanting to make specific goals, but she is currently working on limiting her Na and added sugar intake; she used to drink 2-3 sodas per day and is now at 1/week. Pt not counting her daily sodium, but is choosing low sodium options. Pt is working on label reading. Encouraged pt that she is making progress and doing well. Told pt that if she needed any resources or help to let me know.  Pt reports using her air fryer and limitng Na and added sugar in diet as well as incorporating lean proteins, fruits and vegetables. Pt reports slow progress, encouraged pt that it is a process and slow changes make for lasting changes. pt reports needing more variety but doesn't follow recipes, told her that she could utilize recipes as a source of ideas and gave her some resources for [facility] recipes as well as some healthy swaps. Pt reports still having only one meal/day. Pt asked about protein shakes, discussed how it is not a replacement for a meal but could be a good supplement to her diet, especially if she is only having one meal; discussed how it can be hard to meet needs with only one meal.  Pt reports finding less pleasure in food and will sometimes opt not to eat or have some peanut butter and crackers instead of a meal. Pt reports still eating one meal a day. Discussed ways to add more variety and how to make food more exciting by thinking outside of the box (burrito bowls, chili, tacos, for example) - pt seemed open to those ideas. told pt to let RD know if she needs any more resources or if she needs ideas for specific ingredients. Discussed how even choosing less health optimal choices in order to make sure she gets enough calories and protein in a day. Discussed ways to enhance her steamed vegetables like choosing to roast vegetable with olive oil and spices to increase healthy fats, kcal, and emotional satiety.   Continue with current changes.    Expected Outcome  ST: continue to limit added salt and sugar LT: maintain wt, increase energy further, manage healthy  Increase variety and increase sustainability  Increase variety and increase sustainability  Increase variety and increase sustainability       Nutrition Goals Discharge (Final Nutrition Goals Re-Evaluation): Nutrition Goals Re-Evaluation - 08/24/19 1018      Goals   Nutrition Goal  ST: try more variety and add flexibility to diet LT: maintain wt, increase energy further, manage healthy    Comment  Continue with current changes.    Expected Outcome  Increase variety and increase sustainability       Psychosocial: Target Goals: Acknowledge presence or absence of significant depression and/or stress, maximize coping skills, provide positive support system. Participant is able to verbalize types and ability to use techniques and skills needed for reducing stress and depression.   Initial Review & Psychosocial Screening: Initial Psych Review & Screening - 05/08/19 1108      Initial Review   Current issues with  Current Stress Concerns    Source of Stress Concerns  Chronic Illness    Comments  Has wires from  [facility] that are being removed on 7/20.      Family Dynamics   Good Support System?  Yes   husband     Barriers   Psychosocial barriers to participate in program  There are no identifiable barriers or psychosocial needs.;The patient should benefit from training in stress management and relaxation.      Screening Interventions   Interventions  Encouraged to exercise;Provide feedback about the scores to participant;To provide support and resources with identified psychosocial needs    Expected Outcomes  Short Term goal: Utilizing psychosocial counselor, staff and physician to assist with identification of specific Stressors or current issues interfering with healing process. Setting desired goal for each stressor or current issue  identified.;Long Term Goal: Stressors or current issues are controlled or eliminated.;Short Term goal: Identification and review with participant of any Quality of Life or Depression concerns found by scoring the questionnaire.;Long Term goal: The participant improves quality of Life and PHQ9 Scores as seen by post scores and/or verbalization of changes       Quality of Life Scores:  Quality of Life - 05/15/19 1212      Quality of Life   Select  Quality of Life      Quality of Life Scores   Health/Function Pre  26.6 %    Socioeconomic Pre  26.43 %    Psych/Spiritual Pre  29.14 %    Family Pre  23.4 %    GLOBAL Pre  26.61 %      Scores of 19 and below usually indicate a poorer quality of life in these areas.  A difference of  2-3 points is a clinically meaningful difference.  A difference of 2-3 points in the total score of the Quality of Life Index has been associated with significant improvement in overall quality of life, self-image, physical symptoms, and general health in studies assessing change in quality of life.  PHQ-9: Recent Review Flowsheet Data    Depression screen [facility] 2/9 05/18/2019 12/31/2018 10/03/2017 09/16/2017 07/27/2016   Decreased Interest 1 0 0 0 0   Down, Depressed, Hopeless 0 0 0 0 0   PHQ - 2 Score 1 0 0 0 0   Altered sleeping 0 - 3 - -   Tired, decreased energy 1 - 1 - -   Change in appetite 0 - 0 - -   Feeling bad or failure about yourself  0 - 0 - -   Trouble concentrating 0 - 0 - -   Moving slowly or fidgety/restless 0 - 0 - -   Suicidal thoughts 0 - 0 - -   PHQ-9 Score 2 - 4 - -   Difficult doing work/chores Not difficult at all - Not difficult at all - -     Interpretation of Total Score  Total Score Depression Severity:  1-4 = Minimal depression, 5-9 = Mild depression, 10-14 = Moderate depression, 15-19 = Moderately severe depression, 20-27 = Severe depression   Psychosocial Evaluation and Intervention:   Psychosocial  Re-Evaluation: Psychosocial Re-Evaluation    Row Name 06/15/19 0825 07/06/19 0819 08/03/19 0844 08/10/19 0817       Psychosocial Re-Evaluation   Current issues with  None Identified  Current Sleep Concerns  None Identified  None Identified    Comments  Mali is doing well mentally.  She is feeling good overall.  She does not have any current stress concerns or depression.  She is sleeping well most night unless she has caffiene too close to  bed time.  Jyl Heinz is doing well mentally and doing well in rehab.  She does not have any stressors or concerns.  Her sleep is still a bit of an issue.  Most nights she is able to get at least 6 hrs.  We talked about the importance of sleep hygine and going to bed when sleepy.  Jaselle is doing well mentally.  She does not have any major stressors or concerns.  Her sleep has gotten a lot better.  She has stopped napping during the day and is able to sleep better at night now.  She has also established a bedtime routine and sticks to it.  Munirah reports no new stressors and feel like she is doing well mentally. She is still sleeping well and had continued with her bedtime routine. She stated that she is not eating late a night right before bed, which she feels helps her sleep better. Cloyce is getting ready to graduate and plans to continue to exercise in the [facility] and stick with the heathy lifestyle changes she had made.    Expected Outcomes  Short: Continue to stay positive.  Long: Continue to practice self care.  Short: Review sleep information.  Long: Continue to get better rest  Short: Continue to sleep better.  Long: Continue to stay positive.  Short: Graduate from [employer] and continue to exercise at the [facility]. Long: Maintain heart healthty habbits and sleep routines upon graduation from [facility] program.    Interventions  Encouraged to attend Cardiac Rehabilitation for the exercise  Encouraged to attend Cardiac Rehabilitation for the exercise   Encouraged to attend Cardiac Rehabilitation for the exercise  Encouraged to attend Cardiac Rehabilitation for the exercise    Continue Psychosocial Services   Follow up required by counselor  Follow up required by staff  Follow up required by staff  Follow up required by staff       Psychosocial Discharge (Final Psychosocial Re-Evaluation): Psychosocial Re-Evaluation - 08/10/19 0817      Psychosocial Re-Evaluation   Current issues with  None Identified    Comments  Adella reports no new stressors and feel like she is doing well mentally. She is still sleeping well and had continued with her bedtime routine. She stated that she is not eating late a night right before bed, which she feels helps her sleep better. Davette is getting ready to graduate and plans to continue to exercise in the [facility] and stick with the heathy lifestyle changes she had made.    Expected Outcomes  Short: Graduate from [facility] and continue to exercise at the [facility] Long: Maintain heart healthty habbits and sleep routines upon graduation from [facility] program.    Interventions  Encouraged to attend Cardiac Rehabilitation for the exercise    Continue Psychosocial Services   Follow up required by staff       Vocational Rehabilitation: Provide vocational rehab assistance to qualifying candidates.   Vocational Rehab Evaluation & Intervention: Vocational Rehab - 05/08/19 1112      Initial Vocational Rehab Evaluation & Intervention   Assessment shows need for Vocational Rehabilitation  No       Education: Education Goals: Education classes will be provided on a variety of topics geared toward better understanding of heart health and risk factor modification. Participant will state understanding/return demonstration of topics presented as noted by education test scores.  Learning Barriers/Preferences:   Education Topics:  AED/CPR: - Group verbal and written instruction with the use of models  to  demonstrate the basic use of the AED with the basic ABC's of resuscitation.   General Nutrition Guidelines/Fats and Fiber: -Group instruction provided by verbal, written material, models and posters to present the general guidelines for heart healthy nutrition. Gives an explanation and review of dietary fats and fiber.   Controlling Sodium/Reading Food Labels: -Group verbal and written material supporting the discussion of sodium use in heart healthy nutrition. Review and explanation with models, verbal and written materials for utilization of the food label.   Exercise Physiology & General Exercise Guidelines: - Group verbal and written instruction with models to review the exercise physiology of the cardiovascular system and associated critical values. Provides general exercise guidelines with specific guidelines to those with heart or lung disease.    Aerobic Exercise & Resistance Training: - Gives group verbal and written instruction on the various components of exercise. Focuses on aerobic and resistive training programs and the benefits of this training and how to safely progress through these programs..   Cardiac Rehab from 08/12/2019 in [facility] Cardiac and Pulmonary Rehab  Date  08/12/19  Educator  AS  Instruction Review Code  1- Verbalizes Understanding      Flexibility, Balance, Mind/Body Relaxation: Provides group verbal/written instruction on the benefits of flexibility and balance training, including mind/body exercise modes such as yoga, pilates and tai chi.  Demonstration and skill practice provided.   Stress and Anxiety: - Provides group verbal and written instruction about the health risks of elevated stress and causes of high stress.  Discuss the correlation between heart/lung disease and anxiety and treatment options. Review healthy ways to manage with stress and anxiety.   Depression: - Provides group verbal and written instruction on the correlation between  heart/lung disease and depressed mood, treatment options, and the stigmas associated with seeking treatment.   Anatomy & Physiology of the Heart: - Group verbal and written instruction and models provide basic cardiac anatomy and physiology, with the coronary electrical and arterial systems. Review of Valvular disease and Heart Failure   Cardiac Procedures: - Group verbal and written instruction to review commonly prescribed medications for heart disease. Reviews the medication, class of the drug, and side effects. Includes the steps to properly store meds and maintain the prescription regimen. (beta blockers and nitrates)   Cardiac Medications I: - Group verbal and written instruction to review commonly prescribed medications for heart disease. Reviews the medication, class of the drug, and side effects. Includes the steps to properly store meds and maintain the prescription regimen.   Cardiac Medications II: -Group verbal and written instruction to review commonly prescribed medications for heart disease. Reviews the medication, class of the drug, and side effects. (all other drug classes)   Cardiac Rehab from 08/12/2019 in [facility] Cardiac and Pulmonary Rehab  Date  08/12/19  Educator  SB  Instruction Review Code  1- Verbalizes Understanding       Go Sex-Intimacy & Heart Disease, Get SMART - Goal Setting: - Group verbal and written instruction through game format to discuss heart disease and the return to sexual intimacy. Provides group verbal and written material to discuss and apply goal setting through the application of the S.M.A.R.T. Method.   Other Matters of the Heart: - Provides group verbal, written materials and models to describe Stable Angina and Peripheral Artery. Includes description of the disease process and treatment options available to the cardiac patient.   Exercise & Equipment Safety: - Individual verbal instruction and demonstration of equipment use and safety  with use of the equipment.   Cardiac Rehab from 08/12/2019 in [facility] Cardiac and Pulmonary Rehab  Date  05/18/19  Educator  [facility]  Instruction Review Code  1- Verbalizes Understanding      Infection Prevention: - Provides verbal and written material to individual with discussion of infection control including proper hand washing and proper equipment cleaning during exercise session.   Cardiac Rehab from 08/12/2019 in [facility] Cardiac and Pulmonary Rehab  Date  05/18/19  Educator  [facility]  Instruction Review Code  1- Verbalizes Understanding      Falls Prevention: - Provides verbal and written material to individual with discussion of falls prevention and safety.   Cardiac Rehab from 08/12/2019 in [facility] Cardiac and Pulmonary Rehab  Date  05/18/19  Educator  [facility]  Instruction Review Code  1- Verbalizes Understanding      Diabetes: - Individual verbal and written instruction to review signs/symptoms of diabetes, desired ranges of glucose level fasting, after meals and with exercise. Acknowledge that pre and post exercise glucose checks will be done for 3 sessions at entry of program.   Know Your Numbers and Risk Factors: -Group verbal and written instruction about important numbers in your health.  Discussion of what are risk factors and how they play a role in the disease process.  Review of Cholesterol, Blood Pressure, Diabetes, and BMI and the role they play in your overall health.   Cardiac Rehab from 08/12/2019 in [facility] Cardiac and Pulmonary Rehab  Date  08/12/19  Educator  SB  Instruction Review Code  1- Verbalizes Understanding      Sleep Hygiene: -Provides group verbal and written instruction about how sleep can affect your health.  Define sleep hygiene, discuss sleep cycles and impact of sleep habits. Review good sleep hygiene tips.    Other: -Provides group and verbal instruction on various topics (see comments)   Knowledge Questionnaire Score: Knowledge Questionnaire Score -  05/15/19 1213      Knowledge Questionnaire Score   Pre Score  17/26   Stress, PAD, Exercise,Nutrition and Tobacco questions missed correxct response       Core Components/Risk Factors/Patient Goals at Admission: Personal Goals and Risk Factors at Admission - 05/18/19 1220      Core Components/Risk Factors/Patient Goals on Admission    Weight Management  Yes;Weight Loss    Intervention  Weight Management: Develop a combined nutrition and exercise program designed to reach desired caloric intake, while maintaining appropriate intake of nutrient and fiber, sodium and fats, and appropriate energy expenditure required for the weight goal.;Weight Management: Provide education and appropriate resources to help participant work on and attain dietary goals.    Admit Weight  184 lb (83.5 kg)    Goal Weight: Short Term  178 lb (80.7 kg)    Goal Weight: Long Term  170 lb (77.1 kg)    Expected Outcomes  Short Term: Continue to assess and modify interventions until short term weight is achieved;Long Term: Adherence to nutrition and physical activity/exercise program aimed toward attainment of established weight goal;Weight Loss: Understanding of general recommendations for a balanced deficit meal plan, which promotes 1-2 lb weight loss per week and includes a negative energy balance of [phone] kcal/d;Understanding recommendations for meals to include 15-35% energy as protein, 25-35% energy from fat, 35-60% energy from carbohydrates, less than 247m of dietary cholesterol, 20-35 gm of total fiber daily;Understanding of distribution of calorie intake throughout the day with the consumption of 4-5 meals/snacks    Lipids  Yes  Intervention  Provide education and support for participant on nutrition & aerobic/resistive exercise along with prescribed medications to achieve LDL <34m, HDL >453m    Expected Outcomes  Short Term: Participant states understanding of desired cholesterol values and is compliant with  medications prescribed. Participant is following exercise prescription and nutrition guidelines.;Long Term: Cholesterol controlled with medications as prescribed, with individualized exercise RX and with personalized nutrition plan. Value goals: LDL < 7075mHDL > 40 mg.       Core Components/Risk Factors/Patient Goals Review:  Goals and Risk Factor Review    Row Name 05/04/19 0923 06/15/19 0827 07/06/19 0823 08/03/19 0845 08/10/19 [id number]  Core Components/Risk Factors/Patient Goals Review   Personal Goals Review  Weight Management/Obesity;Hypertension  Weight Management/Obesity;Hypertension  Weight Management/Obesity;Hypertension  Weight Management/Obesity;Hypertension;Lipids  Weight Management/Obesity;Hypertension;Lipids   Review  Overall she is doing well.  Her weight has stayed steady and her blood pressures have been doing well.  She does have a follow up scheduled with the surgeon on Wednesday of this week for increased incisional soreness and right sided tightness in her chest.  She is 4 month post surgery so within the normal window for pain, but she does have a chest xray scheduled for that day to make sure nothing else is going on.  She will keep us Koreasted.  MarTimaya doing well in rehab. She is not losing weight like she wants.  So we talked abour walking longer at home and adding in intervals in rehab.  Her blood pressures have been good and she checks them daily.  Her next follow up is with her primary tomorrow.  MarTonita doing well in rehab.  Her weight has been steady which is frustrating her.  She continues to walk more and using intervals and workload increases in rehab.  Her pressures continue to do well in rehab.  She did well at her f/u appt but was deficient in magnesium which she is now taking.  MarVeeda doing well in rehab. Her weight was up just a little after being out for a week.  She is walking more at home to help bring it down.  Her blood pressures have been good and she  continues to check them.  She did a call from her doctor about her lipids being higher so they upped her medications.  MarAdyaports taking all medications as prescribed by doctor. She is managing her lipids and blood pressure with meds, exercise, and diet. Her weight had been maintained with small ups and downs.   Expected Outcomes  Short: Talk to doctor about chest/incisional pain.  Long: Continue to monitor risk factors  Short: Talk to doctor tomorrow.  Long: Continue to work on weight loss.  Short: Continue to work on weight loss.  Long: Continue to monitor risk factors.  Short: Continue to work on weight loss.  Long: Continue to monitor risk factors.  Short: Continue to work on weight loss.  Long: Continue to monitor risk factors.      Core Components/Risk Factors/Patient Goals at Discharge (Final Review):  Goals and Risk Factor Review - 08/10/19 0828      Core Components/Risk Factors/Patient Goals Review   Personal Goals Review  Weight Management/Obesity;Hypertension;Lipids    Review  MarLeatherports taking all medications as prescribed by doctor. She is managing her lipids and blood pressure with meds, exercise, and diet. Her weight had been maintained with small ups and downs.    Expected Outcomes  Short: Continue to work  on weight loss.  Long: Continue to monitor risk factors.       ITP Comments: ITP Comments    Row Name 04/14/19 1656 04/15/19 1519 05/04/19 0918 05/08/19 1119 05/18/19 1215   ITP Comments  Initial [facility]iac [facility] visit completed Intake and Consent completed Appts for followup done with EP and RD  Completed initial ExRx created and sent to Dr. Emily Filbert, Medical Director to review and sign.  Completed virtual visit. Scheduled to orient on 7/17 and 7/21.  Orientation via phone completed today   Gym orientation with  EP/RD Eval is scheduled for 7/27 at 11 AM.  Amberia is having surgery to remove the sternal wires on Mon 7/20.  Completed 6MWT and gym orientation  today.  Documentation for diagnosis can be found in [facility] encounter 01/05/19.  Initial ITP created and sent for review to Dr. Emily Filbert, Medical Director.   [place] Name 06/03/19 [phone] 07/01/19 0616 07/13/19 0909 07/29/19 1048 08/26/19 0639   ITP Comments  30 Day Review Completed today. Continue with ITP unless changed by Medical Director review.  New to program  30 Day review. Continue with ITP unless directed changes per Medical Director review.  NoTreadmill today and for a week. Dalphine is experiencing increased foot pain and thinks may be from walking on the treadmill. She has a history of foot trouble. She wants to try a week without using the treadmill and reevaluate after the week is over.  30 day review completed. ITP sent to Dr. Emily Filbert, Medical Director of Cardiac and Pulmonary Rehab. Continue with ITP unless changes are made by physician.  Department closed starting 10/2 until further notice by infection prevention and Health at Work teams for COVID-19.  30 day review completed. Continue with ITP sent to Dr. Emily Filbert, Medical Director of Cardiac and Pulmonary Rehab for review , changes as needed and signature.      Comments:

## 2019-08-26 NOTE — Progress Notes (Signed)
Daily Session Note  Patient Details  Name: DELYNN OLVERA MRN: [id number] Date of Birth: 12-26-1950 Referring Provider:     Cardiac Rehab from 05/18/2019 in [facility]  Referring Provider  Quay Burow MD      Encounter Date: 08/26/2019  Check In: Session Check In - 08/26/19 0800      Check-In   Supervising physician immediately available to respond to emergencies  See telemetry face sheet for immediately available ER MD    Location  [facility]-Cardiac & Pulmonary Rehab    Staff Present  Darel Hong, RN BSN;Joseph Hood RCP,RRT,BSRT    Virtual Visit  No    Medication changes reported      No    Fall or balance concerns reported     No    Warm-up and Cool-down  Performed on first and last piece of equipment    Resistance Training Performed  Yes    VAD Patient?  No    PAD/SET Patient?  No      Pain Assessment   Currently in Pain?  No/denies    Multiple Pain Sites  No          Social History   Tobacco Use  Smoking Status Never Smoker  Smokeless Tobacco Never Used    Goals Met:  Independence with exercise equipment Exercise tolerated well No report of cardiac concerns or symptoms Strength training completed today  Goals Unmet:  Not Applicable  Comments: Pt able to follow exercise prescription today without complaint.  Will continue to monitor for progression.    Dr. Emily Filbert is Medical Director for [place] and [facility].

## 2019-08-26 NOTE — Telephone Encounter (Signed)
Attempted call to [facility], pharmacist with [facility], at number and extension provided. Unable to reach or LVM. Attempted call just to number provided. No answer. LVM regarding f/u on call to [facility]

## 2019-08-27 ENCOUNTER — Other Ambulatory Visit: Payer: Self-pay

## 2019-08-27 ENCOUNTER — Ambulatory Visit (INDEPENDENT_AMBULATORY_CARE_PROVIDER_SITE_OTHER): Payer: Medicare Other | Admitting: Pharmacist

## 2019-08-27 VITALS — BP 128/78 | HR 56 | Resp 16 | Ht 66.5 in | Wt 190.0 lb

## 2019-08-27 DIAGNOSIS — T466X5A Adverse effect of antihyperlipidemic and antiarteriosclerotic drugs, initial encounter: Secondary | ICD-10-CM | POA: Diagnosis not present

## 2019-08-27 DIAGNOSIS — E782 Mixed hyperlipidemia: Secondary | ICD-10-CM

## 2019-08-27 DIAGNOSIS — G720 Drug-induced myopathy: Secondary | ICD-10-CM

## 2019-08-27 MED ORDER — REPATHA SURECLICK 140 MG/ML ~~LOC~~ SOAJ: 140.0000 mg | Freq: Once | SUBCUTANEOUS | 0 refills | Status: AC

## 2019-08-27 NOTE — Progress Notes (Signed)
Patient ID: Jessica [name]                 DOB: 03-30-1951                    MRN: [id number]     HPI:  Jessica [name] is a 68 y.o. female patient referred to lipid clinic by Dr Gwenlyn Found . PMH is significant for hypertension, hyperlipidemia s/p CABG 01/05/2019, migraines, pre-diabetes, and hx of breast cancer. Noted history of satin intolerance with failed trial with atorvastatin, simvastatin, and rosuvastatin. Patient presents to discuss cholesterol management options and potential PCSK9i initiation.   Current Medications: none  Intolerances:  atorvastatin 80mg  daily Simvastatin 20mg  daily rosuvastatin 20mg  3x/weekly  LDL goal: 70mg /mL or 50% decrease from baseline  Diet: "balanced" , working on low fat diet  Exercise: cardia rehab  Family History: The patient's family history includes Asthma in her paternal aunt; Diabetes in her paternal aunt; Heart disease in her brother; Kidney disease in her paternal uncle; Stroke in her paternal uncle.  Social History: denies tobacco use or alcohol use  Labs: 06/16/2019: CHO 200, TG 109, HDL 33, LDL-c 145   Past Medical History:  Diagnosis Date  . Allergy   . Breast cancer ([place]) 2000  . Cancer [facility])    breast  . Colon polyp   . Coronary artery disease   . GERD (gastroesophageal reflux disease)   . History of blood transfusion   . Hyperlipidemia   . Migraine    history of migrarines, none as an adult  . Personal history of radiation therapy 2000    Current Outpatient Medications on File Prior to Visit  Medication Sig Dispense Refill  . Ascorbic Acid (VITAMIN C) 1000 MG tablet Take 2,000 mg by mouth daily.    [name] aspirin EC 325 MG EC tablet Take 1 tablet (325 mg total) by mouth daily. (Patient taking differently: Take 325 mg by mouth at bedtime. )  0  . Cholecalciferol (VITAMIN D-3) 125 MCG (5000 UT) TABS Take 5,000 Units by mouth daily.    [name] loratadine (CLARITIN) 10 MG tablet Take 10 mg by mouth every evening.     . metoprolol tartrate  (LOPRESSOR) 25 MG tablet Take 0.5 tablets (12.5 mg total) by mouth 2 (two) times daily. (Patient taking differently: Take 12.5 mg by mouth daily. ) 30 tablet 11  . Multiple Vitamin (MULTIVITAMIN WITH MINERALS) TABS tablet Take 1 tablet by mouth daily.    . Digestive Enzymes (DIGESTIVE ENZYME PO) Take 1 capsule by mouth 2 (two) times daily.     . rosuvastatin (CRESTOR) 20 MG tablet Start with 1 tablet three times weekly and increase to 1 tablet daily as tolerated (Patient not taking: Reported on 08/27/2019) 30 tablet 5   No current facility-administered medications on file prior to visit.     Allergies  Allergen Reactions  . Codeine Nausea And Vomiting  . Sulfa Antibiotics Swelling  . Sulfacetamide Swelling    Hyperlipidemia LDL remains above goal for secondary prevention for patient with hx of longstanding hypercholesterolemia, and recent CABGx5. Patient is also intolerant to atorvastatin, simvastatin and low dose rosuvastatin.  We discussed indication, storage, administration, monitoring , prior-authorization process, and patient assistance available for PACK9i (Repatha and Praluent).   Patient was somehow reluctant to initiate injectable therapy, but after providing a sample of Repatha SureClick 140mg  for self-administration, patient expressed satisfaction with process and will like to move forward with home PCKS9i therapy.   We will  start process for Praleunt/Repatha prior-authorization, and follow up with patient as needed.     Kyden Potash Rodriguez-Guzman PharmD, BCPS, [place] [phone] 08/30/2019 3:14 PM

## 2019-08-27 NOTE — Patient Instructions (Addendum)
Lipid Clinic (pharmacy) Jessica [name] [phone]  *START Repatha Sureclick  OR Praluent every 14 days* *REPEAT fasting blood work in 6-8 weeks*    High Cholesterol  High cholesterol is a condition in which the blood has high levels of a white, waxy, fat-like substance (cholesterol). The human body needs small amounts of cholesterol. The liver makes all the cholesterol that the body needs. Extra (excess) cholesterol comes from the food that we eat. Cholesterol is carried from the liver by the blood through the blood vessels. If you have high cholesterol, deposits (plaques) may build up on the walls of your blood vessels (arteries). Plaques make the arteries narrower and stiffer. Cholesterol plaques increase your risk for heart attack and stroke. Work with your health care provider to keep your cholesterol levels in a healthy range. What increases the risk? This condition is more likely to develop in people who:  Eat foods that are high in animal fat (saturated fat) or cholesterol.  Are overweight.  Are not getting enough exercise.  Have a family history of high cholesterol. What are the signs or symptoms? There are no symptoms of this condition. How is this diagnosed? This condition may be diagnosed from the results of a blood test.  If you are older than age 66, your health care provider may check your cholesterol every 4-6 years.  You may be checked more often if you already have high cholesterol or other risk factors for heart disease. The blood test for cholesterol measures:  "Bad" cholesterol (LDL cholesterol). This is the main type of cholesterol that causes heart disease. The desired level for LDL is less than 100.  "Good" cholesterol (HDL cholesterol). This type helps to protect against heart disease by cleaning the arteries and carrying the LDL away. The desired level for HDL is 60 or higher.  Triglycerides. These are fats that the body can store or burn for  energy. The desired number for triglycerides is lower than 150.  Total cholesterol. This is a measure of the total amount of cholesterol in your blood, including LDL cholesterol, HDL cholesterol, and triglycerides. A healthy number is less than 200. How is this treated? This condition is treated with diet changes, lifestyle changes, and medicines. Diet changes  This may include eating more whole grains, fruits, vegetables, nuts, and fish.  This may also include cutting back on red meat and foods that have a lot of added sugar. Lifestyle changes  Changes may include getting at least 40 minutes of aerobic exercise 3 times a week. Aerobic exercises include walking, biking, and swimming. Aerobic exercise along with a healthy diet can help you maintain a healthy weight.  Changes may also include quitting smoking. Medicines  Medicines are usually given if diet and lifestyle changes have failed to reduce your cholesterol to healthy levels.  Your health care provider may prescribe a statin medicine. Statin medicines have been shown to reduce cholesterol, which can reduce the risk of heart disease. Follow these instructions at home: Eating and drinking If told by your health care provider:  Eat chicken (without skin), fish, veal, shellfish, ground [place] breast, and round or loin cuts of red meat.  Do not eat fried foods or fatty meats, such as hot dogs and salami.  Eat plenty of fruits, such as apples.  Eat plenty of vegetables, such as broccoli, potatoes, and carrots.  Eat beans, peas, and lentils.  Eat grains such as barley, rice, couscous, and bulgur wheat.  Eat pasta without cream sauces.  Use  skim or nonfat milk, and eat low-fat or nonfat yogurt and cheeses.  Do not eat or drink whole milk, cream, ice cream, egg yolks, or hard cheeses.  Do not eat stick margarine or tub margarines that contain trans fats (also called partially hydrogenated oils).  Do not eat saturated  tropical oils, such as coconut oil and palm oil.  Do not eat cakes, cookies, crackers, or other baked goods that contain trans fats.  General instructions  Exercise as directed by your health care provider. Increase your activity level with activities such as gardening, walking, and taking the stairs.  Take over-the-counter and prescription medicines only as told by your health care provider.  Do not use any products that contain nicotine or tobacco, such as cigarettes and e-cigarettes. If you need help quitting, ask your health care provider.  Keep all follow-up visits as told by your health care provider. This is important. Contact a health care provider if:  You are struggling to maintain a healthy diet or weight.  You need help to start on an exercise program.  You need help to stop smoking. Get help right away if:  You have chest pain.  You have trouble breathing. This information is not intended to replace advice given to you by your health care provider. Make sure you discuss any questions you have with your health care provider. Document Released: 10/08/2005 Document Revised: 10/11/2017 Document Reviewed: 04/07/2016 Elsevier Patient Education  2020 [employer].

## 2019-08-28 ENCOUNTER — Encounter: Payer: Medicare Other | Admitting: *Deleted

## 2019-08-28 DIAGNOSIS — Z951 Presence of aortocoronary bypass graft: Secondary | ICD-10-CM

## 2019-08-28 NOTE — Progress Notes (Signed)
Cardiac Individual Treatment Plan  Patient Details  Name: Jessica [name] MRN: [id number] Date of Birth: 1951-07-01 Referring Provider:     Cardiac Rehab from 05/18/2019 in [facility] Cardiac and Pulmonary Rehab  Referring Provider  Quay Burow MD      Initial Encounter Date:    Cardiac Rehab from 05/18/2019 in [facility] Cardiac and Pulmonary Rehab  Date  05/18/19      Visit Diagnosis: S/P CABG x 5  Patient's Home Medications on Admission:  Current Outpatient Medications:  .  Ascorbic Acid (VITAMIN C) 1000 MG tablet, Take 2,000 mg by mouth daily., Disp: , Rfl:  .  aspirin EC 325 MG EC tablet, Take 1 tablet (325 mg total) by mouth daily. (Patient taking differently: Take 325 mg by mouth at bedtime. ), Disp: , Rfl: 0 .  Cholecalciferol (VITAMIN D-3) 125 MCG (5000 UT) TABS, Take 5,000 Units by mouth daily., Disp: , Rfl:  .  Digestive Enzymes (DIGESTIVE ENZYME PO), Take 1 capsule by mouth 2 (two) times daily. , Disp: , Rfl:  .  loratadine (CLARITIN) 10 MG tablet, Take 10 mg by mouth every evening. , Disp: , Rfl:  .  metoprolol tartrate (LOPRESSOR) 25 MG tablet, Take 0.5 tablets (12.5 mg total) by mouth 2 (two) times daily. (Patient taking differently: Take 12.5 mg by mouth daily. ), Disp: 30 tablet, Rfl: 11 .  Multiple Vitamin (MULTIVITAMIN WITH MINERALS) TABS tablet, Take 1 tablet by mouth daily., Disp: , Rfl:  .  rosuvastatin (CRESTOR) 20 MG tablet, Start with 1 tablet three times weekly and increase to 1 tablet daily as tolerated (Patient not taking: Reported on 08/27/2019), Disp: 30 tablet, Rfl: 5  Past Medical History: Past Medical History:  Diagnosis Date  . Allergy   . Breast cancer ([place]) 2000  . [facility])    breast  . Colon polyp   . Coronary artery disease   . GERD (gastroesophageal reflux disease)   . History of blood transfusion   . Hyperlipidemia   . Migraine    history of migrarines, none as an adult  . Personal history of radiation therapy 2000    Tobacco  Use: Social History   Tobacco Use  Smoking Status Never Smoker  Smokeless Tobacco Never Used    Labs: Recent Review Flowsheet Data    Labs for ITP Cardiac and Pulmonary Rehab Latest Ref Rng & Units 01/05/2019 01/05/2019 01/05/2019 03/12/2019 06/16/2019   Cholestrol 0 - 200 mg/dL - - - 113 200   LDLCALC 0 - 99 mg/dL - - - 63 145(H)   HDL >39.00 mg/dL - - - 33(L) 33.20(L)   Trlycerides 0.0 - 149.0 mg/dL - - - 83 109.0   Hemoglobin A1c 4.6 - 6.5 % - - - - 6.4   PHART 7.350 - 7.450 7.380 7.315(L) 7.265(L) - -   PCO2ART 32.0 - 48.0 mmHg 38.0 47.1 49.9(H) - -   HCO3 20.0 - 28.0 mmol/L 22.9 23.9 22.6 - -   TCO2 22 - 32 mmol/L 24 25 24  - -   ACIDBASEDEF 0.0 - 2.0 mmol/L 3.0(H) 2.0 4.0(H) - -   O2SAT % 99.0 97.0 98.0 - -       Exercise Target Goals: Exercise Program Goal: Individual exercise prescription set using results from initial 6 min walk test and THRR while considering  patient's activity barriers and safety.   Exercise Prescription Goal: Initial exercise prescription builds to 30-45 minutes a day of aerobic activity, 2-3 days per week.  Home exercise guidelines  will be given to patient during program as part of exercise prescription that the participant will acknowledge.  Activity Barriers & Risk Stratification: Activity Barriers & Cardiac Risk Stratification - 05/18/19 1217      Activity Barriers & Cardiac Risk Stratification   Activity Barriers  Deconditioning;Muscular Weakness;Incisional Pain   sternal wire removed on 05/11/19   Cardiac Risk Stratification  High       6 Minute Walk: 6 Minute Walk    Row Name 05/18/19 1216 08/17/19 0822 08/28/19 0823     6 Minute Walk   Phase  Initial  Discharge  Discharge   Distance  1770 feet  1800 feet  1950 feet   Distance % Change  -  2 %  10.7 %   Distance Feet Change  -  30 ft  180 ft   Walk Time  6 minutes  6 minutes  6 minutes   # of Rest Breaks  0  0  0   MPH  3.35  3.41  3.69   METS  3.86  3.78  4.42   RPE  7  13  15     VO2 Peak  13.49  13.24  15.5   Symptoms  No  No  No   Resting HR  73 bpm  67 bpm  73 bpm   Resting BP  136/70  110/72  122/64   Resting Oxygen Saturation   99 %  -  -   Exercise Oxygen Saturation  during 6 min walk  99 %  -  -   Max Ex. HR  107 bpm  102 bpm  134 bpm   Max Ex. BP  144/76  138/80  144/60   2 Minute Post BP  136/70  -  -      Oxygen Initial Assessment:   Oxygen Re-Evaluation:   Oxygen Discharge (Final Oxygen Re-Evaluation):   Initial Exercise Prescription: Initial Exercise Prescription - 05/18/19 1200      Date of Initial Exercise RX and Referring Provider   Date  05/18/19    Referring Provider  Quay Burow MD      Treadmill   MPH  3.3    Grade  2    Minutes  15    METs  4.21      NuStep   Level  3    SPM  80    Minutes  15    METs  3      Recumbant Elliptical   Level  2    RPM  50    Minutes  15    METs  3      Prescription Details   Frequency (times per week)  3    Duration  Progress to 30 minutes of continuous aerobic without signs/symptoms of physical distress      Intensity   THRR 40-80% of Max Heartrate  105-136    Ratings of Perceived Exertion  11-13    Perceived Dyspnea  0-4      Progression   Progression  Continue to progress workloads to maintain intensity without signs/symptoms of physical distress.      Resistance Training   Training Prescription  Yes    Weight  4 lbs    Reps  10-15       Perform Capillary Blood Glucose checks as needed.  Exercise Prescription Changes: Exercise Prescription Changes    Row Name 05/18/19 1200 05/25/19 1000 06/01/19 0800 06/12/19 1100 06/24/19 1300     Response  to Exercise   Blood Pressure (Admit)  136/70  128/74  -  122/70  140/80   Blood Pressure (Exercise)  144/76  126/64  -  134/72  140/62   Blood Pressure (Exit)  136/70  124/72  -  108/70  130/80   Heart Rate (Admit)  73 bpm  57 bpm  -  66 bpm  72 bpm   Heart Rate (Exercise)  107 bpm  110 bpm  -  108 bpm  82 bpm   Heart Rate  (Exit)  66 bpm  84 bpm  -  73 bpm  66 bpm   Oxygen Saturation (Admit)  99 %  -  -  -  -   Oxygen Saturation (Exercise)  99 %  -  -  -  -   Rating of Perceived Exertion (Exercise)  7  12  -  12  12   Symptoms  none  none   -  none  none   Comments  walk test results  second full day of exercise  -  -  -   Duration  -  Continue with 30 min of aerobic exercise without signs/symptoms of physical distress.  -  Continue with 30 min of aerobic exercise without signs/symptoms of physical distress.  Continue with 30 min of aerobic exercise without signs/symptoms of physical distress.   Intensity  -  THRR unchanged  -  THRR unchanged  THRR unchanged     Progression   Progression  -  Continue to progress workloads to maintain intensity without signs/symptoms of physical distress.  -  Continue to progress workloads to maintain intensity without signs/symptoms of physical distress.  Continue to progress workloads to maintain intensity without signs/symptoms of physical distress.   Average METs  -  3.52  -  3.67  3.6     Resistance Training   Training Prescription  -  Yes  -  Yes  Yes   Weight  -  4 lbs  -  4 lb  4 lb   Reps  -  10-15  -  10-15  10-15     Interval Training   Interval Training  -  No  -  No  -     Treadmill   MPH  -  3.3  -  3.5  3.5   Grade  -  2  -  2  2   Minutes  -  15  -  15  15   METs  -  4.44  -  4.64  4.64     NuStep   Level  -  3  -  4  4   SPM  -  -  -  80  80   Minutes  -  15  -  15  15   METs  -  2.6  -  2.6  2.4     Recumbant Elliptical   Level  -  -  -  -  2   RPM  -  -  -  -  50   Minutes  -  -  -  -  15   METs  -  -  -  -  1.5     Home Exercise Plan   Plans to continue exercise at  -  -  Home (comment) walking, weights  Home (comment) walking, weights  Home (comment) walking, weights   Frequency  -  -  Add 1 additional day  to program exercise sessions.  Add 1 additional day to program exercise sessions.  Add 1 additional day to program exercise sessions.    Initial Home Exercises Provided  -  -  06/01/19  06/01/19  -   [place] Name 07/10/19 1200 07/21/19 1400 08/04/19 1100 08/20/19 1400       Response to Exercise   Blood Pressure (Admit)  106/60  128/74  114/66  120/68    Blood Pressure (Exercise)  128/64  136/70  124/70  126/78    Blood Pressure (Exit)  108/60  130/64  118/70  98/60    Heart Rate (Admit)  99 bpm  83 bpm  72 bpm  61 bpm    Heart Rate (Exercise)  108 bpm  106 bpm  93 bpm  96 bpm    Heart Rate (Exit)  66 bpm  80 bpm  68 bpm  76 bpm    Rating of Perceived Exertion (Exercise)  15  13  13  13     Symptoms  none  none  none  none    Duration  Continue with 30 min of aerobic exercise without signs/symptoms of physical distress.  Continue with 30 min of aerobic exercise without signs/symptoms of physical distress.  Continue with 30 min of aerobic exercise without signs/symptoms of physical distress.  Continue with 30 min of aerobic exercise without signs/symptoms of physical distress.    Intensity  THRR unchanged  THRR unchanged  THRR unchanged  THRR unchanged      Progression   Progression  Continue to progress workloads to maintain intensity without signs/symptoms of physical distress.  Continue to progress workloads to maintain intensity without signs/symptoms of physical distress.  Continue to progress workloads to maintain intensity without signs/symptoms of physical distress.  Continue to progress workloads to maintain intensity without signs/symptoms of physical distress.    Average METs  4.65 on TM  2.91  2.35  3.6      Resistance Training   Training Prescription  Yes  Yes  Yes  Yes    Weight  4 lb  4 lbs  4 lbs  4 lb    Reps  10-15  10-15  10-15  10-15      Interval Training   Interval Training  No  No  No  No      Treadmill   MPH  3.5  3.5  -  3.5    Grade  2  0.5  -  2    Minutes  15  15  -  15    METs  4.64  3.92  -  4.65      NuStep   Level  4  4  4  4     SPM  80  -  -  -    Minutes  15  15  15  15     METs  2.9   2.4  2.8  2.5      Recumbant Elliptical   Level  2  2  2   -    RPM  50  -  -  -    Minutes  15  15  15   -    METs  2.1  2.1  2.1  -      Home Exercise Plan   Plans to continue exercise at  Home (comment) walking, weights  Home (comment) walking, weights  Home (comment) walking, weights  Home (comment) walking, weights    Frequency  Add 1  additional day to program exercise sessions.  Add 1 additional day to program exercise sessions.  Add 2 additional days to program exercise sessions.  Add 2 additional days to program exercise sessions.    Initial Home Exercises Provided  -  06/01/19  06/01/19  06/01/19       Exercise Comments: Exercise Comments    Row Name 05/22/19 0750 07/13/19 0909 08/28/19 0816       Exercise Comments  First full day of exercise!  Patient was oriented to gym and equipment including functions, settings, policies, and procedures.  Patient's individual exercise prescription and treatment plan were reviewed.  All starting workloads were established based on the results of the 6 minute walk test done at initial orientation visit.  The plan for exercise progression was also introduced and progression will be customized based on patient's performance and goals.  NoTreadmill today and for a week. Etheline is experiencing increased foot pain and thinks may be from walking on the treadmill. She has a history of foot trouble. She wants to try a week without using the treadmill and reevaluate after the week is over.  Averlee graduated today from  rehab with 35 sessions completed.  Details of the patient's exercise prescription and what He needs to do in order to continue the prescription and progress were discussed with patient.  Patient was given a copy of prescription and goals.  Patient verbalized understanding.  Jeriann plans to continue to exercise by going to the [facility].        Exercise Goals and Review: Exercise Goals    Row Name 04/15/19 1521 05/18/19 1219            Exercise Goals   Increase Physical Activity  Yes  Yes      Intervention  Provide advice, education, support and counseling about physical activity/exercise needs.;Develop an individualized exercise prescription for aerobic and resistive training based on initial evaluation findings, risk stratification, comorbidities and participant's personal goals.  Provide advice, education, support and counseling about physical activity/exercise needs.;Develop an individualized exercise prescription for aerobic and resistive training based on initial evaluation findings, risk stratification, comorbidities and participant's personal goals.      Expected Outcomes  Short Term: Attend rehab on a regular basis to increase amount of physical activity.;Long Term: Add in home exercise to make exercise part of routine and to increase amount of physical activity.;Long Term: Exercising regularly at least 3-5 days a week.  Short Term: Attend rehab on a regular basis to increase amount of physical activity.;Long Term: Add in home exercise to make exercise part of routine and to increase amount of physical activity.;Long Term: Exercising regularly at least 3-5 days a week.      Increase Strength and Stamina  Yes  Yes      Intervention  Provide advice, education, support and counseling about physical activity/exercise needs.;Develop an individualized exercise prescription for aerobic and resistive training based on initial evaluation findings, risk stratification, comorbidities and participant's personal goals.  Provide advice, education, support and counseling about physical activity/exercise needs.;Develop an individualized exercise prescription for aerobic and resistive training based on initial evaluation findings, risk stratification, comorbidities and participant's personal goals.      Expected Outcomes  Short Term: Increase workloads from initial exercise prescription for resistance, speed, and METs.;Short Term: Perform resistance  training exercises routinely during rehab and add in resistance training at home;Long Term: Improve cardiorespiratory fitness, muscular endurance and strength as measured by increased METs and functional capacity (6MWT)  Short  Term: Increase workloads from initial exercise prescription for resistance, speed, and METs.;Short Term: Perform resistance training exercises routinely during rehab and add in resistance training at home;Long Term: Improve cardiorespiratory fitness, muscular endurance and strength as measured by increased METs and functional capacity (6MWT)      Able to understand and use rate of perceived exertion (RPE) scale  Yes  Yes      Intervention  Provide education and explanation on how to use RPE scale  Provide education and explanation on how to use RPE scale      Expected Outcomes  Short Term: Able to use RPE daily in rehab to express subjective intensity level;Long Term:  Able to use RPE to guide intensity level when exercising independently  Short Term: Able to use RPE daily in rehab to express subjective intensity level;Long Term:  Able to use RPE to guide intensity level when exercising independently      Able to understand and use Dyspnea scale  Yes  Yes      Intervention  Provide education and explanation on how to use Dyspnea scale  Provide education and explanation on how to use Dyspnea scale      Expected Outcomes  Short Term: Able to use Dyspnea scale daily in rehab to express subjective sense of shortness of breath during exertion;Long Term: Able to use Dyspnea scale to guide intensity level when exercising independently  Short Term: Able to use Dyspnea scale daily in rehab to express subjective sense of shortness of breath during exertion;Long Term: Able to use Dyspnea scale to guide intensity level when exercising independently      Knowledge and understanding of Target Heart Rate Range (THRR)  Yes  Yes      Intervention  Provide education and explanation of THRR including how  the numbers were predicted and where they are located for reference  Provide education and explanation of THRR including how the numbers were predicted and where they are located for reference      Expected Outcomes  Short Term: Able to state/look up THRR;Short Term: Able to use daily as guideline for intensity in rehab;Long Term: Able to use THRR to govern intensity when exercising independently  Short Term: Able to state/look up THRR;Short Term: Able to use daily as guideline for intensity in rehab;Long Term: Able to use THRR to govern intensity when exercising independently      Able to check pulse independently  Yes  Yes      Intervention  Provide education and demonstration on how to check pulse in carotid and radial arteries.;Review the importance of being able to check your own pulse for safety during independent exercise  Provide education and demonstration on how to check pulse in carotid and radial arteries.;Review the importance of being able to check your own pulse for safety during independent exercise      Expected Outcomes  Short Term: Able to explain why pulse checking is important during independent exercise;Long Term: Able to check pulse independently and accurately  Short Term: Able to explain why pulse checking is important during independent exercise;Long Term: Able to check pulse independently and accurately      Understanding of Exercise Prescription  Yes  Yes      Intervention  Provide education, explanation, and written materials on patient's individual exercise prescription  Provide education, explanation, and written materials on patient's individual exercise prescription      Expected Outcomes  Short Term: Able to explain program exercise prescription;Long Term: Able to explain  home exercise prescription to exercise independently  Short Term: Able to explain program exercise prescription;Long Term: Able to explain home exercise prescription to exercise independently          Exercise Goals Re-Evaluation : Exercise Goals Re-Evaluation    Row Name 05/04/19 0932 05/22/19 0751 05/25/19 1028 06/01/19 0807 06/12/19 1131     Exercise Goal Re-Evaluation   Exercise Goals Review  Increase Physical Activity;Increase Strength and Stamina;Understanding of Exercise Prescription  Understanding of Exercise Prescription;Increase Physical Activity;Increase Strength and Stamina;Able to understand and use rate of perceived exertion (RPE) scale;Knowledge and understanding of Target Heart Rate Range (THRR)  Increase Physical Activity;Increase Strength and Stamina;Understanding of Exercise Prescription  Increase Physical Activity;Increase Strength and Stamina;Understanding of Exercise Prescription  Increase Physical Activity;Increase Strength and Stamina;Able to understand and use rate of perceived exertion (RPE) scale;Knowledge and understanding of Target Heart Rate Range (THRR);Able to check pulse independently;Understanding of Exercise Prescription   Comments  Austynn is doing well at home.  She has been walking some, but not consistent enough.  She is eager to come into rehab and has scheduled her orientation. She has also been doing some hand weights at home as well.  She has been aiming for 3-4 days a week for about 10-15 minutes each.  Reviewed RPE scale, THR and program prescription with pt today.  Pt voiced understanding and was given a copy of goals to take home.  Sihaam is off to a good start in rehab. She has completed her first two full days of exercise.  We will continue to monitor her progression.  Aldora is still doing her one extra day a week at home.  She is also doing her weights at home.  Updated home exercise with pt today.  Pt plans to continue to walk at home for exercise.  Reviewed THR, pulse, RPE, sign and symptoms, NTG use, and when to call 911 or MD.  Also discussed weather considerations and indoor options.  Pt voiced understanding.  Kishana has increased speed and  grade on TM.  She is up to 4 lb for strength training.   Expected Outcomes  Short: Complete 6MWT.  Long: Attend rehab consistently  Short: Use RPE daily to regulate intensity. Long: Follow program prescription in THR.  Short: Continue to attend rehab regularly  Long: Continue to follow program prescription.  Short: Continue to attend rehab. Long: Continue to exercise independently.  Short - continue current programs at home and HT Long - improve overall MET level   Row Name 06/15/19 6712 06/24/19 1333 07/06/19 0818 07/10/19 1248 07/21/19 1444     Exercise Goal Re-Evaluation   Exercise Goals Review  Increase Physical Activity;Increase Strength and Stamina;Understanding of Exercise Prescription  Increase Physical Activity;Increase Strength and Stamina;Able to understand and use rate of perceived exertion (RPE) scale;Knowledge and understanding of Target Heart Rate Range (THRR);Able to check pulse independently;Understanding of Exercise Prescription  Increase Physical Activity;Increase Strength and Stamina;Understanding of Exercise Prescription  Increase Physical Activity;Increase Strength and Stamina;Able to understand and use rate of perceived exertion (RPE) scale;Knowledge and understanding of Target Heart Rate Range (THRR);Able to check pulse independently;Understanding of Exercise Prescription  Increase Physical Activity;Increase Strength and Stamina;Understanding of Exercise Prescription   Comments  Zita is doing well in rehab.  She has noticed that when she goes out to walk it just wipes her out.  We talked about it being due to the heat and humidity.  She will try to get up to go out earlier in the day  to help.  She has noticed that her strength and stamina are improving overall. She can tell that rehab has really made a difference for her.  Austyn missed a day due to back pain.  She has been back and did seated machines.  Chareese is doing well in rehab.  She is getting more at home and going on her  off days from class!!  She feels that her strength and stamina are continuing to recover!!  Delise has increased workload on TM.  She attends consistently and works in correct RPE range  Kamiah continues to do well in rehab.  The treadmill has been bothering her foot.  It sounds as though she may have the beginnings of plantar facisitis and we talked about some exercises to help her relieve this.  She is now up to level 4 on the NuStep and using 4 lbs weights.  We will talk about adding in intervals.  We will continue to monitor her progress.   Expected Outcomes  Short: Try to get up earlier on off days for exercise.  Long: Continue to improve strength and stamina.  Short - continue to attend consistently Long - improve overall MET levels  Short: Continue to exercise on off days.  Long: Continue to improve strength and stamina.  Short - stay consistent with exercise Long - improve overall MET level  Short: Talk about adding in intervals.  Long; Continue to to improve stamina.   [place] Name 08/03/19 5409 08/10/19 0816 08/26/19 1201         Exercise Goal Re-Evaluation   Exercise Goals Review  Increase Physical Activity;Increase Strength and Stamina;Understanding of Exercise Prescription  Increase Physical Activity;Increase Strength and Stamina;Understanding of Exercise Prescription  Increase Physical Activity;Increase Strength and Stamina;Understanding of Exercise Prescription     Comments  Peola is doing well in rehab.  She continues to exericse daily with walking.  Her foot is feeling better and she no longer hurts when she walks.  She is hoping the treadmill will no longer bother her.  Kyran is getting ready to graduate from [employer] and plans to exercise at the [facility] following graduation. Her foot is still feeling good with exercise and she feel like she has made a lot of progress with her execise and strength. Her only complaint is that she still feel weekness in her legs, especially when  climbing stairs. She had talked to her doctor about this and she is monitoring it. If this continues her doctor may check blood flow to the legs.  Paisely will be graduating on Friday.  She is planning to join the [facility] and already has her orientation appointment set up.  She has done well.     Expected Outcomes  Short: Continue to walk daily and talk about intervals.  Long: Continue to improve stamina.  Short: continue to monitor leg weakness and follow up with doctor about this issue. Long: Continue a heart heathly exercise program upon graduation.  Short: Graduate!  Long: Continue to exercise independently.        Discharge Exercise Prescription (Final Exercise Prescription Changes): Exercise Prescription Changes - 08/20/19 1400      Response to Exercise   Blood Pressure (Admit)  120/68    Blood Pressure (Exercise)  126/78    Blood Pressure (Exit)  98/60    Heart Rate (Admit)  61 bpm    Heart Rate (Exercise)  96 bpm    Heart Rate (Exit)  76 bpm    Rating of  Perceived Exertion (Exercise)  13    Symptoms  none    Duration  Continue with 30 min of aerobic exercise without signs/symptoms of physical distress.    Intensity  THRR unchanged      Progression   Progression  Continue to progress workloads to maintain intensity without signs/symptoms of physical distress.    Average METs  3.6      Resistance Training   Training Prescription  Yes    Weight  4 lb    Reps  10-15      Interval Training   Interval Training  No      Treadmill   MPH  3.5    Grade  2    Minutes  15    METs  4.65      NuStep   Level  4    Minutes  15    METs  2.5      Home Exercise Plan   Plans to continue exercise at  Home (comment)   walking, weights   Frequency  Add 2 additional days to program exercise sessions.    Initial Home Exercises Provided  06/01/19       Nutrition:  Target Goals: Understanding of nutrition guidelines, daily intake of sodium <1555m, cholesterol <2059m calories 30%  from fat and 7% or less from saturated fats, daily to have 5 or more servings of fruits and vegetables.  Biometrics: Pre Biometrics - 05/18/19 1219      Pre Biometrics   Height  5' 6"  (1.676 m)    Weight  184 lb (83.5 kg)    BMI (Calculated)  29.71    Single Leg Stand  22.78 seconds      Post Biometrics - 08/17/19 0824       Post  Biometrics   Height  5' 6"  (1.676 m)    Weight  186 lb 11.2 oz (84.7 kg)    BMI (Calculated)  30.15    Single Leg Stand  30 seconds       Nutrition Therapy Plan and Nutrition Goals: Nutrition Therapy & Goals - 04/20/19 1502      Nutrition Therapy   Diet  HH, low Na diet    Drug/Food Interactions  Statins/Certain Fruits   crestor   Protein (specify units)  65g    Fiber  25 grams    Whole Grain Foods  3 servings    Saturated Fats  12 max. grams    Fruits and Vegetables  5 servings/day    Sodium  1.5 grams      Personal Nutrition Goals   Nutrition Goal  ST: LT: maintain wt, increase energy further, manage healthy    Comments  Sometimes won't eat most of the morning. chicken salad tomatoes, zucchini. Will also eat yogurt, yoplait.  Walnuts before bed. When she does eat B she has waffles, toast tu[place]acon, boiled egg and grits. 60oz water per day. End of March to April would eat 2 meals/day and now eats 1 big meal. Since heart event cut out snacks, soft drinks (used to have 2-3/day). 179lbs maintaing wt. discussed [employer], HH eating, Na, Fiber, Fat, added sugar and added salt.      Intervention Plan   Intervention  Prescribe, educate and counsel regarding individualized specific dietary modifications aiming towards targeted core components such as weight, hypertension, lipid management, diabetes, heart failure and other comorbidities.;Nutrition handout(s) given to patient.   Nutrition info given in email   Expected Outcomes  Short Term Goal:  Understand basic principles of dietary content, such as calories, fat, sodium, cholesterol and  nutrients.;Short Term Goal: A plan has been developed with personal nutrition goals set during dietitian appointment.;Long Term Goal: Adherence to prescribed nutrition plan.       Nutrition Assessments: Nutrition Assessments - 08/27/19 0747      MEDFICTS Scores   Pre Score  17    Post Score  15    Score Difference  -2       Nutrition Goals Re-Evaluation: Nutrition Goals Re-Evaluation    Row Name 05/29/19 [phone] 06/17/19 0827 07/06/19 0837 08/24/19 1018       Goals   Current Weight  184 lb 11.2 oz (83.8 kg)  188 lb 1.6 oz (85.3 kg)  186 lb 6.4 oz (84.6 kg)  -    Nutrition Goal  ST: continue to limit added salt and sugar LT: maintain wt, increase energy further, manage healthy  ST: try more variety LT: maintain wt, increase energy further, manage healthy  ST: try more variety and add flexibility to diet LT: maintain wt, increase energy further, manage healthy  ST: try more variety and add flexibility to diet LT: maintain wt, increase energy further, manage healthy    Comment  Pt reports not wanting to make specific goals, but she is currently working on limiting her Na and added sugar intake; she used to drink 2-3 sodas per day and is now at 1/week. Pt not counting her daily sodium, but is choosing low sodium options. Pt is working on label reading. Encouraged pt that she is making progress and doing well. Told pt that if she needed any resources or help to let me know.  Pt reports using her air fryer and limitng Na and added sugar in diet as well as incorporating lean proteins, fruits and vegetables. Pt reports slow progress, encouraged pt that it is a process and slow changes make for lasting changes. pt reports needing more variety but doesn't follow recipes, told her that she could utilize recipes as a source of ideas and gave her some resources for [facility] recipes as well as some healthy swaps. Pt reports still having only one meal/day. Pt asked about protein shakes, discussed how it is not a  replacement for a meal but could be a good supplement to her diet, especially if she is only having one meal; discussed how it can be hard to meet needs with only one meal.  Pt reports finding less pleasure in food and will sometimes opt not to eat or have some peanut butter and crackers instead of a meal. Pt reports still eating one meal a day. Discussed ways to add more variety and how to make food more exciting by thinking outside of the box (burrito bowls, chili, tacos, for example) - pt seemed open to those ideas. told pt to let RD know if she needs any more resources or if she needs ideas for specific ingredients. Discussed how even choosing less health optimal choices in order to make sure she gets enough calories and protein in a day. Discussed ways to enhance her steamed vegetables like choosing to roast vegetable with olive oil and spices to increase healthy fats, kcal, and emotional satiety.  Continue with current changes.    Expected Outcome  ST: continue to limit added salt and sugar LT: maintain wt, increase energy further, manage healthy  Increase variety and increase sustainability  Increase variety and increase sustainability  Increase variety and increase sustainability  Nutrition Goals Discharge (Final Nutrition Goals Re-Evaluation): Nutrition Goals Re-Evaluation - 08/24/19 1018      Goals   Nutrition Goal  ST: try more variety and add flexibility to diet LT: maintain wt, increase energy further, manage healthy    Comment  Continue with current changes.    Expected Outcome  Increase variety and increase sustainability       Psychosocial: Target Goals: Acknowledge presence or absence of significant depression and/or stress, maximize coping skills, provide positive support system. Participant is able to verbalize types and ability to use techniques and skills needed for reducing stress and depression.   Initial Review & Psychosocial Screening: Initial Psych Review & Screening  - 05/08/19 1108      Initial Review   Current issues with  Current Stress Concerns    Source of Stress Concerns  Chronic Illness    Comments  Has wires from CABG that are being removed on 7/20.      Family Dynamics   Good Support System?  Yes   husband     Barriers   Psychosocial barriers to participate in program  There are no identifiable barriers or psychosocial needs.;The patient should benefit from training in stress management and relaxation.      Screening Interventions   Interventions  Encouraged to exercise;Provide feedback about the scores to participant;To provide support and resources with identified psychosocial needs    Expected Outcomes  Short Term goal: Utilizing psychosocial counselor, staff and physician to assist with identification of specific Stressors or current issues interfering with healing process. Setting desired goal for each stressor or current issue identified.;Long Term Goal: Stressors or current issues are controlled or eliminated.;Short Term goal: Identification and review with participant of any Quality of Life or Depression concerns found by scoring the questionnaire.;Long Term goal: The participant improves quality of Life and PHQ9 Scores as seen by post scores and/or verbalization of changes       Quality of Life Scores:  Quality of Life - 08/27/19 0748      Quality of Life Scores   Health/Function Pre  26.6 %    Health/Function Post  26.2 %    Health/Function % Change  -1.5 %    Socioeconomic Pre  26.43 %    Socioeconomic Post  24.56 %    Socioeconomic % Change   -7.08 %    Psych/Spiritual Pre  29.14 %    Psych/Spiritual Post  24.85 %    Psych/Spiritual % Change  -14.72 %    Family Pre  23.4 %    Family Post  24.6 %    Family % Change  5.13 %    GLOBAL Pre  26.61 %    GLOBAL Post  25.32 %    GLOBAL % Change  -4.85 %      Scores of 19 and below usually indicate a poorer quality of life in these areas.  A difference of  2-3 points is a  clinically meaningful difference.  A difference of 2-3 points in the total score of the Quality of Life Index has been associated with significant improvement in overall quality of life, self-image, physical symptoms, and general health in studies assessing change in quality of life.  PHQ-9: Recent Review Flowsheet Data    Depression screen [facility] 2/9 05/18/2019 12/31/2018 10/03/2017 09/16/2017 07/27/2016   Decreased Interest 1 0 0 0 0   Down, Depressed, Hopeless 0 0 0 0 0   PHQ - 2 Score 1 0 0 0  0   Altered sleeping 0 - 3 - -   Tired, decreased energy 1 - 1 - -   Change in appetite 0 - 0 - -   Feeling bad or failure about yourself  0 - 0 - -   Trouble concentrating 0 - 0 - -   Moving slowly or fidgety/restless 0 - 0 - -   Suicidal thoughts 0 - 0 - -   PHQ-9 Score 2 - 4 - -   Difficult doing work/chores Not difficult at all - Not difficult at all - -     Interpretation of Total Score  Total Score Depression Severity:  1-4 = Minimal depression, 5-9 = Mild depression, 10-14 = Moderate depression, 15-19 = Moderately severe depression, 20-27 = Severe depression   Psychosocial Evaluation and Intervention: Psychosocial Evaluation - 08/26/19 1202      Discharge Psychosocial Assessment & Intervention   Comments  Julicia will be graduating this Friday.  It is one day early, but she is eager to finish on Friday versus Monday. She has enjoyed class and getting to know some of her class [name]. She is planning to join the [place] to continue to exercise and build on all that she has already gained.       Psychosocial Re-Evaluation: Psychosocial Re-Evaluation    Row Name 06/15/19 0825 07/06/19 0819 08/03/19 0844 08/10/19 0817       Psychosocial Re-Evaluation   Current issues with  None Identified  Current Sleep Concerns  None Identified  None Identified    Comments  Doylene is doing well mentally.  She is feeling good overall.  She does not have any current stress concerns or depression.  She is  sleeping well most night unless she has caffiene too close to bed time.  Jyl Heinz is doing well mentally and doing well in rehab.  She does not have any stressors or concerns.  Her sleep is still a bit of an issue.  Most nights she is able to get at least 6 hrs.  We talked about the importance of sleep hygine and going to bed when sleepy.  Dhruti is doing well mentally.  She does not have any major stressors or concerns.  Her sleep has gotten a lot better.  She has stopped napping during the day and is able to sleep better at night now.  She has also established a bedtime routine and sticks to it.  Priscila reports no new stressors and feel like she is doing well mentally. She is still sleeping well and had continued with her bedtime routine. She stated that she is not eating late a night right before bed, which she feels helps her sleep better. Braniya is getting ready to graduate and plans to continue to exercise in the [facility] and stick with the heathy lifestyle changes she had made.    Expected Outcomes  Short: Continue to stay positive.  Long: Continue to practice self care.  Short: Review sleep information.  Long: Continue to get better rest  Short: Continue to sleep better.  Long: Continue to stay positive.  Short: Graduate from [employer] and continue to exercise at the [facility]. Long: Maintain heart healthty habbits and sleep routines upon graduation from [facility] program.    Interventions  Encouraged to attend Cardiac Rehabilitation for the exercise  Encouraged to attend Cardiac Rehabilitation for the exercise  Encouraged to attend Cardiac Rehabilitation for the exercise  Encouraged to attend Cardiac Rehabilitation for the exercise    Continue  Psychosocial Services   Follow up required by counselor  Follow up required by staff  Follow up required by staff  Follow up required by staff       Psychosocial Discharge (Final Psychosocial Re-Evaluation): Psychosocial Re-Evaluation - 08/10/19 0817       Psychosocial Re-Evaluation   Current issues with  None Identified    Comments  Zoie reports no new stressors and feel like she is doing well mentally. She is still sleeping well and had continued with her bedtime routine. She stated that she is not eating late a night right before bed, which she feels helps her sleep better. Malva is getting ready to graduate and plans to continue to exercise in the [facility] and stick with the heathy lifestyle changes she had made.    Expected Outcomes  Short: Graduate from [facility] and continue to exercise at the [facility]. Long: Maintain heart healthty habbits and sleep routines upon graduation from [facility] program.    Interventions  Encouraged to attend Cardiac Rehabilitation for the exercise    Continue Psychosocial Services   Follow up required by staff       Vocational Rehabilitation: Provide vocational rehab assistance to qualifying candidates.   Vocational Rehab Evaluation & Intervention: Vocational Rehab - 05/08/19 1112      Initial Vocational Rehab Evaluation & Intervention   Assessment shows need for Vocational Rehabilitation  No       Education: Education Goals: Education classes will be provided on a variety of topics geared toward better understanding of heart health and risk factor modification. Participant will state understanding/return demonstration of topics presented as noted by education test scores.  Learning Barriers/Preferences:   Education Topics:  AED/CPR: - Group verbal and written instruction with the use of models to demonstrate the basic use of the AED with the basic ABC's of resuscitation.   General Nutrition Guidelines/Fats and Fiber: -Group instruction provided by verbal, written material, models and posters to present the general guidelines for heart healthy nutrition. Gives an explanation and review of dietary fats and fiber.   Controlling Sodium/Reading Food Labels: -Group verbal and written  material supporting the discussion of sodium use in heart healthy nutrition. Review and explanation with models, verbal and written materials for utilization of the food label.   Exercise Physiology & General Exercise Guidelines: - Group verbal and written instruction with models to review the exercise physiology of the cardiovascular system and associated critical values. Provides general exercise guidelines with specific guidelines to those with heart or lung disease.    Aerobic Exercise & Resistance Training: - Gives group verbal and written instruction on the various components of exercise. Focuses on aerobic and resistive training programs and the benefits of this training and how to safely progress through these programs..   Cardiac Rehab from 08/26/2019 in [facility] Cardiac and Pulmonary Rehab  Date  08/26/19  Educator  As  Instruction Review Code  1- Verbalizes Understanding      Flexibility, Balance, Mind/Body Relaxation: Provides group verbal/written instruction on the benefits of flexibility and balance training, including mind/body exercise modes such as yoga, pilates and tai chi.  Demonstration and skill practice provided.   Stress and Anxiety: - Provides group verbal and written instruction about the health risks of elevated stress and causes of high stress.  Discuss the correlation between heart/lung disease and anxiety and treatment options. Review healthy ways to manage with stress and anxiety.   Depression: - Provides group verbal and written instruction on the correlation  between heart/lung disease and depressed mood, treatment options, and the stigmas associated with seeking treatment.   Anatomy & Physiology of the Heart: - Group verbal and written instruction and models provide basic cardiac anatomy and physiology, with the coronary electrical and arterial systems. Review of Valvular disease and Heart Failure   Cardiac Procedures: - Group verbal and written instruction  to review commonly prescribed medications for heart disease. Reviews the medication, class of the drug, and side effects. Includes the steps to properly store meds and maintain the prescription regimen. (beta blockers and nitrates)   Cardiac Rehab from 08/26/2019 in [facility] Cardiac and Pulmonary Rehab  Date  08/26/19  Educator  sb  Instruction Review Code  1- Verbalizes Understanding      Cardiac Medications I: - Group verbal and written instruction to review commonly prescribed medications for heart disease. Reviews the medication, class of the drug, and side effects. Includes the steps to properly store meds and maintain the prescription regimen.   Cardiac Medications II: -Group verbal and written instruction to review commonly prescribed medications for heart disease. Reviews the medication, class of the drug, and side effects. (all other drug classes)   Cardiac Rehab from 08/12/2019 in [facility] Cardiac and Pulmonary Rehab  Date  08/12/19  Educator  SB  Instruction Review Code  1- Verbalizes Understanding       Go Sex-Intimacy & Heart Disease, Get SMART - Goal Setting: - Group verbal and written instruction through game format to discuss heart disease and the return to sexual intimacy. Provides group verbal and written material to discuss and apply goal setting through the application of the S.M.A.R.T. Method.   Cardiac Rehab from 08/26/2019 in [facility] Cardiac and Pulmonary Rehab  Date  08/26/19  Educator  sb  Instruction Review Code  1- Verbalizes Understanding      Other Matters of the Heart: - Provides group verbal, written materials and models to describe Stable Angina and Peripheral Artery. Includes description of the disease process and treatment options available to the cardiac patient.   Exercise & Equipment Safety: - Individual verbal instruction and demonstration of equipment use and safety with use of the equipment.   Cardiac Rehab from 08/12/2019 in [facility] Cardiac and Pulmonary  Rehab  Date  05/18/19  Educator  [facility]  Instruction Review Code  1- Verbalizes Understanding      Infection Prevention: - Provides verbal and written material to individual with discussion of infection control including proper hand washing and proper equipment cleaning during exercise session.   Cardiac Rehab from 08/12/2019 in [facility] Cardiac and Pulmonary Rehab  Date  05/18/19  Educator  [facility]  Instruction Review Code  1- Verbalizes Understanding      Falls Prevention: - Provides verbal and written material to individual with discussion of falls prevention and safety.   Cardiac Rehab from 08/12/2019 in [facility] Cardiac and Pulmonary Rehab  Date  05/18/19  Educator  [facility]  Instruction Review Code  1- Verbalizes Understanding      Diabetes: - Individual verbal and written instruction to review signs/symptoms of diabetes, desired ranges of glucose level fasting, after meals and with exercise. Acknowledge that pre and post exercise glucose checks will be done for 3 sessions at entry of program.   Know Your Numbers and Risk Factors: -Group verbal and written instruction about important numbers in your health.  Discussion of what are risk factors and how they play a role in the disease process.  Review of Cholesterol, Blood Pressure, Diabetes, and BMI  and the role they play in your overall health.   Cardiac Rehab from 08/12/2019 in [facility] Cardiac and Pulmonary Rehab  Date  08/12/19  Educator  SB  Instruction Review Code  1- Verbalizes Understanding      Sleep Hygiene: -Provides group verbal and written instruction about how sleep can affect your health.  Define sleep hygiene, discuss sleep cycles and impact of sleep habits. Review good sleep hygiene tips.    Other: -Provides group and verbal instruction on various topics (see comments)   Knowledge Questionnaire Score: Knowledge Questionnaire Score - 08/27/19 0746      Knowledge Questionnaire Score   Pre Score  17/26   Stress, PAD,  Exercise,Nutrition and Tobacco questions missed correxct response    Post Score  17/26 Stress, PAD, Exercise, Nutrition, Heart Failure       Core Components/Risk Factors/Patient Goals at Admission: Personal Goals and Risk Factors at Admission - 05/18/19 1220      Core Components/Risk Factors/Patient Goals on Admission    Weight Management  Yes;Weight Loss    Intervention  Weight Management: Develop a combined nutrition and exercise program designed to reach desired caloric intake, while maintaining appropriate intake of nutrient and fiber, sodium and fats, and appropriate energy expenditure required for the weight goal.;Weight Management: Provide education and appropriate resources to help participant work on and attain dietary goals.    Admit Weight  184 lb (83.5 kg)    Goal Weight: Short Term  178 lb (80.7 kg)    Goal Weight: Long Term  170 lb (77.1 kg)    Expected Outcomes  Short Term: Continue to assess and modify interventions until short term weight is achieved;Long Term: Adherence to nutrition and physical activity/exercise program aimed toward attainment of established weight goal;Weight Loss: Understanding of general recommendations for a balanced deficit meal plan, which promotes 1-2 lb weight loss per week and includes a negative energy balance of [phone] kcal/d;Understanding recommendations for meals to include 15-35% energy as protein, 25-35% energy from fat, 35-60% energy from carbohydrates, less than 245m of dietary cholesterol, 20-35 gm of total fiber daily;Understanding of distribution of calorie intake throughout the day with the consumption of 4-5 meals/snacks    Lipids  Yes    Intervention  Provide education and support for participant on nutrition & aerobic/resistive exercise along with prescribed medications to achieve LDL <758m HDL >4081m   Expected Outcomes  Short Term: Participant states understanding of desired cholesterol values and is compliant with medications  prescribed. Participant is following exercise prescription and nutrition guidelines.;Long Term: Cholesterol controlled with medications as prescribed, with individualized exercise RX and with personalized nutrition plan. Value goals: LDL < 26m30mDL > 40 mg.       Core Components/Risk Factors/Patient Goals Review:  Goals and Risk Factor Review    Row Name 05/04/19 0923 06/15/19 0827 07/06/19 0823 08/03/19 0845 08/10/19 [id number] Core Components/Risk Factors/Patient Goals Review   Personal Goals Review  Weight Management/Obesity;Hypertension  Weight Management/Obesity;Hypertension  Weight Management/Obesity;Hypertension  Weight Management/Obesity;Hypertension;Lipids  Weight Management/Obesity;Hypertension;Lipids   Review  Overall she is doing well.  Her weight has stayed steady and her blood pressures have been doing well.  She does have a follow up scheduled with the surgeon on Wednesday of this week for increased incisional soreness and right sided tightness in her chest.  She is 4 month post surgery so within the normal window for pain, but she does have a chest xray scheduled for that day to make sure  nothing else is going on.  She will keep [place] posted.  Alfreida is doing well in rehab. She is not losing weight like she wants.  So we talked abour walking longer at home and adding in intervals in rehab.  Her blood pressures have been good and she checks them daily.  Her next follow up is with her primary tomorrow.  Shaunessy is doing well in rehab.  Her weight has been steady which is frustrating her.  She continues to walk more and using intervals and workload increases in rehab.  Her pressures continue to do well in rehab.  She did well at her f/u appt but was deficient in magnesium which she is now taking.  Dereona is doing well in rehab. Her weight was up just a little after being out for a week.  She is walking more at home to help bring it down.  Her blood pressures have been good and she continues to  check them.  She did a call from her doctor about her lipids being higher so they upped her medications.  Veena reports taking all medications as prescribed by doctor. She is managing her lipids and blood pressure with meds, exercise, and diet. Her weight had been maintained with small ups and downs.   Expected Outcomes  Short: Talk to doctor about chest/incisional pain.  Long: Continue to monitor risk factors  Short: Talk to doctor tomorrow.  Long: Continue to work on weight loss.  Short: Continue to work on weight loss.  Long: Continue to monitor risk factors.  Short: Continue to work on weight loss.  Long: Continue to monitor risk factors.  Short: Continue to work on weight loss.  Long: Continue to monitor risk factors.      Core Components/Risk Factors/Patient Goals at Discharge (Final Review):  Goals and Risk Factor Review - 08/10/19 0828      Core Components/Risk Factors/Patient Goals Review   Personal Goals Review  Weight Management/Obesity;Hypertension;Lipids    Review  Airyanna reports taking all medications as prescribed by doctor. She is managing her lipids and blood pressure with meds, exercise, and diet. Her weight had been maintained with small ups and downs.    Expected Outcomes  Short: Continue to work on weight loss.  Long: Continue to monitor risk factors.       ITP Comments: ITP Comments    Row Name 04/14/19 1656 04/15/19 1519 05/04/19 0918 05/08/19 1119 05/18/19 1215   ITP Comments  Initial Cardiac Home Based Care visit completed Intake and Consent completed Appts for followup done with EP and RD  Completed initial ExRx created and sent to Dr. Emily Filbert, Medical Director to review and sign.  Completed virtual visit. Scheduled to orient on 7/17 and 7/21.  Orientation via phone completed today   Gym orientation with  EP/RD Eval is scheduled for 7/27 at 11 AM.  Eleri is having surgery to remove the sternal wires on Mon 7/20.  Completed 6MWT and gym orientation today.   Documentation for diagnosis can be found in [facility] encounter [facility]/05/19.  Initial ITP created and sent for review to Dr. Emily Filbert, Medical Director.   [place] Name 06/03/19 [phone] 07/01/19 0616 07/13/19 0909 07/29/19 1048 08/26/19 0639   ITP Comments  30 Day Review Completed today. Continue with ITP unless changed by Medical Director review.  New to program  30 Day review. Continue with ITP unless directed changes per Medical Director review.  NoTreadmill today and for a week. Ahsley is experiencing increased foot  pain and thinks may be from walking on the treadmill. She has a history of foot trouble. She wants to try a week without using the treadmill and reevaluate after the week is over.  30 day review completed. ITP sent to Dr. Emily Filbert, Medical Director of Cardiac and Pulmonary Rehab. Continue with ITP unless changes are made by physician.  Department closed starting 10/2 until further notice by infection prevention and Health at Work teams for COVID-19.  30 day review completed. Continue with ITP sent to Dr. Emily Filbert, Medical Director of Cardiac and Pulmonary Rehab for review , changes as needed and signature.   Row Name 08/28/19 0816           ITP Comments  Heba graduated today from  rehab with 35 sessions completed.  Details of the patient's exercise prescription and what He needs to do in order to continue the prescription and progress were discussed with patient.  Patient was given a copy of prescription and goals.  Patient verbalized understanding.  Arlie plans to continue to exercise by going to the [place].          Comments: Discharged

## 2019-08-28 NOTE — Progress Notes (Signed)
Daily Session Note  Patient Details  Name: Jessica [name] MRN: [id number] Date of Birth: Dec 07, 1950 Referring Provider:     Cardiac Rehab from 05/18/2019 in [facility] Cardiac and Pulmonary Rehab  Referring Provider  Quay Burow MD      Encounter Date: 08/28/2019  Check In: Session Check In - 08/28/19 0815      Check-In   Supervising physician immediately available to respond to emergencies  See telemetry face sheet for immediately available ER MD    Location  [facility]-Cardiac & Pulmonary Rehab    Staff Present  Heath Lark, RN, BSN, CCRP;Jessica [place], MA, RCEP, CCRP, CCET    Virtual Visit  No    Medication changes reported      No    Fall or balance concerns reported     No    Warm-up and Cool-down  Performed on first and last piece of equipment    Resistance Training Performed  Yes    VAD Patient?  No    PAD/SET Patient?  No      Pain Assessment   Currently in Pain?  No/denies          Social History   Tobacco Use  Smoking Status Never Smoker  Smokeless Tobacco Never Used    Goals Met:  Independence with exercise equipment Exercise tolerated well No report of cardiac concerns or symptoms  Goals Unmet:  Not Applicable  Comments:  Jessica [name] graduated today from  rehab with 35 sessions completed.  Details of the patient's exercise prescription and what He needs to do in order to continue the prescription and progress were discussed with patient.  Patient was given a copy of prescription and goals.  Patient verbalized understanding.  Jessica [name] plans to continue to exercise by going to the [place].  [facility] Name 05/18/19 1216 08/17/19 0822 08/28/19 0823     6 Minute Walk   Phase  Initial  Discharge  Discharge   Distance  1770 feet  1800 feet  1950 feet   Distance % Change  -  2 %  10.7 %   Distance Feet Change  -  30 ft  180 ft   Walk Time  6 minutes  6 minutes  6 minutes   # of Rest Breaks  0  0  0   MPH  3.35  3.41  3.69   METS  3.86  3.78  4.42   RPE  _0 VO2 Peak  13.49  13.24  15.5   Symptoms  No  No  No   Resting HR  73 bpm  67 bpm  73 bpm   Resting BP  136/70  110/72  122/64   Resting Oxygen Saturation   99 %  -  -   Exercise Oxygen Saturation  during 6 min walk  99 %  -  -   Max Ex. HR  107 bpm  102 bpm  134 bpm   Max Ex. BP  144/76  138/80  144/60   2 Minute Post BP  136/70  -  -        Dr. Emily Filbert is Medical Director for [facility].

## 2019-08-28 NOTE — Progress Notes (Signed)
Discharge Progress Report  Patient Details  Name: Jessica [name] MRN: [id number] Date of Birth: 1951-09-07 Referring Provider:     Cardiac Rehab from 05/18/2019 in [facility]  Referring Provider  Quay Burow MD       Number of Visits: 35/36  Reason for Discharge:  Patient reached a stable level of exercise. Patient independent in their exercise. Patient has met program and personal goals.  Smoking History:  Social History   Tobacco Use  Smoking Status Never Smoker  Smokeless Tobacco Never Used    Diagnosis:  S/P CABG x 5  ADL UCSD:   Initial Exercise Prescription: Initial Exercise Prescription - 05/18/19 1200      Date of Initial Exercise RX and Referring Provider   Date  05/18/19    Referring Provider  Quay Burow MD      Treadmill   MPH  3.3    Grade  2    Minutes  15    METs  4.21      NuStep   Level  3    SPM  80    Minutes  15    METs  3      Recumbant Elliptical   Level  2    RPM  50    Minutes  15    METs  3      Prescription Details   Frequency (times per week)  3    Duration  Progress to 30 minutes of continuous aerobic without signs/symptoms of physical distress      Intensity   THRR 40-80% of Max Heartrate  105-136    Ratings of Perceived Exertion  11-13    Perceived Dyspnea  0-4      Progression   Progression  Continue to progress workloads to maintain intensity without signs/symptoms of physical distress.      Resistance Training   Training Prescription  Yes    Weight  4 lbs    Reps  10-15       Discharge Exercise Prescription (Final Exercise Prescription Changes): Exercise Prescription Changes - 08/20/19 1400      Response to Exercise   Blood Pressure (Admit)  120/68    Blood Pressure (Exercise)  126/78    Blood Pressure (Exit)  98/60    Heart Rate (Admit)  61 bpm    Heart Rate (Exercise)  96 bpm    Heart Rate (Exit)  76 bpm    Rating of Perceived Exertion (Exercise)  13    Symptoms   none    Duration  Continue with 30 min of aerobic exercise without signs/symptoms of physical distress.    Intensity  THRR unchanged      Progression   Progression  Continue to progress workloads to maintain intensity without signs/symptoms of physical distress.    Average METs  3.6      Resistance Training   Training Prescription  Yes    Weight  4 lb    Reps  10-15      Interval Training   Interval Training  No      Treadmill   MPH  3.5    Grade  2    Minutes  15    METs  4.65      NuStep   Level  4    Minutes  15    METs  2.5      Home Exercise Plan   Plans to continue exercise at  Home (comment)  walking, weights   Frequency  Add 2 additional days to program exercise sessions.    Initial Home Exercises Provided  06/01/19       Functional Capacity: 6 Minute Walk    Row Name 05/18/19 1216 08/17/19 0822 08/28/19 0823     6 Minute Walk   Phase  Initial  Discharge  Discharge   Distance  1770 feet  1800 feet  1950 feet   Distance % Change  -  2 %  10.7 %   Distance Feet Change  -  30 ft  180 ft   Walk Time  6 minutes  6 minutes  6 minutes   # of Rest Breaks  0  0  0   MPH  3.35  3.41  3.69   METS  3.86  3.78  4.42   RPE  7  13  15    VO2 Peak  13.49  13.24  15.5   Symptoms  No  No  No   Resting HR  73 bpm  67 bpm  73 bpm   Resting BP  136/70  110/72  122/64   Resting Oxygen Saturation   99 %  -  -   Exercise Oxygen Saturation  during 6 min walk  99 %  -  -   Max Ex. HR  107 bpm  102 bpm  134 bpm   Max Ex. BP  144/76  138/80  144/60   2 Minute Post BP  136/70  -  -      Psychological, QOL, Others - Outcomes: PHQ 2/9: Depression screen [facility] 2/9 05/18/2019 12/31/2018 10/03/2017 09/16/2017 07/27/2016  Decreased Interest 1 0 0 0 0  Down, Depressed, Hopeless 0 0 0 0 0  PHQ - 2 Score 1 0 0 0 0  Altered sleeping 0 - 3 - -  Tired, decreased energy 1 - 1 - -  Change in appetite 0 - 0 - -  Feeling bad or failure about yourself  0 - 0 - -  Trouble concentrating 0 -  0 - -  Moving slowly or fidgety/restless 0 - 0 - -  Suicidal thoughts 0 - 0 - -  PHQ-9 Score 2 - 4 - -  Difficult doing work/chores Not difficult at all - Not difficult at all - -    Quality of Life: Quality of Life - 08/27/19 0748      Quality of Life Scores   Health/Function Pre  26.6 %    Health/Function Post  26.2 %    Health/Function % Change  -1.5 %    Socioeconomic Pre  26.43 %    Socioeconomic Post  24.56 %    Socioeconomic % Change   -7.08 %    Psych/Spiritual Pre  29.14 %    Psych/Spiritual Post  24.85 %    Psych/Spiritual % Change  -14.72 %    Family Pre  23.4 %    Family Post  24.6 %    Family % Change  5.13 %    GLOBAL Pre  26.61 %    GLOBAL Post  25.32 %    GLOBAL % Change  -4.85 %       Personal Goals: Goals established at orientation with interventions provided to work toward goal. Personal Goals and Risk Factors at Admission - 05/18/19 1220      Core Components/Risk Factors/Patient Goals on Admission    Weight Management  Yes;Weight Loss    Intervention  Weight Management: Develop a  combined nutrition and exercise program designed to reach desired caloric intake, while maintaining appropriate intake of nutrient and fiber, sodium and fats, and appropriate energy expenditure required for the weight goal.;Weight Management: Provide education and appropriate resources to help participant work on and attain dietary goals.    Admit Weight  184 lb (83.5 kg)    Goal Weight: Short Term  178 lb (80.7 kg)    Goal Weight: Long Term  170 lb (77.1 kg)    Expected Outcomes  Short Term: Continue to assess and modify interventions until short term weight is achieved;Long Term: Adherence to nutrition and physical activity/exercise program aimed toward attainment of established weight goal;Weight Loss: Understanding of general recommendations for a balanced deficit meal plan, which promotes 1-2 lb weight loss per week and includes a negative energy balance of [phone]  kcal/d;Understanding recommendations for meals to include 15-35% energy as protein, 25-35% energy from fat, 35-60% energy from carbohydrates, less than 252m of dietary cholesterol, 20-35 gm of total fiber daily;Understanding of distribution of calorie intake throughout the day with the consumption of 4-5 meals/snacks    Lipids  Yes    Intervention  Provide education and support for participant on nutrition & aerobic/resistive exercise along with prescribed medications to achieve LDL <781m HDL >4064m   Expected Outcomes  Short Term: Participant states understanding of desired cholesterol values and is compliant with medications prescribed. Participant is following exercise prescription and nutrition guidelines.;Long Term: Cholesterol controlled with medications as prescribed, with individualized exercise RX and with personalized nutrition plan. Value goals: LDL < 50m19mDL > 40 mg.        Personal Goals Discharge: Goals and Risk Factor Review    Row Name 05/04/19 0923 06/15/19 0827 07/06/19 0823 08/03/19 0845 08/10/19 [id number] Core Components/Risk Factors/Patient Goals Review   Personal Goals Review  Weight Management/Obesity;Hypertension  Weight Management/Obesity;Hypertension  Weight Management/Obesity;Hypertension  Weight Management/Obesity;Hypertension;Lipids  Weight Management/Obesity;Hypertension;Lipids   Review  Overall she is doing well.  Her weight has stayed steady and her blood pressures have been doing well.  She does have a follow up scheduled with the surgeon on Wednesday of this week for increased incisional soreness and right sided tightness in her chest.  She is 4 month post surgery so within the normal window for pain, but she does have a chest xray scheduled for that day to make sure nothing else is going on.  She will keep us pKoreated.  MarlKeiradoing well in rehab. She is not losing weight like she wants.  So we talked abour walking longer at home and adding in intervals in rehab.   Her blood pressures have been good and she checks them daily.  Her next follow up is with her primary tomorrow.  MarlAnabelendoing well in rehab.  Her weight has been steady which is frustrating her.  She continues to walk more and using intervals and workload increases in rehab.  Her pressures continue to do well in rehab.  She did well at her f/u appt but was deficient in magnesium which she is now taking.  MarlShantanadoing well in rehab. Her weight was up just a little after being out for a week.  She is walking more at home to help bring it down.  Her blood pressures have been good and she continues to check them.  She did a call from her doctor about her lipids being higher so they upped her medications.  MarlEmmaliaorts taking all  medications as prescribed by doctor. She is managing her lipids and blood pressure with meds, exercise, and diet. Her weight had been maintained with small ups and downs.   Expected Outcomes  Short: Talk to doctor about chest/incisional pain.  Long: Continue to monitor risk factors  Short: Talk to doctor tomorrow.  Long: Continue to work on weight loss.  Short: Continue to work on weight loss.  Long: Continue to monitor risk factors.  Short: Continue to work on weight loss.  Long: Continue to monitor risk factors.  Short: Continue to work on weight loss.  Long: Continue to monitor risk factors.      Exercise Goals and Review: Exercise Goals    Row Name 04/15/19 1521 05/18/19 1219           Exercise Goals   Increase Physical Activity  Yes  Yes      Intervention  Provide advice, education, support and counseling about physical activity/exercise needs.;Develop an individualized exercise prescription for aerobic and resistive training based on initial evaluation findings, risk stratification, comorbidities and participant's personal goals.  Provide advice, education, support and counseling about physical activity/exercise needs.;Develop an individualized exercise  prescription for aerobic and resistive training based on initial evaluation findings, risk stratification, comorbidities and participant's personal goals.      Expected Outcomes  Short Term: Attend rehab on a regular basis to increase amount of physical activity.;Long Term: Add in home exercise to make exercise part of routine and to increase amount of physical activity.;Long Term: Exercising regularly at least 3-5 days a week.  Short Term: Attend rehab on a regular basis to increase amount of physical activity.;Long Term: Add in home exercise to make exercise part of routine and to increase amount of physical activity.;Long Term: Exercising regularly at least 3-5 days a week.      Increase Strength and Stamina  Yes  Yes      Intervention  Provide advice, education, support and counseling about physical activity/exercise needs.;Develop an individualized exercise prescription for aerobic and resistive training based on initial evaluation findings, risk stratification, comorbidities and participant's personal goals.  Provide advice, education, support and counseling about physical activity/exercise needs.;Develop an individualized exercise prescription for aerobic and resistive training based on initial evaluation findings, risk stratification, comorbidities and participant's personal goals.      Expected Outcomes  Short Term: Increase workloads from initial exercise prescription for resistance, speed, and METs.;Short Term: Perform resistance training exercises routinely during rehab and add in resistance training at home;Long Term: Improve cardiorespiratory fitness, muscular endurance and strength as measured by increased METs and functional capacity (6MWT)  Short Term: Increase workloads from initial exercise prescription for resistance, speed, and METs.;Short Term: Perform resistance training exercises routinely during rehab and add in resistance training at home;Long Term: Improve cardiorespiratory fitness,  muscular endurance and strength as measured by increased METs and functional capacity (6MWT)      Able to understand and use rate of perceived exertion (RPE) scale  Yes  Yes      Intervention  Provide education and explanation on how to use RPE scale  Provide education and explanation on how to use RPE scale      Expected Outcomes  Short Term: Able to use RPE daily in rehab to express subjective intensity level;Long Term:  Able to use RPE to guide intensity level when exercising independently  Short Term: Able to use RPE daily in rehab to express subjective intensity level;Long Term:  Able to use RPE to guide intensity level  when exercising independently      Able to understand and use Dyspnea scale  Yes  Yes      Intervention  Provide education and explanation on how to use Dyspnea scale  Provide education and explanation on how to use Dyspnea scale      Expected Outcomes  Short Term: Able to use Dyspnea scale daily in rehab to express subjective sense of shortness of breath during exertion;Long Term: Able to use Dyspnea scale to guide intensity level when exercising independently  Short Term: Able to use Dyspnea scale daily in rehab to express subjective sense of shortness of breath during exertion;Long Term: Able to use Dyspnea scale to guide intensity level when exercising independently      Knowledge and understanding of Target Heart Rate Range (THRR)  Yes  Yes      Intervention  Provide education and explanation of THRR including how the numbers were predicted and where they are located for reference  Provide education and explanation of THRR including how the numbers were predicted and where they are located for reference      Expected Outcomes  Short Term: Able to state/look up THRR;Short Term: Able to use daily as guideline for intensity in rehab;Long Term: Able to use THRR to govern intensity when exercising independently  Short Term: Able to state/look up THRR;Short Term: Able to use daily as  guideline for intensity in rehab;Long Term: Able to use THRR to govern intensity when exercising independently      Able to check pulse independently  Yes  Yes      Intervention  Provide education and demonstration on how to check pulse in carotid and radial arteries.;Review the importance of being able to check your own pulse for safety during independent exercise  Provide education and demonstration on how to check pulse in carotid and radial arteries.;Review the importance of being able to check your own pulse for safety during independent exercise      Expected Outcomes  Short Term: Able to explain why pulse checking is important during independent exercise;Long Term: Able to check pulse independently and accurately  Short Term: Able to explain why pulse checking is important during independent exercise;Long Term: Able to check pulse independently and accurately      Understanding of Exercise Prescription  Yes  Yes      Intervention  Provide education, explanation, and written materials on patient's individual exercise prescription  Provide education, explanation, and written materials on patient's individual exercise prescription      Expected Outcomes  Short Term: Able to explain program exercise prescription;Long Term: Able to explain home exercise prescription to exercise independently  Short Term: Able to explain program exercise prescription;Long Term: Able to explain home exercise prescription to exercise independently         Exercise Goals Re-Evaluation: Exercise Goals Re-Evaluation    Row Name 05/04/19 7078 05/22/19 0751 05/25/19 1028 06/01/19 0807 06/12/19 1131     Exercise Goal Re-Evaluation   Exercise Goals Review  Increase Physical Activity;Increase Strength and Stamina;Understanding of Exercise Prescription  Understanding of Exercise Prescription;Increase Physical Activity;Increase Strength and Stamina;Able to understand and use rate of perceived exertion (RPE) scale;Knowledge and  understanding of Target Heart Rate Range (THRR)  Increase Physical Activity;Increase Strength and Stamina;Understanding of Exercise Prescription  Increase Physical Activity;Increase Strength and Stamina;Understanding of Exercise Prescription  Increase Physical Activity;Increase Strength and Stamina;Able to understand and use rate of perceived exertion (RPE) scale;Knowledge and understanding of Target Heart Rate Range (THRR);Able to check  pulse independently;Understanding of Exercise Prescription   Comments  Lillian is doing well at home.  She has been walking some, but not consistent enough.  She is eager to come into rehab and has scheduled her orientation. She has also been doing some hand weights at home as well.  She has been aiming for 3-4 days a week for about 10-15 minutes each.  Reviewed RPE scale, THR and program prescription with pt today.  Pt voiced understanding and was given a copy of goals to take home.  Erianna is off to a good start in rehab. She has completed her first two full days of exercise.  We will continue to monitor her progression.  Felix is still doing her one extra day a week at home.  She is also doing her weights at home.  Updated home exercise with pt today.  Pt plans to continue to walk at home for exercise.  Reviewed THR, pulse, RPE, sign and symptoms, NTG use, and when to call 911 or MD.  Also discussed weather considerations and indoor options.  Pt voiced understanding.  Gregg has increased speed and grade on TM.  She is up to 4 lb for strength training.   Expected Outcomes  Short: Complete 6MWT.  Long: Attend rehab consistently  Short: Use RPE daily to regulate intensity. Long: Follow program prescription in THR.  Short: Continue to attend rehab regularly  Long: Continue to follow program prescription.  Short: Continue to attend rehab. Long: Continue to exercise independently.  Short - continue current programs at home and HT Long - improve overall MET level   Row Name  06/15/19 6948 06/24/19 1333 07/06/19 0818 07/10/19 1248 07/21/19 1444     Exercise Goal Re-Evaluation   Exercise Goals Review  Increase Physical Activity;Increase Strength and Stamina;Understanding of Exercise Prescription  Increase Physical Activity;Increase Strength and Stamina;Able to understand and use rate of perceived exertion (RPE) scale;Knowledge and understanding of Target Heart Rate Range (THRR);Able to check pulse independently;Understanding of Exercise Prescription  Increase Physical Activity;Increase Strength and Stamina;Understanding of Exercise Prescription  Increase Physical Activity;Increase Strength and Stamina;Able to understand and use rate of perceived exertion (RPE) scale;Knowledge and understanding of Target Heart Rate Range (THRR);Able to check pulse independently;Understanding of Exercise Prescription  Increase Physical Activity;Increase Strength and Stamina;Understanding of Exercise Prescription   Comments  Donique is doing well in rehab.  She has noticed that when she goes out to walk it just wipes her out.  We talked about it being due to the heat and humidity.  She will try to get up to go out earlier in the day to help.  She has noticed that her strength and stamina are improving overall. She can tell that rehab has really made a difference for her.  Nida missed a day due to back pain.  She has been back and did seated machines.  Brenae is doing well in rehab.  She is getting more at home and going on her off days from class!!  She feels that her strength and stamina are continuing to recover!!  Chestina has increased workload on TM.  She attends consistently and works in correct RPE range  Yaris continues to do well in rehab.  The treadmill has been bothering her foot.  It sounds as though she may have the beginnings of plantar facisitis and we talked about some exercises to help her relieve this.  She is now up to level 4 on the NuStep and using 4 lbs weights.  We  will talk  about adding in intervals.  We will continue to monitor her progress.   Expected Outcomes  Short: Try to get up earlier on off days for exercise.  Long: Continue to improve strength and stamina.  Short - continue to attend consistently Long - improve overall MET levels  Short: Continue to exercise on off days.  Long: Continue to improve strength and stamina.  Short - stay consistent with exercise Long - improve overall MET level  Short: Talk about adding in intervals.  Long; Continue to to improve stamina.   [place] Name 08/03/19 3532 08/10/19 0816 08/26/19 1201         Exercise Goal Re-Evaluation   Exercise Goals Review  Increase Physical Activity;Increase Strength and Stamina;Understanding of Exercise Prescription  Increase Physical Activity;Increase Strength and Stamina;Understanding of Exercise Prescription  Increase Physical Activity;Increase Strength and Stamina;Understanding of Exercise Prescription     Comments  Kimisha is doing well in rehab.  She continues to exericse daily with walking.  Her foot is feeling better and she no longer hurts when she walks.  She is hoping the treadmill will no longer bother her.  Analaura is getting ready to graduate from [employer] and plans to exercise at the [facility] following graduation. Her foot is still feeling good with exercise and she feel like she has made a lot of progress with her execise and strength. Her only complaint is that she still feel weekness in her legs, especially when climbing stairs. She had talked to her doctor about this and she is monitoring it. If this continues her doctor may check blood flow to the legs.  Harman will be graduating on Friday.  She is planning to join the [employer] and already has her orientation appointment set up.  She has done well.     Expected Outcomes  Short: Continue to walk daily and talk about intervals.  Long: Continue to improve stamina.  Short: continue to monitor leg weakness and follow up with doctor about this  issue. Long: Continue a heart heathly exercise program upon graduation.  Short: Graduate!  Long: Continue to exercise independently.        Nutrition & Weight - Outcomes: Pre Biometrics - 05/18/19 1219      Pre Biometrics   Height  5' 6"  (1.676 m)    Weight  184 lb (83.5 kg)    BMI (Calculated)  29.71    Single Leg Stand  22.78 seconds      Post Biometrics - 08/17/19 0824       Post  Biometrics   Height  5' 6"  (1.676 m)    Weight  186 lb 11.2 oz (84.7 kg)    BMI (Calculated)  30.15    Single Leg Stand  30 seconds       Nutrition: Nutrition Therapy & Goals - 04/20/19 1502      Nutrition Therapy   Diet  HH, low Na diet    Drug/Food Interactions  Statins/Certain Fruits   crestor   Protein (specify units)  65g    Fiber  25 grams    Whole Grain Foods  3 servings    Saturated Fats  12 max. grams    Fruits and Vegetables  5 servings/day    Sodium  1.5 grams      Personal Nutrition Goals   Nutrition Goal  ST: LT: maintain wt, increase energy further, manage healthy    Comments  Sometimes won't eat most of the morning. chicken salad tomatoes,  zucchini. Will also eat yogurt, yoplait.  Walnuts before bed. When she does eat B she has waffles, toast [place] bacon, boiled egg and grits. 60oz water per day. End of March to April would eat 2 meals/day and now eats 1 big meal. Since heart event cut out snacks, soft drinks (used to have 2-3/day). 179lbs maintaing wt. discussed [employer], HH eating, Na, Fiber, Fat, added sugar and added salt.      Intervention Plan   Intervention  Prescribe, educate and counsel regarding individualized specific dietary modifications aiming towards targeted core components such as weight, hypertension, lipid management, diabetes, heart failure and other comorbidities.;Nutrition handout(s) given to patient.   Nutrition info given in email   Expected Outcomes  Short Term Goal: Understand basic principles of dietary content, such as calories, fat, sodium,  cholesterol and nutrients.;Short Term Goal: A plan has been developed with personal nutrition goals set during dietitian appointment.;Long Term Goal: Adherence to prescribed nutrition plan.       Nutrition Discharge: Nutrition Assessments - 08/27/19 0747      MEDFICTS Scores   Pre Score  17    Post Score  15    Score Difference  -2       Education Questionnaire Score: Knowledge Questionnaire Score - 08/27/19 0746      Knowledge Questionnaire Score   Pre Score  17/26   Stress, PAD, Exercise,Nutrition and Tobacco questions missed correxct response    Post Score  17/26 Stress, PAD, Exercise, Nutrition, Heart Failure       Goals reviewed with patient; copy given to patient.

## 2019-08-30 ENCOUNTER — Encounter: Payer: Self-pay | Admitting: Pharmacist

## 2019-08-30 DIAGNOSIS — G720 Drug-induced myopathy: Secondary | ICD-10-CM | POA: Insufficient documentation

## 2019-08-30 NOTE — Assessment & Plan Note (Addendum)
LDL remains above goal for secondary prevention for patient with hx of longstanding hypercholesterolemia, and recent CABGx5. Patient is also intolerant to atorvastatin, simvastatin and low dose rosuvastatin.  We discussed indication, storage, administration, monitoring, common side -effects,  prior-authorization process, and patient assistance available for PACK9i (Repatha and Praluent).   Patient was somehow reluctant to initiate injectable therapy, but after providing a sample of Repatha SureClick 140mg  for self-administration, patient expressed satisfaction with process and will like to move forward with home PCKS9i therapy.   We will start process for Praleunt/Repatha prior-authorization, and follow up with patient as needed.

## 2019-08-31 ENCOUNTER — Telehealth: Payer: Self-pay

## 2019-08-31 MED ORDER — PRALUENT 150 MG/ML ~~LOC~~ SOAJ: 150.0000 mg | SUBCUTANEOUS | 11 refills | Status: DC

## 2019-08-31 NOTE — Telephone Encounter (Signed)
Called and informed the pt that they praluent is approved, rx sent, instrcted the pt to call back if the cost is unaffordable

## 2019-09-21 ENCOUNTER — Encounter: Payer: Self-pay | Admitting: Internal Medicine

## 2019-09-21 ENCOUNTER — Other Ambulatory Visit (INDEPENDENT_AMBULATORY_CARE_PROVIDER_SITE_OTHER): Payer: Medicare Other

## 2019-09-21 ENCOUNTER — Ambulatory Visit (INDEPENDENT_AMBULATORY_CARE_PROVIDER_SITE_OTHER): Payer: Medicare Other | Admitting: Internal Medicine

## 2019-09-21 ENCOUNTER — Other Ambulatory Visit: Payer: Self-pay

## 2019-09-21 VITALS — BP 130/84 | HR 50 | Temp 97.8°F | Ht 66.5 in | Wt 192.0 lb

## 2019-09-21 DIAGNOSIS — Z0000 Encounter for general adult medical examination without abnormal findings: Secondary | ICD-10-CM

## 2019-09-21 DIAGNOSIS — K219 Gastro-esophageal reflux disease without esophagitis: Secondary | ICD-10-CM

## 2019-09-21 DIAGNOSIS — Z853 Personal history of malignant neoplasm of breast: Secondary | ICD-10-CM | POA: Diagnosis not present

## 2019-09-21 DIAGNOSIS — E782 Mixed hyperlipidemia: Secondary | ICD-10-CM | POA: Diagnosis not present

## 2019-09-21 LAB — COMPREHENSIVE METABOLIC PANEL WITH GFR
ALT: 21 U/L (ref 0–35)
AST: 28 U/L (ref 0–37)
Albumin: 4 g/dL (ref 3.5–5.2)
Alkaline Phosphatase: 59 U/L (ref 39–117)
BUN: 15 mg/dL (ref 6–23)
CO2: 27 meq/L (ref 19–32)
Calcium: 9.3 mg/dL (ref 8.4–10.5)
Chloride: 104 meq/L (ref 96–112)
Creatinine, Ser: 0.98 mg/dL (ref 0.40–1.20)
GFR: 68.08 mL/min
Glucose, Bld: 98 mg/dL (ref 70–99)
Potassium: 3.9 meq/L (ref 3.5–5.1)
Sodium: 139 meq/L (ref 135–145)
Total Bilirubin: 0.4 mg/dL (ref 0.2–1.2)
Total Protein: 7.3 g/dL (ref 6.0–8.3)

## 2019-09-21 LAB — HEMOGLOBIN A1C: Hgb A1c MFr Bld: 5.9 % (ref 4.6–6.5)

## 2019-09-21 LAB — LIPID PANEL
Cholesterol: 115 mg/dL (ref 0–200)
HDL: 37.9 mg/dL — ABNORMAL LOW
LDL Cholesterol: 62 mg/dL (ref 0–99)
NonHDL: 77.21
Total CHOL/HDL Ratio: 3
Triglycerides: 76 mg/dL (ref 0.0–149.0)
VLDL: 15.2 mg/dL (ref 0.0–40.0)

## 2019-09-21 MED ORDER — HYDROCORTISONE (PERIANAL) 2.5 % EX CREA: 1.0000 "application " | TOPICAL_CREAM | Freq: Two times a day (BID) | CUTANEOUS | 6 refills | Status: DC

## 2019-09-21 NOTE — Assessment & Plan Note (Signed)
Checking lipid panel as she has been on praluent for about 1-2 months now.

## 2019-09-21 NOTE — Assessment & Plan Note (Signed)
Stable off meds. ?

## 2019-09-21 NOTE — Assessment & Plan Note (Signed)
Flu shot up to date for season. Pneumonia complete. Shingrix counseled. Tetanus due 2027. Colonoscopy she states due but we do not have last report, last done 2015. Mammogram due 2021, pap smear aged out and dexa due 2021. Counseled about sun safety and mole surveillance. Counseled about the dangers of distracted driving. Given 10 year screening recommendations.

## 2019-09-21 NOTE — Progress Notes (Signed)
Subjective:   Patient ID: Jessica [name], female    DOB: Aug 10, 1951, 68 y.o.   MRN: [id number]  HPI Here for medicare wellness and physical, no new complaints. Please see A/P for status and treatment of chronic medical problems.   Diet: heart healthy  Physical activity: active 5 days a week Depression/mood screen: negative Hearing: intact to whispered voice Visual acuity: grossly normal with lens, performs annual eye exam  ADLs: capable Fall risk: none Home safety: good Cognitive evaluation: intact to orientation, naming, recall and repetition EOL planning: adv directives discussed    Cardiac Rehab from 05/18/2019 in [facility]  PHQ-2 Total Score  1        Cardiac Rehab from 05/18/2019 in [facility]  PHQ-9 Total Score  2      I have personally reviewed and have noted 1. The patient's medical and social history - reviewed today no changes 2. Their use of alcohol, tobacco or illicit drugs 3. Their current medications and supplements 4. The patient's functional ability including ADL's, fall risks, home safety risks and hearing or visual impairment. 5. Diet and physical activities 6. Evidence for depression or mood disorders 7. Care team reviewed and updated  Patient Care Team: Hoyt Koch, MD as PCP - General (Internal Medicine) Lorretta Harp, MD as PCP - Cardiology (Cardiology) Past Medical History:  Diagnosis Date  . Allergy   . Breast cancer ([place]) 2000  . Cancer [facility])    breast  . Colon polyp   . Coronary artery disease   . GERD (gastroesophageal reflux disease)   . History of blood transfusion   . Hyperlipidemia   . Migraine    history of migrarines, none as an adult  . Personal history of radiation therapy 2000   Past Surgical History:  Procedure Laterality Date  . ABDOMINAL HYSTERECTOMY    . BREAST LUMPECTOMY Right 2000  . BREAST SURGERY Right    calcifaction removed  . BUNIONECTOMY Bilateral    . COLONOSCOPY W/ POLYPECTOMY    . CORONARY ARTERY BYPASS GRAFT N/A 01/05/2019   Procedure: CORONARY ARTERY BYPASS GRAFTING (CABG), using right leg saphenous endoscopic and open vein harvest, exploration left leg;  Surgeon: Gaye Pollack, MD;  Location: [place] OR;  Service: Open Heart Surgery;  Laterality: N/A;  . EYE SURGERY Bilateral    cataract  . hemorrhoid    . LEFT HEART CATH AND CORONARY ANGIOGRAPHY N/A 01/01/2019   Procedure: LEFT HEART CATH AND CORONARY ANGIOGRAPHY;  Surgeon: Jettie Booze, MD;  Location: [place] CV LAB;  Service: Cardiovascular;  Laterality: N/A;  . STERNAL WIRES REMOVAL N/A 05/11/2019   Procedure: STERNAL WIRES REMOVAL;  Surgeon: Gaye Pollack, MD;  Location: [place];  Service: Thoracic;  Laterality: N/A;  . TEE WITHOUT CARDIOVERSION N/A 01/05/2019   Procedure: TRANSESOPHAGEAL ECHOCARDIOGRAM (TEE);  Surgeon: Gaye Pollack, MD;  Location: [place];  Service: Open Heart Surgery;  Laterality: N/A;   Family History  Problem Relation Age of Onset  . Heart disease Brother   . Asthma Paternal Aunt   . Diabetes Paternal Aunt   . Stroke Paternal Uncle   . Kidney disease Paternal Uncle     Review of Systems  Constitutional: Negative.   HENT: Negative.   Eyes: Negative.   Respiratory: Negative for cough, chest tightness and shortness of breath.   Cardiovascular: Negative for chest pain, palpitations and leg swelling.  Gastrointestinal: Negative for abdominal distention, abdominal pain,  constipation, diarrhea, nausea and vomiting.  Musculoskeletal: Negative.   Skin: Negative.   Neurological: Negative.   Psychiatric/Behavioral: Negative.     Objective:  Physical Exam Constitutional:      Appearance: She is well-developed.  HENT:     Head: Normocephalic and atraumatic.  Neck:     Musculoskeletal: Normal range of motion.  Cardiovascular:     Rate and Rhythm: Normal rate and regular rhythm.  Pulmonary:     Effort: Pulmonary effort is normal. No  respiratory distress.     Breath sounds: Normal breath sounds. No wheezing or rales.  Abdominal:     General: Bowel sounds are normal. There is no distension.     Palpations: Abdomen is soft.     Tenderness: There is no abdominal tenderness. There is no rebound.  Skin:    General: Skin is warm and dry.  Neurological:     Mental Status: She is alert and oriented to person, place, and time.     Coordination: Coordination normal.     Vitals:   09/21/19 0856  BP: 130/84  Pulse: (!) 50  Temp: 97.8 F (36.6 C)  TempSrc: Oral  SpO2: 98%  Weight: 192 lb (87.1 kg)  Height: 5' 6.5" (1.689 m)    This visit occurred during the SARS-CoV-2 public health emergency.  Safety protocols were in place, including screening questions prior to the visit, additional usage of staff PPE, and extensive cleaning of exam room while observing appropriate contact time as indicated for disinfecting solutions.   Assessment & Plan:

## 2019-09-21 NOTE — Assessment & Plan Note (Signed)
Yearly mammogram.  

## 2019-09-21 NOTE — Patient Instructions (Signed)
Health Maintenance, Female Adopting a healthy lifestyle and getting preventive care are important in promoting health and wellness. Ask your health care provider about:  The right schedule for you to have regular tests and exams.  Things you can do on your own to prevent diseases and keep yourself healthy. What should I know about diet, weight, and exercise? Eat a healthy diet   Eat a diet that includes plenty of vegetables, fruits, low-fat dairy products, and lean protein.  Do not eat a lot of foods that are high in solid fats, added sugars, or sodium. Maintain a healthy weight Body mass index (BMI) is used to identify weight problems. It estimates body fat based on height and weight. Your health care provider can help determine your BMI and help you achieve or maintain a healthy weight. Get regular exercise Get regular exercise. This is one of the most important things you can do for your health. Most adults should:  Exercise for at least 150 minutes each week. The exercise should increase your heart rate and make you sweat (moderate-intensity exercise).  Do strengthening exercises at least twice a week. This is in addition to the moderate-intensity exercise.  Spend less time sitting. Even light physical activity can be beneficial. Watch cholesterol and blood lipids Have your blood tested for lipids and cholesterol at 69 years of age, then have this test every 5 years. Have your cholesterol levels checked more often if:  Your lipid or cholesterol levels are high.  You are older than 69 years of age.  You are at high risk for heart disease. What should I know about cancer screening? Depending on your health history and family history, you may need to have cancer screening at various ages. This may include screening for:  Breast cancer.  Cervical cancer.  Colorectal cancer.  Skin cancer.  Lung cancer. What should I know about heart disease, diabetes, and high blood  pressure? Blood pressure and heart disease  High blood pressure causes heart disease and increases the risk of stroke. This is more likely to develop in people who have high blood pressure readings, are of African descent, or are overweight.  Have your blood pressure checked: ? Every 3-5 years if you are 18-39 years of age. ? Every year if you are 40 years old or older. Diabetes Have regular diabetes screenings. This checks your fasting blood sugar level. Have the screening done:  Once every three years after age 40 if you are at a normal weight and have a low risk for diabetes.  More often and at a younger age if you are overweight or have a high risk for diabetes. What should I know about preventing infection? Hepatitis B If you have a higher risk for hepatitis B, you should be screened for this virus. Talk with your health care provider to find out if you are at risk for hepatitis B infection. Hepatitis C Testing is recommended for:  Everyone born from 1945 through 1965.  Anyone with known risk factors for hepatitis C. Sexually transmitted infections (STIs)  Get screened for STIs, including gonorrhea and chlamydia, if: ? You are sexually active and are younger than 69 years of age. ? You are older than 69 years of age and your health care provider tells you that you are at risk for this type of infection. ? Your sexual activity has changed since you were last screened, and you are at increased risk for chlamydia or gonorrhea. Ask your health care provider if   you are at risk.  Ask your health care provider about whether you are at high risk for HIV. Your health care provider may recommend a prescription medicine to help prevent HIV infection. If you choose to take medicine to prevent HIV, you should first get tested for HIV. You should then be tested every 3 months for as long as you are taking the medicine. Pregnancy  If you are about to stop having your period (premenopausal) and  you may become pregnant, seek counseling before you get pregnant.  Take 400 to 800 micrograms (mcg) of folic acid every day if you become pregnant.  Ask for birth control (contraception) if you want to prevent pregnancy. Osteoporosis and menopause Osteoporosis is a disease in which the bones lose minerals and strength with aging. This can result in bone fractures. If you are 65 years old or older, or if you are at risk for osteoporosis and fractures, ask your health care provider if you should:  Be screened for bone loss.  Take a calcium or vitamin D supplement to lower your risk of fractures.  Be given hormone replacement therapy (HRT) to treat symptoms of menopause. Follow these instructions at home: Lifestyle  Do not use any products that contain nicotine or tobacco, such as cigarettes, e-cigarettes, and chewing tobacco. If you need help quitting, ask your health care provider.  Do not use street drugs.  Do not share needles.  Ask your health care provider for help if you need support or information about quitting drugs. Alcohol use  Do not drink alcohol if: ? Your health care provider tells you not to drink. ? You are pregnant, may be pregnant, or are planning to become pregnant.  If you drink alcohol: ? Limit how much you use to 0-1 drink a day. ? Limit intake if you are breastfeeding.  Be aware of how much alcohol is in your drink. In the [place] one drink equals one 12 oz bottle of beer (355 mL), one 5 oz glass of wine (148 mL), or one 1 oz glass of hard liquor (44 mL). General instructions  Schedule regular health, dental, and eye exams.  Stay current with your vaccines.  Tell your health care provider if: ? You often feel depressed. ? You have ever been abused or do not feel safe at home. Summary  Adopting a healthy lifestyle and getting preventive care are important in promoting health and wellness.  Follow your health care provider's instructions about healthy  diet, exercising, and getting tested or screened for diseases.  Follow your health care provider's instructions on monitoring your cholesterol and blood pressure. This information is not intended to replace advice given to you by your health care provider. Make sure you discuss any questions you have with your health care provider. Document Released: 04/23/2011 Document Revised: 10/01/2018 Document Reviewed: 10/01/2018 Elsevier Patient Education  2020 Elsevier Inc.  

## 2019-09-29 ENCOUNTER — Telehealth: Payer: Self-pay

## 2019-09-29 NOTE — Telephone Encounter (Signed)
Returned patient call. Left message to call back to schedule appointment.

## 2019-09-30 ENCOUNTER — Telehealth: Payer: Self-pay

## 2019-09-30 NOTE — Telephone Encounter (Signed)
Returned patient call and scheduled her for tomorrow 11am

## 2019-10-01 ENCOUNTER — Other Ambulatory Visit: Payer: Self-pay

## 2019-10-01 ENCOUNTER — Encounter: Payer: Self-pay | Admitting: Family Medicine

## 2019-10-01 ENCOUNTER — Ambulatory Visit (INDEPENDENT_AMBULATORY_CARE_PROVIDER_SITE_OTHER): Payer: Medicare Other | Admitting: Family Medicine

## 2019-10-01 VITALS — BP 116/86 | HR 62 | Ht 66.5 in | Wt 192.0 lb

## 2019-10-01 DIAGNOSIS — M79642 Pain in left hand: Secondary | ICD-10-CM

## 2019-10-01 DIAGNOSIS — M19049 Primary osteoarthritis, unspecified hand: Secondary | ICD-10-CM | POA: Diagnosis not present

## 2019-10-01 NOTE — Patient Instructions (Addendum)
  [address], [address]st floor [place], [place] [address] Phone [phone]  Arnica lotion 2x a day Pennsaid 2x a day Vitamin D 2000IU daily Tart Cherry Extract 1200mg  at night Glove liners at night See me again in 5-6 weeks, if not better will consider injection

## 2019-10-01 NOTE — Progress Notes (Signed)
[name] [address], [place] [address] Phone: [phone] Subjective:   [name], am serving as a scribe for Dr. Hulan Saas.  This visit occurred during the SARS-CoV-2 public health emergency.  Safety protocols were in place, including screening questions prior to the visit, additional usage of staff PPE, and extensive cleaning of exam room while observing appropriate contact time as indicated for disinfecting solutions.     CC: Hand arthritis   [id number]   09/30/2018 Very nonspecific pain.  Discussed with patient in great length.  Possible superficial phlebitis.  Patient given doxycycline and aspirin.  Differential includes an anterior tibialis tear.  Exercises given, compressions discussed, proper shoes.  Discussed avoiding being barefoot.  Stress reaction of the tibia could also be there and will need to consider further work-up including x-rays if patient does not make response.  Patient was able to walk fairly regularly.  Follow-up with me again 4 to 6 weeks  Update 10/01/2019 Jessica [name] is a 68 y.o. female coming in with complaint of left thumb CMC joint pain and right hand pain over the 2nd and 3rd MCP joint. Pain began about 1 month ago. Pain is constant. Is using Tylenol extra strength. Notes that she was in rehab and believes holding the handles might have contributed to the pain.  Patient does not remember 1 incident when patient started to give her difficulty.     Past Medical History:  Diagnosis Date  . Allergy   . Breast cancer ([place]) 2000  . Cancer [facility])    breast  . Colon polyp   . Coronary artery disease   . GERD (gastroesophageal reflux disease)   . History of blood transfusion   . Hyperlipidemia   . Migraine    history of migrarines, none as an adult  . Personal history of radiation therapy 2000   Past Surgical History:  Procedure Laterality Date  . ABDOMINAL HYSTERECTOMY    . BREAST LUMPECTOMY  Right 2000  . BREAST SURGERY Right    calcifaction removed  . BUNIONECTOMY Bilateral   . COLONOSCOPY W/ POLYPECTOMY    . CORONARY ARTERY BYPASS GRAFT N/A 01/05/2019   Procedure: CORONARY ARTERY BYPASS GRAFTING (CABG), using right leg saphenous endoscopic and open vein harvest, exploration left leg;  Surgeon: Gaye Pollack, MD;  Location: [place] OR;  Service: Open Heart Surgery;  Laterality: N/A;  . EYE SURGERY Bilateral    cataract  . hemorrhoid    . LEFT HEART CATH AND CORONARY ANGIOGRAPHY N/A 01/01/2019   Procedure: LEFT HEART CATH AND CORONARY ANGIOGRAPHY;  Surgeon: Jettie Booze, MD;  Location: [place] CV LAB;  Service: Cardiovascular;  Laterality: N/A;  . STERNAL WIRES REMOVAL N/A 05/11/2019   Procedure: STERNAL WIRES REMOVAL;  Surgeon: Gaye Pollack, MD;  Location: [place];  Service: Thoracic;  Laterality: N/A;  . TEE WITHOUT CARDIOVERSION N/A 01/05/2019   Procedure: TRANSESOPHAGEAL ECHOCARDIOGRAM (TEE);  Surgeon: Gaye Pollack, MD;  Location: [place];  Service: Open Heart Surgery;  Laterality: N/A;   Social History   Socioeconomic History  . Marital status: Married    Spouse name: Not on file  . Number of children: Not on file  . Years of education: Not on file  . Highest education level: Master's degree (e.g., MA, MS, MEng, MEd, MSW, MBA)  Occupational History  . Not on file  Tobacco Use  . Smoking status: Never Smoker  . Smokeless tobacco: Never Used  Substance and Sexual Activity  . Alcohol use: No  . Drug use: No  . Sexual activity: Yes  Other Topics Concern  . Not on file  Social History Narrative  . Not on file   Social Determinants of Health   Financial Resource Strain: Low Risk   . Difficulty of Paying Living Expenses: Not hard at all  Food Insecurity: No Food Insecurity  . Worried About Charity fundraiser in the Last Year: Never true  . Ran Out of Food in the Last Year: Never true  Transportation Needs: No Transportation Needs  . Lack of  Transportation (Medical): No  . Lack of Transportation (Non-Medical): No  Physical Activity: Inactive  . Days of Exercise per Week: 0 days  . Minutes of Exercise per Session: 0 min  Stress: Stress Concern Present  . Feeling of Stress : To some extent  Social Connections: Not Isolated  . Frequency of Communication with Friends and Family: More than three times a week  . Frequency of Social Gatherings with Friends and Family: More than three times a week  . Attends Religious Services: More than 4 times per year  . Active Member of Clubs or Organizations: Not on file  . Attends Archivist Meetings: More than 4 times per year  . Marital Status: Married   Allergies  Allergen Reactions  . Codeine Nausea And Vomiting  . Sulfa Antibiotics Swelling  . Sulfacetamide Swelling   Family History  Problem Relation Age of Onset  . Heart disease Brother   . Asthma Paternal Aunt   . Diabetes Paternal Aunt   . Stroke Paternal Uncle   . Kidney disease Paternal Uncle      Current Outpatient Medications (Cardiovascular):  [name]  Alirocumab (PRALUENT) 150 MG/ML SOAJ, Inject 150 mg into the skin every 14 (fourteen) days. .  metoprolol tartrate (LOPRESSOR) 25 MG tablet, Take 0.5 tablets (12.5 mg total) by mouth 2 (two) times daily. (Patient taking differently: Take 12.5 mg by mouth daily. )  Current Outpatient Medications (Respiratory):  .  loratadine (CLARITIN) 10 MG tablet, Take 10 mg by mouth every evening.   Current Outpatient Medications (Analgesics):  .  aspirin EC 325 MG EC tablet, Take 1 tablet (325 mg total) by mouth daily. (Patient taking differently: Take 325 mg by mouth at bedtime. )   Current Outpatient Medications (Other):  [name]  Ascorbic Acid (VITAMIN C) 1000 MG tablet, Take 2,000 mg by mouth daily. .  Cholecalciferol (VITAMIN D-3) 125 MCG (5000 UT) TABS, Take 5,000 Units by mouth daily. .  Digestive Enzymes (DIGESTIVE ENZYME PO), Take 1 capsule by mouth 2 (two) times daily.  .   hydrocortisone (ANUSOL-HC) 2.5 % rectal cream, Place 1 application rectally 2 (two) times daily. .  Multiple Vitamin (MULTIVITAMIN WITH MINERALS) TABS tablet, Take 1 tablet by mouth daily.    Past medical history, social, surgical and family history all reviewed in electronic medical record.  No pertanent information unless stated regarding to the chief complaint.   Review of Systems:  No headache, visual changes, nausea, vomiting, diarrhea, constipation, dizziness, abdominal pain, skin rash, fevers, chills, night sweats, weight loss, swollen lymph nodes, body aches, joint swelling, muscle aches, chest pain, shortness of breath, mood changes.   Objective  Blood pressure 116/86, pulse 62, height 5' 6.5" (1.689 m), weight 192 lb (87.1 kg), SpO2 97 %.     General: No apparent distress alert and oriented x3 mood and affect normal, dressed appropriately.  HEENT: Pupils equal,  extraocular movements intact  Respiratory: Patient's speak in full sentences and does not appear short of breath  Cardiovascular: No lower extremity edema, non tender, no erythema  Skin: Warm dry intact with no signs of infection or rash on extremities or on axial skeleton.  Abdomen: Soft nontender  Neuro: Cranial nerves II through XII are intact, neurovascularly intact in all extremities with 2+ DTRs and 2+ pulses.  Lymph: No lymphadenopathy of posterior or anterior cervical chain or axillae bilaterally.  Gait normal with good balance and coordination.  MSK:  tender with limited range of motion and good stability and symmetric strength and tone of shoulders, elbows, wrist, hip, knee and ankles bilaterally.  Mild arthritic changes of multiple joints Hand exam does show some mild swelling of the CMC joint of the left hand.  Positive grind test noted.  MCP across the left and right hand do have some effusion noted.  Full range of motion though noted but is sore to touch.  Minimal arthritic changes of the DIPs bilaterally.   Mild ulnar deviation of the hands bilaterally  Limited musculoskeletal ultrasound was performed and interpreted by Lyndal Pulley  Limited hand arthritis shows the patient does have small effusions bilaterally.  Mild to moderate arthritic changes noted.    Impression and Recommendations:     This case required medical decision making of moderate complexity. The above documentation has been reviewed and is accurate and complete Lyndal Pulley, DO       Note: This dictation was prepared with Dragon dictation along with smaller phrase technology. Any transcriptional errors that result from this process are unintentional.

## 2019-10-01 NOTE — Assessment & Plan Note (Signed)
Hand arthritis bilaterally.  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  Discussed with patient about the crepitus that is occurring.  We discussed topical anti-inflammatories, over-the-counter medications, held on any type of injection, discussed potential gloves at night that seems to be beneficial as well.  See how patient responds.  Follow-up again in 4 to 8 weeks

## 2019-10-09 ENCOUNTER — Ambulatory Visit: Payer: Medicare Other | Admitting: Cardiovascular Disease

## 2019-10-09 ENCOUNTER — Encounter: Payer: Self-pay | Admitting: Cardiovascular Disease

## 2019-10-09 ENCOUNTER — Other Ambulatory Visit: Payer: Self-pay

## 2019-10-09 DIAGNOSIS — Z951 Presence of aortocoronary bypass graft: Secondary | ICD-10-CM | POA: Diagnosis not present

## 2019-10-09 NOTE — Progress Notes (Signed)
10/09/2019 Jessica [name]   1951-09-06  [id number]  Primary Physician Hoyt Koch, MD Primary Cardiologist: Lorretta Harp MD Garret Reddish, [place], [place]  HPI:  Jessica [name] is a 68 y.o.  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 She does have a history of hyperlipidemia.  She presented to [facility] on 12/31/2018 with unstable angina and underwent cardiac catheterization by Dr. Irish Lack the following day revealing three-vessel disease with preserved LV function.  On 01/05/2019 she underwent CABG x5 by Dr. Cyndia Bent the LIMA to her LAD, vein to diagonal branch, RCA and obtuse marginal branches 1 and 2 sequentially.  She is been complaining of some musculoskeletal pains at home with some mild shortness of breath.  She did not participate in cardiac rehab because of COVID-19.  She was put on high-dose statin therapy which she was intolerant to all that responsive to.    Since last saw her 6 months ago she did undergo sternal rewiring by Dr. Cyndia Bent in July.  She is statin intolerant and was placed on Praluent with an excellent result although she is having some issues with administering this.  She denies ischemic chest pain.   No outpatient medications have been marked as taking for the 10/09/19 encounter (Office Visit) with Lorretta Harp, MD.     Allergies  Allergen Reactions  . Codeine Nausea And Vomiting  . Sulfa Antibiotics Swelling  . Sulfacetamide Swelling    Social History   Socioeconomic History  . Marital status: Married    Spouse name: Not on file  . Number of children: Not on file  . Years of education: Not on file  . Highest education level: Master's degree (e.g., MA, MS, MEng, MEd, MSW, MBA)  Occupational History  . Not on file  Tobacco Use  . Smoking status: Never Smoker  . Smokeless tobacco: Never Used  Substance and Sexual Activity  . Alcohol use: No  .  Drug use: No  . Sexual activity: Yes  Other Topics Concern  . Not on file  Social History Narrative  . Not on file   Social Determinants of Health   Financial Resource Strain:   . Difficulty of Paying Living Expenses: Not on file  Food Insecurity:   . Worried About Charity fundraiser in the Last Year: Not on file  . Ran Out of Food in the Last Year: Not on file  Transportation Needs:   . Lack of Transportation (Medical): Not on file  . Lack of Transportation (Non-Medical): Not on file  Physical Activity:   . Days of Exercise per Week: Not on file  . Minutes of Exercise per Session: Not on file  Stress:   . Feeling of Stress : Not on file  Social Connections:   . Frequency of Communication with Friends and Family: Not on file  . Frequency of Social Gatherings with Friends and Family: Not on file  . Attends Religious Services: Not on file  . Active Member of Clubs or Organizations: Not on file  . Attends Archivist Meetings: Not on file  . Marital Status: Not on file  Intimate Partner Violence:   . Fear of Current or Ex-Partner: Not on file  . Emotionally Abused: Not on file  . Physically Abused: Not on file  . Sexually Abused: Not on file     Review of Systems: General: negative for  chills, fever, night sweats or weight changes.  Cardiovascular: negative for chest pain, dyspnea on exertion, edema, orthopnea, palpitations, paroxysmal nocturnal dyspnea or shortness of breath Dermatological: negative for rash Respiratory: negative for cough or wheezing Urologic: negative for hematuria Abdominal: negative for nausea, vomiting, diarrhea, bright red blood per rectum, melena, or hematemesis Neurologic: negative for visual changes, syncope, or dizziness All other systems reviewed and are otherwise negative except as noted above.    Blood pressure 133/84, pulse (!) 52, temperature (!) 97 F (36.1 C), height 5' 6.5" (1.689 m), weight 190 lb 12.8 oz (86.5 kg), SpO2 100  %.  General appearance: alert and no distress Neck: no adenopathy, no carotid bruit, no JVD, supple, symmetrical, trachea midline and thyroid not enlarged, symmetric, no tenderness/mass/nodules Lungs: clear to auscultation bilaterally Heart: regular rate and rhythm, S1, S2 normal, no murmur, click, rub or gallop Extremities: extremities normal, atraumatic, no cyanosis or edema Pulses: 2+ and symmetric Skin: Skin color, texture, turgor normal. No rashes or lesions Neurologic: Alert and oriented X 3, normal strength and tone. Normal symmetric reflexes. Normal coordination and gait  EKG sinus bradycardia at 52 without ST or T wave changes.  There were septal Q waves and low but low voltage.  I personally reviewed this EKG.  ASSESSMENT AND PLAN:   S/P CABG x 5 History of CAD status post cardiac catheterization performed by Dr. Irish Lack revealing three-vessel disease which ultimately led to CABG x5 by Dr. Cyndia Bent with a LIMA to the LAD, vein to diagonal branch, RCA and obtuse marginal branches 1 and 2 sequentially.  Her postop course was really unremarkable.  She underwent sternal rewiring by Dr. Cyndia Bent in July.  She continues to have positional chest wall pain.  Hyperlipidemia History of hyperlipidemia intolerant to statin therapy on Praluent with lipid profile performed 09/21/2019 revealing total cholesterol of 115, LDL 62 and HDL of 37.      Lorretta Harp MD [facility], [facility] 10/09/2019 9:55 AM

## 2019-10-09 NOTE — Assessment & Plan Note (Signed)
History of CAD status post cardiac catheterization performed by Dr. Irish Lack revealing three-vessel disease which ultimately led to CABG x5 by Dr. Cyndia Bent with a LIMA to the LAD, vein to diagonal branch, RCA and obtuse marginal branches 1 and 2 sequentially.  Her postop course was really unremarkable.  She underwent sternal rewiring by Dr. Cyndia Bent in July.  She continues to have positional chest wall pain.

## 2019-10-09 NOTE — Assessment & Plan Note (Signed)
History of hyperlipidemia intolerant to statin therapy on Praluent with lipid profile performed 09/21/2019 revealing total cholesterol of 115, LDL 62 and HDL of 37.

## 2019-10-09 NOTE — Patient Instructions (Addendum)
Medication Instructions:  Your physician recommends that you continue on your current medications as directed. Please refer to the Current Medication list given to you today.  *If you need a refill on your cardiac medications before your next appointment, please call your pharmacy*  Lab Work: NONE ordered at this time of appointment   If you have labs (blood work) drawn today and your tests are completely normal, you will receive your results only by: [name] MyChart Message (if you have MyChart) OR . A paper copy in the mail If you have any lab test that is abnormal or we need to change your treatment, we will call you to review the results.  Testing/Procedures: NONE ordered at this time of appointment   Follow-Up: At [facility], you and your health needs are our priority.  As part of our continuing mission to provide you with exceptional heart care, we have created designated Provider Care Teams.  These Care Teams include your primary Cardiologist (physician) and Advanced Practice Providers (APPs -  Physician Assistants and Nurse Practitioners) who all work together to provide you with the care you need, when you need it.  Your next appointment:   6 month(s) 12 MONTHS  The format for your next appointment:   In Person IN PERSON  Provider:   Almyra Deforest, PA-C  Quay Burow, MD Other Instructions

## 2019-10-19 ENCOUNTER — Telehealth: Payer: Self-pay

## 2019-10-19 NOTE — Telephone Encounter (Signed)
Called the pt to get their updated insurance card  As follows : [id number]  [id number]

## 2019-10-19 NOTE — Telephone Encounter (Signed)
-----   Message from Rockne Menghini, [place] sent at 10/09/2019 10:20 AM EST ----- Pt will have new insurance for Praluent on January 1.  Does not have card yet.  Can you please call her on Dec 28 to see if she has the card with new info.  You will have to wait until Jan 4 to process, but she has enough med - her last dose at home is due on Jan 1. Erasmo Downer

## 2019-10-28 ENCOUNTER — Encounter: Payer: Self-pay | Admitting: Family Medicine

## 2019-10-28 ENCOUNTER — Other Ambulatory Visit: Payer: Self-pay

## 2019-10-28 ENCOUNTER — Ambulatory Visit (INDEPENDENT_AMBULATORY_CARE_PROVIDER_SITE_OTHER): Payer: Medicare PPO | Admitting: Family Medicine

## 2019-10-28 ENCOUNTER — Ambulatory Visit (INDEPENDENT_AMBULATORY_CARE_PROVIDER_SITE_OTHER): Payer: Medicare PPO

## 2019-10-28 VITALS — HR 57 | Ht 66.0 in | Wt 190.0 lb

## 2019-10-28 DIAGNOSIS — M1812 Unilateral primary osteoarthritis of first carpometacarpal joint, left hand: Secondary | ICD-10-CM | POA: Diagnosis not present

## 2019-10-28 DIAGNOSIS — M79642 Pain in left hand: Secondary | ICD-10-CM | POA: Diagnosis not present

## 2019-10-28 NOTE — Assessment & Plan Note (Signed)
Patient given injection today.  Tolerated the procedure well, discussed possible bracing.  Topical anti-inflammatories, icing regimen.  Patient also had some mild signs on ultrasound that seem to be more of a de Quervain's tenosynovitis and if not improved in 6 weeks I would consider to repeat the injection.  Warned of the possibility of hypopigmentation in the area secondary to the superficial aspect.  Follow-up with me again 6 weeks

## 2019-10-28 NOTE — Progress Notes (Signed)
[facility] [place] Phone: [phone] Subjective:   Jessica [name], am serving as a scribe for Dr. Hulan Saas. This visit occurred during the SARS-CoV-2 public health emergency.  Safety protocols were in place, including screening questions prior to the visit, additional usage of staff PPE, and extensive cleaning of exam room while observing appropriate contact time as indicated for disinfecting solutions.   I'm seeing this patient by the request  of:    CC: Left thumb pain follow-up  [id number]   12/100/2020 Hand arthritis bilaterally.  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  Discussed with patient about the crepitus that is occurring.  We discussed topical anti-inflammatories, over-the-counter medications, held on any type of injection, discussed potential gloves at night that seems to be beneficial as well.  See how patient responds.  Follow-up again in 4 to 8 weeks  Update 10/28/2019 Jessica [name] is a 69 y.o. female coming in with complaint of left hand pain. Patient states that she has hand an increase in her pain which began when she was doing rehab and holding onto the handles of the exercise equipment. States that pain was so bad over the holidays that she almost had to go into Urgent Care.      Past Medical History:  Diagnosis Date  . Allergy   . Breast cancer ([place]) 2000  . Cancer [facility])    breast  . Colon polyp   . Coronary artery disease   . GERD (gastroesophageal reflux disease)   . History of blood transfusion   . Hyperlipidemia   . Migraine    history of migrarines, none as an adult  . Personal history of radiation therapy 2000   Past Surgical History:  Procedure Laterality Date  . ABDOMINAL HYSTERECTOMY    . BREAST LUMPECTOMY Right 2000  . BREAST SURGERY Right    calcifaction removed  . BUNIONECTOMY Bilateral   . COLONOSCOPY W/ POLYPECTOMY    . CORONARY ARTERY BYPASS GRAFT N/A 01/05/2019    Procedure: CORONARY ARTERY BYPASS GRAFTING (CABG), using right leg saphenous endoscopic and open vein harvest, exploration left leg;  Surgeon: Gaye Pollack, MD;  Location: [facility] OR;  Service: Open Heart Surgery;  Laterality: N/A;  . EYE SURGERY Bilateral    cataract  . hemorrhoid    . LEFT HEART CATH AND CORONARY ANGIOGRAPHY N/A 01/01/2019   Procedure: LEFT HEART CATH AND CORONARY ANGIOGRAPHY;  Surgeon: Jettie Booze, MD;  Location: [facility];  Service: Cardiovascular;  Laterality: N/A;  . STERNAL WIRES REMOVAL N/A 05/11/2019   Procedure: STERNAL WIRES REMOVAL;  Surgeon: Gaye Pollack, MD;  Location: [facility];  Service: Thoracic;  Laterality: N/A;  . TEE WITHOUT CARDIOVERSION N/A 01/05/2019   Procedure: TRANSESOPHAGEAL ECHOCARDIOGRAM (TEE);  Surgeon: Gaye Pollack, MD;  Location: [facility];  Service: Open Heart Surgery;  Laterality: N/A;   Social History   Socioeconomic History  . Marital status: Married    Spouse name: Not on file  . Number of children: Not on file  . Years of education: Not on file  . Highest education level: Master's degree (e.g., MA, MS, MEng, MEd, MSW, MBA)  Occupational History  . Not on file  Tobacco Use  . Smoking status: Never Smoker  . Smokeless tobacco: Never Used  Substance and Sexual Activity  . Alcohol use: [name]  . Drug use: [name]  . Sexual activity: Yes  Other Topics Concern  . Not  on file  Social History Narrative  . Not on file   Social Determinants of Health   Financial Resource Strain:   . Difficulty of Paying Living Expenses: Not on file  Food Insecurity:   . Worried About Charity fundraiser in the Last Year: Not on file  . Ran Out of Food in the Last Year: Not on file  Transportation Needs:   . Lack of Transportation (Medical): Not on file  . Lack of Transportation (Non-Medical): Not on file  Physical Activity:   . Days of Exercise per Week: Not on file  . Minutes of Exercise per Session: Not on file  Stress:   . Feeling of  Stress : Not on file  Social Connections:   . Frequency of Communication with Friends and Family: Not on file  . Frequency of Social Gatherings with Friends and Family: Not on file  . Attends Religious Services: Not on file  . Active Member of Clubs or Organizations: Not on file  . Attends Archivist Meetings: Not on file  . Marital Status: Not on file   Allergies  Allergen Reactions  . Codeine Nausea And Vomiting  . Sulfa Antibiotics Swelling  . Sulfacetamide Swelling   Family History  Problem Relation Age of Onset  . Heart disease Brother   . Asthma Paternal Aunt   . Diabetes Paternal Aunt   . Stroke Paternal Uncle   . Kidney disease Paternal Uncle      Current Outpatient Medications (Cardiovascular):  [name]  Alirocumab (PRALUENT) 150 MG/ML SOAJ, Inject 150 mg into the skin every 14 (fourteen) days. .  metoprolol tartrate (LOPRESSOR) 25 MG tablet, Take 0.5 tablets (12.5 mg total) by mouth 2 (two) times daily. (Patient taking differently: Take 12.5 mg by mouth daily. )  Current Outpatient Medications (Respiratory):  .  loratadine (CLARITIN) 10 MG tablet, Take 10 mg by mouth every evening.   Current Outpatient Medications (Analgesics):  .  aspirin EC 325 MG EC tablet, Take 1 tablet (325 mg total) by mouth daily. (Patient taking differently: Take 325 mg by mouth at bedtime. )   Current Outpatient Medications (Other):  [name]  Ascorbic Acid (VITAMIN C) 1000 MG tablet, Take 2,000 mg by mouth daily. .  Cholecalciferol (VITAMIN D-3) 125 MCG (5000 UT) TABS, Take 5,000 Units by mouth daily. .  hydrocortisone (ANUSOL-HC) 2.5 % rectal cream, Place 1 application rectally 2 (two) times daily. .  Multiple Vitamin (MULTIVITAMIN WITH MINERALS) TABS tablet, Take 1 tablet by mouth daily.    Past medical history, social, surgical and family history all reviewed in electronic medical record.  [name] pertanent information unless stated regarding to the chief complaint.   Review of Systems:   [name] headache, visual changes, nausea, vomiting, diarrhea, constipation, dizziness, abdominal pain, skin rash, fevers, chills, night sweats, weight loss, swollen lymph nodes, body aches, joint swelling, muscle aches, chest pain, shortness of breath, mood changes.   Objective  Pulse (!) 57, height 5\' 6"  (1.676 m), weight 190 lb (86.2 kg), SpO2 98 %.    General: [name] apparent distress alert and oriented x3 mood and affect normal, dressed appropriately.  HEENT: Pupils equal, extraocular movements intact  Respiratory: Patient's speak in full sentences and does not appear short of breath  Cardiovascular: [name] lower extremity edema, non tender, [name] erythema  Skin: Warm dry intact with [name] signs of infection or rash on extremities or on axial skeleton.  Abdomen: Soft nontender  Neuro: Cranial nerves II through XII are intact,  neurovascularly intact in all extremities with 2+ DTRs and 2+ pulses.  Lymph: [name] lymphadenopathy of posterior or anterior cervical chain or axillae bilaterally.  Gait normal with good balance and coordination.  MSK: Moderate arthritic changes of multiple joints Left hand exam shows the patient does have some atrophy of the thenar eminence.  Positive grind test.  4 out of 5 grip strength compared to the contralateral side  Procedure: Real-time Ultrasound Guided Injection of left CMC joint Device: GE Logiq Q7 Ultrasound guided injection is preferred based studies that show increased duration, increased effect, greater accuracy, decreased procedural pain, increased response rate, and decreased cost with ultrasound guided versus blind injection.  Verbal informed consent obtained.  Time-out conducted.  Noted [name] overlying erythema, induration, or other signs of local infection.  Skin prepped in a sterile fashion.  Local anesthesia: Topical Ethyl chloride.  With sterile technique and under real time ultrasound guidance: With a 25-gauge half inch needle injected with 0.5 cc of 0.5% Marcaine  and 0.5 cc of Kenalog 40 mg/mL Completed without difficulty  Pain immediately resolved suggesting accurate placement of the medication.  Advised to call if fevers/chills, erythema, induration, drainage, or persistent bleeding.  Images permanently stored and available for review in the ultrasound unit.  Impression: Technically successful ultrasound guided injection.   Impression and Recommendations:     This case required medical decision making of moderate complexity. The above documentation has been reviewed and is accurate and complete Lyndal Pulley, DO       Note: This dictation was prepared with Dragon dictation along with smaller phrase technology. Any transcriptional errors that result from this process are unintentional.

## 2019-10-28 NOTE — Patient Instructions (Addendum)
Injected CMC joint today See me again in 6 weeks just in case we need to inject tendon

## 2019-11-05 ENCOUNTER — Ambulatory Visit: Payer: Medicare Other | Admitting: Family Medicine

## 2019-11-12 ENCOUNTER — Other Ambulatory Visit: Payer: Self-pay

## 2019-11-12 ENCOUNTER — Ambulatory Visit
Admission: RE | Admit: 2019-11-12 | Discharge: 2019-11-12 | Disposition: A | Discharge status: Home / Self Care | Payer: Medicare PPO | Source: Ambulatory Visit | Attending: Internal Medicine | Admitting: Internal Medicine

## 2019-11-12 DIAGNOSIS — Z1231 Encounter for screening mammogram for malignant neoplasm of breast: Secondary | ICD-10-CM

## 2019-11-22 ENCOUNTER — Encounter: Payer: Self-pay | Admitting: Internal Medicine

## 2019-11-22 DIAGNOSIS — K319 Disease of stomach and duodenum, unspecified: Secondary | ICD-10-CM

## 2019-11-23 ENCOUNTER — Telehealth: Payer: Self-pay | Admitting: Cardiovascular Disease

## 2019-11-23 NOTE — Telephone Encounter (Signed)
Sent a pa in to [place] for repatha since it is preferred

## 2019-11-23 NOTE — Telephone Encounter (Signed)
Pt c/o medication issue:  1. Name of Medication: Alirocumab (PRALUENT) 150 MG/ML SOAJ  2. How are you currently taking this medication (dosage and times per day)? n/a  3. Are you having a reaction (difficulty breathing--STAT)? no  4. What is your medication issue? Patient received a letter in the mail stating that on 11/17/19 the medication was not included on their approved list of medications and she was unable to get a refill.

## 2019-11-26 ENCOUNTER — Telehealth: Payer: Self-pay | Admitting: Cardiovascular Disease

## 2019-11-26 NOTE — Telephone Encounter (Signed)
Jessica [name], with [facility], is calling requesting a prior authorization from Dr. Gwenlyn Found for patient to fill Evolocumab (REPATHA SURECLICK) medication. Please return call at [phone].

## 2019-11-26 NOTE — Telephone Encounter (Signed)
Called [employer] and answered some additional questions for a pa submitted for pt on 11/23/19... will await determination

## 2019-11-27 NOTE — Telephone Encounter (Signed)
Approved! No further action needed

## 2019-11-27 NOTE — Telephone Encounter (Signed)
LMOM; returned call to clarify questions related to [place] PA renewal.   Will follow up on Monday if needed.

## 2019-11-27 NOTE — Telephone Encounter (Signed)
Terri from [facility] was calling to discuss the medication prior authorization with a pharmacist. New phone number provided [phone])

## 2019-12-09 ENCOUNTER — Encounter: Payer: Self-pay | Admitting: Family Medicine

## 2019-12-09 ENCOUNTER — Ambulatory Visit: Payer: Medicare PPO | Admitting: Family Medicine

## 2019-12-09 ENCOUNTER — Other Ambulatory Visit: Payer: Self-pay

## 2019-12-09 ENCOUNTER — Ambulatory Visit: Payer: Self-pay

## 2019-12-09 ENCOUNTER — Telehealth: Payer: Self-pay

## 2019-12-09 VITALS — BP 122/90 | HR 57 | Ht 66.0 in | Wt 197.0 lb

## 2019-12-09 DIAGNOSIS — M79642 Pain in left hand: Secondary | ICD-10-CM

## 2019-12-09 DIAGNOSIS — M19049 Primary osteoarthritis, unspecified hand: Secondary | ICD-10-CM

## 2019-12-09 DIAGNOSIS — M1812 Unilateral primary osteoarthritis of first carpometacarpal joint, left hand: Secondary | ICD-10-CM | POA: Diagnosis not present

## 2019-12-09 NOTE — Patient Instructions (Signed)
Weight lifting gloves with gel pads See me as needed

## 2019-12-09 NOTE — Progress Notes (Signed)
[place] Phone: [phone] Subjective:   Jessica [name], am serving as a scribe for Dr. Hulan Saas. This visit occurred during the SARS-CoV-2 public health emergency.  Safety protocols were in place, including screening questions prior to the visit, additional usage of staff PPE, and extensive cleaning of exam room while observing appropriate contact time as indicated for disinfecting solutions.   I'm seeing this patient by the request  of:  Hoyt Koch, MD  CC: Hand pain  [id number]   10/28/2019 Patient given injection today.  Tolerated the procedure well, discussed possible bracing.  Topical anti-inflammatories, icing regimen.  Patient also had some mild signs on ultrasound that seem to be more of a de Quervain's tenosynovitis and if not improved in 6 weeks I would consider to repeat the injection.  Warned of the possibility of hypopigmentation in the area secondary to the superficial aspect.  Follow-up with me again 6 weeks  Update 12/09/2019 Jessica [name] is a 69 y.o. female coming in with complaint of left thumb pain. Patient states that she is having pain in 3rd, 4th, and 5th proximal phalanx. Thumb is doing better since injection.  Patient states overall though would state most of it is improved.  Has been working out at [employer] on a more regular basis.    Past Medical History:  Diagnosis Date  . Allergy   . Breast cancer ([place]) 2000  . Cancer [facility])    breast  . Colon polyp   . Coronary artery disease   . GERD (gastroesophageal reflux disease)   . History of blood transfusion   . Hyperlipidemia   . Migraine    history of migrarines, none as an adult  . Personal history of radiation therapy 2000   Past Surgical History:  Procedure Laterality Date  . ABDOMINAL HYSTERECTOMY    . BREAST LUMPECTOMY Right 2000  . BREAST SURGERY Right    calcifaction removed  . BUNIONECTOMY  Bilateral   . COLONOSCOPY W/ POLYPECTOMY    . CORONARY ARTERY BYPASS GRAFT N/A 01/05/2019   Procedure: CORONARY ARTERY BYPASS GRAFTING (CABG), using right leg saphenous endoscopic and open vein harvest, exploration left leg;  Surgeon: Gaye Pollack, MD;  Location: [place] [facility];  Service: Open Heart Surgery;  Laterality: N/A;  . EYE SURGERY Bilateral    cataract  . hemorrhoid    . LEFT HEART CATH AND CORONARY ANGIOGRAPHY N/A 01/01/2019   Procedure: LEFT HEART CATH AND CORONARY ANGIOGRAPHY;  Surgeon: Jettie Booze, MD;  Location: [facility];  Service: Cardiovascular;  Laterality: N/A;  . STERNAL WIRES REMOVAL N/A 05/11/2019   Procedure: STERNAL WIRES REMOVAL;  Surgeon: Gaye Pollack, MD;  Location: [place];  Service: Thoracic;  Laterality: N/A;  . TEE WITHOUT CARDIOVERSION N/A 01/05/2019   Procedure: TRANSESOPHAGEAL ECHOCARDIOGRAM (TEE);  Surgeon: Gaye Pollack, MD;  Location: [employer];  Service: Open Heart Surgery;  Laterality: N/A;   Social History   Socioeconomic History  . Marital status: Married    Spouse name: Not on file  . Number of children: Not on file  . Years of education: Not on file  . Highest education level: Master's degree (e.g., MA, MS, MEng, MEd, MSW, MBA)  Occupational History  . Not on file  Tobacco Use  . Smoking status: Never Smoker  . Smokeless tobacco: Never Used  Substance and Sexual Activity  . Alcohol use: [name]  . Drug use:  [name]  . Sexual activity: Yes  Other Topics Concern  . Not on file  Social History Narrative  . Not on file   Social Determinants of Health   Financial Resource Strain:   . Difficulty of Paying Living Expenses: Not on file  Food Insecurity:   . Worried About Charity fundraiser in the Last Year: Not on file  . Ran Out of Food in the Last Year: Not on file  Transportation Needs:   . Lack of Transportation (Medical): Not on file  . Lack of Transportation (Non-Medical): Not on file  Physical Activity:   . Days of Exercise per  Week: Not on file  . Minutes of Exercise per Session: Not on file  Stress:   . Feeling of Stress : Not on file  Social Connections:   . Frequency of Communication with Friends and Family: Not on file  . Frequency of Social Gatherings with Friends and Family: Not on file  . Attends Religious Services: Not on file  . Active Member of Clubs or Organizations: Not on file  . Attends Archivist Meetings: Not on file  . Marital Status: Not on file   Allergies  Allergen Reactions  . Codeine Nausea And Vomiting  . Sulfa Antibiotics Swelling  . Sulfacetamide Swelling   Family History  Problem Relation Age of Onset  . Heart disease Brother   . Asthma Paternal Aunt   . Diabetes Paternal Aunt   . Stroke Paternal Uncle   . Kidney disease Paternal Uncle      Current Outpatient Medications (Cardiovascular):  [name]  Alirocumab (PRALUENT) 150 MG/ML SOAJ, Inject 150 mg into the skin every 14 (fourteen) days. .  metoprolol tartrate (LOPRESSOR) 25 MG tablet, Take 0.5 tablets (12.5 mg total) by mouth 2 (two) times daily. (Patient taking differently: Take 12.5 mg by mouth daily. )  Current Outpatient Medications (Respiratory):  .  loratadine (CLARITIN) 10 MG tablet, Take 10 mg by mouth every evening.   Current Outpatient Medications (Analgesics):  .  aspirin EC 325 MG EC tablet, Take 1 tablet (325 mg total) by mouth daily. (Patient taking differently: Take 325 mg by mouth at bedtime. )   Current Outpatient Medications (Other):  [name]  Ascorbic Acid (VITAMIN C) 1000 MG tablet, Take 2,000 mg by mouth daily. .  Cholecalciferol (VITAMIN D-3) 125 MCG (5000 UT) TABS, Take 5,000 Units by mouth daily. .  hydrocortisone (ANUSOL-HC) 2.5 % rectal cream, Place 1 application rectally 2 (two) times daily. .  Multiple Vitamin (MULTIVITAMIN WITH MINERALS) TABS tablet, Take 1 tablet by mouth daily.   Reviewed prior external information including notes and imaging from  primary care provider As well as  notes that were available from care everywhere and other healthcare systems.  Past medical history, social, surgical and family history all reviewed in electronic medical record.  [name] pertanent information unless stated regarding to the chief complaint.   Review of Systems:  [name] headache, visual changes, nausea, vomiting, diarrhea, constipation, dizziness, abdominal pain, skin rash, fevers, chills, night sweats, weight loss, swollen lymph nodes, body aches, joint swelling, chest pain, shortness of breath, mood changes. POSITIVE muscle aches  Objective  Blood pressure 122/90, pulse (!) 57, height 5\' 6"  (1.676 m), weight 197 lb (89.4 kg), SpO2 99 %.   General: [name] apparent distress alert and oriented x3 mood and affect normal, dressed appropriately.  HEENT: Pupils equal, extraocular movements intact  Respiratory: Patient's speak in full sentences and does not appear short of  breath  Cardiovascular: [name] lower extremity edema, non tender, [name] erythema  Skin: Warm dry intact with [name] signs of infection or rash on extremities or on axial skeleton.  Abdomen: Soft nontender  Neuro: Cranial nerves II through XII are intact, neurovascularly intact in all extremities with 2+ DTRs and 2+ pulses.  Lymph: [name] lymphadenopathy of posterior or anterior cervical chain or axillae bilaterally.  Gait normal with good balance and coordination.  MSK: Left hand exam shows the patient does have some mild arthritic changes.  Some synovitis noted of the first and second MCP joint.  Patient though does have full range of motion.  Minimally tender over the MCPs of the third fourth and fifth.   Limited musculoskeletal ultrasound was performed and interpreted by Lyndal Pulley  Limited ultrasound of patient's hand shows minimal arthritic changes of the MCP of the third fourth and fifth with mild to moderate arthritic changes of the first and second MCP joints.  [name] increase in Doppler flow [name] cortical irregularity.   Impression  and Recommendations:     This case required medical decision making of moderate complexity. The above documentation has been reviewed and is accurate and complete Lyndal Pulley, DO       Note: This dictation was prepared with Dragon dictation along with smaller phrase technology. Any transcriptional errors that result from this process are unintentional.

## 2019-12-09 NOTE — Assessment & Plan Note (Signed)
Mild exacerbation.  Discussed icing regimen and home exercise, which activities to do which wants to avoid.  Patient should do relatively well and follow-up with me again in 3 to 4 weeks if pain is not completely resolved.

## 2019-12-09 NOTE — Telephone Encounter (Signed)
I spoke to pt and informed her that the medical records that she requested from [facility] should be sent to GI. She expressed understanding and would have the records sent to them.

## 2019-12-09 NOTE — Telephone Encounter (Signed)
We don't get any medical records. If she requested them she will need to call whom ever she requested from to see if records have be mailed . Those records would need to go [facility] not [place].They called her and spoke with her and they said she hung up on them. She needs to call [facility] .

## 2019-12-09 NOTE — Assessment & Plan Note (Signed)
Chronic problem but improved 6 weeks after the injection.  We will continue to monitor.  Can repeat injection every 3 to 4 months if necessary

## 2019-12-09 NOTE — Telephone Encounter (Signed)
Patient stopped by the office today wanting to know if her medical records had been received and what the next steps would be to get her scheduled with an GI doctor     Please call and advise

## 2019-12-14 ENCOUNTER — Telehealth: Payer: Self-pay | Admitting: Cardiovascular Disease

## 2019-12-14 MED ORDER — REPATHA SURECLICK 140 MG/ML ~~LOC~~ SOAJ: 140.0000 mg | SUBCUTANEOUS | 11 refills | Status: DC

## 2019-12-14 NOTE — Telephone Encounter (Signed)
   Pt c/o medication issue:  1. Name of Medication: Alirocumab (PRALUENT) 150 MG/ML SOAJ  2. How are you currently taking this medication (dosage and times per day)? As directed  3. Are you having a reaction (difficulty breathing--STAT)?  NO  4. What is your medication issue? Patient states since her insurance has changed to [facility] they will not approve her to take Praluent. However, they will approve for her to take Repatha and she would like to know if it would be okay to switch and if so, can a prescription be called into [facility]

## 2019-12-14 NOTE — Telephone Encounter (Signed)
Called and got the rx sent for repatha and cancelled praluent as the pt is approved from [place] for repatha til 10/21/20, pt voiced understanding.

## 2019-12-22 ENCOUNTER — Encounter: Payer: Self-pay | Admitting: Cardiovascular Disease

## 2019-12-22 ENCOUNTER — Ambulatory Visit: Payer: Medicare PPO | Admitting: Cardiovascular Disease

## 2019-12-22 ENCOUNTER — Other Ambulatory Visit: Payer: Self-pay

## 2019-12-22 VITALS — BP 120/80 | HR 50 | Temp 98.1°F | Ht 66.5 in | Wt 191.0 lb

## 2019-12-22 DIAGNOSIS — Z951 Presence of aortocoronary bypass graft: Secondary | ICD-10-CM | POA: Diagnosis not present

## 2019-12-22 DIAGNOSIS — I739 Peripheral vascular disease, unspecified: Secondary | ICD-10-CM

## 2019-12-22 DIAGNOSIS — I25810 Atherosclerosis of coronary artery bypass graft(s) without angina pectoris: Secondary | ICD-10-CM

## 2019-12-22 DIAGNOSIS — E782 Mixed hyperlipidemia: Secondary | ICD-10-CM | POA: Diagnosis not present

## 2019-12-22 DIAGNOSIS — R0602 Shortness of breath: Secondary | ICD-10-CM | POA: Diagnosis not present

## 2019-12-22 MED ORDER — METOPROLOL TARTRATE 25 MG PO TABS: 12.5000 mg | ORAL_TABLET | Freq: Every day | ORAL | 11 refills | Status: DC

## 2019-12-22 NOTE — Assessment & Plan Note (Signed)
History of CAD status post CABG x5 by Dr. Cyndia Bent 01/05/2019 with a LIMA to LAD, vein to diagonal branch, obtuse marginal branches 1 and 2 sequentially and RCA.  She did however have sternal rewiring because of dehiscence after that.  Her major complaint is of dyspnea on exertion.  She denies chest pain.  I am to get a 2D echo to further evaluate

## 2019-12-22 NOTE — Patient Instructions (Addendum)
Medication Instructions:  Decrease Lopressor to 12.5mg  once daily.   If you need a refill on your cardiac medications before your next appointment, please call your pharmacy.   Lab work: NONE  Testing/Procedures: Your physician has requested that you have an echocardiogram. Echocardiography is a painless test that uses sound waves to create images of your heart. It provides your doctor with information about the size and shape of your heart and how well your heart's chambers and valves are working. This procedure takes approximately one hour. There are no restrictions for this procedure. [place] has requested that you have a lower extremity arterial exercise duplex. During this test, exercise and ultrasound are used to evaluate arterial blood flow in the legs. Allow one hour for this exam. There are no restrictions or special instructions.  AND  Your physician has requested that you have an ankle brachial index (ABI). During this test an ultrasound and blood pressure cuff are used to evaluate the arteries that supply the arms and legs with blood. Allow thirty minutes for this exam. There are no restrictions or special instructions.  Follow-Up: At [facility], you and your health needs are our priority.  As part of our continuing mission to provide you with exceptional heart care, we have created designated Provider Care Teams.  These Care Teams include your primary Cardiologist (physician) and Advanced Practice Providers (APPs -  Physician Assistants and Nurse Practitioners) who all work together to provide you with the care you need, when you need it. You may see Quay Burow, MD or one of the following Advanced Practice Providers on your designated Care Team:    Kerin Ransom, PA-C  [place], [place]  Coletta Memos, [place]  Your physician wants you to follow-up in: 6 months with Dr. Gwenlyn Found. You will receive a reminder letter in the mail  two months in advance. If you don't receive a letter, please call our office to schedule the follow-up appointment.   You have been referred to [facility]. They will be in contact.

## 2019-12-22 NOTE — Assessment & Plan Note (Signed)
Jessica [name] complains of bilateral calf claudication with ambulation.  I am going to get lower extremity Dopplers to further evaluate.

## 2019-12-22 NOTE — Assessment & Plan Note (Signed)
History of hyperlipidemia Not tolerant to statin drugs on Repatha with an excellent lipid profile

## 2019-12-22 NOTE — Progress Notes (Signed)
12/22/2019 Jessica [name]   1950-12-09  [id number]  Primary Physician Jessica Koch, MD Primary Cardiologist: Jessica Harp MD Jessica [name]  HPI:  Jessica [name] is a 69 y.o.  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 She presented to [facility] on 12/31/2018 with unstable angina and underwent cardiac catheterization by Jessica [name] the following day revealing three-vessel disease with preserved LV function. On 01/05/2019 she underwent CABG x5 by Jessica [name] the LIMA to her LAD, vein to diagonal branch, RCA and obtuse marginal branches 1 and 2 sequentially. She is been complaining of some musculoskeletal pains at home with some mild shortness of breath. She did not participate in cardiac rehab because of COVID-19. She was put on high-dose statin therapy which she was intolerant to all that responsive to.    Since last saw her 3 months ago she continues to do well.  Her Praluent was changed to [place] because of insurance issues.  Her major complaint is dyspnea on exertion.  She also complains of bilateral calf claudication.  She denies chest pain.    Current Meds  Medication Sig  . Ascorbic Acid (VITAMIN C) 1000 MG tablet Take 2,000 mg by mouth daily.  [name] aspirin EC 325 MG EC tablet Take 1 tablet (325 mg total) by mouth daily. (Patient taking differently: Take 325 mg by mouth at bedtime. )  . Cholecalciferol (VITAMIN D-3) 125 MCG (5000 UT) TABS Take 5,000 Units by mouth daily.  . Evolocumab (REPATHA SURECLICK) [id number] MG/ML SOAJ Inject 140 mg into the skin every 14 (fourteen) days.  . hydrocortisone (ANUSOL-HC) 2.5 % rectal cream Place 1 application rectally 2 (two) times daily.  [name] loratadine (CLARITIN) 10 MG tablet Take 10 mg by mouth every evening.   . metoprolol tartrate (LOPRESSOR) 25 MG tablet Take 0.5 tablets  (12.5 mg total) by mouth 2 (two) times daily. (Patient taking differently: Take 12.5 mg by mouth daily. )  . Multiple Vitamin (MULTIVITAMIN WITH MINERALS) TABS tablet Take 1 tablet by mouth daily.     Allergies  Allergen Reactions  . Codeine Nausea And Vomiting  . Sulfa Antibiotics Swelling  . Sulfacetamide Swelling    Social History   Socioeconomic History  . Marital status: Married    Spouse name: Not on file  . Number of children: Not on file  . Years of education: Not on file  . Highest education level: Master's degree (e.g., MA, MS, MEng, MEd, MSW, MBA)  Occupational History  . Not on file  Tobacco Use  . Smoking status: Never Smoker  . Smokeless tobacco: Never Used  Substance and Sexual Activity  . Alcohol use: No  . Drug use: No  . Sexual activity: Yes  Other Topics Concern  . Not on file  Social History Narrative  . Not on file   Social Determinants of Health   Financial Resource Strain:   . Difficulty of Paying Living Expenses: Not on file  Food Insecurity:   . Worried About Charity fundraiser in the Last Year: Not on file  . Ran Out of Food in the Last Year: Not on file  Transportation Needs:   . [name] of Transportation (Medical): Not on file  . [name] of Transportation (Non-Medical): Not on file  Physical Activity:   . Days of Exercise per Week: Not on file  .  Minutes of Exercise per Session: Not on file  Stress:   . Feeling of Stress : Not on file  Social Connections:   . Frequency of Communication with Friends and Family: Not on file  . Frequency of Social Gatherings with Friends and Family: Not on file  . Attends Religious Services: Not on file  . Active Member of Clubs or Organizations: Not on file  . Attends Archivist Meetings: Not on file  . Marital Status: Not on file  Intimate Partner Violence:   . Fear of Current or Ex-Partner: Not on file  . Emotionally Abused: Not on file  . Physically Abused: Not on file  . Sexually Abused:  Not on file     Review of Systems: General: negative for chills, fever, night sweats or weight changes.  Cardiovascular: negative for chest pain, dyspnea on exertion, edema, orthopnea, palpitations, paroxysmal nocturnal dyspnea or shortness of breath Dermatological: negative for rash Respiratory: negative for cough or wheezing Urologic: negative for hematuria Abdominal: negative for nausea, vomiting, diarrhea, bright red blood per rectum, melena, or hematemesis Neurologic: negative for visual changes, syncope, or dizziness All other systems reviewed and are otherwise negative except as noted above.    Blood pressure 120/80, pulse (!) 50, temperature 98.1 F (36.7 C), height 5' 6.5" (1.689 m), weight 191 lb (86.6 kg).  General appearance: alert and no distress Neck: no adenopathy, no carotid bruit, no JVD, supple, symmetrical, trachea midline and thyroid not enlarged, symmetric, no tenderness/mass/nodules Lungs: clear to auscultation bilaterally Heart: regular rate and rhythm, S1, S2 normal, no murmur, click, rub or gallop Extremities: extremities normal, atraumatic, no cyanosis or edema Pulses: 2+ and symmetric Skin: Skin color, texture, turgor normal. No rashes or lesions Neurologic: Alert and oriented X 3, normal strength and tone. Normal symmetric reflexes. Normal coordination and gait  EKG sinus bradycardia 50 with small inferior Q waves and poor R wave progression.  I personally reviewed this EKG.  ASSESSMENT AND PLAN:   S/P CABG x 5 History of CAD status post CABG x5 by Jessica [name] 01/05/2019 with a LIMA to LAD, vein to diagonal branch, obtuse marginal branches 1 and 2 sequentially and RCA.  She did however have sternal rewiring because of dehiscence after that.  Her major complaint is of dyspnea on exertion.  She denies chest pain.  I am to get a 2D echo to further evaluate  Hyperlipidemia History of hyperlipidemia Not tolerant to statin drugs on Repatha with an excellent lipid  profile  Claudication in peripheral vascular disease ([place]) Jessica [name] complains of bilateral calf claudication with ambulation.  I am going to get lower extremity Dopplers to further evaluate.      Jessica Harp MD FACP,FACC,FAHA, [facility] 12/22/2019 11:06 AM

## 2019-12-23 ENCOUNTER — Telehealth (HOSPITAL_COMMUNITY): Payer: Self-pay | Admitting: *Deleted

## 2019-12-23 NOTE — Telephone Encounter (Signed)
Referral for Cardiac rehab received from Dr. Gwenlyn Found for Phase III/maintenance.  Noted that Jessica [name] lives in [place].  Prior referral sent to [facility] post hospitalization in march 2020. Called and spoke to Jessica [name] regarding her preference to participate here at [facility] or [facility].  Jessica [name] preferred to return to [facility].  Advised Jessica [name] that there may be a delay.  Cardiac rehab staff were deployed to other units to support the surge of Covid-19 patients. Jessica [name] verbalized understanding and will continue to walk for exercise 30 minutes twice a day. Will route the referral to [facility] staff and send message. Cherre Huger, BSN Cardiac and Training and development officer

## 2019-12-24 ENCOUNTER — Other Ambulatory Visit: Payer: Self-pay | Admitting: *Deleted

## 2019-12-24 DIAGNOSIS — R0609 Other forms of dyspnea: Secondary | ICD-10-CM

## 2019-12-28 ENCOUNTER — Other Ambulatory Visit: Payer: Self-pay

## 2019-12-28 ENCOUNTER — Encounter: Payer: Medicare PPO | Attending: Cardiovascular Disease

## 2019-12-28 DIAGNOSIS — I2510 Atherosclerotic heart disease of native coronary artery without angina pectoris: Secondary | ICD-10-CM | POA: Insufficient documentation

## 2019-12-28 DIAGNOSIS — E785 Hyperlipidemia, unspecified: Secondary | ICD-10-CM | POA: Insufficient documentation

## 2019-12-28 DIAGNOSIS — Z79899 Other long term (current) drug therapy: Secondary | ICD-10-CM | POA: Insufficient documentation

## 2019-12-28 DIAGNOSIS — Z7982 Long term (current) use of aspirin: Secondary | ICD-10-CM | POA: Insufficient documentation

## 2019-12-28 DIAGNOSIS — Z853 Personal history of malignant neoplasm of breast: Secondary | ICD-10-CM | POA: Insufficient documentation

## 2019-12-28 DIAGNOSIS — Z923 Personal history of irradiation: Secondary | ICD-10-CM | POA: Insufficient documentation

## 2019-12-28 DIAGNOSIS — R0600 Dyspnea, unspecified: Secondary | ICD-10-CM | POA: Insufficient documentation

## 2019-12-28 DIAGNOSIS — R0609 Other forms of dyspnea: Secondary | ICD-10-CM

## 2019-12-28 DIAGNOSIS — K219 Gastro-esophageal reflux disease without esophagitis: Secondary | ICD-10-CM | POA: Insufficient documentation

## 2019-12-28 NOTE — Progress Notes (Signed)
Virtual Orientation performed. Patient informed when to come in for RD and EP orientation. Diagnosis can be found in [facility] 12/22/2019

## 2019-12-29 ENCOUNTER — Ambulatory Visit (HOSPITAL_COMMUNITY)
Admission: RE | Admit: 2019-12-29 | Discharge: 2019-12-29 | Disposition: A | Discharge status: Home / Self Care | Payer: Medicare PPO | Source: Ambulatory Visit | Attending: Internal Medicine | Admitting: Internal Medicine

## 2019-12-29 DIAGNOSIS — I25810 Atherosclerosis of coronary artery bypass graft(s) without angina pectoris: Secondary | ICD-10-CM | POA: Diagnosis not present

## 2019-12-30 ENCOUNTER — Other Ambulatory Visit: Payer: Self-pay

## 2019-12-30 ENCOUNTER — Ambulatory Visit: Payer: Medicare PPO | Admitting: Family Medicine

## 2019-12-30 ENCOUNTER — Encounter: Payer: Self-pay | Admitting: Family Medicine

## 2019-12-30 DIAGNOSIS — L818 Other specified disorders of pigmentation: Secondary | ICD-10-CM | POA: Diagnosis not present

## 2019-12-30 DIAGNOSIS — M1812 Unilateral primary osteoarthritis of first carpometacarpal joint, left hand: Secondary | ICD-10-CM | POA: Diagnosis not present

## 2019-12-30 NOTE — Progress Notes (Signed)
[place] Phone: [phone] Subjective:   Jessica [name], am serving as a scribe for Dr. Hulan Saas. This visit occurred during the SARS-CoV-2 public health emergency.  Safety protocols were in place, including screening questions prior to the visit, additional usage of staff PPE, and extensive cleaning of exam room while observing appropriate contact time as indicated for disinfecting solutions.   I'm seeing this patient by the request  of:  Hoyt Koch, MD  CC: Left thumb pain follow-up  [id number]   12/09/2019  Chronic problem but improved 6 weeks after the injection.  We will continue to monitor.  Can repeat injection every 3 to 4 months if necessary  Update 12/30/2019 Jessica [name] is a 69 y.o. female coming in with complaint of left thumb discoloration. Is not having pain but wants to know if discoloration will spread to entire hand.  Patient states that since injection it seems to be getting a little worse.  Was not having before the injection.    Past Medical History:  Diagnosis Date  . Allergy   . Breast cancer ([place]) 2000  . Cancer [facility])    breast  . Colon polyp   . Coronary artery disease   . GERD (gastroesophageal reflux disease)   . History of blood transfusion   . Hyperlipidemia   . Migraine    history of migrarines, none as an adult  . Personal history of radiation therapy 2000   Past Surgical History:  Procedure Laterality Date  . ABDOMINAL HYSTERECTOMY    . BREAST LUMPECTOMY Right 2000  . BREAST SURGERY Right    calcifaction removed  . BUNIONECTOMY Bilateral   . COLONOSCOPY W/ POLYPECTOMY    . CORONARY ARTERY BYPASS GRAFT N/A 01/05/2019   Procedure: CORONARY ARTERY BYPASS GRAFTING (CABG), using right leg saphenous endoscopic and open vein harvest, exploration left leg;  Surgeon: Gaye Pollack, MD;  Location: [facility];  Service: Open Heart Surgery;  Laterality:  N/A;  . EYE SURGERY Bilateral    cataract  . hemorrhoid    . LEFT HEART CATH AND CORONARY ANGIOGRAPHY N/A 01/01/2019   Procedure: LEFT HEART CATH AND CORONARY ANGIOGRAPHY;  Surgeon: Jettie Booze, MD;  Location: [facility];  Service: Cardiovascular;  Laterality: N/A;  . STERNAL WIRES REMOVAL N/A 05/11/2019   Procedure: STERNAL WIRES REMOVAL;  Surgeon: Gaye Pollack, MD;  Location: [facility];  Service: Thoracic;  Laterality: N/A;  . TEE WITHOUT CARDIOVERSION N/A 01/05/2019   Procedure: TRANSESOPHAGEAL ECHOCARDIOGRAM (TEE);  Surgeon: Gaye Pollack, MD;  Location: [facility];  Service: Open Heart Surgery;  Laterality: N/A;   Social History   Socioeconomic History  . Marital status: Married    Spouse name: Not on file  . Number of children: Not on file  . Years of education: Not on file  . Highest education level: Master's degree (e.g., MA, MS, MEng, MEd, MSW, MBA)  Occupational History  . Not on file  Tobacco Use  . Smoking status: Never Smoker  . Smokeless tobacco: Never Used  Substance and Sexual Activity  . Alcohol use: [name]  . Drug use: [name]  . Sexual activity: Yes  Other Topics Concern  . Not on file  Social History Narrative  . Not on file   Social Determinants of Health   Financial Resource Strain:   . Difficulty of Paying Living Expenses: Not on file  Food Insecurity:   .  Worried About Charity fundraiser in the Last Year: Not on file  . Ran Out of Food in the Last Year: Not on file  Transportation Needs:   . Lack of Transportation (Medical): Not on file  . Lack of Transportation (Non-Medical): Not on file  Physical Activity:   . Days of Exercise per Week: Not on file  . Minutes of Exercise per Session: Not on file  Stress:   . Feeling of Stress : Not on file  Social Connections:   . Frequency of Communication with Friends and Family: Not on file  . Frequency of Social Gatherings with Friends and Family: Not on file  . Attends Religious Services: Not on file   . Active Member of Clubs or Organizations: Not on file  . Attends Archivist Meetings: Not on file  . Marital Status: Not on file   Allergies  Allergen Reactions  . Codeine Nausea And Vomiting  . Sulfa Antibiotics Swelling  . Sulfacetamide Swelling   Family History  Problem Relation Age of Onset  . Heart disease Brother   . Asthma Paternal Aunt   . Diabetes Paternal Aunt   . Stroke Paternal Uncle   . Kidney disease Paternal Uncle      Current Outpatient Medications (Cardiovascular):  [name]  Evolocumab (REPATHA SURECLICK) [id number] MG/ML SOAJ, Inject 140 mg into the skin every 14 (fourteen) days. .  metoprolol tartrate (LOPRESSOR) 25 MG tablet, Take 0.5 tablets (12.5 mg total) by mouth daily.  Current Outpatient Medications (Respiratory):  .  loratadine (CLARITIN) 10 MG tablet, Take 10 mg by mouth every evening.   Current Outpatient Medications (Analgesics):  .  aspirin EC 325 MG EC tablet, Take 1 tablet (325 mg total) by mouth daily. (Patient taking differently: Take 325 mg by mouth at bedtime. )   Current Outpatient Medications (Other):  [name]  Ascorbic Acid (VITAMIN C) 1000 MG tablet, Take 2,000 mg by mouth daily. .  Cholecalciferol (VITAMIN D-3) 125 MCG (5000 UT) TABS, Take 5,000 Units by mouth daily. .  hydrocortisone (ANUSOL-HC) 2.5 % rectal cream, Place 1 application rectally 2 (two) times daily. .  Multiple Vitamin (MULTIVITAMIN WITH MINERALS) TABS tablet, Take 1 tablet by mouth daily.   Reviewed prior external information including notes and imaging from  primary care provider As well as notes that were available from care everywhere and other healthcare systems.  Past medical history, social, surgical and family history all reviewed in electronic medical record.  [name] pertanent information unless stated regarding to the chief complaint.   Review of Systems:  [name] headache, visual changes, nausea, vomiting, diarrhea, constipation, dizziness, abdominal pain, skin rash,  fevers, chills, night sweats, weight loss, swollen lymph nodes, body aches, joint swelling, chest pain, shortness of breath, mood changes. POSITIVE muscle aches  Objective  Blood pressure 124/84, pulse 62, height 5' 6.5" (1.689 m), weight 190 lb (86.2 kg), SpO2 98 %.   General: [name] apparent distress alert and oriented x3 mood and affect normal, dressed appropriately.  HEENT: Pupils equal, extraocular movements intact  Gait normal with good balance and coordination.  MSK: Only evaluated patient's left thumb.  Negative grind test today.  Still some mild atrophy of the musculature compared to the contralateral side.  Does have hypopigmentation surrounding the area that would be consistent with a hypopigmentation that would be from the steroid injection.    Impression and Recommendations:      The above documentation has been reviewed and is accurate and complete Jessica [name]  Jessica Julian, DO       Note: This dictation was prepared with Dragon dictation along with smaller phrase technology. Any transcriptional errors that result from this process are unintentional.

## 2019-12-30 NOTE — Assessment & Plan Note (Signed)
Patient doing significantly better but is having worsening hypopigmentation.  We discussed that this will revert if it is secondary to the injection.  Do not feel that we need to do anything else different at the moment.  Patient is in agreement with the plan.  Follow-up again as needed

## 2019-12-30 NOTE — Assessment & Plan Note (Signed)
Left thumb likely secondary to the steroid side effect

## 2020-01-04 ENCOUNTER — Other Ambulatory Visit: Payer: Self-pay

## 2020-01-04 ENCOUNTER — Encounter: Payer: Medicare PPO | Admitting: *Deleted

## 2020-01-04 VITALS — Ht 66.75 in | Wt 190.2 lb

## 2020-01-04 DIAGNOSIS — E785 Hyperlipidemia, unspecified: Secondary | ICD-10-CM | POA: Diagnosis not present

## 2020-01-04 DIAGNOSIS — K219 Gastro-esophageal reflux disease without esophagitis: Secondary | ICD-10-CM | POA: Diagnosis not present

## 2020-01-04 DIAGNOSIS — Z853 Personal history of malignant neoplasm of breast: Secondary | ICD-10-CM | POA: Diagnosis not present

## 2020-01-04 DIAGNOSIS — Z7982 Long term (current) use of aspirin: Secondary | ICD-10-CM | POA: Diagnosis not present

## 2020-01-04 DIAGNOSIS — R0600 Dyspnea, unspecified: Secondary | ICD-10-CM | POA: Diagnosis not present

## 2020-01-04 DIAGNOSIS — I2510 Atherosclerotic heart disease of native coronary artery without angina pectoris: Secondary | ICD-10-CM | POA: Diagnosis not present

## 2020-01-04 DIAGNOSIS — Z79899 Other long term (current) drug therapy: Secondary | ICD-10-CM | POA: Diagnosis not present

## 2020-01-04 DIAGNOSIS — Z923 Personal history of irradiation: Secondary | ICD-10-CM | POA: Diagnosis not present

## 2020-01-04 DIAGNOSIS — R0609 Other forms of dyspnea: Secondary | ICD-10-CM

## 2020-01-04 NOTE — Progress Notes (Signed)
Pulmonary Individual Treatment Plan  Patient Details  Name: Jessica [name] MRN: [id number] Date of Birth: [birth year] Referring Provider:     Pulmonary Rehab from 01/04/2020 in [facility] Cardiac and Pulmonary Rehab  Referring Provider  Quay Burow MD      Initial Encounter Date:    Pulmonary Rehab from 01/04/2020 in [facility] Cardiac and Pulmonary Rehab  Date  01/04/20      Visit Diagnosis: Dyspnea on exertion  Patient's Home Medications on Admission:  Current Outpatient Medications:  .  Ascorbic Acid (VITAMIN C) 1000 MG tablet, Take 2,000 mg by mouth daily., Disp: , Rfl:  .  aspirin EC 325 MG EC tablet, Take 1 tablet (325 mg total) by mouth daily. (Patient taking differently: Take 325 mg by mouth at bedtime. ), Disp: , Rfl: 0 .  Cholecalciferol (VITAMIN D-3) 125 MCG (5000 UT) TABS, Take 5,000 Units by mouth daily., Disp: , Rfl:  .  Evolocumab (REPATHA SURECLICK) 277 MG/ML SOAJ, Inject 140 mg into the skin every 14 (fourteen) days., Disp: 2 pen, Rfl: 11 .  hydrocortisone (ANUSOL-HC) 2.5 % rectal cream, Place 1 application rectally 2 (two) times daily., Disp: 30 g, Rfl: 6 .  loratadine (CLARITIN) 10 MG tablet, Take 10 mg by mouth every evening. , Disp: , Rfl:  .  metoprolol tartrate (LOPRESSOR) 25 MG tablet, Take 0.5 tablets (12.5 mg total) by mouth daily., Disp: 30 tablet, Rfl: 11 .  Multiple Vitamin (MULTIVITAMIN WITH MINERALS) TABS tablet, Take 1 tablet by mouth daily., Disp: , Rfl:   Past Medical History: Past Medical History:  Diagnosis Date  . Allergy   . Breast cancer ([place]) 2000  . [facility])    breast  . Colon polyp   . Coronary artery disease   . GERD (gastroesophageal reflux disease)   . History of blood transfusion   . Hyperlipidemia   . Migraine    history of migrarines, none as an adult  . Personal history of radiation therapy 2000    Tobacco Use: Social History   Tobacco Use  Smoking Status Never Smoker  Smokeless Tobacco Never Used    Labs: Recent  Review Flowsheet Data    Labs for ITP Cardiac and Pulmonary Rehab Latest Ref Rng & Units 01/05/2019 01/05/2019 03/12/2019 06/16/2019 09/21/2019   Cholestrol 0 - 200 mg/dL - - 113 200 115   LDLCALC 0 - 99 mg/dL - - 63 145(H) 62   HDL >39.00 mg/dL - - 33(L) 33.20(L) 37.90(L)   Trlycerides 0.0 - 149.0 mg/dL - - 83 109.0 76.0   Hemoglobin A1c 4.6 - 6.5 % - - - 6.4 5.9   PHART 7.350 - 7.450 7.315(L) 7.265(L) - - -   PCO2ART 32.0 - 48.0 mmHg 47.1 49.9(H) - - -   HCO3 20.0 - 28.0 mmol/L 23.9 22.6 - - -   TCO2 22 - 32 mmol/L 25 24 - - -   ACIDBASEDEF 0.0 - 2.0 mmol/L 2.0 4.0(H) - - -   O2SAT % 97.0 98.0 - - -       Pulmonary Assessment Scores: Pulmonary Assessment Scores    Row Name 01/04/20 1251         ADL UCSD   ADL Phase  Entry     SOB Score total  9     Rest  0     Walk  1     Stairs  4     Bath  0     Dress  0  Shop  0       CAT Score   CAT Score  7       mMRC Score   mMRC Score  1        UCSD: Self-administered rating of dyspnea associated with activities of daily living (ADLs) 6-point scale (0 = "not at all" to 5 = "maximal or unable to do because of breathlessness")  Scoring Scores range from 0 to 120.  Minimally important difference is 5 units  CAT: CAT can identify the health impairment of COPD patients and is better correlated with disease progression.  CAT has a scoring range of zero to 40. The CAT score is classified into four groups of low (less than 10), medium (10 - 20), high (21-30) and very high (31-40) based on the impact level of disease on health status. A CAT score over 10 suggests significant symptoms.  A worsening CAT score could be explained by an exacerbation, poor medication adherence, poor inhaler technique, or progression of COPD or comorbid conditions.  CAT MCID is 2 points  mMRC: mMRC (Modified Medical Research Council) Dyspnea Scale is used to assess the degree of baseline functional disability in patients of respiratory disease due to  dyspnea. No minimal important difference is established. A decrease in score of 1 point or greater is considered a positive change.   Pulmonary Function Assessment: Pulmonary Function Assessment - 12/28/19 1410      Breath   Shortness of Breath  Yes;Limiting activity       Exercise Target Goals: Exercise Program Goal: Individual exercise prescription set using results from initial 6 min walk test and THRR while considering  patient's activity barriers and safety.   Exercise Prescription Goal: Initial exercise prescription builds to 30-45 minutes a day of aerobic activity, 2-3 days per week.  Home exercise guidelines will be given to patient during program as part of exercise prescription that the participant will acknowledge.  Activity Barriers & Risk Stratification: Activity Barriers & Cardiac Risk Stratification - 01/04/20 1227      Activity Barriers & Cardiac Risk Stratification   Activity Barriers  Muscular Weakness;Deconditioning;Shortness of Breath       6 Minute Walk: 6 Minute Walk    Row Name 08/17/19 0822 08/28/19 0823 01/04/20 1225     6 Minute Walk   Phase  Discharge  Discharge  Initial   Distance  1800 feet  1950 feet  1635 feet   Distance % Change  2 %  10.7 %  --   Distance Feet Change  30 ft  180 ft  --   Walk Time  6 minutes  6 minutes  6 minutes   # of Rest Breaks  0  0  0   MPH  3.41  3.69  2.95   METS  3.78  4.42  3.2   RPE  13  15  11    Perceived Dyspnea   --  --  0   VO2 Peak  13.24  15.5  11.2   Symptoms  No  No  No   Resting HR  67 bpm  73 bpm  69 bpm   Resting BP  110/72  122/64  128/70   Resting Oxygen Saturation   --  --  97 %   Exercise Oxygen Saturation  during 6 min walk  --  --  97 %   Max Ex. HR  102 bpm  134 bpm  92 bpm   Max Ex. BP  138/80  144/60  134/70   2 Minute Post BP  --  --  124/70     Interval HR   1 Minute HR  --  --  79   2 Minute HR  --  --  86   3 Minute HR  --  --  82   4 Minute HR  --  --  89   5 Minute HR  --  --   91   6 Minute HR  --  --  92   2 Minute Post HR  --  --  68   Interval Heart Rate?  --  --  Yes     Interval Oxygen   Interval Oxygen?  --  --  Yes   Baseline Oxygen Saturation %  --  --  97 %   1 Minute Oxygen Saturation %  --  --  97 %   1 Minute Liters of Oxygen  --  --  0 L Room Air   2 Minute Oxygen Saturation %  --  --  95 %   2 Minute Liters of Oxygen  --  --  0 L   3 Minute Oxygen Saturation %  --  --  97 %   3 Minute Liters of Oxygen  --  --  0 L   4 Minute Oxygen Saturation %  --  --  97 %   4 Minute Liters of Oxygen  --  --  0 L   5 Minute Oxygen Saturation %  --  --  97 %   5 Minute Liters of Oxygen  --  --  0 L   6 Minute Oxygen Saturation %  --  --  97 %   6 Minute Liters of Oxygen  --  --  0 L   2 Minute Post Oxygen Saturation %  --  --  98 %   2 Minute Post Liters of Oxygen  --  --  0 L     Oxygen Initial Assessment: Oxygen Initial Assessment - 12/28/19 1409      Home Oxygen   Home Oxygen Device  None    Sleep Oxygen Prescription  None    Home Exercise Oxygen Prescription  None    Home at Rest Exercise Oxygen Prescription  None      Initial 6 min Walk   Oxygen Used  None      Program Oxygen Prescription   Program Oxygen Prescription  None      Intervention   Short Term Goals  To learn and understand importance of monitoring SPO2 with pulse oximeter and demonstrate accurate use of the pulse oximeter.;To learn and understand importance of maintaining oxygen saturations>88%;To learn and demonstrate proper pursed lip breathing techniques or other breathing techniques.    Long  Term Goals  Verbalizes importance of monitoring SPO2 with pulse oximeter and return demonstration;Maintenance of O2 saturations>88%;Compliance with respiratory medication;Exhibits proper breathing techniques, such as pursed lip breathing or other method taught during program session       Oxygen Re-Evaluation:   Oxygen Discharge (Final Oxygen Re-Evaluation):   Initial Exercise  Prescription: Initial Exercise Prescription - 01/04/20 1200      Date of Initial Exercise RX and Referring Provider   Date  01/04/20    Referring Provider  Quay Burow MD      Treadmill   MPH  2.5    Grade  0.5    Minutes  15    METs  3.09  Recumbant Bike   Level  3    RPM  50    Watts  32    Minutes  15    METs  3      Arm Ergometer   Level  2    Watts  42    RPM  25    Minutes  15    METs  3      Elliptical   Level  1    Speed  3    Minutes  15    METs  3      Prescription Details   Frequency (times per week)  3    Duration  Progress to 30 minutes of continuous aerobic without signs/symptoms of physical distress      Intensity   THRR 40-80% of Max Heartrate  102-135    Ratings of Perceived Exertion  11-13    Perceived Dyspnea  0-4      Progression   Progression  Continue to progress workloads to maintain intensity without signs/symptoms of physical distress.      Resistance Training   Training Prescription  Yes    Weight  3 lb    Reps  10-15       Perform Capillary Blood Glucose checks as needed.  Exercise Prescription Changes: Exercise Prescription Changes    Row Name 07/10/19 1200 07/21/19 1400 08/04/19 1100 08/20/19 1400 01/04/20 1200     Response to Exercise   Blood Pressure (Admit)  106/60  128/74  114/66  120/68  128/70   Blood Pressure (Exercise)  128/64  136/70  124/70  126/78  134/70   Blood Pressure (Exit)  108/60  130/64  118/70  98/60  112/64   Heart Rate (Admit)  99 bpm  83 bpm  72 bpm  61 bpm  69 bpm   Heart Rate (Exercise)  108 bpm  106 bpm  93 bpm  96 bpm  92 bpm   Heart Rate (Exit)  66 bpm  80 bpm  68 bpm  76 bpm  65 bpm   Oxygen Saturation (Admit)  --  --  --  --  97 %   Oxygen Saturation (Exercise)  --  --  --  --  95 %   Oxygen Saturation (Exit)  --  --  --  --  98 %   Rating of Perceived Exertion (Exercise)  15  13  13  13  11    Perceived Dyspnea (Exercise)  --  --  --  --  0   Symptoms  none  none  none  none  none    Comments  --  --  --  --  walk test results   Duration  Continue with 30 min of aerobic exercise without signs/symptoms of physical distress.  Continue with 30 min of aerobic exercise without signs/symptoms of physical distress.  Continue with 30 min of aerobic exercise without signs/symptoms of physical distress.  Continue with 30 min of aerobic exercise without signs/symptoms of physical distress.  --   Intensity  THRR unchanged  THRR unchanged  THRR unchanged  THRR unchanged  --     Progression   Progression  Continue to progress workloads to maintain intensity without signs/symptoms of physical distress.  Continue to progress workloads to maintain intensity without signs/symptoms of physical distress.  Continue to progress workloads to maintain intensity without signs/symptoms of physical distress.  Continue to progress workloads to maintain intensity without signs/symptoms of physical distress.  --  Average METs  4.65 on TM  2.91  2.35  3.6  --     Resistance Training   Training Prescription  Yes  Yes  Yes  Yes  --   Weight  4 lb  4 lbs  4 lbs  4 lb  --   Reps  10-15  10-15  10-15  10-15  --     Interval Training   Interval Training  No  No  No  No  --     Treadmill   MPH  3.5  3.5  --  3.5  --   Grade  2  0.5  --  2  --   Minutes  15  15  --  15  --   METs  4.64  3.92  --  4.65  --     NuStep   Level  4  4  4  4   --   SPM  80  --  --  --  --   Minutes  15  15  15  15   --   METs  2.9  2.4  2.8  2.5  --     Recumbant Elliptical   Level  2  2  2   --  --   RPM  50  --  --  --  --   Minutes  15  15  15   --  --   METs  2.1  2.1  2.1  --  --     Home Exercise Plan   Plans to continue exercise at  Home (comment) walking, weights  Home (comment) walking, weights  Home (comment) walking, weights  Home (comment) walking, weights  --   Frequency  Add 1 additional day to program exercise sessions.  Add 1 additional day to program exercise sessions.  Add 2 additional days to program  exercise sessions.  Add 2 additional days to program exercise sessions.  --   Initial Home Exercises Provided  --  06/01/19  06/01/19  06/01/19  --      Exercise Comments: Exercise Comments    Row Name 07/13/19 0909 08/28/19 0816         Exercise Comments  NoTreadmill today and for a week. Aryanah is experiencing increased foot pain and thinks may be from walking on the treadmill. She has a history of foot trouble. She wants to try a week without using the treadmill and reevaluate after the week is over.  Sevanna graduated today from  rehab with 35 sessions completed.  Details of the patient's exercise prescription and what He needs to do in order to continue the prescription and progress were discussed with patient.  Patient was given a copy of prescription and goals.  Patient verbalized understanding.  Jassica plans to continue to exercise by going to the [place].         Exercise Goals and Review: Exercise Goals    Row Name 01/04/20 1230             Exercise Goals   Increase Physical Activity  Yes       Intervention  Provide advice, education, support and counseling about physical activity/exercise needs.;Develop an individualized exercise prescription for aerobic and resistive training based on initial evaluation findings, risk stratification, comorbidities and participant's personal goals.       Expected Outcomes  Short Term: Attend rehab on a regular basis to increase amount of physical activity.;Long Term: Add in home exercise to make exercise part of  routine and to increase amount of physical activity.;Long Term: Exercising regularly at least 3-5 days a week.       Increase Strength and Stamina  Yes       Intervention  Provide advice, education, support and counseling about physical activity/exercise needs.;Develop an individualized exercise prescription for aerobic and resistive training based on initial evaluation findings, risk stratification, comorbidities and participant's  personal goals.       Expected Outcomes  Short Term: Increase workloads from initial exercise prescription for resistance, speed, and METs.;Short Term: Perform resistance training exercises routinely during rehab and add in resistance training at home;Long Term: Improve cardiorespiratory fitness, muscular endurance and strength as measured by increased METs and functional capacity (6MWT)       Able to understand and use rate of perceived exertion (RPE) scale  Yes       Intervention  Provide education and explanation on how to use RPE scale       Expected Outcomes  Short Term: Able to use RPE daily in rehab to express subjective intensity level;Long Term:  Able to use RPE to guide intensity level when exercising independently       Able to understand and use Dyspnea scale  Yes       Intervention  Provide education and explanation on how to use Dyspnea scale       Expected Outcomes  Short Term: Able to use Dyspnea scale daily in rehab to express subjective sense of shortness of breath during exertion;Long Term: Able to use Dyspnea scale to guide intensity level when exercising independently       Knowledge and understanding of Target Heart Rate Range (THRR)  Yes       Intervention  Provide education and explanation of THRR including how the numbers were predicted and where they are located for reference       Expected Outcomes  Short Term: Able to state/look up THRR;Short Term: Able to use daily as guideline for intensity in rehab;Long Term: Able to use THRR to govern intensity when exercising independently       Able to check pulse independently  Yes       Intervention  Provide education and demonstration on how to check pulse in carotid and radial arteries.;Review the importance of being able to check your own pulse for safety during independent exercise       Expected Outcomes  Short Term: Able to explain why pulse checking is important during independent exercise;Long Term: Able to check pulse  independently and accurately       Understanding of Exercise Prescription  Yes       Intervention  Provide education, explanation, and written materials on patient's individual exercise prescription       Expected Outcomes  Short Term: Able to explain program exercise prescription;Long Term: Able to explain home exercise prescription to exercise independently          Exercise Goals Re-Evaluation : Exercise Goals Re-Evaluation    Row Name 07/10/19 1248 07/21/19 1444 08/03/19 0839 08/10/19 0816 08/26/19 1201     Exercise Goal Re-Evaluation   Exercise Goals Review  Increase Physical Activity;Increase Strength and Stamina;Able to understand and use rate of perceived exertion (RPE) scale;Knowledge and understanding of Target Heart Rate Range (THRR);Able to check pulse independently;Understanding of Exercise Prescription  Increase Physical Activity;Increase Strength and Stamina;Understanding of Exercise Prescription  Increase Physical Activity;Increase Strength and Stamina;Understanding of Exercise Prescription  Increase Physical Activity;Increase Strength and Stamina;Understanding of Exercise Prescription  Increase Physical  Activity;Increase Strength and Stamina;Understanding of Exercise Prescription   Comments  Makella has increased workload on TM.  She attends consistently and works in correct RPE range  Alla continues to do well in rehab.  The treadmill has been bothering her foot.  It sounds as though she may have the beginnings of plantar facisitis and we talked about some exercises to help her relieve this.  She is now up to level 4 on the NuStep and using 4 lbs weights.  We will talk about adding in intervals.  We will continue to monitor her progress.  Annett is doing well in rehab.  She continues to exericse daily with walking.  Her foot is feeling better and she no longer hurts when she walks.  She is hoping the treadmill will no longer bother her.  Amabel is getting ready to graduate from  [employer] and plans to exercise at the [facility] following graduation. Her foot is still feeling good with exercise and she feel like she has made a lot of progress with her execise and strength. Her only complaint is that she still feel weekness in her legs, especially when climbing stairs. She had talked to her doctor about this and she is monitoring it. If this continues her doctor may check blood flow to the legs.  Jaszmine will be graduating on Friday.  She is planning to join the [employer] and already has her orientation appointment set up.  She has done well.   Expected Outcomes  Short - stay consistent with exercise Long - improve overall MET level  Short: Talk about adding in intervals.  Long; Continue to to improve stamina.  Short: Continue to walk daily and talk about intervals.  Long: Continue to improve stamina.  Short: continue to monitor leg weakness and follow up with doctor about this issue. Long: Continue a heart heathly exercise program upon graduation.  Short: Graduate!  Long: Continue to exercise independently.      Discharge Exercise Prescription (Final Exercise Prescription Changes): Exercise Prescription Changes - 01/04/20 1200      Response to Exercise   Blood Pressure (Admit)  128/70    Blood Pressure (Exercise)  134/70    Blood Pressure (Exit)  112/64    Heart Rate (Admit)  69 bpm    Heart Rate (Exercise)  92 bpm    Heart Rate (Exit)  65 bpm    Oxygen Saturation (Admit)  97 %    Oxygen Saturation (Exercise)  95 %    Oxygen Saturation (Exit)  98 %    Rating of Perceived Exertion (Exercise)  11    Perceived Dyspnea (Exercise)  0    Symptoms  none    Comments  walk test results       Nutrition:  Target Goals: Understanding of nutrition guidelines, daily intake of sodium <1556m, cholesterol <2033m calories 30% from fat and 7% or less from saturated fats, daily to have 5 or more servings of fruits and vegetables.  Biometrics: Pre Biometrics - 01/04/20 1231       Pre Biometrics   Height  5' 6.75" (1.695 m)    Weight  190 lb 3.2 oz (86.3 kg)    BMI (Calculated)  30.03    Single Leg Stand  6.62 seconds      Post Biometrics - 08/17/19 0824       Post  Biometrics   Height  5' 6"  (1.676 m)    Weight  186 lb 11.2 oz (84.7 kg)  BMI (Calculated)  30.15    Single Leg Stand  30 seconds       Nutrition Therapy Plan and Nutrition Goals:   Nutrition Assessments: Nutrition Assessments - 01/04/20 1231      MEDFICTS Scores   Pre Score  18       Nutrition Goals Re-Evaluation: Nutrition Goals Re-Evaluation    [place] Name 08/24/19 1018             Goals   Nutrition Goal  ST: try more variety and add flexibility to diet LT: maintain wt, increase energy further, manage healthy       Comment  Continue with current changes.       Expected Outcome  Increase variety and increase sustainability          Nutrition Goals Discharge (Final Nutrition Goals Re-Evaluation): Nutrition Goals Re-Evaluation - 08/24/19 1018      Goals   Nutrition Goal  ST: try more variety and add flexibility to diet LT: maintain wt, increase energy further, manage healthy    Comment  Continue with current changes.    Expected Outcome  Increase variety and increase sustainability       Psychosocial: Target Goals: Acknowledge presence or absence of significant depression and/or stress, maximize coping skills, provide positive support system. Participant is able to verbalize types and ability to use techniques and skills needed for reducing stress and depression.   Initial Review & Psychosocial Screening: Initial Psych Review & Screening - 12/28/19 1411      Initial Review   Current issues with  Current Sleep Concerns    Comments  Inita states that she has never had very good sleep.      Family Dynamics   Good Support System?  Yes    Comments  She can talk to her husband and friedns for support      Barriers   Psychosocial barriers to participate in program  The  patient should benefit from training in stress management and relaxation.      Screening Interventions   Interventions  Encouraged to exercise;Provide feedback about the scores to participant;To provide support and resources with identified psychosocial needs    Expected Outcomes  Short Term goal: Utilizing psychosocial counselor, staff and physician to assist with identification of specific Stressors or current issues interfering with healing process. Setting desired goal for each stressor or current issue identified.;Long Term Goal: Stressors or current issues are controlled or eliminated.;Short Term goal: Identification and review with participant of any Quality of Life or Depression concerns found by scoring the questionnaire.;Long Term goal: The participant improves quality of Life and PHQ9 Scores as seen by post scores and/or verbalization of changes       Quality of Life Scores: Quality of Life - 08/27/19 0748      Quality of Life Scores   Health/Function Pre  26.6 %    Health/Function Post  26.2 %    Health/Function % Change  -1.5 %    Socioeconomic Pre  26.43 %    Socioeconomic Post  24.56 %    Socioeconomic % Change   -7.08 %    Psych/Spiritual Pre  29.14 %    Psych/Spiritual Post  24.85 %    Psych/Spiritual % Change  -14.72 %    Family Pre  23.4 %    Family Post  24.6 %    Family % Change  5.13 %    GLOBAL Pre  26.61 %    GLOBAL Post  25.32 %  GLOBAL % Change  -4.85 %      Scores of 19 and below usually indicate a poorer quality of life in these areas.  A difference of  2-3 points is a clinically meaningful difference.  A difference of 2-3 points in the total score of the Quality of Life Index has been associated with significant improvement in overall quality of life, self-image, physical symptoms, and general health in studies assessing change in quality of life.  PHQ-9: Recent Review Flowsheet Data    Depression screen [facility] 2/9 01/04/2020 05/18/2019 12/31/2018 10/03/2017  09/16/2017   Decreased Interest 0 1 0 0 0   Down, Depressed, Hopeless 0 0 0 0 0   PHQ - 2 Score 0 1 0 0 0   Altered sleeping 3 0 - 3 -   Tired, decreased energy 1 1 - 1 -   Change in appetite 0 0 - 0 -   Feeling bad or failure about yourself  0 0 - 0 -   Trouble concentrating 0 0 - 0 -   Moving slowly or fidgety/restless 0 0 - 0 -   Suicidal thoughts 0 0 - 0 -   PHQ-9 Score 4 2 - 4 -   Difficult doing work/chores Not difficult at all Not difficult at all - Not difficult at all -     Interpretation of Total Score  Total Score Depression Severity:  1-4 = Minimal depression, 5-9 = Mild depression, 10-14 = Moderate depression, 15-19 = Moderately severe depression, 20-27 = Severe depression   Psychosocial Evaluation and Intervention: Psychosocial Evaluation - 12/28/19 1414      Psychosocial Evaluation & Interventions   Interventions  Encouraged to exercise with the program and follow exercise prescription    Comments  She has a positive outlook on the program and whats she needs to do to lose weight and be less short of breath/    Expected Outcomes  Short: Attend [facility] stress management education to decrease stress. Long: Maintain exercise [facility] to keep stress at a minimum.    Continue Psychosocial Services   Follow up required by staff       Psychosocial Re-Evaluation: Psychosocial Re-Evaluation    Row Name 08/03/19 8469 08/10/19 0817           Psychosocial Re-Evaluation   Current issues with  None Identified  None Identified      Comments  Bailley is doing well mentally.  She does not have any major stressors or concerns.  Her sleep has gotten a lot better.  She has stopped napping during the day and is able to sleep better at night now.  She has also established a bedtime routine and sticks to it.  Christianna reports no new stressors and feel like she is doing well mentally. She is still sleeping well and had continued with her bedtime routine. She stated that she is  not eating late a night right before bed, which she feels helps her sleep better. Katarzyna is getting ready to graduate and plans to continue to exercise in the [facility] and stick with the heathy lifestyle changes she had made.      Expected Outcomes  Short: Continue to sleep better.  Long: Continue to stay positive.  Short: Graduate from [employer] and continue to exercise at the [facility]. Long: Maintain heart healthty habbits and sleep routines upon graduation from [facility] program.      Interventions  Encouraged to attend Cardiac Rehabilitation for the exercise  Encouraged  to attend Cardiac Rehabilitation for the exercise      Continue Psychosocial Services   Follow up required by staff  Follow up required by staff         Psychosocial Discharge (Final Psychosocial Re-Evaluation): Psychosocial Re-Evaluation - 08/10/19 0817      Psychosocial Re-Evaluation   Current issues with  None Identified    Comments  Ercie reports no new stressors and feel like she is doing well mentally. She is still sleeping well and had continued with her bedtime routine. She stated that she is not eating late a night right before bed, which she feels helps her sleep better. Kandiss is getting ready to graduate and plans to continue to exercise in the [facility] and stick with the heathy lifestyle changes she had made.    Expected Outcomes  Short: Graduate from [facility] and continue to exercise at the [facility]. Long: Maintain heart healthty habbits and sleep routines upon graduation from [facility] program.    Interventions  Encouraged to attend Cardiac Rehabilitation for the exercise    Continue Psychosocial Services   Follow up required by staff       Education: Education Goals: Education classes will be provided on a weekly basis, covering required topics. Participant will state understanding/return demonstration of topics presented.  Learning Barriers/Preferences: Learning Barriers/Preferences - 12/28/19  1412      Learning Barriers/Preferences   Learning Barriers  None    Learning Preferences  None       Education Topics:  Initial Evaluation Education: - Verbal, written and demonstration of respiratory meds, oximetry and breathing techniques. Instruction on use of nebulizers and MDIs and importance of monitoring MDI activations.   Pulmonary Rehab from 01/04/2020 in [facility] Cardiac and Pulmonary Rehab  Date  01/04/20  Educator  [facility]  Instruction Review Code  1- Verbalizes Understanding      General Nutrition Guidelines/Fats and Fiber: -Group instruction provided by verbal, written material, models and posters to present the general guidelines for heart healthy nutrition. Gives an explanation and review of dietary fats and fiber.   Controlling Sodium/Reading Food Labels: -Group verbal and written material supporting the discussion of sodium use in heart healthy nutrition. Review and explanation with models, verbal and written materials for utilization of the food label.   Exercise Physiology & General Exercise Guidelines: - Group verbal and written instruction with models to review the exercise physiology of the cardiovascular system and associated critical values. Provides general exercise guidelines with specific guidelines to those with heart or lung disease.    Aerobic Exercise & Resistance Training: - Gives group verbal and written instruction on the various components of exercise. Focuses on aerobic and resistive training programs and the benefits of this training and how to safely progress through these programs.   Cardiac Rehab from 08/26/2019 in [facility] Cardiac and Pulmonary Rehab  Date  08/26/19  Educator  As  Instruction Review Code  1- Verbalizes Understanding      Flexibility, Balance, Mind/Body Relaxation: Provides group verbal/written instruction on the benefits of flexibility and balance training, including mind/body exercise modes such as yoga, pilates and tai chi.   Demonstration and skill practice provided.   Stress and Anxiety: - Provides group verbal and written instruction about the health risks of elevated stress and causes of high stress.  Discuss the correlation between heart/lung disease and anxiety and treatment options. Review healthy ways to manage with stress and anxiety.   Depression: - Provides group verbal and written instruction on  the correlation between heart/lung disease and depressed mood, treatment options, and the stigmas associated with seeking treatment.   Exercise & Equipment Safety: - Individual verbal instruction and demonstration of equipment use and safety with use of the equipment.   Pulmonary Rehab from 12/28/2019 in [facility] Cardiac and Pulmonary Rehab  Date  12/28/19  Educator  [facility]  Instruction Review Code  1- Verbalizes Understanding      Infection Prevention: - Provides verbal and written material to individual with discussion of infection control including proper hand washing and proper equipment cleaning during exercise session.   Pulmonary Rehab from 12/28/2019 in [facility] Cardiac and Pulmonary Rehab  Date  12/28/19  Educator  [facility]  Instruction Review Code  1- Verbalizes Understanding      Falls Prevention: - Provides verbal and written material to individual with discussion of falls prevention and safety.   Pulmonary Rehab from 12/28/2019 in [facility] Cardiac and Pulmonary Rehab  Date  12/28/19  Educator  [facility]  Instruction Review Code  1- Verbalizes Understanding      Diabetes: - Individual verbal and written instruction to review signs/symptoms of diabetes, desired ranges of glucose level fasting, after meals and with exercise. Advice that pre and post exercise glucose checks will be done for 3 sessions at entry of program.   Chronic Lung Diseases: - Group verbal and written instruction to review updates, respiratory medications, advancements in procedures and treatments. Discuss use of supplemental oxygen including  available portable oxygen systems, continuous and intermittent flow rates, concentrators, personal use and safety guidelines. Review proper use of inhaler and spacers. Provide informative websites for self-education.    Energy Conservation: - Provide group verbal and written instruction for methods to conserve energy, plan and organize activities. Instruct on pacing techniques, use of adaptive equipment and posture/positioning to relieve shortness of breath.   Triggers and Exacerbations: - Group verbal and written instruction to review types of environmental triggers and ways to prevent exacerbations. Discuss weather changes, air quality and the benefits of nasal washing. Review warning signs and symptoms to help prevent infections. Discuss techniques for effective airway clearance, coughing, and vibrations.   AED/CPR: - Group verbal and written instruction with the use of models to demonstrate the basic use of the AED with the basic ABC's of resuscitation.   Anatomy and Physiology of the Lungs: - Group verbal and written instruction with the use of models to provide basic lung anatomy and physiology related to function, structure and complications of lung disease.   Anatomy & Physiology of the Heart: - Group verbal and written instruction and models provide basic cardiac anatomy and physiology, with the coronary electrical and arterial systems. Review of Valvular disease and Heart Failure   Cardiac Medications: - Group verbal and written instruction to review commonly prescribed medications for heart disease. Reviews the medication, class of the drug, and side effects.   Know Your Numbers and Risk Factors: -Group verbal and written instruction about important numbers in your health.  Discussion of what are risk factors and how they play a role in the disease process.  Review of Cholesterol, Blood Pressure, Diabetes, and BMI and the role they play in your overall health.   Cardiac Rehab  from 08/12/2019 in [facility] Cardiac and Pulmonary Rehab  Date  08/12/19  Educator  [facility]  Instruction Review Code  1- Verbalizes Understanding      Sleep Hygiene: -Provides group verbal and written instruction about how sleep can affect your health.  Define sleep hygiene, discuss sleep  cycles and impact of sleep habits. Review good sleep hygiene tips.    Other: -Provides group and verbal instruction on various topics (see comments)    Knowledge Questionnaire Score: Knowledge Questionnaire Score - 01/04/20 1232      Knowledge Questionnaire Score   Pre Score  15/18 Education Focus: COPD,        Core Components/Risk Factors/Patient Goals at Admission: Personal Goals and Risk Factors at Admission - 01/04/20 1250      Core Components/Risk Factors/Patient Goals on Admission    Weight Management  Yes;Weight Loss    Intervention  Weight Management: Develop a combined nutrition and exercise program designed to reach desired caloric intake, while maintaining appropriate intake of nutrient and fiber, sodium and fats, and appropriate energy expenditure required for the weight goal.;Weight Management: Provide education and appropriate resources to help participant work on and attain dietary goals.    Admit Weight  190 lb 3.2 oz (86.3 kg)    Goal Weight: Short Term  185 lb (83.9 kg)    Goal Weight: Long Term  180 lb (81.6 kg)    Expected Outcomes  Short Term: Continue to assess and modify interventions until short term weight is achieved;Long Term: Adherence to nutrition and physical activity/exercise program aimed toward attainment of established weight goal;Weight Loss: Understanding of general recommendations for a balanced deficit meal plan, which promotes 1-2 lb weight loss per week and includes a negative energy balance of [phone] kcal/d;Understanding recommendations for meals to include 15-35% energy as protein, 25-35% energy from fat, 35-60% energy from carbohydrates, less than 290m of dietary  cholesterol, 20-35 gm of total fiber daily;Understanding of distribution of calorie intake throughout the day with the consumption of 4-5 meals/snacks    Improve shortness of breath with ADL's  Yes    Intervention  Provide education, individualized exercise plan and daily activity instruction to help decrease symptoms of SOB with activities of daily living.    Expected Outcomes  Long Term: Be able to perform more ADLs without symptoms or delay the onset of symptoms;Short Term: Improve cardiorespiratory fitness to achieve a reduction of symptoms when performing ADLs    Lipids  Yes    Intervention  Provide education and support for participant on nutrition & aerobic/resistive exercise along with prescribed medications to achieve LDL <72m HDL >4055m   Expected Outcomes  Short Term: Participant states understanding of desired cholesterol values and is compliant with medications prescribed. Participant is following exercise prescription and nutrition guidelines.;Long Term: Cholesterol controlled with medications as prescribed, with individualized exercise RX and with personalized nutrition plan. Value goals: LDL < 40m8mDL > 40 mg.       Core Components/Risk Factors/Patient Goals Review:  Goals and Risk Factor Review    Row Name 08/03/19 0845 08/10/19 [id number]       Core Components/Risk Factors/Patient Goals Review   Personal Goals Review  Weight Management/Obesity;Hypertension;Lipids  Weight Management/Obesity;Hypertension;Lipids      Review  MarlHaddydoing well in rehab. Her weight was up just a little after being out for a week.  She is walking more at home to help bring it down.  Her blood pressures have been good and she continues to check them.  She did a call from her doctor about her lipids being higher so they upped her medications.  MarlIonaorts taking all medications as prescribed by doctor. She is managing her lipids and blood pressure with meds, exercise, and diet. Her weight had  been  maintained with small ups and downs.      Expected Outcomes  Short: Continue to work on weight loss.  Long: Continue to monitor risk factors.  Short: Continue to work on weight loss.  Long: Continue to monitor risk factors.         Core Components/Risk Factors/Patient Goals at Discharge (Final Review):  Goals and Risk Factor Review - 08/10/19 0828      Core Components/Risk Factors/Patient Goals Review   Personal Goals Review  Weight Management/Obesity;Hypertension;Lipids    Review  Juliany reports taking all medications as prescribed by doctor. She is managing her lipids and blood pressure with meds, exercise, and diet. Her weight had been maintained with small ups and downs.    Expected Outcomes  Short: Continue to work on weight loss.  Long: Continue to monitor risk factors.       ITP Comments: ITP Comments    Row Name 07/13/19 0909 07/29/19 1048 08/26/19 0639 08/28/19 0816 12/28/19 1421   ITP Comments  NoTreadmill today and for a week. Alissah is experiencing increased foot pain and thinks may be from walking on the treadmill. She has a history of foot trouble. She wants to try a week without using the treadmill and reevaluate after the week is over.  30 day review completed. ITP sent to Dr. Emily Filbert, Medical Director of Cardiac and Pulmonary Rehab. Continue with ITP unless changes are made by physician.  Department closed starting 10/2 until further notice by infection prevention and Health at Work teams for COVID-19.  30 day review completed. Continue with ITP sent to Dr. Emily Filbert, Medical Director of Cardiac and Pulmonary Rehab for review , changes as needed and signature.  Brittyn graduated today from  rehab with 35 sessions completed.  Details of the patient's exercise prescription and what He needs to do in order to continue the prescription and progress were discussed with patient.  Patient was given a copy of prescription and goals.  Patient verbalized understanding.  Noemi  plans to continue to exercise by going to the [place].  Virtual Orientation performed. Patient informed when to come in for RD and EP orientation. Diagnosis can be found in [facility] 12/22/2019   Row Name 01/04/20 1224           ITP Comments  Completed 6MWT and gym orientation.  Initial ITP created and sent for review to Dr. Emily Filbert, Medical Director.          Comments: Initial ITP

## 2020-01-04 NOTE — Patient Instructions (Signed)
Patient Instructions  Patient Details  Name: Jessica [name] MRN: [id number] Date of Birth: 13-Oct-1951 Referring Provider:  Lorretta Harp, MD  Below are your personal goals for exercise, nutrition, and risk factors. Our goal is to help you stay on track towards obtaining and maintaining these goals. We will be discussing your progress on these goals with you throughout the program.  Initial Exercise Prescription: Initial Exercise Prescription - 01/04/20 1200      Date of Initial Exercise RX and Referring Provider   Date  01/04/20    Referring Provider  Quay Burow MD      Treadmill   MPH  2.5    Grade  0.5    Minutes  15    METs  3.09      Recumbant Bike   Level  3    RPM  50    Watts  32    Minutes  15    METs  3      Arm Ergometer   Level  2    Watts  42    RPM  25    Minutes  15    METs  3      Elliptical   Level  1    Speed  3    Minutes  15    METs  3      Prescription Details   Frequency (times per week)  3    Duration  Progress to 30 minutes of continuous aerobic without signs/symptoms of physical distress      Intensity   THRR 40-80% of Max Heartrate  102-135    Ratings of Perceived Exertion  11-13    Perceived Dyspnea  0-4      Progression   Progression  Continue to progress workloads to maintain intensity without signs/symptoms of physical distress.      Resistance Training   Training Prescription  Yes    Weight  3 lb    Reps  10-15       Exercise Goals: Frequency: Be able to perform aerobic exercise two to three times per week in program working toward 2-5 days per week of home exercise.  Intensity: Work with a perceived exertion of 11 (fairly light) - 15 (hard) while following your exercise prescription.  We will make changes to your prescription with you as you progress through the program.   Duration: Be able to do 30 to 45 minutes of continuous aerobic exercise in addition to a 5 minute warm-up and a 5 minute cool-down  routine.   Nutrition Goals: Your personal nutrition goals will be established when you do your nutrition analysis with the dietician.  The following are general nutrition guidelines to follow: Cholesterol < 200mg /day Sodium < 1500mg /day Fiber: Women over 50 yrs - 21 grams per day  Personal Goals: Personal Goals and Risk Factors at Admission - 01/04/20 1250      Core Components/Risk Factors/Patient Goals on Admission    Weight Management  Yes;Weight Loss    Intervention  Weight Management: Develop a combined nutrition and exercise program designed to reach desired caloric intake, while maintaining appropriate intake of nutrient and fiber, sodium and fats, and appropriate energy expenditure required for the weight goal.;Weight Management: Provide education and appropriate resources to help participant work on and attain dietary goals.    Admit Weight  190 lb 3.2 oz (86.3 kg)    Goal Weight: Short Term  185 lb (83.9 kg)    Goal Weight: Long  Term  180 lb (81.6 kg)    Expected Outcomes  Short Term: Continue to assess and modify interventions until short term weight is achieved;Long Term: Adherence to nutrition and physical activity/exercise program aimed toward attainment of established weight goal;Weight Loss: Understanding of general recommendations for a balanced deficit meal plan, which promotes 1-2 lb weight loss per week and includes a negative energy balance of [phone] kcal/d;Understanding recommendations for meals to include 15-35% energy as protein, 25-35% energy from fat, 35-60% energy from carbohydrates, less than 200mg  of dietary cholesterol, 20-35 gm of total fiber daily;Understanding of distribution of calorie intake throughout the day with the consumption of 4-5 meals/snacks    Improve shortness of breath with ADL's  Yes    Intervention  Provide education, individualized exercise plan and daily activity instruction to help decrease symptoms of SOB with activities of daily living.     Expected Outcomes  Long Term: Be able to perform more ADLs without symptoms or delay the onset of symptoms;Short Term: Improve cardiorespiratory fitness to achieve a reduction of symptoms when performing ADLs    Lipids  Yes    Intervention  Provide education and support for participant on nutrition & aerobic/resistive exercise along with prescribed medications to achieve LDL 70mg , HDL >40mg .    Expected Outcomes  Short Term: Participant states understanding of desired cholesterol values and is compliant with medications prescribed. Participant is following exercise prescription and nutrition guidelines.;Long Term: Cholesterol controlled with medications as prescribed, with individualized exercise RX and with personalized nutrition plan. Value goals: LDL < 70mg , HDL > 40 mg.       Tobacco Use Initial Evaluation: Social History   Tobacco Use  Smoking Status Never Smoker  Smokeless Tobacco Never Used    Exercise Goals and Review: Exercise Goals    Row Name 01/04/20 1230             Exercise Goals   Increase Physical Activity  Yes       Intervention  Provide advice, education, support and counseling about physical activity/exercise needs.;Develop an individualized exercise prescription for aerobic and resistive training based on initial evaluation findings, risk stratification, comorbidities and participant's personal goals.       Expected Outcomes  Short Term: Attend rehab on a regular basis to increase amount of physical activity.;Long Term: Add in home exercise to make exercise part of routine and to increase amount of physical activity.;Long Term: Exercising regularly at least 3-5 days a week.       Increase Strength and Stamina  Yes       Intervention  Provide advice, education, support and counseling about physical activity/exercise needs.;Develop an individualized exercise prescription for aerobic and resistive training based on initial evaluation findings, risk stratification,  comorbidities and participant's personal goals.       Expected Outcomes  Short Term: Increase workloads from initial exercise prescription for resistance, speed, and METs.;Short Term: Perform resistance training exercises routinely during rehab and add in resistance training at home;Long Term: Improve cardiorespiratory fitness, muscular endurance and strength as measured by increased METs and functional capacity (6MWT)       Able to understand and use rate of perceived exertion (RPE) scale  Yes       Intervention  Provide education and explanation on how to use RPE scale       Expected Outcomes  Short Term: Able to use RPE daily in rehab to express subjective intensity level;Long Term:  Able to use RPE to guide intensity level when exercising  independently       Able to understand and use Dyspnea scale  Yes       Intervention  Provide education and explanation on how to use Dyspnea scale       Expected Outcomes  Short Term: Able to use Dyspnea scale daily in rehab to express subjective sense of shortness of breath during exertion;Long Term: Able to use Dyspnea scale to guide intensity level when exercising independently       Knowledge and understanding of Target Heart Rate Range (THRR)  Yes       Intervention  Provide education and explanation of THRR including how the numbers were predicted and where they are located for reference       Expected Outcomes  Short Term: Able to state/look up THRR;Short Term: Able to use daily as guideline for intensity in rehab;Long Term: Able to use THRR to govern intensity when exercising independently       Able to check pulse independently  Yes       Intervention  Provide education and demonstration on how to check pulse in carotid and radial arteries.;Review the importance of being able to check your own pulse for safety during independent exercise       Expected Outcomes  Short Term: Able to explain why pulse checking is important during independent exercise;Long  Term: Able to check pulse independently and accurately       Understanding of Exercise Prescription  Yes       Intervention  Provide education, explanation, and written materials on patient's individual exercise prescription       Expected Outcomes  Short Term: Able to explain program exercise prescription;Long Term: Able to explain home exercise prescription to exercise independently          Copy of goals given to participant.

## 2020-01-08 ENCOUNTER — Ambulatory Visit (HOSPITAL_COMMUNITY): Payer: Medicare PPO | Attending: Internal Medicine

## 2020-01-08 ENCOUNTER — Other Ambulatory Visit: Payer: Self-pay

## 2020-01-08 DIAGNOSIS — R0602 Shortness of breath: Secondary | ICD-10-CM | POA: Diagnosis not present

## 2020-01-11 ENCOUNTER — Other Ambulatory Visit: Payer: Self-pay

## 2020-01-11 ENCOUNTER — Encounter: Payer: Medicare PPO | Admitting: *Deleted

## 2020-01-11 DIAGNOSIS — Z853 Personal history of malignant neoplasm of breast: Secondary | ICD-10-CM | POA: Diagnosis not present

## 2020-01-11 DIAGNOSIS — I2510 Atherosclerotic heart disease of native coronary artery without angina pectoris: Secondary | ICD-10-CM | POA: Diagnosis not present

## 2020-01-11 DIAGNOSIS — Z79899 Other long term (current) drug therapy: Secondary | ICD-10-CM | POA: Diagnosis not present

## 2020-01-11 DIAGNOSIS — R0600 Dyspnea, unspecified: Secondary | ICD-10-CM | POA: Diagnosis not present

## 2020-01-11 DIAGNOSIS — Z923 Personal history of irradiation: Secondary | ICD-10-CM | POA: Diagnosis not present

## 2020-01-11 DIAGNOSIS — K219 Gastro-esophageal reflux disease without esophagitis: Secondary | ICD-10-CM | POA: Diagnosis not present

## 2020-01-11 DIAGNOSIS — E785 Hyperlipidemia, unspecified: Secondary | ICD-10-CM | POA: Diagnosis not present

## 2020-01-11 DIAGNOSIS — Z7982 Long term (current) use of aspirin: Secondary | ICD-10-CM | POA: Diagnosis not present

## 2020-01-11 DIAGNOSIS — R0609 Other forms of dyspnea: Secondary | ICD-10-CM

## 2020-01-11 NOTE — Progress Notes (Signed)
Daily Session Note  Patient Details  Name: Jessica [name] MRN: [id number] Date of Birth: [birth year] Referring Provider:     Pulmonary Rehab from 01/04/2020 in [facility]  Referring Provider  Quay Burow MD      Encounter Date: 01/11/2020  Check In:      Social History   Tobacco Use  Smoking Status Never Smoker  Smokeless Tobacco Never Used    Goals Met:  Exercise tolerated well Personal goals reviewed No report of cardiac concerns or symptoms  Goals Unmet:  Not Applicable  Comments: First full day of exercise!  Patient was oriented to gym and equipment including functions, settings, policies, and procedures.  Patient's individual exercise prescription and treatment plan were reviewed.  All starting workloads were established based on the results of the 6 minute walk test done at initial orientation visit.  The plan for exercise progression was also introduced and progression will be customized based on patient's performance and goals.    Dr. Emily Filbert is Medical Director for [facility].

## 2020-01-13 ENCOUNTER — Other Ambulatory Visit: Payer: Self-pay

## 2020-01-13 ENCOUNTER — Encounter: Payer: Medicare PPO | Admitting: *Deleted

## 2020-01-13 ENCOUNTER — Encounter: Payer: Self-pay | Admitting: *Deleted

## 2020-01-13 DIAGNOSIS — I2510 Atherosclerotic heart disease of native coronary artery without angina pectoris: Secondary | ICD-10-CM | POA: Diagnosis not present

## 2020-01-13 DIAGNOSIS — Z853 Personal history of malignant neoplasm of breast: Secondary | ICD-10-CM | POA: Diagnosis not present

## 2020-01-13 DIAGNOSIS — E785 Hyperlipidemia, unspecified: Secondary | ICD-10-CM | POA: Diagnosis not present

## 2020-01-13 DIAGNOSIS — R0600 Dyspnea, unspecified: Secondary | ICD-10-CM | POA: Diagnosis not present

## 2020-01-13 DIAGNOSIS — R0609 Other forms of dyspnea: Secondary | ICD-10-CM

## 2020-01-13 DIAGNOSIS — Z7982 Long term (current) use of aspirin: Secondary | ICD-10-CM | POA: Diagnosis not present

## 2020-01-13 DIAGNOSIS — Z923 Personal history of irradiation: Secondary | ICD-10-CM | POA: Diagnosis not present

## 2020-01-13 DIAGNOSIS — K219 Gastro-esophageal reflux disease without esophagitis: Secondary | ICD-10-CM | POA: Diagnosis not present

## 2020-01-13 DIAGNOSIS — Z951 Presence of aortocoronary bypass graft: Secondary | ICD-10-CM

## 2020-01-13 DIAGNOSIS — Z79899 Other long term (current) drug therapy: Secondary | ICD-10-CM | POA: Diagnosis not present

## 2020-01-13 NOTE — Progress Notes (Signed)
Pulmonary Individual Treatment Plan  Patient Details  Name: Yazlin Ekblad MRN: [id number] Date of Birth: Jun 17, 1951 Referring Provider:     Pulmonary Rehab from 01/04/2020 in [facility] Cardiac and Pulmonary Rehab  Referring Provider  Quay Burow MD      Initial Encounter Date:    Pulmonary Rehab from 01/04/2020 in [facility] Cardiac and Pulmonary Rehab  Date  01/04/20      Visit Diagnosis: Dyspnea on exertion  Patient's Home Medications on Admission:  Current Outpatient Medications:  .  Ascorbic Acid (VITAMIN C) 1000 MG tablet, Take 2,000 mg by mouth daily., Disp: , Rfl:  .  aspirin EC 325 MG EC tablet, Take 1 tablet (325 mg total) by mouth daily. (Patient taking differently: Take 325 mg by mouth at bedtime. ), Disp: , Rfl: 0 .  Cholecalciferol (VITAMIN D-3) 125 MCG (5000 UT) TABS, Take 5,000 Units by mouth daily., Disp: , Rfl:  .  Evolocumab (REPATHA SURECLICK) 366 MG/ML SOAJ, Inject 140 mg into the skin every 14 (fourteen) days., Disp: 2 pen, Rfl: 11 .  hydrocortisone (ANUSOL-HC) 2.5 % rectal cream, Place 1 application rectally 2 (two) times daily., Disp: 30 g, Rfl: 6 .  loratadine (CLARITIN) 10 MG tablet, Take 10 mg by mouth every evening. , Disp: , Rfl:  .  metoprolol tartrate (LOPRESSOR) 25 MG tablet, Take 0.5 tablets (12.5 mg total) by mouth daily., Disp: 30 tablet, Rfl: 11 .  Multiple Vitamin (MULTIVITAMIN WITH MINERALS) TABS tablet, Take 1 tablet by mouth daily., Disp: , Rfl:   Past Medical History: Past Medical History:  Diagnosis Date  . Allergy   . Breast cancer ([place]) 2000  . Cancer [facility])    breast  . Colon polyp   . Coronary artery disease   . GERD (gastroesophageal reflux disease)   . History of blood transfusion   . Hyperlipidemia   . Migraine    history of migrarines, none as an adult  . Personal history of radiation therapy 2000    Tobacco Use: Social History   Tobacco Use  Smoking Status Never Smoker  Smokeless Tobacco Never Used    Labs: Recent  Review Flowsheet Data    Labs for ITP Cardiac and Pulmonary Rehab Latest Ref Rng & Units 01/05/2019 01/05/2019 03/12/2019 06/16/2019 09/21/2019   Cholestrol 0 - 200 mg/dL - - 113 200 115   LDLCALC 0 - 99 mg/dL - - 63 145(H) 62   HDL >39.00 mg/dL - - 33(L) 33.20(L) 37.90(L)   Trlycerides 0.0 - 149.0 mg/dL - - 83 109.0 76.0   Hemoglobin A1c 4.6 - 6.5 % - - - 6.4 5.9   PHART 7.350 - 7.450 7.315(L) 7.265(L) - - -   PCO2ART 32.0 - 48.0 mmHg 47.1 49.9(H) - - -   HCO3 20.0 - 28.0 mmol/L 23.9 22.6 - - -   TCO2 22 - 32 mmol/L 25 24 - - -   ACIDBASEDEF 0.0 - 2.0 mmol/L 2.0 4.0(H) - - -   O2SAT % 97.0 98.0 - - -       Pulmonary Assessment Scores: Pulmonary Assessment Scores    Row Name 01/04/20 1251         ADL UCSD   ADL Phase  Entry     SOB Score total  9     Rest  0     Walk  1     Stairs  4     Bath  0     Dress  0  Shop  0       CAT Score   CAT Score  7       mMRC Score   mMRC Score  1        UCSD: Self-administered rating of dyspnea associated with activities of daily living (ADLs) 6-point scale (0 = "not at all" to 5 = "maximal or unable to do because of breathlessness")  Scoring Scores range from 0 to 120.  Minimally important difference is 5 units  CAT: CAT can identify the health impairment of COPD patients and is better correlated with disease progression.  CAT has a scoring range of zero to 40. The CAT score is classified into four groups of low (less than 10), medium (10 - 20), high (21-30) and very high (31-40) based on the impact level of disease on health status. A CAT score over 10 suggests significant symptoms.  A worsening CAT score could be explained by an exacerbation, poor medication adherence, poor inhaler technique, or progression of COPD or comorbid conditions.  CAT MCID is 2 points  mMRC: mMRC (Modified Medical Research Council) Dyspnea Scale is used to assess the degree of baseline functional disability in patients of respiratory disease due to  dyspnea. No minimal important difference is established. A decrease in score of 1 point or greater is considered a positive change.   Pulmonary Function Assessment: Pulmonary Function Assessment - 12/28/19 1410      Breath   Shortness of Breath  Yes;Limiting activity       Exercise Target Goals: Exercise Program Goal: Individual exercise prescription set using results from initial 6 min walk test and THRR while considering  patient's activity barriers and safety.   Exercise Prescription Goal: Initial exercise prescription builds to 30-45 minutes a day of aerobic activity, 2-3 days per week.  Home exercise guidelines will be given to patient during program as part of exercise prescription that the participant will acknowledge.  Activity Barriers & Risk Stratification: Activity Barriers & Cardiac Risk Stratification - 01/04/20 1227      Activity Barriers & Cardiac Risk Stratification   Activity Barriers  Muscular Weakness;Deconditioning;Shortness of Breath       6 Minute Walk: 6 Minute Walk    Row Name 01/04/20 1225         6 Minute Walk   Phase  Initial     Distance  1635 feet     Walk Time  6 minutes     # of Rest Breaks  0     MPH  2.95     METS  3.2     RPE  11     Perceived Dyspnea   0     VO2 Peak  11.2     Symptoms  No     Resting HR  69 bpm     Resting BP  128/70     Resting Oxygen Saturation   97 %     Exercise Oxygen Saturation  during 6 min walk  97 %     Max Ex. HR  92 bpm     Max Ex. BP  134/70     2 Minute Post BP  124/70       Interval HR   1 Minute HR  79     2 Minute HR  86     3 Minute HR  82     4 Minute HR  89     5 Minute HR  91  6 Minute HR  92     2 Minute Post HR  68     Interval Heart Rate?  Yes       Interval Oxygen   Interval Oxygen?  Yes     Baseline Oxygen Saturation %  97 %     1 Minute Oxygen Saturation %  97 %     1 Minute Liters of Oxygen  0 L Room Air     2 Minute Oxygen Saturation %  95 %     2 Minute Liters of  Oxygen  0 L     3 Minute Oxygen Saturation %  97 %     3 Minute Liters of Oxygen  0 L     4 Minute Oxygen Saturation %  97 %     4 Minute Liters of Oxygen  0 L     5 Minute Oxygen Saturation %  97 %     5 Minute Liters of Oxygen  0 L     6 Minute Oxygen Saturation %  97 %     6 Minute Liters of Oxygen  0 L     2 Minute Post Oxygen Saturation %  98 %     2 Minute Post Liters of Oxygen  0 L       Oxygen Initial Assessment: Oxygen Initial Assessment - 12/28/19 1409      Home Oxygen   Home Oxygen Device  None    Sleep Oxygen Prescription  None    Home Exercise Oxygen Prescription  None    Home at Rest Exercise Oxygen Prescription  None      Initial 6 min Walk   Oxygen Used  None      Program Oxygen Prescription   Program Oxygen Prescription  None      Intervention   Short Term Goals  To learn and understand importance of monitoring SPO2 with pulse oximeter and demonstrate accurate use of the pulse oximeter.;To learn and understand importance of maintaining oxygen saturations>88%;To learn and demonstrate proper pursed lip breathing techniques or other breathing techniques.    Long  Term Goals  Verbalizes importance of monitoring SPO2 with pulse oximeter and return demonstration;Maintenance of O2 saturations>88%;Compliance with respiratory medication;Exhibits proper breathing techniques, such as pursed lip breathing or other method taught during program session       Oxygen Re-Evaluation: Oxygen Re-Evaluation    Row Name 01/11/20 0857             Program Oxygen Prescription   Program Oxygen Prescription  None         Home Oxygen   Home Oxygen Device  None       Sleep Oxygen Prescription  None       Home Exercise Oxygen Prescription  None       Home at Rest Exercise Oxygen Prescription  None       Compliance with Home Oxygen Use  Yes         Goals/Expected Outcomes   Short Term Goals  To learn and demonstrate proper pursed lip breathing techniques or other breathing  techniques.       Long  Term Goals  Exhibits proper breathing techniques, such as pursed lip breathing or other method taught during program session       Comments  Reviewed PLB technique with pt.  Talked about how it works and it's importance in maintaining their exercise saturations.       Goals/Expected Outcomes  Short:  Become more profiecient at using PLB.   Long: Become independent at using PLB.          Oxygen Discharge (Final Oxygen Re-Evaluation): Oxygen Re-Evaluation - 01/11/20 0857      Program Oxygen Prescription   Program Oxygen Prescription  None      Home Oxygen   Home Oxygen Device  None    Sleep Oxygen Prescription  None    Home Exercise Oxygen Prescription  None    Home at Rest Exercise Oxygen Prescription  None    Compliance with Home Oxygen Use  Yes      Goals/Expected Outcomes   Short Term Goals  To learn and demonstrate proper pursed lip breathing techniques or other breathing techniques.    Long  Term Goals  Exhibits proper breathing techniques, such as pursed lip breathing or other method taught during program session    Comments  Reviewed PLB technique with pt.  Talked about how it works and it's importance in maintaining their exercise saturations.    Goals/Expected Outcomes  Short: Become more profiecient at using PLB.   Long: Become independent at using PLB.       Initial Exercise Prescription: Initial Exercise Prescription - 01/04/20 1200      Date of Initial Exercise RX and Referring Provider   Date  01/04/20    Referring Provider  Quay Burow MD      Treadmill   MPH  2.5    Grade  0.5    Minutes  15    METs  3.09      Recumbant Bike   Level  3    RPM  50    Watts  32    Minutes  15    METs  3      Arm Ergometer   Level  2    Watts  42    RPM  25    Minutes  15    METs  3      Elliptical   Level  1    Speed  3    Minutes  15    METs  3      Prescription Details   Frequency (times per week)  3    Duration  Progress to 30  minutes of continuous aerobic without signs/symptoms of physical distress      Intensity   THRR 40-80% of Max Heartrate  102-135    Ratings of Perceived Exertion  11-13    Perceived Dyspnea  0-4      Progression   Progression  Continue to progress workloads to maintain intensity without signs/symptoms of physical distress.      Resistance Training   Training Prescription  Yes    Weight  3 lb    Reps  10-15       Perform Capillary Blood Glucose checks as needed.  Exercise Prescription Changes: Exercise Prescription Changes    Row Name 01/04/20 1200             Response to Exercise   Blood Pressure (Admit)  128/70       Blood Pressure (Exercise)  134/70       Blood Pressure (Exit)  112/64       Heart Rate (Admit)  69 bpm       Heart Rate (Exercise)  92 bpm       Heart Rate (Exit)  65 bpm       Oxygen Saturation (Admit)  97 %  Oxygen Saturation (Exercise)  95 %       Oxygen Saturation (Exit)  98 %       Rating of Perceived Exertion (Exercise)  11       Perceived Dyspnea (Exercise)  0       Symptoms  none       Comments  walk test results          Exercise Comments: Exercise Comments    Row Name 01/11/20 0855           Exercise Comments  First full day of exercise!  Patient was oriented to gym and equipment including functions, settings, policies, and procedures.  Patient's individual exercise prescription and treatment plan were reviewed.  All starting workloads were established based on the results of the 6 minute walk test done at initial orientation visit.  The plan for exercise progression was also introduced and progression will be customized based on patient's performance and goals.          Exercise Goals and Review: Exercise Goals    Row Name 01/04/20 1230             Exercise Goals   Increase Physical Activity  Yes       Intervention  Provide advice, education, support and counseling about physical activity/exercise needs.;Develop an  individualized exercise prescription for aerobic and resistive training based on initial evaluation findings, risk stratification, comorbidities and participant's personal goals.       Expected Outcomes  Short Term: Attend rehab on a regular basis to increase amount of physical activity.;Long Term: Add in home exercise to make exercise part of routine and to increase amount of physical activity.;Long Term: Exercising regularly at least 3-5 days a week.       Increase Strength and Stamina  Yes       Intervention  Provide advice, education, support and counseling about physical activity/exercise needs.;Develop an individualized exercise prescription for aerobic and resistive training based on initial evaluation findings, risk stratification, comorbidities and participant's personal goals.       Expected Outcomes  Short Term: Increase workloads from initial exercise prescription for resistance, speed, and METs.;Short Term: Perform resistance training exercises routinely during rehab and add in resistance training at home;Long Term: Improve cardiorespiratory fitness, muscular endurance and strength as measured by increased METs and functional capacity (6MWT)       Able to understand and use rate of perceived exertion (RPE) scale  Yes       Intervention  Provide education and explanation on how to use RPE scale       Expected Outcomes  Short Term: Able to use RPE daily in rehab to express subjective intensity level;Long Term:  Able to use RPE to guide intensity level when exercising independently       Able to understand and use Dyspnea scale  Yes       Intervention  Provide education and explanation on how to use Dyspnea scale       Expected Outcomes  Short Term: Able to use Dyspnea scale daily in rehab to express subjective sense of shortness of breath during exertion;Long Term: Able to use Dyspnea scale to guide intensity level when exercising independently       Knowledge and understanding of Target Heart  Rate Range (THRR)  Yes       Intervention  Provide education and explanation of THRR including how the numbers were predicted and where they are located for reference  Expected Outcomes  Short Term: Able to state/look up THRR;Short Term: Able to use daily as guideline for intensity in rehab;Long Term: Able to use THRR to govern intensity when exercising independently       Able to check pulse independently  Yes       Intervention  Provide education and demonstration on how to check pulse in carotid and radial arteries.;Review the importance of being able to check your own pulse for safety during independent exercise       Expected Outcomes  Short Term: Able to explain why pulse checking is important during independent exercise;Long Term: Able to check pulse independently and accurately       Understanding of Exercise Prescription  Yes       Intervention  Provide education, explanation, and written materials on patient's individual exercise prescription       Expected Outcomes  Short Term: Able to explain program exercise prescription;Long Term: Able to explain home exercise prescription to exercise independently          Exercise Goals Re-Evaluation : Exercise Goals Re-Evaluation    Row Name 01/11/20 0855             Exercise Goal Re-Evaluation   Exercise Goals Review  Able to understand and use rate of perceived exertion (RPE) scale;Able to understand and use Dyspnea scale;Knowledge and understanding of Target Heart Rate Range (THRR);Understanding of Exercise Prescription       Comments  Reviewed RPE scale, THR and program prescription with pt today.  Pt voiced understanding and was given a copy of goals to take home. Short: Use RPE  daily to regulate intensity. Long: Follow program prescription in THR.       Expected Outcomes  Short: Use RPE daily to regulate intensity. Long: Follow program prescription in THR.          Discharge Exercise Prescription (Final Exercise Prescription  Changes): Exercise Prescription Changes - 01/04/20 1200      Response to Exercise   Blood Pressure (Admit)  128/70    Blood Pressure (Exercise)  134/70    Blood Pressure (Exit)  112/64    Heart Rate (Admit)  69 bpm    Heart Rate (Exercise)  92 bpm    Heart Rate (Exit)  65 bpm    Oxygen Saturation (Admit)  97 %    Oxygen Saturation (Exercise)  95 %    Oxygen Saturation (Exit)  98 %    Rating of Perceived Exertion (Exercise)  11    Perceived Dyspnea (Exercise)  0    Symptoms  none    Comments  walk test results       Nutrition:  Target Goals: Understanding of nutrition guidelines, daily intake of sodium <1524m, cholesterol <2078m calories 30% from fat and 7% or less from saturated fats, daily to have 5 or more servings of fruits and vegetables.  Biometrics: Pre Biometrics - 01/04/20 1231      Pre Biometrics   Height  5' 6.75" (1.695 m)    Weight  190 lb 3.2 oz (86.3 kg)    BMI (Calculated)  30.03    Single Leg Stand  6.62 seconds        Nutrition Therapy Plan and Nutrition Goals:   Nutrition Assessments: Nutrition Assessments - 01/04/20 1231      MEDFICTS Scores   Pre Score  18       Nutrition Goals Re-Evaluation:   Nutrition Goals Discharge (Final Nutrition Goals Re-Evaluation):   Psychosocial: Target Goals:  Acknowledge presence or absence of significant depression and/or stress, maximize coping skills, provide positive support system. Participant is able to verbalize types and ability to use techniques and skills needed for reducing stress and depression.   Initial Review & Psychosocial Screening: Initial Psych Review & Screening - 12/28/19 1411      Initial Review   Current issues with  Current Sleep Concerns    Comments  Milena states that she has never had very good sleep.      Family Dynamics   Good Support System?  Yes    Comments  She can talk to her husband and friedns for support      Barriers   Psychosocial barriers to participate in  program  The patient should benefit from training in stress management and relaxation.      Screening Interventions   Interventions  Encouraged to exercise;Provide feedback about the scores to participant;To provide support and resources with identified psychosocial needs    Expected Outcomes  Short Term goal: Utilizing psychosocial counselor, staff and physician to assist with identification of specific Stressors or current issues interfering with healing process. Setting desired goal for each stressor or current issue identified.;Long Term Goal: Stressors or current issues are controlled or eliminated.;Short Term goal: Identification and review with participant of any Quality of Life or Depression concerns found by scoring the questionnaire.;Long Term goal: The participant improves quality of Life and PHQ9 Scores as seen by post scores and/or verbalization of changes       Quality of Life Scores:  Scores of 19 and below usually indicate a poorer quality of life in these areas.  A difference of  2-3 points is a clinically meaningful difference.  A difference of 2-3 points in the total score of the Quality of Life Index has been associated with significant improvement in overall quality of life, self-image, physical symptoms, and general health in studies assessing change in quality of life.  PHQ-9: Recent Review Flowsheet Data    Depression screen [facility] 2/9 01/04/2020 05/18/2019 12/31/2018 10/03/2017 09/16/2017   Decreased Interest 0 1 0 0 0   Down, Depressed, Hopeless 0 0 0 0 0   PHQ - 2 Score 0 1 0 0 0   Altered sleeping 3 0 - 3 -   Tired, decreased energy 1 1 - 1 -   Change in appetite 0 0 - 0 -   Feeling bad or failure about yourself  0 0 - 0 -   Trouble concentrating 0 0 - 0 -   Moving slowly or fidgety/restless 0 0 - 0 -   Suicidal thoughts 0 0 - 0 -   PHQ-9 Score 4 2 - 4 -   Difficult doing work/chores Not difficult at all Not difficult at all - Not difficult at all -      Interpretation of Total Score  Total Score Depression Severity:  1-4 = Minimal depression, 5-9 = Mild depression, 10-14 = Moderate depression, 15-19 = Moderately severe depression, 20-27 = Severe depression   Psychosocial Evaluation and Intervention: Psychosocial Evaluation - 12/28/19 1414      Psychosocial Evaluation & Interventions   Interventions  Encouraged to exercise with the program and follow exercise prescription    Comments  She has a positive outlook on the program and whats she needs to do to lose weight and be less short of breath/    Expected Outcomes  Short: Attend [facility] stress management education to decrease stress. Long: Maintain exercise Post [facility] to  keep stress at a minimum.    Continue Psychosocial Services   Follow up required by staff       Psychosocial Re-Evaluation:   Psychosocial Discharge (Final Psychosocial Re-Evaluation):   Education: Education Goals: Education classes will be provided on a weekly basis, covering required topics. Participant will state understanding/return demonstration of topics presented.  Learning Barriers/Preferences: Learning Barriers/Preferences - 12/28/19 1412      Learning Barriers/Preferences   Learning Barriers  None    Learning Preferences  None       Education Topics:  Initial Evaluation Education: - Verbal, written and demonstration of respiratory meds, oximetry and breathing techniques. Instruction on use of nebulizers and MDIs and importance of monitoring MDI activations.   Pulmonary Rehab from 01/04/2020 in [facility] Cardiac and Pulmonary Rehab  Date  01/04/20  Educator  [facility]  Instruction Review Code  1- Verbalizes Understanding      General Nutrition Guidelines/Fats and Fiber: -Group instruction provided by verbal, written material, models and posters to present the general guidelines for heart healthy nutrition. Gives an explanation and review of dietary fats and fiber.   Controlling Sodium/Reading  Food Labels: -Group verbal and written material supporting the discussion of sodium use in heart healthy nutrition. Review and explanation with models, verbal and written materials for utilization of the food label.   Exercise Physiology & General Exercise Guidelines: - Group verbal and written instruction with models to review the exercise physiology of the cardiovascular system and associated critical values. Provides general exercise guidelines with specific guidelines to those with heart or lung disease.    Aerobic Exercise & Resistance Training: - Gives group verbal and written instruction on the various components of exercise. Focuses on aerobic and resistive training programs and the benefits of this training and how to safely progress through these programs.   Cardiac Rehab from 08/26/2019 in [facility] Cardiac and Pulmonary Rehab  Date  08/26/19  Educator  [facility]  Instruction Review Code  1- Verbalizes Understanding      Flexibility, Balance, Mind/Body Relaxation: Provides group verbal/written instruction on the benefits of flexibility and balance training, including mind/body exercise modes such as yoga, pilates and tai chi.  Demonstration and skill practice provided.   Stress and Anxiety: - Provides group verbal and written instruction about the health risks of elevated stress and causes of high stress.  Discuss the correlation between heart/lung disease and anxiety and treatment options. Review healthy ways to manage with stress and anxiety.   Depression: - Provides group verbal and written instruction on the correlation between heart/lung disease and depressed mood, treatment options, and the stigmas associated with seeking treatment.   Exercise & Equipment Safety: - Individual verbal instruction and demonstration of equipment use and safety with use of the equipment.   Pulmonary Rehab from 12/28/2019 in [facility] Cardiac and Pulmonary Rehab  Date  12/28/19  Educator  [facility]  Instruction  Review Code  1- Verbalizes Understanding      Infection Prevention: - Provides verbal and written material to individual with discussion of infection control including proper hand washing and proper equipment cleaning during exercise session.   Pulmonary Rehab from 12/28/2019 in [facility] Cardiac and Pulmonary Rehab  Date  12/28/19  Educator  [facility]  Instruction Review Code  1- Verbalizes Understanding      Falls Prevention: - Provides verbal and written material to individual with discussion of falls prevention and safety.   Pulmonary Rehab from 12/28/2019 in [facility] Cardiac and Pulmonary Rehab  Date  12/28/19  Educator  [facility]  Instruction Review Code  1- Verbalizes Understanding      Diabetes: - Individual verbal and written instruction to review signs/symptoms of diabetes, desired ranges of glucose level fasting, after meals and with exercise. Advice that pre and post exercise glucose checks will be done for 3 sessions at entry of program.   Chronic Lung Diseases: - Group verbal and written instruction to review updates, respiratory medications, advancements in procedures and treatments. Discuss use of supplemental oxygen including available portable oxygen systems, continuous and intermittent flow rates, concentrators, personal use and safety guidelines. Review proper use of inhaler and spacers. Provide informative websites for self-education.    Energy Conservation: - Provide group verbal and written instruction for methods to conserve energy, plan and organize activities. Instruct on pacing techniques, use of adaptive equipment and posture/positioning to relieve shortness of breath.   Triggers and Exacerbations: - Group verbal and written instruction to review types of environmental triggers and ways to prevent exacerbations. Discuss weather changes, air quality and the benefits of nasal washing. Review warning signs and symptoms to help prevent infections. Discuss techniques for effective  airway clearance, coughing, and vibrations.   AED/CPR: - Group verbal and written instruction with the use of models to demonstrate the basic use of the AED with the basic ABC's of resuscitation.   Anatomy and Physiology of the Lungs: - Group verbal and written instruction with the use of models to provide basic lung anatomy and physiology related to function, structure and complications of lung disease.   Anatomy & Physiology of the Heart: - Group verbal and written instruction and models provide basic cardiac anatomy and physiology, with the coronary electrical and arterial systems. Review of Valvular disease and Heart Failure   Cardiac Medications: - Group verbal and written instruction to review commonly prescribed medications for heart disease. Reviews the medication, class of the drug, and side effects.   Know Your Numbers and Risk Factors: -Group verbal and written instruction about important numbers in your health.  Discussion of what are risk factors and how they play a role in the disease process.  Review of Cholesterol, Blood Pressure, Diabetes, and BMI and the role they play in your overall health.   Cardiac Rehab from 08/12/2019 in [facility] Cardiac and Pulmonary Rehab  Date  08/12/19  Educator  SB  Instruction Review Code  1- Verbalizes Understanding      Sleep Hygiene: -Provides group verbal and written instruction about how sleep can affect your health.  Define sleep hygiene, discuss sleep cycles and impact of sleep habits. Review good sleep hygiene tips.    Other: -Provides group and verbal instruction on various topics (see comments)    Knowledge Questionnaire Score: Knowledge Questionnaire Score - 01/04/20 1232      Knowledge Questionnaire Score   Pre Score  15/18 Education Focus: COPD,        Core Components/Risk Factors/Patient Goals at Admission: Personal Goals and Risk Factors at Admission - 01/04/20 1250      Core Components/Risk Factors/Patient  Goals on Admission    Weight Management  Yes;Weight Loss    Intervention  Weight Management: Develop a combined nutrition and exercise program designed to reach desired caloric intake, while maintaining appropriate intake of nutrient and fiber, sodium and fats, and appropriate energy expenditure required for the weight goal.;Weight Management: Provide education and appropriate resources to help participant work on and attain dietary goals.    Admit Weight  190 lb 3.2 oz (86.3 kg)  Goal Weight: Short Term  185 lb (83.9 kg)    Goal Weight: Long Term  180 lb (81.6 kg)    Expected Outcomes  Short Term: Continue to assess and modify interventions until short term weight is achieved;Long Term: Adherence to nutrition and physical activity/exercise program aimed toward attainment of established weight goal;Weight Loss: Understanding of general recommendations for a balanced deficit meal plan, which promotes 1-2 lb weight loss per week and includes a negative energy balance of [id number] kcal/d;Understanding recommendations for meals to include 15-35% energy as protein, 25-35% energy from fat, 35-60% energy from carbohydrates, less than 211m of dietary cholesterol, 20-35 gm of total fiber daily;Understanding of distribution of calorie intake throughout the day with the consumption of 4-5 meals/snacks    Improve shortness of breath with ADL's  Yes    Intervention  Provide education, individualized exercise plan and daily activity instruction to help decrease symptoms of SOB with activities of daily living.    Expected Outcomes  Long Term: Be able to perform more ADLs without symptoms or delay the onset of symptoms;Short Term: Improve cardiorespiratory fitness to achieve a reduction of symptoms when performing ADLs    Lipids  Yes    Intervention  Provide education and support for participant on nutrition & aerobic/resistive exercise along with prescribed medications to achieve LDL <766m HDL >4011m   Expected  Outcomes  Short Term: Participant states understanding of desired cholesterol values and is compliant with medications prescribed. Participant is following exercise prescription and nutrition guidelines.;Long Term: Cholesterol controlled with medications as prescribed, with individualized exercise RX and with personalized nutrition plan. Value goals: LDL < 36m78mDL > 40 mg.       Core Components/Risk Factors/Patient Goals Review:    Core Components/Risk Factors/Patient Goals at Discharge (Final Review):    ITP Comments: ITP Comments    Row Name 12/28/19 1421 01/04/20 1224 01/11/20 0855 01/13/20 [id number] ITP Comments  Virtual Orientation performed. Patient informed when to come in for RD and EP orientation. Diagnosis can be found in [facility]02/2021  Completed 6MWT and gym orientation.  Initial ITP created and sent for review to Dr. MarkEmily Filbertdical Director.  First full day of exercise!  Patient was oriented to gym and equipment including functions, settings, policies, and procedures.  Patient's individual exercise prescription and treatment plan were reviewed.  All starting workloads were established based on the results of the 6 minute walk test done at initial orientation visit.  The plan for exercise progression was also introduced and progression will be customized based on patient's performance and goals.  30 day chart review completed. ITP sent to Dr M MiZachery Dakinsical Director, for review,changes as needed and signature. Continue with ITP if no changes requested New to program       Comments:

## 2020-01-13 NOTE — Progress Notes (Signed)
Daily Session Note  Patient Details  Name: Jessica [name] MRN: [id number] Date of Birth: 05-Feb-1951 Referring Provider:     Pulmonary Rehab from 01/04/2020 in [facility] Cardiac and Pulmonary Rehab  Referring Provider  Quay Burow MD      Encounter Date: 01/13/2020  Check In: Session Check In - 01/13/20 0907      Check-In   Supervising physician immediately available to respond to emergencies  See telemetry face sheet for immediately available ER MD    Location  [facility]-Cardiac & Pulmonary Rehab    Staff Present  Heath Lark, RN, BSN, CCRP;Amanda Sommer, BA, ACSM CEP, Exercise Physiologist;Joseph Hood RCP,RRT,BSRT    Virtual Visit  No    Medication changes reported      No    Fall or balance concerns reported     No    Warm-up and Cool-down  Performed on first and last piece of equipment    Resistance Training Performed  Yes    VAD Patient?  No    PAD/SET Patient?  No      Pain Assessment   Currently in Pain?  No/denies          Social History   Tobacco Use  Smoking Status Never Smoker  Smokeless Tobacco Never Used    Goals Met:  Independence with exercise equipment Exercise tolerated well No report of cardiac concerns or symptoms  Goals Unmet:  Not Applicable  Comments: Pt able to follow exercise prescription today without complaint.  Will continue to monitor for progression.    Dr. Emily Filbert is Medical Director for [facility] and [facility].

## 2020-01-15 ENCOUNTER — Encounter: Payer: Medicare PPO | Admitting: *Deleted

## 2020-01-15 ENCOUNTER — Other Ambulatory Visit: Payer: Self-pay

## 2020-01-15 DIAGNOSIS — R0600 Dyspnea, unspecified: Secondary | ICD-10-CM | POA: Diagnosis not present

## 2020-01-15 DIAGNOSIS — E785 Hyperlipidemia, unspecified: Secondary | ICD-10-CM | POA: Diagnosis not present

## 2020-01-15 DIAGNOSIS — I2510 Atherosclerotic heart disease of native coronary artery without angina pectoris: Secondary | ICD-10-CM | POA: Diagnosis not present

## 2020-01-15 DIAGNOSIS — K219 Gastro-esophageal reflux disease without esophagitis: Secondary | ICD-10-CM | POA: Diagnosis not present

## 2020-01-15 DIAGNOSIS — Z7982 Long term (current) use of aspirin: Secondary | ICD-10-CM | POA: Diagnosis not present

## 2020-01-15 DIAGNOSIS — Z79899 Other long term (current) drug therapy: Secondary | ICD-10-CM | POA: Diagnosis not present

## 2020-01-15 DIAGNOSIS — R0609 Other forms of dyspnea: Secondary | ICD-10-CM

## 2020-01-15 DIAGNOSIS — Z923 Personal history of irradiation: Secondary | ICD-10-CM | POA: Diagnosis not present

## 2020-01-15 DIAGNOSIS — Z853 Personal history of malignant neoplasm of breast: Secondary | ICD-10-CM | POA: Diagnosis not present

## 2020-01-15 NOTE — Progress Notes (Signed)
Daily Session Note  Patient Details  Name: Jessica [name] MRN: [id number] Date of Birth: 1950/12/19 Referring Provider:     Pulmonary Rehab from 01/04/2020 in [facility] Cardiac and Pulmonary Rehab  Referring Provider  Quay Burow MD      Encounter Date: 01/15/2020  Check In: Session Check In - 01/15/20 0845      Check-In   Supervising physician immediately available to respond to emergencies  See telemetry face sheet for immediately available ER MD    Location  [facility]-Cardiac & Pulmonary Rehab    Staff Present  Heath Lark, RN, BSN, CCRP;Jessica [place], MA, RCEP, CCRP, CCET;Joseph Maltby RCP,RRT,BSRT    Virtual Visit  No    Medication changes reported      No    Fall or balance concerns reported     No    Warm-up and Cool-down  Performed on first and last piece of equipment    Resistance Training Performed  Yes    VAD Patient?  No    PAD/SET Patient?  No      Pain Assessment   Currently in Pain?  No/denies          Social History   Tobacco Use  Smoking Status Never Smoker  Smokeless Tobacco Never Used    Goals Met:  Proper associated with RPD/PD & O2 Sat Independence with exercise equipment Exercise tolerated well No report of cardiac concerns or symptoms  Goals Unmet:  Not Applicable  Comments: Pt able to follow exercise prescription today without complaint.  Will continue to monitor for progression.    Dr. Emily Filbert is Medical Director for [place] and [facility].

## 2020-01-18 ENCOUNTER — Encounter: Payer: Medicare PPO | Admitting: *Deleted

## 2020-01-18 ENCOUNTER — Other Ambulatory Visit: Payer: Self-pay

## 2020-01-18 DIAGNOSIS — Z853 Personal history of malignant neoplasm of breast: Secondary | ICD-10-CM | POA: Diagnosis not present

## 2020-01-18 DIAGNOSIS — Z923 Personal history of irradiation: Secondary | ICD-10-CM | POA: Diagnosis not present

## 2020-01-18 DIAGNOSIS — K219 Gastro-esophageal reflux disease without esophagitis: Secondary | ICD-10-CM | POA: Diagnosis not present

## 2020-01-18 DIAGNOSIS — Z7982 Long term (current) use of aspirin: Secondary | ICD-10-CM | POA: Diagnosis not present

## 2020-01-18 DIAGNOSIS — R0609 Other forms of dyspnea: Secondary | ICD-10-CM

## 2020-01-18 DIAGNOSIS — R0600 Dyspnea, unspecified: Secondary | ICD-10-CM | POA: Diagnosis not present

## 2020-01-18 DIAGNOSIS — E785 Hyperlipidemia, unspecified: Secondary | ICD-10-CM | POA: Diagnosis not present

## 2020-01-18 DIAGNOSIS — Z79899 Other long term (current) drug therapy: Secondary | ICD-10-CM | POA: Diagnosis not present

## 2020-01-18 DIAGNOSIS — I2510 Atherosclerotic heart disease of native coronary artery without angina pectoris: Secondary | ICD-10-CM | POA: Diagnosis not present

## 2020-01-18 NOTE — Progress Notes (Signed)
Daily Session Note  Patient Details  Name: Jessica [name] MRN: [id number] Date of Birth: [birth year] Referring Provider:     Pulmonary Rehab from 01/04/2020 in [facility] Cardiac and Pulmonary Rehab  Referring Provider  Quay Burow MD      Encounter Date: 01/18/2020  Check In: Session Check In - 01/18/20 0804      Check-In   Supervising physician immediately available to respond to emergencies  See telemetry face sheet for immediately available ER MD    Location  [facility]-Cardiac & Pulmonary Rehab    Staff Present  Heath Lark, RN, BSN, Laveda Norman, BS, ACSM CEP, Exercise Physiologist;Joseph Tessie Fass RCP,RRT,BSRT    Virtual Visit  No    Medication changes reported      No    Fall or balance concerns reported     No    Warm-up and Cool-down  Performed on first and last piece of equipment    Resistance Training Performed  Yes    VAD Patient?  No    PAD/SET Patient?  No      Pain Assessment   Currently in Pain?  No/denies          Social History   Tobacco Use  Smoking Status Never Smoker  Smokeless Tobacco Never Used    Goals Met:  Proper associated with RPD/PD & O2 Sat Independence with exercise equipment Exercise tolerated well Personal goals reviewed No report of cardiac concerns or symptoms  Goals Unmet:  Not Applicable  Comments: Pt able to follow exercise prescription today without complaint.  Will continue to monitor for progression.    Dr. Emily Filbert is Medical Director for [place] and [facility].

## 2020-01-22 ENCOUNTER — Encounter: Payer: Medicare PPO | Attending: Cardiovascular Disease | Admitting: *Deleted

## 2020-01-22 ENCOUNTER — Other Ambulatory Visit: Payer: Self-pay

## 2020-01-22 DIAGNOSIS — Z853 Personal history of malignant neoplasm of breast: Secondary | ICD-10-CM | POA: Insufficient documentation

## 2020-01-22 DIAGNOSIS — Z7982 Long term (current) use of aspirin: Secondary | ICD-10-CM | POA: Insufficient documentation

## 2020-01-22 DIAGNOSIS — E785 Hyperlipidemia, unspecified: Secondary | ICD-10-CM | POA: Diagnosis not present

## 2020-01-22 DIAGNOSIS — I2510 Atherosclerotic heart disease of native coronary artery without angina pectoris: Secondary | ICD-10-CM | POA: Diagnosis not present

## 2020-01-22 DIAGNOSIS — K219 Gastro-esophageal reflux disease without esophagitis: Secondary | ICD-10-CM | POA: Insufficient documentation

## 2020-01-22 DIAGNOSIS — Z79899 Other long term (current) drug therapy: Secondary | ICD-10-CM | POA: Diagnosis not present

## 2020-01-22 DIAGNOSIS — R0600 Dyspnea, unspecified: Secondary | ICD-10-CM | POA: Diagnosis not present

## 2020-01-22 DIAGNOSIS — Z923 Personal history of irradiation: Secondary | ICD-10-CM | POA: Diagnosis not present

## 2020-01-22 DIAGNOSIS — Z951 Presence of aortocoronary bypass graft: Secondary | ICD-10-CM

## 2020-01-22 DIAGNOSIS — R0609 Other forms of dyspnea: Secondary | ICD-10-CM

## 2020-01-22 NOTE — Progress Notes (Signed)
Daily Session Note  Patient Details  Name: Jessica [name] MRN: [id number] Date of Birth: 1951/09/04 Referring Provider:     Pulmonary Rehab from 01/04/2020 in [facility] Cardiac and Pulmonary Rehab  Referring Provider  Quay Burow MD      Encounter Date: 01/22/2020  Check In: Session Check In - 01/22/20 0907      Check-In   Supervising physician immediately available to respond to emergencies  See telemetry face sheet for immediately available ER MD    Location  [facility]-Cardiac & Pulmonary Rehab    Staff Present  Heath Lark, RN, BSN, CCRP;Meredith Sherryll Burger, RN BSN;Joseph Hood RCP,RRT,BSRT    Virtual Visit  No    Medication changes reported      No    Fall or balance concerns reported     No    Warm-up and Cool-down  Performed on first and last piece of equipment    Resistance Training Performed  Yes    VAD Patient?  No    PAD/SET Patient?  No      Pain Assessment   Currently in Pain?  No/denies          Social History   Tobacco Use  Smoking Status Never Smoker  Smokeless Tobacco Never Used    Goals Met:  Independence with exercise equipment Exercise tolerated well No report of cardiac concerns or symptoms  Goals Unmet:  Not Applicable  Comments: Pt able to follow exercise prescription today without complaint.  Will continue to monitor for progression.    Dr. Emily Filbert is Medical Director for [place] and [facility].

## 2020-01-26 ENCOUNTER — Encounter: Payer: Medicare PPO | Admitting: *Deleted

## 2020-01-26 ENCOUNTER — Other Ambulatory Visit: Payer: Self-pay

## 2020-01-26 ENCOUNTER — Encounter: Payer: Self-pay | Admitting: Internal Medicine

## 2020-01-26 DIAGNOSIS — Z79899 Other long term (current) drug therapy: Secondary | ICD-10-CM | POA: Diagnosis not present

## 2020-01-26 DIAGNOSIS — K219 Gastro-esophageal reflux disease without esophagitis: Secondary | ICD-10-CM | POA: Diagnosis not present

## 2020-01-26 DIAGNOSIS — Z853 Personal history of malignant neoplasm of breast: Secondary | ICD-10-CM | POA: Diagnosis not present

## 2020-01-26 DIAGNOSIS — Z923 Personal history of irradiation: Secondary | ICD-10-CM | POA: Diagnosis not present

## 2020-01-26 DIAGNOSIS — E785 Hyperlipidemia, unspecified: Secondary | ICD-10-CM | POA: Diagnosis not present

## 2020-01-26 DIAGNOSIS — I2510 Atherosclerotic heart disease of native coronary artery without angina pectoris: Secondary | ICD-10-CM | POA: Diagnosis not present

## 2020-01-26 DIAGNOSIS — R0609 Other forms of dyspnea: Secondary | ICD-10-CM

## 2020-01-26 DIAGNOSIS — R0600 Dyspnea, unspecified: Secondary | ICD-10-CM | POA: Diagnosis not present

## 2020-01-26 DIAGNOSIS — Z7982 Long term (current) use of aspirin: Secondary | ICD-10-CM | POA: Diagnosis not present

## 2020-01-26 NOTE — Progress Notes (Signed)
Daily Session Note  Patient Details  Name: Jessica [name] MRN: [id number] Date of Birth: [birth year] Referring Provider:     Pulmonary Rehab from 01/04/2020 in [facility] Cardiac and Pulmonary Rehab  Referring Provider  Quay Burow MD      Encounter Date: 01/26/2020  Check In: Session Check In - 01/26/20 0817      Check-In   Supervising physician immediately available to respond to emergencies  See telemetry face sheet for immediately available ER MD    Location  [facility]-Cardiac & Pulmonary Rehab    Staff Present  Heath Lark, RN, BSN, CCRP;Amanda Sommer, BA, ACSM CEP, Exercise Physiologist;Joseph Hood RCP,RRT,BSRT    Virtual Visit  No    Medication changes reported      No    Fall or balance concerns reported     No    Warm-up and Cool-down  Performed on first and last piece of equipment    Resistance Training Performed  Yes    VAD Patient?  No    PAD/SET Patient?  No      Pain Assessment   Currently in Pain?  No/denies          Social History   Tobacco Use  Smoking Status Never Smoker  Smokeless Tobacco Never Used    Goals Met:  Proper associated with RPD/PD & O2 Sat Independence with exercise equipment Exercise tolerated well No report of cardiac concerns or symptoms  Goals Unmet:  Not Applicable  Comments: Pt able to follow exercise prescription today without complaint.  Will continue to monitor for progression.    Dr. Emily Filbert is Medical Director for [place] and [facility].

## 2020-01-28 ENCOUNTER — Other Ambulatory Visit: Payer: Self-pay

## 2020-01-28 DIAGNOSIS — E785 Hyperlipidemia, unspecified: Secondary | ICD-10-CM | POA: Diagnosis not present

## 2020-01-28 DIAGNOSIS — R0609 Other forms of dyspnea: Secondary | ICD-10-CM

## 2020-01-28 DIAGNOSIS — Z7982 Long term (current) use of aspirin: Secondary | ICD-10-CM | POA: Diagnosis not present

## 2020-01-28 DIAGNOSIS — Z923 Personal history of irradiation: Secondary | ICD-10-CM | POA: Diagnosis not present

## 2020-01-28 DIAGNOSIS — Z853 Personal history of malignant neoplasm of breast: Secondary | ICD-10-CM | POA: Diagnosis not present

## 2020-01-28 DIAGNOSIS — Z79899 Other long term (current) drug therapy: Secondary | ICD-10-CM | POA: Diagnosis not present

## 2020-01-28 DIAGNOSIS — I2510 Atherosclerotic heart disease of native coronary artery without angina pectoris: Secondary | ICD-10-CM | POA: Diagnosis not present

## 2020-01-28 DIAGNOSIS — R0600 Dyspnea, unspecified: Secondary | ICD-10-CM | POA: Diagnosis not present

## 2020-01-28 DIAGNOSIS — K219 Gastro-esophageal reflux disease without esophagitis: Secondary | ICD-10-CM | POA: Diagnosis not present

## 2020-01-28 NOTE — Progress Notes (Signed)
Daily Session Note  Patient Details  Name: Jessica [name] MRN: [id number] Date of Birth: [birth year] Referring Provider:     Pulmonary Rehab from 01/04/2020 in [facility] Cardiac and Pulmonary Rehab  Referring Provider  Quay Burow MD      Encounter Date: 01/28/2020  Check In: Session Check In - 01/28/20 0820      Check-In   Supervising physician immediately available to respond to emergencies  See telemetry face sheet for immediately available ER MD    Location  [facility]-Cardiac & Pulmonary Rehab    Staff Present  Vida Rigger RN, Vickki Hearing, BA, ACSM CEP, Exercise Physiologist;Melissa Caiola RDN, LDN;Susanne Bice, RN, BSN, CCRP    Virtual Visit  No    Medication changes reported      No    Fall or balance concerns reported     No    Warm-up and Cool-down  Performed on first and last piece of equipment    Resistance Training Performed  Yes    VAD Patient?  No    PAD/SET Patient?  No      Pain Assessment   Currently in Pain?  No/denies          Social History   Tobacco Use  Smoking Status Never Smoker  Smokeless Tobacco Never Used    Goals Met:  Independence with exercise equipment Exercise tolerated well No report of cardiac concerns or symptoms Strength training completed today  Goals Unmet:  Not Applicable  Comments: Pt able to follow exercise prescription today without complaint.  Will continue to monitor for progression.   Dr. Emily Filbert is Medical Director for [place] and [facility].

## 2020-02-01 ENCOUNTER — Other Ambulatory Visit: Payer: Self-pay

## 2020-02-01 DIAGNOSIS — R0609 Other forms of dyspnea: Secondary | ICD-10-CM

## 2020-02-01 NOTE — Progress Notes (Signed)
Completed Initial RD Eval 

## 2020-02-02 ENCOUNTER — Other Ambulatory Visit: Payer: Self-pay

## 2020-02-02 ENCOUNTER — Encounter: Payer: Medicare PPO | Admitting: *Deleted

## 2020-02-02 DIAGNOSIS — R0609 Other forms of dyspnea: Secondary | ICD-10-CM

## 2020-02-02 DIAGNOSIS — Z79899 Other long term (current) drug therapy: Secondary | ICD-10-CM | POA: Diagnosis not present

## 2020-02-02 DIAGNOSIS — K219 Gastro-esophageal reflux disease without esophagitis: Secondary | ICD-10-CM | POA: Diagnosis not present

## 2020-02-02 DIAGNOSIS — I2510 Atherosclerotic heart disease of native coronary artery without angina pectoris: Secondary | ICD-10-CM | POA: Diagnosis not present

## 2020-02-02 DIAGNOSIS — Z7982 Long term (current) use of aspirin: Secondary | ICD-10-CM | POA: Diagnosis not present

## 2020-02-02 DIAGNOSIS — Z923 Personal history of irradiation: Secondary | ICD-10-CM | POA: Diagnosis not present

## 2020-02-02 DIAGNOSIS — R0600 Dyspnea, unspecified: Secondary | ICD-10-CM | POA: Diagnosis not present

## 2020-02-02 DIAGNOSIS — Z853 Personal history of malignant neoplasm of breast: Secondary | ICD-10-CM | POA: Diagnosis not present

## 2020-02-02 DIAGNOSIS — E785 Hyperlipidemia, unspecified: Secondary | ICD-10-CM | POA: Diagnosis not present

## 2020-02-02 NOTE — Progress Notes (Signed)
Daily Session Note  Patient Details  Name: Jessica [name] MRN: [id number] Date of Birth: April 04, 1951 Referring Provider:     Pulmonary Rehab from 01/04/2020 in [facility] Cardiac and Pulmonary Rehab  Referring Provider  Quay Burow MD      Encounter Date: 02/02/2020  Check In: Session Check In - 02/02/20 0828      Check-In   Supervising physician immediately available to respond to emergencies  See telemetry face sheet for immediately available ER MD    Location  [facility]-Cardiac & Pulmonary Rehab    Staff Present  Heath Lark, RN, BSN, CCRP;Amanda Sommer, BA, ACSM CEP, Exercise Physiologist;Joseph Hood RCP,RRT,BSRT    Virtual Visit  No    Medication changes reported      No    Fall or balance concerns reported     No    Warm-up and Cool-down  Performed on first and last piece of equipment    Resistance Training Performed  Yes    VAD Patient?  No    PAD/SET Patient?  No      Pain Assessment   Currently in Pain?  No/denies          Social History   Tobacco Use  Smoking Status Never Smoker  Smokeless Tobacco Never Used    Goals Met:  Proper associated with RPD/PD & O2 Sat Independence with exercise equipment Exercise tolerated well No report of cardiac concerns or symptoms  Goals Unmet:  Not Applicable  Comments: Pt able to follow exercise prescription today without complaint.  Will continue to monitor for progression.    Dr. Emily Filbert is Medical Director for [place] and [facility].

## 2020-02-04 ENCOUNTER — Encounter: Payer: Medicare PPO | Admitting: *Deleted

## 2020-02-04 ENCOUNTER — Other Ambulatory Visit: Payer: Self-pay

## 2020-02-04 DIAGNOSIS — Z7982 Long term (current) use of aspirin: Secondary | ICD-10-CM | POA: Diagnosis not present

## 2020-02-04 DIAGNOSIS — Z853 Personal history of malignant neoplasm of breast: Secondary | ICD-10-CM | POA: Diagnosis not present

## 2020-02-04 DIAGNOSIS — K219 Gastro-esophageal reflux disease without esophagitis: Secondary | ICD-10-CM | POA: Diagnosis not present

## 2020-02-04 DIAGNOSIS — Z79899 Other long term (current) drug therapy: Secondary | ICD-10-CM | POA: Diagnosis not present

## 2020-02-04 DIAGNOSIS — R0609 Other forms of dyspnea: Secondary | ICD-10-CM

## 2020-02-04 DIAGNOSIS — E785 Hyperlipidemia, unspecified: Secondary | ICD-10-CM | POA: Diagnosis not present

## 2020-02-04 DIAGNOSIS — Z923 Personal history of irradiation: Secondary | ICD-10-CM | POA: Diagnosis not present

## 2020-02-04 DIAGNOSIS — I2510 Atherosclerotic heart disease of native coronary artery without angina pectoris: Secondary | ICD-10-CM | POA: Diagnosis not present

## 2020-02-04 DIAGNOSIS — R0600 Dyspnea, unspecified: Secondary | ICD-10-CM | POA: Diagnosis not present

## 2020-02-04 NOTE — Progress Notes (Signed)
Daily Session Note  Patient Details  Name: Jessica [name] MRN: [id number] Date of Birth: 1951-09-21 Referring Provider:     Pulmonary Rehab from 01/04/2020 in [facility] Cardiac and Pulmonary Rehab  Referring Provider  Quay Burow MD      Encounter Date: 02/04/2020  Check In:      Social History   Tobacco Use  Smoking Status Never Smoker  Smokeless Tobacco Never Used    Goals Met:  Proper associated with RPD/PD & O2 Sat Independence with exercise equipment Exercise tolerated well No report of cardiac concerns or symptoms  Goals Unmet:  Not Applicable  Comments: Pt able to follow exercise prescription today without complaint.  Will continue to monitor for progression.    Dr. Emily Filbert is Medical Director for [place] and [facility].

## 2020-02-08 DIAGNOSIS — L816 Other disorders of diminished melanin formation: Secondary | ICD-10-CM | POA: Diagnosis not present

## 2020-02-08 DIAGNOSIS — L659 Nonscarring hair loss, unspecified: Secondary | ICD-10-CM | POA: Diagnosis not present

## 2020-02-09 ENCOUNTER — Other Ambulatory Visit: Payer: Self-pay

## 2020-02-09 ENCOUNTER — Encounter: Payer: Medicare PPO | Admitting: *Deleted

## 2020-02-09 DIAGNOSIS — E785 Hyperlipidemia, unspecified: Secondary | ICD-10-CM | POA: Diagnosis not present

## 2020-02-09 DIAGNOSIS — Z7982 Long term (current) use of aspirin: Secondary | ICD-10-CM | POA: Diagnosis not present

## 2020-02-09 DIAGNOSIS — Z923 Personal history of irradiation: Secondary | ICD-10-CM | POA: Diagnosis not present

## 2020-02-09 DIAGNOSIS — R0600 Dyspnea, unspecified: Secondary | ICD-10-CM | POA: Diagnosis not present

## 2020-02-09 DIAGNOSIS — K219 Gastro-esophageal reflux disease without esophagitis: Secondary | ICD-10-CM | POA: Diagnosis not present

## 2020-02-09 DIAGNOSIS — Z79899 Other long term (current) drug therapy: Secondary | ICD-10-CM | POA: Diagnosis not present

## 2020-02-09 DIAGNOSIS — Z853 Personal history of malignant neoplasm of breast: Secondary | ICD-10-CM | POA: Diagnosis not present

## 2020-02-09 DIAGNOSIS — R0609 Other forms of dyspnea: Secondary | ICD-10-CM

## 2020-02-09 DIAGNOSIS — I2510 Atherosclerotic heart disease of native coronary artery without angina pectoris: Secondary | ICD-10-CM | POA: Diagnosis not present

## 2020-02-09 NOTE — Progress Notes (Signed)
Daily Session Note  Patient Details  Name: Shanquita Ronning MRN: [id number] Date of Birth: 08-13-1951 Referring Provider:     Pulmonary Rehab from 01/04/2020 in [facility] Cardiac and Pulmonary Rehab  Referring Provider  Quay Burow MD      Encounter Date: 02/09/2020  Check In: Session Check In - 02/09/20 0858      Check-In   Supervising physician immediately available to respond to emergencies  See telemetry face sheet for immediately available ER MD    Location  [facility]-Cardiac & Pulmonary Rehab    Staff Present  Heath Lark, RN, BSN, Lance Sell, BA, ACSM CEP, Exercise Physiologist;Joseph Hood RCP,RRT,BSRT    Virtual Visit  No    Medication changes reported      No    Fall or balance concerns reported     No    Warm-up and Cool-down  Performed on first and last piece of equipment    Resistance Training Performed  Yes    VAD Patient?  No    PAD/SET Patient?  No      Pain Assessment   Currently in Pain?  No/denies          Social History   Tobacco Use  Smoking Status Never Smoker  Smokeless Tobacco Never Used    Goals Met:  Proper associated with RPD/PD & O2 Sat Independence with exercise equipment Exercise tolerated well No report of cardiac concerns or symptoms  Goals Unmet:  Not Applicable  Comments: Pt able to follow exercise prescription today without complaint.  Will continue to monitor for progression.    Dr. Emily Filbert is Medical Director for [place] and [facility].

## 2020-02-10 ENCOUNTER — Encounter: Payer: Self-pay | Admitting: *Deleted

## 2020-02-10 DIAGNOSIS — R0609 Other forms of dyspnea: Secondary | ICD-10-CM

## 2020-02-10 NOTE — Progress Notes (Signed)
Pulmonary Individual Treatment Plan  Patient Details  Name: Jessica [name] MRN: [id number] Date of Birth: 1951/07/06 Referring Provider:     Pulmonary Rehab from 01/04/2020 in [facility] Cardiac and Pulmonary Rehab  Referring Provider  Quay Burow MD      Initial Encounter Date:    Pulmonary Rehab from 01/04/2020 in [facility] Cardiac and Pulmonary Rehab  Date  01/04/20      Visit Diagnosis: Dyspnea on exertion  Patient's Home Medications on Admission:  Current Outpatient Medications:  .  Ascorbic Acid (VITAMIN C) 1000 MG tablet, Take 2,000 mg by mouth daily., Disp: , Rfl:  .  aspirin EC 325 MG EC tablet, Take 1 tablet (325 mg total) by mouth daily. (Patient taking differently: Take 325 mg by mouth at bedtime. ), Disp: , Rfl: 0 .  Cholecalciferol (VITAMIN D-3) 125 MCG (5000 UT) TABS, Take 5,000 Units by mouth daily., Disp: , Rfl:  .  Evolocumab (REPATHA SURECLICK) 762 MG/ML SOAJ, Inject 140 mg into the skin every 14 (fourteen) days., Disp: 2 pen, Rfl: 11 .  hydrocortisone (ANUSOL-HC) 2.5 % rectal cream, Place 1 application rectally 2 (two) times daily., Disp: 30 g, Rfl: 6 .  loratadine (CLARITIN) 10 MG tablet, Take 10 mg by mouth every evening. , Disp: , Rfl:  .  metoprolol tartrate (LOPRESSOR) 25 MG tablet, Take 0.5 tablets (12.5 mg total) by mouth daily., Disp: 30 tablet, Rfl: 11 .  Multiple Vitamin (MULTIVITAMIN WITH MINERALS) TABS tablet, Take 1 tablet by mouth daily., Disp: , Rfl:   Past Medical History: Past Medical History:  Diagnosis Date  . Allergy   . Breast cancer ([place]) 2000  . Cancer [facility])    breast  . Colon polyp   . Coronary artery disease   . GERD (gastroesophageal reflux disease)   . History of blood transfusion   . Hyperlipidemia   . Migraine    history of migrarines, none as an adult  . Personal history of radiation therapy 2000    Tobacco Use: Social History   Tobacco Use  Smoking Status Never Smoker  Smokeless Tobacco Never Used    Labs: Recent  Review Flowsheet Data    Labs for ITP Cardiac and Pulmonary Rehab Latest Ref Rng & Units 01/05/2019 01/05/2019 03/12/2019 06/16/2019 09/21/2019   Cholestrol 0 - 200 mg/dL - - 113 200 115   LDLCALC 0 - 99 mg/dL - - 63 145(H) 62   HDL >39.00 mg/dL - - 33(L) 33.20(L) 37.90(L)   Trlycerides 0.0 - 149.0 mg/dL - - 83 109.0 76.0   Hemoglobin A1c 4.6 - 6.5 % - - - 6.4 5.9   PHART 7.350 - 7.450 7.315(L) 7.265(L) - - -   PCO2ART 32.0 - 48.0 mmHg 47.1 49.9(H) - - -   HCO3 20.0 - 28.0 mmol/L 23.9 22.6 - - -   TCO2 22 - 32 mmol/L 25 24 - - -   ACIDBASEDEF 0.0 - 2.0 mmol/L 2.0 4.0(H) - - -   O2SAT % 97.0 98.0 - - -       Pulmonary Assessment Scores: Pulmonary Assessment Scores    Row Name 01/04/20 1251         ADL UCSD   ADL Phase  Entry     SOB Score total  9     Rest  0     Walk  1     Stairs  4     Bath  0     Dress  0  Shop  0       CAT Score   CAT Score  7       mMRC Score   mMRC Score  1        UCSD: Self-administered rating of dyspnea associated with activities of daily living (ADLs) 6-point scale (0 = "not at all" to 5 = "maximal or unable to do because of breathlessness")  Scoring Scores range from 0 to 120.  Minimally important difference is 5 units  CAT: CAT can identify the health impairment of COPD patients and is better correlated with disease progression.  CAT has a scoring range of zero to 40. The CAT score is classified into four groups of low (less than 10), medium (10 - 20), high (21-30) and very high (31-40) based on the impact level of disease on health status. A CAT score over 10 suggests significant symptoms.  A worsening CAT score could be explained by an exacerbation, poor medication adherence, poor inhaler technique, or progression of COPD or comorbid conditions.  CAT MCID is 2 points  mMRC: mMRC (Modified Medical Research Council) Dyspnea Scale is used to assess the degree of baseline functional disability in patients of respiratory disease due to  dyspnea. No minimal important difference is established. A decrease in score of 1 point or greater is considered a positive change.   Pulmonary Function Assessment: Pulmonary Function Assessment - 12/28/19 1410      Breath   Shortness of Breath  Yes;Limiting activity       Exercise Target Goals: Exercise Program Goal: Individual exercise prescription set using results from initial 6 min walk test and THRR while considering  patient's activity barriers and safety.   Exercise Prescription Goal: Initial exercise prescription builds to 30-45 minutes a day of aerobic activity, 2-3 days per week.  Home exercise guidelines will be given to patient during program as part of exercise prescription that the participant will acknowledge.  Education: Aerobic Exercise & Resistance Training: - Gives group verbal and written instruction on the various components of exercise. Focuses on aerobic and resistive training programs and the benefits of this training and how to safely progress through these programs..   Cardiac Rehab from 08/26/2019 in [facility] Cardiac and Pulmonary Rehab  Date  08/26/19  Educator  As  Instruction Review Code  1- Verbalizes Understanding      Education: Exercise & Equipment Safety: - Individual verbal instruction and demonstration of equipment use and safety with use of the equipment.   Pulmonary Rehab from 12/28/2019 in [facility] Cardiac and Pulmonary Rehab  Date  12/28/19  Educator  [facility]  Instruction Review Code  1- Verbalizes Understanding      Education: Exercise Physiology & General Exercise Guidelines: - Group verbal and written instruction with models to review the exercise physiology of the cardiovascular system and associated critical values. Provides general exercise guidelines with specific guidelines to those with heart or lung disease.    Education: Flexibility, Balance, Mind/Body Relaxation: Provides group verbal/written instruction on the benefits of flexibility and  balance training, including mind/body exercise modes such as yoga, pilates and tai chi.  Demonstration and skill practice provided.   Activity Barriers & Risk Stratification: Activity Barriers & Cardiac Risk Stratification - 01/04/20 1227      Activity Barriers & Cardiac Risk Stratification   Activity Barriers  Muscular Weakness;Deconditioning;Shortness of Breath       6 Minute Walk: 6 Minute Walk    Row Name 01/04/20 1225  6 Minute Walk   Phase  Initial     Distance  1635 feet     Walk Time  6 minutes     # of Rest Breaks  0     MPH  2.95     METS  3.2     RPE  11     Perceived Dyspnea   0     VO2 Peak  11.2     Symptoms  No     Resting HR  69 bpm     Resting BP  128/70     Resting Oxygen Saturation   97 %     Exercise Oxygen Saturation  during 6 min walk  97 %     Max Ex. HR  92 bpm     Max Ex. BP  134/70     2 Minute Post BP  124/70       Interval HR   1 Minute HR  79     2 Minute HR  86     3 Minute HR  82     4 Minute HR  89     5 Minute HR  91     6 Minute HR  92     2 Minute Post HR  68     Interval Heart Rate?  Yes       Interval Oxygen   Interval Oxygen?  Yes     Baseline Oxygen Saturation %  97 %     1 Minute Oxygen Saturation %  97 %     1 Minute Liters of Oxygen  0 L Room Air     2 Minute Oxygen Saturation %  95 %     2 Minute Liters of Oxygen  0 L     3 Minute Oxygen Saturation %  97 %     3 Minute Liters of Oxygen  0 L     4 Minute Oxygen Saturation %  97 %     4 Minute Liters of Oxygen  0 L     5 Minute Oxygen Saturation %  97 %     5 Minute Liters of Oxygen  0 L     6 Minute Oxygen Saturation %  97 %     6 Minute Liters of Oxygen  0 L     2 Minute Post Oxygen Saturation %  98 %     2 Minute Post Liters of Oxygen  0 L       Oxygen Initial Assessment: Oxygen Initial Assessment - 12/28/19 1409      Home Oxygen   Home Oxygen Device  None    Sleep Oxygen Prescription  None    Home Exercise Oxygen Prescription  None    Home at  Rest Exercise Oxygen Prescription  None      Initial 6 min Walk   Oxygen Used  None      Program Oxygen Prescription   Program Oxygen Prescription  None      Intervention   Short Term Goals  To learn and understand importance of monitoring SPO2 with pulse oximeter and demonstrate accurate use of the pulse oximeter.;To learn and understand importance of maintaining oxygen saturations>88%;To learn and demonstrate proper pursed lip breathing techniques or other breathing techniques.    Long  Term Goals  Verbalizes importance of monitoring SPO2 with pulse oximeter and return demonstration;Maintenance of O2 saturations>88%;Compliance with respiratory medication;Exhibits proper breathing techniques, such as pursed lip breathing  or other method taught during program session       Oxygen Re-Evaluation: Oxygen Re-Evaluation    Row Name 01/11/20 0857 01/18/20 0821           Program Oxygen Prescription   Program Oxygen Prescription  None  None        Home Oxygen   Home Oxygen Device  None  None      Sleep Oxygen Prescription  None  None      Home Exercise Oxygen Prescription  None  None      Home at Rest Exercise Oxygen Prescription  None  None      Compliance with Home Oxygen Use  Yes  Yes        Goals/Expected Outcomes   Short Term Goals  To learn and demonstrate proper pursed lip breathing techniques or other breathing techniques.  To learn and demonstrate proper pursed lip breathing techniques or other breathing techniques.      Long  Term Goals  Exhibits proper breathing techniques, such as pursed lip breathing or other method taught during program session  Exhibits proper breathing techniques, such as pursed lip breathing or other method taught during program session      Comments  Reviewed PLB technique with pt.  Talked about how it works and it's importance in maintaining their exercise saturations.  [place]a stated that she is using PLB and her shortness of breath does not limit her  daily activities very much. She used to get SOB going up stairs but has noticed an improvement  in this.      Goals/Expected Outcomes  Short: Become more profiecient at using PLB.   Long: Become independent at using PLB.  Short: Continue consistent exercise routine and PLB. Long: Become independent with exercise routine and PLB.         Oxygen Discharge (Final Oxygen Re-Evaluation): Oxygen Re-Evaluation - 01/18/20 0821      Program Oxygen Prescription   Program Oxygen Prescription  None      Home Oxygen   Home Oxygen Device  None    Sleep Oxygen Prescription  None    Home Exercise Oxygen Prescription  None    Home at Rest Exercise Oxygen Prescription  None    Compliance with Home Oxygen Use  Yes      Goals/Expected Outcomes   Short Term Goals  To learn and demonstrate proper pursed lip breathing techniques or other breathing techniques.    Long  Term Goals  Exhibits proper breathing techniques, such as pursed lip breathing or other method taught during program session    Comments  Bradyn stated that she is using PLB and her shortness of breath does not limit her daily activities very much. She used to get SOB going up stairs but has noticed an improvement  in this.    Goals/Expected Outcomes  Short: Continue consistent exercise routine and PLB. Long: Become independent with exercise routine and PLB.       Initial Exercise Prescription: Initial Exercise Prescription - 01/04/20 1200      Date of Initial Exercise RX and Referring Provider   Date  01/04/20    Referring Provider  Quay Burow MD      Treadmill   MPH  2.5    Grade  0.5    Minutes  15    METs  3.09      Recumbant Bike   Level  3    RPM  50    Watts  32  Minutes  15    METs  3      Arm Ergometer   Level  2    Watts  42    RPM  25    Minutes  15    METs  3      Elliptical   Level  1    Speed  3    Minutes  15    METs  3      Prescription Details   Frequency (times per week)  3    Duration   Progress to 30 minutes of continuous aerobic without signs/symptoms of physical distress      Intensity   THRR 40-80% of Max Heartrate  102-135    Ratings of Perceived Exertion  11-13    Perceived Dyspnea  0-4      Progression   Progression  Continue to progress workloads to maintain intensity without signs/symptoms of physical distress.      Resistance Training   Training Prescription  Yes    Weight  3 lb    Reps  10-15       Perform Capillary Blood Glucose checks as needed.  Exercise Prescription Changes: Exercise Prescription Changes    Row Name 01/04/20 1200 01/20/20 1000 02/05/20 0800         Response to Exercise   Blood Pressure (Admit)  128/70  110/60  104/58     Blood Pressure (Exercise)  134/70  100/58  144/74     Blood Pressure (Exit)  112/64  136/64  122/72     Heart Rate (Admit)  69 bpm  61 bpm  57 bpm     Heart Rate (Exercise)  92 bpm  111 bpm  99 bpm     Heart Rate (Exit)  65 bpm  69 bpm  67 bpm     Oxygen Saturation (Admit)  97 %  97 %  99 %     Oxygen Saturation (Exercise)  95 %  92 %  93 %     Oxygen Saturation (Exit)  98 %  97 %  99 %     Rating of Perceived Exertion (Exercise)  11  15  12      Perceived Dyspnea (Exercise)  0  1  1     Symptoms  none  none  none     Comments  walk test results  hard time on elliptical  -     Duration  -  Continue with 30 min of aerobic exercise without signs/symptoms of physical distress.  Continue with 30 min of aerobic exercise without signs/symptoms of physical distress.     Intensity  -  THRR unchanged  THRR unchanged       Progression   Progression  -  Continue to progress workloads to maintain intensity without signs/symptoms of physical distress.  Continue to progress workloads to maintain intensity without signs/symptoms of physical distress.     Average METs  -  3  3       Resistance Training   Training Prescription  -  Yes  Yes     Weight  -  3 lb  3 lb     Reps  -  10-15  10-15       Interval Training    Interval Training  -  No  No       Treadmill   MPH  -  3  -     Grade  -  0.5  -  Minutes  -  15  -     METs  -  3.5  -       Recumbant Bike   Level  -  3  3     RPM  -  -  50     Watts  -  -  22     Minutes  -  15  15     METs  -  -  3.16       Arm Ergometer   Level  -  2  2     RPM  -  -  25     Minutes  -  15  15     METs  -  2.5  -        Exercise Comments: Exercise Comments    Row Name 01/11/20 0855           Exercise Comments  First full day of exercise!  Patient was oriented to gym and equipment including functions, settings, policies, and procedures.  Patient's individual exercise prescription and treatment plan were reviewed.  All starting workloads were established based on the results of the 6 minute walk test done at initial orientation visit.  The plan for exercise progression was also introduced and progression will be customized based on patient's performance and goals.          Exercise Goals and Review: Exercise Goals    Row Name 01/04/20 1230             Exercise Goals   Increase Physical Activity  Yes       Intervention  Provide advice, education, support and counseling about physical activity/exercise needs.;Develop an individualized exercise prescription for aerobic and resistive training based on initial evaluation findings, risk stratification, comorbidities and participant's personal goals.       Expected Outcomes  Short Term: Attend rehab on a regular basis to increase amount of physical activity.;Long Term: Add in home exercise to make exercise part of routine and to increase amount of physical activity.;Long Term: Exercising regularly at least 3-5 days a week.       Increase Strength and Stamina  Yes       Intervention  Provide advice, education, support and counseling about physical activity/exercise needs.;Develop an individualized exercise prescription for aerobic and resistive training based on initial evaluation findings, risk  stratification, comorbidities and participant's personal goals.       Expected Outcomes  Short Term: Increase workloads from initial exercise prescription for resistance, speed, and METs.;Short Term: Perform resistance training exercises routinely during rehab and add in resistance training at home;Long Term: Improve cardiorespiratory fitness, muscular endurance and strength as measured by increased METs and functional capacity (6MWT)       Able to understand and use rate of perceived exertion (RPE) scale  Yes       Intervention  Provide education and explanation on how to use RPE scale       Expected Outcomes  Short Term: Able to use RPE daily in rehab to express subjective intensity level;Long Term:  Able to use RPE to guide intensity level when exercising independently       Able to understand and use Dyspnea scale  Yes       Intervention  Provide education and explanation on how to use Dyspnea scale       Expected Outcomes  Short Term: Able to use Dyspnea scale daily in rehab to express subjective sense of shortness of  breath during exertion;Long Term: Able to use Dyspnea scale to guide intensity level when exercising independently       Knowledge and understanding of Target Heart Rate Range (THRR)  Yes       Intervention  Provide education and explanation of THRR including how the numbers were predicted and where they are located for reference       Expected Outcomes  Short Term: Able to state/look up THRR;Short Term: Able to use daily as guideline for intensity in rehab;Long Term: Able to use THRR to govern intensity when exercising independently       Able to check pulse independently  Yes       Intervention  Provide education and demonstration on how to check pulse in carotid and radial arteries.;Review the importance of being able to check your own pulse for safety during independent exercise       Expected Outcomes  Short Term: Able to explain why pulse checking is important during independent  exercise;Long Term: Able to check pulse independently and accurately       Understanding of Exercise Prescription  Yes       Intervention  Provide education, explanation, and written materials on patient's individual exercise prescription       Expected Outcomes  Short Term: Able to explain program exercise prescription;Long Term: Able to explain home exercise prescription to exercise independently          Exercise Goals Re-Evaluation : Exercise Goals Re-Evaluation    Row Name 01/11/20 0855 01/18/20 0829 02/05/20 0854         Exercise Goal Re-Evaluation   Exercise Goals Review  Able to understand and use rate of perceived exertion (RPE) scale;Able to understand and use Dyspnea scale;Knowledge and understanding of Target Heart Rate Range (THRR);Understanding of Exercise Prescription  Able to understand and use rate of perceived exertion (RPE) scale;Able to understand and use Dyspnea scale;Knowledge and understanding of Target Heart Rate Range (THRR);Understanding of Exercise Prescription  Increase Physical Activity;Increase Strength and Stamina;Able to understand and use rate of perceived exertion (RPE) scale;Able to understand and use Dyspnea scale;Knowledge and understanding of Target Heart Rate Range (THRR);Able to check pulse independently;Understanding of Exercise Prescription     Comments  Reviewed RPE scale, THR and program prescription with pt today.  Pt voiced understanding and was given a copy of goals to take home. Short: Use RPE  daily to regulate intensity. Long: Follow program prescription in THR.  Ivette stated that she feels she is doing well with her exercise except for the eliptical. It is difficult for her exercising on the eliptical with her mask. He was given a breathable mask to try to see if that helps with breathing while exercising. She continues to progress with intensity and get stronger.  Wilmetta is attending consistently.  her 02 stays in high 90s with exercise.  Staff  will monitor progress.     Expected Outcomes  Short: Use RPE daily to regulate intensity. Long: Follow program prescription in THR.  Short: Try breathable mask with exercise to see if it improves SOB. Long: Follow program prescription in THR.  Short: continue to exercise consistently  Long:  continue to build stamina        Discharge Exercise Prescription (Final Exercise Prescription Changes): Exercise Prescription Changes - 02/05/20 0800      Response to Exercise   Blood Pressure (Admit)  104/58    Blood Pressure (Exercise)  144/74    Blood Pressure (Exit)  122/72  Heart Rate (Admit)  57 bpm    Heart Rate (Exercise)  99 bpm    Heart Rate (Exit)  67 bpm    Oxygen Saturation (Admit)  99 %    Oxygen Saturation (Exercise)  93 %    Oxygen Saturation (Exit)  99 %    Rating of Perceived Exertion (Exercise)  12    Perceived Dyspnea (Exercise)  1    Symptoms  none    Duration  Continue with 30 min of aerobic exercise without signs/symptoms of physical distress.    Intensity  THRR unchanged      Progression   Progression  Continue to progress workloads to maintain intensity without signs/symptoms of physical distress.    Average METs  3      Resistance Training   Training Prescription  Yes    Weight  3 lb    Reps  10-15      Interval Training   Interval Training  No      Recumbant Bike   Level  3    RPM  50    Watts  22    Minutes  15    METs  3.16      Arm Ergometer   Level  2    RPM  25    Minutes  15       Nutrition:  Target Goals: Understanding of nutrition guidelines, daily intake of sodium <1519m, cholesterol <2080m calories 30% from fat and 7% or less from saturated fats, daily to have 5 or more servings of fruits and vegetables.  Education: Controlling Sodium/Reading Food Labels -Group verbal and written material supporting the discussion of sodium use in heart healthy nutrition. Review and explanation with models, verbal and written materials for utilization  of the food label.   Education: General Nutrition Guidelines/Fats and Fiber: -Group instruction provided by verbal, written material, models and posters to present the general guidelines for heart healthy nutrition. Gives an explanation and review of dietary fats and fiber.   Biometrics: Pre Biometrics - 01/04/20 1231      Pre Biometrics   Height  5' 6.75" (1.695 m)    Weight  190 lb 3.2 oz (86.3 kg)    BMI (Calculated)  30.03    Single Leg Stand  6.62 seconds        Nutrition Therapy Plan and Nutrition Goals: Nutrition Therapy & Goals - 02/01/20 0901      Nutrition Therapy   Diet  HH, low Na diet    Drug/Food Interactions  --    Protein (specify units)  65g    Fiber  25 grams    Whole Grain Foods  3 servings    Saturated Fats  12 max. grams    Fruits and Vegetables  5 servings/day    Sodium  1.5 grams      Personal Nutrition Goals   Nutrition Goal  ST: 3-day food log LT: maintain wt, increase energy further, manage healthy    Comments  Pt reports choosing small portions, but still eating HH diet and maintaining changes from last time at rehab. Pt is still eating one big meal a day, reviewed that to meet all of her needs is hard in one meal so encouraged pt to follow hunger cues for portion sizes, make sure having at least one good source of protein, and keeping a variety. Reviewed HH eating. Pt with no questions at this time. Will do 3-day food log to ensure meeting needs.  Intervention Plan   Intervention  Prescribe, educate and counsel regarding individualized specific dietary modifications aiming towards targeted core components such as weight, hypertension, lipid management, diabetes, heart failure and other comorbidities.;Nutrition handout(s) given to patient.    Expected Outcomes  Short Term Goal: Understand basic principles of dietary content, such as calories, fat, sodium, cholesterol and nutrients.;Short Term Goal: A plan has been developed with personal nutrition  goals set during dietitian appointment.;Long Term Goal: Adherence to prescribed nutrition plan.       Nutrition Assessments: Nutrition Assessments - 01/04/20 1231      MEDFICTS Scores   Pre Score  18       MEDIFICTS Score Key:          ?70 Need to make dietary changes          40-70 Heart Healthy Diet         ? 40 Therapeutic Level Cholesterol Diet  Nutrition Goals Re-Evaluation:   Nutrition Goals Discharge (Final Nutrition Goals Re-Evaluation):   Psychosocial: Target Goals: Acknowledge presence or absence of significant depression and/or stress, maximize coping skills, provide positive support system. Participant is able to verbalize types and ability to use techniques and skills needed for reducing stress and depression.   Education: Depression - Provides group verbal and written instruction on the correlation between heart/lung disease and depressed mood, treatment options, and the stigmas associated with seeking treatment.   Education: Sleep Hygiene -Provides group verbal and written instruction about how sleep can affect your health.  Define sleep hygiene, discuss sleep cycles and impact of sleep habits. Review good sleep hygiene tips.    Education: Stress and Anxiety: - Provides group verbal and written instruction about the health risks of elevated stress and causes of high stress.  Discuss the correlation between heart/lung disease and anxiety and treatment options. Review healthy ways to manage with stress and anxiety.   Initial Review & Psychosocial Screening: Initial Psych Review & Screening - 12/28/19 1411      Initial Review   Current issues with  Current Sleep Concerns    Comments  Roda states that she has never had very good sleep.      Family Dynamics   Good Support System?  Yes    Comments  She can talk to her husband and friedns for support      Barriers   Psychosocial barriers to participate in program  The patient should benefit from training  in stress management and relaxation.      Screening Interventions   Interventions  Encouraged to exercise;Provide feedback about the scores to participant;To provide support and resources with identified psychosocial needs    Expected Outcomes  Short Term goal: Utilizing psychosocial counselor, staff and physician to assist with identification of specific Stressors or current issues interfering with healing process. Setting desired goal for each stressor or current issue identified.;Long Term Goal: Stressors or current issues are controlled or eliminated.;Short Term goal: Identification and review with participant of any Quality of Life or Depression concerns found by scoring the questionnaire.;Long Term goal: The participant improves quality of Life and PHQ9 Scores as seen by post scores and/or verbalization of changes       Quality of Life Scores:  Scores of 19 and below usually indicate a poorer quality of life in these areas.  A difference of  2-3 points is a clinically meaningful difference.  A difference of 2-3 points in the total score of the Quality of Life Index has been associated with  significant improvement in overall quality of life, self-image, physical symptoms, and general health in studies assessing change in quality of life.  PHQ-9: Recent Review Flowsheet Data    Depression screen [facility] 2/9 01/04/2020 05/18/2019 12/31/2018 10/03/2017 09/16/2017   Decreased Interest 0 1 0 0 0   Down, Depressed, Hopeless 0 0 0 0 0   PHQ - 2 Score 0 1 0 0 0   Altered sleeping 3 0 - 3 -   Tired, decreased energy 1 1 - 1 -   Change in appetite 0 0 - 0 -   Feeling bad or failure about yourself  0 0 - 0 -   Trouble concentrating 0 0 - 0 -   Moving slowly or fidgety/restless 0 0 - 0 -   Suicidal thoughts 0 0 - 0 -   PHQ-9 Score 4 2 - 4 -   Difficult doing work/chores Not difficult at all Not difficult at all - Not difficult at all -     Interpretation of Total Score  Total Score Depression  Severity:  1-4 = Minimal depression, 5-9 = Mild depression, 10-14 = Moderate depression, 15-19 = Moderately severe depression, 20-27 = Severe depression   Psychosocial Evaluation and Intervention: Psychosocial Evaluation - 12/28/19 1414      Psychosocial Evaluation & Interventions   Interventions  Encouraged to exercise with the program and follow exercise prescription    Comments  She has a positive outlook on the program and whats she needs to do to lose weight and be less short of breath/    Expected Outcomes  Short: Attend [facility] stress management education to decrease stress. Long: Maintain exercise [facility] to keep stress at a minimum.    Continue Psychosocial Services   Follow up required by staff       Psychosocial Re-Evaluation: Psychosocial Re-Evaluation    [place] Name 01/18/20 0815             Psychosocial Re-Evaluation   Current issues with  None Identified       Comments  Jolinda reports no new stress concerns and reports having a strong support system.       Expected Outcomes  Short: continue healhty sleep patterns Long: maintain good mental health and stress management.       Interventions  Encouraged to attend Cardiac Rehabilitation for the exercise       Continue Psychosocial Services   Follow up required by staff          Psychosocial Discharge (Final Psychosocial Re-Evaluation): Psychosocial Re-Evaluation - 01/18/20 0815      Psychosocial Re-Evaluation   Current issues with  None Identified    Comments  Allysson reports no new stress concerns and reports having a strong support system.    Expected Outcomes  Short: continue healhty sleep patterns Long: maintain good mental health and stress management.    Interventions  Encouraged to attend Cardiac Rehabilitation for the exercise    Continue Psychosocial Services   Follow up required by staff       Education: Education Goals: Education classes will be provided on a weekly basis, covering required  topics. Participant will state understanding/return demonstration of topics presented.  Learning Barriers/Preferences: Learning Barriers/Preferences - 12/28/19 1412      Learning Barriers/Preferences   Learning Barriers  None    Learning Preferences  None       General Pulmonary Education Topics:  Infection Prevention: - Provides verbal and written material to individual with discussion of infection  control including proper hand washing and proper equipment cleaning during exercise session.   Pulmonary Rehab from 12/28/2019 in [facility] Cardiac and Pulmonary Rehab  Date  12/28/19  Educator  [facility]  Instruction Review Code  1- Verbalizes Understanding      Falls Prevention: - Provides verbal and written material to individual with discussion of falls prevention and safety.   Pulmonary Rehab from 12/28/2019 in [facility] Cardiac and Pulmonary Rehab  Date  12/28/19  Educator  [facility]  Instruction Review Code  1- Verbalizes Understanding      Chronic Lung Diseases: - Group verbal and written instruction to review updates, respiratory medications, advancements in procedures and treatments. Discuss use of supplemental oxygen including available portable oxygen systems, continuous and intermittent flow rates, concentrators, personal use and safety guidelines. Review proper use of inhaler and spacers. Provide informative websites for self-education.    Energy Conservation: - Provide group verbal and written instruction for methods to conserve energy, plan and organize activities. Instruct on pacing techniques, use of adaptive equipment and posture/positioning to relieve shortness of breath.   Triggers and Exacerbations: - Group verbal and written instruction to review types of environmental triggers and ways to prevent exacerbations. Discuss weather changes, air quality and the benefits of nasal washing. Review warning signs and symptoms to help prevent infections. Discuss techniques for effective airway  clearance, coughing, and vibrations.   AED/CPR: - Group verbal and written instruction with the use of models to demonstrate the basic use of the AED with the basic ABC's of resuscitation.   Anatomy and Physiology of the Lungs: - Group verbal and written instruction with the use of models to provide basic lung anatomy and physiology related to function, structure and complications of lung disease.   Anatomy & Physiology of the Heart: - Group verbal and written instruction and models provide basic cardiac anatomy and physiology, with the coronary electrical and arterial systems. Review of Valvular disease and Heart Failure   Cardiac Medications: - Group verbal and written instruction to review commonly prescribed medications for heart disease. Reviews the medication, class of the drug, and side effects.   Other: -Provides group and verbal instruction on various topics (see comments)   Knowledge Questionnaire Score: Knowledge Questionnaire Score - 01/04/20 1232      Knowledge Questionnaire Score   Pre Score  15/18 Education Focus: COPD,        Core Components/Risk Factors/Patient Goals at Admission: Personal Goals and Risk Factors at Admission - 01/04/20 1250      Core Components/Risk Factors/Patient Goals on Admission    Weight Management  Yes;Weight Loss    Intervention  Weight Management: Develop a combined nutrition and exercise program designed to reach desired caloric intake, while maintaining appropriate intake of nutrient and fiber, sodium and fats, and appropriate energy expenditure required for the weight goal.;Weight Management: Provide education and appropriate resources to help participant work on and attain dietary goals.    Admit Weight  190 lb 3.2 oz (86.3 kg)    Goal Weight: Short Term  185 lb (83.9 kg)    Goal Weight: Long Term  180 lb (81.6 kg)    Expected Outcomes  Short Term: Continue to assess and modify interventions until short term weight is  achieved;Long Term: Adherence to nutrition and physical activity/exercise program aimed toward attainment of established weight goal;Weight Loss: Understanding of general recommendations for a balanced deficit meal plan, which promotes 1-2 lb weight loss per week and includes a negative energy balance of [phone] kcal/d;Understanding  recommendations for meals to include 15-35% energy as protein, 25-35% energy from fat, 35-60% energy from carbohydrates, less than 286m of dietary cholesterol, 20-35 gm of total fiber daily;Understanding of distribution of calorie intake throughout the day with the consumption of 4-5 meals/snacks    Improve shortness of breath with ADL's  Yes    Intervention  Provide education, individualized exercise plan and daily activity instruction to help decrease symptoms of SOB with activities of daily living.    Expected Outcomes  Long Term: Be able to perform more ADLs without symptoms or delay the onset of symptoms;Short Term: Improve cardiorespiratory fitness to achieve a reduction of symptoms when performing ADLs    Lipids  Yes    Intervention  Provide education and support for participant on nutrition & aerobic/resistive exercise along with prescribed medications to achieve LDL <760m HDL >4050m   Expected Outcomes  Short Term: Participant states understanding of desired cholesterol values and is compliant with medications prescribed. Participant is following exercise prescription and nutrition guidelines.;Long Term: Cholesterol controlled with medications as prescribed, with individualized exercise RX and with personalized nutrition plan. Value goals: LDL < 55m79mDL > 40 mg.       Education:Diabetes - Individual verbal and written instruction to review signs/symptoms of diabetes, desired ranges of glucose level fasting, after meals and with exercise. Acknowledge that pre and post exercise glucose checks will be done for 3 sessions at entry of program.   Education: Know  Your Numbers and Risk Factors: -Group verbal and written instruction about important numbers in your health.  Discussion of what are risk factors and how they play a role in the disease process.  Review of Cholesterol, Blood Pressure, Diabetes, and BMI and the role they play in your overall health.   [facility]  Date  08/12/19  Educator  [facility]  Instruction Review Code  1- Verbalizes Understanding      Core Components/Risk Factors/Patient Goals Review:  Goals and Risk Factor Review    Row Name 01/18/20 0813             Core Components/Risk Factors/Patient Goals Review   Personal Goals Review  Weight Management/Obesity;Hypertension;Lipids       Review  MarlFatime been mainatining her weight with small ups and downs. She reports taking all medications as prescribed and maintaining her BP and lipids in acceptable ranges.       Expected Outcomes  Short: Continue to work on weight loss.  Long: Continue to monitor risk factors.          Core Components/Risk Factors/Patient Goals at Discharge (Final Review):  Goals and Risk Factor Review - 01/18/20 0813      Core Components/Risk Factors/Patient Goals Review   Personal Goals Review  Weight Management/Obesity;Hypertension;Lipids    Review  MarlMakiah been mainatining her weight with small ups and downs. She reports taking all medications as prescribed and maintaining her BP and lipids in acceptable ranges.    Expected Outcomes  Short: Continue to work on weight loss.  Long: Continue to monitor risk factors.       ITP Comments: ITP Comments    Row Name 12/28/19 1421 01/04/20 1224 01/11/20 0855 01/13/20 0642 02/01/20 0909   ITP Comments  Virtual Orientation performed. Patient informed when to come in for RD and EP orientation. Diagnosis can be found in [facility]  Completed 6MWT and gym orientation.  Initial ITP created and sent for review to Dr.  Emily Filbert, Medical Director.  First  full day of exercise!  Patient was oriented to gym and equipment including functions, settings, policies, and procedures.  Patient's individual exercise prescription and treatment plan were reviewed.  All starting workloads were established based on the results of the 6 minute walk test done at initial orientation visit.  The plan for exercise progression was also introduced and progression will be customized based on patient's performance and goals.  30 day chart review completed. ITP sent to Dr Zachery Dakins Medical Director, for review,changes as needed and signature. Continue with ITP if no changes requested New to program  Completed Initial RD Eval   Row Name 02/10/20 0606           ITP Comments  30 Day review completed. Medical Director review done, changes made as directed,and approval shown by signature of Market researcher.          Comments:

## 2020-02-11 ENCOUNTER — Other Ambulatory Visit: Payer: Self-pay

## 2020-02-11 ENCOUNTER — Encounter: Payer: Medicare PPO | Admitting: *Deleted

## 2020-02-11 DIAGNOSIS — I2510 Atherosclerotic heart disease of native coronary artery without angina pectoris: Secondary | ICD-10-CM | POA: Diagnosis not present

## 2020-02-11 DIAGNOSIS — Z923 Personal history of irradiation: Secondary | ICD-10-CM | POA: Diagnosis not present

## 2020-02-11 DIAGNOSIS — E785 Hyperlipidemia, unspecified: Secondary | ICD-10-CM | POA: Diagnosis not present

## 2020-02-11 DIAGNOSIS — K219 Gastro-esophageal reflux disease without esophagitis: Secondary | ICD-10-CM | POA: Diagnosis not present

## 2020-02-11 DIAGNOSIS — Z853 Personal history of malignant neoplasm of breast: Secondary | ICD-10-CM | POA: Diagnosis not present

## 2020-02-11 DIAGNOSIS — Z7982 Long term (current) use of aspirin: Secondary | ICD-10-CM | POA: Diagnosis not present

## 2020-02-11 DIAGNOSIS — R0600 Dyspnea, unspecified: Secondary | ICD-10-CM | POA: Diagnosis not present

## 2020-02-11 DIAGNOSIS — R0609 Other forms of dyspnea: Secondary | ICD-10-CM

## 2020-02-11 DIAGNOSIS — Z79899 Other long term (current) drug therapy: Secondary | ICD-10-CM | POA: Diagnosis not present

## 2020-02-11 NOTE — Progress Notes (Signed)
Daily Session Note  Patient Details  Name: Jessica [name] MRN: [id number] Date of Birth: 1951-10-04 Referring Provider:     Pulmonary Rehab from 01/04/2020 in [facility] Cardiac and Pulmonary Rehab  Referring Provider  Quay Burow MD      Encounter Date: 02/11/2020  Check In: Session Check In - 02/11/20 0818      Check-In   Supervising physician immediately available to respond to emergencies  See telemetry face sheet for immediately available ER MD    Location  [facility]-Cardiac & Pulmonary Rehab    Staff Present  Heath Lark, RN, BSN, CCRP;Melissa Sanborn RDN, Rowe Pavy, BA, ACSM CEP, Exercise Physiologist    Virtual Visit  No    Medication changes reported      No    Fall or balance concerns reported     No    Warm-up and Cool-down  Performed on first and last piece of equipment    Resistance Training Performed  Yes    VAD Patient?  No    PAD/SET Patient?  No      Pain Assessment   Currently in Pain?  No/denies          Social History   Tobacco Use  Smoking Status Never Smoker  Smokeless Tobacco Never Used    Goals Met:  Proper associated with RPD/PD & O2 Sat Independence with exercise equipment Exercise tolerated well No report of cardiac concerns or symptoms  Goals Unmet:  Not Applicable  Comments: Pt able to follow exercise prescription today without complaint.  Will continue to monitor for progression.  Updated home exercise with pt today.  Pt plans to walk at home for exercise.  Reviewed THR, pulse, RPE, sign and symptoms, NTG use, and when to call 911 or MD.  Also discussed weather considerations and indoor options.  Pt voiced understanding.   Dr. Emily Filbert is Medical Director for [facility] and [facility].

## 2020-02-16 ENCOUNTER — Other Ambulatory Visit: Payer: Self-pay

## 2020-02-16 ENCOUNTER — Encounter: Payer: Medicare PPO | Admitting: *Deleted

## 2020-02-16 DIAGNOSIS — E785 Hyperlipidemia, unspecified: Secondary | ICD-10-CM | POA: Diagnosis not present

## 2020-02-16 DIAGNOSIS — Z853 Personal history of malignant neoplasm of breast: Secondary | ICD-10-CM | POA: Diagnosis not present

## 2020-02-16 DIAGNOSIS — R0600 Dyspnea, unspecified: Secondary | ICD-10-CM | POA: Diagnosis not present

## 2020-02-16 DIAGNOSIS — R0609 Other forms of dyspnea: Secondary | ICD-10-CM

## 2020-02-16 DIAGNOSIS — K219 Gastro-esophageal reflux disease without esophagitis: Secondary | ICD-10-CM | POA: Diagnosis not present

## 2020-02-16 DIAGNOSIS — Z923 Personal history of irradiation: Secondary | ICD-10-CM | POA: Diagnosis not present

## 2020-02-16 DIAGNOSIS — Z79899 Other long term (current) drug therapy: Secondary | ICD-10-CM | POA: Diagnosis not present

## 2020-02-16 DIAGNOSIS — I2510 Atherosclerotic heart disease of native coronary artery without angina pectoris: Secondary | ICD-10-CM | POA: Diagnosis not present

## 2020-02-16 DIAGNOSIS — Z7982 Long term (current) use of aspirin: Secondary | ICD-10-CM | POA: Diagnosis not present

## 2020-02-16 NOTE — Progress Notes (Signed)
Daily Session Note  Patient Details  Name: Ahmyah Gidley MRN: [id number] Date of Birth: 03/28/1951 Referring Provider:     Pulmonary Rehab from 01/04/2020 in [facility] Cardiac and Pulmonary Rehab  Referring Provider  Quay Burow MD      Encounter Date: 02/16/2020  Check In: Session Check In - 02/16/20 0801      Check-In   Supervising physician immediately available to respond to emergencies  See telemetry face sheet for immediately available ER MD    Location  [facility]-Cardiac & Pulmonary Rehab    Staff Present  Heath Lark, RN, BSN, CCRP;Joseph Foy [place], [place], ACSM CEP, Exercise Physiologist    Virtual Visit  No    Medication changes reported      No    Fall or balance concerns reported     No    Warm-up and Cool-down  Performed on first and last piece of equipment    Resistance Training Performed  Yes    VAD Patient?  No    PAD/SET Patient?  No      Pain Assessment   Currently in Pain?  No/denies          Social History   Tobacco Use  Smoking Status Never Smoker  Smokeless Tobacco Never Used    Goals Met:  Proper associated with RPD/PD & O2 Sat Independence with exercise equipment Exercise tolerated well No report of cardiac concerns or symptoms  Goals Unmet:  Not Applicable  Comments: Pt able to follow exercise prescription today without complaint.  Will continue to monitor for progression.    Dr. Emily Filbert is Medical Director for [facility] and [facility].

## 2020-02-18 ENCOUNTER — Emergency Department: Payer: Medicare PPO

## 2020-02-18 ENCOUNTER — Emergency Department
Admission: EM | Admit: 2020-02-18 | Discharge: 2020-02-18 | Disposition: A | Discharge status: Home / Self Care | Payer: Medicare PPO | Attending: Emergency Medicine | Admitting: Emergency Medicine

## 2020-02-18 ENCOUNTER — Encounter: Payer: Self-pay | Admitting: Emergency Medicine

## 2020-02-18 ENCOUNTER — Other Ambulatory Visit: Payer: Self-pay

## 2020-02-18 DIAGNOSIS — Z853 Personal history of malignant neoplasm of breast: Secondary | ICD-10-CM | POA: Diagnosis not present

## 2020-02-18 DIAGNOSIS — Z7982 Long term (current) use of aspirin: Secondary | ICD-10-CM | POA: Insufficient documentation

## 2020-02-18 DIAGNOSIS — I2510 Atherosclerotic heart disease of native coronary artery without angina pectoris: Secondary | ICD-10-CM | POA: Insufficient documentation

## 2020-02-18 DIAGNOSIS — Z951 Presence of aortocoronary bypass graft: Secondary | ICD-10-CM | POA: Diagnosis not present

## 2020-02-18 DIAGNOSIS — R52 Pain, unspecified: Secondary | ICD-10-CM

## 2020-02-18 DIAGNOSIS — M79601 Pain in right arm: Secondary | ICD-10-CM

## 2020-02-18 DIAGNOSIS — Z79899 Other long term (current) drug therapy: Secondary | ICD-10-CM | POA: Diagnosis not present

## 2020-02-18 DIAGNOSIS — M79621 Pain in right upper arm: Secondary | ICD-10-CM | POA: Diagnosis not present

## 2020-02-18 MED ORDER — PREDNISONE 10 MG (21) PO TBPK: ORAL_TABLET | ORAL | 0 refills | Status: DC

## 2020-02-18 NOTE — ED Notes (Addendum)
See triage note  Presents with pain to right upper arm  States pain started yesterday  Pain increases with palpation and motion  Good pulses

## 2020-02-18 NOTE — ED Triage Notes (Addendum)
Patient ambulatory to triage with steady gait, without difficulty or distress noted, mask in place; pt reports since yesterday am upon awakening having rt upper arm pain with any ROM; denies shoulder pain, pain localized to upper arm and is tender to palpation; no redness noted but some swelling to arm visible; strong radial pulse, brisk cap refill, W&D, good movement/sensation to hand; denies any injury

## 2020-02-18 NOTE — Discharge Instructions (Signed)
Please follow up with primary care.  Return to the ER for symptoms that change or worsen if unable to schedule an appointment.

## 2020-02-18 NOTE — ED Provider Notes (Signed)
[facility] Emergency Department Provider Note ____________________________________________   None    (approximate)  I have reviewed the triage vital signs and the nursing notes.   HISTORY  Chief Complaint Arm Pain  HPI Jessica [name] is a 69 y.o. female presents to the ER for evaluation of pain in the right upper arm with any movement. No known injury. She has had similar symptoms a year or so ago and was evaluated at urgent care. They did an x-ray that was normal and then prescribed prednisone. Prednisone took the pain away. Current symptoms started upon awakening yesterday and has progressively worsened.       Past Medical History:  Diagnosis Date  . Allergy   . Breast cancer ([place]) 2000  . Cancer [facility])    breast  . Colon polyp   . Coronary artery disease   . GERD (gastroesophageal reflux disease)   . History of blood transfusion   . Hyperlipidemia   . Migraine    history of migrarines, none as an adult  . Personal history of radiation therapy 2000    Patient Active Problem List   Diagnosis Date Noted  . Post inflammatory hypopigmentation 12/30/2019  . Claudication in peripheral vascular disease ([place]) 12/22/2019  . Arthritis of carpometacarpal [facility]) joint of left thumb 10/28/2019  . Hand arthritis 10/01/2019  . Statin myopathy 08/30/2019  . Hyperlipidemia 04/08/2019  . S/P CABG x 5 01/05/2019  . Degenerative joint disease of low back 01/09/2017  . Routine general medical examination at a health care facility 10/02/2016  . GERD (gastroesophageal reflux disease) 07/28/2016  . H/O malignant neoplasm of breast 06/19/2016    Past Surgical History:  Procedure Laterality Date  . ABDOMINAL HYSTERECTOMY    . BREAST LUMPECTOMY Right 2000  . BREAST SURGERY Right    calcifaction removed  . BUNIONECTOMY Bilateral   . COLONOSCOPY W/ POLYPECTOMY    . CORONARY ARTERY BYPASS GRAFT N/A 01/05/2019   Procedure: CORONARY ARTERY BYPASS GRAFTING  (CABG), using right leg saphenous endoscopic and open vein harvest, exploration left leg;  Surgeon: Gaye Pollack, MD;  Location: [place] OR;  Service: Open Heart Surgery;  Laterality: N/A;  . EYE SURGERY Bilateral    cataract  . hemorrhoid    . LEFT HEART CATH AND CORONARY ANGIOGRAPHY N/A 01/01/2019   Procedure: LEFT HEART CATH AND CORONARY ANGIOGRAPHY;  Surgeon: Jettie Booze, MD;  Location: [place] CV LAB;  Service: Cardiovascular;  Laterality: N/A;  . STERNAL WIRES REMOVAL N/A 05/11/2019   Procedure: STERNAL WIRES REMOVAL;  Surgeon: Gaye Pollack, MD;  Location: [place];  Service: Thoracic;  Laterality: N/A;  . TEE WITHOUT CARDIOVERSION N/A 01/05/2019   Procedure: TRANSESOPHAGEAL ECHOCARDIOGRAM (TEE);  Surgeon: Gaye Pollack, MD;  Location: [place];  Service: Open Heart Surgery;  Laterality: N/A;    Prior to Admission medications   Medication Sig Start Date End Date Taking? Authorizing Provider  Ascorbic Acid (VITAMIN C) 1000 MG tablet Take 2,000 mg by mouth daily.    [provider]  aspirin EC 325 MG EC tablet Take 1 tablet (325 mg total) by mouth daily. Patient taking differently: Take 325 mg by mouth at bedtime.  01/10/19   Gold, Wilder Glade, PA-C  Cholecalciferol (VITAMIN D-3) 125 MCG (5000 UT) TABS Take 5,000 Units by mouth daily.    [provider]  Evolocumab (REPATHA SURECLICK) XX123456 MG/ML SOAJ Inject 140 mg into the skin every 14 (fourteen) days. 12/14/19   Quay Burow  J, MD  hydrocortisone (ANUSOL-HC) 2.5 % rectal cream Place 1 application rectally 2 (two) times daily. 09/21/19   Hoyt Koch, MD  loratadine (CLARITIN) 10 MG tablet Take 10 mg by mouth every evening.     [provider]  metoprolol tartrate (LOPRESSOR) 25 MG tablet Take 0.5 tablets (12.5 mg total) by mouth daily. 12/22/19   Lorretta Harp, MD  Multiple Vitamin (MULTIVITAMIN WITH MINERALS) TABS tablet Take 1 tablet by mouth daily.    [provider]  predniSONE  (STERAPRED UNI-PAK 21 TAB) 10 MG (21) TBPK tablet Take 6 tablets on the first day and decrease by 1 tablet each day until finished. 02/18/20   Tonnie Stillman, Johnette Abraham B, FNP    Allergies Codeine, Sulfa antibiotics, and Sulfacetamide  Family History  Problem Relation Age of Onset  . Heart disease Brother   . Asthma Paternal Aunt   . Diabetes Paternal Aunt   . Stroke Paternal Uncle   . Kidney disease Paternal Uncle     Social History Social History   Tobacco Use  . Smoking status: Never Smoker  . Smokeless tobacco: Never Used  Substance Use Topics  . Alcohol use: No  . Drug use: No    Review of Systems  Constitutional: No fever/chills Eyes: No visual changes. ENT: No sore throat. Cardiovascular: Denies chest pain. Respiratory: Denies shortness of breath. Gastrointestinal: No abdominal pain.  No nausea, no vomiting.  No diarrhea.  No constipation. Genitourinary: Negative for dysuria. Musculoskeletal: Positive for right upper arm pain. Skin: Negative for rash. Neurological: Negative for headaches, focal weakness or numbness  ____________________________________________   PHYSICAL EXAM:  VITAL SIGNS: Today's Vitals   02/18/20 0557 02/18/20 0713 02/18/20 0729 02/18/20 0748  BP:  118/65 118/68   Pulse:  62 80   Resp:  14 18   Temp:  98.2 F (36.8 C) 98 F (36.7 C)   TempSrc:  Oral Oral   SpO2:  97% 98%   Weight: 86.2 kg     Height: 5\' 6"  (1.676 m)     PainSc: 10-Worst pain ever   7    Body mass index is 30.67 kg/m.   Constitutional: Alert and oriented. Well appearing and in no acute distress. Eyes: Conjunctivae are normal. PERRL. EOMI. Head: Atraumatic. Nose: No congestion/rhinnorhea. Mouth/Throat: Mucous membranes are moist.  Oropharynx non-erythematous. Neck: No stridor.   Hematological/Lymphatic/Immunilogical: No cervical lymphadenopathy. Cardiovascular: Normal rate, regular rhythm. Grossly normal heart sounds.  Good peripheral circulation. Respiratory: Normal  respiratory effort.  No retractions. Lungs CTAB. Gastrointestinal: Soft and nontender. No distention. No abdominal bruits. No CVA tenderness. Genitourinary:  Musculoskeletal: Focal tenderness over the deltoid on the right arm.  Neurologic:  Normal speech and language. No gross focal neurologic deficits are appreciated. No gait instability. Skin:  Skin is warm, dry and intact. No rash noted. No contusions/abrasions/wounds Psychiatric: Mood and affect are normal. Speech and behavior are normal.  ____________________________________________   LABS (all labs ordered are listed, but only abnormal results are displayed)  Labs Reviewed - No data to display ____________________________________________  EKG  Not indicated. ____________________________________________  RADIOLOGY  ED MD interpretation:    US of the right upper extremity shows no acute findings.  Official radiology report(s): US Venous Img Upper Uni Right(DVT)  Result Date: 02/18/2020 CLINICAL DATA:  Right upper extremity pain since yesterday morning. History of breast cancer. Evaluate for DVT. EXAM: RIGHT UPPER EXTREMITY VENOUS DOPPLER ULTRASOUND TECHNIQUE: Gray-scale sonography with graded compression, as well as color Doppler and duplex ultrasound  were performed to evaluate the upper extremity deep venous system from the level of the subclavian vein and including the jugular, axillary, basilic, radial, ulnar and upper cephalic vein. Spectral Doppler was utilized to evaluate flow at rest and with distal augmentation maneuvers. COMPARISON:  None. FINDINGS: Contralateral Subclavian Vein: Respiratory phasicity is normal and symmetric with the symptomatic side. No evidence of thrombus. Normal compressibility. Internal Jugular Vein: No evidence of thrombus. Normal compressibility, respiratory phasicity and response to augmentation. Subclavian Vein: No evidence of thrombus. Normal compressibility, respiratory phasicity and response to  augmentation. Axillary Vein: No evidence of thrombus. Normal compressibility, respiratory phasicity and response to augmentation. Cephalic Vein: No evidence of thrombus. Normal compressibility, respiratory phasicity and response to augmentation. Basilic Vein: No evidence of thrombus. Normal compressibility, respiratory phasicity and response to augmentation. Brachial Veins: No evidence of thrombus. Normal compressibility, respiratory phasicity and response to augmentation. Radial Veins: No evidence of thrombus. Normal compressibility, respiratory phasicity and response to augmentation. Ulnar Veins: No evidence of thrombus. Normal compressibility, respiratory phasicity and response to augmentation. Venous Reflux:  None visualized. Other Findings:  None visualized. IMPRESSION: No evidence of DVT within the right upper extremity. Electronically Signed   By: Sandi Mariscal M.D.   On: 02/18/2020 07:13    ____________________________________________   PROCEDURES  Procedure(s) performed (including Critical Care):  Procedures  ____________________________________________   INITIAL IMPRESSION / ASSESSMENT AND PLAN     20-year-old female presenting to the emergency department for treatment and evaluation of acute onset of right arm pain.  See HPI for further details.  Plan will be to await the results of the ultrasound that was performed while awaiting ER room assignment.  DIFFERENTIAL DIAGNOSIS  DVT, phlebitis, musculoskeletal strain  ED COURSE  Ultrasound is negative for acute DVT or other findings of concern.  Patient will be treated with prednisone as this helped her symptoms couple years ago when she had the same type of pain.  She was advised to follow-up with her primary care provider if her symptoms change or worsen return to the emergency department. ____________________________________________   FINAL CLINICAL IMPRESSION(S) / ED DIAGNOSES  Final diagnoses:  Pain  Right arm pain      ED Discharge Orders         Ordered    predniSONE (STERAPRED UNI-PAK 21 TAB) 10 MG (21) TBPK tablet     02/18/20 0735           [name] was evaluated in Emergency Department on 02/18/2020 for the symptoms described in the history of present illness. She was evaluated in the context of the global COVID-19 pandemic, which necessitated consideration that the patient might be at risk for infection with the SARS-CoV-2 virus that causes COVID-19. Institutional protocols and algorithms that pertain to the evaluation of patients at risk for COVID-19 are in a state of rapid change based on information released by regulatory bodies including the CDC and federal and state organizations. These policies and algorithms were followed during the patient's care in the ED.   Note:  This document was prepared using Dragon voice recognition software and may include unintentional dictation errors.   Victorino Dike, FNP 02/18/20 [id number]    Nance Pear, MD 02/18/20 [phone]

## 2020-02-23 ENCOUNTER — Other Ambulatory Visit: Payer: Self-pay

## 2020-02-23 ENCOUNTER — Encounter: Payer: Medicare PPO | Attending: Cardiovascular Disease | Admitting: *Deleted

## 2020-02-23 DIAGNOSIS — Z923 Personal history of irradiation: Secondary | ICD-10-CM | POA: Diagnosis not present

## 2020-02-23 DIAGNOSIS — R0609 Other forms of dyspnea: Secondary | ICD-10-CM

## 2020-02-23 DIAGNOSIS — R0600 Dyspnea, unspecified: Secondary | ICD-10-CM | POA: Insufficient documentation

## 2020-02-23 DIAGNOSIS — Z853 Personal history of malignant neoplasm of breast: Secondary | ICD-10-CM | POA: Diagnosis not present

## 2020-02-23 DIAGNOSIS — K219 Gastro-esophageal reflux disease without esophagitis: Secondary | ICD-10-CM | POA: Diagnosis not present

## 2020-02-23 DIAGNOSIS — Z79899 Other long term (current) drug therapy: Secondary | ICD-10-CM | POA: Diagnosis not present

## 2020-02-23 DIAGNOSIS — E785 Hyperlipidemia, unspecified: Secondary | ICD-10-CM | POA: Insufficient documentation

## 2020-02-23 DIAGNOSIS — I2510 Atherosclerotic heart disease of native coronary artery without angina pectoris: Secondary | ICD-10-CM | POA: Diagnosis not present

## 2020-02-23 DIAGNOSIS — Z7982 Long term (current) use of aspirin: Secondary | ICD-10-CM | POA: Insufficient documentation

## 2020-02-23 NOTE — Progress Notes (Signed)
Daily Session Note  Patient Details  Name: Jessica [name] MRN: [id number] Date of Birth: 08/23/1951 Referring Provider:     Pulmonary Rehab from 01/04/2020 in [facility] Cardiac and Pulmonary Rehab  Referring Provider  Berry, Jonathan MD      Encounter Date: 02/23/2020  Check In: Session Check In - 02/23/20 0824      Check-In   Supervising physician immediately available to respond to emergencies  See telemetry face sheet for immediately available ER MD    Location  [facility]Cardiac & Pulmonary Rehab    Staff Present  Susanne Bice, RN, BSN, CCRP;Joseph Hood RCP,RRT,BSRT;Amanda Sommer, BA, ACSM CEP, Exercise Physiologist    Virtual Visit  No    Medication changes reported      No    Fall or balance concerns reported     No    Warm-up and Cool-down  Performed on first and last piece of equipment    Resistance Training Performed  Yes    VAD Patient?  No    PAD/SET Patient?  No      Pain Assessment   Currently in Pain?  No/denies          Social History   Tobacco Use  Smoking Status Never Smoker  Smokeless Tobacco Never Used    Goals Met:  Independence with exercise equipment Exercise tolerated well No report of cardiac concerns or symptoms  Goals Unmet:  Not Applicable  Comments: Pt able to follow exercise prescription today without complaint.  Will continue to monitor for progression.    Dr. Mark Miller is Medical Director for [facility] and [facility]. 

## 2020-02-25 ENCOUNTER — Other Ambulatory Visit: Payer: Self-pay

## 2020-02-25 ENCOUNTER — Encounter: Payer: Medicare PPO | Admitting: *Deleted

## 2020-02-25 DIAGNOSIS — Z7982 Long term (current) use of aspirin: Secondary | ICD-10-CM | POA: Diagnosis not present

## 2020-02-25 DIAGNOSIS — Z923 Personal history of irradiation: Secondary | ICD-10-CM | POA: Diagnosis not present

## 2020-02-25 DIAGNOSIS — R0609 Other forms of dyspnea: Secondary | ICD-10-CM

## 2020-02-25 DIAGNOSIS — Z853 Personal history of malignant neoplasm of breast: Secondary | ICD-10-CM | POA: Diagnosis not present

## 2020-02-25 DIAGNOSIS — K219 Gastro-esophageal reflux disease without esophagitis: Secondary | ICD-10-CM | POA: Diagnosis not present

## 2020-02-25 DIAGNOSIS — Z79899 Other long term (current) drug therapy: Secondary | ICD-10-CM | POA: Diagnosis not present

## 2020-02-25 DIAGNOSIS — E785 Hyperlipidemia, unspecified: Secondary | ICD-10-CM | POA: Diagnosis not present

## 2020-02-25 DIAGNOSIS — I2510 Atherosclerotic heart disease of native coronary artery without angina pectoris: Secondary | ICD-10-CM | POA: Diagnosis not present

## 2020-02-25 DIAGNOSIS — R0600 Dyspnea, unspecified: Secondary | ICD-10-CM | POA: Diagnosis not present

## 2020-02-25 NOTE — Progress Notes (Signed)
Daily Session Note  Patient Details  Name: Jessica [name] MRN: [id number] Date of Birth: Dec 08, 1950 Referring Provider:     Pulmonary Rehab from 01/04/2020 in [facility] Card[facility] and Pulmonary Rehab  Referring Provider  Quay Burow MD      Encounter Date: 02/25/2020  Check In: Session Check In - 02/25/20 5462      Check-In   Supervising physician immediately available to respond to emergencies  See telemetry face sheet for immediately available ER MD    Location  [facility]    Staff Present  Heath Lark, RN, BSN, CCRP;Melissa Pioneer RDN, Rowe Pavy, BA, ACSM CEP, Exercise Physiologist    Virtual Visit  No    Medication changes reported      No    Fall or balance concerns reported     No    Warm-up and Cool-down  Performed on first and last piece of equipment    Resistance Training Performed  Yes    VAD Patient?  No    PAD/SET Patient?  No      Pain Assessment   Currently in Pain?  No/denies          Social History   Tobacco Use  Smoking Status Never Smoker  Smokeless Tobacco Never Used    Goals Met:  Proper associated with RPD/PD & O2 Sat Independence with exercise equipment Exercise tolerated well No report of cardiac concerns or symptoms  Goals Unmet:  Not Applicable  Comments: Pt able to follow exercise prescription today without complaint.  Will continue to monitor for progression.    Dr. Emily Filbert is Medical Director for [place] and [facility].

## 2020-03-01 ENCOUNTER — Encounter: Payer: Medicare PPO | Admitting: *Deleted

## 2020-03-01 ENCOUNTER — Other Ambulatory Visit: Payer: Self-pay

## 2020-03-01 DIAGNOSIS — I2510 Atherosclerotic heart disease of native coronary artery without angina pectoris: Secondary | ICD-10-CM | POA: Diagnosis not present

## 2020-03-01 DIAGNOSIS — Z853 Personal history of malignant neoplasm of breast: Secondary | ICD-10-CM | POA: Diagnosis not present

## 2020-03-01 DIAGNOSIS — Z7982 Long term (current) use of aspirin: Secondary | ICD-10-CM | POA: Diagnosis not present

## 2020-03-01 DIAGNOSIS — Z79899 Other long term (current) drug therapy: Secondary | ICD-10-CM | POA: Diagnosis not present

## 2020-03-01 DIAGNOSIS — E785 Hyperlipidemia, unspecified: Secondary | ICD-10-CM | POA: Diagnosis not present

## 2020-03-01 DIAGNOSIS — R0600 Dyspnea, unspecified: Secondary | ICD-10-CM | POA: Diagnosis not present

## 2020-03-01 DIAGNOSIS — R0609 Other forms of dyspnea: Secondary | ICD-10-CM

## 2020-03-01 DIAGNOSIS — K219 Gastro-esophageal reflux disease without esophagitis: Secondary | ICD-10-CM | POA: Diagnosis not present

## 2020-03-01 DIAGNOSIS — Z923 Personal history of irradiation: Secondary | ICD-10-CM | POA: Diagnosis not present

## 2020-03-01 NOTE — Progress Notes (Signed)
Daily Session Note  Patient Details  Name: Jessica [name] MRN: [id number] Date of Birth: 1951-06-15 Referring Provider:     Pulmonary Rehab from 01/04/2020 in [facility] Re[facility]  Referring Provider  Quay Burow MD      Encounter Date: 03/01/2020  Check In: Session Check In - 03/01/20 0816      Check-In   Supervising physician immediately available to respond to emergencies  See telemetry face sheet for immediately available ER MD    Location  [facility]    Staff Present  Heath Lark, RN, BSN, Lance Sell, BA, ACSM CEP, Exercise Physiologist;Joseph Hood RCP,RRT,BSRT    Virtual Visit  No    Medication changes reported      No    Fall or balance concerns reported     No    Warm-up and Cool-down  Performed on first and last piece of equipment    Resistance Training Performed  Yes    VAD Patient?  No    PAD/SET Patient?  No      Pain Assessment   Currently in Pain?  No/denies          Social History   Tobacco Use  Smoking Status Never Smoker  Smokeless Tobacco Never Used    Goals Met:  Independence with exercise equipment Exercise tolerated well No report of cardiac concerns or symptoms  Goals Unmet:  Not Applicable  Comments: Pt able to follow exercise prescription today without complaint.  Will continue to monitor for progression.    Dr. Emily Filbert is Medical Director for [place] and [facility].

## 2020-03-03 ENCOUNTER — Other Ambulatory Visit: Payer: Self-pay

## 2020-03-03 ENCOUNTER — Encounter: Payer: Medicare PPO | Admitting: *Deleted

## 2020-03-03 DIAGNOSIS — Z853 Personal history of malignant neoplasm of breast: Secondary | ICD-10-CM | POA: Diagnosis not present

## 2020-03-03 DIAGNOSIS — Z7982 Long term (current) use of aspirin: Secondary | ICD-10-CM | POA: Diagnosis not present

## 2020-03-03 DIAGNOSIS — K219 Gastro-esophageal reflux disease without esophagitis: Secondary | ICD-10-CM | POA: Diagnosis not present

## 2020-03-03 DIAGNOSIS — E785 Hyperlipidemia, unspecified: Secondary | ICD-10-CM | POA: Diagnosis not present

## 2020-03-03 DIAGNOSIS — Z923 Personal history of irradiation: Secondary | ICD-10-CM | POA: Diagnosis not present

## 2020-03-03 DIAGNOSIS — R0609 Other forms of dyspnea: Secondary | ICD-10-CM

## 2020-03-03 DIAGNOSIS — R0600 Dyspnea, unspecified: Secondary | ICD-10-CM | POA: Diagnosis not present

## 2020-03-03 DIAGNOSIS — I2510 Atherosclerotic heart disease of native coronary artery without angina pectoris: Secondary | ICD-10-CM | POA: Diagnosis not present

## 2020-03-03 DIAGNOSIS — Z79899 Other long term (current) drug therapy: Secondary | ICD-10-CM | POA: Diagnosis not present

## 2020-03-03 NOTE — Progress Notes (Signed)
Daily Session Note  Patient Details  Name: Jessica [name] MRN: [id number] Date of Birth: 1951-01-18 Referring Provider:     Pulmonary Rehab from 01/04/2020 in [facility] Cardiac and Pulmonary Rehab  Referring Provider  Quay Burow MD      Encounter Date: 03/03/2020  Check In: Session Check In - 03/03/20 4199      Check-In   Supervising physician immediately available to respond to emergencies  See telemetry face sheet for immediately available ER MD    Location  [facility]    Staff Present  Heath Lark, RN, BSN, CCRP;Melissa [place] RDN, Rowe Pavy, BA, ACSM CEP, Exercise Physiologist    Virtual Visit  No    Medication changes reported      No    Fall or balance concerns reported     No    Warm-up and Cool-down  Performed on first and last piece of equipment    Resistance Training Performed  Yes    VAD Patient?  No    PAD/SET Patient?  No          Social History   Tobacco Use  Smoking Status Never Smoker  Smokeless Tobacco Never Used    Goals Met:  Proper associated with RPD/PD & O2 Sat Independence with exercise equipment Exercise tolerated well No report of cardiac concerns or symptoms  Goals Unmet:  Not Applicable  Comments: Pt able to follow exercise prescription today without complaint.  Will continue to monitor for progression.    Dr. Emily Filbert is Medical Director for [place] and [facility].

## 2020-03-07 ENCOUNTER — Telehealth: Payer: Self-pay | Admitting: *Deleted

## 2020-03-07 ENCOUNTER — Encounter: Payer: Self-pay | Admitting: *Deleted

## 2020-03-07 DIAGNOSIS — R0609 Other forms of dyspnea: Secondary | ICD-10-CM

## 2020-03-07 NOTE — Telephone Encounter (Signed)
Jessica [name] just called to discharge from program. She has a different insurance company and now has a copay that she does not want to keep paying.  She is planning to just go back to the [facility] again.  We will discharge her at this time.

## 2020-03-07 NOTE — Progress Notes (Signed)
Pulmonary Individual Treatment Plan  Patient Details  Name: Rojean Ige MRN: [id number] Date of Birth: Dec 04, 1950 Referring Provider:     Pulmonary Rehab from 01/04/2020 in [facility] Cardiac and Pulmonary Rehab  Referring Provider  Quay Burow MD      Initial Encounter Date:    Pulmonary Rehab from 01/04/2020 in [facility] Cardiac and Pulmonary Rehab  Date  01/04/20      Visit Diagnosis: Dyspnea on exertion  Patient's Home Medications on Admission:  Current Outpatient Medications:  .  Ascorbic Acid (VITAMIN C) 1000 MG tablet, Take 2,000 mg by mouth daily., Disp: , Rfl:  .  aspirin EC 325 MG EC tablet, Take 1 tablet (325 mg total) by mouth daily. (Patient taking differently: Take 325 mg by mouth at bedtime. ), Disp: , Rfl: 0 .  Cholecalciferol (VITAMIN D-3) 125 MCG (5000 UT) TABS, Take 5,000 Units by mouth daily., Disp: , Rfl:  .  Evolocumab (REPATHA SURECLICK) 852 MG/ML SOAJ, Inject 140 mg into the skin every 14 (fourteen) days., Disp: 2 pen, Rfl: 11 .  hydrocortisone (ANUSOL-HC) 2.5 % rectal cream, Place 1 application rectally 2 (two) times daily., Disp: 30 g, Rfl: 6 .  loratadine (CLARITIN) 10 MG tablet, Take 10 mg by mouth every evening. , Disp: , Rfl:  .  metoprolol tartrate (LOPRESSOR) 25 MG tablet, Take 0.5 tablets (12.5 mg total) by mouth daily., Disp: 30 tablet, Rfl: 11 .  Multiple Vitamin (MULTIVITAMIN WITH MINERALS) TABS tablet, Take 1 tablet by mouth daily., Disp: , Rfl:  .  predniSONE (STERAPRED UNI-PAK 21 TAB) 10 MG (21) TBPK tablet, Take 6 tablets on the first day and decrease by 1 tablet each day until finished., Disp: 21 tablet, Rfl: 0  Past Medical History: Past Medical History:  Diagnosis Date  . Allergy   . Breast cancer ([place]) 2000  . Cancer [facility])    breast  . Colon polyp   . Coronary artery disease   . GERD (gastroesophageal reflux disease)   . History of blood transfusion   . Hyperlipidemia   . Migraine    history of migrarines, none as an adult  .  Personal history of radiation therapy 2000    Tobacco Use: Social History   Tobacco Use  Smoking Status Never Smoker  Smokeless Tobacco Never Used    Labs: Recent Review Flowsheet Data    Labs for ITP Cardiac and Pulmonary Rehab Latest Ref Rng & Units 01/05/2019 01/05/2019 03/12/2019 06/16/2019 09/21/2019   Cholestrol 0 - 200 mg/dL - - 113 200 115   LDLCALC 0 - 99 mg/dL - - 63 145(H) 62   HDL >39.00 mg/dL - - 33(L) 33.20(L) 37.90(L)   Trlycerides 0.0 - 149.0 mg/dL - - 83 109.0 76.0   Hemoglobin A1c 4.6 - 6.5 % - - - 6.4 5.9   PHART 7.350 - 7.450 7.315(L) 7.265(L) - - -   PCO2ART 32.0 - 48.0 mmHg 47.1 49.9(H) - - -   HCO3 20.0 - 28.0 mmol/L 23.9 22.6 - - -   TCO2 22 - 32 mmol/L 25 24 - - -   ACIDBASEDEF 0.0 - 2.0 mmol/L 2.0 4.0(H) - - -   O2SAT % 97.0 98.0 - - -       Pulmonary Assessment Scores: Pulmonary Assessment Scores    Row Name 01/04/20 1251         ADL UCSD   ADL Phase  Entry     SOB Score total  9     Rest  0     Walk  1     Stairs  4     Bath  0     Dress  0     Shop  0       CAT Score   CAT Score  7       mMRC Score   mMRC Score  1        UCSD: Self-administered rating of dyspnea associated with activities of daily living (ADLs) 6-point scale (0 = "not at all" to 5 = "maximal or unable to do because of breathlessness")  Scoring Scores range from 0 to 120.  Minimally important difference is 5 units  CAT: CAT can identify the health impairment of COPD patients and is better correlated with disease progression.  CAT has a scoring range of zero to 40. The CAT score is classified into four groups of low (less than 10), medium (10 - 20), high (21-30) and very high (31-40) based on the impact level of disease on health status. A CAT score over 10 suggests significant symptoms.  A worsening CAT score could be explained by an exacerbation, poor medication adherence, poor inhaler technique, or progression of COPD or comorbid conditions.  CAT MCID is 2  points  mMRC: mMRC (Modified Medical Research Council) Dyspnea Scale is used to assess the degree of baseline functional disability in patients of respiratory disease due to dyspnea. No minimal important difference is established. A decrease in score of 1 point or greater is considered a positive change.   Pulmonary Function Assessment: Pulmonary Function Assessment - 12/28/19 1410      Breath   Shortness of Breath  Yes;Limiting activity       Exercise Target Goals: Exercise Program Goal: Individual exercise prescription set using results from initial 6 min walk test and THRR while considering  patient's activity barriers and safety.   Exercise Prescription Goal: Initial exercise prescription builds to 30-45 minutes a day of aerobic activity, 2-3 days per week.  Home exercise guidelines will be given to patient during program as part of exercise prescription that the participant will acknowledge.  Education: Aerobic Exercise & Resistance Training: - Gives group verbal and written instruction on the various components of exercise. Focuses on aerobic and resistive training programs and the benefits of this training and how to safely progress through these programs..   Cardiac Rehab from 08/26/2019 in [facility] Cardiac and Pulmonary Rehab  Date  08/26/19  Educator  As  Instruction Review Code  1- Verbalizes Understanding      Education: Exercise & Equipment Safety: - Individual verbal instruction and demonstration of equipment use and safety with use of the equipment.   Pulmonary Rehab from 12/28/2019 in [facility] Cardiac and Pulmonary Rehab  Date  12/28/19  Educator  [facility]  Instruction Review Code  1- Verbalizes Understanding      Education: Exercise Physiology & General Exercise Guidelines: - Group verbal and written instruction with models to review the exercise physiology of the cardiovascular system and associated critical values. Provides general exercise guidelines with specific  guidelines to those with heart or lung disease.    Education: Flexibility, Balance, Mind/Body Relaxation: Provides group verbal/written instruction on the benefits of flexibility and balance training, including mind/body exercise modes such as yoga, pilates and tai chi.  Demonstration and skill practice provided.   Activity Barriers & Risk Stratification: Activity Barriers & Cardiac Risk Stratification - 01/04/20 1227      Activity Barriers & Cardiac Risk Stratification  Activity Barriers  Muscular Weakness;Deconditioning;Shortness of Breath       6 Minute Walk: 6 Minute Walk    Row Name 01/04/20 1225         6 Minute Walk   Phase  Initial     Distance  1635 feet     Walk Time  6 minutes     # of Rest Breaks  0     MPH  2.95     METS  3.2     RPE  11     Perceived Dyspnea   0     VO2 Peak  11.2     Symptoms  No     Resting HR  69 bpm     Resting BP  128/70     Resting Oxygen Saturation   97 %     Exercise Oxygen Saturation  during 6 min walk  97 %     Max Ex. HR  92 bpm     Max Ex. BP  134/70     2 Minute Post BP  124/70       Interval HR   1 Minute HR  79     2 Minute HR  86     3 Minute HR  82     4 Minute HR  89     5 Minute HR  91     6 Minute HR  92     2 Minute Post HR  68     Interval Heart Rate?  Yes       Interval Oxygen   Interval Oxygen?  Yes     Baseline Oxygen Saturation %  97 %     1 Minute Oxygen Saturation %  97 %     1 Minute Liters of Oxygen  0 L Room Air     2 Minute Oxygen Saturation %  95 %     2 Minute Liters of Oxygen  0 L     3 Minute Oxygen Saturation %  97 %     3 Minute Liters of Oxygen  0 L     4 Minute Oxygen Saturation %  97 %     4 Minute Liters of Oxygen  0 L     5 Minute Oxygen Saturation %  97 %     5 Minute Liters of Oxygen  0 L     6 Minute Oxygen Saturation %  97 %     6 Minute Liters of Oxygen  0 L     2 Minute Post Oxygen Saturation %  98 %     2 Minute Post Liters of Oxygen  0 L       Oxygen Initial  Assessment: Oxygen Initial Assessment - 12/28/19 1409      Home Oxygen   Home Oxygen Device  None    Sleep Oxygen Prescription  None    Home Exercise Oxygen Prescription  None    Home at Rest Exercise Oxygen Prescription  None      Initial 6 min Walk   Oxygen Used  None      Program Oxygen Prescription   Program Oxygen Prescription  None      Intervention   Short Term Goals  To learn and understand importance of monitoring SPO2 with pulse oximeter and demonstrate accurate use of the pulse oximeter.;To learn and understand importance of maintaining oxygen saturations>88%;To learn and demonstrate proper pursed lip breathing techniques or other breathing  techniques.    Long  Term Goals  Verbalizes importance of monitoring SPO2 with pulse oximeter and return demonstration;Maintenance of O2 saturations>88%;Compliance with respiratory medication;Exhibits proper breathing techniques, such as pursed lip breathing or other method taught during program session       Oxygen Re-Evaluation: Oxygen Re-Evaluation    Row Name 01/11/20 0857 01/18/20 0821 02/23/20 0844         Program Oxygen Prescription   Program Oxygen Prescription  None  None  None       Home Oxygen   Home Oxygen Device  None  None  None     Sleep Oxygen Prescription  None  None  None     Home Exercise Oxygen Prescription  None  None  None     Home at Rest Exercise Oxygen Prescription  None  None  None     Compliance with Home Oxygen Use  Yes  Yes  Yes       Goals/Expected Outcomes   Short Term Goals  To learn and demonstrate proper pursed lip breathing techniques or other breathing techniques.  To learn and demonstrate proper pursed lip breathing techniques or other breathing techniques.  To learn and demonstrate proper pursed lip breathing techniques or other breathing techniques.;To learn and understand importance of monitoring SPO2 with pulse oximeter and demonstrate accurate use of the pulse oximeter.;To learn and  understand importance of maintaining oxygen saturations>88%     Long  Term Goals  Exhibits proper breathing techniques, such as pursed lip breathing or other method taught during program session  Exhibits proper breathing techniques, such as pursed lip breathing or other method taught during program session  Verbalizes importance of monitoring SPO2 with pulse oximeter and return demonstration;Maintenance of O2 saturations>88%;Exhibits proper breathing techniques, such as pursed lip breathing or other method taught during program session     Comments  Reviewed PLB technique with pt.  Talked about how it works and it's importance in maintaining their exercise saturations.  Rosamae stated that she is using PLB and her shortness of breath does not limit her daily activities very much. She used to get SOB going up stairs but has noticed an improvement  in this.  Masiah is doing better with her breathing and feels less SOB now.  She uses her PLB to help her with breath control.     Goals/Expected Outcomes  Short: Become more profiecient at using PLB.   Long: Become independent at using PLB.  Short: Continue consistent exercise routine and PLB. Long: Become independent with exercise routine and PLB.  Short; Conitnue to work on PLB for improved SOB  Long: Continue to improve stamina to improve SOB.        Oxygen Discharge (Final Oxygen Re-Evaluation): Oxygen Re-Evaluation - 02/23/20 0844      Program Oxygen Prescription   Program Oxygen Prescription  None      Home Oxygen   Home Oxygen Device  None    Sleep Oxygen Prescription  None    Home Exercise Oxygen Prescription  None    Home at Rest Exercise Oxygen Prescription  None    Compliance with Home Oxygen Use  Yes      Goals/Expected Outcomes   Short Term Goals  To learn and demonstrate proper pursed lip breathing techniques or other breathing techniques.;To learn and understand importance of monitoring SPO2 with pulse oximeter and demonstrate  accurate use of the pulse oximeter.;To learn and understand importance of maintaining oxygen saturations>88%    Long  Term Goals  Verbalizes importance of monitoring SPO2 with pulse oximeter and return demonstration;Maintenance of O2 saturations>88%;Exhibits proper breathing techniques, such as pursed lip breathing or other method taught during program session    Comments  Genell is doing better with her breathing and feels less SOB now.  She uses her PLB to help her with breath control.    Goals/Expected Outcomes  Short; Conitnue to work on PLB for improved SOB  Long: Continue to improve stamina to improve SOB.       Initial Exercise Prescription: Initial Exercise Prescription - 01/04/20 1200      Date of Initial Exercise RX and Referring Provider   Date  01/04/20    Referring Provider  Quay Burow MD      Treadmill   MPH  2.5    Grade  0.5    Minutes  15    METs  3.09      Recumbant Bike   Level  3    RPM  50    Watts  32    Minutes  15    METs  3      Arm Ergometer   Level  2    Watts  42    RPM  25    Minutes  15    METs  3      Elliptical   Level  1    Speed  3    Minutes  15    METs  3      Prescription Details   Frequency (times per week)  3    Duration  Progress to 30 minutes of continuous aerobic without signs/symptoms of physical distress      Intensity   THRR 40-80% of Max Heartrate  102-135    Ratings of Perceived Exertion  11-13    Perceived Dyspnea  0-4      Progression   Progression  Continue to progress workloads to maintain intensity without signs/symptoms of physical distress.      Resistance Training   Training Prescription  Yes    Weight  3 lb    Reps  10-15       Perform Capillary Blood Glucose checks as needed.  Exercise Prescription Changes: Exercise Prescription Changes    Row Name 01/04/20 1200 01/20/20 1000 02/05/20 0800 02/11/20 0800 02/16/20 1600     Response to Exercise   Blood Pressure (Admit)  128/70  110/60  104/58   --  118/68   Blood Pressure (Exercise)  134/70  100/58  144/74  --  138/64   Blood Pressure (Exit)  112/64  136/64  122/72  --  128/72   Heart Rate (Admit)  69 bpm  61 bpm  57 bpm  --  58 bpm   Heart Rate (Exercise)  92 bpm  111 bpm  99 bpm  --  101 bpm   Heart Rate (Exit)  65 bpm  69 bpm  67 bpm  --  73 bpm   Oxygen Saturation (Admit)  97 %  97 %  99 %  --  97 %   Oxygen Saturation (Exercise)  95 %  92 %  93 %  --  98 %   Oxygen Saturation (Exit)  98 %  97 %  99 %  --  99 %   Rating of Perceived Exertion (Exercise)  _0 --  12   Perceived Dyspnea (Exercise)  0  1  1  --  1   Symptoms  none  none  none  --  none   Comments  walk test results  hard time on elliptical  --  --  --   Duration  --  Continue with 30 min of aerobic exercise without signs/symptoms of physical distress.  Continue with 30 min of aerobic exercise without signs/symptoms of physical distress.  --  Continue with 30 min of aerobic exercise without signs/symptoms of physical distress.   Intensity  --  THRR unchanged  THRR unchanged  --  THRR unchanged     Progression   Progression  --  Continue to progress workloads to maintain intensity without signs/symptoms of physical distress.  Continue to progress workloads to maintain intensity without signs/symptoms of physical distress.  --  Continue to progress workloads to maintain intensity without signs/symptoms of physical distress.   Average METs  --  3  3  --  2.72     Resistance Training   Training Prescription  --  Yes  Yes  --  Yes   Weight  --  3 lb  3 lb  --  3 lb   Reps  --  10-15  10-15  --  10-15     Interval Training   Interval Training  --  No  No  --  No     Treadmill   MPH  --  3  --  --  3.5   Grade  --  0.5  --  --  1   Minutes  --  15  --  --  15   METs  --  3.5  --  --  4.16     Recumbant Bike   Level  --  3  3  --  3   RPM  --  --  50  --  --   Watts  --  --  22  --  --   Minutes  --  15  15  --  15   METs  --  --  3.16  --  --      Arm Ergometer   Level  --  2  2  --  2   RPM  --  --  25  --  --   Minutes  --  15  15  --  15   METs  --  2.5  --  --  2.1     REL-XR   Level  --  --  --  --  2   Minutes  --  --  --  --  15   METs  --  --  --  --  1.9     Home Exercise Plan   Plans to continue exercise at  --  --  --  Home (comment) walking  Home (comment) walking   Frequency  --  --  --  Add 2 additional days to program exercise sessions.  Add 2 additional days to program exercise sessions.   Initial Home Exercises Provided  --  --  --  02/11/20  02/11/20   Row Name 03/01/20 1500             Response to Exercise   Blood Pressure (Admit)  128/64       Blood Pressure (Exercise)  130/64       Blood Pressure (Exit)  112/64       Heart Rate (Admit)  75 bpm       Heart Rate (  Exercise)  92 bpm       Heart Rate (Exit)  69 bpm       Oxygen Saturation (Admit)  97 %       Oxygen Saturation (Exercise)  98 %       Oxygen Saturation (Exit)  99 %       Rating of Perceived Exertion (Exercise)  12       Symptoms  none       Duration  Continue with 30 min of aerobic exercise without signs/symptoms of physical distress.       Intensity  THRR unchanged         Progression   Progression  Continue to progress workloads to maintain intensity without signs/symptoms of physical distress.       Average METs  3         Resistance Training   Training Prescription  Yes       Weight  3 lb       Reps  10-15         Interval Training   Interval Training  No         Treadmill   MPH  3.5       Grade  1       Minutes  15       METs  4.16         Recumbant Elliptical   Level  2       RPM  50       Minutes  15       METs  1.9         Home Exercise Plan   Plans to continue exercise at  Home (comment) walking       Frequency  Add 2 additional days to program exercise sessions.       Initial Home Exercises Provided  02/11/20          Exercise Comments: Exercise Comments    Row Name 01/11/20 [phone]            Exercise Comments  First full day of exercise!  Patient was oriented to gym and equipment including functions, settings, policies, and procedures.  Patient's individual exercise prescription and treatment plan were reviewed.  All starting workloads were established based on the results of the 6 minute walk test done at initial orientation visit.  The plan for exercise progression was also introduced and progression will be customized based on patient's performance and goals.          Exercise Goals and Review: Exercise Goals    Row Name 01/04/20 1230             Exercise Goals   Increase Physical Activity  Yes       Intervention  Provide advice, education, support and counseling about physical activity/exercise needs.;Develop an individualized exercise prescription for aerobic and resistive training based on initial evaluation findings, risk stratification, comorbidities and participant's personal goals.       Expected Outcomes  Short Term: Attend rehab on a regular basis to increase amount of physical activity.;Long Term: Add in home exercise to make exercise part of routine and to increase amount of physical activity.;Long Term: Exercising regularly at least 3-5 days a week.       Increase Strength and Stamina  Yes       Intervention  Provide advice, education, support and counseling about physical activity/exercise needs.;Develop an individualized exercise prescription for aerobic and resistive training based on  initial evaluation findings, risk stratification, comorbidities and participant's personal goals.       Expected Outcomes  Short Term: Increase workloads from initial exercise prescription for resistance, speed, and METs.;Short Term: Perform resistance training exercises routinely during rehab and add in resistance training at home;Long Term: Improve cardiorespiratory fitness, muscular endurance and strength as measured by increased METs and functional capacity (6MWT)       Able to  understand and use rate of perceived exertion (RPE) scale  Yes       Intervention  Provide education and explanation on how to use RPE scale       Expected Outcomes  Short Term: Able to use RPE daily in rehab to express subjective intensity level;Long Term:  Able to use RPE to guide intensity level when exercising independently       Able to understand and use Dyspnea scale  Yes       Intervention  Provide education and explanation on how to use Dyspnea scale       Expected Outcomes  Short Term: Able to use Dyspnea scale daily in rehab to express subjective sense of shortness of breath during exertion;Long Term: Able to use Dyspnea scale to guide intensity level when exercising independently       Knowledge and understanding of Target Heart Rate Range (THRR)  Yes       Intervention  Provide education and explanation of THRR including how the numbers were predicted and where they are located for reference       Expected Outcomes  Short Term: Able to state/look up THRR;Short Term: Able to use daily as guideline for intensity in rehab;Long Term: Able to use THRR to govern intensity when exercising independently       Able to check pulse independently  Yes       Intervention  Provide education and demonstration on how to check pulse in carotid and radial arteries.;Review the importance of being able to check your own pulse for safety during independent exercise       Expected Outcomes  Short Term: Able to explain why pulse checking is important during independent exercise;Long Term: Able to check pulse independently and accurately       Understanding of Exercise Prescription  Yes       Intervention  Provide education, explanation, and written materials on patient's individual exercise prescription       Expected Outcomes  Short Term: Able to explain program exercise prescription;Long Term: Able to explain home exercise prescription to exercise independently          Exercise Goals Re-Evaluation  : Exercise Goals Re-Evaluation    Row Name 01/11/20 0855 01/18/20 0829 02/05/20 0854 02/11/20 0845 02/16/20 1629     Exercise Goal Re-Evaluation   Exercise Goals Review  Able to understand and use rate of perceived exertion (RPE) scale;Able to understand and use Dyspnea scale;Knowledge and understanding of Target Heart Rate Range (THRR);Understanding of Exercise Prescription  Able to understand and use rate of perceived exertion (RPE) scale;Able to understand and use Dyspnea scale;Knowledge and understanding of Target Heart Rate Range (THRR);Understanding of Exercise Prescription  Increase Physical Activity;Increase Strength and Stamina;Able to understand and use rate of perceived exertion (RPE) scale;Able to understand and use Dyspnea scale;Knowledge and understanding of Target Heart Rate Range (THRR);Able to check pulse independently;Understanding of Exercise Prescription  Increase Physical Activity;Increase Strength and Stamina;Able to understand and use rate of perceived exertion (RPE) scale;Able to understand and use Dyspnea scale;Knowledge and understanding of Target  Heart Rate Range (THRR);Able to check pulse independently;Understanding of Exercise Prescription  Increase Strength and Stamina;Increase Physical Activity;Understanding of Exercise Prescription   Comments  Reviewed RPE scale, THR and program prescription with pt today.  Pt voiced understanding and was given a copy of goals to take home. Short: Use RPE  daily to regulate intensity. Long: Follow program prescription in THR.  Tiffinie stated that she feels she is doing well with her exercise except for the eliptical. It is difficult for her exercising on the eliptical with her mask. He was given a breathable mask to try to see if that helps with breathing while exercising. She continues to progress with intensity and get stronger.  Tammara is attending consistently.  her 02 stays in high 90s with exercise.  Staff will monitor progress.  Updated  home exercise with pt today.  Pt plans to walk at home for exercise.  Reviewed THR, pulse, RPE, sign and symptoms, NTG use, and when to call 911 or MD.  Also discussed weather considerations and indoor options.  Pt voiced understanding.  Nyriah is doing well in rehab.  She is now up to 3.5 mph on the treadmill.  We will also continue to encourage her to improve METs on XR and continue to monitor her progress.   Expected Outcomes  Short: Use RPE daily to regulate intensity. Long: Follow program prescription in THR.  Short: Try breathable mask with exercise to see if it improves SOB. Long: Follow program prescription in THR.  Short: continue to exercise consistently  Long:  continue to build stamina  Short: Continue to add in exercise  Long: Contnue to improve stamina.  Short: Improve METs on XR.  Long: Continue to improve stamina.   [place] Name 02/23/20 0818 03/01/20 1520           Exercise Goal Re-Evaluation   Exercise Goals Review  Increase Strength and Stamina;Increase Physical Activity;Understanding of Exercise Prescription  Increase Strength and Stamina;Increase Physical Activity;Understanding of Exercise Prescription      Comments  Marchella is doing well in rehab.  She is not doing well on her own at home. She would like to go back to [facility] for her off days from rehab.  She is feeling better being back in class.  Jaclynn attends consistenly and works at [employer]. Staff will encourage her to get back to [facility] on days off.      Expected Outcomes  Short; Rejoin [facility]  Long: Continue to improve stamina again.  Short: add back [facility] Long: continue to improve MET level         Discharge Exercise Prescription (Final Exercise Prescription Changes): Exercise Prescription Changes - 03/01/20 1500      Response to Exercise   Blood Pressure (Admit)  128/64    Blood Pressure (Exercise)  130/64    Blood Pressure (Exit)  112/64    Heart Rate (Admit)  75 bpm    Heart Rate (Exercise)  92 bpm     Heart Rate (Exit)  69 bpm    Oxygen Saturation (Admit)  97 %    Oxygen Saturation (Exercise)  98 %    Oxygen Saturation (Exit)  99 %    Rating of Perceived Exertion (Exercise)  12    Symptoms  none    Duration  Continue with 30 min of aerobic exercise without signs/symptoms of physical distress.    Intensity  THRR unchanged      Progression   Progression  Continue to progress workloads to  maintain intensity without signs/symptoms of physical distress.    Average METs  3      Resistance Training   Training Prescription  Yes    Weight  3 lb    Reps  10-15      Interval Training   Interval Training  No      Treadmill   MPH  3.5    Grade  1    Minutes  15    METs  4.16      Recumbant Elliptical   Level  2    RPM  50    Minutes  15    METs  1.9      Home Exercise Plan   Plans to continue exercise at  Home (comment)   walking   Frequency  Add 2 additional days to program exercise sessions.    Initial Home Exercises Provided  02/11/20       Nutrition:  Target Goals: Understanding of nutrition guidelines, daily intake of sodium <1568m, cholesterol <2045m calories 30% from fat and 7% or less from saturated fats, daily to have 5 or more servings of fruits and vegetables.  Education: Controlling Sodium/Reading Food Labels -Group verbal and written material supporting the discussion of sodium use in heart healthy nutrition. Review and explanation with models, verbal and written materials for utilization of the food label.   Education: General Nutrition Guidelines/Fats and Fiber: -Group instruction provided by verbal, written material, models and posters to present the general guidelines for heart healthy nutrition. Gives an explanation and review of dietary fats and fiber.   Biometrics: Pre Biometrics - 01/04/20 1231      Pre Biometrics   Height  5' 6.75" (1.695 m)    Weight  190 lb 3.2 oz (86.3 kg)    BMI (Calculated)  30.03    Single Leg Stand  6.62 seconds         Nutrition Therapy Plan and Nutrition Goals: Nutrition Therapy & Goals - 02/01/20 0901      Nutrition Therapy   Diet  HH, low Na diet    Drug/Food Interactions  --    Protein (specify units)  65g    Fiber  25 grams    Whole Grain Foods  3 servings    Saturated Fats  12 max. grams    Fruits and Vegetables  5 servings/day    Sodium  1.5 grams      Personal Nutrition Goals   Nutrition Goal  ST: 3-day food log LT: maintain wt, increase energy further, manage healthy    Comments  Pt reports choosing small portions, but still eating HH diet and maintaining changes from last time at rehab. Pt is still eating one big meal a day, reviewed that to meet all of her needs is hard in one meal so encouraged pt to follow hunger cues for portion sizes, make sure having at least one good source of protein, and keeping a variety. Reviewed HH eating. Pt with no questions at this time. Will do 3-day food log to ensure meeting needs.      Intervention Plan   Intervention  Prescribe, educate and counsel regarding individualized specific dietary modifications aiming towards targeted core components such as weight, hypertension, lipid management, diabetes, heart failure and other comorbidities.;Nutrition handout(s) given to patient.    Expected Outcomes  Short Term Goal: Understand basic principles of dietary content, such as calories, fat, sodium, cholesterol and nutrients.;Short Term Goal: A plan has been developed with personal nutrition goals  set during dietitian appointment.;Long Term Goal: Adherence to prescribed nutrition plan.       Nutrition Assessments: Nutrition Assessments - 01/04/20 1231      MEDFICTS Scores   Pre Score  18       MEDIFICTS Score Key:          ?70 Need to make dietary changes          40-70 Heart Healthy Diet         ? 40 Therapeutic Level Cholesterol Diet  Nutrition Goals Re-Evaluation: Nutrition Goals Re-Evaluation    [place] Name 03/01/20 0830             Goals    Nutrition Goal  ST: 3-day food log LT: maintain wt, increase energy further, manage healthy       Comment  Continue with current changes.       Expected Outcome  Increase variety and increase sustainability          Nutrition Goals Discharge (Final Nutrition Goals Re-Evaluation): Nutrition Goals Re-Evaluation - 03/01/20 0830      Goals   Nutrition Goal  ST: 3-day food log LT: maintain wt, increase energy further, manage healthy    Comment  Continue with current changes.    Expected Outcome  Increase variety and increase sustainability       Psychosocial: Target Goals: Acknowledge presence or absence of significant depression and/or stress, maximize coping skills, provide positive support system. Participant is able to verbalize types and ability to use techniques and skills needed for reducing stress and depression.   Education: Depression - Provides group verbal and written instruction on the correlation between heart/lung disease and depressed mood, treatment options, and the stigmas associated with seeking treatment.   Education: Sleep Hygiene -Provides group verbal and written instruction about how sleep can affect your health.  Define sleep hygiene, discuss sleep cycles and impact of sleep habits. Review good sleep hygiene tips.    Education: Stress and Anxiety: - Provides group verbal and written instruction about the health risks of elevated stress and causes of high stress.  Discuss the correlation between heart/lung disease and anxiety and treatment options. Review healthy ways to manage with stress and anxiety.   Initial Review & Psychosocial Screening: Initial Psych Review & Screening - 12/28/19 1411      Initial Review   Current issues with  Current Sleep Concerns    Comments  Kambria states that she has never had very good sleep.      Family Dynamics   Good Support System?  Yes    Comments  She can talk to her husband and friedns for support      Barriers    Psychosocial barriers to participate in program  The patient should benefit from training in stress management and relaxation.      Screening Interventions   Interventions  Encouraged to exercise;Provide feedback about the scores to participant;To provide support and resources with identified psychosocial needs    Expected Outcomes  Short Term goal: Utilizing psychosocial counselor, staff and physician to assist with identification of specific Stressors or current issues interfering with healing process. Setting desired goal for each stressor or current issue identified.;Long Term Goal: Stressors or current issues are controlled or eliminated.;Short Term goal: Identification and review with participant of any Quality of Life or Depression concerns found by scoring the questionnaire.;Long Term goal: The participant improves quality of Life and PHQ9 Scores as seen by post scores and/or verbalization of changes  Quality of Life Scores:  Scores of 19 and below usually indicate a poorer quality of life in these areas.  A difference of  2-3 points is a clinically meaningful difference.  A difference of 2-3 points in the total score of the Quality of Life Index has been associated with significant improvement in overall quality of life, self-image, physical symptoms, and general health in studies assessing change in quality of life.  PHQ-9: Recent Review Flowsheet Data    Depression screen [facility] 2/9 01/04/2020 05/18/2019 12/31/2018 10/03/2017 09/16/2017   Decreased Interest 0 1 0 0 0   Down, Depressed, Hopeless 0 0 0 0 0   PHQ - 2 Score 0 1 0 0 0   Altered sleeping 3 0 - 3 -   Tired, decreased energy 1 1 - 1 -   Change in appetite 0 0 - 0 -   Feeling bad or failure about yourself  0 0 - 0 -   Trouble concentrating 0 0 - 0 -   Moving slowly or fidgety/restless 0 0 - 0 -   Suicidal thoughts 0 0 - 0 -   PHQ-9 Score 4 2 - 4 -   Difficult doing work/chores Not difficult at all Not difficult at all -  Not difficult at all -     Interpretation of Total Score  Total Score Depression Severity:  1-4 = Minimal depression, 5-9 = Mild depression, 10-14 = Moderate depression, 15-19 = Moderately severe depression, 20-27 = Severe depression   Psychosocial Evaluation and Intervention: Psychosocial Evaluation - 12/28/19 1414      Psychosocial Evaluation & Interventions   Interventions  Encouraged to exercise with the program and follow exercise prescription    Comments  She has a positive outlook on the program and whats she needs to do to lose weight and be less short of breath/    Expected Outcomes  Short: Attend [facility] stress management education to decrease stress. Long: Maintain exercise Post [facility] to keep stress at a minimum.    Continue Psychosocial Services   Follow up required by staff       Psychosocial Re-Evaluation: Psychosocial Re-Evaluation    [place] Name 01/18/20 0815 02/23/20 0820           Psychosocial Re-Evaluation   Current issues with  None Identified  None Identified      Comments  Ryn reports no new stress concerns and reports having a strong support system.  Francesca is doing well mentally and in a good place.  She is sleeping well and denies any major stressors currently.      Expected Outcomes  Short: continue healhty sleep patterns Long: maintain good mental health and stress management.  Short: Continue to stay positive  Long: Continue to exercise for mental health.      Interventions  Encouraged to attend Cardiac Rehabilitation for the exercise  Encouraged to attend Pulmonary Rehabilitation for the exercise      Continue Psychosocial Services   Follow up required by staff  Follow up required by staff         Psychosocial Discharge (Final Psychosocial Re-Evaluation): Psychosocial Re-Evaluation - 02/23/20 0820      Psychosocial Re-Evaluation   Current issues with  None Identified    Comments  Teddie is doing well mentally and in a good place.  She is  sleeping well and denies any major stressors currently.    Expected Outcomes  Short: Continue to stay positive  Long: Continue to exercise for mental  health.    Interventions  Encouraged to attend Pulmonary Rehabilitation for the exercise    Continue Psychosocial Services   Follow up required by staff       Education: Education Goals: Education classes will be provided on a weekly basis, covering required topics. Participant will state understanding/return demonstration of topics presented.  Learning Barriers/Preferences: Learning Barriers/Preferences - 12/28/19 1412      Learning Barriers/Preferences   Learning Barriers  None    Learning Preferences  None       General Pulmonary Education Topics:  Infection Prevention: - Provides verbal and written material to individual with discussion of infection control including proper hand washing and proper equipment cleaning during exercise session.   Pulmonary Rehab from 12/28/2019 in [facility] Cardiac and Pulmonary Rehab  Date  12/28/19  Educator  [facility]  Instruction Review Code  1- Verbalizes Understanding      Falls Prevention: - Provides verbal and written material to individual with discussion of falls prevention and safety.   Pulmonary Rehab from 12/28/2019 in [facility] Cardiac and Pulmonary Rehab  Date  12/28/19  Educator  [facility]  Instruction Review Code  1- Verbalizes Understanding      Chronic Lung Diseases: - Group verbal and written instruction to review updates, respiratory medications, advancements in procedures and treatments. Discuss use of supplemental oxygen including available portable oxygen systems, continuous and intermittent flow rates, concentrators, personal use and safety guidelines. Review proper use of inhaler and spacers. Provide informative websites for self-education.    Energy Conservation: - Provide group verbal and written instruction for methods to conserve energy, plan and organize activities. Instruct on pacing  techniques, use of adaptive equipment and posture/positioning to relieve shortness of breath.   Triggers and Exacerbations: - Group verbal and written instruction to review types of environmental triggers and ways to prevent exacerbations. Discuss weather changes, air quality and the benefits of nasal washing. Review warning signs and symptoms to help prevent infections. Discuss techniques for effective airway clearance, coughing, and vibrations.   AED/CPR: - Group verbal and written instruction with the use of models to demonstrate the basic use of the AED with the basic ABC's of resuscitation.   Anatomy and Physiology of the Lungs: - Group verbal and written instruction with the use of models to provide basic lung anatomy and physiology related to function, structure and complications of lung disease.   Anatomy & Physiology of the Heart: - Group verbal and written instruction and models provide basic cardiac anatomy and physiology, with the coronary electrical and arterial systems. Review of Valvular disease and Heart Failure   Cardiac Medications: - Group verbal and written instruction to review commonly prescribed medications for heart disease. Reviews the medication, class of the drug, and side effects.   Other: -Provides group and verbal instruction on various topics (see comments)   Knowledge Questionnaire Score: Knowledge Questionnaire Score - 01/04/20 1232      Knowledge Questionnaire Score   Pre Score  15/18 Education Focus: COPD,        Core Components/Risk Factors/Patient Goals at Admission: Personal Goals and Risk Factors at Admission - 01/04/20 1250      Core Components/Risk Factors/Patient Goals on Admission    Weight Management  Yes;Weight Loss    Intervention  Weight Management: Develop a combined nutrition and exercise program designed to reach desired caloric intake, while maintaining appropriate intake of nutrient and fiber, sodium and fats, and appropriate  energy expenditure required for the weight goal.;Weight Management: Provide education and appropriate  resources to help participant work on and attain dietary goals.    Admit Weight  190 lb 3.2 oz (86.3 kg)    Goal Weight: Short Term  185 lb (83.9 kg)    Goal Weight: Long Term  180 lb (81.6 kg)    Expected Outcomes  Short Term: Continue to assess and modify interventions until short term weight is achieved;Long Term: Adherence to nutrition and physical activity/exercise program aimed toward attainment of established weight goal;Weight Loss: Understanding of general recommendations for a balanced deficit meal plan, which promotes 1-2 lb weight loss per week and includes a negative energy balance of [phone] kcal/d;Understanding recommendations for meals to include 15-35% energy as protein, 25-35% energy from fat, 35-60% energy from carbohydrates, less than 261m of dietary cholesterol, 20-35 gm of total fiber daily;Understanding of distribution of calorie intake throughout the day with the consumption of 4-5 meals/snacks    Improve shortness of breath with ADL's  Yes    Intervention  Provide education, individualized exercise plan and daily activity instruction to help decrease symptoms of SOB with activities of daily living.    Expected Outcomes  Long Term: Be able to perform more ADLs without symptoms or delay the onset of symptoms;Short Term: Improve cardiorespiratory fitness to achieve a reduction of symptoms when performing ADLs    Lipids  Yes    Intervention  Provide education and support for participant on nutrition & aerobic/resistive exercise along with prescribed medications to achieve LDL <764m HDL >4051m   Expected Outcomes  Short Term: Participant states understanding of desired cholesterol values and is compliant with medications prescribed. Participant is following exercise prescription and nutrition guidelines.;Long Term: Cholesterol controlled with medications as prescribed, with  individualized exercise RX and with personalized nutrition plan. Value goals: LDL < 12m34mDL > 40 mg.       Education:Diabetes - Individual verbal and written instruction to review signs/symptoms of diabetes, desired ranges of glucose level fasting, after meals and with exercise. Acknowledge that pre and post exercise glucose checks will be done for 3 sessions at entry of program.   Education: Know Your Numbers and Risk Factors: -Group verbal and written instruction about important numbers in your health.  Discussion of what are risk factors and how they play a role in the disease process.  Review of Cholesterol, Blood Pressure, Diabetes, and BMI and the role they play in your overall health.   [facility]  Date  08/12/19  Educator  [facility]  Instruction Review Code  1- Verbalizes Understanding      Core Components/Risk Factors/Patient Goals Review:  Goals and Risk Factor Review    Row Name 01/18/20 0813 02/23/20 0821           Core Components/Risk Factors/Patient Goals Review   Personal Goals Review  Weight Management/Obesity;Hypertension;Lipids  Weight Management/Obesity;Hypertension;Lipids      Review  MarlReyes been mainatining her weight with small ups and downs. She reports taking all medications as prescribed and maintaining her BP and lipids in acceptable ranges.  MarlShyniceis doing well in rehab.  She is drinking lots of chocolate milk as a snack and her weight has been up and down.  Her pressures have been good and she continues to check them at home.      Expected Outcomes  Short: Continue to work on weight loss.  Long: Continue to monitor risk factors.  Short: Continue to work on weight loss and reduce snacking  Long: Continue to monitor risk factors.         Core Components/Risk Factors/Patient Goals at Discharge (Final Review):  Goals and Risk Factor Review - 02/23/20 0821      Core Components/Risk Factors/Patient  Goals Review   Personal Goals Review  Weight Management/Obesity;Hypertension;Lipids    Review  Bridgett is is doing well in rehab.  She is drinking lots of chocolate milk as a snack and her weight has been up and down.  Her pressures have been good and she continues to check them at home.    Expected Outcomes  Short: Continue to work on weight loss and reduce snacking  Long: Continue to monitor risk factors.       ITP Comments: ITP Comments    Row Name 12/28/19 1421 01/04/20 1224 01/11/20 0855 01/13/20 0642 02/01/20 0909   ITP Comments  Virtual Orientation performed. Patient informed when to come in for RD and EP orientation. Diagnosis can be found in [facility] 12/22/2019  Completed 6MWT and gym orientation.  Initial ITP created and sent for review to Dr. Emily Filbert, Medical Director.  First full day of exercise!  Patient was oriented to gym and equipment including functions, settings, policies, and procedures.  Patient's individual exercise prescription and treatment plan were reviewed.  All starting workloads were established based on the results of the 6 minute walk test done at initial orientation visit.  The plan for exercise progression was also introduced and progression will be customized based on patient's performance and goals.  30 day chart review completed. ITP sent to Dr Zachery Dakins Medical Director, for review,changes as needed and signature. Continue with ITP if no changes requested New to program  Completed Initial RD Eval   Row Name 02/10/20 0606 03/07/20 1356         ITP Comments  30 Day review completed. Medical Director review done, changes made as directed,and approval shown by signature of Market researcher.  Dartha just called to discharge from program. She has a different insurance company and now has a copay that she does not want to keep paying.  She is planning to just go back to the [facility] again.  We will discharge her at this time.         Comments: discharge ITP

## 2020-03-07 NOTE — Progress Notes (Signed)
Discharge Progress Report  Patient Details  Name: Jessica [name] MRN: [id number] Date of Birth: Aug 17, 1951 Referring Provider:     Pulmonary Rehab from 01/04/2020 in [facility]  Referring Provider  Quay Burow MD       Number of Visits: 17/36  Reason for Discharge:  Early Exit:  Insurance  Smoking History:  Social History   Tobacco Use  Smoking Status Never Smoker  Smokeless Tobacco Never Used    Diagnosis:  Dyspnea on exertion  ADL UCSD: Pulmonary Assessment Scores    Row Name 01/04/20 1251         ADL UCSD   ADL Phase  Entry     SOB Score total  9     Rest  0     Walk  1     Stairs  4     Bath  0     Dress  0     Shop  0       CAT Score   CAT Score  7       mMRC Score   mMRC Score  1        Initial Exercise Prescription: Initial Exercise Prescription - 01/04/20 1200      Date of Initial Exercise RX and Referring Provider   Date  01/04/20    Referring Provider  Quay Burow MD      Treadmill   MPH  2.5    Grade  0.5    Minutes  15    METs  3.09      Recumbant Bike   Level  3    RPM  50    Watts  32    Minutes  15    METs  3      Arm Ergometer   Level  2    Watts  42    RPM  25    Minutes  15    METs  3      Elliptical   Level  1    Speed  3    Minutes  15    METs  3      Prescription Details   Frequency (times per week)  3    Duration  Progress to 30 minutes of continuous aerobic without signs/symptoms of physical distress      Intensity   THRR 40-80% of Max Heartrate  102-135    Ratings of Perceived Exertion  11-13    Perceived Dyspnea  0-4      Progression   Progression  Continue to progress workloads to maintain intensity without signs/symptoms of physical distress.      Resistance Training   Training Prescription  Yes    Weight  3 lb    Reps  10-15       Discharge Exercise Prescription (Final Exercise Prescription Changes): Exercise Prescription Changes - 03/01/20 1500       Response to Exercise   Blood Pressure (Admit)  128/64    Blood Pressure (Exercise)  130/64    Blood Pressure (Exit)  112/64    Heart Rate (Admit)  75 bpm    Heart Rate (Exercise)  92 bpm    Heart Rate (Exit)  69 bpm    Oxygen Saturation (Admit)  97 %    Oxygen Saturation (Exercise)  98 %    Oxygen Saturation (Exit)  99 %    Rating of Perceived Exertion (Exercise)  12    Symptoms  none  Duration  Continue with 30 min of aerobic exercise without signs/symptoms of physical distress.    Intensity  THRR unchanged      Progression   Progression  Continue to progress workloads to maintain intensity without signs/symptoms of physical distress.    Average METs  3      Resistance Training   Training Prescription  Yes    Weight  3 lb    Reps  10-15      Interval Training   Interval Training  No      Treadmill   MPH  3.5    Grade  1    Minutes  15    METs  4.16      Recumbant Elliptical   Level  2    RPM  50    Minutes  15    METs  1.9      Home Exercise Plan   Plans to continue exercise at  Home (comment)   walking   Frequency  Add 2 additional days to program exercise sessions.    Initial Home Exercises Provided  02/11/20       Functional Capacity: 6 Minute Walk    Row Name 01/04/20 1225         6 Minute Walk   Phase  Initial     Distance  1635 feet     Walk Time  6 minutes     # of Rest Breaks  0     MPH  2.95     METS  3.2     RPE  11     Perceived Dyspnea   0     VO2 Peak  11.2     Symptoms  No     Resting HR  69 bpm     Resting BP  128/70     Resting Oxygen Saturation   97 %     Exercise Oxygen Saturation  during 6 min walk  97 %     Max Ex. HR  92 bpm     Max Ex. BP  134/70     2 Minute Post BP  124/70       Interval HR   1 Minute HR  79     2 Minute HR  86     3 Minute HR  82     4 Minute HR  89     5 Minute HR  91     6 Minute HR  92     2 Minute Post HR  68     Interval Heart Rate?  Yes       Interval Oxygen   Interval Oxygen?  Yes      Baseline Oxygen Saturation %  97 %     1 Minute Oxygen Saturation %  97 %     1 Minute Liters of Oxygen  0 L Room Air     2 Minute Oxygen Saturation %  95 %     2 Minute Liters of Oxygen  0 L     3 Minute Oxygen Saturation %  97 %     3 Minute Liters of Oxygen  0 L     4 Minute Oxygen Saturation %  97 %     4 Minute Liters of Oxygen  0 L     5 Minute Oxygen Saturation %  97 %     5 Minute Liters of Oxygen  0 L     6  Minute Oxygen Saturation %  97 %     6 Minute Liters of Oxygen  0 L     2 Minute Post Oxygen Saturation %  98 %     2 Minute Post Liters of Oxygen  0 L        Psychological, QOL, Others - Outcomes: PHQ 2/9: Depression screen [facility] 2/9 01/04/2020 05/18/2019 12/31/2018 10/03/2017 09/16/2017  Decreased Interest 0 1 0 0 0  Down, Depressed, Hopeless 0 0 0 0 0  PHQ - 2 Score 0 1 0 0 0  Altered sleeping 3 0 - 3 -  Tired, decreased energy 1 1 - 1 -  Change in appetite 0 0 - 0 -  Feeling bad or failure about yourself  0 0 - 0 -  Trouble concentrating 0 0 - 0 -  Moving slowly or fidgety/restless 0 0 - 0 -  Suicidal thoughts 0 0 - 0 -  PHQ-9 Score 4 2 - 4 -  Difficult doing work/chores Not difficult at all Not difficult at all - Not difficult at all -    Quality of Life:   Personal Goals: Goals established at orientation with interventions provided to work toward goal. Personal Goals and Risk Factors at Admission - 01/04/20 1250      Core Components/Risk Factors/Patient Goals on Admission    Weight Management  Yes;Weight Loss    Intervention  Weight Management: Develop a combined nutrition and exercise program designed to reach desired caloric intake, while maintaining appropriate intake of nutrient and fiber, sodium and fats, and appropriate energy expenditure required for the weight goal.;Weight Management: Provide education and appropriate resources to help participant work on and attain dietary goals.    Admit Weight  190 lb 3.2 oz (86.3 kg)    Goal Weight: Short  Term  185 lb (83.9 kg)    Goal Weight: Long Term  180 lb (81.6 kg)    Expected Outcomes  Short Term: Continue to assess and modify interventions until short term weight is achieved;Long Term: Adherence to nutrition and physical activity/exercise program aimed toward attainment of established weight goal;Weight Loss: Understanding of general recommendations for a balanced deficit meal plan, which promotes 1-2 lb weight loss per week and includes a negative energy balance of [phone] kcal/d;Understanding recommendations for meals to include 15-35% energy as protein, 25-35% energy from fat, 35-60% energy from carbohydrates, less than 256m of dietary cholesterol, 20-35 gm of total fiber daily;Understanding of distribution of calorie intake throughout the day with the consumption of 4-5 meals/snacks    Improve shortness of breath with ADL's  Yes    Intervention  Provide education, individualized exercise plan and daily activity instruction to help decrease symptoms of SOB with activities of daily living.    Expected Outcomes  Long Term: Be able to perform more ADLs without symptoms or delay the onset of symptoms;Short Term: Improve cardiorespiratory fitness to achieve a reduction of symptoms when performing ADLs    Lipids  Yes    Intervention  Provide education and support for participant on nutrition & aerobic/resistive exercise along with prescribed medications to achieve LDL <77m HDL >404m   Expected Outcomes  Short Term: Participant states understanding of desired cholesterol values and is compliant with medications prescribed. Participant is following exercise prescription and nutrition guidelines.;Long Term: Cholesterol controlled with medications as prescribed, with individualized exercise RX and with personalized nutrition plan. Value goals: LDL < 60m7mDL > 40 mg.        Personal Goals  Discharge: Goals and Risk Factor Review    Row Name 01/18/20 0813 02/23/20 4503           Core  Components/Risk Factors/Patient Goals Review   Personal Goals Review  Weight Management/Obesity;Hypertension;Lipids  Weight Management/Obesity;Hypertension;Lipids      Review  Jessica [name] has been mainatining her weight with small ups and downs. She reports taking all medications as prescribed and maintaining her BP and lipids in acceptable ranges.  Jessica [name] is is doing well in rehab.  She is drinking lots of chocolate milk as a snack and her weight has been up and down.  Her pressures have been good and she continues to check them at home.      Expected Outcomes  Short: Continue to work on weight loss.  Long: Continue to monitor risk factors.  Short: Continue to work on weight loss and reduce snacking  Long: Continue to monitor risk factors.         Exercise Goals and Review: Exercise Goals    Row Name 01/04/20 1230             Exercise Goals   Increase Physical Activity  Yes       Intervention  Provide advice, education, support and counseling about physical activity/exercise needs.;Develop an individualized exercise prescription for aerobic and resistive training based on initial evaluation findings, risk stratification, comorbidities and participant's personal goals.       Expected Outcomes  Short Term: Attend rehab on a regular basis to increase amount of physical activity.;Long Term: Add in home exercise to make exercise part of routine and to increase amount of physical activity.;Long Term: Exercising regularly at least 3-5 days a week.       Increase Strength and Stamina  Yes       Intervention  Provide advice, education, support and counseling about physical activity/exercise needs.;Develop an individualized exercise prescription for aerobic and resistive training based on initial evaluation findings, risk stratification, comorbidities and participant's personal goals.       Expected Outcomes  Short Term: Increase workloads from initial exercise prescription for resistance, speed, and  METs.;Short Term: Perform resistance training exercises routinely during rehab and add in resistance training at home;Long Term: Improve cardiorespiratory fitness, muscular endurance and strength as measured by increased METs and functional capacity (6MWT)       Able to understand and use rate of perceived exertion (RPE) scale  Yes       Intervention  Provide education and explanation on how to use RPE scale       Expected Outcomes  Short Term: Able to use RPE daily in rehab to express subjective intensity level;Long Term:  Able to use RPE to guide intensity level when exercising independently       Able to understand and use Dyspnea scale  Yes       Intervention  Provide education and explanation on how to use Dyspnea scale       Expected Outcomes  Short Term: Able to use Dyspnea scale daily in rehab to express subjective sense of shortness of breath during exertion;Long Term: Able to use Dyspnea scale to guide intensity level when exercising independently       Knowledge and understanding of Target Heart Rate Range (THRR)  Yes       Intervention  Provide education and explanation of THRR including how the numbers were predicted and where they are located for reference       Expected Outcomes  Short Term: Able to state/look  up THRR;Short Term: Able to use daily as guideline for intensity in rehab;Long Term: Able to use THRR to govern intensity when exercising independently       Able to check pulse independently  Yes       Intervention  Provide education and demonstration on how to check pulse in carotid and radial arteries.;Review the importance of being able to check your own pulse for safety during independent exercise       Expected Outcomes  Short Term: Able to explain why pulse checking is important during independent exercise;Long Term: Able to check pulse independently and accurately       Understanding of Exercise Prescription  Yes       Intervention  Provide education, explanation, and  written materials on patient's individual exercise prescription       Expected Outcomes  Short Term: Able to explain program exercise prescription;Long Term: Able to explain home exercise prescription to exercise independently          Exercise Goals Re-Evaluation: Exercise Goals Re-Evaluation    Row Name 01/11/20 0855 01/18/20 0829 02/05/20 0854 02/11/20 0845 02/16/20 1629     Exercise Goal Re-Evaluation   Exercise Goals Review  Able to understand and use rate of perceived exertion (RPE) scale;Able to understand and use Dyspnea scale;Knowledge and understanding of Target Heart Rate Range (THRR);Understanding of Exercise Prescription  Able to understand and use rate of perceived exertion (RPE) scale;Able to understand and use Dyspnea scale;Knowledge and understanding of Target Heart Rate Range (THRR);Understanding of Exercise Prescription  Increase Physical Activity;Increase Strength and Stamina;Able to understand and use rate of perceived exertion (RPE) scale;Able to understand and use Dyspnea scale;Knowledge and understanding of Target Heart Rate Range (THRR);Able to check pulse independently;Understanding of Exercise Prescription  Increase Physical Activity;Increase Strength and Stamina;Able to understand and use rate of perceived exertion (RPE) scale;Able to understand and use Dyspnea scale;Knowledge and understanding of Target Heart Rate Range (THRR);Able to check pulse independently;Understanding of Exercise Prescription  Increase Strength and Stamina;Increase Physical Activity;Understanding of Exercise Prescription   Comments  Reviewed RPE scale, THR and program prescription with pt today.  Pt voiced understanding and was given a copy of goals to take home. Short: Use RPE  daily to regulate intensity. Long: Follow program prescription in THR.  Jessica [name] stated that she feels she is doing well with her exercise except for the eliptical. It is difficult for her exercising on the eliptical with her  mask. He was given a breathable mask to try to see if that helps with breathing while exercising. She continues to progress with intensity and get stronger.  Jessica [name] is attending consistently.  her 02 stays in high 90s with exercise.  Staff will monitor progress.  Updated home exercise with pt today.  Pt plans to walk at home for exercise.  Reviewed THR, pulse, RPE, sign and symptoms, NTG use, and when to call 911 or MD.  Also discussed weather considerations and indoor options.  Pt voiced understanding.  Jessica [name] is doing well in rehab.  She is now up to 3.5 mph on the treadmill.  We will also continue to encourage her to improve METs on XR and continue to monitor her progress.   Expected Outcomes  Short: Use RPE daily to regulate intensity. Long: Follow program prescription in THR.  Short: Try breathable mask with exercise to see if it improves SOB. Long: Follow program prescription in THR.  Short: continue to exercise consistently  Long:  continue to build stamina  Short:  Continue to add in exercise  Long: Contnue to improve stamina.  Short: Improve METs on XR.  Long: Continue to improve stamina.   [place] Name 02/23/20 0818 03/01/20 1520           Exercise Goal Re-Evaluation   Exercise Goals Review  Increase Strength and Stamina;Increase Physical Activity;Understanding of Exercise Prescription  Increase Strength and Stamina;Increase Physical Activity;Understanding of Exercise Prescription      Comments  Jessica [name] is doing well in rehab.  She is not doing well on her own at home. She would like to go back to [facility] for her off days from rehab.  She is feeling better being back in class.  Jessica [name] attends consistenly and works at [employer]. Staff will encourage her to get back to [facility] on days off.      Expected Outcomes  Short; Rejoin [facility]  Long: Continue to improve stamina again.  Short: add back [facility] Long: continue to improve MET level         Nutrition & Weight - Outcomes: Pre Biometrics -  01/04/20 1231      Pre Biometrics   Height  5' 6.75" (1.695 m)    Weight  190 lb 3.2 oz (86.3 kg)    BMI (Calculated)  30.03    Single Leg Stand  6.62 seconds        Nutrition: Nutrition Therapy & Goals - 02/01/20 0901      Nutrition Therapy   Diet  HH, low Na diet    Drug/Food Interactions  --    Protein (specify units)  65g    Fiber  25 grams    Whole Grain Foods  3 servings    Saturated Fats  12 max. grams    Fruits and Vegetables  5 servings/day    Sodium  1.5 grams      Personal Nutrition Goals   Nutrition Goal  ST: 3-day food log LT: maintain wt, increase energy further, manage healthy    Comments  Pt reports choosing small portions, but still eating HH diet and maintaining changes from last time at rehab. Pt is still eating one big meal a day, reviewed that to meet all of her needs is hard in one meal so encouraged pt to follow hunger cues for portion sizes, make sure having at least one good source of protein, and keeping a variety. Reviewed HH eating. Pt with no questions at this time. Will do 3-day food log to ensure meeting needs.      Intervention Plan   Intervention  Prescribe, educate and counsel regarding individualized specific dietary modifications aiming towards targeted core components such as weight, hypertension, lipid management, diabetes, heart failure and other comorbidities.;Nutrition handout(s) given to patient.    Expected Outcomes  Short Term Goal: Understand basic principles of dietary content, such as calories, fat, sodium, cholesterol and nutrients.;Short Term Goal: A plan has been developed with personal nutrition goals set during dietitian appointment.;Long Term Goal: Adherence to prescribed nutrition plan.       Nutrition Discharge: Nutrition Assessments - 01/04/20 1231      MEDFICTS Scores   Pre Score  18       Education Questionnaire Score: Knowledge Questionnaire Score - 01/04/20 1232      Knowledge Questionnaire Score   Pre Score   15/18 Education Focus: COPD,       Goals reviewed with patient; copy given to patient.

## 2020-03-14 ENCOUNTER — Other Ambulatory Visit: Payer: Self-pay | Admitting: *Deleted

## 2020-03-14 DIAGNOSIS — E785 Hyperlipidemia, unspecified: Secondary | ICD-10-CM | POA: Diagnosis not present

## 2020-03-14 DIAGNOSIS — E782 Mixed hyperlipidemia: Secondary | ICD-10-CM | POA: Diagnosis not present

## 2020-03-14 LAB — LIPID PANEL
Chol/HDL Ratio: 3.3 ratio (ref 0.0–4.4)
Cholesterol, Total: 117 mg/dL (ref 100–199)
HDL: 36 mg/dL — ABNORMAL LOW (ref >39)
LDL Chol Calc (NIH): 59 mg/dL (ref 0–99)
Triglycerides: 125 mg/dL (ref 0–149)
VLDL Cholesterol Cal: 22 mg/dL (ref 5–40)

## 2020-03-14 LAB — HEPATIC FUNCTION PANEL
ALT: 13 IU/L (ref 0–32)
AST: 21 IU/L (ref 0–40)
Albumin: 4.2 g/dL (ref 3.8–4.8)
Alkaline Phosphatase: 65 IU/L (ref 48–121)
Bilirubin Total: 0.3 mg/dL (ref 0.0–1.2)
Bilirubin, Direct: 0.08 mg/dL (ref 0.00–0.40)
Total Protein: 7 g/dL (ref 6.0–8.5)

## 2020-03-22 DIAGNOSIS — M1812 Unilateral primary osteoarthritis of first carpometacarpal joint, left hand: Secondary | ICD-10-CM | POA: Diagnosis not present

## 2020-03-22 DIAGNOSIS — M79645 Pain in left finger(s): Secondary | ICD-10-CM | POA: Diagnosis not present

## 2020-04-06 DIAGNOSIS — S46212A Strain of muscle, fascia and tendon of other parts of biceps, left arm, initial encounter: Secondary | ICD-10-CM | POA: Diagnosis not present

## 2020-04-22 ENCOUNTER — Other Ambulatory Visit: Payer: Self-pay

## 2020-04-22 ENCOUNTER — Encounter: Payer: Self-pay | Admitting: Internal Medicine

## 2020-04-22 ENCOUNTER — Ambulatory Visit: Payer: Medicare PPO | Admitting: Internal Medicine

## 2020-04-22 VITALS — BP 124/82 | HR 59 | Temp 98.5°F | Ht 66.0 in | Wt 193.0 lb

## 2020-04-22 DIAGNOSIS — R252 Cramp and spasm: Secondary | ICD-10-CM

## 2020-04-22 LAB — CBC
HCT: 38.1 % (ref 36.0–46.0)
Hemoglobin: 12.4 g/dL (ref 12.0–15.0)
MCHC: 32.6 g/dL (ref 30.0–36.0)
MCV: 87.8 fl (ref 78.0–100.0)
Platelets: 215 10*3/uL (ref 150.0–400.0)
RBC: 4.34 Mil/uL (ref 3.87–5.11)
RDW: 13.2 % (ref 11.5–15.5)
WBC: 5.1 10*3/uL (ref 4.0–10.5)

## 2020-04-22 LAB — VITAMIN B12: Vitamin B-12: 323 pg/mL (ref 211–911)

## 2020-04-22 LAB — COMPREHENSIVE METABOLIC PANEL
ALT: 23 U/L (ref 0–35)
AST: 27 U/L (ref 0–37)
Albumin: 4 g/dL (ref 3.5–5.2)
Alkaline Phosphatase: 56 U/L (ref 39–117)
BUN: 13 mg/dL (ref 6–23)
CO2: 25 mEq/L (ref 19–32)
Calcium: 9.4 mg/dL (ref 8.4–10.5)
Chloride: 104 mEq/L (ref 96–112)
Creatinine, Ser: 0.98 mg/dL (ref 0.40–1.20)
GFR: 67.96 mL/min (ref >60.00)
Glucose, Bld: 92 mg/dL (ref 70–99)
Potassium: 3.8 mEq/L (ref 3.5–5.1)
Sodium: 138 mEq/L (ref 135–145)
Total Bilirubin: 0.4 mg/dL (ref 0.2–1.2)
Total Protein: 7 g/dL (ref 6.0–8.3)

## 2020-04-22 LAB — MAGNESIUM: Magnesium: 1.8 mg/dL (ref 1.5–2.5)

## 2020-04-22 LAB — HEMOGLOBIN A1C: Hgb A1c MFr Bld: 6.1 % (ref 4.6–6.5)

## 2020-04-22 LAB — VITAMIN D 25 HYDROXY (VIT D DEFICIENCY, FRACTURES): VITD: 46.9 ng/mL (ref 30.00–100.00)

## 2020-04-22 LAB — CK: Total CK: 225 U/L — ABNORMAL HIGH (ref 7–177)

## 2020-04-22 NOTE — Patient Instructions (Signed)
We will check the labs today. 

## 2020-04-22 NOTE — Progress Notes (Signed)
   Subjective:   Patient ID: Jessica [name], female    DOB: Feb 04, 1951, 69 y.o.   MRN: [id number]  HPI The patient is a 69 YO female coming in for concerns about muscle cramps/spasms. Has had some in the legs for some time and recently had a bad episode in the right hand. It clawed up and hurt for several minutes before she was able to get it to stop. She denies change in diet although since heart bypass she has cut back on salt. Drinks plenty of fluids including water. Denies recent dieting or change in diet. Weight is stable. Is not on any fluid pill type medications. Is taking repatha since Feb 2021 and previously on praluent. Denies recent medication changes. Has not tried anything for these as she is not sure what to try.   Review of Systems  Constitutional: Negative.   HENT: Negative.   Eyes: Negative.   Respiratory: Negative for cough, chest tightness and shortness of breath.   Cardiovascular: Negative for chest pain, palpitations and leg swelling.  Gastrointestinal: Negative for abdominal distention, abdominal pain, constipation, diarrhea, nausea and vomiting.  Musculoskeletal: Positive for myalgias.  Skin: Negative.   Neurological: Negative.   Psychiatric/Behavioral: Negative.     Objective:  Physical Exam Constitutional:      Appearance: She is well-developed.  HENT:     Head: Normocephalic and atraumatic.  Cardiovascular:     Rate and Rhythm: Normal rate and regular rhythm.  Pulmonary:     Effort: Pulmonary effort is normal. No respiratory distress.     Breath sounds: Normal breath sounds. No wheezing or rales.  Abdominal:     General: Bowel sounds are normal. There is no distension.     Palpations: Abdomen is soft.     Tenderness: There is no abdominal tenderness. There is no rebound.  Musculoskeletal:     Cervical back: Normal range of motion.  Skin:    General: Skin is warm and dry.  Neurological:     Mental Status: She is alert and oriented to person,  place, and time.     Coordination: Coordination normal.     Vitals:   04/22/20 0812  BP: 124/82  Pulse: (!) 59  Temp: 98.5 F (36.9 C)  TempSrc: Oral  SpO2: 97%  Weight: 193 lb (87.5 kg)  Height: 5\' 6"  (1.676 m)    This visit occurred during the SARS-CoV-2 public health emergency.  Safety protocols were in place, including screening questions prior to the visit, additional usage of staff PPE, and extensive cleaning of exam room while observing appropriate contact time as indicated for disinfecting solutions.   Assessment & Plan:

## 2020-04-22 NOTE — Assessment & Plan Note (Signed)
Checking CMP, CBC, B12, vitamin D, CK, magnesium. Adjust as needed.

## 2020-05-06 ENCOUNTER — Telehealth: Payer: Self-pay | Admitting: Pharmacist

## 2020-05-06 NOTE — Telephone Encounter (Signed)
LMOM; patient to call back to discuss issues with Repatha.

## 2020-05-06 NOTE — Telephone Encounter (Signed)
Patient reports runny nose, muscle aches, watery eyes - last repatha dose 14 days ago. 1st dose feb/2021  Doubtful reaction is coming from repatha but will re-assess therapy.   Patient instrcuted to HOLD repatha for 30 days, then re-challenge at current regimens. If symptoms re-appear will try low dose Praluent.

## 2020-05-17 DIAGNOSIS — H04123 Dry eye syndrome of bilateral lacrimal glands: Secondary | ICD-10-CM | POA: Diagnosis not present

## 2020-05-24 ENCOUNTER — Encounter: Payer: Self-pay | Admitting: Family Medicine

## 2020-05-24 ENCOUNTER — Ambulatory Visit: Payer: Medicare PPO | Admitting: Family Medicine

## 2020-05-24 ENCOUNTER — Ambulatory Visit (INDEPENDENT_AMBULATORY_CARE_PROVIDER_SITE_OTHER): Payer: Medicare PPO

## 2020-05-24 ENCOUNTER — Ambulatory Visit: Payer: Self-pay

## 2020-05-24 ENCOUNTER — Other Ambulatory Visit: Payer: Self-pay

## 2020-05-24 VITALS — BP 112/72 | HR 73 | Ht 66.0 in | Wt 193.0 lb

## 2020-05-24 DIAGNOSIS — M5010 Cervical disc disorder with radiculopathy, unspecified cervical region: Secondary | ICD-10-CM

## 2020-05-24 DIAGNOSIS — M542 Cervicalgia: Secondary | ICD-10-CM

## 2020-05-24 DIAGNOSIS — M1812 Unilateral primary osteoarthritis of first carpometacarpal joint, left hand: Secondary | ICD-10-CM | POA: Diagnosis not present

## 2020-05-24 DIAGNOSIS — M25531 Pain in right wrist: Secondary | ICD-10-CM | POA: Diagnosis not present

## 2020-05-24 DIAGNOSIS — M25532 Pain in left wrist: Secondary | ICD-10-CM

## 2020-05-24 DIAGNOSIS — M47812 Spondylosis without myelopathy or radiculopathy, cervical region: Secondary | ICD-10-CM | POA: Diagnosis not present

## 2020-05-24 MED ORDER — GABAPENTIN 100 MG PO CAPS: 200.0000 mg | ORAL_CAPSULE | Freq: Every day | ORAL | 0 refills | Status: DC

## 2020-05-24 NOTE — Assessment & Plan Note (Signed)
Patient is having more what could be potentially a cervical radiculopathy.  Low dose of gabapentin given today.  We discussed the potential though that this could be more shoulder and we will consider the possibility of ultrasound and follow-up.  Today we did not do the ultrasound with it seemed to be more cervical radiculopathy.  Patient has no weakness no decrease in deep tendon reflexes either which makes me feel that patient should do well with conservative therapy.  Patient will follow up again in 6 weeks and will call me sooner if pain seems to worsen.  X-rays of the neck pending

## 2020-05-24 NOTE — Patient Instructions (Addendum)
Compression sleeve Xray neck Gabapentin 200mg  at night In 3-4 weeks if not better send message and will order nerve conduction study See me in 6 weeks otherwise

## 2020-05-24 NOTE — Progress Notes (Signed)
[place] Phone: [phone] Subjective:   [name], am serving as a scribe for Dr. Hulan Saas. This visit occurred during the SARS-CoV-2 public health emergency.  Safety protocols were in place, including screening questions prior to the visit, additional usage of staff PPE, and extensive cleaning of exam room while observing appropriate contact time as indicated for disinfecting solutions.   I'm seeing this patient by the request  of:  Hoyt Koch, MD  CC: Left hand pain and left arm pain  CNO:[id number]  Jessica [name] is a 69 y.o. female coming in with complaint of left hand pain and left arm pain. Last seen on 12/30/2019 for [facility] joint arthritis, left thumb. Patient states that her pain in upper arm has been chronic. Has been seen in ED and UC for pain.   Continues to have issues with the left CMC joint.  Patient has had an injection previously and did help some but seems to be worsening again.  States that now is can a more of a dull, throbbing aching pain.  Sometimes can be painful.     Past Medical History:  Diagnosis Date  . Allergy   . Breast cancer ([place]) 2000  . Cancer [facility])    breast  . Colon polyp   . Coronary artery disease   . GERD (gastroesophageal reflux disease)   . History of blood transfusion   . Hyperlipidemia   . Migraine    history of migrarines, none as an adult  . Personal history of radiation therapy 2000   Past Surgical History:  Procedure Laterality Date  . ABDOMINAL HYSTERECTOMY    . BREAST LUMPECTOMY Right 2000  . BREAST SURGERY Right    calcifaction removed  . BUNIONECTOMY Bilateral   . COLONOSCOPY W/ POLYPECTOMY    . CORONARY ARTERY BYPASS GRAFT N/A 01/05/2019   Procedure: CORONARY ARTERY BYPASS GRAFTING (CABG), using right leg saphenous endoscopic and open vein harvest, exploration left leg;  Surgeon: Gaye Pollack, MD;  Location: [place] OR;   Service: Open Heart Surgery;  Laterality: N/A;  . EYE SURGERY Bilateral    cataract  . hemorrhoid    . LEFT HEART CATH AND CORONARY ANGIOGRAPHY N/A 01/01/2019   Procedure: LEFT HEART CATH AND CORONARY ANGIOGRAPHY;  Surgeon: Jettie Booze, MD;  Location: [place] [facility];  Service: Cardiovascular;  Laterality: N/A;  . STERNAL WIRES REMOVAL N/A 05/11/2019   Procedure: STERNAL WIRES REMOVAL;  Surgeon: Gaye Pollack, MD;  Location: [place];  Service: Thoracic;  Laterality: N/A;  . TEE WITHOUT CARDIOVERSION N/A 01/05/2019   Procedure: TRANSESOPHAGEAL ECHOCARDIOGRAM (TEE);  Surgeon: Gaye Pollack, MD;  Location: [place];  Service: Open Heart Surgery;  Laterality: N/A;   Social History   Socioeconomic History  . Marital status: Married    Spouse name: Not on file  . Number of children: Not on file  . Years of education: Not on file  . Highest education level: Master's degree (e.g., MA, MS, MEng, MEd, MSW, MBA)  Occupational History  . Not on file  Tobacco Use  . Smoking status: Never Smoker  . Smokeless tobacco: Never Used  Vaping Use  . Vaping Use: Never used  Substance and Sexual Activity  . Alcohol use: No  . Drug use: No  . Sexual activity: Yes  Other Topics Concern  . Not on file  Social History Narrative  . Not on file  Social Determinants of Health   Financial Resource Strain:   . Difficulty of Paying Living Expenses:   Food Insecurity:   . Worried About Charity fundraiser in the Last Year:   . Arboriculturist in the Last Year:   Transportation Needs:   . Film/video editor (Medical):   [name] Lack of Transportation (Non-Medical):   Physical Activity:   . Days of Exercise per Week:   . Minutes of Exercise per Session:   Stress:   . Feeling of Stress :   Social Connections:   . Frequency of Communication with Friends and Family:   . Frequency of Social Gatherings with Friends and Family:   . Attends Religious Services:   . Active Member of Clubs or  Organizations:   . Attends Archivist Meetings:   [name] Marital Status:    Allergies  Allergen Reactions  . Codeine Nausea And Vomiting  . Sulfa Antibiotics Swelling  . Sulfacetamide Swelling   Family History  Problem Relation Age of Onset  . Heart disease Brother   . Asthma Paternal Aunt   . Diabetes Paternal Aunt   . Stroke Paternal Uncle   . Kidney disease Paternal Uncle      Current Outpatient Medications (Cardiovascular):  [name]  Evolocumab (REPATHA SURECLICK) 272 MG/ML SOAJ, Inject 140 mg into the skin every 14 (fourteen) days. .  metoprolol tartrate (LOPRESSOR) 25 MG tablet, Take 0.5 tablets (12.5 mg total) by mouth daily.  Current Outpatient Medications (Respiratory):  .  loratadine (CLARITIN) 10 MG tablet, Take 10 mg by mouth every evening.   Current Outpatient Medications (Analgesics):  .  aspirin EC 325 MG EC tablet, Take 1 tablet (325 mg total) by mouth daily. (Patient taking differently: Take 325 mg by mouth at bedtime. )   Current Outpatient Medications (Other):  [name]  Ascorbic Acid (VITAMIN C) 1000 MG tablet, Take 2,000 mg by mouth daily. .  Cholecalciferol (VITAMIN D-3) 125 MCG (5000 UT) TABS, Take 5,000 Units by mouth daily. [name]  gabapentin (NEURONTIN) 100 MG capsule, Take 2 capsules (200 mg total) by mouth at bedtime. .  hydrocortisone (ANUSOL-HC) 2.5 % rectal cream, Place 1 application rectally 2 (two) times daily. .  Multiple Vitamin (MULTIVITAMIN WITH MINERALS) TABS tablet, Take 1 tablet by mouth daily.   Reviewed prior external information including notes and imaging from  primary care provider As well as notes that were available from care everywhere and other healthcare systems.  Past medical history, social, surgical and family history all reviewed in electronic medical record.  No pertanent information unless stated regarding to the chief complaint.   Review of Systems:  No headache, visual changes, nausea, vomiting, diarrhea, constipation,  dizziness, abdominal pain, skin rash, fevers, chills, night sweats, weight loss, swollen lymph nodes, body aches, joint swelling, chest pain, shortness of breath, mood changes. POSITIVE muscle aches  Objective  Blood pressure 112/72, pulse 73, height 5\' 6"  (1.676 m), weight 193 lb (87.5 kg), SpO2 97 %.   General: No apparent distress alert and oriented x3 mood and affect normal, dressed appropriately.  HEENT: Pupils equal, extraocular movements intact  Respiratory: Patient's speak in full sentences and does not appear short of breath  Cardiovascular: No lower extremity edema, non tender, no erythema  Gait very minorly antalgic MSK:   Patient's neck exam does have some loss of lordosis.  Some tenderness to palpation over the left side of the neck.  Mild positive radicular symptoms.  Patient's left shoulder does  have some very mild impingement noted.  Patient does have some tightness with the bicep tendon but no abnormality noted.  No weakness noted at the moment.    Impression and Recommendations:     The above documentation has been reviewed and is accurate and complete Lyndal Pulley, DO       Note: This dictation was prepared with Dragon dictation along with smaller phrase technology. Any transcriptional errors that result from this process are unintentional.

## 2020-05-24 NOTE — Assessment & Plan Note (Signed)
Patient chronic problem overall, will be long term.  Patient could do bracing potentially.  Differential includes the possibility of a cervical radiculopathy as well.  Started on very low dose of gabapentin and warned of potential side effects.  Discussed icing regimen and home exercises.  Which activities to doing which wants to avoid.  Follow-up again 6 to 8 weeks

## 2020-05-31 ENCOUNTER — Ambulatory Visit (HOSPITAL_COMMUNITY)
Admission: RE | Admit: 2020-05-31 | Discharge: 2020-05-31 | Disposition: A | Discharge status: Home / Self Care | Payer: Medicare PPO | Source: Ambulatory Visit | Attending: Cardiology | Admitting: Cardiology

## 2020-05-31 ENCOUNTER — Other Ambulatory Visit: Payer: Self-pay

## 2020-05-31 ENCOUNTER — Encounter: Payer: Self-pay | Admitting: Internal Medicine

## 2020-05-31 ENCOUNTER — Ambulatory Visit: Payer: Medicare PPO | Admitting: Internal Medicine

## 2020-05-31 VITALS — BP 150/86 | HR 60 | Temp 98.7°F | Ht 66.0 in | Wt 196.0 lb

## 2020-05-31 DIAGNOSIS — M79601 Pain in right arm: Secondary | ICD-10-CM

## 2020-05-31 DIAGNOSIS — M7989 Other specified soft tissue disorders: Secondary | ICD-10-CM | POA: Insufficient documentation

## 2020-05-31 MED ORDER — TRAMADOL HCL 50 MG PO TABS: 50.0000 mg | ORAL_TABLET | Freq: Four times a day (QID) | ORAL | 0 refills | Status: DC | PRN

## 2020-05-31 NOTE — Progress Notes (Signed)
Subjective:    Patient ID: Jessica [name], female    DOB: November 21, 1950, 69 y.o.   MRN: [id number]  HPI  Here with c/o rather sudden onset it seems in the last 3 days of right shoulder and arm pain and swelling, mild to mod, constant, dull and pressure like, worse to try to lift the arm especially overhead or lie on the right side at night, better to not do those things, and tried tylenol and heating pad but just not getting better.  No fever, trauma, or hx of gout.  No repetitive injury activity.  No hx of dvt/pe.  Pt denies chest pain, increased sob or doe, wheezing, orthopnea, PND, increased LE swelling, palpitations, dizziness or syncope.  Pt denies new neurological symptoms such as new headache, or facial or extremity weakness or numbness   Pt denies polydipsia, polyuria Past Medical History:  Diagnosis Date  . Allergy   . Breast cancer ([place]) 2000  . Cancer [facility])    breast  . Colon polyp   . Coronary artery disease   . GERD (gastroesophageal reflux disease)   . History of blood transfusion   . Hyperlipidemia   . Migraine    history of migrarines, none as an adult  . Personal history of radiation therapy 2000   Past Surgical History:  Procedure Laterality Date  . ABDOMINAL HYSTERECTOMY    . BREAST LUMPECTOMY Right 2000  . BREAST SURGERY Right    calcifaction removed  . BUNIONECTOMY Bilateral   . COLONOSCOPY W/ POLYPECTOMY    . CORONARY ARTERY BYPASS GRAFT N/A 01/05/2019   Procedure: CORONARY ARTERY BYPASS GRAFTING (CABG), using right leg saphenous endoscopic and open vein harvest, exploration left leg;  Surgeon: Gaye Pollack, MD;  Location: [place] OR;  Service: Open Heart Surgery;  Laterality: N/A;  . EYE SURGERY Bilateral    cataract  . hemorrhoid    . LEFT HEART CATH AND CORONARY ANGIOGRAPHY N/A 01/01/2019   Procedure: LEFT HEART CATH AND CORONARY ANGIOGRAPHY;  Surgeon: Jettie Booze, MD;  Location: [place] CV LAB;  Service: Cardiovascular;  Laterality: N/A;  .  STERNAL WIRES REMOVAL N/A 05/11/2019   Procedure: STERNAL WIRES REMOVAL;  Surgeon: Gaye Pollack, MD;  Location: [place];  Service: Thoracic;  Laterality: N/A;  . TEE WITHOUT CARDIOVERSION N/A 01/05/2019   Procedure: TRANSESOPHAGEAL ECHOCARDIOGRAM (TEE);  Surgeon: Gaye Pollack, MD;  Location: [place];  Service: Open Heart Surgery;  Laterality: N/A;    reports that she has never smoked. She has never used smokeless tobacco. She reports that she does not drink alcohol and does not use drugs. family history includes Asthma in her paternal aunt; Diabetes in her paternal aunt; Heart disease in her brother; Kidney disease in her paternal uncle; Stroke in her paternal uncle. Allergies  Allergen Reactions  . Codeine Nausea And Vomiting  . Sulfa Antibiotics Swelling  . Sulfacetamide Swelling   Current Outpatient Medications on File Prior to Visit  Medication Sig Dispense Refill  . Ascorbic Acid (VITAMIN C) 1000 MG tablet Take 2,000 mg by mouth daily.    [name] aspirin EC 325 MG EC tablet Take 1 tablet (325 mg total) by mouth daily. (Patient taking differently: Take 325 mg by mouth at bedtime. )  0  . Cholecalciferol (VITAMIN D-3) 125 MCG (5000 UT) TABS Take 5,000 Units by mouth daily.    . hydrocortisone (ANUSOL-HC) 2.5 % rectal cream Place 1 application rectally 2 (two) times daily. 30 g 6  .  loratadine (CLARITIN) 10 MG tablet Take 10 mg by mouth every evening.     . metoprolol tartrate (LOPRESSOR) 25 MG tablet Take 0.5 tablets (12.5 mg total) by mouth daily. 30 tablet 11  . Multiple Vitamin (MULTIVITAMIN WITH MINERALS) TABS tablet Take 1 tablet by mouth daily.     No current facility-administered medications on file prior to visit.   Review of Systems All otherwise neg per pt    Objective:   Physical Exam BP (!) 150/86 (BP Location: Left Arm, Patient Position: Sitting, Cuff Size: Large)   Pulse 60   Temp 98.7 F (37.1 C) (Oral)   Ht 5\' 6"  (1.676 m)   Wt 196 lb (88.9 kg)   SpO2 98%   BMI 31.64  kg/m  VS noted,  Constitutional: Pt appears in NAD HENT: Head: NCAT.  Right Ear: External ear normal.  Left Ear: External ear normal.  Eyes: . Pupils are equal, round, and reactive to light. Conjunctivae and EOM are normal Nose: without d/c or deformity Neck: Neck supple. Gross normal ROM Cardiovascular: Normal rate and regular rhythm.   Pulmonary/Chest: Effort normal and breath sounds without rales or wheezing.  RUE with diffuse mild to mod swelling to the shoulder and arm to the hand with tenderness at the bursa aspect of the right shoudler Neurological: Pt is alert. At baseline orientation, motor grossly intact Skin: Skin is warm. No rashes, other new lesions, no LE edema Psychiatric: Pt behavior is normal without agitation  All otherwise neg per pt Lab Results  Component Value Date   WBC 5.1 04/22/2020   HGB 12.4 04/22/2020   HCT 38.1 04/22/2020   PLT 215.0 04/22/2020   GLUCOSE 92 04/22/2020   CHOL 117 03/14/2020   TRIG 125 03/14/2020   HDL 36 (L) 03/14/2020   LDLCALC 59 03/14/2020   ALT 23 04/22/2020   AST 27 04/22/2020   NA 138 04/22/2020   K 3.8 04/22/2020   CL 104 04/22/2020   CREATININE 0.98 04/22/2020   BUN 13 04/22/2020   CO2 25 04/22/2020   TSH 1.94 06/16/2019   INR 1.5 (H) 01/05/2019   HGBA1C 6.1 04/22/2020      Assessment & Plan:

## 2020-05-31 NOTE — Progress Notes (Signed)
Upper extremity venous has been completed.   Preliminary results in CV Proc.   Abram Sander 05/31/2020 3:02 PM   has been completed.   Preliminary results in CV Proc.   Abram Sander 05/31/2020 3:02 PM

## 2020-05-31 NOTE — Patient Instructions (Signed)
Please take all new medication as prescribed - the pain medication as needed  You will be contacted regarding the referral for: vein test for the right arm - to see [facility] now  You will be contacted regarding the referral for: Sports Medicine  Please continue all other medications as before, and refills have been done if requested.  Please have the pharmacy call with any other refills you may need.  Please keep your appointments with your specialists as you may have planned

## 2020-06-01 ENCOUNTER — Encounter: Payer: Self-pay | Admitting: Internal Medicine

## 2020-06-01 DIAGNOSIS — M19041 Primary osteoarthritis, right hand: Secondary | ICD-10-CM | POA: Insufficient documentation

## 2020-06-01 DIAGNOSIS — G8929 Other chronic pain: Secondary | ICD-10-CM | POA: Diagnosis not present

## 2020-06-01 DIAGNOSIS — M7989 Other specified soft tissue disorders: Secondary | ICD-10-CM | POA: Diagnosis not present

## 2020-06-01 DIAGNOSIS — M25512 Pain in left shoulder: Secondary | ICD-10-CM | POA: Insufficient documentation

## 2020-06-01 DIAGNOSIS — M25511 Pain in right shoulder: Secondary | ICD-10-CM | POA: Diagnosis not present

## 2020-06-01 DIAGNOSIS — M180 Bilateral primary osteoarthritis of first carpometacarpal joints: Secondary | ICD-10-CM | POA: Diagnosis not present

## 2020-06-03 ENCOUNTER — Encounter: Payer: Self-pay | Admitting: Family Medicine

## 2020-06-03 ENCOUNTER — Ambulatory Visit: Payer: Self-pay

## 2020-06-03 ENCOUNTER — Ambulatory Visit: Payer: Medicare PPO | Admitting: Family Medicine

## 2020-06-03 ENCOUNTER — Other Ambulatory Visit: Payer: Self-pay

## 2020-06-03 ENCOUNTER — Ambulatory Visit (INDEPENDENT_AMBULATORY_CARE_PROVIDER_SITE_OTHER)
Admission: RE | Admit: 2020-06-03 | Discharge: 2020-06-03 | Disposition: A | Discharge status: Home / Self Care | Payer: Medicare PPO | Source: Ambulatory Visit | Attending: Family Medicine | Admitting: Family Medicine

## 2020-06-03 VITALS — BP 120/84 | HR 63 | Ht 66.0 in | Wt 196.0 lb

## 2020-06-03 DIAGNOSIS — M25511 Pain in right shoulder: Secondary | ICD-10-CM

## 2020-06-03 DIAGNOSIS — M25512 Pain in left shoulder: Secondary | ICD-10-CM | POA: Diagnosis not present

## 2020-06-03 DIAGNOSIS — M7989 Other specified soft tissue disorders: Secondary | ICD-10-CM | POA: Diagnosis not present

## 2020-06-03 DIAGNOSIS — M5412 Radiculopathy, cervical region: Secondary | ICD-10-CM | POA: Diagnosis not present

## 2020-06-03 DIAGNOSIS — M5010 Cervical disc disorder with radiculopathy, unspecified cervical region: Secondary | ICD-10-CM

## 2020-06-03 DIAGNOSIS — G8929 Other chronic pain: Secondary | ICD-10-CM

## 2020-06-03 NOTE — Progress Notes (Signed)
I, [name], LAT, ATC, am serving as scribe for Dr. Lynne Leader.  Jessica [name] is a 69 y.o. female who presents to [place] at [facility] today for R arm pain/swelling.  She was last seen by Dr. Tamala Julian on 05/24/20 for her L hand/arm and was prescribed Gabapentin.  Her symptoms were thought to mainly be due to cervical radiculopathy.  She then saw Dr. Jenny Reichmann on 05/31/20 for her R arm and was referred for a venous doppler US that was negative for DVT.  She then saw Dr. Fredna Dow on 06/01/20 and was referred to OT for her B hand pain/swelling.  She states that she has been having issues in her B arms since 2019 and wants to get to the root cause of her issues.  Today she is having the majority of her pain in her L upper arm.  She denies any numbness/tingling in either UE.  Diagnostic testing: B hand XR- 06/01/20; UE venous doppler US- 05/31/20; C-spine XR- 05/24/20 Pertinent review of systems: No fevers or chills  Relevant historical information: Coronary artery disease with CABG.  History of breast cancer.   Exam:  BP 120/84 (BP Location: Left Arm, Patient Position: Sitting, Cuff Size: Normal)   Pulse 63   Ht 5\' 6"  (1.676 m)   Wt 196 lb (88.9 kg)   SpO2 98%   BMI 31.64 kg/m  General: Well Developed, well nourished, and in no acute distress.   MSK:  C-spine normal-appearing Nontender midline. Tender palpation bilateral cervical paraspinal musculature and trapezius. Decreased cervical motion. Negative Spurling's test. Strength reflexes and sensation are intact upper extremities.  Left shoulder normal-appearing Not particular tender palpation. Range of motion abduction 120 degrees.  Internal rotation to lumbar spine.  Normal external rotation. Strength intact within limits of motion to abduction external and internal rotation. Mildly positive Hawkins and Neer's test. Negative Yergason's and speeds test.  Right shoulder: Normal-appearing Not tender. Range of  motion abduction 120 degrees.  Internal rotation lumbar spine.  Normal external rotation. Strength intact. Mildly positive Hawkins and Neer's test.   Negative Yergason's and speeds test.  Pulses cap refill and sensation are intact distally.  Lab and Radiology Results EXAM: CERVICAL SPINE - 2-3 VIEW  COMPARISON:  None.  FINDINGS: There is no prevertebral soft tissue swelling. Cervical vertebral body heights and alignment are maintained. There is multilevel disc space narrowing with endplate osteophytes, greatest at C5-C6 and C6-C7. Facet and uncovertebral hypertrophy are present. Carotid calcifications are noted.  IMPRESSION: Multilevel degenerative changes, greatest at C5-C6 and is is C6-C7.   Electronically Signed   By: Macy Mis M.D.   On: 05/25/2020 09:36 I, Lynne Leader, personally (independently) visualized and performed the interpretation of the images attached in this note.  Diagnostic Limited MSK Ultrasound of: Left shoulder Biceps tendon intact normal-appearing Subscapularis no significant tear or retraction. Supraspinatus tendon no significant full-thickness tear or retraction.  Moderate increased subacromial bursa. Infraspinatus tendon normal. AC joint degenerative narrowing. Impression: Tatar cuff tendinitis with bursitis no full-thickness tear.  X-ray images shoulders bilaterally ordered today and will be completed later today or in the near future at an outside location.    Assessment and Plan: 69 y.o. female with bilateral arm pain.  Likely multifactorial.  Patient has had multiple visits for this recently with several different providers. I believe that she has more than 1 cause of her pain.  I think she probably does have cervical radiculopathy causing some of her arm pain as  well as shoulder etiology causing the upper arm pain likely rotator cuff tendinitis and certainly does have some hand DJD presence of her hand pain as well.  Plan for x-ray  of bilateral shoulders today to further characterize potential shoulder pain.  We will proceed with cervical spine MRI.  She has had pain now ongoing since 2019 with multiple different visits and treatment trials.  At this point is reasonable proceed with cervical MRI to further evaluate for potential cervical radiculopathy.  Additionally we will proceed with physical therapy for shoulder pain which should be helpful.  Recheck following MRI or in 4 weeks.    Orders Placed This Encounter  Procedures  . [place] LIMITED JOINT SPACE STRUCTURES UP BILAT(NO LINKED CHARGES)    Order Specific Question:   Reason for Exam (SYMPTOM  OR DIAGNOSIS REQUIRED)    Answer:   L shoulder pain    Order Specific Question:   Preferred imaging location?    Answer:   [facility]  . DG Shoulder Right    Standing Status:   Future    Number of Occurrences:   1    Standing Expiration Date:   06/03/2021    Order Specific Question:   Reason for Exam (SYMPTOM  OR DIAGNOSIS REQUIRED)    Answer:   eval shouder pain    Order Specific Question:   Preferred imaging location?    Answer:   [facility]    Order Specific Question:   Radiology Contrast Protocol - do NOT remove file path    Answer:   \\[email or web address].[email or web address]  . DG Shoulder Left    Standing Status:   Future    Number of Occurrences:   1    Standing Expiration Date:   06/03/2021    Order Specific Question:   Reason for Exam (SYMPTOM  OR DIAGNOSIS REQUIRED)    Answer:   eval shoudler pain    Order Specific Question:   Preferred imaging location?    Answer:   [facility]    Order Specific Question:   Radiology Contrast Protocol - do NOT remove file path    Answer:   \\[email or web address]\[email or web address].pdf  . MR CERVICAL SPINE WO CONTRAST    Standing Status:   Future    Standing Expiration Date:   06/03/2021    Order Specific Question:   What is the patient's sedation requirement?     Answer:   No Sedation    Order Specific Question:   Does the patient have a pacemaker or implanted devices?    Answer:   No    Order Specific Question:   Preferred imaging location?    Answer:   [facility] (table limit-350lbs)    Order Specific Question:   Radiology Contrast Protocol - do NOT remove file path    Answer:   \\[email or web address]\Radiant\[email or web address]  . Ambulatory referral to Physical Therapy    Referral Priority:   Routine    Referral Type:   Physical Medicine    Referral Reason:   Specialty Services Required    Requested Specialty:   Physical Therapy   No orders of the defined types were placed in this encounter.    Discussed warning signs or symptoms. Please see discharge instructions. Patient expresses understanding.   The above documentation has been reviewed and is accurate and complete Lynne Leader, M.D.

## 2020-06-03 NOTE — Patient Instructions (Addendum)
Thank you for coming in today. Plan for xray today.  Talk to the front desk about options.  Plan for MRI cervical spine soon.  Plan also for physical therapy.  Recheck with me after the C-spine MRI or in 4 weeks.   Try tylenol for pain.

## 2020-06-05 ENCOUNTER — Encounter: Payer: Self-pay | Admitting: Internal Medicine

## 2020-06-05 DIAGNOSIS — M79601 Pain in right arm: Secondary | ICD-10-CM | POA: Insufficient documentation

## 2020-06-05 DIAGNOSIS — M7989 Other specified soft tissue disorders: Secondary | ICD-10-CM | POA: Insufficient documentation

## 2020-06-05 NOTE — Assessment & Plan Note (Addendum)
Cant r/o dvt - for ruE venous doppler, but suspect probable severe bursitis - also for tramadol prn, and f/u with sports medicine if neg for dvt  I spent 21 minutes in preparing to see the patient by review of recent labs, imaging and procedures, obtaining and reviewing separately obtained history, communicating with the patient and family or caregiver, ordering medications, tests or procedures, and documenting clinical information in the EHR including the differential Dx, treatment, and any further evaluation and other management of rue pain and swelling

## 2020-06-06 ENCOUNTER — Encounter: Payer: Self-pay | Admitting: Family Medicine

## 2020-06-06 ENCOUNTER — Telehealth: Payer: Self-pay | Admitting: *Deleted

## 2020-06-06 NOTE — Telephone Encounter (Signed)
Pt called stating that she was seen on 8/13 and her R shoulder is still very painful. Pt denies any numbness/tingling she states that it just hurts.

## 2020-06-07 NOTE — Progress Notes (Signed)
Right shoulder has some cystic changes in the humeral head which probably are not causing pain.  If needed MRI in the future will see this better.

## 2020-06-07 NOTE — Progress Notes (Signed)
Left shoulder looks mostly normal to radiology.

## 2020-06-07 NOTE — Telephone Encounter (Signed)
Dr. Corey responded to pt via MyChart ?

## 2020-06-07 NOTE — Telephone Encounter (Signed)
We will try to get a better understanding of why you are hurting by using an MRI.  Also tried some medicines to help but not hurt as much which do not seem to be helping.  Do need stronger pain medicine like hydrocodone?

## 2020-06-22 ENCOUNTER — Ambulatory Visit: Payer: Medicare PPO | Admitting: Family Medicine

## 2020-06-22 ENCOUNTER — Encounter: Payer: Self-pay | Admitting: Family Medicine

## 2020-06-22 ENCOUNTER — Ambulatory Visit: Payer: Self-pay

## 2020-06-22 ENCOUNTER — Other Ambulatory Visit: Payer: Self-pay

## 2020-06-22 VITALS — BP 122/84 | HR 80 | Ht 66.0 in | Wt 194.0 lb

## 2020-06-22 DIAGNOSIS — M25512 Pain in left shoulder: Secondary | ICD-10-CM | POA: Diagnosis not present

## 2020-06-22 DIAGNOSIS — G8929 Other chronic pain: Secondary | ICD-10-CM | POA: Diagnosis not present

## 2020-06-22 NOTE — Progress Notes (Signed)
Jessica [name] is a 69 y.o. female who presents to [facility] today for f/u of B shoulder/arm pain and intermittent swelling.  She was last seen by Dr. Georgina Snell on 06/03/20 and was referred to PT at [facility] and was referred for a c-spine MRI.  Since her last visit, pt reports pain occurs when moving arm away from body. Dull pain over bicep. Using heat and taking Tylenol.   Patient last visit with Dr. Multifactorial pain rotator cuff and cervical radiculopathy.  She notes the pain radiating past the level of her elbow has diminished considerably.  MRI was ordered but not been scheduled yet of her cervical spine.  Diagnostic testing: B shoulder XR- 06/03/20; R UE venous doppler US- 05/31/20; C-spine XR- 05/24/20   Pertinent review of systems: No fevers or chills  Relevant historical information: CABG.  History of breast cancer.   Exam:  BP 122/84   Pulse 80   Ht 5\' 6"  (1.676 m)   Wt 194 lb (88 kg)   SpO2 98%   BMI 31.31 kg/m  General: Well Developed, well nourished, and in no acute distress.   MSK: Left shoulder normal-appearing Range of motion abduction 100 degrees external rotation full internal rotation lumbar spine. Pain with abduction arc. Strength 4/5 abduction.    Lab and Radiology Results  Procedure: Real-time Ultrasound Guided Injection of left shoulder subacromial bursa Device: Philips Affiniti 50G Images permanently stored and available for review in PACS Verbal informed consent obtained.  Discussed risks and benefits of procedure. Warned about infection bleeding damage to structures skin hypopigmentation and fat atrophy among others. Patient expresses understanding and agreement Time-out conducted.   Noted no overlying erythema, induration, or other signs of local infection.   Skin prepped in a sterile fashion.   Local anesthesia: Topical Ethyl chloride.   With sterile technique and under real time  ultrasound guidance:  40 mg of Kenalog and 2 mL of Marcaine injected easily.   Completed without difficulty   Pain immediately resolved suggesting accurate placement of the medication.   Advised to call if fevers/chills, erythema, induration, drainage, or persistent bleeding.   Images permanently stored and available for review in the ultrasound unit.  Impression: Technically successful ultrasound guided injection.      Assessment and Plan: 69 y.o. female with left shoulder pain.  Arm pain is multifactorial.  However the majority of her pain now I think is due to rotator cuff tendinopathy.  She had significant improvement immediately following subacromial injection indicating that the majority of her pain is rotator cuff related.  Based on her great response we will delay cervical MRI or cancel it.  Plan to proceed with physical therapy as already ordered for her shoulder and recheck back in about a month.  May consider proceeding with cervical MRI or even shoulder MRI if needed however doubtful these will be necessary.    Orders Placed This Encounter  Procedures  . [place] LIMITED JOINT SPACE STRUCTURES UP LEFT(NO LINKED CHARGES)    Order Specific Question:   Reason for Exam (SYMPTOM  OR DIAGNOSIS REQUIRED)    Answer:   left shoulder pain    Order Specific Question:   Preferred imaging location?    Answer:   [facility]   No orders of the defined types were placed in this encounter.    Discussed warning signs or symptoms. Please see discharge instructions. Patient expresses understanding.   The above documentation  has been reviewed and is accurate and complete Lynne Leader, M.D.

## 2020-06-22 NOTE — Patient Instructions (Addendum)
Thank you for coming in today. Plan to proceed to physical therapy.  Based on your good response to the injection in clinic I think we can hold off on the cervical spine MRI for now.   Cancel your appointment with Dr Tamala Julian on Sep 20.  Schedule with me or Dr Tamala Julian for about 1 month from now.    Shoulder Impingement Syndrome  Shoulder impingement syndrome is a condition that causes pain when connective tissues (tendons) surrounding the shoulder joint become pinched. These tendons are part of the group of muscles and tissues that help to stabilize the shoulder (rotator cuff). Beneath the rotator cuff is a fluid-filled sac (bursa) that allows the muscles and tendons to glide smoothly. The bursa may become swollen or irritated (bursitis). Bursitis, swelling in the rotator cuff tendons, or both conditions can decrease how much space is under a bone in the shoulder joint (acromion), resulting in impingement. What are the causes? Shoulder impingement syndrome may be caused by bursitis or swelling of the rotator cuff tendons, which may result from:  Repetitive overhead arm movements.  Falling onto the shoulder.  Weakness in the shoulder muscles. What increases the risk? You may be more likely to develop this condition if you:  Play sports that involve throwing, such as baseball.  Participate in sports such as tennis, volleyball, and swimming.  Work as a Curator, Games developer, or Architect. Some people are also more likely to develop impingement syndrome because of the shape of their acromion bone. What are the signs or symptoms? The main symptom of this condition is pain on the front or side of the shoulder. The pain may:  Get worse when lifting or raising the arm.  Get worse at night.  Wake you up from sleeping.  Feel sharp when the shoulder is moved and then fade to an ache. Other symptoms may include:  Tenderness.  Stiffness.  Inability to raise the arm above shoulder level or  behind the body.  Weakness. How is this diagnosed? This condition may be diagnosed based on:  Your symptoms and medical history.  A physical exam.  Imaging tests, such as: ? X-rays. ? MRI. ? Ultrasound. How is this treated? This condition may be treated by:  Resting your shoulder and avoiding all activities that cause pain or put stress on the shoulder.  Icing your shoulder.  NSAIDs to help reduce pain and swelling.  One or more injections of medicines to numb the area and reduce inflammation.  Physical therapy.  Surgery. This may be needed if nonsurgical treatments have not helped. Surgery may involve repairing the rotator cuff, reshaping the acromion, or removing the bursa. Follow these instructions at home: Managing pain, stiffness, and swelling   If directed, put ice on the injured area. ? Put ice in a plastic bag. ? Place a towel between your skin and the bag. ? Leave the ice on for 20 minutes, 2-3 times a day. Activity  Rest and return to your normal activities as told by your health care provider. Ask your health care provider what activities are safe for you.  Do exercises as told by your health care provider. General instructions  Do not use any products that contain nicotine or tobacco, such as cigarettes, e-cigarettes, and chewing tobacco. These can delay healing. If you need help quitting, ask your health care provider.  Ask your health care provider when it is safe for you to drive.  Take over-the-counter and prescription medicines only as told by your  health care provider.  Keep all follow-up visits as told by your health care provider. This is important. How is this prevented?  Give your body time to rest between periods of activity.  Be safe and responsible while being active. This will help you avoid falls.  Maintain physical fitness, including strength and flexibility. Contact a health care provider if:  Your symptoms have not improved  after 1-2 months of treatment and rest.  You cannot lift your arm away from your body. Summary  Shoulder impingement syndrome is a condition that causes pain when connective tissues (tendons) surrounding the shoulder joint become pinched.  The main symptom of this condition is pain on the front or side of the shoulder.  This condition is usually treated with rest, ice, and pain medicines as needed. This information is not intended to replace advice given to you by your health care provider. Make sure you discuss any questions you have with your health care provider. Document Revised: 01/30/2019 Document Reviewed: 04/02/2018 Elsevier Patient Education  2020 [employer].

## 2020-06-24 ENCOUNTER — Ambulatory Visit: Payer: Medicare PPO | Admitting: Cardiovascular Disease

## 2020-06-24 ENCOUNTER — Encounter: Payer: Self-pay | Admitting: Cardiovascular Disease

## 2020-06-24 ENCOUNTER — Other Ambulatory Visit: Payer: Self-pay

## 2020-06-24 DIAGNOSIS — I739 Peripheral vascular disease, unspecified: Secondary | ICD-10-CM

## 2020-06-24 DIAGNOSIS — E782 Mixed hyperlipidemia: Secondary | ICD-10-CM | POA: Diagnosis not present

## 2020-06-24 DIAGNOSIS — Z951 Presence of aortocoronary bypass graft: Secondary | ICD-10-CM

## 2020-06-24 LAB — LIPID PANEL
Chol/HDL Ratio: 5.8 ratio — ABNORMAL HIGH (ref 0.0–4.4)
Cholesterol, Total: 216 mg/dL — ABNORMAL HIGH (ref 100–199)
HDL: 37 mg/dL — ABNORMAL LOW (ref >39)
LDL Chol Calc (NIH): 162 mg/dL — ABNORMAL HIGH (ref 0–99)
Triglycerides: 95 mg/dL (ref 0–149)
VLDL Cholesterol Cal: 17 mg/dL (ref 5–40)

## 2020-06-24 LAB — HEPATIC FUNCTION PANEL
ALT: 20 IU/L (ref 0–32)
AST: 29 IU/L (ref 0–40)
Albumin: 4.6 g/dL (ref 3.8–4.8)
Alkaline Phosphatase: 65 IU/L (ref 48–121)
Bilirubin Total: 0.3 mg/dL (ref 0.0–1.2)
Bilirubin, Direct: 0.09 mg/dL (ref 0.00–0.40)
Total Protein: 7.9 g/dL (ref 6.0–8.5)

## 2020-06-24 NOTE — Assessment & Plan Note (Signed)
History of CAD status post CABG x5 by Dr. Cyndia Bent 01/05/2019 with a LIMA to the LAD, vein to diagonal branch, obtuse marginal branches 1 and 2 sequentially and to the RCA.  She denies chest pain or shortness of breath.  She did complete cardiac rehabilitation.

## 2020-06-24 NOTE — Progress Notes (Signed)
06/24/2020 Jessica [name]   [birth year]  [id number]  Primary Physician Hoyt Koch, MD Primary Cardiologist: Lorretta Harp MD FACP, [place], [place], [place]  HPI:  Jessica [name] is a 69 y.o.  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 She presented to [facility] on 12/31/2018 with unstable angina and underwent cardiac catheterization by Dr. Irish Lack the following day revealing three-vessel disease with preserved LV function. On 01/05/2019 she underwent CABG x5 by Dr. Cyndia Bent the LIMA to her LAD, vein to diagonal branch, RCA and obtuse marginal branches 1 and 2 sequentially. She is been complaining of some musculoskeletal pains at home with some mild shortness of breath. She did not participate in cardiac rehab because of COVID-19. She was put on high-dose statin therapy which she was intolerant to all that responsive to.  Since last saw her  6 months ago she continues to do well.  She has stopped her Repatha because of intolerance.  She is intolerant to statin drugs as well.  She did complete cardiac rehab.  She denies chest pain or shortness of breath.  She was complaining of claudication type symptoms when I saw her last.  Lower extremity arterial Doppler studies were normal and her symptoms have resolved spontaneously.  Current Meds  Medication Sig   Ascorbic Acid (VITAMIN C) 1000 MG tablet Take 2,000 mg by mouth daily.   aspirin EC 325 MG EC tablet Take 1 tablet (325 mg total) by mouth daily. (Patient taking differently: Take 325 mg by mouth at bedtime. )   Cholecalciferol (VITAMIN D-3) 125 MCG (5000 UT) TABS Take 5,000 Units by mouth daily.   hydrocortisone (ANUSOL-HC) 2.5 % rectal cream Place 1 application rectally 2 (two) times daily.   loratadine (CLARITIN) 10 MG tablet Take 10 mg by mouth every evening.    metoprolol  tartrate (LOPRESSOR) 25 MG tablet Take 0.5 tablets (12.5 mg total) by mouth daily.   Multiple Vitamin (MULTIVITAMIN WITH MINERALS) TABS tablet Take 1 tablet by mouth daily.     Allergies  Allergen Reactions   Codeine Nausea And Vomiting   Sulfa Antibiotics Swelling   Sulfacetamide Swelling    Social History   Socioeconomic History   Marital status: Married    Spouse name: Not on file   Number of children: Not on file   Years of education: Not on file   Highest education level: Master's degree (e.g., MA, MS, MEng, MEd, MSW, MBA)  Occupational History   Not on file  Tobacco Use   Smoking status: Never Smoker   Smokeless tobacco: Never Used  Vaping Use   Vaping Use: Never used  Substance and Sexual Activity   Alcohol use: No   Drug use: No   Sexual activity: Yes  Other Topics Concern   Not on file  Social History Narrative   Not on file   Social Determinants of Health   Financial Resource Strain:    Difficulty of Paying Living Expenses: Not on file  Food Insecurity:    Worried About Running Out of Food in the Last Year: Not on file   Ran Out of Food in the Last Year: Not on file  Transportation Needs:    Lack of Transportation (Medical): Not on file   Lack of Transportation (Non-Medical): Not on file  Physical Activity:    Days of Exercise per Week: Not  on file   Minutes of Exercise per Session: Not on file  Stress:    Feeling of Stress : Not on file  Social Connections:    Frequency of Communication with Friends and Family: Not on file   Frequency of Social Gatherings with Friends and Family: Not on file   Attends Religious Services: Not on file   Active Member of Clubs or Organizations: Not on file   Attends Archivist Meetings: Not on file   Marital Status: Not on file  Intimate Partner Violence:    Fear of Current or Ex-Partner: Not on file   Emotionally Abused: Not on file   Physically Abused: Not on file    Sexually Abused: Not on file     Review of Systems: General: negative for chills, fever, night sweats or weight changes.  Cardiovascular: negative for chest pain, dyspnea on exertion, edema, orthopnea, palpitations, paroxysmal nocturnal dyspnea or shortness of breath Dermatological: negative for rash Respiratory: negative for cough or wheezing Urologic: negative for hematuria Abdominal: negative for nausea, vomiting, diarrhea, bright red blood per rectum, melena, or hematemesis Neurologic: negative for visual changes, syncope, or dizziness All other systems reviewed and are otherwise negative except as noted above.    Blood pressure 122/72, pulse (!) 45, height 5\' 6"  (1.676 m), weight 193 lb (87.5 kg), SpO2 99 %.  General appearance: alert and no distress Neck: no adenopathy, no carotid bruit, no JVD, supple, symmetrical, trachea midline and thyroid not enlarged, symmetric, no tenderness/mass/nodules Lungs: clear to auscultation bilaterally Heart: regular rate and rhythm, S1, S2 normal, no murmur, click, rub or gallop Extremities: extremities normal, atraumatic, no cyanosis or edema Pulses: 2+ and symmetric Skin: Skin color, texture, turgor normal. No rashes or lesions Neurologic: Alert and oriented X 3, normal strength and tone. Normal symmetric reflexes. Normal coordination and gait  EKG sinus bradycardia 45 with RSR prime in lead V1 and II suggesting RV conduction delay with poor R wave progression and small inferior Q waves.  I personally reviewed this EKG.  ASSESSMENT AND PLAN:   Hyperlipidemia History of hyperlipidemia intolerant to statin therapy on Repatha in the past which apparently has been discontinued because she did not tolerate this.  Lipid profile performed 03/14/2020 revealed a total cholesterol of 117 LDL 59 and HDL 36 on Repatha.  We will recheck a lipid liver profile today.  S/P CABG x 5 History of CAD status post CABG x5 by Dr. Cyndia Bent 01/05/2019 with a LIMA to the  LAD, vein to diagonal branch, obtuse marginal branches 1 and 2 sequentially and to the RCA.  She denies chest pain or shortness of breath.  She did complete cardiac rehabilitation.  Claudication in peripheral vascular disease ([place]) Lower extremity Dopplers are normal.  She no longer has claudication symptoms.      Lorretta Harp MD FACP,FACC,FAHA, [facility] 06/24/2020 9:41 AM

## 2020-06-24 NOTE — Patient Instructions (Signed)
Medication Instructions:  The current medical regimen is effective;  continue present plan and medications.  *If you need a refill on your cardiac medications before your next appointment, please call your pharmacy*   Lab Work: LIPID/LIVER today   If you have labs (blood work) drawn today and your tests are completely normal, you will receive your results only by: [name] MyChart Message (if you have MyChart) OR . A paper copy in the mail If you have any lab test that is abnormal or we need to change your treatment, we will call you to review the results.    Follow-Up: At [facility], you and your health needs are our priority.  As part of our continuing mission to provide you with exceptional heart care, we have created designated Provider Care Teams.  These Care Teams include your primary Cardiologist (physician) and Advanced Practice Providers (APPs -  Physician Assistants and Nurse Practitioners) who all work together to provide you with the care you need, when you need it.  We recommend signing up for the patient portal called "MyChart".  Sign up information is provided on this After Visit Summary.  MyChart is used to connect with patients for Virtual Visits (Telemedicine).  Patients are able to view lab/test results, encounter notes, upcoming appointments, etc.  Non-urgent messages can be sent to your provider as well.   To learn more about what you can do with MyChart, go to [email or web address].    Your next appointment:   Follow up in 6 months with APP (PA, NP) Follow up in 12 months with Dr.Berry

## 2020-06-24 NOTE — Assessment & Plan Note (Signed)
History of hyperlipidemia intolerant to statin therapy on Repatha in the past which apparently has been discontinued because she did not tolerate this.  Lipid profile performed 03/14/2020 revealed a total cholesterol of 117 LDL 59 and HDL 36 on Repatha.  We will recheck a lipid liver profile today.

## 2020-06-24 NOTE — Assessment & Plan Note (Signed)
Lower extremity Dopplers are normal.  She no longer has claudication symptoms.

## 2020-07-05 ENCOUNTER — Ambulatory Visit: Payer: Medicare PPO

## 2020-07-07 ENCOUNTER — Ambulatory Visit: Payer: Medicare PPO

## 2020-07-11 ENCOUNTER — Ambulatory Visit: Payer: Medicare PPO

## 2020-07-11 ENCOUNTER — Ambulatory Visit: Payer: Medicare PPO | Admitting: Family Medicine

## 2020-07-13 ENCOUNTER — Ambulatory Visit: Payer: Medicare PPO

## 2020-07-21 ENCOUNTER — Encounter: Payer: Self-pay | Admitting: Internal Medicine

## 2020-07-22 ENCOUNTER — Ambulatory Visit: Payer: Medicare PPO | Admitting: Family Medicine

## 2020-08-04 ENCOUNTER — Other Ambulatory Visit: Payer: Self-pay | Admitting: *Deleted

## 2020-08-04 MED ORDER — ASPIRIN EC 81 MG PO TBEC: 81.0000 mg | DELAYED_RELEASE_TABLET | Freq: Every day | ORAL | 3 refills | Status: DC

## 2020-08-08 ENCOUNTER — Telehealth: Payer: Self-pay | Admitting: Cardiovascular Disease

## 2020-08-08 ENCOUNTER — Ambulatory Visit (INDEPENDENT_AMBULATORY_CARE_PROVIDER_SITE_OTHER): Payer: Medicare PPO | Admitting: *Deleted

## 2020-08-08 ENCOUNTER — Other Ambulatory Visit: Payer: Self-pay

## 2020-08-08 DIAGNOSIS — Z23 Encounter for immunization: Secondary | ICD-10-CM | POA: Diagnosis not present

## 2020-08-08 NOTE — Telephone Encounter (Signed)
Pt c/o medication issue:  1. Name of Medications: Repatha   2. How are you currently taking this medication (dosage and times per day)? One injection every 2 weeks   3. Are you having a reaction (difficulty breathing--STAT)? no  4. What is your medication issue? Patient gets winded walking up the stairs and gets winded. She also gets weakness in her legs. Her body seems to adapt but then it's time to take another one again . She wanted to talk to a pharmacist about the medication    The patient was also confused about whether she needs to take a 325 mg Aspirin or a 81 mg aspirin. She was taking 325 mg but it changed when she was discharged from the hospital.  Please advise

## 2020-08-09 MED ORDER — PRALUENT 75 MG/ML ~~LOC~~ SOAJ: 75.0000 mg | SUBCUTANEOUS | 0 refills | Status: DC

## 2020-08-09 NOTE — Telephone Encounter (Signed)
Spoke with patient.  She was concerned about being on "too high" dose of aspirin.  Explained that we only need 81 mg, but that 325 was not an overdose, just more than she really needed.    States Repatha causes shortness of breath and muscle weakness, lasts 7-10 days.  About the time she recovers from it, it's time for another dose.  Will have her try Praluent 75 mg in 3-4 weeks (after letting Repatha clear out of his system).  She can call to let us know how she does with this.   Patient voiced understanding.

## 2020-08-15 DIAGNOSIS — H04123 Dry eye syndrome of bilateral lacrimal glands: Secondary | ICD-10-CM | POA: Diagnosis not present

## 2020-08-25 ENCOUNTER — Encounter: Payer: Self-pay | Admitting: Intensive Care

## 2020-08-25 ENCOUNTER — Emergency Department
Admission: EM | Admit: 2020-08-25 | Discharge: 2020-08-25 | Disposition: A | Discharge status: Home / Self Care | Payer: Medicare PPO | Attending: Emergency Medicine | Admitting: Emergency Medicine

## 2020-08-25 ENCOUNTER — Emergency Department: Payer: Medicare PPO

## 2020-08-25 ENCOUNTER — Other Ambulatory Visit: Payer: Self-pay

## 2020-08-25 ENCOUNTER — Ambulatory Visit
Admission: EM | Admit: 2020-08-25 | Discharge: 2020-08-25 | Disposition: A | Discharge status: Home / Self Care | Payer: Medicare PPO

## 2020-08-25 DIAGNOSIS — M779 Enthesopathy, unspecified: Secondary | ICD-10-CM | POA: Diagnosis not present

## 2020-08-25 DIAGNOSIS — Z7982 Long term (current) use of aspirin: Secondary | ICD-10-CM | POA: Diagnosis not present

## 2020-08-25 DIAGNOSIS — Z853 Personal history of malignant neoplasm of breast: Secondary | ICD-10-CM | POA: Diagnosis not present

## 2020-08-25 DIAGNOSIS — M79605 Pain in left leg: Secondary | ICD-10-CM | POA: Diagnosis present

## 2020-08-25 DIAGNOSIS — M76892 Other specified enthesopathies of left lower limb, excluding foot: Secondary | ICD-10-CM

## 2020-08-25 DIAGNOSIS — X58XXXA Exposure to other specified factors, initial encounter: Secondary | ICD-10-CM | POA: Insufficient documentation

## 2020-08-25 DIAGNOSIS — S76312A Strain of muscle, fascia and tendon of the posterior muscle group at thigh level, left thigh, initial encounter: Secondary | ICD-10-CM | POA: Diagnosis not present

## 2020-08-25 DIAGNOSIS — M79662 Pain in left lower leg: Secondary | ICD-10-CM | POA: Diagnosis not present

## 2020-08-25 DIAGNOSIS — M25562 Pain in left knee: Secondary | ICD-10-CM | POA: Diagnosis not present

## 2020-08-25 NOTE — ED Provider Notes (Signed)
[facility] Emergency Department Provider Note  ____________________________________________   First MD Initiated Contact with Patient 08/25/20 1630     (approximate)  I have reviewed the triage vital signs and the nursing notes.   HISTORY  Chief Complaint Leg Pain  HPI Jessica [name] is a 69 y.o. female who presents to the emergency department for evaluation of left leg pain.  The pain has been present for 2 to 3 days, is rated at 9/10 and is located in the posterior aspect of the left leg, just behind the knee and into the lower portion of the hamstrings.  Patient had no twist, fall or injury that she can recall.  She has no history of blood clots but presents today wanting a scan for clots because her husband had a similar situation with a VTE.  She has tried Tylenol without any relief of her symptoms.  Her pain is made worse when she walks or stands, is mildly improved with rest.  She denies any swelling of her leg, denies chest pain, shortness of breath, decreased motion.         Past Medical History:  Diagnosis Date  . Allergy   . Breast cancer ([place]) 2000  . Cancer [facility])    breast  . Colon polyp   . Coronary artery disease   . GERD (gastroesophageal reflux disease)   . History of blood transfusion   . Hyperlipidemia   . Migraine    history of migrarines, none as an adult  . Personal history of radiation therapy 2000    Patient Active Problem List   Diagnosis Date Noted  . Pain and swelling of right upper extremity 06/05/2020  . Cervical disc disorder with radiculopathy of cervical region 05/24/2020  . Post inflammatory hypopigmentation 12/30/2019  . Claudication in peripheral vascular disease ([place]) 12/22/2019  . Arthritis of carpometacarpal [facility]) joint of left thumb 10/28/2019  . Hand arthritis 10/01/2019  . Statin myopathy 08/30/2019  . Muscle cramps 06/19/2019  . Hyperlipidemia 04/08/2019  . S/P CABG x 5 01/05/2019  .  Degenerative joint disease of low back 01/09/2017  . Routine general medical examination at a health care facility 10/02/2016  . GERD (gastroesophageal reflux disease) 07/28/2016  . H/O malignant neoplasm of breast 06/19/2016    Past Surgical History:  Procedure Laterality Date  . ABDOMINAL HYSTERECTOMY    . BREAST LUMPECTOMY Right 2000  . BREAST SURGERY Right    calcifaction removed  . BUNIONECTOMY Bilateral   . COLONOSCOPY W/ POLYPECTOMY    . CORONARY ARTERY BYPASS GRAFT N/A 01/05/2019   Procedure: CORONARY ARTERY BYPASS GRAFTING (CABG), using right leg saphenous endoscopic and open vein harvest, exploration left leg;  Surgeon: Gaye Pollack, MD;  Location: [place] OR;  Service: Open Heart Surgery;  Laterality: N/A;  . EYE SURGERY Bilateral    cataract  . hemorrhoid    . LEFT HEART CATH AND CORONARY ANGIOGRAPHY N/A 01/01/2019   Procedure: LEFT HEART CATH AND CORONARY ANGIOGRAPHY;  Surgeon: Jettie Booze, MD;  Location: [place] CV LAB;  Service: Cardiovascular;  Laterality: N/A;  . STERNAL WIRES REMOVAL N/A 05/11/2019   Procedure: STERNAL WIRES REMOVAL;  Surgeon: Gaye Pollack, MD;  Location: [place];  Service: Thoracic;  Laterality: N/A;  . TEE WITHOUT CARDIOVERSION N/A 01/05/2019   Procedure: TRANSESOPHAGEAL ECHOCARDIOGRAM (TEE);  Surgeon: Gaye Pollack, MD;  Location: [place];  Service: Open Heart Surgery;  Laterality: N/A;    Prior to Admission medications  Medication Sig Start Date End Date Taking? Authorizing Provider  Alirocumab (PRALUENT) 75 MG/ML SOAJ Inject 75 mg into the skin every 14 (fourteen) days. 08/09/20   Lorretta Harp, MD  Ascorbic Acid (VITAMIN C) 1000 MG tablet Take 2,000 mg by mouth daily.    [provider]  aspirin EC 81 MG tablet Take 1 tablet (81 mg total) by mouth daily. Swallow whole. 08/04/20   Lorretta Harp, MD  Cholecalciferol (VITAMIN D-3) 125 MCG (5000 UT) TABS Take 5,000 Units by mouth daily.    [provider]   hydrocortisone (ANUSOL-HC) 2.5 % rectal cream Place 1 application rectally 2 (two) times daily. 09/21/19   Hoyt Koch, MD  loratadine (CLARITIN) 10 MG tablet Take 10 mg by mouth every evening.     [provider]  metoprolol tartrate (LOPRESSOR) 25 MG tablet Take 0.5 tablets (12.5 mg total) by mouth daily. 12/22/19   Lorretta Harp, MD  Multiple Vitamin (MULTIVITAMIN WITH MINERALS) TABS tablet Take 1 tablet by mouth daily.    [provider]    Allergies Codeine, Sulfa antibiotics, and Sulfacetamide  Family History  Problem Relation Age of Onset  . Heart disease Brother   . Asthma Paternal Aunt   . Diabetes Paternal Aunt   . Stroke Paternal Uncle   . Kidney disease Paternal Uncle     Social History Social History   Tobacco Use  . Smoking status: Never Smoker  . Smokeless tobacco: Never Used  Vaping Use  . Vaping Use: Never used  Substance Use Topics  . Alcohol use: No  . Drug use: No    Review of Systems Constitutional: No fever/chills Eyes: No visual changes. ENT: No sore throat. Cardiovascular: Denies chest pain. Respiratory: Denies shortness of breath. Gastrointestinal: No abdominal pain.  No nausea, no vomiting.  No diarrhea.  No constipation. Genitourinary: Negative for dysuria. Musculoskeletal: + Left leg pain, negative for back pain. Skin: Negative for rash. Neurological: Negative for headaches, focal weakness or numbness.   ____________________________________________   PHYSICAL EXAM:  VITAL SIGNS: ED Triage Vitals  Enc Vitals Group     BP 08/25/20 1621 (!) 122/95     Pulse Rate 08/25/20 1621 60     Resp 08/25/20 1621 16     Temp 08/25/20 1621 97.9 F (36.6 C)     Temp Source 08/25/20 1621 Oral     SpO2 08/25/20 1621 100 %     Weight 08/25/20 1622 189 lb (85.7 kg)     Height 08/25/20 1622 5\' 6"  (1.676 m)     Head Circumference --      Peak Flow --      Pain Score 08/25/20 1622 9     Pain Loc --      Pain Edu? --       Excl. in [place]? --     Constitutional: Alert and oriented. Well appearing and in no acute distress. Eyes: Conjunctivae are normal. PERRL. EOMI. Head: Atraumatic. Nose: No congestion/rhinnorhea. Mouth/Throat: Mucous membranes are moist.  Oropharynx non-erythematous. Neck: No stridor.   Cardiovascular: Normal rate, regular rhythm. Grossly normal heart sounds.  Good peripheral circulation. Respiratory: Normal respiratory effort.  No retractions. Lungs CTAB. Musculoskeletal: There is mild tenderness to palpation of the posterior aspect of the left knee and medial hamstring tendons.  Patient maintains full range of motion of the left knee and ankle.  5/5 strength of the left leg and ankle plantarflexion, dorsiflexion, knee flexion, knee extension, hip flexion.  There  is no swelling or enlargement of the left calf, no erythema. Neurologic:  Normal speech and language. No gross focal neurologic deficits are appreciated. No gait instability. Skin:  Skin is warm, dry and intact. No rash noted. Psychiatric: Mood and affect are normal. Speech and behavior are normal.   ____________________________________________  RADIOLOGY I, Marlana Salvage, personally viewed and evaluated these images (plain radiographs) as part of my medical decision making, as well as reviewing the written report by the radiologist.  ED provider interpretation: X-rays of the left knee show no acute abnormalities.  Official radiology report(s): US Venous Img Lower Unilateral Left  Result Date: 08/25/2020 CLINICAL DATA:  Left calf pain for several days. Breast cancer diagnosed in 1999. EXAM: LEFT LOWER EXTREMITY VENOUS DOPPLER ULTRASOUND TECHNIQUE: Gray-scale sonography with compression, as well as color and duplex ultrasound, were performed to evaluate the deep venous system(s) from the level of the common femoral vein through the popliteal and proximal calf veins. COMPARISON:  None. FINDINGS: VENOUS Normal compressibility of  the common femoral, superficial femoral, and popliteal veins, as well as the visualized calf veins. Visualized portions of profunda femoral vein and great saphenous vein unremarkable. No filling defects to suggest DVT on grayscale or color Doppler imaging. Doppler waveforms show normal direction of venous flow, normal respiratory plasticity and response to augmentation. Limited views of the contralateral common femoral vein are unremarkable. OTHER None. Limitations: none IMPRESSION: Normal examination.  No deep venous thrombosis seen. Electronically Signed   By: Claudie Revering M.D.   On: 08/25/2020 17:30   DG Knee Complete 4 Views Left  Result Date: 08/25/2020 CLINICAL DATA:  Left popliteal knee pain X 3 days.  No known injury. EXAM: LEFT KNEE - COMPLETE 4+ VIEW COMPARISON:  None. FINDINGS: No evidence of fracture, dislocation, or joint effusion. No evidence of arthropathy or other focal bone abnormality. Soft tissues are grossly unremarkable. IMPRESSION: No acute displaced fracture or dislocation. Electronically Signed   By: Iven Finn M.D.   On: 08/25/2020 17:25     ____________________________________________   INITIAL IMPRESSION / ASSESSMENT AND PLAN / ED COURSE  As part of my medical decision making, I reviewed the following data within the [facility] notes reviewed and incorporated and Radiograph reviewed         Patient is a 69 year old female who presents to the emergency department for evaluation of left-sided leg pain just behind the knee and in the lower hamstring region.  See HPI for further details.  Physical exam demonstrates some tenderness over the medial hamstring tendons, but no swelling, erythema or fullness in the posterior knee.  Patient maintains full range of motion and has normal pulses.  Patient is most concerned about a clot.  Venous study is negative for acute DVT.  X-rays of the left knee are negative for any acute findings.  Given the  patient's physical exam as well as imaging findings, feel that this is most likely related to tendinitis versus strain of hamstring tendon.  The patient was cautioned to return to the emergency department if she experiences any limb swelling, erythema, worsening pain, decreased range of motion of the knee.  The patient is amenable with this plan and is stable at this time for outpatient therapy.  She will take over-the-counter medications as needed for her symptoms.     ____________________________________________   FINAL CLINICAL IMPRESSION(S) / ED DIAGNOSES  Final diagnoses:  Hamstring tendinitis of left thigh     ED Discharge Orders  None      *Please note:  Timberlee Roblero was evaluated in Emergency Department on 08/25/2020 for the symptoms described in the history of present illness. She was evaluated in the context of the global COVID-19 pandemic, which necessitated consideration that the patient might be at risk for infection with the SARS-CoV-2 virus that causes COVID-19. Institutional protocols and algorithms that pertain to the evaluation of patients at risk for COVID-19 are in a state of rapid change based on information released by regulatory bodies including the CDC and federal and state organizations. These policies and algorithms were followed during the patient's care in the ED.  Some ED evaluations and interventions may be delayed as a result of limited staffing during and the pandemic.*   Note:  This document was prepared using Dragon voice recognition software and may include unintentional dictation errors.    Marlana Salvage, PA 08/25/20 2213    Vladimir Crofts, MD 08/25/20 2251

## 2020-08-25 NOTE — Discharge Instructions (Addendum)
Please use Tylenol and anti-inflammatories for your left leg pain.  You may ice it several times per day, up to 20 minutes at a time.  Please follow-up with your primary care if this does not improve, or return to the emergency department with any worsening.

## 2020-08-25 NOTE — ED Triage Notes (Signed)
Patient c/o left calf/under knee pain X2-3 that has progressively gotten worse. Able to ambulate. Denies injury

## 2020-09-13 DIAGNOSIS — M79642 Pain in left hand: Secondary | ICD-10-CM | POA: Diagnosis not present

## 2020-09-21 ENCOUNTER — Ambulatory Visit (INDEPENDENT_AMBULATORY_CARE_PROVIDER_SITE_OTHER): Payer: Medicare PPO | Admitting: Internal Medicine

## 2020-09-21 ENCOUNTER — Other Ambulatory Visit: Payer: Self-pay

## 2020-09-21 ENCOUNTER — Encounter: Payer: Self-pay | Admitting: Internal Medicine

## 2020-09-21 VITALS — BP 120/80 | HR 66 | Temp 98.7°F | Ht 66.0 in | Wt 189.4 lb

## 2020-09-21 DIAGNOSIS — Z0000 Encounter for general adult medical examination without abnormal findings: Secondary | ICD-10-CM

## 2020-09-21 DIAGNOSIS — E782 Mixed hyperlipidemia: Secondary | ICD-10-CM | POA: Diagnosis not present

## 2020-09-21 DIAGNOSIS — Z951 Presence of aortocoronary bypass graft: Secondary | ICD-10-CM | POA: Diagnosis not present

## 2020-09-21 DIAGNOSIS — K219 Gastro-esophageal reflux disease without esophagitis: Secondary | ICD-10-CM

## 2020-09-21 DIAGNOSIS — E2839 Other primary ovarian failure: Secondary | ICD-10-CM

## 2020-09-21 LAB — COMPREHENSIVE METABOLIC PANEL
ALT: 18 U/L (ref 0–35)
AST: 21 U/L (ref 0–37)
Albumin: 4.1 g/dL (ref 3.5–5.2)
Alkaline Phosphatase: 52 U/L (ref 39–117)
BUN: 13 mg/dL (ref 6–23)
CO2: 30 mEq/L (ref 19–32)
Calcium: 9.3 mg/dL (ref 8.4–10.5)
Chloride: 103 mEq/L (ref 96–112)
Creatinine, Ser: 1 mg/dL (ref 0.40–1.20)
GFR: 57.26 mL/min — ABNORMAL LOW (ref >60.00)
Glucose, Bld: 90 mg/dL (ref 70–99)
Potassium: 3.9 mEq/L (ref 3.5–5.1)
Sodium: 140 mEq/L (ref 135–145)
Total Bilirubin: 0.6 mg/dL (ref 0.2–1.2)
Total Protein: 7.2 g/dL (ref 6.0–8.3)

## 2020-09-21 LAB — CBC
HCT: 39.3 % (ref 36.0–46.0)
Hemoglobin: 13.1 g/dL (ref 12.0–15.0)
MCHC: 33.4 g/dL (ref 30.0–36.0)
MCV: 87.5 fl (ref 78.0–100.0)
Platelets: 298 10*3/uL (ref 150.0–400.0)
RBC: 4.49 Mil/uL (ref 3.87–5.11)
RDW: 13.8 % (ref 11.5–15.5)
WBC: 5.9 10*3/uL (ref 4.0–10.5)

## 2020-09-21 LAB — LIPID PANEL
Cholesterol: 144 mg/dL (ref 0–200)
HDL: 39.2 mg/dL (ref >39.00)
LDL Cholesterol: 77 mg/dL (ref 0–99)
NonHDL: 105.15
Total CHOL/HDL Ratio: 4
Triglycerides: 139 mg/dL (ref 0.0–149.0)
VLDL: 27.8 mg/dL (ref 0.0–40.0)

## 2020-09-21 LAB — HEMOGLOBIN A1C: Hgb A1c MFr Bld: 5.9 % (ref 4.6–6.5)

## 2020-09-21 NOTE — Assessment & Plan Note (Signed)
Resolved off meds after CABG

## 2020-09-21 NOTE — Assessment & Plan Note (Signed)
On beta blocker, plauent, aspirin 81 mg daily. Advised more exercise and she will work on this.

## 2020-09-21 NOTE — Assessment & Plan Note (Signed)
Checking lipid panel, on plauent.

## 2020-09-21 NOTE — Progress Notes (Signed)
Subjective:   Patient ID: Jessica [name], female    DOB: 10-01-1951, 69 y.o.   MRN: [id number]  HPI Here for medicare wellness and physical, no new complaints. Please see A/P for status and treatment of chronic medical problems.   Diet: heart healthy Physical activity: sedentary Depression/mood screen: negative Hearing: intact to whispered voice Visual acuity: grossly normal with lens, performs annual eye exam  ADLs: capable Fall risk: none Home safety: good Cognitive evaluation: intact to orientation, naming, recall and repetition EOL planning: adv directives discussed, doing soon    Office Visit from 09/21/2020 in [place] at [facility] Total Score 0        Pulmonary Rehab from 01/04/2020 in [facility]  PHQ-9 Total Score 4      I have personally reviewed and have noted 1. The patient's medical and social history - reviewed today no changes 2. Their use of alcohol, tobacco or illicit drugs 3. Their current medications and supplements 4. The patient's functional ability including ADL's, fall risks, home safety risks and hearing or visual impairment. 5. Diet and physical activities 6. Evidence for depression or mood disorders 7. Care team reviewed and updated  Patient Care Team: Hoyt Koch, MD as PCP - General (Internal Medicine) Lorretta Harp, MD as PCP - Cardiology (Cardiology) Past Medical History:  Diagnosis Date  . Allergy   . Breast cancer ([place]) 2000  . Cancer [facility])    breast  . Colon polyp   . Coronary artery disease   . GERD (gastroesophageal reflux disease)   . History of blood transfusion   . Hyperlipidemia   . Migraine    history of migrarines, none as an adult  . Personal history of radiation therapy 2000   Past Surgical History:  Procedure Laterality Date  . ABDOMINAL HYSTERECTOMY    . BREAST LUMPECTOMY Right 2000  . BREAST SURGERY Right    calcifaction removed  . BUNIONECTOMY  Bilateral   . COLONOSCOPY W/ POLYPECTOMY    . CORONARY ARTERY BYPASS GRAFT N/A 01/05/2019   Procedure: CORONARY ARTERY BYPASS GRAFTING (CABG), using right leg saphenous endoscopic and open vein harvest, exploration left leg;  Surgeon: Gaye Pollack, MD;  Location: [place] OR;  Service: Open Heart Surgery;  Laterality: N/A;  . EYE SURGERY Bilateral    cataract  . hemorrhoid    . LEFT HEART CATH AND CORONARY ANGIOGRAPHY N/A 01/01/2019   Procedure: LEFT HEART CATH AND CORONARY ANGIOGRAPHY;  Surgeon: Jettie Booze, MD;  Location: [place] CV LAB;  Service: Cardiovascular;  Laterality: N/A;  . STERNAL WIRES REMOVAL N/A 05/11/2019   Procedure: STERNAL WIRES REMOVAL;  Surgeon: Gaye Pollack, MD;  Location: [place];  Service: Thoracic;  Laterality: N/A;  . TEE WITHOUT CARDIOVERSION N/A 01/05/2019   Procedure: TRANSESOPHAGEAL ECHOCARDIOGRAM (TEE);  Surgeon: Gaye Pollack, MD;  Location: [place];  Service: Open Heart Surgery;  Laterality: N/A;   Family History  Problem Relation Age of Onset  . Heart disease Brother   . Asthma Paternal Aunt   . Diabetes Paternal Aunt   . Stroke Paternal Uncle   . Kidney disease Paternal Uncle    Review of Systems  Constitutional: Negative.   HENT: Negative.   Eyes: Negative.   Respiratory: Negative for cough, chest tightness and shortness of breath.   Cardiovascular: Negative for chest pain, palpitations and leg swelling.  Gastrointestinal: Negative for abdominal distention, abdominal pain, constipation, diarrhea, nausea and vomiting.  Musculoskeletal: Positive for myalgias.  Skin: Negative.   Neurological: Negative.   Psychiatric/Behavioral: Negative.     Objective:  Physical Exam Constitutional:      Appearance: She is well-developed.  HENT:     Head: Normocephalic and atraumatic.  Cardiovascular:     Rate and Rhythm: Normal rate and regular rhythm.  Pulmonary:     Effort: Pulmonary effort is normal. No respiratory distress.     Breath sounds:  Normal breath sounds. No wheezing or rales.  Abdominal:     General: Bowel sounds are normal. There is no distension.     Palpations: Abdomen is soft.     Tenderness: There is no abdominal tenderness. There is no rebound.  Musculoskeletal:     Cervical back: Normal range of motion.  Skin:    General: Skin is warm and dry.  Neurological:     Mental Status: She is alert and oriented to person, place, and time.     Coordination: Coordination normal.     Vitals:   09/21/20 0907  BP: 120/80  Pulse: 66  Temp: 98.7 F (37.1 C)  TempSrc: Oral  SpO2: 99%  Weight: 189 lb 6.4 oz (85.9 kg)  Height: 5\' 6"  (1.676 m)   This visit occurred during the SARS-CoV-2 public health emergency.  Safety protocols were in place, including screening questions prior to the visit, additional usage of staff PPE, and extensive cleaning of exam room while observing appropriate contact time as indicated for disinfecting solutions.   Assessment & Plan:

## 2020-09-21 NOTE — Assessment & Plan Note (Signed)
Flu shot up to date. Covid-19 up to date including booster she will provide date of this. Pneumonia complete. Shingrix counseled to get at pharmacy. Tetanus due 2027. Colonoscopy due 2025. Mammogram due 2022, pap smear aged out and dexa ordered today. Counseled about sun safety and mole surveillance. Counseled about the dangers of distracted driving. Given 10 year screening recommendations.

## 2020-09-21 NOTE — Patient Instructions (Signed)
Health Maintenance, Female Adopting a healthy lifestyle and getting preventive care are important in promoting health and wellness. Ask your health care provider about:  The right schedule for you to have regular tests and exams.  Things you can do on your own to prevent diseases and keep yourself healthy. What should I know about diet, weight, and exercise? Eat a healthy diet   Eat a diet that includes plenty of vegetables, fruits, low-fat dairy products, and lean protein.  Do not eat a lot of foods that are high in solid fats, added sugars, or sodium. Maintain a healthy weight Body mass index (BMI) is used to identify weight problems. It estimates body fat based on height and weight. Your health care provider can help determine your BMI and help you achieve or maintain a healthy weight. Get regular exercise Get regular exercise. This is one of the most important things you can do for your health. Most adults should:  Exercise for at least 150 minutes each week. The exercise should increase your heart rate and make you sweat (moderate-intensity exercise).  Do strengthening exercises at least twice a week. This is in addition to the moderate-intensity exercise.  Spend less time sitting. Even light physical activity can be beneficial. Watch cholesterol and blood lipids Have your blood tested for lipids and cholesterol at 70 years of age, then have this test every 5 years. Have your cholesterol levels checked more often if:  Your lipid or cholesterol levels are high.  You are older than 70 years of age.  You are at high risk for heart disease. What should I know about cancer screening? Depending on your health history and family history, you may need to have cancer screening at various ages. This may include screening for:  Breast cancer.  Cervical cancer.  Colorectal cancer.  Skin cancer.  Lung cancer. What should I know about heart disease, diabetes, and high blood  pressure? Blood pressure and heart disease  High blood pressure causes heart disease and increases the risk of stroke. This is more likely to develop in people who have high blood pressure readings, are of African descent, or are overweight.  Have your blood pressure checked: ? Every 3-5 years if you are 18-39 years of age. ? Every year if you are 40 years old or older. Diabetes Have regular diabetes screenings. This checks your fasting blood sugar level. Have the screening done:  Once every three years after age 40 if you are at a normal weight and have a low risk for diabetes.  More often and at a younger age if you are overweight or have a high risk for diabetes. What should I know about preventing infection? Hepatitis B If you have a higher risk for hepatitis B, you should be screened for this virus. Talk with your health care provider to find out if you are at risk for hepatitis B infection. Hepatitis C Testing is recommended for:  Everyone born from 1945 through 1965.  Anyone with known risk factors for hepatitis C. Sexually transmitted infections (STIs)  Get screened for STIs, including gonorrhea and chlamydia, if: ? You are sexually active and are younger than 70 years of age. ? You are older than 70 years of age and your health care provider tells you that you are at risk for this type of infection. ? Your sexual activity has changed since you were last screened, and you are at increased risk for chlamydia or gonorrhea. Ask your health care provider if   you are at risk.  Ask your health care provider about whether you are at high risk for HIV. Your health care provider may recommend a prescription medicine to help prevent HIV infection. If you choose to take medicine to prevent HIV, you should first get tested for HIV. You should then be tested every 3 months for as long as you are taking the medicine. Pregnancy  If you are about to stop having your period (premenopausal) and  you may become pregnant, seek counseling before you get pregnant.  Take 400 to 800 micrograms (mcg) of folic acid every day if you become pregnant.  Ask for birth control (contraception) if you want to prevent pregnancy. Osteoporosis and menopause Osteoporosis is a disease in which the bones lose minerals and strength with aging. This can result in bone fractures. If you are 65 years old or older, or if you are at risk for osteoporosis and fractures, ask your health care provider if you should:  Be screened for bone loss.  Take a calcium or vitamin D supplement to lower your risk of fractures.  Be given hormone replacement therapy (HRT) to treat symptoms of menopause. Follow these instructions at home: Lifestyle  Do not use any products that contain nicotine or tobacco, such as cigarettes, e-cigarettes, and chewing tobacco. If you need help quitting, ask your health care provider.  Do not use street drugs.  Do not share needles.  Ask your health care provider for help if you need support or information about quitting drugs. Alcohol use  Do not drink alcohol if: ? Your health care provider tells you not to drink. ? You are pregnant, may be pregnant, or are planning to become pregnant.  If you drink alcohol: ? Limit how much you use to 0-1 drink a day. ? Limit intake if you are breastfeeding.  Be aware of how much alcohol is in your drink. In the [place] one drink equals one 12 oz bottle of beer (355 mL), one 5 oz glass of wine (148 mL), or one 1 oz glass of hard liquor (44 mL). General instructions  Schedule regular health, dental, and eye exams.  Stay current with your vaccines.  Tell your health care provider if: ? You often feel depressed. ? You have ever been abused or do not feel safe at home. Summary  Adopting a healthy lifestyle and getting preventive care are important in promoting health and wellness.  Follow your health care provider's instructions about healthy  diet, exercising, and getting tested or screened for diseases.  Follow your health care provider's instructions on monitoring your cholesterol and blood pressure. This information is not intended to replace advice given to you by your health care provider. Make sure you discuss any questions you have with your health care provider. Document Revised: 10/01/2018 Document Reviewed: 10/01/2018 Elsevier Patient Education  2020 [employer]  

## 2020-09-23 ENCOUNTER — Other Ambulatory Visit: Payer: Self-pay

## 2020-09-23 ENCOUNTER — Ambulatory Visit (INDEPENDENT_AMBULATORY_CARE_PROVIDER_SITE_OTHER)
Admission: RE | Admit: 2020-09-23 | Discharge: 2020-09-23 | Disposition: A | Discharge status: Home / Self Care | Payer: Medicare PPO | Source: Ambulatory Visit | Attending: Internal Medicine | Admitting: Internal Medicine

## 2020-09-23 DIAGNOSIS — E2839 Other primary ovarian failure: Secondary | ICD-10-CM

## 2020-09-28 ENCOUNTER — Other Ambulatory Visit: Payer: Self-pay | Admitting: Internal Medicine

## 2020-09-28 DIAGNOSIS — Z1231 Encounter for screening mammogram for malignant neoplasm of breast: Secondary | ICD-10-CM

## 2020-09-29 DIAGNOSIS — H04223 Epiphora due to insufficient drainage, bilateral lacrimal glands: Secondary | ICD-10-CM | POA: Diagnosis not present

## 2020-09-29 DIAGNOSIS — H04123 Dry eye syndrome of bilateral lacrimal glands: Secondary | ICD-10-CM | POA: Diagnosis not present

## 2020-09-29 DIAGNOSIS — H04222 Epiphora due to insufficient drainage, left lacrimal gland: Secondary | ICD-10-CM | POA: Diagnosis not present

## 2020-09-29 DIAGNOSIS — H04221 Epiphora due to insufficient drainage, right lacrimal gland: Secondary | ICD-10-CM | POA: Diagnosis not present

## 2020-10-11 DIAGNOSIS — M65351 Trigger finger, right little finger: Secondary | ICD-10-CM | POA: Diagnosis not present

## 2020-10-11 DIAGNOSIS — M65352 Trigger finger, left little finger: Secondary | ICD-10-CM | POA: Diagnosis not present

## 2020-10-11 DIAGNOSIS — M65841 Other synovitis and tenosynovitis, right hand: Secondary | ICD-10-CM | POA: Diagnosis not present

## 2020-10-11 DIAGNOSIS — M65842 Other synovitis and tenosynovitis, left hand: Secondary | ICD-10-CM | POA: Diagnosis not present

## 2020-10-13 ENCOUNTER — Ambulatory Visit: Payer: Medicare PPO | Admitting: Family Medicine

## 2020-10-24 ENCOUNTER — Ambulatory Visit: Payer: Medicare PPO | Attending: Orthopedic Surgery | Admitting: Occupational Therapy

## 2020-10-24 ENCOUNTER — Encounter: Payer: Self-pay | Admitting: Occupational Therapy

## 2020-10-24 ENCOUNTER — Other Ambulatory Visit: Payer: Self-pay

## 2020-10-24 DIAGNOSIS — M79645 Pain in left finger(s): Secondary | ICD-10-CM | POA: Insufficient documentation

## 2020-10-24 DIAGNOSIS — M25641 Stiffness of right hand, not elsewhere classified: Secondary | ICD-10-CM | POA: Insufficient documentation

## 2020-10-24 DIAGNOSIS — M778 Other enthesopathies, not elsewhere classified: Secondary | ICD-10-CM | POA: Diagnosis not present

## 2020-10-24 DIAGNOSIS — M25642 Stiffness of left hand, not elsewhere classified: Secondary | ICD-10-CM | POA: Insufficient documentation

## 2020-10-24 NOTE — Patient Instructions (Signed)
Contrast on bilateral hands-  2 x day  Light pain free tendon glides  10 reps Opposition to all digits = keeping 0 5 reps   Joint protection principles review and hand out provided  CMC neoprene splint fitted and pt ed on using L thumb - had 7 lbs increase lat grip and less pain  Use with activities that cause pain  Can do ice massage over A1 pulley's of bilateral 5th and R 2nd- several times day

## 2020-10-24 NOTE — Therapy (Signed)
[place] [facility] [address] [place], [address] Phone: [phone]   Fax:  [phone]  Occupational Therapy Evaluation  Patient Details  Name: Jessica [name] MRN: [id number] Date of Birth: Feb 08, 1951 Referring Provider (OT): Dr Louanne Skye   Encounter Date: 10/24/2020   OT End of Session - 10/24/20 1046    Visit Number 1    Number of Visits 6    Date for OT Re-Evaluation 11/14/20    OT Start Time 0903    OT Stop Time 1010    OT Time Calculation (min) 67 min    Activity Tolerance Patient tolerated treatment well    Behavior During Therapy [facility] for tasks assessed/performed           Past Medical History:  Diagnosis Date  . Allergy   . Breast cancer ([place]) 2000  . Cancer [facility])    breast  . Colon polyp   . Coronary artery disease   . GERD (gastroesophageal reflux disease)   . History of blood transfusion   . Hyperlipidemia   . Migraine    history of migrarines, none as an adult  . Personal history of radiation therapy 2000    Past Surgical History:  Procedure Laterality Date  . ABDOMINAL HYSTERECTOMY    . BREAST LUMPECTOMY Right 2000  . BREAST SURGERY Right    calcifaction removed  . BUNIONECTOMY Bilateral   . COLONOSCOPY W/ POLYPECTOMY    . CORONARY ARTERY BYPASS GRAFT N/A 01/05/2019   Procedure: CORONARY ARTERY BYPASS GRAFTING (CABG), using right leg saphenous endoscopic and open vein harvest, exploration left leg;  Surgeon: Gaye Pollack, MD;  Location: [place] OR;  Service: Open Heart Surgery;  Laterality: N/A;  . EYE SURGERY Bilateral    cataract  . hemorrhoid    . LEFT HEART CATH AND CORONARY ANGIOGRAPHY N/A 01/01/2019   Procedure: LEFT HEART CATH AND CORONARY ANGIOGRAPHY;  Surgeon: Jettie Booze, MD;  Location: [place] [facility];  Service: Cardiovascular;  Laterality: N/A;  . STERNAL WIRES REMOVAL N/A 05/11/2019   Procedure: STERNAL WIRES REMOVAL;  Surgeon: Gaye Pollack, MD;   Location: [place];  Service: Thoracic;  Laterality: N/A;  . TEE WITHOUT CARDIOVERSION N/A 01/05/2019   Procedure: TRANSESOPHAGEAL ECHOCARDIOGRAM (TEE);  Surgeon: Gaye Pollack, MD;  Location: [place];  Service: Open Heart Surgery;  Laterality: N/A;    There were no vitals filed for this visit.   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 Yes    Pain Score 6    2/10 L thumb CMC with AROM   Pain Location Hand    Pain Orientation Right;Left    Pain Descriptors / Indicators Tender   bilateral 5th A1pulleys , R 2nd   Pain Type Acute pain;Chronic pain    Pain Onset More than a month ago    Pain Frequency Intermittent             [facility] OT Assessment - 10/24/20 0001      Assessment   Medical Diagnosis Bilateral hand tenosynovitis, L thumb CMC OA -    Referring Provider (OT) Dr Louanne Skye    Onset Date/Surgical Date --   05/2020   Hand Dominance Right    Prior Therapy --   L thumb CMC splint fabricated 8/21 - cannot wearing     Balance Screen   Has the patient fallen in the past 6 months No      Home  Environment   Lives With Spouse      Prior Function   Vocation Retired    Pharmacist, hospital   Leisure likes to read, computer, cook, yard work , house work , gym 3 x wk      Strength   Right Hand Grip (lbs) 65    Right Hand Lateral Pinch 20 lbs    Right Hand 3 Point Pinch 11 lbs    Left Hand Grip (lbs) 55    Left Hand Lateral Pinch 12 lbs    Left Hand 3 Point Pinch 9 lbs      Right Hand AROM   R Thumb Opposition to Index --   opposition to all digits, DPC   R Index  MCP 0-90 80 Degrees    R Index PIP 0-100 100 Degrees    R Long  MCP 0-90 80 Degrees    R Long PIP 0-100 100 Degrees    R Ring  MCP 0-90 80 Degrees    R Ring PIP 0-100 100 Degrees    R Little  MCP 0-90 80 Degrees    R Little PIP 0-100 95 Degrees      Left Hand AROM   L Thumb Opposition to Index --   opposition to all digits - pain end range and lat grip   L Index  MCP 0-90 90 Degrees    L Index PIP 0-100 0 Degrees    L Long  MCP 0-90 90 Degrees    L Long PIP 0-100 90 Degrees    L Ring  MCP 0-90 90 Degrees    L Ring PIP 0-100 95 Degrees    L Little  MCP 0-90 90 Degrees    L Little PIP 0-100 95 Degrees                    OT Treatments/Exercises (OP) - 10/24/20 0001      RUE Contrast Bath   Time 9 minutes    Comments prior to review of HEP , decrease stiffness      LUE Contrast Bath   Time 9 minutes    Comments prior to revie of HEP - decrease stiffness, pain thumb            Contrast on bilateral hands-  2 x day  Light pain free tendon glides  10 reps Opposition to all digits = keeping 0 5 reps   Joint protection principles  review and hand out provided  [facility] neoprene splint fitted and pt ed on using L thumb - had 7 lbs increase lat grip and less pain  Use with activities that cause pain  Can do ice massage over A1 pulley's of bilateral 5th and R 2nd- several times day        OT Education - 10/24/20 1046    Education Details HEP review - findings of eval and splint wearing    Person(s) Educated Patient    Methods Explanation;Demonstration;Tactile cues;Verbal  cues;Handout    Comprehension Verbal cues required;Returned demonstration;Verbalized understanding            OT Short Term Goals - 10/24/20 1052      OT SHORT TERM GOAL #1   Title Pt to be independent in use of correct splint to decrease pain and increase lat grip in L thumb    Baseline had hard fabricated CMC splint but could not wear it - fitted this date with [facility] neoprene    Time 1    Period Weeks    Status New    Target Date 10/31/20             OT Long Term Goals - 10/24/20 1053      OT LONG TERM GOAL #1   Title Pt report pain less than 3/10 in bilateral hands over [facility] and A1pulley's to maintain and improve flexion /extention of digits    Baseline decrease R MC end range flexion and extention , pain and edema over 2nd and 3rd MC mostly - and A1pulley's of bilateral 5th 's and R 2nd    Time 3    Period Weeks    Status New    Target Date 11/14/20      OT LONG TERM GOAL #2   Title Pt to verbalize and demo 3 joint protection and AE to increase ease in ADL's and decrease pain    Baseline no knowledge    Time 3    Period Weeks    Status New    Target Date 11/14/20                 Plan - 10/24/20 1047    Clinical Impression Statement Pt refer to OT with bilateral hands tenosynovitis with bilateral 5th digits worse - still 6/10 tenderness over A1pulley and R 2nd - Pt positive Grinding test on L thumb - CMC OA L worse than R , and PIP of R 3rd - pt show decrease Lat grip , increase pain with functional use , pain and stiffness in the morning and with some activities - had 2 flareups this past year - pt can benefit from OT services for joint protection and modifications educations, AE trng, AROM HEP , modalities to use and splint use    OT Occupational Profile and History Problem Focused Assessment - Including review of records relating to presenting problem    Occupational performance deficits (Please refer to evaluation for details): ADL's;IADL's;Leisure;Play;Social  Participation    Body Structure / Function / Physical Skills ADL;Edema;Flexibility;ROM;UE functional use;Pain;Strength;IADL    Rehab Potential Good    Clinical Decision Making Limited treatment options, no task modification necessary    Comorbidities Affecting Occupational Performance: May have comorbidities impacting occupational performance   OA of bilateral thumb CMC L worse than R, R 3rd   Modification or Assistance to Complete Evaluation  No modification of tasks or assist necessary to complete eval    OT Frequency 2x / week  OT Duration --   3 wks   OT Treatment/Interventions Self-care/ADL training;Ultrasound;Paraffin;Fluidtherapy;Contrast Bath;DME and/or AE instruction;Manual Therapy;Splinting;Patient/family education;Therapeutic exercise;Iontophoresis    Plan progress HEP -    OT Home Exercise Plan see pt instructions    Consulted and Agree with Plan of Care Patient           Patient will benefit from skilled therapeutic intervention in order to improve the following deficits and impairments:   Body Structure / Function / Physical Skills: ADL,Edema,Flexibility,ROM,UE functional use,Pain,Strength,IADL       Visit Diagnosis: Tendinitis of both hands - Plan: Ot plan of care cert/re-cert  Pain of left thumb - Plan: Ot plan of care cert/re-cert  Stiffness of right hand, not elsewhere classified - Plan: Ot plan of care cert/re-cert  Stiffness of left hand, not elsewhere classified - Plan: Ot plan of care cert/re-cert    Problem List Patient Active Problem List   Diagnosis Date Noted  . Pain and swelling of right upper extremity 06/05/2020  . Cervical disc disorder with radiculopathy of cervical region 05/24/2020  . Post inflammatory hypopigmentation 12/30/2019  . Claudication in peripheral vascular disease ([place]) 12/22/2019  . Arthritis of carpometacarpal [facility]) joint of left thumb 10/28/2019  . Hand arthritis 10/01/2019  . Statin myopathy 08/30/2019  . Muscle cramps  06/19/2019  . Hyperlipidemia 04/08/2019  . S/P CABG x 5 01/05/2019  . Degenerative joint disease of low back 01/09/2017  . Routine general medical examination at a health care facility 10/02/2016  . GERD (gastroesophageal reflux disease) 07/28/2016  . H/O malignant neoplasm of breast 06/19/2016    Rosalyn Gess OTR/L,CLT 10/24/2020, 10:58 AM  [place] [facility] [address] [place], [address] Phone: [phone]   Fax:  [phone]  Name: Addyson Read MRN: [id number] Date of Birth: May 14, 1951

## 2020-10-25 DIAGNOSIS — H04223 Epiphora due to insufficient drainage, bilateral lacrimal glands: Secondary | ICD-10-CM | POA: Diagnosis not present

## 2020-10-28 ENCOUNTER — Other Ambulatory Visit: Payer: Self-pay

## 2020-10-28 ENCOUNTER — Ambulatory Visit: Payer: Medicare PPO | Admitting: Occupational Therapy

## 2020-10-28 DIAGNOSIS — M778 Other enthesopathies, not elsewhere classified: Secondary | ICD-10-CM | POA: Diagnosis not present

## 2020-10-28 DIAGNOSIS — M25641 Stiffness of right hand, not elsewhere classified: Secondary | ICD-10-CM

## 2020-10-28 DIAGNOSIS — M79645 Pain in left finger(s): Secondary | ICD-10-CM

## 2020-10-28 DIAGNOSIS — M25642 Stiffness of left hand, not elsewhere classified: Secondary | ICD-10-CM | POA: Diagnosis not present

## 2020-10-28 NOTE — Therapy (Signed)
[place] [facility] [address], [place], [address] Phone: [phone]   Fax:  [phone]  Occupational Therapy Treatment  Patient Details  Name: Jessica [name] MRN: [id number] Date of Birth: [birth year] Referring Provider (OT): Dr Louanne Skye   Encounter Date: 10/28/2020   OT End of Session - 10/28/20 1045    Visit Number 2    Number of Visits 6    Date for OT Re-Evaluation 11/14/20    OT Start Time 0950    OT Stop Time 1032    OT Time Calculation (min) 42 min    Activity Tolerance Patient tolerated treatment well    Behavior During Therapy [facility] for tasks assessed/performed           Past Medical History:  Diagnosis Date  . Allergy   . Breast cancer ([place]) 2000  . Cancer [facility])    breast  . Colon polyp   . Coronary artery disease   . GERD (gastroesophageal reflux disease)   . History of blood transfusion   . Hyperlipidemia   . Migraine    history of migrarines, none as an adult  . Personal history of radiation therapy 2000    Past Surgical History:  Procedure Laterality Date  . ABDOMINAL HYSTERECTOMY    . BREAST LUMPECTOMY Right 2000  . BREAST SURGERY Right    calcifaction removed  . BUNIONECTOMY Bilateral   . COLONOSCOPY W/ POLYPECTOMY    . CORONARY ARTERY BYPASS GRAFT N/A 01/05/2019   Procedure: CORONARY ARTERY BYPASS GRAFTING (CABG), using right leg saphenous endoscopic and open vein harvest, exploration left leg;  Surgeon: Gaye Pollack, MD;  Location: [place] OR;  Service: Open Heart Surgery;  Laterality: N/A;  . EYE SURGERY Bilateral    cataract  . hemorrhoid    . LEFT HEART CATH AND CORONARY ANGIOGRAPHY N/A 01/01/2019   Procedure: LEFT HEART CATH AND CORONARY ANGIOGRAPHY;  Surgeon: Jettie Booze, MD;  Location: [place] [facility];  Service: Cardiovascular;  Laterality: N/A;  . STERNAL WIRES REMOVAL N/A 05/11/2019   Procedure: STERNAL WIRES REMOVAL;  Surgeon: Gaye Pollack, MD;   Location: [place];  Service: Thoracic;  Laterality: N/A;  . TEE WITHOUT CARDIOVERSION N/A 01/05/2019   Procedure: TRANSESOPHAGEAL ECHOCARDIOGRAM (TEE);  Surgeon: Gaye Pollack, MD;  Location: [place];  Service: Open Heart Surgery;  Laterality: N/A;    There were no vitals filed for this visit.   Subjective Assessment - 10/28/20 1043    Subjective  DOing better - I can tell my hands are better- do not have tenderness in the palm - done the ice massage,and trying to remember to use larger joints and built up handles -fingers are stiff in the am - then they hurt when I bend them -    Pertinent History Pt has history of OA in bilateral thumb CMC - L worse than R , and R 3rd digit - pt had old ORIF of L 2nd digit - cannot flex PIP- seen Dr Fredna Dow in 08/21 , thumb splint fabricated for L thumb CMC - but pt report cannot wear it - restricting her - She had increase pain the last month - seen Dr Louanne Skye in [place] - got shots in bilateral 5th digit A1pulley - helped for pain - refer to OT    Patient Stated Goals I want the pain better and to prevent it from happening again- if you can help me    Currently in Pain?  Yes    Pain Score 6     Pain Location Hand    Pain Orientation Right;Left    Pain Descriptors / Indicators Aching    Pain Type Chronic pain;Acute pain    Pain Onset More than a month ago    Pain Frequency Intermittent    Aggravating Factors  making intrinsic fist              [facility] OT Assessment - 10/28/20 0001      Right Hand AROM   R Index  MCP 0-90 80 Degrees    R Index PIP 0-100 100 Degrees    R Long  MCP 0-90 85 Degrees    R Long PIP 0-100 100 Degrees    R Ring  MCP 0-90 85 Degrees    R Ring PIP 0-100 100 Degrees    R Little  MCP 0-90 85 Degrees    R Little PIP 0-100 95 Degrees      Left Hand AROM   L Index  MCP 0-90 90 Degrees    L Index PIP 0-100 0 Degrees    L Long  MCP 0-90 90 Degrees    L Long PIP 0-100 100 Degrees    L Ring  MCP 0-90 90 Degrees    L Ring PIP  0-100 100 Degrees    L Little  MCP 0-90 90 Degrees    L Little PIP 0-100 95 Degrees           increase AROM in MC and PIP flexion- no tenderness over A1pulley's - no edema over MC's this date  Pt arrive with CMC neoprene splint on L thumb - no pain using it during day - some pain at night per pt with pulling up covers   pt can use it if needed at night time          OT Treatments/Exercises (OP) - 10/28/20 0001      RUE Paraffin   Number Minutes Paraffin 8 Minutes    RUE Paraffin Location Hand   wrist   Comments prior to soft tissue and AROM      LUE Paraffin   Number Minutes Paraffin 8 Minutes    LUE Paraffin Location Hand;Wrist    Comments prior to soft tissue and AROM            Review with pt when to use moist heat and when Contrast on bilateral hands Am and pm-  Info provided on paraffin bath   Light pain free tendon glides  10 reps Opposition to all digits = keeping 0 reinforce - min A for HEP review correctly  5 reps   Joint protection principles review and hand out provided last time - pt report using them  CMC neoprene splint fitted last time  - had 7 lbs increase lat grip and less pain  Use with activities that cause pain and if needed night tinme  [facility] ice massage over A1 pulley's of bilateral 5th and R 2nd- several times day since last time  No tenderness this date  Pt to cont at home and return for follow up if needed in 2-3 wks        OT Education - 10/28/20 1045    Education Details progress and changes to HEP    Person(s) Educated Patient    Methods Explanation;Demonstration;Tactile cues;Verbal cues;Handout    Comprehension Verbal cues required;Returned demonstration;Verbalized understanding            OT Short Term  Goals - 10/28/20 1306      OT SHORT TERM GOAL #1   Title Pt to be independent in use of correct splint to decrease pain and increase lat grip in L thumb    Baseline No pain during day with use of CMC splint -less pain  and increase lat grip wtih splint    Period Weeks    Status Achieved    Target Date 10/28/20             OT Long Term Goals - 10/28/20 1307      OT LONG TERM GOAL #1   Title Pt report pain less than 3/10 in bilateral hands over [place] and A1pulley's to maintain and improve flexion /extention of digits    Baseline decrease R MC end range flexion and extention , pain and edema over 2nd and 3rd MC mostly - and A1pulley's of bilateral 5th 's and R 2nd- NOW no tenderness over A1pulleys- increase AROM in PIP and MC to close to WNL - but stiffness in PIP duirng intrinsic fist without moist heat    Time 3    Period Weeks    Status On-going    Target Date 11/14/20      OT LONG TERM GOAL #2   Title Pt to verbalize and demo 3 joint protection and AE to increase ease in ADL's and decrease pain    Baseline using larger joints - enlarge grips- and uisng builtup utencils at home - light up on tight grip    Status Achieved                 Plan - 10/28/20 1046    Clinical Impression Statement Pt this date wtih no tenderness over A1pulley's at R 2nd and bilateral 5th - less pain in thumb and only some stiffness and pain in PIP during intrinsic fist prior to paraffin - did not show edema over MC's - pt done great after moist heat - paraffin - no pain and increase AROM coming in this date close to WNL - Recommend for pt to do moist heat if not flare up or edema- otherwise contrast - pt to cont HEP and return in 2-3 wks for follow up - cont joint protection and CMC neoprene splint on L thumb    OT Occupational Profile and History Problem Focused Assessment - Including review of records relating to presenting problem    Occupational performance deficits (Please refer to evaluation for details): ADL's;IADL's;Leisure;Play;Social Participation    Body Structure / Function / Physical Skills ADL;Edema;Flexibility;ROM;UE functional use;Pain;Strength;IADL    Rehab Potential Good    Clinical Decision Making  Limited treatment options, no task modification necessary    Comorbidities Affecting Occupational Performance: May have comorbidities impacting occupational performance    Modification or Assistance to Complete Evaluation  No modification of tasks or assist necessary to complete eval    OT Frequency Biweekly    OT Duration 2 weeks   3 wks   OT Treatment/Interventions Self-care/ADL training;Ultrasound;Paraffin;Fluidtherapy;Contrast Bath;DME and/or AE instruction;Manual Therapy;Splinting;Patient/family education;Therapeutic exercise;Iontophoresis    Plan progress HEP -    OT Home Exercise Plan see pt instructions    Consulted and Agree with Plan of Care Patient           Patient will benefit from skilled therapeutic intervention in order to improve the following deficits and impairments:   Body Structure / Function / Physical Skills: ADL,Edema,Flexibility,ROM,UE functional use,Pain,Strength,IADL       Visit Diagnosis: Tendinitis of both hands  Pain of left thumb  Stiffness of right hand, not elsewhere classified  Stiffness of left hand, not elsewhere classified    Problem List Patient Active Problem List   Diagnosis Date Noted  . Pain and swelling of right upper extremity 06/05/2020  . Cervical disc disorder with radiculopathy of cervical region 05/24/2020  . Post inflammatory hypopigmentation 12/30/2019  . Claudication in peripheral vascular disease ([place]) 12/22/2019  . Arthritis of carpometacarpal [facility]) joint of left thumb 10/28/2019  . Hand arthritis 10/01/2019  . Statin myopathy 08/30/2019  . Muscle cramps 06/19/2019  . Hyperlipidemia 04/08/2019  . S/P CABG x 5 01/05/2019  . Degenerative joint disease of low back 01/09/2017  . Routine general medical examination at a health care facility 10/02/2016  . GERD (gastroesophageal reflux disease) 07/28/2016  . H/O malignant neoplasm of breast 06/19/2016    Jessica [name] OTR/L,CLT 10/28/2020, 1:11 PM  [place] [facility] [address] [place], [address] Phone: [phone]   Fax:  [phone]  Name: Jessica [name] MRN: [id number] Date of Birth: Aug 25, 1951

## 2020-11-08 ENCOUNTER — Telehealth: Payer: Self-pay | Admitting: Cardiovascular Disease

## 2020-11-08 NOTE — Telephone Encounter (Signed)
Spoke with pt, she states that she thought she needed to have labs drawn per Dr. Gwenlyn Found.  Per review of chart pt had labs done 09/21/20.  Pt's PCP ordered CBC, lipids and CMET. Pt states that she didn't receive a call or message about her labs done as that time. Per Dr. Yolande Jolly message labs were within normal limits and no changes to be made. Explained to pt that these labs are viewable by Dr. Gwenlyn Found and there is no need to have any additional labs drawn at this time. Pt verbalizes understanding and is appreciative of the phone call.

## 2020-11-08 NOTE — Telephone Encounter (Signed)
Patient states she was supposed to get lab work done to check her cholesteral last week but did not. I did not see any lab orders. She would like a call back when the orders are put in.

## 2020-11-14 ENCOUNTER — Ambulatory Visit
Admission: RE | Admit: 2020-11-14 | Discharge: 2020-11-14 | Disposition: A | Discharge status: Home / Self Care | Payer: Medicare PPO | Source: Ambulatory Visit | Attending: Internal Medicine | Admitting: Internal Medicine

## 2020-11-14 ENCOUNTER — Other Ambulatory Visit: Payer: Self-pay

## 2020-11-14 DIAGNOSIS — Z1231 Encounter for screening mammogram for malignant neoplasm of breast: Secondary | ICD-10-CM

## 2020-11-23 ENCOUNTER — Ambulatory Visit: Payer: Medicare PPO | Attending: Orthopedic Surgery | Admitting: Occupational Therapy

## 2020-11-23 ENCOUNTER — Other Ambulatory Visit: Payer: Self-pay

## 2020-11-23 DIAGNOSIS — M25641 Stiffness of right hand, not elsewhere classified: Secondary | ICD-10-CM | POA: Diagnosis not present

## 2020-11-23 DIAGNOSIS — M778 Other enthesopathies, not elsewhere classified: Secondary | ICD-10-CM | POA: Diagnosis not present

## 2020-11-23 DIAGNOSIS — M79645 Pain in left finger(s): Secondary | ICD-10-CM | POA: Diagnosis not present

## 2020-11-23 DIAGNOSIS — M25642 Stiffness of left hand, not elsewhere classified: Secondary | ICD-10-CM | POA: Diagnosis not present

## 2020-11-23 NOTE — Therapy (Signed)
[facility] [address] [place], [address] Phone: [phone]   Fax:  [phone]  Occupational Therapy Treatment  Patient Details  Name: Jessica [name] MRN: [id number] Date of Birth: 06/27/1951 Referring Provider (OT): Dr Louanne Skye   Encounter Date: 11/23/2020   OT End of Session - 11/23/20 1429    Visit Number 3    Number of Visits 4    Date for OT Re-Evaluation 11/23/20    OT Start Time 1404    OT Stop Time 1424    OT Time Calculation (min) 20 min    Activity Tolerance Patient tolerated treatment well    Behavior During Therapy [facility] for tasks assessed/performed           Past Medical History:  Diagnosis Date  . Allergy   . Breast cancer ([place]) 2000  . Cancer [facility])    breast  . Colon polyp   . Coronary artery disease   . GERD (gastroesophageal reflux disease)   . History of blood transfusion   . Hyperlipidemia   . Migraine    history of migrarines, none as an adult  . Personal history of radiation therapy 2000    Past Surgical History:  Procedure Laterality Date  . ABDOMINAL HYSTERECTOMY    . BREAST LUMPECTOMY Right 2000  . BREAST SURGERY Right    calcifaction removed  . BUNIONECTOMY Bilateral   . COLONOSCOPY W/ POLYPECTOMY    . CORONARY ARTERY BYPASS GRAFT N/A 01/05/2019   Procedure: CORONARY ARTERY BYPASS GRAFTING (CABG), using right leg saphenous endoscopic and open vein harvest, exploration left leg;  Surgeon: Gaye Pollack, MD;  Location: [place] OR;  Service: Open Heart Surgery;  Laterality: N/A;  . EYE SURGERY Bilateral    cataract  . hemorrhoid    . LEFT HEART CATH AND CORONARY ANGIOGRAPHY N/A 01/01/2019   Procedure: LEFT HEART CATH AND CORONARY ANGIOGRAPHY;  Surgeon: Jettie Booze, MD;  Location: [place] CV LAB;  Service: Cardiovascular;  Laterality: N/A;  . STERNAL WIRES REMOVAL N/A 05/11/2019   Procedure: STERNAL WIRES REMOVAL;  Surgeon: Gaye Pollack, MD;   Location: [place];  Service: Thoracic;  Laterality: N/A;  . TEE WITHOUT CARDIOVERSION N/A 01/05/2019   Procedure: TRANSESOPHAGEAL ECHOCARDIOGRAM (TEE);  Surgeon: Gaye Pollack, MD;  Location: [place];  Service: Open Heart Surgery;  Laterality: N/A;    There were no vitals filed for this visit.   Subjective Assessment - 11/23/20 1428    Subjective  I am doing great - thank you so much - I don't have any pain , tenderness - and using my hands in everything but I am doing like you told me - built up handles , using my palms to carry or lift things - loosen up on my grip - I found that I grip things really tight    Pertinent History Pt has history of OA in bilateral thumb CMC - L worse than R , and R 3rd digit - pt had old ORIF of L 2nd digit - cannot flex PIP- seen Dr Fredna Dow in 08/21 , thumb splint fabricated for L thumb CMC - but pt report cannot wear it - restricting her - She had increase pain the last month - seen Dr Louanne Skye in [place] - got shots in bilateral 5th digit A1pulley - helped for pain - refer to OT    Patient Stated Goals I want the pain better and to prevent it from happening  again- if you can help me    Currently in Pain? No/denies              [facility] OT Assessment - 11/23/20 0001      Strength   Right Hand Grip (lbs) 65    Right Hand Lateral Pinch 20 lbs    Right Hand 3 Point Pinch 17 lbs    Left Hand Grip (lbs) 55    Left Hand Lateral Pinch 14 lbs    Left Hand 3 Point Pinch 12 lbs      Right Hand AROM   R Thumb Opposition to Index --   opposition WNL   R Index  MCP 0-90 80 Degrees    R Index PIP 0-100 100 Degrees    R Long  MCP 0-90 85 Degrees    R Long PIP 0-100 100 Degrees    R Ring  MCP 0-90 90 Degrees    R Ring PIP 0-100 100 Degrees    R Little  MCP 0-90 85 Degrees    R Little PIP 0-100 100 Degrees      Left Hand AROM   L Thumb Opposition to Index --   Oppostion WNL   L Index  MCP 0-90 90 Degrees    L Index PIP 0-100 0 Degrees    L Long  MCP 0-90 90  Degrees    L Long PIP 0-100 100 Degrees    L Ring  MCP 0-90 90 Degrees    L Ring PIP 0-100 100 Degrees    L Little  MCP 0-90 90 Degrees    L Little PIP 0-100 100 Degrees            Measurement taken in bilateral hands - AROM WNL except L 2nd PIP old ORIF No tenderness over A1pulleys -no pain  No pain with thumb with PA and RA - flexion - pt report not using CMC neoprene splint the last 2-3 wks     Review with pt  Again when to use moist heat and when Contrast on bilateral hands Am and pm-  Info provided on paraffin bath last time  Light pain free tendon glides  10 reps Opposition to all digits = keeping 0 reinforce   Joint protection principles review and hand out provided at eval - pt report using them  Cont to use CMC neoprene splint with activities that cause pain and if needed night tinme    Pt to contact me if needed in 2-3 wks othe wise will discharge her             OT Education - 11/23/20 1429    Education Details progress and review HEP and joint protection    Person(s) Educated Patient    Methods Explanation;Demonstration;Tactile cues;Verbal cues;Handout    Comprehension Verbal cues required;Returned demonstration;Verbalized understanding            OT Short Term Goals - 11/23/20 1434      OT SHORT TERM GOAL #1   Title Pt to be independent in use of correct splint to decrease pain and increase lat grip in L thumb    Baseline used the first 2 wks CMC neoprene on R thumb - but not the last 2-3 wks per pt - has it if needed    Status Achieved             OT Long Term Goals - 11/23/20 1434      OT LONG TERM GOAL #1  Title Pt report pain less than 3/10 in bilateral hands over MC and A1pulley's to maintain and improve flexion /extention of digits    Baseline AROM WNL - no pain or tenderness - all digits except L 2nd PIP - old ORIF    Status Achieved      OT LONG TERM GOAL #2   Title Pt to verbalize and demo 3 joint protection and AE to  increase ease in ADL's and decrease pain    Baseline implement joint protection -and electric can opener , insulation tubing enlarge tools - use palms instead of grip    Status Achieved                 Plan - 11/23/20 1430    Clinical Impression Statement Pt refer to OT for pain and tenderness over  R A1pulleys on 2nd and 5th , L 5th and thumb CMC - pt show great progress - AROM WNL except L 2nd PIP - old ORIF - and grip and prehension strength WNL for her age and no pain - Pt using joint protection in ADL's and IADL's as well as HEP using modalities as needed and AROM - pt to call me if needed in the next 2-3 wks - other wise will discharge her    OT Occupational Profile and History Problem Focused Assessment - Including review of records relating to presenting problem    Occupational performance deficits (Please refer to evaluation for details): ADL's;IADL's;Leisure;Play;Social Participation    Body Structure / Function / Physical Skills ADL;Edema;Flexibility;ROM;UE functional use;Pain;Strength;IADL    Rehab Potential Good    Clinical Decision Making Limited treatment options, no task modification necessary    Comorbidities Affecting Occupational Performance: May have comorbidities impacting occupational performance    Modification or Assistance to Complete Evaluation  No modification of tasks or assist necessary to complete eval    OT Frequency Biweekly    OT Duration 2 weeks    OT Treatment/Interventions Self-care/ADL training;Ultrasound;Paraffin;Fluidtherapy;Contrast Bath;DME and/or AE instruction;Manual Therapy;Splinting;Patient/family education;Therapeutic exercise;Iontophoresis    Plan call me in next 2-3 wks if need otherwise will discharge her    Consulted and Agree with Plan of Care Patient           Patient will benefit from skilled therapeutic intervention in order to improve the following deficits and impairments:   Body Structure / Function / Physical Skills:  ADL,Edema,Flexibility,ROM,UE functional use,Pain,Strength,IADL       Visit Diagnosis: Tendinitis of both hands - Plan: Ot plan of care cert/re-cert  Pain of left thumb - Plan: Ot plan of care cert/re-cert  Stiffness of right hand, not elsewhere classified - Plan: Ot plan of care cert/re-cert  Stiffness of left hand, not elsewhere classified - Plan: Ot plan of care cert/re-cert    Problem List Patient Active Problem List   Diagnosis Date Noted  . Pain and swelling of right upper extremity 06/05/2020  . Cervical disc disorder with radiculopathy of cervical region 05/24/2020  . Post inflammatory hypopigmentation 12/30/2019  . Claudication in peripheral vascular disease ([place]) 12/22/2019  . Arthritis of carpometacarpal [facility]) joint of left thumb 10/28/2019  . Hand arthritis 10/01/2019  . Statin myopathy 08/30/2019  . Muscle cramps 06/19/2019  . Hyperlipidemia 04/08/2019  . S/P CABG x 5 01/05/2019  . Degenerative joint disease of low back 01/09/2017  . Routine general medical examination at a health care facility 10/02/2016  . GERD (gastroesophageal reflux disease) 07/28/2016  . H/O malignant neoplasm of breast 06/19/2016    Gerado Nabers,  Sumaiyah Markert OTR/L,CLT 11/23/2020, 2:38 PM  [facility] [address], [place], [address] Phone: [phone]   Fax:  [phone]  Name: Lugenia Assefa MRN: [id number] Date of Birth: 06-25-1951

## 2020-12-13 DIAGNOSIS — H524 Presbyopia: Secondary | ICD-10-CM | POA: Diagnosis not present

## 2020-12-13 DIAGNOSIS — H04213 Epiphora due to excess lacrimation, bilateral lacrimal glands: Secondary | ICD-10-CM | POA: Diagnosis not present

## 2020-12-13 DIAGNOSIS — H1045 Other chronic allergic conjunctivitis: Secondary | ICD-10-CM | POA: Diagnosis not present

## 2020-12-13 DIAGNOSIS — H16223 Keratoconjunctivitis sicca, not specified as Sjogren's, bilateral: Secondary | ICD-10-CM | POA: Diagnosis not present

## 2020-12-13 DIAGNOSIS — H40013 Open angle with borderline findings, low risk, bilateral: Secondary | ICD-10-CM | POA: Diagnosis not present

## 2021-01-02 ENCOUNTER — Emergency Department
Admission: EM | Admit: 2021-01-02 | Discharge: 2021-01-02 | Disposition: A | Discharge status: Home / Self Care | Payer: Medicare PPO | Attending: Emergency Medicine | Admitting: Emergency Medicine

## 2021-01-02 ENCOUNTER — Emergency Department: Payer: Medicare PPO

## 2021-01-02 ENCOUNTER — Other Ambulatory Visit: Payer: Self-pay

## 2021-01-02 DIAGNOSIS — I2510 Atherosclerotic heart disease of native coronary artery without angina pectoris: Secondary | ICD-10-CM | POA: Insufficient documentation

## 2021-01-02 DIAGNOSIS — Z853 Personal history of malignant neoplasm of breast: Secondary | ICD-10-CM | POA: Insufficient documentation

## 2021-01-02 DIAGNOSIS — M25521 Pain in right elbow: Secondary | ICD-10-CM | POA: Diagnosis not present

## 2021-01-02 DIAGNOSIS — Z7982 Long term (current) use of aspirin: Secondary | ICD-10-CM | POA: Insufficient documentation

## 2021-01-02 DIAGNOSIS — M79601 Pain in right arm: Secondary | ICD-10-CM | POA: Diagnosis not present

## 2021-01-02 DIAGNOSIS — R001 Bradycardia, unspecified: Secondary | ICD-10-CM | POA: Diagnosis not present

## 2021-01-02 DIAGNOSIS — Z951 Presence of aortocoronary bypass graft: Secondary | ICD-10-CM | POA: Insufficient documentation

## 2021-01-02 LAB — COMPREHENSIVE METABOLIC PANEL
ALT: 16 U/L (ref 0–44)
AST: 28 U/L (ref 15–41)
Albumin: 4.3 g/dL (ref 3.5–5.0)
Alkaline Phosphatase: 44 U/L (ref 38–126)
Anion gap: 6 (ref 5–15)
BUN: 11 mg/dL (ref 8–23)
CO2: 24 mmol/L (ref 22–32)
Calcium: 9.3 mg/dL (ref 8.9–10.3)
Chloride: 109 mmol/L (ref 98–111)
Creatinine, Ser: 0.97 mg/dL (ref 0.44–1.00)
GFR, Estimated: >60 mL/min (ref >60)
Glucose, Bld: 90 mg/dL (ref 70–99)
Potassium: 3.6 mmol/L (ref 3.5–5.1)
Sodium: 139 mmol/L (ref 135–145)
Total Bilirubin: 0.7 mg/dL (ref 0.3–1.2)
Total Protein: 7.4 g/dL (ref 6.5–8.1)

## 2021-01-02 LAB — CBC WITH DIFFERENTIAL/PLATELET
Abs Immature Granulocytes: 0.01 10*3/uL (ref 0.00–0.07)
Basophils Absolute: 0 10*3/uL (ref 0.0–0.1)
Basophils Relative: 1 %
Eosinophils Absolute: 0.1 10*3/uL (ref 0.0–0.5)
Eosinophils Relative: 3 %
HCT: 39.4 % (ref 36.0–46.0)
Hemoglobin: 12.7 g/dL (ref 12.0–15.0)
Immature Granulocytes: 0 %
Lymphocytes Relative: 44 %
Lymphs Abs: 2.1 10*3/uL (ref 0.7–4.0)
MCH: 29 pg (ref 26.0–34.0)
MCHC: 32.2 g/dL (ref 30.0–36.0)
MCV: 90 fL (ref 80.0–100.0)
Monocytes Absolute: 0.4 10*3/uL (ref 0.1–1.0)
Monocytes Relative: 9 %
Neutro Abs: 2 10*3/uL (ref 1.7–7.7)
Neutrophils Relative %: 43 %
Platelets: 265 10*3/uL (ref 150–400)
RBC: 4.38 MIL/uL (ref 3.87–5.11)
RDW: 12.7 % (ref 11.5–15.5)
WBC: 4.6 10*3/uL (ref 4.0–10.5)
nRBC: 0 % (ref 0.0–0.2)

## 2021-01-02 LAB — TROPONIN I (HIGH SENSITIVITY): Troponin I (High Sensitivity): 6 ng/L (ref <18)

## 2021-01-02 MED ORDER — PREDNISONE 10 MG (21) PO TBPK: ORAL_TABLET | ORAL | 0 refills | Status: DC

## 2021-01-02 NOTE — ED Triage Notes (Signed)
Pt comes with c/o right arm pain. Pt states she did lift some heavy boxes recently and not sure if the pain is from that. Pt states it is achy. Pt states she has tried ice pack with no relief.

## 2021-01-02 NOTE — ED Notes (Signed)
Patient transported to X-ray 

## 2021-01-02 NOTE — Discharge Instructions (Signed)
Take tapered steroid as directed.  

## 2021-01-02 NOTE — ED Notes (Signed)
See triage note  Presents with right arm pain  States she lifted heavy cases of water on Thursday  Developed pain on Friday  No deformity noted  Good pulses

## 2021-01-02 NOTE — ED Provider Notes (Signed)
[facility]-EMERGENCY DEPARTMENT  ____________________________________________  Time seen: Approximately 3:43 PM  I have reviewed the triage vital signs and the nursing notes.   HISTORY  Chief Complaint Arm Pain   Historian Patient     HPI Jessica [name] is a 70 y.o. female presents to the emergency department with right upper extremity pain.  Patient states that right arm pain goes from the right anterior biceps to the elbow does not radiate to the hand.  Patient reports that range of motion at the neck does not reproduce symptoms.  She states that she has experienced symptoms intermittently for the past several years.  Patient is concerned because she had open heart surgery 2 years ago and prodrome to chest pain months left arm pain.  Patient states that she had some fleeting midsternal chest pain that has since resolved.  No shortness of breath or chest tightness.  Patient denies falls or mechanisms of trauma.  She states that she recently lifted a case of heavy water bottles and pain occurred after lifting.   Past Medical History:  Diagnosis Date  . Allergy   . Breast cancer ([place]) 2000  . Cancer [facility])    breast  . Colon polyp   . Coronary artery disease   . GERD (gastroesophageal reflux disease)   . History of blood transfusion   . Hyperlipidemia   . Migraine    history of migrarines, none as an adult  . Personal history of radiation therapy 2000     Immunizations up to date:  Yes.     Past Medical History:  Diagnosis Date  . Allergy   . Breast cancer ([place]) 2000  . Cancer [facility])    breast  . Colon polyp   . Coronary artery disease   . GERD (gastroesophageal reflux disease)   . History of blood transfusion   . Hyperlipidemia   . Migraine    history of migrarines, none as an adult  . Personal history of radiation therapy 2000    Patient Active Problem List   Diagnosis Date Noted  . Pain and swelling of right upper extremity 06/05/2020  . Cervical disc  disorder with radiculopathy of cervical region 05/24/2020  . Post inflammatory hypopigmentation 12/30/2019  . Claudication in peripheral vascular disease ([place]) 12/22/2019  . Arthritis of carpometacarpal [facility]) joint of left thumb 10/28/2019  . Hand arthritis 10/01/2019  . Statin myopathy 08/30/2019  . Muscle cramps 06/19/2019  . Hyperlipidemia 04/08/2019  . S/P CABG x 5 01/05/2019  . Degenerative joint disease of low back 01/09/2017  . Routine general medical examination at a health care facility 10/02/2016  . GERD (gastroesophageal reflux disease) 07/28/2016  . H/O malignant neoplasm of breast 06/19/2016    Past Surgical History:  Procedure Laterality Date  . ABDOMINAL HYSTERECTOMY    . BREAST LUMPECTOMY Right 2000  . BREAST SURGERY Right    calcifaction removed  . BUNIONECTOMY Bilateral   . COLONOSCOPY W/ POLYPECTOMY    . CORONARY ARTERY BYPASS GRAFT N/A 01/05/2019   Procedure: CORONARY ARTERY BYPASS GRAFTING (CABG), using right leg saphenous endoscopic and open vein harvest, exploration left leg;  Surgeon: Gaye Pollack, MD;  Location: [place] OR;  Service: Open Heart Surgery;  Laterality: N/A;  . EYE SURGERY Bilateral    cataract  . hemorrhoid    . LEFT HEART CATH AND CORONARY ANGIOGRAPHY N/A 01/01/2019   Procedure: LEFT HEART CATH AND CORONARY ANGIOGRAPHY;  Surgeon: Jettie Booze, MD;  Location: [place] CV LAB;  Service: Cardiovascular;  Laterality: N/A;  . STERNAL WIRES REMOVAL N/A 05/11/2019   Procedure: STERNAL WIRES REMOVAL;  Surgeon: Gaye Pollack, MD;  Location: [place];  Service: Thoracic;  Laterality: N/A;  . TEE WITHOUT CARDIOVERSION N/A 01/05/2019   Procedure: TRANSESOPHAGEAL ECHOCARDIOGRAM (TEE);  Surgeon: Gaye Pollack, MD;  Location: [place];  Service: Open Heart Surgery;  Laterality: N/A;    Prior to Admission medications   Medication Sig Start Date End Date Taking? Authorizing Provider  predniSONE (STERAPRED UNI-PAK 21 TAB) 10 MG (21) TBPK tablet  6,5,4,3,2,1 01/02/21  Yes Woods, Ellison Hughs M, PA-C  Alirocumab (PRALUENT) 75 MG/ML SOAJ Inject 75 mg into the skin every 14 (fourteen) days. 08/09/20   Lorretta Harp, MD  Ascorbic Acid (VITAMIN C) 1000 MG tablet Take 2,000 mg by mouth daily.    [provider]  aspirin EC 81 MG tablet Take 1 tablet (81 mg total) by mouth daily. Swallow whole. 08/04/20   Lorretta Harp, MD  Cholecalciferol (VITAMIN D-3) 125 MCG (5000 UT) TABS Take 5,000 Units by mouth daily.    [provider]  loratadine (CLARITIN) 10 MG tablet Take 10 mg by mouth every evening.     [provider]  metoprolol tartrate (LOPRESSOR) 25 MG tablet Take 0.5 tablets (12.5 mg total) by mouth daily. 12/22/19   Lorretta Harp, MD  Multiple Vitamin (MULTIVITAMIN WITH MINERALS) TABS tablet Take 1 tablet by mouth daily.    [provider]    Allergies Codeine, Repatha [evolocumab], Sulfa antibiotics, and Sulfacetamide  Family History  Problem Relation Age of Onset  . Heart disease Brother   . Asthma Paternal Aunt   . Diabetes Paternal Aunt   . Stroke Paternal Uncle   . Kidney disease Paternal Uncle     Social History Social History   Tobacco Use  . Smoking status: Never Smoker  . Smokeless tobacco: Never Used  Vaping Use  . Vaping Use: Never used  Substance Use Topics  . Alcohol use: No  . Drug use: No     Review of Systems  Constitutional: No fever/chills Eyes:  No discharge ENT: No upper respiratory complaints. Respiratory: no cough. No SOB/ use of accessory muscles to breath Gastrointestinal:   No nausea, no vomiting.  No diarrhea.  No constipation. Musculoskeletal: Patient has right arm pain.  Skin: Negative for rash, abrasions, lacerations, ecchymosis.    ____________________________________________   PHYSICAL EXAM:  VITAL SIGNS: ED Triage Vitals  Enc Vitals Group     BP 01/02/21 1500 139/90     Pulse Rate 01/02/21 1500 (!) 59     Resp 01/02/21 1500 18      Temp 01/02/21 1500 98.6 F (37 C)     Temp Source 01/02/21 1500 Oral     SpO2 01/02/21 1500 99 %     Weight 01/02/21 1459 192 lb (87.1 kg)     Height 01/02/21 1459 5\' 6"  (1.676 m)     Head Circumference --      Peak Flow --      Pain Score 01/02/21 1459 6     Pain Loc --      Pain Edu? --      Excl. in [place]? --      Constitutional: Alert and oriented. Well appearing and in no acute distress. Eyes: Conjunctivae are normal. PERRL. EOMI. Head: Atraumatic. ENT:      Nose: No congestion/rhinnorhea.      Mouth/Throat: Mucous membranes are moist.  Neck: No  stridor.  Full range of motion.  No midline C-spine tenderness to palpation. Cardiovascular: Normal rate, regular rhythm. Normal S1 and S2.  Good peripheral circulation. Respiratory: Normal respiratory effort without tachypnea or retractions. Lungs CTAB. Good air entry to the bases with no decreased or absent breath sounds Gastrointestinal: Bowel sounds x 4 quadrants. Soft and nontender to palpation. No guarding or rigidity. No distention. Musculoskeletal: Full range of motion to all extremities. No obvious deformities noted.  Patient has reproducible pain with biceps tendon testing on the right.  Palpable radial and ulnar pulses on the right.  Capillary refill is less than 2 seconds on the right. Neurologic:  Normal for age. No gross focal neurologic deficits are appreciated.  Skin:  Skin is warm, dry and intact. No rash noted. Psychiatric: Mood and affect are normal for age. Speech and behavior are normal.   ____________________________________________   LABS (all labs ordered are listed, but only abnormal results are displayed)  Labs Reviewed  CBC WITH DIFFERENTIAL/PLATELET  COMPREHENSIVE METABOLIC PANEL  TROPONIN I (HIGH SENSITIVITY)   ____________________________________________  EKG   ____________________________________________  RADIOLOGY Unk Pinto, personally viewed and evaluated these images (plain  radiographs) as part of my medical decision making, as well as reviewing the written report by the radiologist.    DG Chest 2 View  Result Date: 01/02/2021 CLINICAL DATA:  Right arm pain. EXAM: CHEST - 2 VIEW COMPARISON:  May 06, 2019 FINDINGS: The sternal wires seen on the prior study have been removed. Small radiopaque vascular clips are seen. The heart size and mediastinal contours are within normal limits. Both lungs are clear. The visualized skeletal structures are unremarkable. IMPRESSION: No active cardiopulmonary disease. Electronically Signed   By: Virgina Norfolk M.D.   On: 01/02/2021 16:19    ____________________________________________    PROCEDURES  Procedure(s) performed:     Procedures     Medications - No data to display   ____________________________________________   INITIAL IMPRESSION / ASSESSMENT AND PLAN / ED COURSE  Pertinent labs & imaging results that were available during my care of the patient were reviewed by me and considered in my medical decision making (see chart for details).      Assessment and Plan: Arm pain:  70 year old female presents to the emergency department with right upper extremity pain.  Patient was bradycardic at triage vital signs were otherwise reassuring.  Patient had reproducible pain with right biceps tendon testing.  CBC and CMP were reassuring.  Troponin was within reference range.  EKG indicated sinus bradycardia but no ST segment elevation or other apparent arrhythmia.  Patient states that she has experienced this pain in the past and has responded well to tapered prednisone.  Patient was discharged with prednisone.  She was advised to follow-up with orthopedics as needed.  Return precautions were given to return with new or worsening symptoms.    ____________________________________________  FINAL CLINICAL IMPRESSION(S) / ED DIAGNOSES  Final diagnoses:  Right arm pain      NEW MEDICATIONS STARTED  DURING THIS VISIT:  ED Discharge Orders         Ordered    predniSONE (STERAPRED UNI-PAK 21 TAB) 10 MG (21) TBPK tablet        01/02/21 1644              This chart was dictated using voice recognition software/Dragon. Despite best efforts to proofread, errors can occur which can change the meaning. Any change was purely unintentional. Sherral Hammers, Ellison Hughs  Curt Jews 01/02/21 1652    Arta Silence, MD 01/02/21 1952

## 2021-01-13 NOTE — Progress Notes (Signed)
I, Jessica [name], LAT, ATC, am serving as scribe for Dr. Lynne Leader.  Jessica [name] is a 70 y.o. female who presents to [facility] today for f/u of R arm pain for which she was seen at the [facility] ED on 01/02/21.  She was last seen by Dr. Georgina Snell on 06/22/20 for L shoulder pain.  Since then, pt reports R upper arm pain from the proximal biceps to the elbow after lifting a case of bottled water and was prescribed a prednisone dose pack at the ED. Of note, pt had open heart surgery 2 years ago, which was the primary focus at the ED. Pt locates pain to R biceps w/ increased pain w/ horizontal ABD, and ABD.   Neck pain: No R UE numbness/tingling: No Aggravating factors: shoulder ABD, horizontal ABD Treatments tried: prednisone dose pack, Tylenol  Diagnostic imaging: R and L shoulder XR- 06/03/20; C-spine XR- 05/24/20  Pertinent review of systems: No fevers or chills  Relevant historical information: Statin myopathy.  CMC DJD.  CABG.  History of breast cancer   Exam:  BP 128/84 (BP Location: Left Arm, Patient Position: Sitting, Cuff Size: Normal)   Pulse (!) 55   Ht 5\' 6"  (1.676 m)   Wt 189 lb 3.2 oz (85.8 kg)   SpO2 98%   BMI 30.54 kg/m  General: Well Developed, well nourished, and in no acute distress.   MSK: Right shoulder normal-appearing Nontender. Decreased shoulder motion to abduction and internal rotation. Strength 4/5 to abduction external rotation 5/5 internal rotation.   Positive Hawkins and Neer's test Positive empty can test. Negative Yergason's and speeds test. Pulses capillary refill and sensation are intact distally.   Lab and Radiology Results  Procedure: Real-time Ultrasound Guided Injection of right shoulder subacromial bursa Device: Philips Affiniti 50G Images permanently stored and available for review in PACS Ultrasound evaluation prior to injection reveals subacromial bursitis with intact rotator cuff  tendons. Verbal informed consent obtained.  Discussed risks and benefits of procedure. Warned about infection bleeding damage to structures skin hypopigmentation and fat atrophy among others. Patient expresses understanding and agreement Time-out conducted.   Noted no overlying erythema, induration, or other signs of local infection.   Skin prepped in a sterile fashion.   Local anesthesia: Topical Ethyl chloride.   With sterile technique and under real time ultrasound guidance:  40 mg of Kenalog and 2 mL of Marcaine injected into subacromial bursa. Fluid seen entering the bursa.   Completed without difficulty   Pain immediately resolved suggesting accurate placement of the medication.   Advised to call if fevers/chills, erythema, induration, drainage, or persistent bleeding.   Images permanently stored and available for review in the ultrasound unit.  Impression: Technically successful ultrasound guided injection.  EXAM: RIGHT SHOULDER - 2+ VIEW  COMPARISON:  Report dated 06/09/2018.  FINDINGS: Cystic changes in the humeral head. Normal appearing glenohumeral joint. No spurs seen.  IMPRESSION: No significant abnormality.   Electronically Signed   By: Claudie Revering M.D.   On: 06/03/2020 17:35   I, Lynne Leader, personally (independently) visualized and performed the interpretation of the images attached in this note.     Assessment and Plan: 70 y.o. female with right shoulder pain occurring after lifting a heavy object.  Pain thought to be due to subacromial bursitis.  Plan for home exercise program, subacromial injection.  Patient will notify me if not improving in a few weeks and if not better will proceed with  formal physical therapy.  Recheck back in about 6 weeks.  Recheck sooner if needed.   PDMP not reviewed this encounter. Orders Placed This Encounter  Procedures  . [place] LIMITED JOINT SPACE STRUCTURES UP RIGHT(NO LINKED CHARGES)    Standing Status:   Future     Number of Occurrences:   1    Standing Expiration Date:   07/19/2021    Order Specific Question:   Reason for Exam (SYMPTOM  OR DIAGNOSIS REQUIRED)    Answer:   Right upper arm pain    Order Specific Question:   Preferred imaging location?    Answer:   [place]   No orders of the defined types were placed in this encounter.    Discussed warning signs or symptoms. Please see discharge instructions. Patient expresses understanding.   The above documentation has been reviewed and is accurate and complete Lynne Leader, M.D.

## 2021-01-16 ENCOUNTER — Ambulatory Visit: Payer: Self-pay

## 2021-01-16 ENCOUNTER — Other Ambulatory Visit: Payer: Self-pay

## 2021-01-16 ENCOUNTER — Ambulatory Visit: Payer: Medicare PPO | Admitting: Family Medicine

## 2021-01-16 VITALS — BP 128/84 | HR 55 | Ht 66.0 in | Wt 189.2 lb

## 2021-01-16 DIAGNOSIS — M79621 Pain in right upper arm: Secondary | ICD-10-CM

## 2021-01-16 NOTE — Patient Instructions (Addendum)
Thank you for coming in today.  You received an injection today. Seek immediate medical attention if the joint becomes red, extremely painful, or is oozing fluid.  Please complete the exercises that the athletic trainer went over with you: View at [email or web address] using code: [email or web address]  Please schedule a follow up for 6 weeks.

## 2021-01-27 ENCOUNTER — Ambulatory Visit: Payer: Medicare PPO | Admitting: Internal Medicine

## 2021-01-27 ENCOUNTER — Encounter: Payer: Self-pay | Admitting: Internal Medicine

## 2021-01-27 ENCOUNTER — Other Ambulatory Visit: Payer: Self-pay

## 2021-01-27 VITALS — BP 122/90 | HR 54 | Temp 98.1°F | Resp 18 | Ht 66.0 in | Wt 182.2 lb

## 2021-01-27 DIAGNOSIS — R5383 Other fatigue: Secondary | ICD-10-CM | POA: Diagnosis not present

## 2021-01-27 DIAGNOSIS — R0602 Shortness of breath: Secondary | ICD-10-CM

## 2021-01-27 DIAGNOSIS — E782 Mixed hyperlipidemia: Secondary | ICD-10-CM

## 2021-01-27 DIAGNOSIS — Z951 Presence of aortocoronary bypass graft: Secondary | ICD-10-CM | POA: Diagnosis not present

## 2021-01-27 LAB — TROPONIN I (HIGH SENSITIVITY): High Sens Troponin I: 7 ng/L (ref 2–17)

## 2021-01-27 LAB — COMPREHENSIVE METABOLIC PANEL
ALT: 22 U/L (ref 0–35)
AST: 21 U/L (ref 0–37)
Albumin: 4.5 g/dL (ref 3.5–5.2)
Alkaline Phosphatase: 55 U/L (ref 39–117)
BUN: 18 mg/dL (ref 6–23)
CO2: 27 mEq/L (ref 19–32)
Calcium: 9.8 mg/dL (ref 8.4–10.5)
Chloride: 102 mEq/L (ref 96–112)
Creatinine, Ser: 1 mg/dL (ref 0.40–1.20)
GFR: 57.12 mL/min — ABNORMAL LOW (ref >60.00)
Glucose, Bld: 84 mg/dL (ref 70–99)
Potassium: 3.6 mEq/L (ref 3.5–5.1)
Sodium: 137 mEq/L (ref 135–145)
Total Bilirubin: 0.5 mg/dL (ref 0.2–1.2)
Total Protein: 7.7 g/dL (ref 6.0–8.3)

## 2021-01-27 LAB — CBC
HCT: 41.2 % (ref 36.0–46.0)
Hemoglobin: 13.9 g/dL (ref 12.0–15.0)
MCHC: 33.8 g/dL (ref 30.0–36.0)
MCV: 88.6 fl (ref 78.0–100.0)
Platelets: 299 10*3/uL (ref 150.0–400.0)
RBC: 4.65 Mil/uL (ref 3.87–5.11)
RDW: 13.9 % (ref 11.5–15.5)
WBC: 5.9 10*3/uL (ref 4.0–10.5)

## 2021-01-27 LAB — VITAMIN D 25 HYDROXY (VIT D DEFICIENCY, FRACTURES): VITD: 55.34 ng/mL (ref 30.00–100.00)

## 2021-01-27 LAB — HEMOGLOBIN A1C: Hgb A1c MFr Bld: 5.9 % (ref 4.6–6.5)

## 2021-01-27 LAB — LIPID PANEL
Cholesterol: 230 mg/dL — ABNORMAL HIGH (ref 0–200)
HDL: 46.4 mg/dL (ref >39.00)
LDL Cholesterol: 165 mg/dL — ABNORMAL HIGH (ref 0–99)
NonHDL: 183.48
Total CHOL/HDL Ratio: 5
Triglycerides: 93 mg/dL (ref 0.0–149.0)
VLDL: 18.6 mg/dL (ref 0.0–40.0)

## 2021-01-27 LAB — TSH: TSH: 2.31 u[IU]/mL (ref 0.35–4.50)

## 2021-01-27 LAB — BRAIN NATRIURETIC PEPTIDE: Pro B Natriuretic peptide (BNP): 25 pg/mL (ref 0.0–100.0)

## 2021-01-27 LAB — FERRITIN: Ferritin: 191.4 ng/mL (ref 10.0–291.0)

## 2021-01-27 LAB — VITAMIN B12: Vitamin B-12: 228 pg/mL (ref 211–911)

## 2021-01-27 NOTE — Assessment & Plan Note (Signed)
Concern for cardiac equivalent given history of recent CABG 2020. Checking troponin, BNP, CBC, CMP.

## 2021-01-27 NOTE — Progress Notes (Signed)
Subjective:   Patient ID: Jessica [name], female    DOB: 15-Jul-1951, 70 y.o.   MRN: [id number]  HPI The patient is a 70 YO female coming in for chest pain and SOB on exertion. Started in the last week or so. She has not called her cardiologist. In addition she would like to switch to [facility] office as it is much closer to her home. Her husband sees Dr. Rockey Situ there and she would like to as well. She denies that this feels similar to before she had her CABG. She is getting SOB with climbing stairs. Sometimes randomly she will get very tired. Denies SOB with lying flat. Has not had to prop up to sleep. Denies fevers or chills. Denies cough. She did have CABG 2020. Denies being out of medication recently. Denies change in diet or activity. Feels very exhausted overall. Sleeping fine. Overall not improving and may be worsening. The chest pain feels like muscle pulling an is worse with leaning forward. No chest pain at present. Hx breast cancer around 2000 no new mass or change. No new leg swelling. No recent weight change. She is also getting some random episodes of nausea usually if she has not eaten in while. These pass on their own. Denies vomiting. Denies abdominal pain. Denies burning sensation or GERD sensation.  Review of Systems  Constitutional: Negative.   HENT: Negative.   Eyes: Negative.   Respiratory: Positive for shortness of breath. Negative for cough and chest tightness.   Cardiovascular: Positive for chest pain. Negative for palpitations and leg swelling.  Gastrointestinal: Negative for abdominal distention, abdominal pain, constipation, diarrhea, nausea and vomiting.  Musculoskeletal: Negative.   Skin: Negative.   Neurological: Negative.   Psychiatric/Behavioral: Negative.     Objective:  Physical Exam Constitutional:      Appearance: She is well-developed.  HENT:     Head: Normocephalic and atraumatic.  Cardiovascular:     Rate and Rhythm: Normal rate and regular  rhythm.  Pulmonary:     Effort: Pulmonary effort is normal. No respiratory distress.     Breath sounds: Normal breath sounds. No wheezing or rales.     Comments: Midline chest tenderness to palpation Chest:     Chest wall: Tenderness present.  Abdominal:     General: Bowel sounds are normal. There is no distension.     Palpations: Abdomen is soft.     Tenderness: There is no abdominal tenderness. There is no rebound.  Musculoskeletal:     Cervical back: Normal range of motion.  Skin:    General: Skin is warm and dry.  Neurological:     Mental Status: She is alert and oriented to person, place, and time.     Coordination: Coordination normal.     Vitals:   01/27/21 0801  BP: 122/90  Pulse: (!) 54  Resp: 18  Temp: 98.1 F (36.7 C)  TempSrc: Oral  SpO2: 96%  Weight: 182 lb 3.2 oz (82.6 kg)  Height: 5\' 6"  (1.676 m)   EKG: Rate 52, axis normal, interval normal, prior 1st degree AV block resolved, stable st changes, prior anteroseptal infarct, no significant change from prior 2021 and prior 2022  This visit occurred during the SARS-CoV-2 public health emergency.  Safety protocols were in place, including screening questions prior to the visit, additional usage of staff PPE, and extensive cleaning of exam room while observing appropriate contact time as indicated for disinfecting solutions.   Assessment & Plan:  Visit time 20 minutes  in face to face communication with patient and coordination of care, additional 15 minutes spent in record review, coordination or care, ordering tests, communicating/referring to other healthcare professionals, documenting in medical records all on the same day of the visit for total time 35 minutes spent on the visit.

## 2021-01-27 NOTE — Assessment & Plan Note (Signed)
Checking lipid panel on praluent. Cannot tolerate statins.

## 2021-01-27 NOTE — Assessment & Plan Note (Signed)
May be cardiac related but checking thyroid, vitamin levels, CMP, CBC to rule out other metabolic causes.

## 2021-01-27 NOTE — Patient Instructions (Signed)
We will get you in with Jessica [name] in [place]. We have done the EKG done which is stable from before.   We are checking the labs and depending on the results may need to recheck an ultrasound (also called an echo) of the heart.   Try switching to zyrtec from claritin to see if this helps the eyes.

## 2021-01-27 NOTE — Assessment & Plan Note (Signed)
Wants to switch cardiologist so referral done to preferred cardiologist and location.

## 2021-02-22 NOTE — Addendum Note (Signed)
Addended by: Thomes Cake on: 02/22/2021 01:37 PM   Modules accepted: Orders

## 2021-02-27 ENCOUNTER — Ambulatory Visit: Payer: Medicare PPO | Admitting: Family Medicine

## 2021-02-28 ENCOUNTER — Ambulatory Visit: Payer: Medicare PPO | Admitting: Cardiovascular Disease

## 2021-02-28 ENCOUNTER — Other Ambulatory Visit: Payer: Self-pay

## 2021-02-28 ENCOUNTER — Encounter: Payer: Self-pay | Admitting: Cardiovascular Disease

## 2021-02-28 VITALS — BP 122/86 | HR 59 | Ht 66.0 in | Wt 181.2 lb

## 2021-02-28 DIAGNOSIS — I25118 Atherosclerotic heart disease of native coronary artery with other forms of angina pectoris: Secondary | ICD-10-CM

## 2021-02-28 DIAGNOSIS — Z789 Other specified health status: Secondary | ICD-10-CM

## 2021-02-28 DIAGNOSIS — Z951 Presence of aortocoronary bypass graft: Secondary | ICD-10-CM

## 2021-02-28 DIAGNOSIS — E782 Mixed hyperlipidemia: Secondary | ICD-10-CM

## 2021-02-28 MED ORDER — EZETIMIBE 10 MG PO TABS: 10.0000 mg | ORAL_TABLET | Freq: Every day | ORAL | 3 refills | Status: DC

## 2021-02-28 NOTE — Progress Notes (Signed)
Cardiology Office Note  Date:  02/28/2021   ID:  Jessica [name], Jessica [name] 03-14-1951, MRN [id number]  PCP:  Hoyt Koch, MD   Chief Complaint  Patient presents with  . Establish Care    Former Dr. Gwenlyn Found patient; the patient lives in [place] and would like to have a physician closer to home. Patient c/o shortness of breath, cramping in legs at night, weakness in legs when climbing stairs and occasional chest pressure at rest. Medications reviewed by the patient verbally.     HPI:  Jessica [name] is a 70 year old woman with past medical history of Severe three-vessel CAD , CABG x5 by Dr. Cyndia Bent 01/05/2019  LIMA to the LAD, vein to diagonal branch, obtuse marginal branches 1 and 2 sequentially and to the RCA.   Hyperlipidemia Hypertension Statin intolerance Breast cancer Referred by Pricilla Holm for symptoms of shortness of breath and chest pain  Reports shortness of breath with climbing stairs, Increased fatigue Shortness of breath when lying flat  Seen in the emergency room March 2022 for right upper extremity pain Treated with steroids  Echocardiogram March 2021 Normal LV function Moderate elevated right heart pressures  EKG personally reviewed by myself on todays visit Shows normal sinus rhythm rate 59 bpm no significant ST-T wave changes  Lab work reviewed A1c 5.9  Lab Results  Component Value Date   CHOL 230 (H) 01/27/2021   HDL 46.40 01/27/2021   LDLCALC 165 (H) 01/27/2021   TRIG 93.0 01/27/2021    Prior cardiac history  [facility] on 12/[facility]/2018 with unstable angina and underwent cardiac catheterization by Dr. Irish Lack the following day revealing three-vessel disease with preserved LV function.    01/05/2019 she underwent CABG x5 by Dr. Cyndia Bent the LIMA to her LAD, vein to diagonal branch, RCA and obtuse marginal branches 1 and 2 sequentially.   stopped her Repatha because of intolerance.  previously tried praluent aching   intolerant to statin drugs as well. Crestor, lipitor, and others  PMH:   has a past medical history of Allergy, Breast cancer ([place]) (2000), Cancer ([place]), Colon polyp, Coronary artery disease, GERD (gastroesophageal reflux disease), History of blood transfusion, Hyperlipidemia, Migraine, and Personal history of radiation therapy (2000).  PSH:    Past Surgical History:  Procedure Laterality Date  . ABDOMINAL HYSTERECTOMY    . BREAST LUMPECTOMY Right 2000  . BREAST SURGERY Right    calcifaction removed  . BUNIONECTOMY Bilateral   . COLONOSCOPY W/ POLYPECTOMY    . CORONARY ARTERY BYPASS GRAFT N/A 01/05/2019   Procedure: CORONARY ARTERY BYPASS GRAFTING (CABG), using right leg saphenous endoscopic and open vein harvest, exploration left leg;  Surgeon: Gaye Pollack, MD;  Location: [facility] OR;  Service: Open Heart Surgery;  Laterality: N/A;  . EYE SURGERY Bilateral    cataract  . hemorrhoid    . LEFT HEART CATH AND CORONARY ANGIOGRAPHY N/A 01/01/2019   Procedure: LEFT HEART CATH AND CORONARY ANGIOGRAPHY;  Surgeon: Jettie Booze, MD;  Location: [facility];  Service: Cardiovascular;  Laterality: N/A;  . STERNAL WIRES REMOVAL N/A 05/11/2019   Procedure: STERNAL WIRES REMOVAL;  Surgeon: Gaye Pollack, MD;  Location: [facility];  Service: Thoracic;  Laterality: N/A;  . TEE WITHOUT CARDIOVERSION N/A 01/05/2019   Procedure: TRANSESOPHAGEAL ECHOCARDIOGRAM (TEE);  Surgeon: Gaye Pollack, MD;  Location: [facility];  Service: Open Heart Surgery;  Laterality: N/A;    Current Outpatient Medications  Medication Sig Dispense Refill  . Alirocumab (PRALUENT) 75  MG/ML SOAJ Inject 75 mg into the skin every 14 (fourteen) days. 1 mL 0  . Ascorbic Acid (VITAMIN C) 1000 MG tablet Take 2,000 mg by mouth daily.    [name] aspirin EC 81 MG tablet Take 1 tablet (81 mg total) by mouth daily. Swallow whole. 90 tablet 3  . Cholecalciferol (VITAMIN D-3) 125 MCG (5000 UT) TABS Take 5,000 Units by mouth daily.    [name] ezetimibe  (ZETIA) 10 MG tablet Take 1 tablet (10 mg total) by mouth daily. 90 tablet 3  . loratadine (CLARITIN) 10 MG tablet Take 10 mg by mouth every evening.     . metoprolol tartrate (LOPRESSOR) 25 MG tablet Take 0.5 tablets (12.5 mg total) by mouth daily. 30 tablet 11  . Multiple Vitamin (MULTIVITAMIN WITH MINERALS) TABS tablet Take 1 tablet by mouth daily.     No current facility-administered medications for this visit.     Allergies:   Codeine, Repatha [evolocumab], Sulfa antibiotics, and Sulfacetamide   Social History:  The patient  reports that she has never smoked. She has never used smokeless tobacco. She reports that she does not drink alcohol and does not use drugs.   Family History:   family history includes Asthma in her paternal aunt; Diabetes in her paternal aunt; Heart attack (age of onset: 66) in her father; Heart disease in her brother; Kidney disease in her paternal uncle; Stroke in her paternal uncle.    Review of Systems: Review of Systems  Constitutional: Negative.   HENT: Negative.   Respiratory: Negative.   Cardiovascular: Negative.   Gastrointestinal: Negative.   Musculoskeletal: Negative.   Neurological: Negative.   Psychiatric/Behavioral: Negative.   All other systems reviewed and are negative.   PHYSICAL EXAM: VS:  BP 122/86 (BP Location: Right Arm, Patient Position: Sitting, Cuff Size: Normal)   Pulse (!) 59   Ht 5\' 6"  (1.676 m)   Wt 181 lb 4 oz (82.2 kg)   SpO2 99%   BMI 29.25 kg/m  , BMI Body mass index is 29.25 kg/m. GEN: Well nourished, well developed, in no acute distress HEENT: normal Neck: no JVD, carotid bruits, or masses Cardiac: RRR; no murmurs, rubs, or gallops,no edema  Respiratory:  clear to auscultation bilaterally, normal work of breathing GI: soft, nontender, nondistended, + BS MS: no deformity or atrophy Skin: warm and dry, no rash Neuro:  Strength and sensation are intact Psych: euthymic mood, full affect   Recent  Labs: 04/22/2020: Magnesium 1.8 01/27/2021: ALT 22; BUN 18; Creatinine, Ser 1.00; Hemoglobin 13.9; Platelets 299.0; Potassium 3.6; Pro B Natriuretic peptide (BNP) 25.0; Sodium 137; TSH 2.31    Lipid Panel Lab Results  Component Value Date   CHOL 230 (H) 01/27/2021   HDL 46.40 01/27/2021   LDLCALC 165 (H) 01/27/2021   TRIG 93.0 01/27/2021      Wt Readings from Last 3 Encounters:  02/28/21 181 lb 4 oz (82.2 kg)  01/27/21 182 lb 3.2 oz (82.6 kg)  01/16/21 189 lb 3.2 oz (85.8 kg)      ASSESSMENT AND PLAN:  Problem List Items Addressed This Visit      Cardiology Problems   Hyperlipidemia   Relevant Medications   ezetimibe (ZETIA) 10 MG tablet    Other Visit Diagnoses    Coronary artery disease of native artery of native heart with stable angina pectoris ([place])    -  Primary   Relevant Medications   ezetimibe (ZETIA) 10 MG tablet   Other Relevant Orders  EKG 12-Lead   Hx of CABG       Relevant Orders   EKG 12-Lead   Statin intolerance         CAD with stable angina History of bypass, 2020 Unable to tolerate statins has tried several, reports she has tried Repatha, Praluent had side effects Will recommend we look into Leqvio given her intolerances, she is very interested Denies anginal symptoms, no further ischemic work-up at this time Recommend lifestyle modification, walking program  Hyperlipidemia Management as above     Total encounter time more than 35 minutes  Greater than 50% was spent in counseling and coordination of care with the patient    Signed, Esmond Plants, M.D., Ph.D. [place], [place]

## 2021-02-28 NOTE — Patient Instructions (Addendum)
We will contact our pharmacy team in [place] regarding alteratives such as (Leqvio/inclisirin) for cholesterol medication   Medication Instructions:  Start Zetia 10 mg daily (cholesterol)   If you need a refill on your cardiac medications before your next appointment, please call your pharmacy.    Lab work: No new labs needed  Testing/Procedures: No new testing needed   Follow-Up:  . You will need a follow up appointment in 6 months  . Providers on your designated Care Team:   . Murray Hodgkins, NP . Christell Faith, PA-C . Marrianne Mood, PA-C  COVID-19 Vaccine Information can be found at: ShippingScam.co.uk For questions related to vaccine distribution or appointments, please email [email or web address] or call [phone].

## 2021-03-01 ENCOUNTER — Telehealth: Payer: Self-pay

## 2021-03-01 NOTE — Telephone Encounter (Signed)
Attempted to reach out to Mrs. Gazda, unable to reach pt, LDM on VM (DPR approved).   Dr. Rockey Situ seen pt yesterday in clinic, suggested reaching out to PharmD about Leqvio for pt since other statin drugs and cholesterol medications are not suitable for pt as she has tried in the past. Jinny Blossom with PharmD reach back out and suggested  she would be a good candidate. PA form already started, pt will need to come in and sign for PA. The company will complete the benefit investigation to confirm her copay and any prior authorization needs. Pt could have Leqvio copay around $40 per injection if PA is approved.   Advised pt to call the office for further details or she may walk into clinic for further info and sign the PA forms then will fax to [facility].

## 2021-03-03 ENCOUNTER — Telehealth: Payer: Self-pay | Admitting: *Deleted

## 2021-03-03 NOTE — Telephone Encounter (Signed)
Attempted to call the patient at her home & cell #'s. No answer at either #- I left a message to call back on both #'s.

## 2021-03-03 NOTE — Telephone Encounter (Signed)
----- Message from Wynema Birch, RN sent at 03/02/2021  5:09 PM EDT ----- Regarding: FW: Leqvio/inclisiran Can someone try and reach out to this lady again regarding coming in to sign form for PA on Leqvio. Paperwork is completed, just need her signature. I have left VM. [employer] [place] has worked to get the PA started.  If she comes in, the paperwork is on my cart. The fax sheet is already filled out. Thanks  Mandie  ----- Message ----- From: Leeroy Bock, RPH-CPP Sent: 03/01/2021  10:23 AM EDT To: Wynema Birch, RN Subject: RE: Leqvio/inclisiran                          Nope, I'll take care of it. The patients actually have to go to [facility] to get their injection there and the ordering process has been pretty involved, you won't need to worry about it ----- Message ----- From: Wynema Birch, RN Sent: 02/28/2021   5:06 PM EDT To: Leeroy Bock, RPH-CPP Subject: RE: Leqvio/inclisiran                          Perfect...one more question. I assume I need to place the order for the medication, do I need to send it in to any pharmacy to have filled or will they send it to her? ----- Message ----- From: Leeroy Bock, RPH-CPP Sent: 02/28/2021   4:41 PM EDT To: Wynema Birch, RN Subject: RE: Leqvio/inclisiran                          Nope he doesn't, just the pt! ----- Message ----- From: Wynema Birch, RN Sent: 02/28/2021   4:17 PM EDT To: Leeroy Bock, RPH-CPP Subject: RE: Leqvio/inclisiran                          Megan,  Thanks so much!!!, Received your email, I have printed the form and will have pt come in to sign. Does Dr. Rockey Situ need to sign anything before I fax to the [facility] portal?  ----- Message ----- From: Leeroy Bock, RPH-CPP Sent: 02/28/2021   3:52 PM EDT To: Wynema Birch, RN Subject: RE: Leqvio/inclisiran                          Princella Pellegrini,  I think she would be a good candidate. I just emailed you a form she'll need to sign and date. It will need to be  faxed along with a copy of her [facility] card (already scanned in Media tab in [place]) to the [facility] portal [phone]. The company will complete the benefit investigation to confirm her copay and any prior authorization needs. I did start a pt with the same insurance on Leqvio and her copay was only $40 per injection so I'm hoping it would be the same for this pt.  Thanks, Jinny Blossom ----- Message ----- From: Wynema Birch, RN Sent: 02/28/2021   2:48 PM EDT To: Leeroy Bock, RPH-CPP Subject: Leqvio/inclisiran                              [place],  Dr. Rockey Situ asked me to reach out to you regarding Mrs. Manrique ability to get on Leqvio/inclisiran.  Mrs. Bechtold has not tolerated statins drugs in the past and was also intolerance to PCSK9 inhibitors. We have currently started her on Zetia 10 mg daily as of today to hold her over until we can find a medication that is suitable to her.   Would Leqvio/inclisiran be a good alternative to try? Can it be cost effective for Mrs. Volcy, if not is there PA that can assist with trying this medication?  Thanks [employer], RN

## 2021-03-03 NOTE — Telephone Encounter (Signed)
Spoke with patient and reviewed that we have some paperwork for her to sign for patient assistance. She is not able to come in today but will be by on Monday to sign forms. Advised that would be fine and I will update Dr. Donivan Scull nurse that she will be in on Monday. She was appreciative for call with no further questions at this time.

## 2021-03-06 NOTE — Telephone Encounter (Signed)
Patient came in this morning and signed form.

## 2021-03-06 NOTE — Telephone Encounter (Signed)
Mrs. Suire was able to come by the office this am any signed the paperwork for PA for [facility]  PA application faxed as well. Copy sent to PharmD, Jinny Blossom

## 2021-03-07 NOTE — Telephone Encounter (Signed)
Received benefit investigation form back for Leqvio which will be covered at a $40 copay for each injection for pt once prior authorization is approved. PA will need to be called into [phone].

## 2021-03-07 NOTE — Addendum Note (Signed)
Addended by: Mariana Goytia E on: 03/07/2021 12:07 PM   Modules accepted: Orders

## 2021-03-07 NOTE — Telephone Encounter (Signed)
Pa submitted for leqvio to [employer] and clinical information faxed over

## 2021-03-08 NOTE — Telephone Encounter (Signed)
Called pt to advise her that Mcarthur Rossetti has approved Leqvio and will cover Leqvio for a $40 copay per injection. She is aware her injections will be given at [facility] and that we are currently on hold with scheduling appts while [facility] sorts out a few billing issues. Will call pt as soon as we are able to schedule her first Leqvio injection. She was appreciative for the call.

## 2021-03-15 ENCOUNTER — Other Ambulatory Visit: Payer: Self-pay

## 2021-03-15 ENCOUNTER — Ambulatory Visit: Payer: Medicare PPO | Admitting: Internal Medicine

## 2021-03-15 ENCOUNTER — Encounter: Payer: Self-pay | Admitting: Internal Medicine

## 2021-03-15 DIAGNOSIS — H5789 Other specified disorders of eye and adnexa: Secondary | ICD-10-CM

## 2021-03-15 DIAGNOSIS — M79605 Pain in left leg: Secondary | ICD-10-CM

## 2021-03-15 MED ORDER — OLOPATADINE HCL 0.1 % OP SOLN: 1.0000 [drp] | Freq: Two times a day (BID) | OPHTHALMIC | 12 refills | Status: DC

## 2021-03-15 NOTE — Patient Instructions (Signed)
We have sent in eye drops to use 1 drop in each eye twice a day.

## 2021-03-15 NOTE — Progress Notes (Signed)
   Subjective:   Patient ID: Jessica [name], female    DOB: [birth year], 70 y.o.   MRN: [id number]  HPI The patient is a 70 YO female coming in for concerns about watery eyes. Seen several eye specialists and tried several eye drops without relief. She is taking otc allergy medication without relief. This is a bother and getting in the way of her vision.   Review of Systems  Constitutional: Negative.   HENT: Negative.   Eyes: Positive for discharge.  Respiratory: Negative for cough, chest tightness and shortness of breath.   Cardiovascular: Negative for chest pain, palpitations and leg swelling.  Gastrointestinal: Negative for abdominal distention, abdominal pain, constipation, diarrhea, nausea and vomiting.  Musculoskeletal: Negative.   Skin: Negative.   Neurological: Negative.   Psychiatric/Behavioral: Negative.     Objective:  Physical Exam Constitutional:      Appearance: She is well-developed.  HENT:     Head: Normocephalic and atraumatic.  Eyes:     Comments: Clear watery drainage from both eyes, no signs of conjunctivitis  Cardiovascular:     Rate and Rhythm: Normal rate and regular rhythm.  Pulmonary:     Effort: Pulmonary effort is normal. No respiratory distress.     Breath sounds: Normal breath sounds. No wheezing or rales.  Abdominal:     General: Bowel sounds are normal. There is no distension.     Palpations: Abdomen is soft.     Tenderness: There is no abdominal tenderness. There is no rebound.  Musculoskeletal:     Cervical back: Normal range of motion.  Skin:    General: Skin is warm and dry.  Neurological:     Mental Status: She is alert and oriented to person, place, and time.     Coordination: Coordination normal.     Vitals:   03/15/21 0928  BP: 122/90  Pulse: (!) 56  Resp: 18  Temp: 98 F (36.7 C)  TempSrc: Oral  SpO2: 99%  Weight: 186 lb 9.6 oz (84.6 kg)  Height: 5\' 6"  (1.676 m)    This visit occurred during the SARS-CoV-2 public  health emergency.  Safety protocols were in place, including screening questions prior to the visit, additional usage of staff PPE, and extensive cleaning of exam room while observing appropriate contact time as indicated for disinfecting solutions.   Assessment & Plan:

## 2021-03-17 ENCOUNTER — Encounter: Payer: Self-pay | Admitting: Internal Medicine

## 2021-03-17 DIAGNOSIS — H5789 Other specified disorders of eye and adnexa: Secondary | ICD-10-CM | POA: Insufficient documentation

## 2021-03-17 NOTE — Assessment & Plan Note (Signed)
She has seen 2-3 eye doctors about this and tried multiple eye drops to help which have not been effective. This is vision threatening as it causes her vision to be blurred and significantly interferes with her life. Rx patanol eye drops which she states she does not recall trying.

## 2021-03-17 NOTE — Assessment & Plan Note (Signed)
Injury 3 weeks ago and still hurting. Suspect she bruised the bone as the injury is directly over. No fracture detected on exam. Will continue to heal use otc and heat for pain.

## 2021-03-29 ENCOUNTER — Encounter: Payer: Self-pay | Admitting: Internal Medicine

## 2021-03-29 DIAGNOSIS — H5789 Other specified disorders of eye and adnexa: Secondary | ICD-10-CM

## 2021-04-03 DIAGNOSIS — L218 Other seborrheic dermatitis: Secondary | ICD-10-CM | POA: Diagnosis not present

## 2021-04-12 ENCOUNTER — Ambulatory Visit: Payer: Medicare Other | Admitting: Allergy and Immunology

## 2021-04-20 DIAGNOSIS — M13841 Other specified arthritis, right hand: Secondary | ICD-10-CM | POA: Diagnosis not present

## 2021-05-01 DIAGNOSIS — M65842 Other synovitis and tenosynovitis, left hand: Secondary | ICD-10-CM | POA: Diagnosis not present

## 2021-05-01 DIAGNOSIS — M189 Osteoarthritis of first carpometacarpal joint, unspecified: Secondary | ICD-10-CM | POA: Diagnosis not present

## 2021-05-01 DIAGNOSIS — M79645 Pain in left finger(s): Secondary | ICD-10-CM | POA: Diagnosis not present

## 2021-05-01 DIAGNOSIS — M659 Synovitis and tenosynovitis, unspecified: Secondary | ICD-10-CM | POA: Insufficient documentation

## 2021-05-01 DIAGNOSIS — M1812 Unilateral primary osteoarthritis of first carpometacarpal joint, left hand: Secondary | ICD-10-CM | POA: Diagnosis not present

## 2021-05-11 DIAGNOSIS — M79641 Pain in right hand: Secondary | ICD-10-CM | POA: Diagnosis not present

## 2021-05-11 DIAGNOSIS — M79642 Pain in left hand: Secondary | ICD-10-CM | POA: Diagnosis not present

## 2021-05-25 IMAGING — CR CHEST - 2 VIEW
2 series · 2 of 2 positions shown · non-contrast
Comparison: Multiple previous chest x-rays. The most recent is
02/20/2019

CLINICAL DATA: History of bypass surgery December 2018.

EXAM:
CHEST - 2 VIEW

[w chest pa]
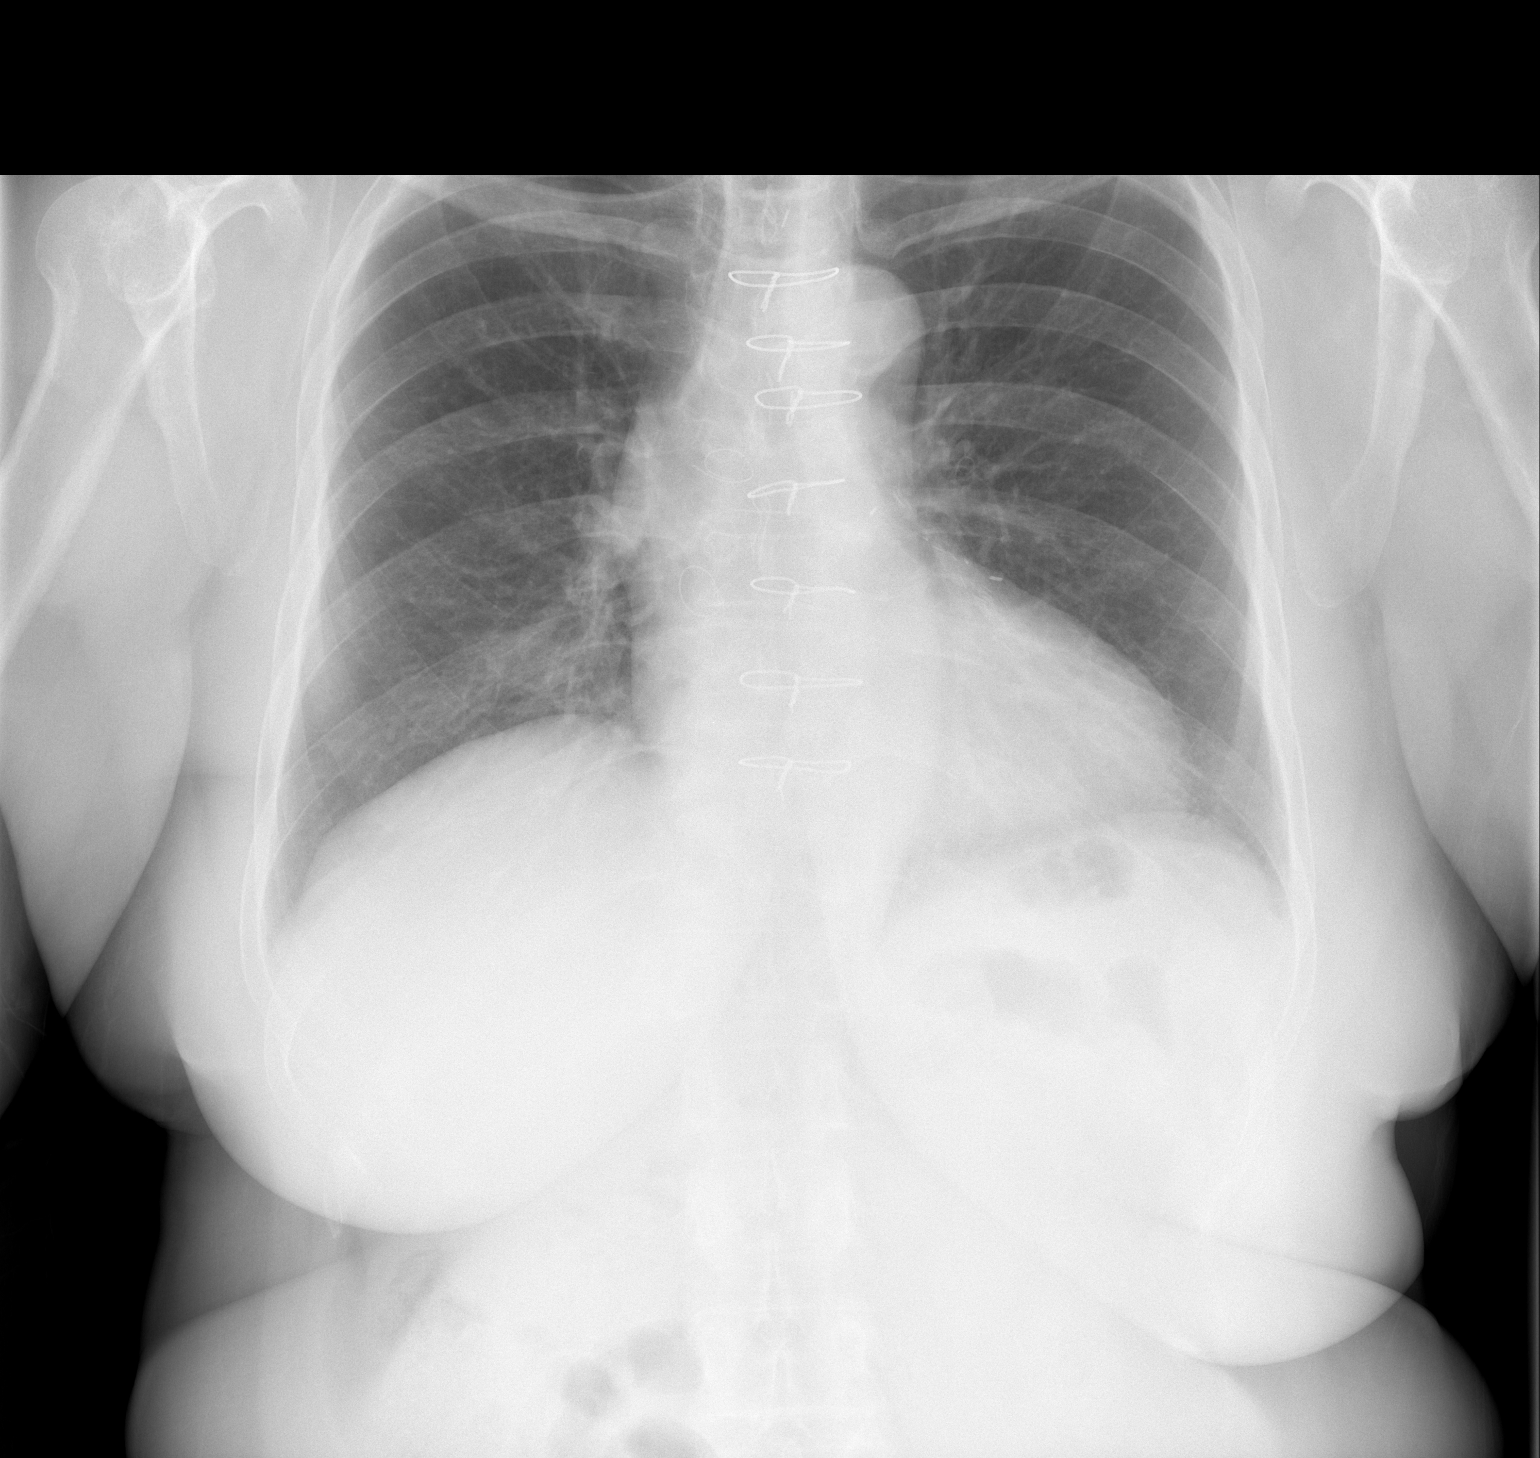

[w chest lat]
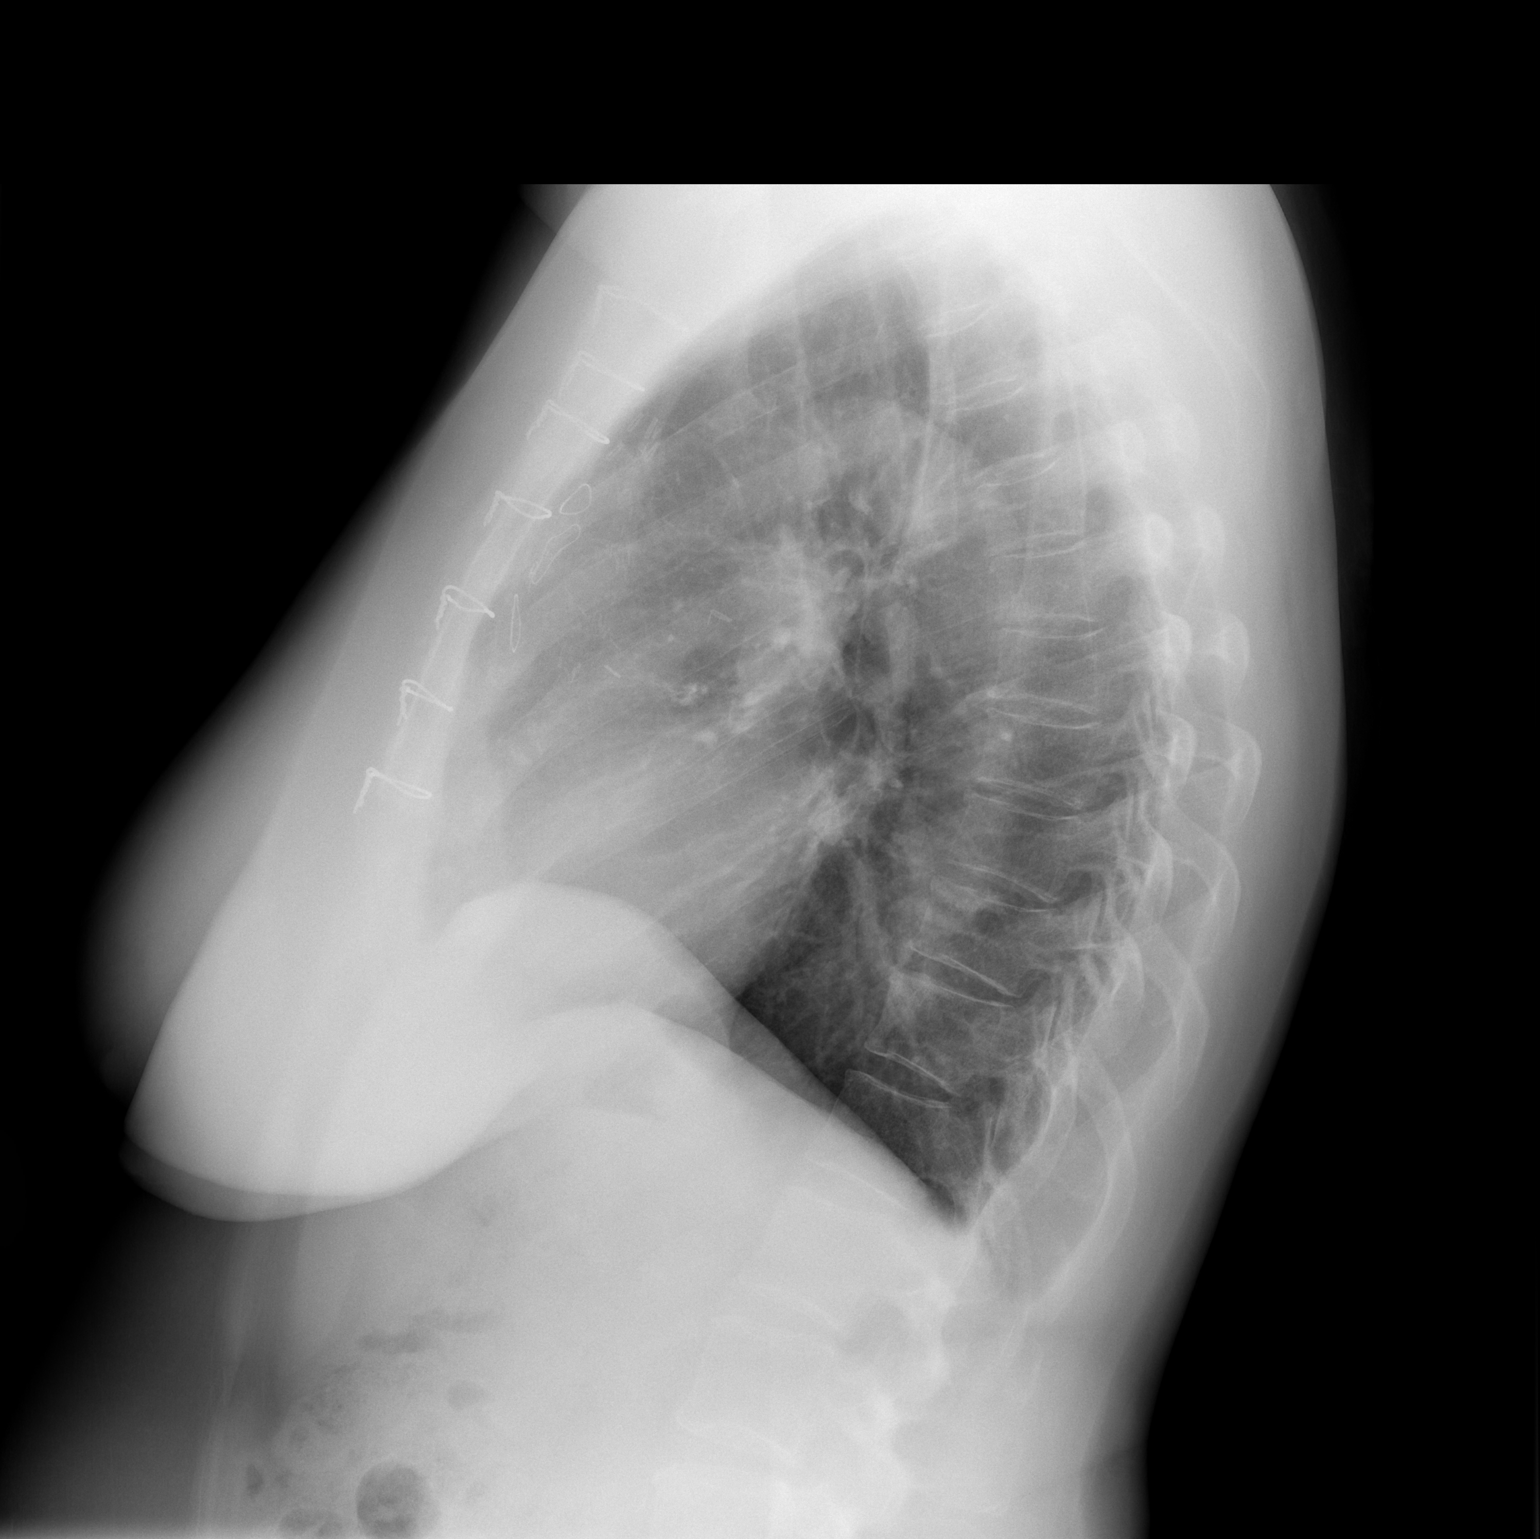

[2 of 2 positions shown; findings below may reference images not displayed]

FINDINGS: Stable surgical changes from bypass surgery. The heart is normal in
size. Mild tortuosity of the thoracic aorta. The lungs are clear. No
pleural effusions or pulmonary lesions. The bony thorax is intact.
IMPRESSION: No acute cardiopulmonary findings.

## 2021-05-30 ENCOUNTER — Telehealth: Payer: Self-pay | Admitting: Cardiovascular Disease

## 2021-05-30 NOTE — Telephone Encounter (Signed)
Prior authorization was already completed and approved 3 months ago through 10/21/21. Cannot schedule injection until [facility] gives the go ahead - still waiting on Medicare reimbursement from injections given 5+ months ago on other patients. I responded to Courtney's message in [place] portal, no further action needed at this time.

## 2021-05-30 NOTE — Telephone Encounter (Signed)
Jessica [name] was calling from the [facility] service center. She is trying to get a prior authorization on this medication. She states she has attempted to reach out to [facility] or Jinny Blossom but has not gotten a response through the messaging portal

## 2021-05-30 NOTE — Telephone Encounter (Signed)
Unsure if I am to do a pa for this or not per Mr. Redge Gainer previous verbal instruction I was instructed to hold off on pas for this drug until we can start prescribing it. I will route to Mr. Evelene Croon to make the call on what he wants fto do with this one.

## 2021-05-31 DIAGNOSIS — R768 Other specified abnormal immunological findings in serum: Secondary | ICD-10-CM | POA: Diagnosis not present

## 2021-05-31 DIAGNOSIS — Z79899 Other long term (current) drug therapy: Secondary | ICD-10-CM | POA: Diagnosis not present

## 2021-05-31 DIAGNOSIS — M064 Inflammatory polyarthropathy: Secondary | ICD-10-CM | POA: Diagnosis not present

## 2021-05-31 DIAGNOSIS — M19041 Primary osteoarthritis, right hand: Secondary | ICD-10-CM | POA: Diagnosis not present

## 2021-05-31 DIAGNOSIS — Z111 Encounter for screening for respiratory tuberculosis: Secondary | ICD-10-CM | POA: Diagnosis not present

## 2021-05-31 DIAGNOSIS — M19042 Primary osteoarthritis, left hand: Secondary | ICD-10-CM | POA: Diagnosis not present

## 2021-05-31 DIAGNOSIS — Z7960 Long term (current) use of unspecified immunomodulators and immunosuppressants: Secondary | ICD-10-CM | POA: Insufficient documentation

## 2021-06-14 ENCOUNTER — Ambulatory Visit: Payer: Medicare PPO | Admitting: Internal Medicine

## 2021-06-14 ENCOUNTER — Other Ambulatory Visit: Payer: Self-pay

## 2021-06-14 ENCOUNTER — Encounter: Payer: Self-pay | Admitting: Internal Medicine

## 2021-06-14 VITALS — BP 128/90 | HR 68 | Temp 98.1°F | Resp 18 | Ht 66.0 in | Wt 174.6 lb

## 2021-06-14 DIAGNOSIS — R591 Generalized enlarged lymph nodes: Secondary | ICD-10-CM | POA: Diagnosis not present

## 2021-06-14 DIAGNOSIS — E782 Mixed hyperlipidemia: Secondary | ICD-10-CM

## 2021-06-14 LAB — LIPID PANEL
Cholesterol: 248 mg/dL — ABNORMAL HIGH (ref 0–200)
HDL: 32.3 mg/dL — ABNORMAL LOW (ref >39.00)
LDL Cholesterol: 187 mg/dL — ABNORMAL HIGH (ref 0–99)
NonHDL: 215.39
Total CHOL/HDL Ratio: 8
Triglycerides: 140 mg/dL (ref 0.0–149.0)
VLDL: 28 mg/dL (ref 0.0–40.0)

## 2021-06-14 NOTE — Progress Notes (Signed)
   Subjective:   Patient ID: Jessica [name], female    DOB: 04/16/1951, 70 y.o.   MRN: [id number]  HPI The patient is a 70 YO female coming in for concerns about collar bone. Got covid-19 vaccine within last month on that side. Now has lump not painful on that side. Started 2 days ago and has not changed since then.   Review of Systems  Constitutional: Negative.   HENT: Negative.    Eyes: Negative.   Respiratory:  Negative for cough, chest tightness and shortness of breath.   Cardiovascular:  Negative for chest pain, palpitations and leg swelling.  Gastrointestinal:  Negative for abdominal distention, abdominal pain, constipation, diarrhea, nausea and vomiting.  Musculoskeletal: Negative.   Skin: Negative.   Neurological: Negative.   Psychiatric/Behavioral: Negative.     Objective:  Physical Exam Constitutional:      Appearance: She is well-developed.  HENT:     Head: Normocephalic and atraumatic.  Neck:     Comments: Soft tissue fullness left cervical region Cardiovascular:     Rate and Rhythm: Normal rate and regular rhythm.  Pulmonary:     Effort: Pulmonary effort is normal. No respiratory distress.     Breath sounds: Normal breath sounds. No wheezing or rales.  Abdominal:     General: Bowel sounds are normal. There is no distension.     Palpations: Abdomen is soft.     Tenderness: There is no abdominal tenderness. There is no rebound.  Musculoskeletal:     Cervical back: Normal range of motion.  Skin:    General: Skin is warm and dry.  Neurological:     Mental Status: She is alert and oriented to person, place, and time.     Coordination: Coordination normal.    Vitals:   06/14/21 1020  BP: 128/90  Pulse: 68  Resp: 18  Temp: 98.1 F (36.7 C)  TempSrc: Oral  SpO2: 98%  Weight: 174 lb 9.6 oz (79.2 kg)  Height: '5\' 6"'$  (1.676 m)    This visit occurred during the SARS-CoV-2 public health emergency.  Safety protocols were in place, including screening  questions prior to the visit, additional usage of staff PPE, and extensive cleaning of exam room while observing appropriate contact time as indicated for disinfecting solutions.   Assessment & Plan:

## 2021-06-14 NOTE — Patient Instructions (Signed)
The lump is likely a small lymph node caused by the covid-19 vaccine.

## 2021-06-16 ENCOUNTER — Encounter: Payer: Self-pay | Admitting: Internal Medicine

## 2021-06-16 DIAGNOSIS — R591 Generalized enlarged lymph nodes: Secondary | ICD-10-CM | POA: Insufficient documentation

## 2021-06-16 NOTE — Assessment & Plan Note (Signed)
Suspect related to covid-19 vaccine and reassurance given that this should resolve in a few weeks. If growing or not improving let us know and we will get US neck (non-thyroid).

## 2021-06-19 ENCOUNTER — Telehealth: Payer: Self-pay

## 2021-06-19 NOTE — Telephone Encounter (Signed)
Patient called because she was not sure if skin testing is covered by her insurance. She plans to call back later on today to let us know if she is going to keep her appointment 06/20/21 with Dr.Gallagher.

## 2021-06-20 ENCOUNTER — Ambulatory Visit: Payer: Medicare PPO | Admitting: Allergy & Immunology

## 2021-06-20 ENCOUNTER — Encounter: Payer: Self-pay | Admitting: Allergy & Immunology

## 2021-06-20 ENCOUNTER — Other Ambulatory Visit: Payer: Self-pay

## 2021-06-20 VITALS — BP 130/72 | HR 62 | Temp 97.1°F | Resp 16 | Ht 66.0 in | Wt 183.4 lb

## 2021-06-20 DIAGNOSIS — H04203 Unspecified epiphora, bilateral lacrimal glands: Secondary | ICD-10-CM | POA: Diagnosis not present

## 2021-06-20 MED ORDER — CETIRIZINE HCL 10 MG PO TABS: 10.0000 mg | ORAL_TABLET | Freq: Every day | ORAL | 5 refills | Status: DC

## 2021-06-20 NOTE — Progress Notes (Signed)
NEW PATIENT  Date of Service/Encounter:  06/20/21  Consult requested by: Hoyt Koch, MD   Assessment:   Excessive tear production - with negative skin testing   Testing today is completely negative.  Because of the history, we did not do any intradermal testing.  I am open to trying that in the future, but with her lack of other atopic symptoms aside from the tearing eyes, I am not convinced that allergies are playing a role.  We are going to increase her anticholinergic coverage with Zyrtec to see if this provides more relief of her symptoms.  She is going to give this a try and give [place] a call in a couple weeks with a report.  Plan/Recommendations:   1. Excessive tear production of both lacrimal glands - Testing today showed: negative to the entire panel  - Copy of test results provided.  - Avoidance measures provided. - Stop taking: Claritin - Start taking: Zyrtec (cetirizine) '10mg'$  tablet once daily - You can use an extra dose of the antihistamine, if needed, for breakthrough symptoms.  - Let us know if this works better.  - Call us in 1-2 weeks with an update.  2. Return in about 3 months (around 09/20/2021).    This note in its entirety was forwarded to the Provider who requested this consultation.  Subjective:   Jessica [name] is a 70 y.o. female presenting today for evaluation of  Chief Complaint  Patient presents with   Establish Care    Patient in today for excessive eye watering. She has had this for about 3-4 years and seen different providers with no answers.      Jessica [name] has a history of the following: Patient Active Problem List   Diagnosis Date Noted   Lymphadenopathy of head and neck 06/16/2021   Eye drainage 03/17/2021   Fatigue 01/27/2021   Pain and swelling of right upper extremity 06/05/2020   Cervical disc disorder with radiculopathy of cervical region 05/24/2020   Post inflammatory hypopigmentation 12/30/2019    Claudication in peripheral vascular disease ([place]) 12/22/2019   Arthritis of carpometacarpal (CMC) joint of left thumb 10/28/2019   Hand arthritis 10/01/2019   Statin myopathy 08/30/2019   SOB (shortness of breath) on exertion 06/19/2019   Muscle cramps 06/19/2019   Hyperlipidemia 04/08/2019   S/P CABG x 5 01/05/2019   Left leg pain 09/30/2018   Degenerative joint disease of low back 01/09/2017   Routine general medical examination at a health care facility 10/02/2016   GERD (gastroesophageal reflux disease) 07/28/2016   H/O malignant neoplasm of breast 06/19/2016    History obtained from: chart review and patient.  Jessica [name] was referred by Hoyt Koch, MD.     Jessica [name] is a 70 y.o. female presenting for an evaluation of excessive tearing .   She had dry eyes ten years ago. Then four years ago, she started having tearing. She has ahd a tissue in her hands to dab her eyes. She has had issues throughout the year. She had tried a number of eye drops to help. She does have runny nose and takes Claritin. She has been on this for years. It is hard for her to notice whether this helps at all. She avoids animals. She retired in 2016 and was working in [employer].   She saw three different eye doctors and everything has been normal. She had her tear ducts checked and nothing was wrong.  In  total, she has been to 4 different eye doctors.  She grew up in [place] [place] and then she moved to this area. She has lived in the [place] area as well.  She does occasionally have some sneezing, but otherwise her atopic history is largely unremarkable.  Otherwise, there is no history of other atopic diseases, including asthma, food allergies, drug allergies, stinging insect allergies, eczema, urticaria, or contact dermatitis. There is no significant infectious history. Vaccinations are up to date.    Past Medical History: Patient Active Problem List   Diagnosis Date  Noted   Lymphadenopathy of head and neck 06/16/2021   Eye drainage 03/17/2021   Fatigue 01/27/2021   Pain and swelling of right upper extremity 06/05/2020   Cervical disc disorder with radiculopathy of cervical region 05/24/2020   Post inflammatory hypopigmentation 12/30/2019   Claudication in peripheral vascular disease ([place]) 12/22/2019   Arthritis of carpometacarpal (CMC) joint of left thumb 10/28/2019   Hand arthritis 10/01/2019   Statin myopathy 08/30/2019   SOB (shortness of breath) on exertion 06/19/2019   Muscle cramps 06/19/2019   Hyperlipidemia 04/08/2019   S/P CABG x 5 01/05/2019   Left leg pain 09/30/2018   Degenerative joint disease of low back 01/09/2017   Routine general medical examination at a health care facility 10/02/2016   GERD (gastroesophageal reflux disease) 07/28/2016   H/O malignant neoplasm of breast 06/19/2016    Medication List:  Allergies as of 06/20/2021       Reactions   Codeine Nausea And Vomiting   Repatha [evolocumab]    MYALGIAS   Sulfa Antibiotics Swelling   Sulfacetamide Swelling        Medication List        Accurate as of June 20, 2021  1:19 PM. If you have any questions, ask your nurse or doctor.          aspirin EC 81 MG tablet Take 1 tablet (81 mg total) by mouth daily. Swallow whole.   cetirizine 10 MG tablet Commonly known as: ZYRTEC Take 1 tablet (10 mg total) by mouth daily. Started by: Valentina Shaggy, MD   ezetimibe 10 MG tablet Commonly known as: ZETIA Take 1 tablet (10 mg total) by mouth daily.   folic acid 1 MG tablet Commonly known as: FOLVITE Take 1 mg by mouth daily.   loratadine 10 MG tablet Commonly known as: CLARITIN Take 10 mg by mouth every evening.   multivitamin with minerals Tabs tablet Take 1 tablet by mouth daily.   vitamin C 1000 MG tablet Take 2,000 mg by mouth daily.   Vitamin D-3 125 MCG (5000 UT) Tabs Take 5,000 Units by mouth daily.        Birth History:  non-contributory  Developmental History: non-contributory  Past Surgical History: Past Surgical History:  Procedure Laterality Date   ABDOMINAL HYSTERECTOMY     BREAST LUMPECTOMY Right 2000   BREAST SURGERY Right    calcifaction removed   BUNIONECTOMY Bilateral    COLONOSCOPY W/ POLYPECTOMY     CORONARY ARTERY BYPASS GRAFT N/A 01/05/2019   Procedure: CORONARY ARTERY BYPASS GRAFTING (CABG), using right leg saphenous endoscopic and open vein harvest, exploration left leg;  Surgeon: Gaye Pollack, MD;  Location: [facility];  Service: Open Heart Surgery;  Laterality: N/A;   EYE SURGERY Bilateral    cataract   hemorrhoid     LEFT HEART CATH AND CORONARY ANGIOGRAPHY N/A 01/01/2019   Procedure: LEFT HEART CATH AND CORONARY ANGIOGRAPHY;  Surgeon: Irish Lack,  Charlann Lange, MD;  Location: [facility];  Service: Cardiovascular;  Laterality: N/A;   STERNAL WIRES REMOVAL N/A 05/11/2019   Procedure: STERNAL WIRES REMOVAL;  Surgeon: Gaye Pollack, MD;  Location: [facility];  Service: Thoracic;  Laterality: N/A;   TEE WITHOUT CARDIOVERSION N/A 01/05/2019   Procedure: TRANSESOPHAGEAL ECHOCARDIOGRAM (TEE);  Surgeon: Gaye Pollack, MD;  Location: [facility];  Service: Open Heart Surgery;  Laterality: N/A;     Family History: Family History  Problem Relation Age of Onset   Heart disease Brother    Asthma Paternal Aunt    Diabetes Paternal Aunt    Stroke Paternal Uncle    Kidney disease Paternal Uncle    Heart attack Father 62     Social History: Kat lives at home with her husband. She does not have children.  She lives in a house that is 79 years old.  There is wood in the main living areas and as well as the bedroom.  They have electric heating and central cooling.  There are no animals inside or outside of the home.  There are no dust mite covers on the bedding.  There is no tobacco exposure.  She is not exposed to fumes, chemicals, or dust.  There is no HEPA filter.  She does not live near an  interstate or industrial area.   Review of Systems  Constitutional: Negative.  Negative for chills, fever, malaise/fatigue and weight loss.  HENT: Negative.  Negative for congestion, ear discharge, ear pain and sore throat.   Eyes:  Negative for pain, discharge and redness.  Respiratory:  Negative for cough, sputum production, shortness of breath and wheezing.   Cardiovascular: Negative.  Negative for chest pain and palpitations.  Gastrointestinal:  Negative for abdominal pain, constipation, diarrhea, heartburn, nausea and vomiting.  Skin: Negative.  Negative for itching and rash.  Neurological:  Negative for dizziness and headaches.  Endo/Heme/Allergies:  Negative for environmental allergies. Does not bruise/bleed easily.  All other systems reviewed and are negative.     Objective:   Blood pressure 130/72, pulse 62, temperature (!) 97.1 F (36.2 C), temperature source Temporal, resp. rate 16, height '5\' 6"'$  (1.676 m), weight 183 lb 6.4 oz (83.2 kg), SpO2 97 %. Body mass index is 29.6 kg/m.   Physical Exam:   Physical Exam Constitutional:      Appearance: She is well-developed.  HENT:     Head: Normocephalic and atraumatic.     Right Ear: Tympanic membrane, ear canal and external ear normal. No drainage, swelling or tenderness. Tympanic membrane is not injected, scarred, erythematous, retracted or bulging.     Left Ear: Tympanic membrane, ear canal and external ear normal. No drainage, swelling or tenderness. Tympanic membrane is not injected, scarred, erythematous, retracted or bulging.     Nose: Rhinorrhea present. No nasal deformity, septal deviation or mucosal edema.     Right Turbinates: Pale.     Left Turbinates: Pale.     Right Sinus: No maxillary sinus tenderness or frontal sinus tenderness.     Left Sinus: No maxillary sinus tenderness or frontal sinus tenderness.     Mouth/Throat:     Mouth: Mucous membranes are not pale and not dry.     Pharynx: Uvula midline.   Eyes:     General: Lids are normal. No allergic shiner.       Right eye: No discharge.        Left eye: No discharge.     Conjunctiva/sclera:  Conjunctivae normal.     Right eye: Right conjunctiva is not injected. No chemosis.    Left eye: Left conjunctiva is not injected. No chemosis.    Pupils: Pupils are equal, round, and reactive to light.     Comments: Excessive tearing.  Cardiovascular:     Rate and Rhythm: Normal rate and regular rhythm.     Heart sounds: Normal heart sounds.  Pulmonary:     Effort: Pulmonary effort is normal. No tachypnea, accessory muscle usage or respiratory distress.     Breath sounds: Normal breath sounds. No wheezing, rhonchi or rales.  Chest:     Chest wall: No tenderness.  Abdominal:     Tenderness: There is no abdominal tenderness. There is no guarding or rebound.  Lymphadenopathy:     Head:     Right side of head: No submandibular, tonsillar or occipital adenopathy.     Left side of head: No submandibular, tonsillar or occipital adenopathy.     Cervical: No cervical adenopathy.  Skin:    Coloration: Skin is not pale.     Findings: No abrasion, erythema, petechiae or rash. Rash is not papular, urticarial or vesicular.  Neurological:     Mental Status: She is alert.     Diagnostic studies:    Allergy Studies:     Airborne Adult Perc - 06/20/21 0943     Time Antigen Placed [id number]    Allergen Manufacturer Lavella Hammock    Location Back    Number of Test 59    1. Control-Buffer 50% Glycerol Negative    2. Control-Histamine 1 mg/ml 2+    3. Albumin saline Negative    4. Oak Ridge Negative    5. [place] Negative    6. Johnson Negative    7. Linganore Blue Negative    8. Meadow Fescue Negative    9. Perennial Rye Negative    10. Sweet Vernal Negative    11. Timothy Negative    12. Cocklebur Negative    13. Burweed Marshelder Negative    14. Ragweed, short Negative    15. Ragweed, Giant Negative    16. Plantain,  English Negative    17. Lamb's  Quarters Negative    18. Sheep Sorrell Negative    19. Rough Pigweed Negative    20. Marsh Elder, Rough Negative    21. Mugwort, Common Negative    22. Ash mix Negative    23. Birch mix Negative    24. Beech American Negative    25. Box, Elder Negative    26. Cedar, red Negative    27. Cottonwood, [place] Negative    28. Elm mix Negative    29. Hickory Negative    30. Maple mix Negative    31. Oak, [place] mix Negative    32. Pecan Pollen Negative    33. Pine mix Negative    34. Sycamore Eastern Negative    35. Manson, Black Pollen Negative    36. Alternaria alternata Negative    37. Cladosporium Herbarum Negative    38. Aspergillus mix Negative    39. Penicillium mix Negative    40. Bipolaris sorokiniana (Helminthosporium) Negative    41. Drechslera spicifera (Curvularia) Negative    42. Mucor plumbeus Negative    43. Fusarium moniliforme Negative    44. Aureobasidium pullulans (pullulara) Negative    45. Rhizopus oryzae Negative    46. Botrytis cinera Negative    47. Epicoccum nigrum Negative    48. Phoma betae Negative  49. Candida Albicans Negative    50. Trichophyton mentagrophytes Negative    51. Mite, D Farinae  5,000 AU/ml Negative    52. Mite, D Pteronyssinus  5,000 AU/ml Negative    53. Cat Hair 10,000 BAU/ml Negative    54.  Dog Epithelia Negative    55. Mixed Feathers Negative    56. Horse Epithelia Negative    57. Cockroach, German Negative    58. Mouse Negative    59. Tobacco Leaf Negative             Allergy testing results were read and interpreted by myself, documented by clinical staff.         Salvatore Marvel, MD [facility]

## 2021-06-20 NOTE — Patient Instructions (Addendum)
1. Excessive tear production of both lacrimal glands - Testing today showed: negative to the entire panel  - Copy of test results provided.  - Avoidance measures provided. - Stop taking: Claritin - Start taking: Zyrtec (cetirizine) '10mg'$  tablet once daily - You can use an extra dose of the antihistamine, if needed, for breakthrough symptoms.  - Let us know if this works better.  - Call us in 1-2 weeks with an update.  2. Return in about 3 months (around 09/20/2021).    Please inform us of any Emergency Department visits, hospitalizations, or changes in symptoms. Call us before going to the ED for breathing or allergy symptoms since we might be able to fit you in for a sick visit. Feel free to contact us anytime with any questions, problems, or concerns.  It was a pleasure to meet you today!  Websites that have reliable patient information: 1. American Academy of Asthma, Allergy, and Immunology: www.aaaai.org 2. Food Allergy Research and Education (FARE): foodallergy.org 3. Mothers of Asthmatics: http://www.asthmacommunitynetwork.org 4. American College of Allergy, Asthma, and Immunology: www.acaai.org   COVID-19 Vaccine Information can be found at: ShippingScam.co.uk For questions related to vaccine distribution or appointments, please email [email or web address] or call [phone].   We realize that you might be concerned about having an allergic reaction to the COVID19 vaccines. To help with that concern, WE ARE OFFERING THE COVID19 VACCINES IN OUR OFFICE! Ask the front desk for dates!     "Like" [place] on Facebook and Instagram for our latest updates!      A healthy democracy works best when [employer] participate! Make sure you are registered to vote! If you have moved or changed any of your contact information, you will need to get this updated before voting!  In some cases, you MAY be able to register to vote online:  [email or web address]

## 2021-06-22 ENCOUNTER — Encounter: Payer: Self-pay | Admitting: Allergy & Immunology

## 2021-07-09 ENCOUNTER — Encounter: Payer: Self-pay | Admitting: Internal Medicine

## 2021-07-10 ENCOUNTER — Telehealth: Payer: Self-pay | Admitting: Pharmacist

## 2021-07-10 NOTE — Telephone Encounter (Signed)
Called pt and left message. We are now able to start scheduling Leqvio injections again. Prior auth already approved for Leqvio for pt, she will have $40 copay. Will schedule appt at infusion center at [facility] when pt returns call.

## 2021-07-11 ENCOUNTER — Other Ambulatory Visit: Payer: Self-pay | Admitting: Internal Medicine

## 2021-07-11 DIAGNOSIS — Z1231 Encounter for screening mammogram for malignant neoplasm of breast: Secondary | ICD-10-CM

## 2021-07-12 NOTE — Telephone Encounter (Signed)
2nd call made, no answer.

## 2021-07-14 ENCOUNTER — Other Ambulatory Visit: Payer: Self-pay

## 2021-07-14 ENCOUNTER — Ambulatory Visit (INDEPENDENT_AMBULATORY_CARE_PROVIDER_SITE_OTHER): Payer: Medicare PPO

## 2021-07-14 DIAGNOSIS — Z23 Encounter for immunization: Secondary | ICD-10-CM | POA: Diagnosis not present

## 2021-07-14 NOTE — Progress Notes (Signed)
Pt was given High Dose Flu vaccine w/o any complications.

## 2021-07-19 NOTE — Telephone Encounter (Signed)
Called pt again. She is not interested in starting Leqvio at this time. I provided her with my direct # in case she changes her mind in the future. Of note, she does already have a prior authorization for Leqvio approved through 10/21/21 with her [facility] plan, with $40 copay per injection.

## 2021-08-01 DIAGNOSIS — M06842 Other specified rheumatoid arthritis, left hand: Secondary | ICD-10-CM | POA: Diagnosis not present

## 2021-08-01 DIAGNOSIS — M06841 Other specified rheumatoid arthritis, right hand: Secondary | ICD-10-CM | POA: Diagnosis not present

## 2021-08-07 ENCOUNTER — Other Ambulatory Visit: Payer: Self-pay

## 2021-08-07 ENCOUNTER — Encounter: Payer: Self-pay | Admitting: Podiatry

## 2021-08-07 ENCOUNTER — Ambulatory Visit: Payer: Medicare PPO | Admitting: Podiatry

## 2021-08-07 ENCOUNTER — Ambulatory Visit (INDEPENDENT_AMBULATORY_CARE_PROVIDER_SITE_OTHER): Payer: Medicare PPO

## 2021-08-07 DIAGNOSIS — M778 Other enthesopathies, not elsewhere classified: Secondary | ICD-10-CM

## 2021-08-07 DIAGNOSIS — M0579 Rheumatoid arthritis with rheumatoid factor of multiple sites without organ or systems involvement: Secondary | ICD-10-CM

## 2021-08-07 DIAGNOSIS — M21619 Bunion of unspecified foot: Secondary | ICD-10-CM

## 2021-08-07 DIAGNOSIS — M2042 Other hammer toe(s) (acquired), left foot: Secondary | ICD-10-CM

## 2021-08-07 DIAGNOSIS — M2041 Other hammer toe(s) (acquired), right foot: Secondary | ICD-10-CM | POA: Diagnosis not present

## 2021-08-07 MED ORDER — DEXAMETHASONE SODIUM PHOSPHATE 120 MG/30ML IJ SOLN
4.0000 mg | Freq: Once | INTRAMUSCULAR | Status: AC
Start: 2021-08-07 — End: 2021-08-07
  Administered 2021-08-07: 4 mg via INTRA_ARTICULAR

## 2021-08-07 NOTE — Progress Notes (Signed)
  Subjective:  Patient ID: Jessica [name], female    DOB: Feb 24, 1951,   MRN: [id number]  Chief Complaint  Patient presents with   Foot Pain    Plantar forefoot left - tightness, pulling, aching x 1 month, no injury, tried Tylenol, Dx: RA in hands couple months ago, doc put on folic acid and another med-can't remember   New Patient (Initial Visit)    Est pt 11/2017    70 y.o. female presents for left and right foot pain. Primarily in the left. Relates she was recently diagnoses with RA three months ago. Now having pain in her feet around her big toes and second toe joints. Denies any treatments currently.  . Denies any other pedal complaints. Denies n/v/f/c.   Past Medical History:  Diagnosis Date   Allergy    Breast cancer ([place]) 2000   Cancer ([place])    breast   Colon polyp    Coronary artery disease    GERD (gastroesophageal reflux disease)    History of blood transfusion    Hyperlipidemia    Migraine    history of migrarines, none as an adult   Personal history of radiation therapy 2000    Objective:  Physical Exam: Vascular: DP/PT pulses 2/4 bilateral. CFT <3 seconds. Normal hair growth on digits. No edema.  Skin. No lacerations or abrasions bilateral feet.  Musculoskeletal: MMT 5/5 bilateral lower extremities in DF, PF, Inversion and Eversion. Deceased ROM in DF of ankle joint. HAV and hammertoe defromites Tender to first mpj bilateral and left second mpj. Pain with rom.  Neurological: Sensation intact to light touch.   Assessment:   1. Capsulitis of foot, left   2. Rheumatoid arthritis involving multiple sites with positive rheumatoid factor ([place])      Plan:  Patient was evaluated and treated and all questions answered. Discussed capsulitis and inflammation and RA of joint and treatment options with patient.  Radiographs reviewed and discussed with patient. HAV deformity with elongated second metatarsal.  Injection offered today. Patient in agreement.   Discussed stiff sole shoes and recommend use of carbon fiber foot plate.  Discussed taping and demonstrated taping of the toe.  Discussed if pain does not improve may consider PT and/or MRI for further surgical planning.  Patient to return in 6 weeks or sooner if concerns arise.   Procedure: Injection Tendon/Ligament Discussed alternatives, risks, complications and verbal consent was obtained.  Location: Left second MPJ. Skin Prep: Alcohol. Injectate: 1cc 0.5% marcaine plain, 1 cc dexamethasone.  Disposition: Patient tolerated procedure well. Injection site dressed with a band-aid.  Post-injection care was discussed and return precautions discussed.     Lorenda Peck, DPM

## 2021-08-29 NOTE — Telephone Encounter (Signed)
Pt called clinic, now wishes to start on [place]. Is available most of the month for her first injection. Confirmed she is fine with $40 copay and with driving to [facility] for injection.

## 2021-08-29 NOTE — Telephone Encounter (Signed)
Called the pt and tried to 3 way call the infusion clinic 4 x's but none answered I will try to do it again tomorrow to get the leqvio injection scheduled pt voiced understanding

## 2021-08-30 ENCOUNTER — Other Ambulatory Visit: Payer: Self-pay

## 2021-08-30 DIAGNOSIS — E785 Hyperlipidemia, unspecified: Secondary | ICD-10-CM

## 2021-08-30 DIAGNOSIS — G720 Drug-induced myopathy: Secondary | ICD-10-CM

## 2021-08-30 NOTE — Telephone Encounter (Signed)
3 way called the pt w/infusion clinic to set up leqvio 1st injection pt voiced understanding

## 2021-09-06 ENCOUNTER — Ambulatory Visit (HOSPITAL_COMMUNITY)
Admission: RE | Admit: 2021-09-06 | Discharge: 2021-09-06 | Disposition: A | Discharge status: Home / Self Care | Payer: Medicare PPO | Source: Ambulatory Visit | Attending: Cardiovascular Disease | Admitting: Cardiovascular Disease

## 2021-09-06 DIAGNOSIS — G720 Drug-induced myopathy: Secondary | ICD-10-CM | POA: Diagnosis not present

## 2021-09-06 DIAGNOSIS — T466X5A Adverse effect of antihyperlipidemic and antiarteriosclerotic drugs, initial encounter: Secondary | ICD-10-CM | POA: Insufficient documentation

## 2021-09-06 DIAGNOSIS — E785 Hyperlipidemia, unspecified: Secondary | ICD-10-CM | POA: Diagnosis not present

## 2021-09-06 MED ORDER — INCLISIRAN SODIUM 284 MG/1.5ML ~~LOC~~ SOSY
284.0000 mg | PREFILLED_SYRINGE | Freq: Once | SUBCUTANEOUS | Status: AC
  Administered 2021-09-06: 284 mg via SUBCUTANEOUS

## 2021-09-06 MED ORDER — INCLISIRAN SODIUM 284 MG/1.5ML ~~LOC~~ SOSY
PREFILLED_SYRINGE | SUBCUTANEOUS | Status: AC
  Filled 2021-09-06: qty 1.5

## 2021-09-18 ENCOUNTER — Ambulatory Visit: Payer: Medicare PPO | Admitting: Podiatry

## 2021-09-21 ENCOUNTER — Ambulatory Visit: Payer: Medicare PPO | Admitting: Allergy & Immunology

## 2021-09-26 NOTE — Progress Notes (Signed)
I, Jessica [name], LAT, ATC, am serving as scribe for Dr. Lynne Leader.  Jessica [name] is a 70 y.o. female who presents to [place] at [facility] today for L thumb pain.  She was last seen by Dr. Georgina Snell on 01/16/21 for R arm pain.  Today, pt reports L thumb pain x months.  She locates her pain to the MCP joint of the L thumb and slightly into the thenar eminence.  L thumb swelling: no Aggravating factors: picking things up Treatments tried: brace, Tylenol  Pt also c/o R hand pain ongoing for several months. Pt locates pain to the MCP joint of the 2nd finger.  Pt notes there is swelling at her second MCP associate with stiffness and pain.  Last year she was seen by another orthopedic or sports medicine provider and was referred to hand therapy and did pretty well.  She is interested in doing hand therapy for both of these issues if possible.  Pertinent review of systems: No fevers or chills  Relevant historical information: CABG and CAD.  No personal or family history of rheumatologic disease. History of breast cancer.  Exam:  BP 122/78   Pulse 64   Ht 5\' 6"  (1.676 m)   Wt 185 lb 6.4 oz (84.1 kg)   SpO2 98%   BMI 29.92 kg/m  General: Well Developed, well nourished, and in no acute distress.   MSK:  Right hand: Swelling at second MCP.  However patient does have some mild swelling diffusely across MCPs.  No significant ulnar deviation. Second MCP tender palpation with decreased flexion. Pulses cap refill and sensation are intact distally.  Left hand: Swelling and tender at first [facility].  Decreased thumb motion. Mild MCP swelling throughout.  No ulnar deviation. Pulses capillary fill and sensation are intact distally.   Lab and Radiology Results  X-ray images bilateral hands are obtained today personally and independently interpreted.  Right hand: Mild DJD first CMC.  No significant DJD at second MCP.  No erosive change present.  No fractures  present.  Left hand: Significant DJD first CMC.  No acute fractures..  Await formal radiology review   Assessment and Plan: 70 y.o. female with  Bilateral hand pain. Right hand pain is predominantly at second MCP where she does appear to have some synovitis.  This is somewhat concerning for rheumatologic disorder.  Plan for rheumatologic work-up with x-ray and the labs listed below.  Treatment primarily with heat, and hand therapy as well as Voltaren gel.  Recheck back in 2 months.  Certainly can proceed with steroid injection sooner if needed.  Left hand pain: Primarily pain is located at first [facility] where patient does have significant DJD on x-ray.  Discussed treatment plan and options.  Patient would like to try hand therapy and Voltaren gel first which is reasonable.  If not better certainly steroid injection would be helpful.  Recheck in 2 months for return sooner if needed for injection.   PDMP not reviewed this encounter. Orders Placed This Encounter  Procedures   DG Hand Complete Left    Standing Status:   Future    Number of Occurrences:   1    Standing Expiration Date:   09/27/2022    Order Specific Question:   Reason for Exam (SYMPTOM  OR DIAGNOSIS REQUIRED)    Answer:   bilateral hand pain    Order Specific Question:   Preferred imaging location?    Answer:   [facility]  DG Hand Complete Right    Standing Status:   Future    Number of Occurrences:   1    Standing Expiration Date:   09/27/2022    Order Specific Question:   Reason for Exam (SYMPTOM  OR DIAGNOSIS REQUIRED)    Answer:   bilateral hand pain    Order Specific Question:   Preferred imaging location?    Answer:   [facility]   ANA    Standing Status:   Future    Number of Occurrences:   1    Standing Expiration Date:   76/11/8313   Cyclic citrul peptide antibody, IgG    Standing Status:   Future    Number of Occurrences:   1    Standing Expiration Date:   09/27/2022   Rheumatoid factor     Standing Status:   Future    Number of Occurrences:   1    Standing Expiration Date:   09/27/2022   Sedimentation rate    Standing Status:   Future    Number of Occurrences:   1    Standing Expiration Date:   09/27/2022   Ambulatory referral to Occupational Therapy    Referral Priority:   Routine    Referral Type:   Occupational Therapy    Referral Reason:   Specialty Services Required    Requested Specialty:   Occupational Therapy    Number of Visits Requested:   1   No orders of the defined types were placed in this encounter.    Discussed warning signs or symptoms. Please see discharge instructions. Patient expresses understanding.   The above documentation has been reviewed and is accurate and complete Lynne Leader, M.D.

## 2021-09-27 ENCOUNTER — Other Ambulatory Visit: Payer: Self-pay

## 2021-09-27 ENCOUNTER — Ambulatory Visit: Payer: Medicare PPO | Admitting: Family Medicine

## 2021-09-27 ENCOUNTER — Ambulatory Visit: Payer: Self-pay

## 2021-09-27 ENCOUNTER — Ambulatory Visit (INDEPENDENT_AMBULATORY_CARE_PROVIDER_SITE_OTHER): Payer: Medicare PPO

## 2021-09-27 VITALS — BP 122/78 | HR 64 | Ht 66.0 in | Wt 185.4 lb

## 2021-09-27 DIAGNOSIS — R768 Other specified abnormal immunological findings in serum: Secondary | ICD-10-CM

## 2021-09-27 DIAGNOSIS — M79642 Pain in left hand: Secondary | ICD-10-CM | POA: Diagnosis not present

## 2021-09-27 DIAGNOSIS — M7989 Other specified soft tissue disorders: Secondary | ICD-10-CM

## 2021-09-27 DIAGNOSIS — M79641 Pain in right hand: Secondary | ICD-10-CM | POA: Diagnosis not present

## 2021-09-27 DIAGNOSIS — M19049 Primary osteoarthritis, unspecified hand: Secondary | ICD-10-CM

## 2021-09-27 DIAGNOSIS — M1812 Unilateral primary osteoarthritis of first carpometacarpal joint, left hand: Secondary | ICD-10-CM

## 2021-09-27 LAB — SEDIMENTATION RATE: Sed Rate: 43 mm/hr — ABNORMAL HIGH (ref 0–30)

## 2021-09-27 NOTE — Patient Instructions (Addendum)
Thank you for coming in today.   Please use Voltaren gel (Generic Diclofenac Gel) up to 4x daily for pain as needed.  This is available over-the-counter as both the name brand Voltaren gel and the generic diclofenac gel.   Please get labs today before you leave   Recheck back in 2 months, sooner if needed for a steroid injection

## 2021-09-28 NOTE — Progress Notes (Signed)
Right hand x-ray looks normal to radiology

## 2021-09-28 NOTE — Progress Notes (Signed)
Left hand x-ray shows medium arthritis at the base of the thumb like I showed you in clinic.

## 2021-09-29 LAB — ANTI-NUCLEAR AB-TITER (ANA TITER): ANA Titer 1: 1:80 {titer} — ABNORMAL HIGH

## 2021-09-29 LAB — RHEUMATOID FACTOR: Rheumatoid fact SerPl-aCnc: 159 IU/mL — ABNORMAL HIGH (ref <14)

## 2021-09-29 LAB — ANA: Anti Nuclear Antibody (ANA): POSITIVE — AB

## 2021-09-29 LAB — CYCLIC CITRUL PEPTIDE ANTIBODY, IGG: Cyclic Citrullin Peptide Ab: >250 UNITS — ABNORMAL HIGH

## 2021-09-29 NOTE — Progress Notes (Addendum)
Rheumatology labs are positive.  Your labs are very concerning for a rheumatology process such as rheumatoid arthritis. I have referred you to a rheumatologist.  You should hear about an appointment soon.  Results for orders placed or performed in visit on 09/27/21 (from the past 72 hour(s))  Sedimentation rate     Status: Abnormal   Collection Time: 09/27/21 11:10 AM  Result Value Ref Range   Sed Rate 43 (H) 0 - 30 mm/hr  Rheumatoid factor     Status: Abnormal   Collection Time: 09/27/21 11:10 AM  Result Value Ref Range   Rhuematoid fact SerPl-aCnc 159 (H) <83 IU/mL  Cyclic citrul peptide antibody, IgG     Status: Abnormal   Collection Time: 09/27/21 11:10 AM  Result Value Ref Range   Cyclic Citrullin Peptide Ab >250 (H) UNITS    Comment: Reference Range Negative:            <20 Weak Positive:       20-39 Moderate Positive:   40-59 Strong Positive:     >59 .   ANA     Status: Abnormal   Collection Time: 09/27/21 11:10 AM  Result Value Ref Range   Anti Nuclear Antibody (ANA) POSITIVE (A) NEGATIVE    Comment: ANA IFA is a first line screen for detecting the presence of up to approximately 150 autoantibodies in various autoimmune diseases. A positive ANA IFA result is suggestive of autoimmune disease and reflexes to titer and pattern. Further laboratory testing may be considered if clinically indicated. . For additional information, please refer to http://education.[employer].com/faq/FAQ177 (This link is being provided for informational/ educational purposes only.) .   Anti-nuclear ab-titer (ANA titer)     Status: Abnormal   Collection Time: 09/27/21 11:10 AM  Result Value Ref Range   ANA Titer 1 1:80 (H) titer    Comment: A low level ANA titer may be present in pre-clinical autoimmune diseases and normal individuals.                 Reference Range                 <1:40        Negative                 1:40-1:80    Low Antibody Level                 >1:80         Elevated Antibody Level .    ANA Pattern 1 Nuclear, Speckled (A)     Comment: Speckled pattern is associated with mixed connective tissue disease (MCTD), systemic lupus erythematosus (SLE), Sjogren's syndrome, dermatomyositis, and  systemic sclerosis/polymyositis overlap. . AC-2,4,5,29: Speckled . International Consensus on ANA Patterns (https://www.hernandez-brewer.com/)

## 2021-09-29 NOTE — Addendum Note (Signed)
Addended by: Gregor Hams on: 09/29/2021 12:15 PM   Modules accepted: Orders

## 2021-09-30 ENCOUNTER — Encounter: Payer: Self-pay | Admitting: Internal Medicine

## 2021-10-03 ENCOUNTER — Other Ambulatory Visit: Payer: Self-pay

## 2021-10-03 ENCOUNTER — Ambulatory Visit: Payer: Medicare PPO | Attending: Family Medicine

## 2021-10-03 DIAGNOSIS — M25641 Stiffness of right hand, not elsewhere classified: Secondary | ICD-10-CM

## 2021-10-03 DIAGNOSIS — T466X5A Adverse effect of antihyperlipidemic and antiarteriosclerotic drugs, initial encounter: Secondary | ICD-10-CM

## 2021-10-03 DIAGNOSIS — R278 Other lack of coordination: Secondary | ICD-10-CM | POA: Diagnosis not present

## 2021-10-03 DIAGNOSIS — Z951 Presence of aortocoronary bypass graft: Secondary | ICD-10-CM

## 2021-10-03 DIAGNOSIS — M79645 Pain in left finger(s): Secondary | ICD-10-CM

## 2021-10-03 DIAGNOSIS — M6281 Muscle weakness (generalized): Secondary | ICD-10-CM | POA: Diagnosis not present

## 2021-10-03 DIAGNOSIS — E785 Hyperlipidemia, unspecified: Secondary | ICD-10-CM

## 2021-10-03 DIAGNOSIS — G720 Drug-induced myopathy: Secondary | ICD-10-CM

## 2021-10-04 NOTE — Therapy (Signed)
[place] MAIN [facility] SERVICES [address] [place], [place], [address] Phone: [phone]   Fax:  [phone]  Occupational Therapy Evaluation  Patient Details  Name: Jessica [name] MRN: [id number] Date of Birth: [birth year] Referring Provider (OT): Dr. Lynne Leader (ortho)   Encounter Date: 10/03/2021   OT End of Session - 10/04/21 1001     Visit Number 1    Number of Visits 12    Date for OT Re-Evaluation 11/13/21    OT Start Time 0821    OT Stop Time 0917    OT Time Calculation (min) 56 min    Activity Tolerance Patient tolerated treatment well    Behavior During Therapy [facility] for tasks assessed/performed             Past Medical History:  Diagnosis Date   Allergy    Breast cancer ([place]) 2000   Cancer ([place])    breast   Colon polyp    Coronary artery disease    GERD (gastroesophageal reflux disease)    History of blood transfusion    Hyperlipidemia    Migraine    history of migrarines, none as an adult   Personal history of radiation therapy 2000    Past Surgical History:  Procedure Laterality Date   ABDOMINAL HYSTERECTOMY     BREAST LUMPECTOMY Right 2000   BREAST SURGERY Right    calcifaction removed   BUNIONECTOMY Bilateral    COLONOSCOPY W/ POLYPECTOMY     CORONARY ARTERY BYPASS GRAFT N/A 01/05/2019   Procedure: CORONARY ARTERY BYPASS GRAFTING (CABG), using right leg saphenous endoscopic and open vein harvest, exploration left leg;  Surgeon: Gaye Pollack, MD;  Location: [place];  Service: Open Heart Surgery;  Laterality: N/A;   EYE SURGERY Bilateral    cataract   hemorrhoid     LEFT HEART CATH AND CORONARY ANGIOGRAPHY N/A 01/01/2019   Procedure: LEFT HEART CATH AND CORONARY ANGIOGRAPHY;  Surgeon: Jettie Booze, MD;  Location: [place] CV LAB;  Service: Cardiovascular;  Laterality: N/A;   STERNAL WIRES REMOVAL N/A 05/11/2019   Procedure: STERNAL WIRES REMOVAL;  Surgeon: Gaye Pollack, MD;  Location:  [facility] OR;  Service: Thoracic;  Laterality: N/A;   TEE WITHOUT CARDIOVERSION N/A 01/05/2019   Procedure: TRANSESOPHAGEAL ECHOCARDIOGRAM (TEE);  Surgeon: Gaye Pollack, MD;  Location: [place];  Service: Open Heart Surgery;  Laterality: N/A;    There were no vitals filed for this visit.   Subjective Assessment - 10/03/21 0957     Subjective  "I didn't have these knots on my hands before when I tried therapy.  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  New labs indicate likeliness of RA.    Patient Stated Goals "I want to be able to use my hands without injuring them more."    Currently in Pain? No/denies    Pain Score  0-No pain   pt reports no pain today, but yesterday they were 10/10 in bilat hands.  Pain is intermittent.   Pain Onset More than a month ago               [facility] OT Assessment - 10/04/21 0001       Assessment   Medical Diagnosis arthritis bilat hands    Referring Provider (OT) Dr. Lynne Leader (ortho)    Onset Date/Surgical Date 07/22/21    Hand Dominance Right    Next MD Visit Feb return to Dr. Georgina Snell; planning to follow up with rheumatologist but nothing scheduled yet    Prior [facility] [address] in Feb of 2022      Precautions   Precautions None    Required Braces or Orthoses Other Brace/Splint    Other Brace/Splint Pt reports she's tried 5 or 6 hand braces      Restrictions   Weight Bearing Restrictions No      Balance Screen   Has the patient fallen in the past 6 months  No      Home  Environment   Family/patient expects to be discharged to: Private residence    Living Arrangements Spouse/significant other    Lives With Spouse      Prior Function   Level of Independence Independent with basic ADLs    Vocation Retired    Biomedical scientist retired Education officer, museum    Leisure reading, working in the yard [employer], painting      ADL   ADL comments Pt manages all ADLs/IADLs but with intermittent severe pain in bilat hands.  Occasional assist to open jars/containers/cut food      Mobility   Mobility Status Independent      Written Expression   Dominant Hand Right    Handwriting 100% legible      Vision - History   Baseline Vision Wears glasses all the time      Cognition   Overall Cognitive Status Within Functional Limits for tasks assessed      Observation/Other Assessments   Focus on Therapeutic Outcomes (FOTO)  93      Sensation   Light Touch Appears Intact      Coordination   Gross Motor Movements are Fluid and Coordinated Yes    Fine Motor Movements are Fluid and Coordinated Yes    Right 9 Hole Peg Test 22 sec    Left 9 Hole Peg Test 21 sec      AROM   Overall AROM Comments R wrist flex 70, L 78; R wrist ext 52, L 62; bilat sup/pron WNL; R IF MP flex 100, PIP flex 95, DIP flex 75 (IF full extension all joints); L thumb CMC flex 65, IP flex 65, full abd and extension both joints      Strength   Right Hand Grip (lbs) 53    Right Hand Lateral Pinch 20 lbs    Right Hand 3 Point Pinch 15 lbs    Left Hand Grip (lbs) 57    Left Hand Lateral Pinch 14 lbs    Left Hand 3 Point Pinch 9 lbs            Occupational Therapy Evaluation: Pt is a 70 y/o female referred to outpatient OT d/t bilat hand pain, specifically L thumb CMC pain and R 2nd IF pain at the MCP joint.  Labs indicate likelihood of RA.  Pt does not yet have a follow up  scheduled with rheumatology, but states this is in the works.  Pt reports no pain today in hands,  but yesterday 10/10.  Pt had hand therapy briefly Feb of 2022, but reports new nodules and pain in L thumb CMC joint and R MCP joint of 2nd digit.  Pt has also had a decrease in R grip strength of 12 lbs, decrease in 3 point pinch strength in R hand of 2 lbs and L hand of 3 lbs since participating in therapy in Feb of this year.  Pt is wanting to learn strategies to manage pain and perform activities in a way that does not flare up painful episodes.  Pt will benefit from skilled OT for modalities/pain management/possible splinting, HEP instruction, and ADL/IADL training with focus on joint protection strategies.  Pt in agreement with OT poc.   Self Care: Instructed pt in joint protection strategies for bilat hands, including gripping objects and tools only as tightly as necessary, use built up handles, alternate activities frequently, use electric kitchen devices when able.  Pt verbalized understanding but further instruction needed.     OT Education - 10/03/21 1000     Education Details OT role, goals, poc, joint protection strategies    Person(s) Educated Patient    Methods Explanation;Verbal cues    Comprehension Verbal cues required;Verbalized understanding;Need further instruction              OT Short Term Goals - 10/04/21 1006       OT SHORT TERM GOAL #1   Title Pt to be indep with splinting schedule to decrease pain in bilat hands.    Baseline Eval: pt reports she has a number of splints that she inconsistently uses    Time 3    Period Weeks    Status New    Target Date 10/23/21      OT SHORT TERM GOAL #2   Title Pt will be indep with HEP for ROM and strengthening bilat hands.    Baseline Eval: not yet initiated.    Time 3    Period Weeks    Status New    Target Date 10/23/21               OT Long Term Goals - 10/04/21 1009       OT LONG TERM GOAL #1   Title Pt will be indep with joint protection strategies in order to better tolerate ADL/IADL/leisure  activities.    Baseline Eval: initiated education at eval, further training needed    Time 6    Period Weeks    Status New    Target Date 11/13/21      OT LONG TERM GOAL #2   Title Pt will tolerate modalities to reduce pain in bilat hands to <5/10 pain with ADL/IADL/leisure tasks.    Baseline Eval: pt reports 10/10 pain in bilat hands with activity yesterday, 0 at rest today.    Time 6    Period Weeks    Status New    Target Date 11/13/21      OT LONG TERM GOAL #3   Title Pt will increase FOTO score by 1-2 points to indicate increased functional performance.    Baseline Eval: FOTO 93    Time 6    Period Weeks    Status New    Target Date 11/13/21                   Plan - 10/04/21 1002  Clinical Impression Statement Pt is a 70 y/o female referred to outpatient OT d/t bilat hand pain, specifically L thumb CMC pain and R 2nd IF pain at the MCP joint.  Labs indicate likelihood of RA.  Pt does not yet have a follow up scheduled with rheumatology, but states this is in the works.  Pt reports no pain today in hands, but yesterday 10/10.  Pt had hand therapy briefly Feb of 2022, but reports new nodules and pain in L thumb CMC joint and R MCP joint of 2nd digit.  Pt has also had a decrease in R grip strength of 12 lbs, decrease in 3 point pinch strength in R hand of 2 lbs and L hand of 3 lbs since participating in therapy in Feb of this year.  Pt is wanting to learn strategies to manage pain and perform activities in a way that does not flare up painful episodes.  Pt will benefit from skilled OT for modalities/pain management/possible splinting, HEP instruction, and ADL/IADL training with focus on joint protection strategies.  Pt in agreement with OT poc.    OT Occupational Profile and History Problem Focused Assessment - Including review of records relating to presenting problem    Occupational performance deficits (Please refer to evaluation for details): ADL's;IADL's;Leisure;Social  Participation    Body Structure / Function / Physical Skills ADL;Edema;Flexibility;ROM;UE functional use;Pain;Strength;IADL;Decreased knowledge of precautions;Body mechanics;Decreased knowledge of use of DME;Dexterity;FMC    Rehab Potential Good    Clinical Decision Making Limited treatment options, no task modification necessary    Comorbidities Affecting Occupational Performance: May have comorbidities impacting occupational performance    Modification or Assistance to Complete Evaluation  No modification of tasks or assist necessary to complete eval    OT Frequency 2x / week    OT Duration 6 weeks    OT Treatment/Interventions Self-care/ADL training;Ultrasound;Paraffin;Fluidtherapy;Contrast Bath;DME and/or AE instruction;Manual Therapy;Splinting;Patient/family education;Therapeutic exercise;Iontophoresis;Moist Heat;Passive range of motion;Therapeutic activities    Plan Will plan OT 4-6 weeks    OT Home Exercise Plan not yet initiated; reviewed joint protection strategies    Consulted and Agree with Plan of Care Patient             Patient will benefit from skilled therapeutic intervention in order to improve the following deficits and impairments:   Body Structure / Function / Physical Skills: ADL, Edema, Flexibility, ROM, UE functional use, Pain, Strength, IADL, Decreased knowledge of precautions, Body mechanics, Decreased knowledge of use of DME, Dexterity, [facility]       Visit Diagnosis: Pain of left thumb  Stiffness of right hand, not elsewhere classified  Other lack of coordination    Problem List Patient Active Problem List   Diagnosis Date Noted   Lymphadenopathy of head and neck 06/16/2021   ANA positive 05/31/2021   Long-term use of immunosuppressant medication 05/31/2021   Eye drainage 03/17/2021   Fatigue 01/27/2021   Chronic pain of both shoulders 06/01/2020   Swelling of both hands 06/01/2020   Cervical disc disorder with radiculopathy of cervical region  05/24/2020   Post inflammatory hypopigmentation 12/30/2019   Claudication in peripheral vascular disease ([place]) 12/22/2019   Arthritis of carpometacarpal (CMC) joint of left thumb 10/28/2019   Hand arthritis 10/01/2019   Statin myopathy 08/30/2019   SOB (shortness of breath) on exertion 06/19/2019   Hyperlipidemia 04/08/2019   S/P CABG x 5 01/05/2019   Left leg pain 09/30/2018   Degenerative joint disease of low back 01/09/2017   Routine general medical examination at a  health care facility 10/02/2016   GERD (gastroesophageal reflux disease) 07/28/2016   H/O malignant neoplasm of breast 06/19/2016   Leta Speller, MS, OTR/L  Darleene Cleaver, OT 10/04/2021, 1:09 PM  [place] MAIN [facility] SERVICES [address] [place], [place], [address] Phone: [phone]   Fax:  [phone]  Name: Jessica [name] MRN: [id number] Date of Birth: Jan 08, 1951

## 2021-10-05 ENCOUNTER — Ambulatory Visit: Payer: Medicare PPO

## 2021-10-05 ENCOUNTER — Other Ambulatory Visit: Payer: Self-pay

## 2021-10-05 DIAGNOSIS — M6281 Muscle weakness (generalized): Secondary | ICD-10-CM | POA: Diagnosis not present

## 2021-10-05 DIAGNOSIS — M25641 Stiffness of right hand, not elsewhere classified: Secondary | ICD-10-CM | POA: Diagnosis not present

## 2021-10-05 DIAGNOSIS — R278 Other lack of coordination: Secondary | ICD-10-CM | POA: Diagnosis not present

## 2021-10-05 DIAGNOSIS — M79645 Pain in left finger(s): Secondary | ICD-10-CM | POA: Diagnosis not present

## 2021-10-06 NOTE — Therapy (Signed)
[place] MAIN [facility] SERVICES [address] [place], [place], [address] Phone: [phone]   Fax:  [phone]  Occupational Therapy Treatment  Patient Details  Name: Jessica [name] MRN: [id number] Date of Birth: 1951-06-07 Referring Provider (OT): Dr. Lynne Leader (ortho)   Encounter Date: 10/05/2021   OT End of Session - 10/06/21 4081     Visit Number 2    Number of Visits 12    Date for OT Re-Evaluation 11/13/21    OT Start Time 0915    OT Stop Time 1000    OT Time Calculation (min) 45 min    Activity Tolerance Patient tolerated treatment well    Behavior During Therapy [facility] for tasks assessed/performed             Past Medical History:  Diagnosis Date   Allergy    Breast cancer ([place]) 2000   Cancer ([place])    breast   Colon polyp    Coronary artery disease    GERD (gastroesophageal reflux disease)    History of blood transfusion    Hyperlipidemia    Migraine    history of migrarines, none as an adult   Personal history of radiation therapy 2000    Past Surgical History:  Procedure Laterality Date   ABDOMINAL HYSTERECTOMY     BREAST LUMPECTOMY Right 2000   BREAST SURGERY Right    calcifaction removed   BUNIONECTOMY Bilateral    COLONOSCOPY W/ POLYPECTOMY     CORONARY ARTERY BYPASS GRAFT N/A 01/05/2019   Procedure: CORONARY ARTERY BYPASS GRAFTING (CABG), using right leg saphenous endoscopic and open vein harvest, exploration left leg;  Surgeon: Gaye Pollack, MD;  Location: [place];  Service: Open Heart Surgery;  Laterality: N/A;   EYE SURGERY Bilateral    cataract   hemorrhoid     LEFT HEART CATH AND CORONARY ANGIOGRAPHY N/A 01/01/2019   Procedure: LEFT HEART CATH AND CORONARY ANGIOGRAPHY;  Surgeon: Jettie Booze, MD;  Location: [place] CV LAB;  Service: Cardiovascular;  Laterality: N/A;   STERNAL WIRES REMOVAL N/A 05/11/2019   Procedure: STERNAL WIRES REMOVAL;  Surgeon: Gaye Pollack, MD;  [facility];  Service: Thoracic;  Laterality: N/A;   TEE WITHOUT CARDIOVERSION N/A 01/05/2019   Procedure: TRANSESOPHAGEAL ECHOCARDIOGRAM (TEE);  Surgeon: Gaye Pollack, MD;  Location: [place];  Service: Open Heart Surgery;  Laterality: N/A;    There were no vitals filed for this visit.   Subjective Assessment - 10/05/21 0809     Subjective  "My hands are a little sore today.  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  New labs indicate likeliness of RA.    Patient Stated Goals "I want to be able to use my hands without injuring them more."    Currently in Pain? Yes    Pain Score 3  Pain Location Hand    Pain Orientation Right;Left    Pain Descriptors / Indicators Aching    Pain Type Chronic pain    Pain Onset More than a month ago    Pain Frequency Intermittent    Aggravating Factors  gripping activity    Effect of Pain on Daily Activities pain with gripping    Multiple Pain Sites No           Occupational Therapy Treatment: Therapeutic Exercise: Moist heat applied to R/L hands x 5 min for pain reduction/muscle relaxation in prep for therapeutic exercise.  Issued pink theraputty and instructed pt in gripping, pinching, digit abd/add, and digging coins out of putty.  Encouraged use for both hands daily, gentle gripping/pinching to tolerance, and limit to <10 min 1-2x daily.  Instructed pt in and completed tendon glides for bilat hands x3 reps each with fair return demo.  Provided handouts for putty and tendon  glides.    Response to Treatment: Reinforced joint protection strategies with gym equipment, as pt reports she may try to get back to working out.  Encouraged pt limit dumbbell use at the gym to avoid over gripping, and use weight machines to allow loose grip during exercise.  Pt tolerated heat, therapeutic exercise with putty and tendon glides well today.  Pt will continue to benefit from skilled OT for HEP development, further education for joint protection techniques, and pain management for bilat hands.        OT Education - 10/05/21 [phone]     Education Details Joint protection strategies with gym/workout equipment, HEP instruction with theraputty exercises    Person(s) Educated Patient    Methods Explanation;Verbal cues;Demonstration;Handout    Comprehension Verbal cues required;Verbalized understanding;Need further instruction;Returned demonstration              OT Short Term Goals - 10/04/21 1006       OT SHORT TERM GOAL #1   Title Pt to be indep with splinting schedule to decrease pain in bilat hands.    Baseline Eval: pt reports she has a number of splints that she inconsistently uses    Time 3    Period Weeks    Status New    Target Date 10/23/21      OT SHORT TERM GOAL #2   Title Pt will be indep with HEP for ROM and strengthening bilat hands.    Baseline Eval: not yet initiated.    Time 3    Period Weeks    Status New    Target Date 10/23/21               OT Long Term Goals - 10/04/21 1009       OT LONG TERM GOAL #1   Title Pt will be indep with joint protection strategies in order to better tolerate ADL/IADL/leisure activities.    Baseline Eval: initiated education at eval, further training needed    Time 6    Period Weeks    Status New    Target Date 11/13/21      OT LONG TERM GOAL #2   Title Pt will tolerate modalities to reduce pain in bilat hands to <5/10 pain with ADL/IADL/leisure tasks.    Baseline Eval: pt reports 10/10 pain in bilat hands  with activity yesterday, 0 at rest today.    Time 6    Period Weeks    Status New    Target Date 11/13/21      OT LONG TERM  GOAL #3   Title Pt will increase FOTO score by 1-2 points to indicate increased functional performance.    Baseline Eval: FOTO 93    Time 6    Period Weeks    Status New    Target Date 11/13/21              Plan - 10/05/21 0949     Clinical Impression Statement Reinforced joint protection strategies with gym equipment, as pt reports she may try to get back to working out.  Encouraged pt limit dumbbell use at the gym to avoid over gripping, and use weight machines to allow loose grip during exercise.  Pt tolerated heat, therapeutic exercise with putty and tendon glides well today.  Pt will continue to benefit from skilled OT for HEP development, further education for joint protection techniques, and pain management for bilat hands.    OT Occupational Profile and History Problem Focused Assessment - Including review of records relating to presenting problem    Occupational performance deficits (Please refer to evaluation for details): ADL's;IADL's;Leisure;Social Participation    Body Structure / Function / Physical Skills ADL;Edema;Flexibility;ROM;UE functional use;Pain;Strength;IADL;Decreased knowledge of precautions;Body mechanics;Decreased knowledge of use of DME;Dexterity;FMC    Rehab Potential Good    Clinical Decision Making Limited treatment options, no task modification necessary    Comorbidities Affecting Occupational Performance: May have comorbidities impacting occupational performance    Modification or Assistance to Complete Evaluation  No modification of tasks or assist necessary to complete eval    OT Frequency 2x / week    OT Duration 6 weeks    OT Treatment/Interventions Self-care/ADL training;Ultrasound;Paraffin;Fluidtherapy;Contrast Bath;DME and/or AE instruction;Manual Therapy;Splinting;Patient/family education;Therapeutic  exercise;Iontophoresis;Moist Heat;Passive range of motion;Therapeutic activities    Plan Will plan OT 4-6 weeks    OT Home Exercise Plan not yet initiated; reviewed joint protection strategies    Consulted and Agree with Plan of Care Patient             Patient will benefit from skilled therapeutic intervention in order to improve the following deficits and impairments:   Body Structure / Function / Physical Skills: ADL, Edema, Flexibility, ROM, UE functional use, Pain, Strength, IADL, Decreased knowledge of precautions, Body mechanics, Decreased knowledge of use of DME, Dexterity, [facility]       Visit Diagnosis: Muscle weakness (generalized)  Other lack of coordination    Problem List Patient Active Problem List   Diagnosis Date Noted   Lymphadenopathy of head and neck 06/16/2021   ANA positive 05/31/2021   Long-term use of immunosuppressant medication 05/31/2021   Eye drainage 03/17/2021   Fatigue 01/27/2021   Chronic pain of both shoulders 06/01/2020   Swelling of both hands 06/01/2020   Cervical disc disorder with radiculopathy of cervical region 05/24/2020   Post inflammatory hypopigmentation 12/30/2019   Claudication in peripheral vascular disease ([place]) 12/22/2019   Arthritis of carpometacarpal (CMC) joint of left thumb 10/28/2019   Hand arthritis 10/01/2019   Statin myopathy 08/30/2019   SOB (shortness of breath) on exertion 06/19/2019   Hyperlipidemia 04/08/2019   S/P CABG x 5 01/05/2019   Left leg pain 09/30/2018   Degenerative joint disease of low back 01/09/2017   Routine general medical examination at a health care facility 10/02/2016   GERD (gastroesophageal reflux disease) 07/28/2016   H/O malignant neoplasm of breast 06/19/2016   Leta Speller, MS, OTR/L  Darleene Cleaver, OT 10/06/2021, 9:49 AM  [place] MAIN [facility] SERVICES [place],  [place], [address] Phone: [phone]   Fax:   [phone]  Name: Jessica [name] MRN: [id number] Date of Birth: September 03, 1951

## 2021-10-09 ENCOUNTER — Other Ambulatory Visit: Payer: Self-pay

## 2021-10-09 ENCOUNTER — Ambulatory Visit: Payer: Medicare PPO

## 2021-10-09 DIAGNOSIS — R278 Other lack of coordination: Secondary | ICD-10-CM | POA: Diagnosis not present

## 2021-10-09 DIAGNOSIS — M6281 Muscle weakness (generalized): Secondary | ICD-10-CM

## 2021-10-09 DIAGNOSIS — M79645 Pain in left finger(s): Secondary | ICD-10-CM | POA: Diagnosis not present

## 2021-10-09 DIAGNOSIS — M25641 Stiffness of right hand, not elsewhere classified: Secondary | ICD-10-CM

## 2021-10-09 NOTE — Therapy (Signed)
[place] MAIN [facility] SERVICES [address] [place], [place], [address] Phone: [phone]   Fax:  [phone]  Occupational Therapy Treatment  Patient Details  Name: Jessica [name] MRN: [id number] Date of Birth: 01/30/1951 Referring Provider (OT): Dr. Lynne Leader (ortho)   Encounter Date: 10/09/2021   OT End of Session - 10/09/21 1000     Visit Number 3    Number of Visits 12    Date for OT Re-Evaluation 11/13/21    OT Start Time 0845    OT Stop Time 0930    OT Time Calculation (min) 45 min    Activity Tolerance Patient tolerated treatment well    Behavior During Therapy [facility] for tasks assessed/performed             Past Medical History:  Diagnosis Date   Allergy    Breast cancer ([place]) 2000   Cancer ([place])    breast   Colon polyp    Coronary artery disease    GERD (gastroesophageal reflux disease)    History of blood transfusion    Hyperlipidemia    Migraine    history of migrarines, none as an adult   Personal history of radiation therapy 2000    Past Surgical History:  Procedure Laterality Date   ABDOMINAL HYSTERECTOMY     BREAST LUMPECTOMY Right 2000   BREAST SURGERY Right    calcifaction removed   BUNIONECTOMY Bilateral    COLONOSCOPY W/ POLYPECTOMY     CORONARY ARTERY BYPASS GRAFT N/A 01/05/2019   Procedure: CORONARY ARTERY BYPASS GRAFTING (CABG), using right leg saphenous endoscopic and open vein harvest, exploration left leg;  Surgeon: Gaye Pollack, MD;  Location: [place];  Service: Open Heart Surgery;  Laterality: N/A;   EYE SURGERY Bilateral    cataract   hemorrhoid     LEFT HEART CATH AND CORONARY ANGIOGRAPHY N/A 01/01/2019   Procedure: LEFT HEART CATH AND CORONARY ANGIOGRAPHY;  Surgeon: Jettie Booze, MD;  Location: [place] CV LAB;  Service: Cardiovascular;  Laterality: N/A;   STERNAL WIRES REMOVAL N/A 05/11/2019   Procedure: STERNAL WIRES REMOVAL;  Surgeon: Gaye Pollack, MD;  [facility];  Service: Thoracic;  Laterality: N/A;   TEE WITHOUT CARDIOVERSION N/A 01/05/2019   Procedure: TRANSESOPHAGEAL ECHOCARDIOGRAM (TEE);  Surgeon: Gaye Pollack, MD;  Location: [place];  Service: Open Heart Surgery;  Laterality: N/A;    There were no vitals filed for this visit.   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  New labs indicate likeliness of RA.    Patient Stated Goals "I want to be able to use my hands without injuring them more."    Currently in Pain? No/denies   1/10  pain in hands over the weekend, none today.   Pain Onset More than a month ago            Occupational Therapy Treatment: Ultrasound: 8 min Performed pulsed [place] 4 min to R IF, LF, and RF MP joints, and 4 min to L thumb CMC and IP joint, 3.3 MHZ, 20%, 1.0 w/cm2 for pain management/inflammation reduction.  Therapeutic Exercise: 27 min Reviewed/completed tendon glides x5 each hand with min vc for technique.  Performed AROM bilat wrist flex/ext and radial/ulnar deviation, digit opposition and digit abd/add x2 sets 10 reps each.  Facilitated lateral and 3 point pinch with green, blue, and black clothespins x1 trial each hand for each pinch type, min vc for technique.   Self Care: 10 min Trialed various built up pen grips and ergonomic pain.  Pt felt the most comfort with built up red foam piece around pen and provided foam  piece for pt to take home.  Reinforced gripping pen with least amount of force necessary and instructed in alternative prehension patterns for joint pain relief.  Reinforced use of built up tools/handles for lawn care activities.  Pt plans to go to [employer] to inquire about built up handles for rakes and other tools.  Pt did obtain a jar opener since last sesion.    Response to Treatment: Pt reports using putty for 30 min each day.  OT enocuraged 10-15 min per session only to avoid aggravating joints in hands with overuse.  Heat applied to bilat hands intermittently during rest breaks and transitions during therapy session with good tolerance.  Pt reported minimal pain over the weekend in bilat hands at 1/10, no pain today.  Pt receptive to all joint protection strategies and demos good ability to perform all exercises with min vc for technique.  Pt has obtained an ergonomic jar opener, plans to visit [employer] to inquire about built up handles for yard tools, and was issued build up pen grip this day for writing activities at home.  Pt will continue to benefit from skilled OT for modalities to reduce pain and inflammation in hands, progress HEP, and reinforce joint protection strategies with daily activities.      OT Education - 10/09/21 0959     Education Details HEP progression    Person(s) Educated Patient    Methods Explanation;Verbal cues;Demonstration;Handout    Comprehension Verbal cues required;Verbalized understanding;Need further instruction;Returned demonstration              OT Short Term Goals - 10/04/21 1006       OT SHORT TERM GOAL #1   Title Pt to be indep with splinting schedule to decrease pain in bilat hands.    Baseline Eval: pt reports she has a number of splints that she inconsistently uses    Time 3    Period Weeks    Status New    Target Date 10/23/21      OT SHORT TERM GOAL #2   Title Pt will be indep with HEP for ROM and strengthening bilat hands.    Baseline Eval:  not yet initiated.    Time 3    Period Weeks    Status New    Target Date 10/23/21               OT Long Term Goals - 10/04/21 1009       OT LONG TERM GOAL #1   Title Pt will be indep with joint protection strategies in order to  better tolerate ADL/IADL/leisure activities.    Baseline Eval: initiated education at eval, further training needed    Time 6    Period Weeks    Status New    Target Date 11/13/21      OT LONG TERM GOAL #2   Title Pt will tolerate modalities to reduce pain in bilat hands to <5/10 pain with ADL/IADL/leisure tasks.    Baseline Eval: pt reports 10/10 pain in bilat hands with activity yesterday, 0 at rest today.    Time 6    Period Weeks    Status New    Target Date 11/13/21      OT LONG TERM GOAL #3   Title Pt will increase FOTO score by 1-2 points to indicate increased functional performance.    Baseline Eval: FOTO 93    Time 6    Period Weeks    Status New    Target Date 11/13/21               Plan - 10/09/21 1140     Clinical Impression Statement Pt reports using putty for 30 min each day.  OT enocuraged 10-15 min per session only to avoid aggravating joints in hands with overuse.  Heat applied to bilat hands intermittently during rest breaks and transitions during therapy session with good tolerance.  Pt reported minimal pain over the weekend in bilat hands at 1/10, no pain today.  Pt receptive to all joint protection strategies and demos good ability to perform all exercises with min vc for technique.  Pt has obtained an ergonomic jar opener, plans to visit [employer] to inquire about built up handles for yard tools, and was issued build up pen grip this day for writing activities at home.  Pt will continue to benefit from skilled OT for modalities to reduce pain and inflammation in hands, progress HEP, and reinforce joint protection strategies with daily activities.    OT Occupational Profile and History Problem Focused Assessment - Including  review of records relating to presenting problem    Occupational performance deficits (Please refer to evaluation for details): ADL's;IADL's;Leisure;Social Participation    Body Structure / Function / Physical Skills ADL;Edema;Flexibility;ROM;UE functional use;Pain;Strength;IADL;Decreased knowledge of precautions;Body mechanics;Decreased knowledge of use of DME;Dexterity;FMC    Rehab Potential Good    Clinical Decision Making Limited treatment options, no task modification necessary    Comorbidities Affecting Occupational Performance: May have comorbidities impacting occupational performance    Modification or Assistance to Complete Evaluation  No modification of tasks or assist necessary to complete eval    OT Frequency 2x / week    OT Duration 6 weeks    OT Treatment/Interventions Self-care/ADL training;Ultrasound;Paraffin;Fluidtherapy;Contrast Bath;DME and/or AE instruction;Manual Therapy;Splinting;Patient/family education;Therapeutic exercise;Iontophoresis;Moist Heat;Passive range of motion;Therapeutic activities    Plan Will plan OT 4-6 weeks    OT Home Exercise Plan not yet initiated; reviewed joint protection strategies    Consulted and Agree with Plan of Care Patient             Patient will benefit from skilled therapeutic intervention in order to improve the following deficits and impairments:   Body Structure / Function / Physical Skills: ADL, Edema, Flexibility, ROM, UE functional use, Pain, Strength, IADL, Decreased knowledge of precautions, Body mechanics, Decreased knowledge of use of DME, Dexterity, [facility]       Visit Diagnosis: Pain of left thumb  Stiffness of right hand, not elsewhere classified  Muscle weakness (generalized)    Problem List Patient Active Problem List  Diagnosis Date Noted   Lymphadenopathy of head and neck 06/16/2021   ANA positive 05/31/2021   Long-term use of immunosuppressant medication 05/31/2021   Eye drainage 03/17/2021   Fatigue  01/27/2021   Chronic pain of both shoulders 06/01/2020   Swelling of both hands 06/01/2020   Cervical disc disorder with radiculopathy of cervical region 05/24/2020   Post inflammatory hypopigmentation 12/30/2019   Claudication in peripheral vascular disease ([place]) 12/22/2019   Arthritis of carpometacarpal (CMC) joint of left thumb 10/28/2019   Hand arthritis 10/01/2019   Statin myopathy 08/30/2019   SOB (shortness of breath) on exertion 06/19/2019   Hyperlipidemia 04/08/2019   S/P CABG x 5 01/05/2019   Left leg pain 09/30/2018   Degenerative joint disease of low back 01/09/2017   Routine general medical examination at a health care facility 10/02/2016   GERD (gastroesophageal reflux disease) 07/28/2016   H/O malignant neoplasm of breast 06/19/2016   Leta Speller, MS, OTR/L  Darleene Cleaver, OT 10/09/2021, 11:41 AM  [place] [address] [place], [place], [address] Phone: [phone]   Fax:  [phone]  Name: Jessica [name] MRN: [id number] Date of Birth: [birth year]

## 2021-10-11 ENCOUNTER — Ambulatory Visit: Payer: Medicare PPO

## 2021-10-11 ENCOUNTER — Other Ambulatory Visit: Payer: Self-pay

## 2021-10-11 DIAGNOSIS — M25641 Stiffness of right hand, not elsewhere classified: Secondary | ICD-10-CM

## 2021-10-11 DIAGNOSIS — M79645 Pain in left finger(s): Secondary | ICD-10-CM

## 2021-10-11 DIAGNOSIS — R278 Other lack of coordination: Secondary | ICD-10-CM | POA: Diagnosis not present

## 2021-10-11 DIAGNOSIS — M6281 Muscle weakness (generalized): Secondary | ICD-10-CM | POA: Diagnosis not present

## 2021-10-11 NOTE — Therapy (Signed)
[place] MAIN [facility] SERVICES [address] [place], [place], [address] Phone: [phone]   Fax:  [phone]  Occupational Therapy Treatment  Patient Details  Name: Jessica [name] MRN: [id number] Date of Birth: 1951-03-27 Referring Provider (OT): Dr. Lynne Leader (ortho)   Encounter Date: 10/11/2021   OT End of Session - 10/11/21 1624     Visit Number 4    Number of Visits 12    Date for OT Re-Evaluation 11/13/21    OT Start Time 0930    OT Stop Time 1015    OT Time Calculation (min) 45 min    Activity Tolerance Patient tolerated treatment well    Behavior During Therapy [facility] for tasks assessed/performed             Past Medical History:  Diagnosis Date   Allergy    Breast cancer ([place]) 2000   Cancer ([place])    breast   Colon polyp    Coronary artery disease    GERD (gastroesophageal reflux disease)    History of blood transfusion    Hyperlipidemia    Migraine    history of migrarines, none as an adult   Personal history of radiation therapy 2000    Past Surgical History:  Procedure Laterality Date   ABDOMINAL HYSTERECTOMY     BREAST LUMPECTOMY Right 2000   BREAST SURGERY Right    calcifaction removed   BUNIONECTOMY Bilateral    COLONOSCOPY W/ POLYPECTOMY     CORONARY ARTERY BYPASS GRAFT N/A 01/05/2019   Procedure: CORONARY ARTERY BYPASS GRAFTING (CABG), using right leg saphenous endoscopic and open vein harvest, exploration left leg;  Surgeon: Gaye Pollack, MD;  Location: [place];  Service: Open Heart Surgery;  Laterality: N/A;   EYE SURGERY Bilateral    cataract   hemorrhoid     LEFT HEART CATH AND CORONARY ANGIOGRAPHY N/A 01/01/2019   Procedure: LEFT HEART CATH AND CORONARY ANGIOGRAPHY;  Surgeon: Jettie Booze, MD;  Location: [place] CV LAB;  Service: Cardiovascular;  Laterality: N/A;   STERNAL WIRES REMOVAL N/A 05/11/2019   Procedure: STERNAL WIRES REMOVAL;  Surgeon: Gaye Pollack, MD;  [facility];  Service: Thoracic;  Laterality: N/A;   TEE WITHOUT CARDIOVERSION N/A 01/05/2019   Procedure: TRANSESOPHAGEAL ECHOCARDIOGRAM (TEE);  Surgeon: Gaye Pollack, MD;  Location: [place];  Service: Open Heart Surgery;  Laterality: N/A;    There were no vitals filed for this visit.   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  New labs indicate likeliness of RA.    Patient Stated Goals "I want to be able to use my hands without injuring them more."    Pain Onset More than a month ago  Occupational Therapy Treatment: Moist heat applied to R/L hands x 5 min for pain reduction/muscle relaxation in prep for therapeutic exercise.  Ultrasound: 8 min Performed pulsed [place] 4 min to R IF, LF, and RF MP joints, and 4 min to L thumb CMC and IP joint, 3.3 MHZ, 20%, 1.0 w/cm2 for pain management/inflammation reduction.   Therapeutic Exercise: 15 min Completed tendon glides x3 each hand with min vc for technique.  Performed AROM bilat wrist flex/ext and radial/ulnar deviation, 10 reps each.  OT reinforced importance of daily wrist and digit ROM to maintain joint mobility with RA.  Facilitated lateral and 3 point pinch with red, green, and blue, clothespins, requiring vc for positioning of thumb to prevent excess stress on CMC joint, cueing pt to flex IP joint into pins with good return demo.  Manual Therapy: Manual massage over  swollen L thumb CMC, IP and R hand IF/LF/RF MCP joints, bilat thenar eminence and radial wrists, working to reduce pain, inflammation, and joint stiffness.   Response to Treatment: See Plan/clinical impression below.    OT Education - 10/11/21 1624     Education Details joint protection strategies with IADLs    Person(s) Educated Patient    Methods Explanation;Verbal cues;Demonstration;Handout    Comprehension Verbal cues required;Verbalized understanding;Need further instruction;Returned demonstration              OT Short Term Goals - 10/04/21 1006       OT SHORT TERM GOAL #1   Title Pt to be indep with splinting schedule to decrease pain in bilat hands.    Baseline Eval: pt reports she has a number of splints that she inconsistently uses    Time 3    Period Weeks    Status New    Target Date 10/23/21      OT SHORT TERM GOAL #2   Title Pt will be indep with HEP for ROM and strengthening bilat hands.    Baseline Eval: not yet initiated.    Time 3    Period Weeks    Status New    Target Date 10/23/21               OT Long Term Goals - 10/04/21 1009       OT LONG TERM GOAL #1   Title Pt will be indep with joint protection strategies in order to better tolerate ADL/IADL/leisure activities.    Baseline Eval: initiated education at eval, further training needed    Time 6    Period Weeks    Status New    Target Date 11/13/21      OT LONG TERM GOAL #2   Title Pt will tolerate modalities to reduce pain in bilat hands to <5/10 pain with ADL/IADL/leisure tasks.    Baseline Eval: pt reports 10/10 pain in bilat hands with activity yesterday, 0 at rest today.    Time 6    Period Weeks    Status New    Target Date 11/13/21      OT LONG TERM GOAL #3   Title Pt will increase FOTO score by 1-2 points to indicate increased functional performance.    Baseline Eval: FOTO 93    Time 6    Period Weeks    Status New    Target Date 11/13/21               Plan -  10/11/21 1643     Clinical Impression Statement Pt continues to be receptive to  joint protection strategies.  OT reinforced benefits of carrying reusable grocery shoulder bags instead of carrying in multiple plastic grocery hand held bags, and encouraged pushing a cart vs carrying a basket.  Pt responding well to heat, [place], and therapeutic exercise and massage.  Pt reported no pain today and minimal pain in hands yesterday.  Pt will continue to benefit from skilled OT for modalities to target pain and inflammation in hands, reinforce joint protection strategies with daily tasks, and progress HEP as tolerated.    OT Occupational Profile and History Problem Focused Assessment - Including review of records relating to presenting problem    Occupational performance deficits (Please refer to evaluation for details): ADL's;IADL's;Leisure;Social Participation    Body Structure / Function / Physical Skills ADL;Edema;Flexibility;ROM;UE functional use;Pain;Strength;IADL;Decreased knowledge of precautions;Body mechanics;Decreased knowledge of use of DME;Dexterity;FMC    Rehab Potential Good    Clinical Decision Making Limited treatment options, no task modification necessary    Comorbidities Affecting Occupational Performance: May have comorbidities impacting occupational performance    Modification or Assistance to Complete Evaluation  No modification of tasks or assist necessary to complete eval    OT Frequency 2x / week    OT Duration 6 weeks    OT Treatment/Interventions Self-care/ADL training;Ultrasound;Paraffin;Fluidtherapy;Contrast Bath;DME and/or AE instruction;Manual Therapy;Splinting;Patient/family education;Therapeutic exercise;Iontophoresis;Moist Heat;Passive range of motion;Therapeutic activities    Plan Will plan OT 4-6 weeks    OT Home Exercise Plan not yet initiated; reviewed joint protection strategies    Consulted and Agree with Plan of Care Patient             Patient will benefit from  skilled therapeutic intervention in order to improve the following deficits and impairments:   Body Structure / Function / Physical Skills: ADL, Edema, Flexibility, ROM, UE functional use, Pain, Strength, IADL, Decreased knowledge of precautions, Body mechanics, Decreased knowledge of use of DME, Dexterity, [facility]       Visit Diagnosis: Muscle weakness (generalized)  Pain of left thumb  Stiffness of right hand, not elsewhere classified    Problem List Patient Active Problem List   Diagnosis Date Noted   Lymphadenopathy of head and neck 06/16/2021   ANA positive 05/31/2021   Long-term use of immunosuppressant medication 05/31/2021   Eye drainage 03/17/2021   Fatigue 01/27/2021   Chronic pain of both shoulders 06/01/2020   Swelling of both hands 06/01/2020   Cervical disc disorder with radiculopathy of cervical region 05/24/2020   Post inflammatory hypopigmentation 12/30/2019   Claudication in peripheral vascular disease ([place]) 12/22/2019   Arthritis of carpometacarpal (CMC) joint of left thumb 10/28/2019   Hand arthritis 10/01/2019   Statin myopathy 08/30/2019   SOB (shortness of breath) on exertion 06/19/2019   Hyperlipidemia 04/08/2019   S/P CABG x 5 01/05/2019   Left leg pain 09/30/2018   Degenerative joint disease of low back 01/09/2017   Routine general medical examination at a health care facility 10/02/2016   GERD (gastroesophageal reflux disease) 07/28/2016   H/O malignant neoplasm of breast 06/19/2016   Leta Speller, MS, OTR/L  Darleene Cleaver, OT 10/11/2021, 4:43 PM  [place] MAIN [facility] SERVICES [address] [place], [place], [address] Phone: [phone]   Fax:  [phone]  Name: Jessica [name] MRN: [id number] Date of Birth: 11-09-1950

## 2021-10-17 ENCOUNTER — Telehealth: Payer: Self-pay | Admitting: Internal Medicine

## 2021-10-17 NOTE — Telephone Encounter (Signed)
LVM for pt to rtn my call to schedule AWV with [facility]. Please schedule if pt calls the office.  ?

## 2021-10-18 ENCOUNTER — Other Ambulatory Visit: Payer: Self-pay | Admitting: Internal Medicine

## 2021-10-18 ENCOUNTER — Ambulatory Visit: Payer: Medicare PPO

## 2021-10-18 ENCOUNTER — Other Ambulatory Visit
Admission: RE | Admit: 2021-10-18 | Discharge: 2021-10-18 | Disposition: A | Discharge status: Home / Self Care | Payer: Medicare PPO | Source: Ambulatory Visit | Attending: Internal Medicine | Admitting: Internal Medicine

## 2021-10-18 ENCOUNTER — Other Ambulatory Visit: Payer: Self-pay

## 2021-10-18 ENCOUNTER — Ambulatory Visit
Admission: RE | Admit: 2021-10-18 | Discharge: 2021-10-18 | Disposition: A | Discharge status: Home / Self Care | Payer: Medicare PPO | Source: Ambulatory Visit | Attending: Internal Medicine | Admitting: Internal Medicine

## 2021-10-18 DIAGNOSIS — M79604 Pain in right leg: Secondary | ICD-10-CM | POA: Diagnosis not present

## 2021-10-18 DIAGNOSIS — M7989 Other specified soft tissue disorders: Secondary | ICD-10-CM | POA: Insufficient documentation

## 2021-10-18 DIAGNOSIS — M79661 Pain in right lower leg: Secondary | ICD-10-CM | POA: Diagnosis not present

## 2021-10-18 LAB — D-DIMER, QUANTITATIVE: D-Dimer, Quant: 3.48 ug/mL-FEU — ABNORMAL HIGH (ref 0.00–0.50)

## 2021-10-19 ENCOUNTER — Telehealth: Payer: Self-pay | Admitting: Cardiovascular Disease

## 2021-10-19 NOTE — Telephone Encounter (Signed)
Went and spoke with pt in [place] no orders for labs or lipids, apologize that we are not a walk-in clinic for labs Pt reports [facility] told her she could come at anytime for blood work Suggest while she is here, could call PCP to place order for labs/lipids at [facility] as Dr. Rockey Situ is not here to advise on lab orders. Jessica [name] verbalized understanding and thankful for advice.

## 2021-10-19 NOTE — Telephone Encounter (Signed)
Patient in lobby Wants to know if she can have labs drawn to check her cholesterol today  Please advise

## 2021-10-20 ENCOUNTER — Ambulatory Visit: Payer: Medicare PPO

## 2021-10-22 DIAGNOSIS — M069 Rheumatoid arthritis, unspecified: Secondary | ICD-10-CM

## 2021-10-22 HISTORY — DX: Rheumatoid arthritis, unspecified: M06.9

## 2021-10-25 ENCOUNTER — Ambulatory Visit: Payer: Medicare PPO | Attending: Family Medicine

## 2021-10-25 ENCOUNTER — Other Ambulatory Visit: Payer: Self-pay

## 2021-10-25 DIAGNOSIS — M6281 Muscle weakness (generalized): Secondary | ICD-10-CM | POA: Insufficient documentation

## 2021-10-25 DIAGNOSIS — R278 Other lack of coordination: Secondary | ICD-10-CM | POA: Insufficient documentation

## 2021-10-26 NOTE — Therapy (Signed)
[place] MAIN [facility] SERVICES [address] [place], [place], [address] Phone: [phone]   Fax:  [phone]  Occupational Therapy Discharge Visit  Patient Details  Name: Jessica [name] MRN: [id number] Date of Birth: Nov 17, 1950 Referring Provider (OT): Dr. Lynne Leader (ortho)   Encounter Date: 10/25/2021   OT End of Session - 10/26/21 1202     Visit Number 5    Number of Visits 12    Date for OT Re-Evaluation 11/13/21    OT Start Time 1045    OT Stop Time 1130    OT Time Calculation (min) 45 min    Activity Tolerance Patient tolerated treatment well    Behavior During Therapy [facility] for tasks assessed/performed             Past Medical History:  Diagnosis Date   Allergy    Breast cancer ([place]) 2000   Cancer ([place])    breast   Colon polyp    Coronary artery disease    GERD (gastroesophageal reflux disease)    History of blood transfusion    Hyperlipidemia    Migraine    history of migrarines, none as an adult   Personal history of radiation therapy 2000    Past Surgical History:  Procedure Laterality Date   ABDOMINAL HYSTERECTOMY     BREAST LUMPECTOMY Right 2000   BREAST SURGERY Right    calcifaction removed   BUNIONECTOMY Bilateral    COLONOSCOPY W/ POLYPECTOMY     CORONARY ARTERY BYPASS GRAFT N/A 01/05/2019   Procedure: CORONARY ARTERY BYPASS GRAFTING (CABG), using right leg saphenous endoscopic and open vein harvest, exploration left leg;  Surgeon: Gaye Pollack, MD;  Location: [place];  Service: Open Heart Surgery;  Laterality: N/A;   EYE SURGERY Bilateral    cataract   hemorrhoid     LEFT HEART CATH AND CORONARY ANGIOGRAPHY N/A 01/01/2019   Procedure: LEFT HEART CATH AND CORONARY ANGIOGRAPHY;  Surgeon: Jettie Booze, MD;  Location: [place] CV LAB;  Service: Cardiovascular;  Laterality: N/A;   STERNAL WIRES REMOVAL N/A 05/11/2019   Procedure: STERNAL WIRES REMOVAL;  Surgeon: Gaye Pollack, MD;   [facility];  Service: Thoracic;  Laterality: N/A;   TEE WITHOUT CARDIOVERSION N/A 01/05/2019   Procedure: TRANSESOPHAGEAL ECHOCARDIOGRAM (TEE);  Surgeon: Gaye Pollack, MD;  Location: [place];  Service: Open Heart Surgery;  Laterality: N/A;    There were no vitals filed for this visit.   Subjective Assessment - 10/25/21 1053     Subjective  "My leg swelled up on Wednesday last week and I got checked for a blood clot but it was negative.  They put me on prednisone and it's feeling a lot better."    Pertinent History Pt has history of OA in bilateral thumb CMC - L worse than R , and R 3rd digit - pt had old ORIF of L 2nd digit - cannot flex PIP- seen Dr Fredna Dow in 08/21 , thumb splint fabricated for L thumb CMC - but pt report cannot wear it - restricting her - She had increase pain the last month - seen Dr Louanne Skye in [place] - got shots in bilateral 5th digit A1pulley - helped for pain - refer to OT; Pt returns today on 10/03/2021 with worsening L thumb CMC pain and R 2nd digit MCP pain.  New labs indicate likeliness of RA.    Patient Stated Goals "I want to be able to use my hands  without injuring them more."    Currently in Pain? No/denies    Pain Score 0-No pain    Pain Onset More than a month ago            Occupational Therapy Treatment/Discharge Visit: Ultrasound: (8 min) Performed pulsed [place] 4 min to R IF, LF, and RF MP joints, and 4 min to L thumb CMC and IP joint, 3.3 MHZ, 20%, 1.0 w/cm2 for pain management/inflammation reduction.  Self Care: (10 min) Reviewed joint protection strategies with pt able to recall 100% of technique and reports good utilization of strategies during daily tasks.  Pt has obtained jar opener, is aware of benefits of built up grips for yard tools, understands importance of using larger muscle groups to lift/carry ADL supplies, and understands the importance of frequent rest breaks while alternating strenuous tasks to avoid overworking joints.  Reviewed  pain management strategies for hands, including use of heat, compression gloves, rest, OT anti-inflammatories approved by MD, or other prescriptions offered by MD (pt planning to see Rheumatologist but does not yet have anything scheduled).    Therapeutic Exercise: (27 min) HEP reviewed to include AROM for bilat wrists and hands, tendon glides, putty exercises.  Measurements taken and goals reviewed/updated/met.    Response to Treatment: Pt has been seen by OT for 5 visits to address joint pain and muscle weakness in bilat hands, with MD suspecting RA.  At discharge, pt has increased FOTO score from 93 to 97.  Pt demos increased strength predominantly in L hand, which pt reports was the hand she was more concerned about, as well as consistent reduction in pain.  Pt tolerated [place], ROM, strengthening exercises, and stretching well during course of tx.  Pt is indep with HEP, pain management strategies, and joint protection strategies.  Pt is appropriate for OT d/c at this time with all goals met and no additional skilled OT indicated at this time.  Pt in agreement with plan.      OT Education - 10/25/21 1202     Education Details joint protection strategies with IADLs, HEP review, pain management strategies    Person(s) Educated Patient    Methods Explanation;Verbal cues    Comprehension Verbal cues required;Verbalized understanding;Returned demonstration              OT Short Term Goals - 10/25/21 1122       OT SHORT TERM GOAL #1   Title Pt to be indep with splinting schedule to decrease pain in bilat hands.    Baseline Eval: pt reports she has a number of splints that she inconsistently uses; 10/25/21 d/c: pt no longer requires splints when she implements joint protection strategies.    Time 3    Period Weeks    Status Achieved    Target Date 10/23/21      OT SHORT TERM GOAL #2   Title Pt will be indep with HEP for ROM and strengthening bilat hands.    Baseline Eval: not yet  initiated; 10/25/21 d/c: pt is indep with HEP.    Time 3    Period Weeks    Status Achieved    Target Date 10/23/21               OT Long Term Goals - 10/25/21 1124       OT LONG TERM GOAL #1   Title Pt will be indep with joint protection strategies in order to better tolerate ADL/IADL/leisure activities.    Baseline Eval: initiated  education at eval, further training needed; 10/25/21 d/c: Pt is indep with joint protection strategies    Time 6    Period Weeks    Status Achieved    Target Date 11/13/21      OT LONG TERM GOAL #2   Title Pt will tolerate modalities to reduce pain in bilat hands to <5/10 pain with ADL/IADL/leisure tasks.    Baseline Eval: pt reports 10/10 pain in bilat hands with activity yesterday, 0 at rest today; 10/25/21 d/c: pt reports 0/10 pain at rest, and no more than 5/10 with activity on certain days.    Time 6    Period Weeks    Status Achieved    Target Date 11/13/21      OT LONG TERM GOAL #3   Title Pt will increase FOTO score by 1-2 points to indicate increased functional performance.    Baseline Eval: FOTO 93; 10/25/21 d/c: FOTO 97    Time 6    Period Weeks    Status Achieved    Target Date 11/13/21              Plan - 10/25/21 1218     Clinical Impression Statement Pt has been seen by OT for 5 visits to address joint pain and muscle weakness in bilat hands, with MD suspecting RA.  At discharge, pt has increased FOTO score from 93 to 97.  Pt demos increased strength predominantly in L hand, which pt reports was the hand she was more concerned about, as well as consistent reduction in pain.  Pt tolerated [place], ROM, strengthening exercises, and stretching well during course of tx.  Pt is indep with HEP, pain management strategies, and joint protection strategies.  Pt is appropriate for OT d/c at this time with all goals met and no additional skilled OT indicated at this time.  Pt in agreement with plan.    OT Occupational Profile and History Problem  Focused Assessment - Including review of records relating to presenting problem    Occupational performance deficits (Please refer to evaluation for details): ADL's;IADL's;Leisure;Social Participation    Body Structure / Function / Physical Skills ADL;Edema;Flexibility;ROM;UE functional use;Pain;Strength;IADL;Decreased knowledge of precautions;Body mechanics;Decreased knowledge of use of DME;Dexterity;FMC    Rehab Potential Good    Clinical Decision Making Limited treatment options, no task modification necessary    Comorbidities Affecting Occupational Performance: May have comorbidities impacting occupational performance    Modification or Assistance to Complete Evaluation  No modification of tasks or assist necessary to complete eval    OT Frequency 2x / week    OT Duration 6 weeks    OT Treatment/Interventions Self-care/ADL training;Ultrasound;Paraffin;Fluidtherapy;Contrast Bath;DME and/or AE instruction;Manual Therapy;Splinting;Patient/family education;Therapeutic exercise;Iontophoresis;Moist Heat;Passive range of motion;Therapeutic activities    Plan Will plan OT 4-6 weeks    OT Home Exercise Plan not yet initiated; reviewed joint protection strategies    Consulted and Agree with Plan of Care Patient             Patient will benefit from skilled therapeutic intervention in order to improve the following deficits and impairments:   Body Structure / Function / Physical Skills: ADL, Edema, Flexibility, ROM, UE functional use, Pain, Strength, IADL, Decreased knowledge of precautions, Body mechanics, Decreased knowledge of use of DME, Dexterity, [facility]       Visit Diagnosis: Muscle weakness (generalized)  Other lack of coordination    Problem List Patient Active Problem List   Diagnosis Date Noted   Lymphadenopathy of head and neck 06/16/2021  ANA positive 05/31/2021   Long-term use of immunosuppressant medication 05/31/2021   Eye drainage 03/17/2021   Fatigue 01/27/2021    Chronic pain of both shoulders 06/01/2020   Swelling of both hands 06/01/2020   Cervical disc disorder with radiculopathy of cervical region 05/24/2020   Post inflammatory hypopigmentation 12/30/2019   Claudication in peripheral vascular disease ([place]) 12/22/2019   Arthritis of carpometacarpal (CMC) joint of left thumb 10/28/2019   Hand arthritis 10/01/2019   Statin myopathy 08/30/2019   SOB (shortness of breath) on exertion 06/19/2019   Hyperlipidemia 04/08/2019   S/P CABG x 5 01/05/2019   Left leg pain 09/30/2018   Degenerative joint disease of low back 01/09/2017   Routine general medical examination at a health care facility 10/02/2016   GERD (gastroesophageal reflux disease) 07/28/2016   H/O malignant neoplasm of breast 06/19/2016   Leta Speller, MS, OTR/L  Darleene Cleaver, OT 10/26/2021, 12:18 PM  [place] MAIN [facility] SERVICES [address] [place], [place], [address] Phone: [phone]   Fax:  [phone]  Name: Jessica [name] MRN: [id number] Date of Birth: 1951-01-21

## 2021-10-27 ENCOUNTER — Ambulatory Visit: Payer: Medicare PPO

## 2021-10-30 ENCOUNTER — Encounter: Payer: Self-pay | Admitting: Internal Medicine

## 2021-10-30 ENCOUNTER — Other Ambulatory Visit: Payer: Self-pay

## 2021-10-30 ENCOUNTER — Ambulatory Visit: Payer: Medicare PPO | Admitting: Internal Medicine

## 2021-10-30 VITALS — BP 124/62 | HR 69 | Resp 18 | Ht 66.0 in | Wt 182.2 lb

## 2021-10-30 DIAGNOSIS — G720 Drug-induced myopathy: Secondary | ICD-10-CM

## 2021-10-30 DIAGNOSIS — R5383 Other fatigue: Secondary | ICD-10-CM

## 2021-10-30 DIAGNOSIS — T466X5A Adverse effect of antihyperlipidemic and antiarteriosclerotic drugs, initial encounter: Secondary | ICD-10-CM | POA: Diagnosis not present

## 2021-10-30 DIAGNOSIS — E782 Mixed hyperlipidemia: Secondary | ICD-10-CM | POA: Diagnosis not present

## 2021-10-30 DIAGNOSIS — R1310 Dysphagia, unspecified: Secondary | ICD-10-CM | POA: Diagnosis not present

## 2021-10-30 DIAGNOSIS — R768 Other specified abnormal immunological findings in serum: Secondary | ICD-10-CM

## 2021-10-30 LAB — COMPREHENSIVE METABOLIC PANEL
ALT: 17 U/L (ref 0–35)
AST: 27 U/L (ref 0–37)
Albumin: 4.4 g/dL (ref 3.5–5.2)
Alkaline Phosphatase: 64 U/L (ref 39–117)
BUN: 14 mg/dL (ref 6–23)
CO2: 27 mEq/L (ref 19–32)
Calcium: 9.9 mg/dL (ref 8.4–10.5)
Chloride: 100 mEq/L (ref 96–112)
Creatinine, Ser: 1.11 mg/dL (ref 0.40–1.20)
GFR: 50.13 mL/min — ABNORMAL LOW (ref >60.00)
Glucose, Bld: 90 mg/dL (ref 70–99)
Potassium: 4.1 mEq/L (ref 3.5–5.1)
Sodium: 137 mEq/L (ref 135–145)
Total Bilirubin: 0.6 mg/dL (ref 0.2–1.2)
Total Protein: 8.1 g/dL (ref 6.0–8.3)

## 2021-10-30 LAB — CK: Total CK: 285 U/L — ABNORMAL HIGH (ref 7–177)

## 2021-10-30 LAB — CBC
HCT: 39.9 % (ref 36.0–46.0)
Hemoglobin: 13 g/dL (ref 12.0–15.0)
MCHC: 32.5 g/dL (ref 30.0–36.0)
MCV: 87.9 fl (ref 78.0–100.0)
Platelets: 308 10*3/uL (ref 150.0–400.0)
RBC: 4.54 Mil/uL (ref 3.87–5.11)
RDW: 13.5 % (ref 11.5–15.5)
WBC: 8.5 10*3/uL (ref 4.0–10.5)

## 2021-10-30 LAB — MAGNESIUM: Magnesium: 1.7 mg/dL (ref 1.5–2.5)

## 2021-10-30 LAB — LIPID PANEL
Cholesterol: 159 mg/dL (ref 0–200)
HDL: 31.4 mg/dL — ABNORMAL LOW (ref >39.00)
LDL Cholesterol: 100 mg/dL — ABNORMAL HIGH (ref 0–99)
NonHDL: 127.69
Total CHOL/HDL Ratio: 5
Triglycerides: 139 mg/dL (ref 0.0–149.0)
VLDL: 27.8 mg/dL (ref 0.0–40.0)

## 2021-10-30 LAB — VITAMIN D 25 HYDROXY (VIT D DEFICIENCY, FRACTURES): VITD: 49.9 ng/mL (ref 30.00–100.00)

## 2021-10-30 LAB — VITAMIN B12: Vitamin B-12: 651 pg/mL (ref 211–911)

## 2021-10-30 NOTE — Progress Notes (Signed)
° °  Subjective:   Patient ID: Jessica [name], female    DOB: 03-Mar-1951, 71 y.o.   MRN: [id number]  HPI The patient is a 71 YO female coming in for fatigue and muscle pains since starting levqio and does not want to continue with this.   Review of Systems  Constitutional:  Positive for fatigue.  HENT: Negative.    Eyes: Negative.   Respiratory:  Negative for cough, chest tightness and shortness of breath.   Cardiovascular:  Negative for chest pain, palpitations and leg swelling.  Gastrointestinal:  Negative for abdominal distention, abdominal pain, constipation, diarrhea, nausea and vomiting.  Musculoskeletal:  Positive for myalgias.  Skin: Negative.   Neurological: Negative.   Psychiatric/Behavioral: Negative.     Objective:  Physical Exam Constitutional:      Appearance: She is well-developed.  HENT:     Head: Normocephalic and atraumatic.  Cardiovascular:     Rate and Rhythm: Normal rate and regular rhythm.  Pulmonary:     Effort: Pulmonary effort is normal. No respiratory distress.     Breath sounds: Normal breath sounds. No wheezing or rales.  Abdominal:     General: Bowel sounds are normal. There is no distension.     Palpations: Abdomen is soft.     Tenderness: There is no abdominal tenderness. There is no rebound.  Musculoskeletal:        General: Tenderness present.     Cervical back: Normal range of motion.  Skin:    General: Skin is warm and dry.  Neurological:     Mental Status: She is alert and oriented to person, place, and time.     Coordination: Coordination normal.    Vitals:   10/30/21 1523  BP: 124/62  Pulse: 69  Resp: 18  SpO2: 100%  Weight: 182 lb 3.2 oz (82.6 kg)  Height: 5\' 6"  (1.676 m)    This visit occurred during the SARS-CoV-2 public health emergency.  Safety protocols were in place, including screening questions prior to the visit, additional usage of staff PPE, and extensive cleaning of exam room while observing appropriate  contact time as indicated for disinfecting solutions.   Assessment & Plan:

## 2021-10-30 NOTE — Patient Instructions (Addendum)
We will get you in with GI.   We will check the labs today.

## 2021-10-31 ENCOUNTER — Encounter: Payer: Self-pay | Admitting: Internal Medicine

## 2021-10-31 DIAGNOSIS — R1310 Dysphagia, unspecified: Secondary | ICD-10-CM | POA: Insufficient documentation

## 2021-10-31 NOTE — Assessment & Plan Note (Signed)
She has taken 1 dose levqio and now is having side effects she feels are related. It is unclear since she had positive ANA, RF and ccp with sports medicine if those could be impacted by the levqio. We are checking CK, lipid panel, CBC, CMP today.

## 2021-10-31 NOTE — Assessment & Plan Note (Signed)
Checking magnesium, cbc, cmp, B12, vitamin D to assess and treat as needed.

## 2021-10-31 NOTE — Assessment & Plan Note (Signed)
Checking CK and lipid panel.

## 2021-10-31 NOTE — Assessment & Plan Note (Signed)
It is unclear if this is related to her fatigue and muscle aches as she may have new diagnosis of RA. She did take levqio prior to those labs and I am unsure if those labs could have been impacted by that.

## 2021-10-31 NOTE — Assessment & Plan Note (Signed)
Sometimes food is getting stuck. Desires referral to GI. Offered rx for PPI 2-4 weeks but she wishes to resume some supplemental digestives she had been taking but stopped. She wants to give this 2-3 weeks and see if it helps.

## 2021-11-09 ENCOUNTER — Other Ambulatory Visit: Payer: Self-pay

## 2021-11-09 ENCOUNTER — Ambulatory Visit: Payer: Medicare PPO | Admitting: Dermatology

## 2021-11-09 DIAGNOSIS — L659 Nonscarring hair loss, unspecified: Secondary | ICD-10-CM | POA: Diagnosis not present

## 2021-11-09 DIAGNOSIS — L309 Dermatitis, unspecified: Secondary | ICD-10-CM

## 2021-11-09 DIAGNOSIS — L821 Other seborrheic keratosis: Secondary | ICD-10-CM | POA: Diagnosis not present

## 2021-11-09 MED ORDER — MOMETASONE FUROATE 0.1 % EX CREA: 1.0000 "application " | TOPICAL_CREAM | Freq: Every day | CUTANEOUS | 0 refills | Status: DC | PRN

## 2021-11-09 NOTE — Patient Instructions (Addendum)
Continue Dove soap and start Cerave cream    If You Need Anything After Your Visit  If you have any questions or concerns for your doctor, please call our main line at [phone] and press option 4 to reach your doctor's medical assistant. If no one answers, please leave a voicemail as directed and we will return your call as soon as possible. Messages left after 4 pm will be answered the following business day.   You may also send [place] a message via [place]. We typically respond to MyChart messages within 1-2 business days.  For prescription refills, please ask your pharmacy to contact our office. Our fax number is [phone].  If you have an urgent issue when the clinic is closed that cannot wait until the next business day, you can page your doctor at the number below.    Please note that while we do our best to be available for urgent issues outside of office hours, we are not available 24/7.   If you have an urgent issue and are unable to reach [place], you may choose to seek medical care at your doctor's office, retail clinic, urgent care center, or emergency room.  If you have a medical emergency, please immediately call 911 or go to the emergency department.  Pager Numbers  - Dr. Nehemiah Massed: [phone]  - Dr. Laurence Ferrari: [phone]  - Dr. Nicole Kindred: [phone]  In the event of inclement weather, please call our main line at [phone] for an update on the status of any delays or closures.  Dermatology Medication Tips: Please keep the boxes that topical medications come in in order to help keep track of the instructions about where and how to use these. Pharmacies typically print the medication instructions only on the boxes and not directly on the medication tubes.   If your medication is too expensive, please contact our office at [phone] option 4 or send [place] a message through [place].   We are unable to tell what your co-pay for medications will be in advance as this is  different depending on your insurance coverage. However, we may be able to find a substitute medication at lower cost or fill out paperwork to get insurance to cover a needed medication.   If a prior authorization is required to get your medication covered by your insurance company, please allow [place] 1-2 business days to complete this process.  Drug prices often vary depending on where the prescription is filled and some pharmacies may offer cheaper prices.  The website www.goodrx.com contains coupons for medications through different pharmacies. The prices here do not account for what the cost may be with help from insurance (it may be cheaper with your insurance), but the website can give you the price if you did not use any insurance.  - You can print the associated coupon and take it with your prescription to the pharmacy.  - You may also stop by our office during regular business hours and pick up a GoodRx coupon card.  - If you need your prescription sent electronically to a different pharmacy, notify our office through [facility] or by phone at [phone] option 4.     Si Usted Necesita Algo Despus de Su Visita  Tambin puede enviarnos un mensaje a travs de Pharmacist, community. Por lo general respondemos a los mensajes de MyChart en el transcurso de 1 a 2 das hbiles.  Para renovar recetas, por favor pida a su farmacia que se ponga en contacto con nuestra oficina.  Harland Dingwall de fax es [place] [phone].  Si tiene un asunto urgente cuando la clnica est cerrada y que no puede esperar hasta el siguiente da hbil, puede llamar/localizar a su doctor(a) al nmero que aparece a continuacin.   Por favor, tenga en cuenta que aunque hacemos todo lo posible para estar disponibles para asuntos urgentes fuera del horario de [place], no estamos disponibles las 24 horas del da, los 7 das de la [place].   Si tiene un problema urgente y no puede comunicarse con nosotros, puede optar por buscar  atencin mdica  en el consultorio de su doctor(a), en una clnica privada, en un centro de atencin urgente o en una sala de emergencias.  Si tiene Engineering geologist, por favor llame inmediatamente al 911 o vaya a la sala de emergencias.  Nmeros de bper  - Dr. Nehemiah Massed: [phone]  - Dra. Moye: [phone]  - Dra. Nicole Kindred: [phone]  En caso de inclemencias del [place], por favor llame a Johnsie Kindred principal al [phone] para una actualizacin sobre el [place] de cualquier retraso o cierre.  Consejos para la medicacin en dermatologa: Por favor, guarde las cajas en las que vienen los medicamentos de uso tpico para ayudarle a seguir las instrucciones sobre dnde y cmo usarlos. Las farmacias generalmente imprimen las instrucciones del medicamento slo en las cajas y no directamente en los tubos del [place].   Si su medicamento es muy caro, por favor, pngase en contacto con Zigmund Daniel llamando al [phone] y presione la opcin 4 o envenos un mensaje a travs de Pharmacist, community.   No podemos decirle cul ser su copago por los medicamentos por adelantado ya que esto es diferente dependiendo de la cobertura de su seguro. Sin embargo, es posible que podamos encontrar un medicamento sustituto a Electrical engineer un formulario para que el seguro cubra el medicamento que se considera necesario.   Si se requiere una autorizacin previa para que su compaa de seguros [place] su medicamento, por favor permtanos de 1 a 2 das hbiles para completar este proceso.  Los precios de los medicamentos varan con frecuencia dependiendo del Environmental consultant de dnde se surte la receta y alguna farmacias pueden ofrecer precios ms baratos.  El sitio web www.goodrx.com tiene cupones para medicamentos de Airline pilot. Los precios aqu no tienen en cuenta lo que podra costar con la ayuda del seguro (puede ser ms barato con su seguro), pero el sitio web puede darle el precio si no utiliz  Research scientist (physical sciences).  - Puede imprimir el cupn correspondiente y llevarlo con su receta a la farmacia.  - Tambin puede pasar por nuestra oficina durante el horario de atencin regular y Charity fundraiser una tarjeta de cupones de GoodRx.  - Si necesita que su receta se enve electrnicamente a una farmacia diferente, informe a nuestra oficina a travs de MyChart de [place] o por telfono llamando al [phone] y presione la opcin 4.

## 2021-11-09 NOTE — Progress Notes (Signed)
° °  New Patient Visit  Subjective  Jessica [name] is a 71 y.o. female who presents for the following: Other (Dry skin of face. Her face is dry and scaly when she wakes up in the morning. She made the appointment 2 months ago and condition was worse.//Bald spot of scalp - not itchy or scaly. Just looks like it is getting a little bigger.). The patient has spots, moles and lesions to be evaluated, some may be new or changing and the patient has concerns.  The following portions of the chart were reviewed this encounter and updated as appropriate:   Tobacco   Allergies   Meds   Problems   Med Hx   Surg Hx   Fam Hx      Review of Systems:  No other skin or systemic complaints except as noted in HPI or Assessment and Plan.  Objective  Well appearing patient in no apparent distress; mood and affect are within normal limits.  A focused examination was performed including face. Relevant physical exam findings are noted in the Assessment and Plan.  Face, ears Peeling of ears     Scalp       Assessment & Plan   Seborrheic Keratoses -face - Stuck-on, waxy, tan-brown papules and/or plaques  - Benign-appearing - Discussed benign etiology and prognosis. - Observe - Call for any changes  Dermatitis -consistent with contact dermatitis Face, ears  Discussed patch testing. May consider if condition flares again.  Start Mometasone cream qd  Continue Dove soap and start Cerave cream   Related Medications mometasone (ELOCON) 0.1 % cream Apply 1 application topically daily as needed (Rash).  Alopecia most consistent with androgenetic alopecia  Scalp Discussed low-dose minoxidil 1.25 mg daily for hair loss.  Discussed that this is very safe and is low dose.  The only side effect is rare edema of the ankles.  We would recommend discontinuing it if this occurs.   Nevertheless, because patient has had open heart surgery, before prescribing this, will get okay from her primary care  physician.  Return if symptoms worsen or fail to improve.  I, Ashok Cordia, CMA, am acting as scribe for Sarina Ser, MD . Documentation: I have reviewed the above documentation for accuracy and completeness, and I agree with the above.  Sarina Ser, MD

## 2021-11-14 ENCOUNTER — Encounter: Payer: Self-pay | Admitting: Dermatology

## 2021-11-15 ENCOUNTER — Ambulatory Visit
Admission: RE | Admit: 2021-11-15 | Discharge: 2021-11-15 | Disposition: A | Discharge status: Home / Self Care | Payer: Medicare PPO | Source: Ambulatory Visit | Attending: Internal Medicine | Admitting: Internal Medicine

## 2021-11-15 DIAGNOSIS — Z1231 Encounter for screening mammogram for malignant neoplasm of breast: Secondary | ICD-10-CM

## 2021-11-28 ENCOUNTER — Ambulatory Visit: Payer: Medicare PPO | Admitting: Family Medicine

## 2021-11-28 NOTE — Progress Notes (Deleted)
° °  I, [name], LAT, ATC, am serving as scribe for Dr. Lynne Leader.  Jessica [name] is a 71 y.o. female who presents to [place] at [facility] today for f/u of B arm pain.  She was last seen by Dr. Georgina Snell on 09/27/21 w/ c/o L thumb pain and prior to that on 01/16/21 for R arm pain.  She was referred to hand therapy and has completed 5 visits and been d/c.  She also had  rheumatology lab work-up and based on those results was referred to rheumatology.  Today, pt reports   Diagnostic testing: B hand XR- 09/27/21; B shoulder XR- 06/03/20  Pertinent review of systems: ***  Relevant historical information: ***   Exam:  There were no vitals taken for this visit. General: Well Developed, well nourished, and in no acute distress.   MSK: ***    Lab and Radiology Results No results found for this or any previous visit (from the past 72 hour(s)). No results found.     Assessment and Plan: 71 y.o. female with ***   PDMP not reviewed this encounter. No orders of the defined types were placed in this encounter.  No orders of the defined types were placed in this encounter.    Discussed warning signs or symptoms. Please see discharge instructions. Patient expresses understanding.   ***

## 2021-11-29 ENCOUNTER — Encounter: Payer: Self-pay | Admitting: Family Medicine

## 2021-11-29 ENCOUNTER — Other Ambulatory Visit: Payer: Self-pay

## 2021-11-29 ENCOUNTER — Ambulatory Visit: Payer: Medicare PPO | Admitting: Family Medicine

## 2021-11-29 ENCOUNTER — Encounter: Payer: Self-pay | Admitting: Emergency Medicine

## 2021-11-29 ENCOUNTER — Encounter (HOSPITAL_COMMUNITY): Payer: Medicare PPO

## 2021-11-29 ENCOUNTER — Emergency Department
Admission: EM | Admit: 2021-11-29 | Discharge: 2021-11-29 | Disposition: A | Discharge status: Home / Self Care | Payer: Medicare PPO | Attending: Emergency Medicine | Admitting: Emergency Medicine

## 2021-11-29 VITALS — BP 129/86 | HR 72 | Ht 66.0 in | Wt 182.1 lb

## 2021-11-29 DIAGNOSIS — M25522 Pain in left elbow: Secondary | ICD-10-CM | POA: Diagnosis not present

## 2021-11-29 DIAGNOSIS — M25532 Pain in left wrist: Secondary | ICD-10-CM | POA: Diagnosis not present

## 2021-11-29 DIAGNOSIS — M25521 Pain in right elbow: Secondary | ICD-10-CM | POA: Diagnosis not present

## 2021-11-29 DIAGNOSIS — M25561 Pain in right knee: Secondary | ICD-10-CM | POA: Insufficient documentation

## 2021-11-29 DIAGNOSIS — X501XXA Overexertion from prolonged static or awkward postures, initial encounter: Secondary | ICD-10-CM | POA: Diagnosis not present

## 2021-11-29 DIAGNOSIS — M05742 Rheumatoid arthritis with rheumatoid factor of left hand without organ or systems involvement: Secondary | ICD-10-CM | POA: Diagnosis not present

## 2021-11-29 DIAGNOSIS — M25531 Pain in right wrist: Secondary | ICD-10-CM | POA: Diagnosis not present

## 2021-11-29 DIAGNOSIS — Y9343 Activity, gymnastics: Secondary | ICD-10-CM | POA: Diagnosis not present

## 2021-11-29 DIAGNOSIS — Z853 Personal history of malignant neoplasm of breast: Secondary | ICD-10-CM | POA: Insufficient documentation

## 2021-11-29 DIAGNOSIS — M05741 Rheumatoid arthritis with rheumatoid factor of right hand without organ or systems involvement: Secondary | ICD-10-CM

## 2021-11-29 DIAGNOSIS — Y9239 Other specified sports and athletic area as the place of occurrence of the external cause: Secondary | ICD-10-CM | POA: Diagnosis not present

## 2021-11-29 DIAGNOSIS — Z951 Presence of aortocoronary bypass graft: Secondary | ICD-10-CM | POA: Diagnosis not present

## 2021-11-29 DIAGNOSIS — M79601 Pain in right arm: Secondary | ICD-10-CM | POA: Diagnosis not present

## 2021-11-29 DIAGNOSIS — M79605 Pain in left leg: Secondary | ICD-10-CM | POA: Insufficient documentation

## 2021-11-29 DIAGNOSIS — M79602 Pain in left arm: Secondary | ICD-10-CM | POA: Diagnosis not present

## 2021-11-29 DIAGNOSIS — I2510 Atherosclerotic heart disease of native coronary artery without angina pectoris: Secondary | ICD-10-CM | POA: Insufficient documentation

## 2021-11-29 DIAGNOSIS — M2550 Pain in unspecified joint: Secondary | ICD-10-CM

## 2021-11-29 DIAGNOSIS — M25562 Pain in left knee: Secondary | ICD-10-CM | POA: Insufficient documentation

## 2021-11-29 DIAGNOSIS — M79604 Pain in right leg: Secondary | ICD-10-CM | POA: Insufficient documentation

## 2021-11-29 MED ORDER — PREDNISONE 10 MG (48) PO TBPK: ORAL_TABLET | Freq: Every day | ORAL | 0 refills | Status: DC

## 2021-11-29 MED ORDER — LIDOCAINE 5 % EX PTCH
2.0000 | MEDICATED_PATCH | CUTANEOUS | Status: DC
  Filled 2021-11-29: qty 2

## 2021-11-29 NOTE — ED Triage Notes (Signed)
Pt comes into the ED via POV c/o bilateral arm and bilateral knee pain after working out in the gym.  Pt states that she denies any known injury.  Pt explains the pain is soreness in the muscle and joint pain.  Pt ambulatory to triage at this time with even and unlabored respirations.

## 2021-11-29 NOTE — Progress Notes (Signed)
I, [name], LAT, ATC, am serving as scribe for Dr. Lynne Leader.  Jessica [name] is a 71 y.o. female who presents to [place] at [facility] today for arm and knee pain.  She was last seen by Dr. Georgina Snell on 09/27/21 for L thumb pain and prior to that for R arm pain.  She had labs looking for a possible rheumatologic cause to her pain that were positive and has been referred to rheumatology.  She was also referred to hand therapy of which she has completed 5 sessions and been d/c.  She was seen at the [facility] ED this morning w/ wrosening pain in her B wrists, elbows and knees x 3-4 days and was advised to f/u w/ Dr. Georgina Snell.  She states that both upper arms, L thumb and B knees are most painful.  She states that she started working out again at the gym about 2 weeks ago and states that her pain increased over the past week.  Diagnostic testing: Rheumatologic labs - 09/27/21; R and L hand XR- 09/27/21; L foot XR- 08/07/21; L knee XR- 08/25/20; R and L shoulder XR- 06/03/20  Pertinent review of systems: No fevers or chills   Relevant historical information: After discussion and careful back-and-forth she revealed that she does have a history of rheumatoid arthritis and has been seen by rheumatologist and was prescribed hydroxychloroquine in the past but never did take it.  Her previous rheumatologist was at [facility] clinic in [facility] (Dr Ephriam Jenkins)   Exam:  BP 129/86 (BP Location: Right Arm, Patient Position: Sitting, Cuff Size: Normal)    Pulse 72    Ht 5\' 6"  (1.676 m)    Wt 182 lb 1.6 oz (82.6 kg)    SpO2 97%    BMI 29.39 kg/m  General: Well Developed, well nourished, and in no acute distress.   MSK:  Right shoulder normal-appearing tender to palpation decreased range of motion to abduction external and internal rotation.  Decreased strength abduction due to pain.  Left shoulder normal-appearing Tender palpation.  Decreased range of motion to  abduction external rotation and internal rotation.  Decreased into abduction due to pain.  Left hand swollen at first second third and fourth MCPs.  Decreased wrist and hand range of motion and stiffness.  Decreased grip strength.  Tender palpation across MCPs.  Knees bilaterally mild effusion normal motion with crepitation.  Antalgic gait.    Lab and Radiology Results Recent Results (from the past 2160 hour(s))  Sedimentation rate     Status: Abnormal   Collection Time: 09/27/21 11:10 AM  Result Value Ref Range   Sed Rate 43 (H) 0 - 30 mm/hr  Rheumatoid factor     Status: Abnormal   Collection Time: 09/27/21 11:10 AM  Result Value Ref Range   Rhuematoid fact SerPl-aCnc 159 (H) <51 IU/mL  Cyclic citrul peptide antibody, IgG     Status: Abnormal   Collection Time: 09/27/21 11:10 AM  Result Value Ref Range   Cyclic Citrullin Peptide Ab >250 (H) UNITS    Comment: Reference Range Negative:            <20 Weak Positive:       20-39 Moderate Positive:   40-59 Strong Positive:     >59 .   ANA     Status: Abnormal   Collection Time: 09/27/21 11:10 AM  Result Value Ref Range   Anti Nuclear Antibody (ANA) POSITIVE (A) NEGATIVE  Comment: ANA IFA is a first line screen for detecting the presence of up to approximately 150 autoantibodies in various autoimmune diseases. A positive ANA IFA result is suggestive of autoimmune disease and reflexes to titer and pattern. Further laboratory testing may be considered if clinically indicated. . For additional information, please refer to http://education.[facility].com/faq/FAQ177 (This link is being provided for informational/ educational purposes only.) .   Anti-nuclear ab-titer (ANA titer)     Status: Abnormal   Collection Time: 09/27/21 11:10 AM  Result Value Ref Range   ANA Titer 1 1:80 (H) titer    Comment: A low level ANA titer may be present in pre-clinical autoimmune diseases and normal individuals.                  Reference Range                 <1:40        Negative                 1:40-1:80    Low Antibody Level                 >1:80        Elevated Antibody Level .    ANA Pattern 1 Nuclear, Speckled (A)     Comment: Speckled pattern is associated with mixed connective tissue disease (MCTD), systemic lupus erythematosus (SLE), Sjogren's syndrome, dermatomyositis, and  systemic sclerosis/polymyositis overlap. . AC-2,4,5,29: Speckled . International Consensus on ANA Patterns (https://www.hernandez-brewer.com/)   D-dimer, quantitative     Status: Abnormal   Collection Time: 10/18/21  8:32 AM  Result Value Ref Range   D-Dimer, Quant 3.48 (H) 0.00 - 0.50 ug/mL-FEU    Comment: (NOTE) At the manufacturer cut-off value of 0.5 g/mL FEU, this assay has a negative predictive value of 95-100%.This assay is intended for use in conjunction with a clinical pretest probability (PTP) assessment model to exclude pulmonary embolism (PE) and deep venous thrombosis (DVT) in outpatients suspected of PE or DVT. Results should be correlated with clinical presentation. Performed at [facility], [address] [place], [place] [address]   Lipid panel     Status: Abnormal   Collection Time: 10/30/21  3:42 PM  Result Value Ref Range   Cholesterol 159 0 - 200 mg/dL    Comment: ATP III Classification       Desirable:  < 200 mg/dL               Borderline High:  200 - 239 mg/dL          High:  > = 240 mg/dL   Triglycerides 139.0 0.0 - 149.0 mg/dL    Comment: Normal:  <150 mg/dLBorderline High:  150 - 199 mg/dL   HDL 31.40 (L) >39.00 mg/dL   VLDL 27.8 0.0 - 40.0 mg/dL   LDL Cholesterol 100 (H) 0 - 99 mg/dL   Total CHOL/HDL Ratio 5     Comment:                Men          Women1/2 Average Risk     3.4          3.3Average Risk          5.0          4.42X Average Risk          9.6          7.13X Average Risk  15.0          11.0                       NonHDL 127.69     Comment: NOTE:   Non-HDL goal should be 30 mg/dL higher than patient's LDL goal (i.e. LDL goal of < 70 mg/dL, would have non-HDL goal of < 100 mg/dL)  Comprehensive metabolic panel     Status: Abnormal   Collection Time: 10/30/21  3:42 PM  Result Value Ref Range   Sodium 137 135 - 145 mEq/L   Potassium 4.1 3.5 - 5.1 mEq/L   Chloride 100 96 - 112 mEq/L   CO2 27 19 - 32 mEq/L   Glucose, Bld 90 70 - 99 mg/dL   BUN 14 6 - 23 mg/dL   Creatinine, Ser 1.11 0.40 - 1.20 mg/dL   Total Bilirubin 0.6 0.2 - 1.2 mg/dL   Alkaline Phosphatase 64 39 - 117 U/L   AST 27 0 - 37 U/L   ALT 17 0 - 35 U/L   Total Protein 8.1 6.0 - 8.3 g/dL   Albumin 4.4 3.5 - 5.2 g/dL   GFR 50.13 (L) >60.00 mL/min    Comment: Calculated using the CKD-EPI Creatinine Equation (2021)   Calcium 9.9 8.4 - 10.5 mg/dL  CBC     Status: None   Collection Time: 10/30/21  3:42 PM  Result Value Ref Range   WBC 8.5 4.0 - 10.5 K/uL   RBC 4.54 3.87 - 5.11 Mil/uL   Platelets 308.0 150.0 - 400.0 K/uL   Hemoglobin 13.0 12.0 - 15.0 g/dL   HCT 39.9 36.0 - 46.0 %   MCV 87.9 78.0 - 100.0 fl   MCHC 32.5 30.0 - 36.0 g/dL   RDW 13.5 11.5 - 15.5 %  CK (Creatine Kinase)     Status: Abnormal   Collection Time: 10/30/21  3:42 PM  Result Value Ref Range   Total CK 285 (H) 7 - 177 U/L  VITAMIN D 25 Hydroxy (Vit-D Deficiency, Fractures)     Status: None   Collection Time: 10/30/21  3:42 PM  Result Value Ref Range   VITD 49.90 30.00 - 100.00 ng/mL  Vitamin B12     Status: None   Collection Time: 10/30/21  3:42 PM  Result Value Ref Range   Vitamin B-12 651 211 - 911 pg/mL  Magnesium     Status: None   Collection Time: 10/30/21  3:42 PM  Result Value Ref Range   Magnesium 1.7 1.5 - 2.5 mg/dL        Assessment and Plan: 71 y.o. female with diffuse arthralgias thought to be due to rheumatoid arthritis. After discussion with this [place] it appears that she does have at least a tentative diagnosis of rheumatoid arthritis and at some point at least was  started on her attempted to be started on a disease modifying antirheumatic. Over the last few months it seems as though her underlying inflammatory condition has worsened quite a bit and today she has significant inflammatory arthroscopy across multiple different joints.  She has to many different joints to attempt to inject today however I do think that a systemic steroid would be helpful.  Plan to prescribe prednisone Dosepak for 12 days.  Additionally refer back to her existing rheumatologist at [facility] clinic and check back with me in 2 weeks.  At that point once we have put the fire out I suspect she will have fewer different  pain points and will be more treatable with isolated injections if needed.  Ultimately spent a lot of time talking about that I think her underlying problem is rheumatoid arthritis or at least something similar to rheumatoid arthritis and she very likely will require a disease modifying antirheumatic in order to not have problems like this in the future.  Total encounter time 40 minutes including face-to-face time with the patient and, reviewing past medical record, and charting on the date of service.   Extensive discussion and chart review.   PDMP not reviewed this encounter. Orders Placed This Encounter  Procedures   Ambulatory referral to Rheumatology    Referral Priority:   Routine    Referral Type:   Consultation    Referral Reason:   Specialty Services Required    Referred to Provider:   Quintin Alto, MD    Requested Specialty:   Rheumatology    Number of Visits Requested:   1   Meds ordered this encounter  Medications   predniSONE (STERAPRED UNI-PAK 48 TAB) 10 MG (48) TBPK tablet    Sig: Take by mouth daily. 12 day dosepack po    Dispense:  48 tablet    Refill:  0     Discussed warning signs or symptoms. Please see discharge instructions. Patient expresses understanding.   The above documentation has been reviewed and is accurate and complete  Lynne Leader, M.D.

## 2021-11-29 NOTE — ED Provider Notes (Addendum)
[facility] Provider Note    Event Date/Time   First MD Initiated Contact with Patient 11/29/21 [phone]     (approximate)   History   Arm Pain and Knee Pain   HPI  Jessica [name] is a 71 y.o. female with a past medical history of HDL, CAD status post CABG, breast cancer, migraine headaches and some chronic relapsing intermittent joint pains in her wrists elbows and knees which she states became fairly severe over the last 3 to 4 days.  She attributes this to working out heavily in the gym about 2 weeks ago although it seems that her pain was fairly minimal in the days immediately following the work-up.  She describes her pain is very symmetric between the left and right arms and legs.  She has not had any fevers, rashes, swelling in the joints, headache, earache, sore throat, nausea, vomiting, diarrhea, chest pain, abdominal pain, or any other clear associated sick symptoms.  She does not recall any injuries.  She denies any history of gout or pseudogout.  She states she takes Tylenol and Voltaren gel but this has not helped.  He states she cannot take any NSAIDs.  She states she has a leftover prednisone from previous course prescribed to her that she tried over the last 2 or 3 days but does not feel it helped.  No other acute concerns at this time.    Past Medical History:  Diagnosis Date   Allergy    Breast cancer ([place]) 2000   Cancer ([place])    breast   Colon polyp    Coronary artery disease    GERD (gastroesophageal reflux disease)    History of blood transfusion    Hyperlipidemia    Migraine    history of migrarines, none as an adult   Personal history of radiation therapy 2000     Physical Exam  Triage Vital Signs: ED Triage Vitals [11/29/21 0721]  Enc Vitals Group     BP 129/86     Pulse Rate 72     Resp 16     Temp 97.9 F (36.6 C)     Temp Source Oral     SpO2 97 %     Weight 182 lb 1.6 oz (82.6 kg)     Height 5' 6"  (1.676 m)      Head Circumference      Peak Flow      Pain Score 10     Pain Loc      Pain Edu?      Excl. in [place]?     Most recent vital signs: Vitals:   11/29/21 0721  BP: 129/86  Pulse: 72  Resp: 16  Temp: 97.9 F (36.6 C)  SpO2: 97%    General: Awake, no distress.  CV:  Good peripheral perfusion.  Resp:  Normal effort.  Abd:  No distention.  Other:  Patient has symmetric strength and range of motion of the bilateral upper and lower extremities although does seem to have some discomfort on ranging at the shoulder elbow and wrist bilaterally.  2+ radial pulses.  Sensation is intact in the distribution of radial ulnar and median nerves.  There is no significant induration fluctuance tenderness or large effusions.  Patient has symmetric strength of the bilateral knees and ankles as well as hips.  There is no large effusions or overlying skin changes of the joints.  Lower extremities are warm and well-perfused.  No overlying skin changes  over the knees.   ED Results / Procedures / Treatments  Labs (all labs ordered are listed, but only abnormal results are displayed) Labs Reviewed - No data to display   EKG    RADIOLOGY    PROCEDURES:  Critical Care performed: No  Procedures   MEDICATIONS ORDERED IN ED: Medications - No data to display   IMPRESSION / MDM / [place] / ED COURSE  I reviewed the triage vital signs and the nursing notes.                              Differential diagnosis includes, but is not limited to tendinitis, gout/pseudogout, osteoarthritis, autoimmune arthritis such as rheumatoid arthritis with very low suspicion at this time based on patient's history and exam for an acute fracture or infectious process.  There is no evidence of neurovascular deficit.  Patient recently discontinued PSK 9 inhibitor and had CK drawn at visit on 1/9 that I also reviewed.  CK at that time minimally elevated to 85 not suggestive clinically significant rhabdo.  Possibly  related to statin therapy.  Do not believe this would likely still be causing patient's symptoms.  I reviewed patient's prior visits for similar pain including visit with Dr. Amalia Hailey in December 7 where multiple autoimmune labs were ordered and I was able to see these results which are all suggestive of an autoimmune process as patient had an elevated ANA titer, greater than 210 cyclic Citrulline peptide antibodies, elevated rheumatoid factor, and elevated ESR.  Discussed at length with the patient my concern for autoimmune process such as rheumatoid arthritis or lupus.  Offered prednisone, gabapentin and lidocaine patches which she is declined at this time.  She is quite frustrated that I cannot definitively tell her exactly what is causing her symptoms or give her something that will immediately take away her pain.  Unfortunately seems she had canceled appointment with sports medicine physician she had for today advised her that she should attempt to reschedule this or even attempt to see if she can get back in today as Dr. Amalia Hailey seems to have initiated patient's autoimmune work-up and would likely be the most appropriate follow-up for patient's symptoms.  I offered to help obtain plain films of the knees and has not been imaged recently to assess for effusion or loose calcium bodies in the joint that could be contributing to symptoms.  She declined this as well.  Do not believe given overall well appearance of patient ambulatory stable vitals with reassuring exam and the further diagnostic studies are required in emergency room.  Discussed importance of close outpatient PCP and sports medicine follow-up.  Discussed that she will need to see a rheumatologist but that she should keep her appointment if possible today.  Discharged stable condition.  Strict return precautions advised and discussed.      FINAL CLINICAL IMPRESSION(S) / ED DIAGNOSES   Final diagnoses:  Pain in joint, multiple sites     Rx  / DC Orders   ED Discharge Orders     None        Note:  This document was prepared using Dragon voice recognition software and may include unintentional dictation errors.   Lucrezia Starch, MD 11/29/21 3128    Lucrezia Starch, MD 11/29/21 [phone]

## 2021-11-29 NOTE — ED Notes (Signed)
See triage note  presents with general body soreness   states pain is mainly to both knees  ambulates slowly d/t pain  Dr Creig Hines in with pt on arrival

## 2021-11-29 NOTE — Patient Instructions (Addendum)
Good to see you.  Please call [facility] at [phone].  I've prescribed you prednisone.  Follow-up: 2 weeks

## 2021-12-01 IMAGING — MG DIGITAL SCREENING BILAT W/ TOMO W/ CAD
8 series · 8 of 24 positions shown · non-contrast
Comparison: Previous exam(s).

CLINICAL DATA: Screening.

EXAM:
DIGITAL SCREENING BILATERAL MAMMOGRAM WITH TOMO AND CAD

[R MLO synth-2D]
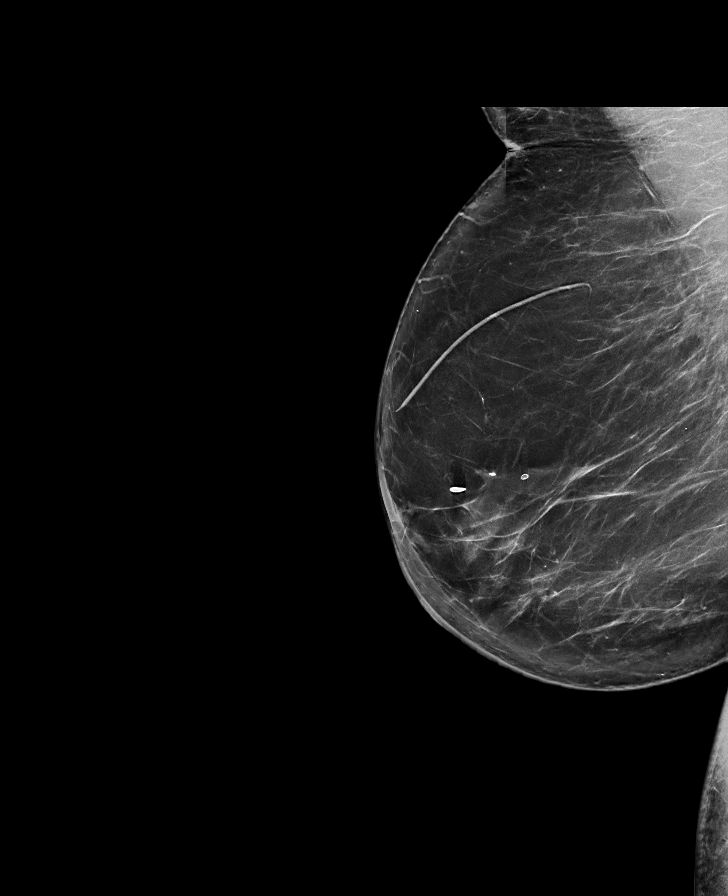

[L MLO synth-2D]
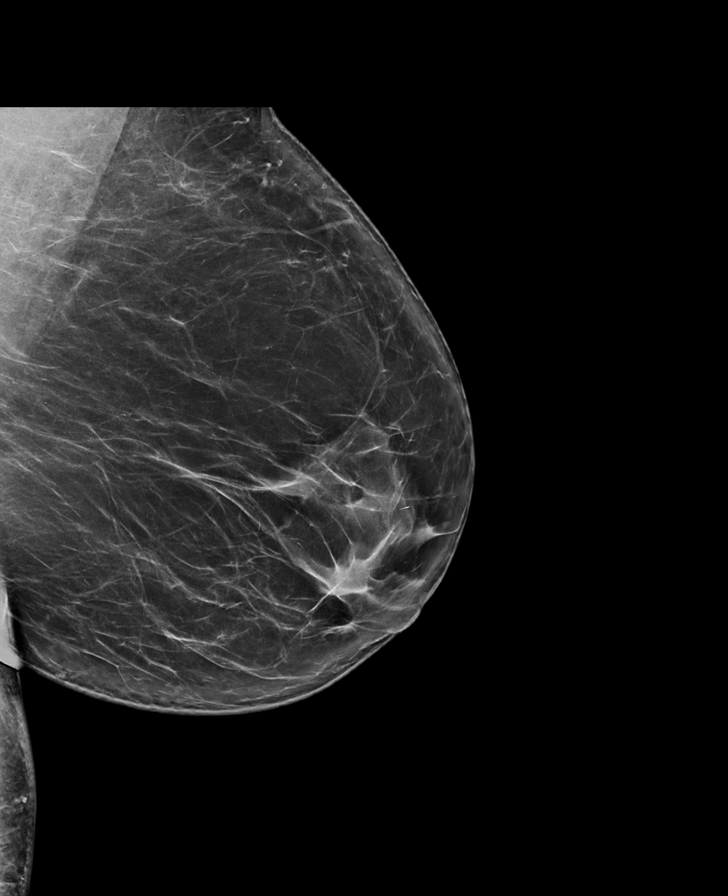

[R CC synth-2D]
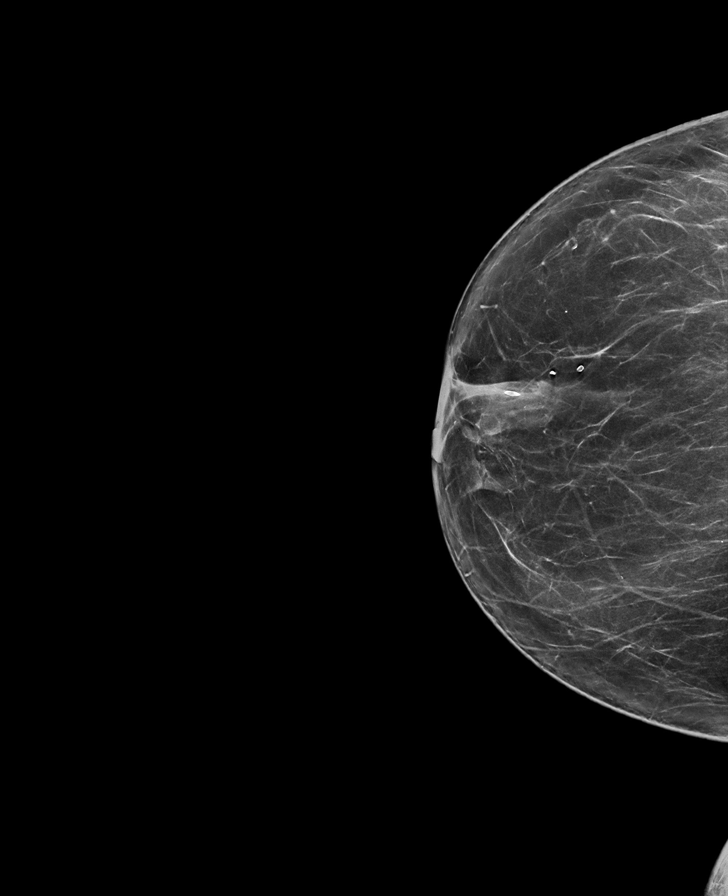

[L CC synth-2D]
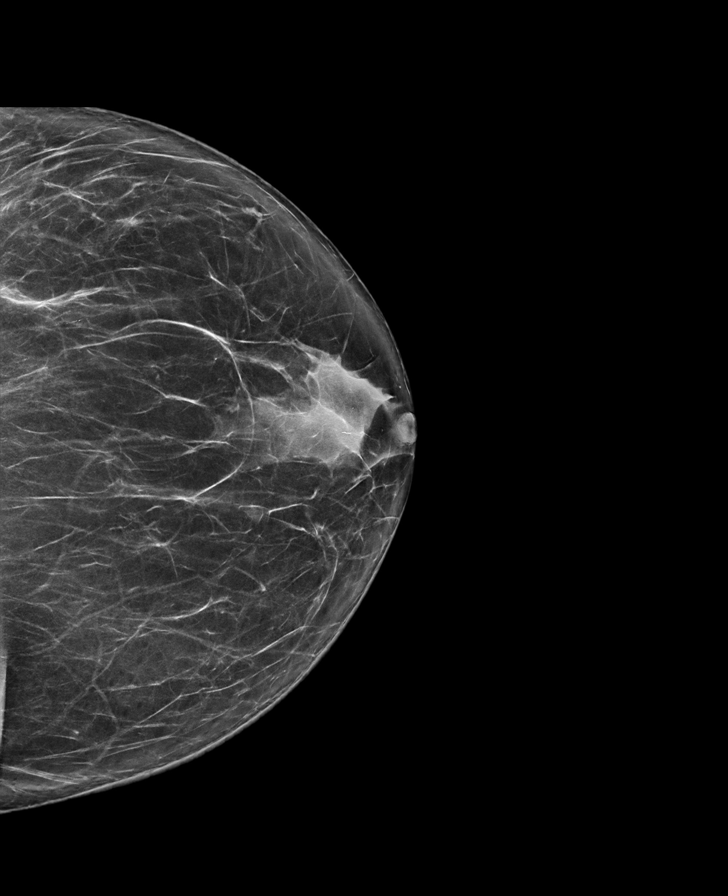

[R MLO tomo · tomo slice 45/90.0]
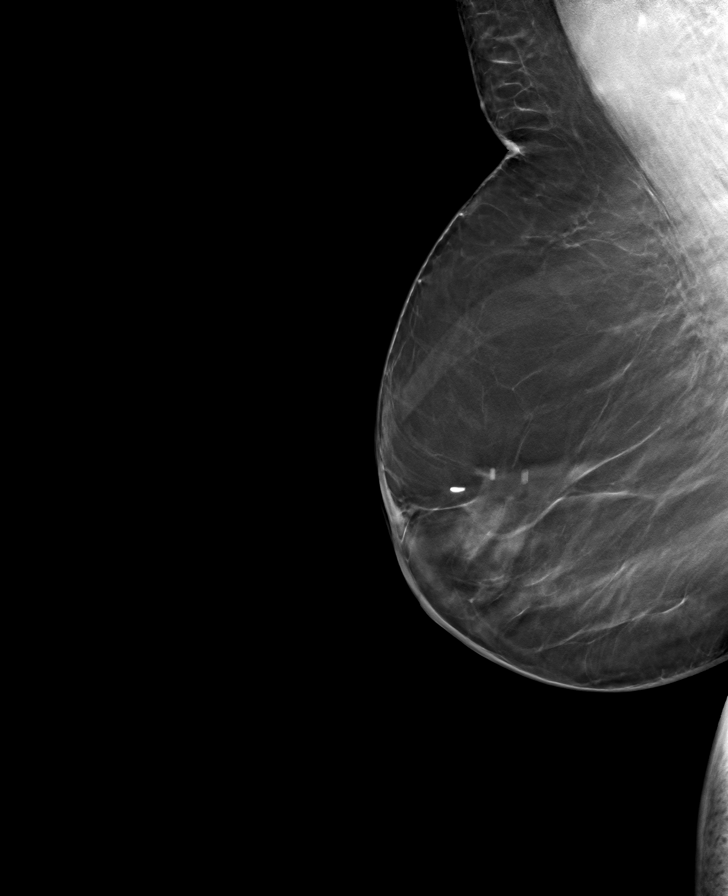

[L MLO tomo · tomo slice 47/92.0]
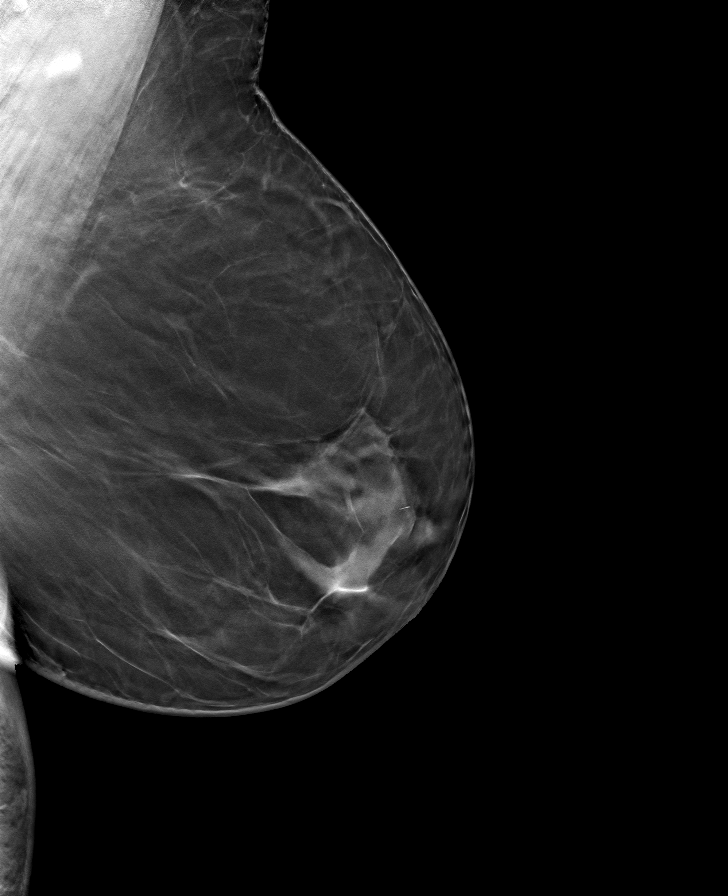

[R CC tomo · tomo slice 37/74.0]
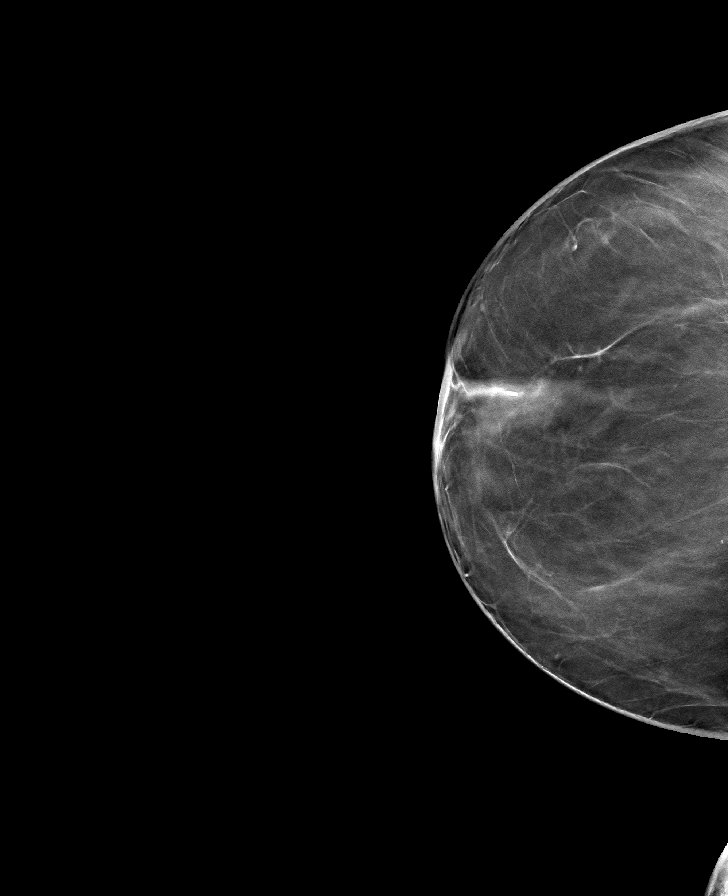

[L CC tomo · tomo slice 38/75.0]
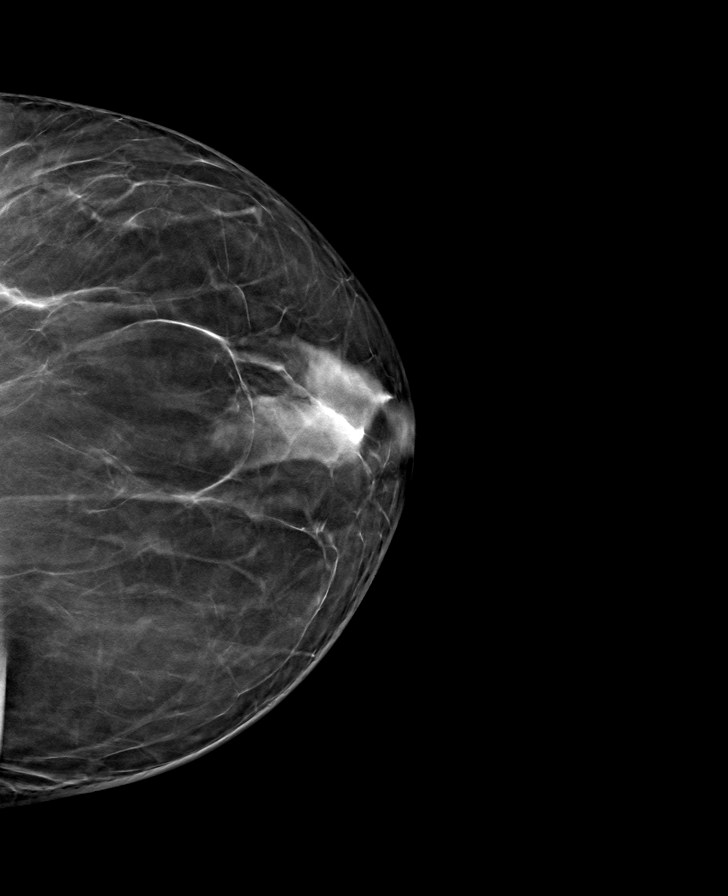

[8 of 24 positions shown; findings below may reference images not displayed]

ACR Breast Density Category b: There are scattered areas of
fibroglandular density.
FINDINGS: There are no findings suspicious for malignancy. Images were
processed with CAD.
IMPRESSION: No mammographic evidence of malignancy. A result letter of this
screening mammogram will be mailed directly to the patient.

RECOMMENDATION:
Screening mammogram in one year. (Code:[id number])

BI-RADS CATEGORY  1: Negative.

## 2021-12-07 ENCOUNTER — Other Ambulatory Visit: Payer: Self-pay

## 2021-12-07 ENCOUNTER — Ambulatory Visit: Payer: Medicare PPO | Admitting: Dermatology

## 2021-12-07 DIAGNOSIS — L820 Inflamed seborrheic keratosis: Secondary | ICD-10-CM

## 2021-12-07 DIAGNOSIS — L821 Other seborrheic keratosis: Secondary | ICD-10-CM

## 2021-12-07 NOTE — Progress Notes (Signed)
° °  Follow-Up Visit   Subjective  Jessica [name] is a 71 y.o. female who presents for the following: skin tags (Face, neck, irritating, >34m, getting larger). The patient has spots, moles and lesions to be evaluated, some may be new or changing and the patient has concerns that these could be cancer.  The following portions of the chart were reviewed this encounter and updated as appropriate:   Tobacco   Allergies   Meds   Problems   Med Hx   Surg Hx   Fam Hx      Review of Systems:  No other skin or systemic complaints except as noted in HPI or Assessment and Plan.  Objective  Well appearing patient in no apparent distress; mood and affect are within normal limits.  A focused examination was performed including face, neck. Relevant physical exam findings are noted in the Assessment and Plan.  R cheek x 4 (4) Stuck on waxy paps with erythema      Assessment & Plan  Inflamed seborrheic keratosis (4) R cheek x 4 Irritating; growing; symptomatic Discussed treatment (ED) and risk of dyschromia and scarring.  Will txt 4 today and let pt see how they heal.  If pt is pleased with outcome will plan to treat more on f/u  Destruction of lesion - R cheek x 4 Complexity: simple   Destruction method comment:  Electrodesiccation Informed consent: discussed and consent obtained   Timeout:  patient name, date of birth, surgical site, and procedure verified Patient was prepped and draped in usual sterile fashion: patient was prepped with isopropyl alcohol. Outcome: patient tolerated procedure well with no complications    Seborrheic Keratoses - Stuck-on, waxy, tan-brown papules and/or plaques  - Benign-appearing - Discussed benign etiology and prognosis. - Observe - Call for any changes  Return in about 2 months (around 02/04/2022) for ISK.  I, Othelia Pulling, RMA, am acting as scribe for Sarina Ser, MD . Documentation: I have reviewed the above documentation for accuracy and  completeness, and I agree with the above.  Sarina Ser, MD

## 2021-12-07 NOTE — Patient Instructions (Signed)

## 2021-12-11 NOTE — Progress Notes (Deleted)
° °  I, [name], LAT, ATC, am serving as scribe for Dr. Lynne Leader.  Jessica [name] is a 71 y.o. female who presents to [place] at [facility] today for B arm and B knee pain f/u due to RA.  She was last seen by Dr. Georgina Snell on 11/29/21 for B arm and B knee pain and prior to that for R hand and L thumb pain and was prescribed oral prednisone.  She has completed 5 OT sessions for her R hand/L thumb and was d/c.  She was referred to rheumat[facility]) for f/u.  Today, pt reports  Diagnostic testing: Rheumatologic labs- 09/27/21; R and L hand XR- 09/27/21; L knee XR- 08/25/20; R and L shoulder XR- 06/03/20; C-spine XR- 05/24/20  Pertinent review of systems: ***  Relevant historical information: ***   Exam:  There were no vitals taken for this visit. General: Well Developed, well nourished, and in no acute distress.   MSK: ***    Lab and Radiology Results No results found for this or any previous visit (from the past 72 hour(s)). No results found.     Assessment and Plan: 71 y.o. female with ***   PDMP not reviewed this encounter. No orders of the defined types were placed in this encounter.  No orders of the defined types were placed in this encounter.    Discussed warning signs or symptoms. Please see discharge instructions. Patient expresses understanding.   ***

## 2021-12-12 DIAGNOSIS — Z7960 Long term (current) use of unspecified immunomodulators and immunosuppressants: Secondary | ICD-10-CM | POA: Diagnosis not present

## 2021-12-12 DIAGNOSIS — M0579 Rheumatoid arthritis with rheumatoid factor of multiple sites without organ or systems involvement: Secondary | ICD-10-CM | POA: Diagnosis not present

## 2021-12-12 DIAGNOSIS — M069 Rheumatoid arthritis, unspecified: Secondary | ICD-10-CM | POA: Diagnosis not present

## 2021-12-13 ENCOUNTER — Ambulatory Visit: Payer: Medicare PPO | Admitting: Family Medicine

## 2021-12-16 ENCOUNTER — Encounter: Payer: Self-pay | Admitting: Dermatology

## 2022-01-03 ENCOUNTER — Encounter: Payer: Self-pay | Admitting: Cardiovascular Disease

## 2022-01-03 ENCOUNTER — Ambulatory Visit: Payer: Medicare PPO | Admitting: Cardiovascular Disease

## 2022-01-03 ENCOUNTER — Other Ambulatory Visit: Payer: Self-pay

## 2022-01-03 VITALS — BP 115/64 | HR 57 | Ht 66.0 in | Wt 172.5 lb

## 2022-01-03 DIAGNOSIS — I25118 Atherosclerotic heart disease of native coronary artery with other forms of angina pectoris: Secondary | ICD-10-CM | POA: Diagnosis not present

## 2022-01-03 DIAGNOSIS — I10 Essential (primary) hypertension: Secondary | ICD-10-CM | POA: Diagnosis not present

## 2022-01-03 DIAGNOSIS — Z951 Presence of aortocoronary bypass graft: Secondary | ICD-10-CM | POA: Diagnosis not present

## 2022-01-03 DIAGNOSIS — E782 Mixed hyperlipidemia: Secondary | ICD-10-CM | POA: Diagnosis not present

## 2022-01-03 NOTE — Progress Notes (Signed)
Cardiology Office Note ? ?Date:  January 26, 2022  ? ?ID:  Jessica [name], DOB [birth year], MRN [id number] ? ?PCP:  Hoyt Koch, MD  ? ?Chief Complaint  ?Patient presents with  ? 6 month follow up   ?  "Doing well." Medications reviewed by the patient verbally.   ? ? ?HPI:  ?Jessica [name] is a 71 -year-old woman with past medical history of ?Severe three-vessel CAD , CABG x5 by Dr. Cyndia Bent 01/05/2019  ?LIMA to the LAD, vein to diagonal branch, obtuse marginal branches 1 and 2 sequentially and to the RCA.   ?Hyperlipidemia ?Hypertension ?Statin intolerance ?Breast cancer ?Who presents for follow-up of her shortness of breath and chest pain ? ?Last seen in clinic by myself May 2022 ? ?In follow-up today reports that she was unable to tolerate Leqvio ?Since the injection, feels she has had acceleration of her rheumatoid arthritis particularly in the hands ?She missed appointment for second dose February 2023 given concern for side effects ? ?Reports that she has had statin myalgias ?Intolerance to Zetia, presumed to be myalgias ?Currently does not want to be on cholesterol medication ? ?Though she is concerned about any progression of her coronary disease ? ?Active at baseline, denies shortness of breath or chest pain on exertion ? ?EKG personally reviewed by myself on todays visit ?Normal sinus rhythm rate 57 bpm no significant ST-T wave changes ? ?Other past imaging studies reviewed ?Echocardiogram 01/27/20 ?Normal LV function ?Moderate elevated right heart pressures ? ?Lab work reviewed ?A1c 5.9 ? ?Lab Results  ?Component Value Date  ? CHOL 159 10/30/2021  ? HDL 31.40 (L) 10/30/2021  ? LDLCALC 100 (H) 10/30/2021  ? TRIG 139.0 10/30/2021  ? ? ?Prior cardiac history ? [facility] on 12/31/2018 with unstable angina and underwent cardiac catheterization by Dr. Irish Lack the following day revealing three-vessel disease with preserved LV function.   ? ? 01/05/2019 she underwent CABG x5 by Dr. Cyndia Bent the  LIMA to her LAD, vein to diagonal branch, RCA and obtuse marginal branches 1 and 2 sequentially. ?  ? stopped her Repatha because of intolerance.  previously tried praluent ?aching ? intolerant to statin drugs as well. Crestor, lipitor, and others ? ?PMH:   has a past medical history of Allergy, Breast cancer ([place]) 01-27-99), Cancer ([place]), Colon polyp, Coronary artery disease, GERD (gastroesophageal reflux disease), History of blood transfusion, Hyperlipidemia, Migraine, and Personal history of radiation therapy 01-27-99). ? ?PSH:    ?Past Surgical History:  ?Procedure Laterality Date  ? ABDOMINAL HYSTERECTOMY    ? BREAST LUMPECTOMY Right 27-Jan-1999  ? BREAST SURGERY Right   ? calcifaction removed  ? BUNIONECTOMY Bilateral   ? COLONOSCOPY W/ POLYPECTOMY    ? CORONARY ARTERY BYPASS GRAFT N/A 01/05/2019  ? Procedure: CORONARY ARTERY BYPASS GRAFTING (CABG), using right leg saphenous endoscopic and open vein harvest, exploration left leg;  Surgeon: Gaye Pollack, MD;  Location: [place] OR;  Service: Open Heart Surgery;  Laterality: N/A;  ? EYE SURGERY Bilateral   ? cataract  ? hemorrhoid    ? LEFT HEART CATH AND CORONARY ANGIOGRAPHY N/A 01/01/2019  ? Procedure: LEFT HEART CATH AND CORONARY ANGIOGRAPHY;  Surgeon: Jettie Booze, MD;  Location: [place] [facility] LAB;  Service: Cardiovascular;  Laterality: N/A;  ? STERNAL WIRES REMOVAL N/A 05/11/2019  ? Procedure: STERNAL WIRES REMOVAL;  Surgeon: Gaye Pollack, MD;  Location: [place];  Service: Thoracic;  Laterality: N/A;  ? TEE WITHOUT CARDIOVERSION N/A 01/05/2019  ? Procedure:  TRANSESOPHAGEAL ECHOCARDIOGRAM (TEE);  Surgeon: Gaye Pollack, MD;  Location: [place];  Service: Open Heart Surgery;  Laterality: N/A;  ? ? ?Current Outpatient Medications  ?Medication Sig Dispense Refill  ? Ascorbic Acid (VITAMIN C) 1000 MG tablet Take 2,000 mg by mouth daily.    ? aspirin EC 81 MG tablet Take 1 tablet (81 mg total) by mouth daily. Swallow whole. 90 tablet 3  ? cetirizine (ZYRTEC) 10 MG tablet  Take 1 tablet (10 mg total) by mouth daily. 30 tablet 5  ? Cholecalciferol (VITAMIN D-3) 125 MCG (5000 UT) TABS Take 5,000 Units by mouth daily.    ? folic acid (FOLVITE) 1 MG tablet Take 1 mg by mouth daily.    ? hydroxychloroquine (PLAQUENIL) 200 MG tablet Take 200 mg by mouth 2 (two) times daily.    ? loratadine (CLARITIN) 10 MG tablet Take 10 mg by mouth every evening.     ? magnesium 30 MG tablet Take 30 mg by mouth daily.    ? methotrexate (RHEUMATREX) 2.5 MG tablet Take 10 mg by mouth once a week.    ? mometasone (ELOCON) 0.1 % cream Apply 1 application topically daily as needed (Rash). 15 g 0  ? Multiple Vitamin (MULTIVITAMIN WITH MINERALS) TABS tablet Take 1 tablet by mouth daily.    ? predniSONE (STERAPRED UNI-PAK 48 TAB) 10 MG (48) TBPK tablet Take by mouth daily. 12 day dosepack po (Patient not taking: Reported on 01/03/2022) 48 tablet 0  ? ?No current facility-administered medications for this visit.  ? ? ? ?Allergies:   Codeine, Repatha [evolocumab], Sulfa antibiotics, and Sulfacetamide  ? ?Social History:  The patient  reports that she has never smoked. She has never used smokeless tobacco. She reports that she does not drink alcohol and does not use drugs.  ? ?Family History:   family history includes Asthma in her paternal aunt; Diabetes in her paternal aunt; Heart attack (age of onset: 75) in her father; Heart disease in her brother; Kidney disease in her paternal uncle; Stroke in her paternal uncle.  ? ?Review of Systems: ?Review of Systems  ?Constitutional: Negative.   ?HENT: Negative.    ?Respiratory: Negative.    ?Cardiovascular: Negative.   ?Gastrointestinal: Negative.   ?Musculoskeletal: Negative.   ?Neurological: Negative.   ?Psychiatric/Behavioral: Negative.    ?All other systems reviewed and are negative. ? ?PHYSICAL EXAM: ?VS:  BP 115/64 (BP Location: Left Arm, Patient Position: Sitting, Cuff Size: Normal)   Pulse (!) 57   Ht '5\' 6"'$  (1.676 m)   Wt 172 lb 8 oz (78.2 kg)   SpO2 98%   BMI  27.84 kg/m?  , BMI Body mass index is 27.84 kg/m?[name] ?Constitutional:  oriented to person, place, and time. No distress.  ?HENT:  ?Head: Grossly normal ?Eyes:  no discharge. No scleral icterus.  ?Neck: No JVD, no carotid bruits  ?Cardiovascular: Regular rate and rhythm, no murmurs appreciated ?Pulmonary/Chest: Clear to auscultation bilaterally, no wheezes or rails ?Abdominal: Soft.  no distension.  no tenderness.  ?Musculoskeletal: Normal range of motion ?Neurological:  normal muscle tone. Coordination normal. No atrophy ?Skin: Skin warm and dry ?Psychiatric: normal affect, pleasant ? ?Recent Labs: ?01/27/2021: Pro B Natriuretic peptide (BNP) 25.0; TSH 2.31 ?10/30/2021: ALT 17; BUN 14; Creatinine, Ser 1.11; Hemoglobin 13.0; Magnesium 1.7; Platelets 308.0; Potassium 4.1; Sodium 137  ? ? ?Lipid Panel ?Lab Results  ?Component Value Date  ? CHOL 159 10/30/2021  ? HDL 31.40 (L) 10/30/2021  ? LDLCALC 100 (H) 10/30/2021  ?  TRIG 139.0 10/30/2021  ? ?  ? ?Wt Readings from Last 3 Encounters:  ?01/03/22 172 lb 8 oz (78.2 kg)  ?11/29/21 182 lb 1.6 oz (82.6 kg)  ?11/29/21 182 lb 1.6 oz (82.6 kg)  ?  ? ?ASSESSMENT AND PLAN: ? ?Problem List Items Addressed This Visit   ? ?  ? Cardiology Problems  ? Hyperlipidemia  ? Relevant Orders  ? EKG 12-Lead  ? ?Other Visit Diagnoses   ? ? Coronary artery disease of native artery of native heart with stable angina pectoris ([place])    -  Primary  ? Relevant Orders  ? EKG 12-Lead  ? Benign essential HTN      ? Relevant Orders  ? EKG 12-Lead  ? Hx of CABG      ? ?  ? ?CAD with stable angina ?History of bypass, 2020 ?Reports doing well with no anginal symptoms ?No further ischemic work-up needed at this time ? ?Hyperlipidemia ?Reports statin myalgias ?Has had side effects on PCSK9 inhibitors, tried Repatha, Praluent had side effects ?Had one shot of Leqvio , numbers dropped , CK elevated ?"That is when RA started in hands" ?Does not want a second injection ?Discussed bempedoic acid, retrial of Zetia ?She  is hesitant at this time ? ? ? Total encounter time more than 30 minutes ? Greater than 50% was spent in counseling and coordination of care with the patient ? ? ? ?Signed, ?Esmond Plants, M.D., Ph.D. ?[facility]

## 2022-01-03 NOTE — Patient Instructions (Signed)
Medication Instructions:  No changes  If you need a refill on your cardiac medications before your next appointment, please call your pharmacy.   Lab work: No new labs needed  Testing/Procedures: No new testing needed  Follow-Up: At [facility], you and your health needs are our priority.  As part of our continuing mission to provide you with exceptional heart care, we have created designated Provider Care Teams.  These Care Teams include your primary Cardiologist (physician) and Advanced Practice Providers (APPs -  Physician Assistants and Nurse Practitioners) who all work together to provide you with the care you need, when you need it.  You will need a follow up appointment in 12 months  Providers on your designated Care Team:   Christopher Berge, NP Ryan Dunn, PA-C Cadence Furth, PA-C  COVID-19 Vaccine Information can be found at: [email or web address] For questions related to vaccine distribution or appointments, please email [email or web address] or call [phone].   

## 2022-01-24 DIAGNOSIS — M0579 Rheumatoid arthritis with rheumatoid factor of multiple sites without organ or systems involvement: Secondary | ICD-10-CM | POA: Diagnosis not present

## 2022-01-24 DIAGNOSIS — Z7960 Long term (current) use of unspecified immunomodulators and immunosuppressants: Secondary | ICD-10-CM | POA: Diagnosis not present

## 2022-02-12 DIAGNOSIS — M069 Rheumatoid arthritis, unspecified: Secondary | ICD-10-CM | POA: Diagnosis not present

## 2022-02-12 DIAGNOSIS — M79641 Pain in right hand: Secondary | ICD-10-CM | POA: Diagnosis not present

## 2022-02-14 ENCOUNTER — Ambulatory Visit: Payer: Medicare PPO | Admitting: Dermatology

## 2022-02-14 DIAGNOSIS — L821 Other seborrheic keratosis: Secondary | ICD-10-CM

## 2022-02-14 DIAGNOSIS — L820 Inflamed seborrheic keratosis: Secondary | ICD-10-CM

## 2022-02-14 NOTE — Patient Instructions (Addendum)

## 2022-02-14 NOTE — Progress Notes (Signed)
? ?  Follow-Up Visit ?  ?Subjective  ?Jessica [name] is a 71 y.o. female who presents for the following: ISK f/u (R cheek, ED x 4 at last visit, pt has others to treat today). ?The patient has spots, moles and lesions to be evaluated, some may be new or changing and the patient has concerns that these could be cancer. ? ?The following portions of the chart were reviewed this encounter and updated as appropriate:  ? Tobacco  Allergies  Meds  Problems  Med Hx  Surg Hx  Fam Hx   ?  ?Review of Systems:  No other skin or systemic complaints except as noted in HPI or Assessment and Plan. ? ?Objective  ?Well appearing patient in no apparent distress; mood and affect are within normal limits. ? ?A focused examination was performed including face. Relevant physical exam findings are noted in the Assessment and Plan. ? ?face x 16 (16) ?Stuck on waxy paps with erythema ? ?R and L Lower Eyelid margin x 2 (2) ?Stuck on waxy paps with erythema  ? ? ?Assessment & Plan  ?Inflamed seborrheic keratosis (16) ?face x 16 ? ?Destruction of lesion - face x 16 ?Complexity: simple   ?Destruction method comment:  Electrodesiccation ?Informed consent: discussed and consent obtained   ?Timeout:  patient name, date of birth, surgical site, and procedure verified ?Patient was prepped and draped in usual sterile fashion: patient was prepped with isopropyl alcohol. ?Outcome: patient tolerated procedure well with no complications   ? ?Seborrheic keratosis, inflamed (2) ?R and L Lower Eyelid margin x 2 ? ?Destruction of lesion - R and L Lower Eyelid margin x 2 ?Complexity: simple   ?Destruction method comment:  Electrodesiccation ?Informed consent: discussed and consent obtained   ?Timeout:  patient name, date of birth, surgical site, and procedure verified ?Patient was prepped and draped in usual sterile fashion: patient was prepped with isopropyl alcohol. ?Outcome: patient tolerated procedure well with no complications   ? ?Seborrheic  Keratoses ?- Stuck-on, waxy, tan-brown papules and/or plaques  ?- Benign-appearing ?- Discussed benign etiology and prognosis. ?- Observe ?- Call for any changes ? ?Return in about 3 months (around 05/16/2022) for ISK f/u. ? ?I, Othelia Pulling, RMA, am acting as scribe for Sarina Ser, MD . ?Documentation: I have reviewed the above documentation for accuracy and completeness, and I agree with the above. ? ?Sarina Ser, MD ? ?

## 2022-02-25 ENCOUNTER — Encounter: Payer: Self-pay | Admitting: Dermatology

## 2022-03-05 DIAGNOSIS — M06841 Other specified rheumatoid arthritis, right hand: Secondary | ICD-10-CM | POA: Diagnosis not present

## 2022-03-05 DIAGNOSIS — G5601 Carpal tunnel syndrome, right upper limb: Secondary | ICD-10-CM | POA: Diagnosis not present

## 2022-03-05 DIAGNOSIS — M06842 Other specified rheumatoid arthritis, left hand: Secondary | ICD-10-CM | POA: Diagnosis not present

## 2022-03-06 ENCOUNTER — Encounter: Payer: Self-pay | Admitting: Internal Medicine

## 2022-03-06 ENCOUNTER — Ambulatory Visit: Payer: Medicare PPO | Admitting: Internal Medicine

## 2022-03-06 VITALS — BP 124/90 | HR 61 | Resp 18 | Ht 66.0 in | Wt 166.6 lb

## 2022-03-06 DIAGNOSIS — E782 Mixed hyperlipidemia: Secondary | ICD-10-CM | POA: Diagnosis not present

## 2022-03-06 DIAGNOSIS — F5101 Primary insomnia: Secondary | ICD-10-CM | POA: Diagnosis not present

## 2022-03-06 DIAGNOSIS — M069 Rheumatoid arthritis, unspecified: Secondary | ICD-10-CM | POA: Diagnosis not present

## 2022-03-06 LAB — LIPID PANEL
Cholesterol: 163 mg/dL (ref 0–200)
HDL: 45.7 mg/dL (ref >39.00)
LDL Cholesterol: 106 mg/dL — ABNORMAL HIGH (ref 0–99)
NonHDL: 117.49
Total CHOL/HDL Ratio: 4
Triglycerides: 56 mg/dL (ref 0.0–149.0)
VLDL: 11.2 mg/dL (ref 0.0–40.0)

## 2022-03-06 LAB — C-REACTIVE PROTEIN: CRP: 3.9 mg/dL (ref 0.5–20.0)

## 2022-03-06 LAB — SEDIMENTATION RATE: Sed Rate: 89 mm/hr — ABNORMAL HIGH (ref 0–30)

## 2022-03-06 NOTE — Patient Instructions (Addendum)
We will check the labs today. ? ?Try taking a tylenol (500 mg or 1000 mg) before bedtime to see if this helps you sleep through the night ? ?Turmeric and tart cherry might help with inflammation.  ?

## 2022-03-06 NOTE — Progress Notes (Signed)
   Subjective:   Patient ID: Jessica [name], female    DOB: 08-Jan-1951, 71 y.o.   MRN: [id number]  HPI The patient is a 71 YO female coming in for problems sleeping and with her hands.   Review of Systems  Constitutional: Negative.   HENT: Negative.    Eyes: Negative.   Respiratory:  Negative for cough, chest tightness and shortness of breath.   Cardiovascular:  Negative for chest pain, palpitations and leg swelling.  Gastrointestinal:  Negative for abdominal distention, abdominal pain, constipation, diarrhea, nausea and vomiting.  Musculoskeletal:  Positive for arthralgias and myalgias.  Skin: Negative.   Neurological:  Positive for numbness.  Psychiatric/Behavioral:  Positive for sleep disturbance.    Objective:  Physical Exam Constitutional:      Appearance: She is well-developed.  HENT:     Head: Normocephalic and atraumatic.  Cardiovascular:     Rate and Rhythm: Normal rate and regular rhythm.  Pulmonary:     Effort: Pulmonary effort is normal. No respiratory distress.     Breath sounds: Normal breath sounds. No wheezing or rales.  Abdominal:     General: Bowel sounds are normal. There is no distension.     Palpations: Abdomen is soft.     Tenderness: There is no abdominal tenderness. There is no rebound.  Musculoskeletal:        General: Tenderness present.     Cervical back: Normal range of motion.  Skin:    General: Skin is warm and dry.  Neurological:     Mental Status: She is alert and oriented to person, place, and time.     Coordination: Coordination normal.    Vitals:   03/06/22 1007  BP: 124/90  Pulse: 61  Resp: 18  SpO2: 98%  Weight: 166 lb 9.6 oz (75.6 kg)  Height: '5\' 6"'$  (1.676 m)    Assessment & Plan:

## 2022-03-08 ENCOUNTER — Encounter: Payer: Self-pay | Admitting: Internal Medicine

## 2022-03-08 DIAGNOSIS — M069 Rheumatoid arthritis, unspecified: Secondary | ICD-10-CM | POA: Insufficient documentation

## 2022-03-08 DIAGNOSIS — G4700 Insomnia, unspecified: Secondary | ICD-10-CM | POA: Insufficient documentation

## 2022-03-08 NOTE — Assessment & Plan Note (Signed)
Suspect this is pain related due to waking up in pain. Able to fall asleep reasonable time frame. Asked her to take 500 mg or 1000 mg tylenol about 20 minutes before bed to see if this helps her sleep better through the night.

## 2022-03-08 NOTE — Assessment & Plan Note (Signed)
Checking ESR and CRP to assess inflammatory state. She is having new numbness and pain in her hands. The numbness is significantly improved s/p injection yesterday so we will wait to adjust medications for 1 week to see if this shot will help mre.

## 2022-03-08 NOTE — Assessment & Plan Note (Signed)
Checking lipid panel and lipoprotein A and adjust as needed. She is not taking statin currently due to myalgia associated.

## 2022-03-09 ENCOUNTER — Encounter: Payer: Self-pay | Admitting: Occupational Therapy

## 2022-03-09 ENCOUNTER — Ambulatory Visit: Payer: Medicare PPO | Attending: Orthopedic Surgery | Admitting: Occupational Therapy

## 2022-03-09 DIAGNOSIS — M79641 Pain in right hand: Secondary | ICD-10-CM | POA: Insufficient documentation

## 2022-03-09 DIAGNOSIS — M6281 Muscle weakness (generalized): Secondary | ICD-10-CM | POA: Insufficient documentation

## 2022-03-09 DIAGNOSIS — G5601 Carpal tunnel syndrome, right upper limb: Secondary | ICD-10-CM | POA: Insufficient documentation

## 2022-03-09 DIAGNOSIS — M79642 Pain in left hand: Secondary | ICD-10-CM | POA: Diagnosis not present

## 2022-03-09 DIAGNOSIS — M25641 Stiffness of right hand, not elsewhere classified: Secondary | ICD-10-CM | POA: Insufficient documentation

## 2022-03-09 DIAGNOSIS — M25642 Stiffness of left hand, not elsewhere classified: Secondary | ICD-10-CM | POA: Diagnosis not present

## 2022-03-09 NOTE — Therapy (Signed)
[facility] [address], [place], [address] Phone: [phone]   Fax:  [phone]  Occupational Therapy Evaluation  Patient Details  Name: Jessica [name] MRN: [id number] Date of Birth: Mar 19, 1951 Referring Provider (OT): Louanne Skye   Encounter Date: 03/09/2022   OT End of Session - 03/09/22 1341     Visit Number 1    Number of Visits 6    Date for OT Re-Evaluation 04/20/22    OT Start Time 1045    OT Stop Time 1138    OT Time Calculation (min) 53 min    Activity Tolerance Patient tolerated treatment well    Behavior During Therapy [facility] for tasks assessed/performed             Past Medical History:  Diagnosis Date   Allergy    Breast cancer ([place]) 2000   Cancer ([place])    breast   Colon polyp    Coronary artery disease    GERD (gastroesophageal reflux disease)    History of blood transfusion    Hyperlipidemia    Migraine    history of migrarines, none as an adult   Personal history of radiation therapy 2000    Past Surgical History:  Procedure Laterality Date   ABDOMINAL HYSTERECTOMY     BREAST LUMPECTOMY Right 2000   BREAST SURGERY Right    calcifaction removed   BUNIONECTOMY Bilateral    COLONOSCOPY W/ POLYPECTOMY     CORONARY ARTERY BYPASS GRAFT N/A 01/05/2019   Procedure: CORONARY ARTERY BYPASS GRAFTING (CABG), using right leg saphenous endoscopic and open vein harvest, exploration left leg;  Surgeon: Gaye Pollack, MD;  Location: [place];  Service: Open Heart Surgery;  Laterality: N/A;   EYE SURGERY Bilateral    cataract   hemorrhoid     LEFT HEART CATH AND CORONARY ANGIOGRAPHY N/A 01/01/2019   Procedure: LEFT HEART CATH AND CORONARY ANGIOGRAPHY;  Surgeon: Jettie Booze, MD;  Location: [place] [facility];  Service: Cardiovascular;  Laterality: N/A;   STERNAL WIRES REMOVAL N/A 05/11/2019   Procedure: STERNAL WIRES REMOVAL;  Surgeon: Gaye Pollack, MD;  Location: [facility];   Service: Thoracic;  Laterality: N/A;   TEE WITHOUT CARDIOVERSION N/A 01/05/2019   Procedure: TRANSESOPHAGEAL ECHOCARDIOGRAM (TEE);  Surgeon: Gaye Pollack, MD;  Location: [place];  Service: Open Heart Surgery;  Laterality: N/A;    There were no vitals filed for this visit.   Subjective Assessment - 03/09/22 1320     Subjective  My swelling and pain in my hands has been since February this year.  Both my hands are so swollen and they painful enough numbness in my right hand to in these 3 fingers.  I seen the rheumatologist but then I followed up with the orthopedist because my hands were still not better.  He gave me a shot of my right hand.  It is feeling much better now but I wanted to see you again to make sure that I do not get worse again.  I have seen you early last year but then they sent me to a different clinic in December.    Pertinent History Patient with increased pain since February 23 and bilateral hands with increased inflammation as well as swelling pain and numbness.  Patient report 10/10 pain in bilateral hands.  Did see since then Dr. Posey Pronto the rheumatologist as well as orthopedist on 03/05/22 and referred to this OT with bilateral hand rheumatoid  arthritis and carpal tunnel symptoms in the right hand.  Checking ESR and CRP to assess inflammatory state. She is having new numbness and pain in her hands. The numbness is significantly improved s/p injection yesterday by Dr. Louanne Skye so we will wait to adjust medications for 1 week to see if this shot will help mre.  Patient on prednisone to will be finishing that up in the next few days.    Patient Stated Goals I want you to help me to make sure that my hands does not get worse again I want to strengthen the mobility so I can do the things that I like to do.    Currently in Pain? No/denies   No pain this date but was up to earlier this week 10/10 prior to steroids and shot              [facility] OT Assessment - 03/09/22 0001        Assessment   Medical Diagnosis Rheumatoid arthritis in bilateral hands and carpal tunnel on the right    Referring Provider (OT) Louanne Skye    Onset Date/Surgical Date 11/22/21    Hand Dominance Right      Home  Environment   Lives With Spouse      Prior Function   Vocation Retired    Leisure Retired Radio producer.  Working out in [employer] 3 times a week, ER and housework      Strength   Right Hand Grip (lbs) 52    Right Hand Lateral Pinch 18 lbs    Right Hand 3 Point Pinch 11 lbs    Left Hand Grip (lbs) 41    Left Hand Lateral Pinch 14 lbs    Left Hand 3 Point Pinch 9 lbs      Right Hand AROM   R Thumb Opposition to Index --   hyper flexion at [facility] , decrease at IP ;ADDof CMC   R Index  MCP 0-90 85 Degrees    R Index PIP 0-100 95 Degrees    R Long  MCP 0-90 85 Degrees    R Long PIP 0-100 100 Degrees    R Ring  MCP 0-90 85 Degrees    R Ring PIP 0-100 100 Degrees    R Little  MCP 0-90 85 Degrees    R Little PIP 0-100 95 Degrees      Left Hand AROM   L Index  MCP 0-90 90 Degrees    L Index PIP 0-100 0 Degrees    L Long  MCP 0-90 90 Degrees    L Long PIP 0-100 95 Degrees    L Ring  MCP 0-90 80 Degrees    L Ring PIP 0-100 90 Degrees    L Little  MCP 0-90 90 Degrees    L Little PIP 0-100 90 Degrees                Reviewed with patient again home program for contrast 2 times a day morning for stiffness and pain in the evenings to calm things down. Further with Isotoner gloves to use at nighttime for swelling and pain. CMC neoprene splints for bilateral hands during the day with activities to prevent abduction of right CMC into carpal tunnel, can wear on the left 2 to decrease thumb CMC pain. Tendon glides as well as opposition 10 reps 2 times a day very light to do not forceful or squeeze. Review joint protection and home modifications in ADL's and IADL"S  using larger joints as well as building up handles or grips as well as respect for pain if pain lingers for 2 hours after  activity over the next day increased pain.  Look back what she done 12 to 24 hours before.  And modify those.                OT Education - 03/09/22 1340     Education Details Findings of evaluation and education of home program, splint use and joint protection    Person(s) Educated Patient    Methods Explanation;Demonstration;Tactile cues;Verbal cues;Handout    Comprehension Verbal cues required;Returned demonstration;Verbalized understanding                 OT Long Term Goals - 03/09/22 1347       OT LONG TERM GOAL #1   Title Patient to be independent in home program to increase active range of motion to within normal limits with decreased pain and edema in bilateral hands without being on prednisone    Baseline Patient on prednisone since Monday.  Continue to show some increase edema in bilateral hands and some MCP and PIP flexion decreased by 5 degrees, grip and prehension decreased compared to a year ago.    Time 3    Period Weeks    Status New    Target Date 03/30/22      OT LONG TERM GOAL #2   Title Patient reports 3 joint protection principles or adaptive equipment to increase ease or use of hands and ADLs    Baseline Patient needs reinforcement and reeducation    Time 6    Period Weeks    Target Date 04/20/22      OT LONG TERM GOAL #3   Title Patient's grip and prehension strength increase without pain to about the same then she was February of last year    Baseline Grip strength on the right 52, left 41.  Lateral pinch right 18 left 14 pounds.  Three-point pinch 11 pounds in the right left 9 pounds still in range for her age but significantly decreased compared to last year.    Time 6    Period Weeks    Status New    Target Date 04/20/22      OT LONG TERM GOAL #4   Title Patient to verbalize several resources in the community where she can do exercise to maintain her flexibility mobility and strength    Baseline Provided patient this date  information on arthritis lending series as well as exercise plus tai chi classes at senior center in community    Time 4    Period Weeks    Status New    Target Date 04/06/22                   Plan - 03/09/22 1341     Clinical Impression Statement Patient referred to OT with diagnosis of bilateral rheumatoid arthritis as well as carpal tunnel symptoms on the right.  Patient had a shot in the right carpal tunnel 03/05/2022 by orthopedist.  Also on prednisone that we will be finishing up in a few days-patient report decreased pain from a 10/10 this past Monday to today 1/10 in right hand.  Patient was seen by this OT February of last year.  Patient this date able to make a composite fist but decrease by about 5 degrees at some of the MCP and PIP joints.  Extension within normal limits.  Patient do  show increased thumb MC flexion with decreased IP flexion during opposition and three-point pinch with abduction of CMC into carpal tunnel on the right more than the left.  Patient also reports she was seen by therapy 5 months ago and was starting to do putty.  This date compared to more than a year ago patient grip strength ranging decreased in bilateral hands.  Reinforced with patient again the importance of decreasing pain and stiffness with increasing or maintaining her motion and grip strength will increase without grip strengthening.  Focus on decreasing inflammation by doing contrast, Isotoner gloves at night and joint protection and modifications.  Patient also to use CMC neoprene splint especially on the right to support thumb CMC out of carpal tunnel during prehension pinch and grip.  Patient to stop doing any putty.  Patient reports she changed her diet to [place] provided patient with information of arthritis lending series that is done on a monthly basis at the [place] senior [facility].  Also provided patient information on exercise classes and tai chi classes at [facility].  Patient  very interested in this information patient very motivated to follow new home program and will follow-up with OT next week.  Patient limited in use of bilateral hands and ADLs and IADLs by pain, swelling and inflammation.  Patient can benefit from skilled OT services to increase independence in use of bilateral hands.    OT Occupational Profile and History Problem Focused Assessment - Including review of records relating to presenting problem    Occupational performance deficits (Please refer to evaluation for details): ADL's;IADL's;Rest and Sleep;Play;Leisure;Social Participation    Body Structure / Function / Physical Skills ADL;Decreased knowledge of use of DME;Strength;Pain;Edema;UE functional use;IADL;ROM;Flexibility    Rehab Potential Good    Clinical Decision Making Several treatment options, min-mod task modification necessary    Comorbidities Affecting Occupational Performance: May have comorbidities impacting occupational performance    Modification or Assistance to Complete Evaluation  No modification of tasks or assist necessary to complete eval    OT Frequency 1x / week    OT Duration 6 weeks    OT Treatment/Interventions Self-care/ADL training;Moist Heat;DME and/or AE instruction;Splinting;Contrast Bath;Therapeutic exercise;Paraffin;Patient/family education;Manual Therapy    Consulted and Agree with Plan of Care Patient             Patient will benefit from skilled therapeutic intervention in order to improve the following deficits and impairments:   Body Structure / Function / Physical Skills: ADL, Decreased knowledge of use of DME, Strength, Pain, Edema, UE functional use, IADL, ROM, Flexibility       Visit Diagnosis: Pain in right hand - Plan: Ot plan of care cert/re-cert  Pain in left hand - Plan: Ot plan of care cert/re-cert  Stiffness of right hand, not elsewhere classified - Plan: Ot plan of care cert/re-cert  Stiffness of left hand, not elsewhere classified -  Plan: Ot plan of care cert/re-cert  Carpal tunnel syndrome, right upper limb - Plan: Ot plan of care cert/re-cert  Muscle weakness (generalized) - Plan: Ot plan of care cert/re-cert    Problem List Patient Active Problem List   Diagnosis Date Noted   Rheumatoid arthritis involving multiple sites ([place]) 03/08/2022   Insomnia 03/08/2022   Dysphagia 10/31/2021   Lymphadenopathy of head and neck 06/16/2021   Long-term use of immunosuppressant medication 05/31/2021   Eye drainage 03/17/2021   Fatigue 01/27/2021   Chronic pain of both shoulders 06/01/2020   Swelling of both hands 06/01/2020   Cervical  disc disorder with radiculopathy of cervical region 05/24/2020   Post inflammatory hypopigmentation 12/30/2019   Claudication in peripheral vascular disease ([place]) 12/22/2019   Arthritis of carpometacarpal (CMC) joint of left thumb 10/28/2019   Hand arthritis 10/01/2019   Statin myopathy 08/30/2019   SOB (shortness of breath) on exertion 06/19/2019   Hyperlipidemia 04/08/2019   S/P CABG x 5 01/05/2019   Left leg pain 09/30/2018   Degenerative joint disease of low back 01/09/2017   Routine general medical examination at a health care facility 10/02/2016   GERD (gastroesophageal reflux disease) 07/28/2016   H/O malignant neoplasm of breast 06/19/2016    Rosalyn Gess, OTR/L,CLT 03/09/2022, 1:54 PM  [facility] [address] [place], [address] Phone: [phone]   Fax:  [phone]  Name: Jessica [name] MRN: [id number] Date of Birth: 07-Apr-1951

## 2022-03-10 LAB — LIPOPROTEIN A (LPA): Lipoprotein (a): 187 nmol/L — ABNORMAL HIGH (ref <75)

## 2022-03-12 ENCOUNTER — Ambulatory Visit (INDEPENDENT_AMBULATORY_CARE_PROVIDER_SITE_OTHER): Payer: Medicare PPO

## 2022-03-12 VITALS — Ht 66.0 in | Wt 164.0 lb

## 2022-03-12 DIAGNOSIS — Z0000 Encounter for general adult medical examination without abnormal findings: Secondary | ICD-10-CM | POA: Diagnosis not present

## 2022-03-12 NOTE — Patient Instructions (Signed)
Jessica [name] , Thank you for taking time to come for your Medicare Wellness Visit. I appreciate your ongoing commitment to your health goals. Please review the following plan we discussed and let me know if I can assist you in the future.   Screening recommendations/referrals: Colonoscopy: 02/03/2014; due every 10 years Mammogram: 11/15/2021; due every year Bone Density: 09/23/2020; due every 2 years Recommended yearly ophthalmology/optometry visit for glaucoma screening and checkup Recommended yearly dental visit for hygiene and checkup  Vaccinations: Influenza vaccine: 07/14/2021 Pneumococcal vaccine: 07/27/2016, 09/16/2017 Tdap vaccine: 10/02/2016; due every 10 years Shingles vaccine: 10/25/2020, 12/22/2020   Covid-19: 01/02/2020, 4/13/221, 08/18/2200  Advanced directives: No; Advance directive discussed with you today. I have provided a copy for you to complete at home and have notarized. Once this is complete please bring a copy in to our office so we can scan it into your chart.  Conditions/risks identified: Yes  Next appointment: Please schedule your next Medicare Wellness Visit with your Nurse Health Advisor in 1 year by calling [phone].   Preventive Care 72 Years and Older, Female Preventive care refers to lifestyle choices and visits with your health care provider that can promote health and wellness. What does preventive care include? A yearly physical exam. This is also called an annual well check. Dental exams once or twice a year. Routine eye exams. Ask your health care provider how often you should have your eyes checked. Personal lifestyle choices, including: Daily care of your teeth and gums. Regular physical activity. Eating a healthy diet. Avoiding tobacco and drug use. Limiting alcohol use. Practicing safe sex. Taking low-dose aspirin every day. Taking vitamin and mineral supplements as recommended by your health care provider. What happens during an annual well  check? The services and screenings done by your health care provider during your annual well check will depend on your age, overall health, lifestyle risk factors, and family history of disease. Counseling  Your health care provider may ask you questions about your: Alcohol use. Tobacco use. Drug use. Emotional well-being. Home and relationship well-being. Sexual activity. Eating habits. History of falls. Memory and ability to understand (cognition). Work and work Statistician. Reproductive health. Screening  You may have the following tests or measurements: Height, weight, and BMI. Blood pressure. Lipid and cholesterol levels. These may be checked every 5 years, or more frequently if you are over 9 years old. Skin check. Lung cancer screening. You may have this screening every year starting at age 72 if you have a 30-pack-year history of smoking and currently smoke or have quit within the past 15 years. Fecal occult blood test (FOBT) of the stool. You may have this test every year starting at age 72. Flexible sigmoidoscopy or colonoscopy. You may have a sigmoidoscopy every 5 years or a colonoscopy every 10 years starting at age 56. Hepatitis C blood test. Hepatitis B blood test. Sexually transmitted disease (STD) testing. Diabetes screening. This is done by checking your blood sugar (glucose) after you have not eaten for a while (fasting). You may have this done every 1-3 years. Bone density scan. This is done to screen for osteoporosis. You may have this done starting at age 72. Mammogram. This may be done every 1-2 years. Talk to your health care provider about how often you should have regular mammograms. Talk with your health care provider about your test results, treatment options, and if necessary, the need for more tests. Vaccines  Your health care provider may recommend certain vaccines, such as: Influenza  vaccine. This is recommended every year. Tetanus, diphtheria, and  acellular pertussis (Tdap, Td) vaccine. You may need a Td booster every 10 years. Zoster vaccine. You may need this after age 72. Pneumococcal 13-valent conjugate (PCV13) vaccine. One dose is recommended after age 72. Pneumococcal polysaccharide (PPSV23) vaccine. One dose is recommended after age 72. Talk to your health care provider about which screenings and vaccines you need and how often you need them. This information is not intended to replace advice given to you by your health care provider. Make sure you discuss any questions you have with your health care provider. Document Released: 11/04/2015 Document Revised: 06/27/2016 Document Reviewed: 08/09/2015 Elsevier Interactive Patient Education  2017 [employer] Prevention in the Home Falls can cause injuries. They can happen to people of all ages. There are many things you can do to make your home safe and to help prevent falls. What can I do on the outside of my home? Regularly fix the edges of walkways and driveways and fix any cracks. Remove anything that might make you trip as you walk through a door, such as a raised step or threshold. Trim any bushes or trees on the path to your home. Use bright outdoor lighting. Clear any walking paths of anything that might make someone trip, such as rocks or tools. Regularly check to see if handrails are loose or broken. Make sure that both sides of any steps have handrails. Any raised decks and porches should have guardrails on the edges. Have any leaves, snow, or ice cleared regularly. Use sand or salt on walking paths during winter. Clean up any spills in your garage right away. This includes oil or grease spills. What can I do in the bathroom? Use night lights. Install grab bars by the toilet and in the tub and shower. Do not use towel bars as grab bars. Use non-skid mats or decals in the tub or shower. If you need to sit down in the shower, use a plastic, non-slip stool. Keep  the floor dry. Clean up any water that spills on the floor as soon as it happens. Remove soap buildup in the tub or shower regularly. Attach bath mats securely with double-sided non-slip rug tape. Do not have throw rugs and other things on the floor that can make you trip. What can I do in the bedroom? Use night lights. Make sure that you have a light by your bed that is easy to reach. Do not use any sheets or blankets that are too big for your bed. They should not hang down onto the floor. Have a firm chair that has side arms. You can use this for support while you get dressed. Do not have throw rugs and other things on the floor that can make you trip. What can I do in the kitchen? Clean up any spills right away. Avoid walking on wet floors. Keep items that you use a lot in easy-to-reach places. If you need to reach something above you, use a strong step stool that has a grab bar. Keep electrical cords out of the way. Do not use floor polish or wax that makes floors slippery. If you must use wax, use non-skid floor wax. Do not have throw rugs and other things on the floor that can make you trip. What can I do with my stairs? Do not leave any items on the stairs. Make sure that there are handrails on both sides of the stairs and use them. Fix  handrails that are broken or loose. Make sure that handrails are as long as the stairways. Check any carpeting to make sure that it is firmly attached to the stairs. Fix any carpet that is loose or worn. Avoid having throw rugs at the top or bottom of the stairs. If you do have throw rugs, attach them to the floor with carpet tape. Make sure that you have a light switch at the top of the stairs and the bottom of the stairs. If you do not have them, ask someone to add them for you. What else can I do to help prevent falls? Wear shoes that: Do not have high heels. Have rubber bottoms. Are comfortable and fit you well. Are closed at the toe. Do not  wear sandals. If you use a stepladder: Make sure that it is fully opened. Do not climb a closed stepladder. Make sure that both sides of the stepladder are locked into place. Ask someone to hold it for you, if possible. Clearly mark and make sure that you can see: Any grab bars or handrails. First and last steps. Where the edge of each step is. Use tools that help you move around (mobility aids) if they are needed. These include: Canes. Walkers. Scooters. Crutches. Turn on the lights when you go into a dark area. Replace any light bulbs as soon as they burn out. Set up your furniture so you have a clear path. Avoid moving your furniture around. If any of your floors are uneven, fix them. If there are any pets around you, be aware of where they are. Review your medicines with your doctor. Some medicines can make you feel dizzy. This can increase your chance of falling. Ask your doctor what other things that you can do to help prevent falls. This information is not intended to replace advice given to you by your health care provider. Make sure you discuss any questions you have with your health care provider. Document Released: 08/04/2009 Document Revised: 03/15/2016 Document Reviewed: 11/12/2014 Elsevier Interactive Patient Education  2017 [employer].

## 2022-03-12 NOTE — Progress Notes (Signed)
I connected with Larey Dresser today by telephone and verified that I am speaking with the correct person using two identifiers. Location patient: home Location provider: work Persons participating in the virtual visit: patient, provider.   I discussed the limitations, risks, security and privacy concerns of performing an evaluation and management service by telephone and the availability of in person appointments. I also discussed with the patient that there may be a patient responsible charge related to this service. The patient expressed understanding and verbally consented to this telephonic visit.    Interactive audio and video telecommunications were attempted between this provider and patient, however failed, due to patient having technical difficulties OR patient did not have access to video capability.  We continued and completed visit with audio only.  Some vital signs may be absent or patient reported.   Time Spent with patient on telephone encounter: 30 minutes  Subjective:   Jessica [name] is a 71 y.o. female who presents for Medicare Annual (Subsequent) preventive examination.  Review of Systems     Cardiac Risk Factors include: advanced age (>26mn, >>5women);dyslipidemia;family history of premature cardiovascular disease     Objective:    Today's Vitals   03/12/22 1451  Weight: 164 lb (74.4 kg)  Height: '5\' 6"'$  (1.676 m)   Body mass index is 26.47 kg/m.     03/12/2022    2:35 PM 11/29/2021    7:22 AM 10/03/2021    8:25 AM 01/02/2021    2:59 PM 08/25/2020    4:23 PM 02/18/2020    5:58 AM 12/28/2019    2:09 PM  Advanced Directives  Does Patient Have a Medical Advance Directive? No No Yes No No No No  Type of AComptrollerLiving will      Does patient want to make changes to medical advance directive?   No - Patient declined      Copy of [place] Chart?   No - copy requested      Would patient like  information on creating a medical advance directive? No - Patient declined    No - Patient declined No - Patient declined No - Patient declined    Current Medications (verified) Outpatient Encounter Medications as of 03/12/2022  Medication Sig   Ascorbic Acid (VITAMIN C) 1000 MG tablet Take 2,000 mg by mouth daily.   aspirin EC 81 MG tablet Take 1 tablet (81 mg total) by mouth daily. Swallow whole.   Cholecalciferol (VITAMIN D-3) 125 MCG (5000 UT) TABS Take 5,000 Units by mouth daily.   folic acid (FOLVITE) 1 MG tablet Take 1 mg by mouth daily.   hydroxychloroquine (PLAQUENIL) 200 MG tablet Take 200 mg by mouth 2 (two) times daily.   loratadine (CLARITIN) 10 MG tablet Take 10 mg by mouth every evening.    magnesium 30 MG tablet Take 30 mg by mouth daily.   methotrexate (RHEUMATREX) 2.5 MG tablet Take 10 mg by mouth once a week.   mometasone (ELOCON) 0.1 % cream Apply 1 application topically daily as needed (Rash).   Multiple Vitamin (MULTIVITAMIN WITH MINERALS) TABS tablet Take 1 tablet by mouth daily.   predniSONE (STERAPRED UNI-PAK 21 TAB) 10 MG (21) TBPK tablet Take by mouth daily.   No facility-administered encounter medications on file as of 03/12/2022.    Allergies (verified) Codeine, Repatha [evolocumab], Sulfa antibiotics, and Sulfacetamide   History: Past Medical History:  Diagnosis Date   Allergy  Breast cancer ([place]) 2000   Cancer ([place])    breast   Colon polyp    Coronary artery disease    GERD (gastroesophageal reflux disease)    History of blood transfusion    Hyperlipidemia    Migraine    history of migrarines, none as an adult   Personal history of radiation therapy 2000   Past Surgical History:  Procedure Laterality Date   ABDOMINAL HYSTERECTOMY     BREAST LUMPECTOMY Right 2000   BREAST SURGERY Right    calcifaction removed   BUNIONECTOMY Bilateral    COLONOSCOPY W/ POLYPECTOMY     CORONARY ARTERY BYPASS GRAFT N/A 01/05/2019   Procedure: CORONARY ARTERY  BYPASS GRAFTING (CABG), using right leg saphenous endoscopic and open vein harvest, exploration left leg;  Surgeon: Gaye Pollack, MD;  Location: [facility];  Service: Open Heart Surgery;  Laterality: N/A;   EYE SURGERY Bilateral    cataract   hemorrhoid     LEFT HEART CATH AND CORONARY ANGIOGRAPHY N/A 01/01/2019   Procedure: LEFT HEART CATH AND CORONARY ANGIOGRAPHY;  Surgeon: Jettie Booze, MD;  Location: [facility];  Service: Cardiovascular;  Laterality: N/A;   STERNAL WIRES REMOVAL N/A 05/11/2019   Procedure: STERNAL WIRES REMOVAL;  Surgeon: Gaye Pollack, MD;  Location: [facility];  Service: Thoracic;  Laterality: N/A;   TEE WITHOUT CARDIOVERSION N/A 01/05/2019   Procedure: TRANSESOPHAGEAL ECHOCARDIOGRAM (TEE);  Surgeon: Gaye Pollack, MD;  Location: [facility];  Service: Open Heart Surgery;  Laterality: N/A;   Family History  Problem Relation Age of Onset   Heart disease Brother    Asthma Paternal Aunt    Diabetes Paternal Aunt    Stroke Paternal Uncle    Kidney disease Paternal Uncle    Heart attack Father 17   Social History   Socioeconomic History   Marital status: Married    Spouse name: Not on file   Number of children: Not on file   Years of education: Not on file   Highest education level: Master's degree (e.g., MA, MS, MEng, MEd, MSW, MBA)  Occupational History   Not on file  Tobacco Use   Smoking status: Never   Smokeless tobacco: Never  Vaping Use   Vaping Use: Never used  Substance and Sexual Activity   Alcohol use: No   Drug use: No   Sexual activity: Yes  Other Topics Concern   Not on file  Social History Narrative   Not on file   Social Determinants of Health   Financial Resource Strain: Low Risk    Difficulty of Paying Living Expenses: Not hard at all  Food Insecurity: No Food Insecurity   Worried About Charity fundraiser in the Last Year: Never true   [place] in the Last Year: Never true  Transportation Needs: No Transportation Needs    Lack of Transportation (Medical): No   Lack of Transportation (Non-Medical): No  Physical Activity: Sufficiently Active   Days of Exercise per Week: 5 days   Minutes of Exercise per Session: 30 min  Stress: No Stress Concern Present   Feeling of Stress : Not at all  Social Connections: Socially Integrated   Frequency of Communication with Friends and Family: More than three times a week   Frequency of Social Gatherings with Friends and Family: More than three times a week   Attends Religious Services: More than 4 times per year   Active Member of [employer] or Organizations:  Yes   Attends Archivist Meetings: More than 4 times per year   Marital Status: Married    Tobacco Counseling Counseling given: Not Answered   Clinical Intake:  Pre-visit preparation completed: Yes  Pain : No/denies pain     BMI - recorded: 26.9 Nutritional Status: BMI 25 -29 Overweight Nutritional Risks: None Diabetes: No  How often do you need to have someone help you when you read instructions, pamphlets, or other written materials from your doctor or pharmacy?: 1 - Never What is the last grade level you completed in school?: 1 Bachelor's Degree and 2 Master Degrees  Diabetic? no  Interpreter Needed?: No  Information entered by :: Lisette Abu, LPN   Activities of Daily Living    03/12/2022    2:43 PM  In your present state of health, do you have any difficulty performing the following activities:  Hearing? 0  Vision? 0  Difficulty concentrating or making decisions? 0  Walking or climbing stairs? 0  Dressing or bathing? 0  Doing errands, shopping? 0  Preparing Food and eating ? N  Using the Toilet? N  In the past six months, have you accidently leaked urine? N  Do you have problems with loss of bowel control? N  Managing your Medications? N  Managing your Finances? N  Housekeeping or managing your Housekeeping? N    Patient Care Team: Hoyt Koch, MD as PCP -  General (Internal Medicine) Lorretta Harp, MD as PCP - Cardiology (Cardiology)  Indicate any recent Medical Services you may have received from other than [facility] providers in the past year (date may be approximate).     Assessment:   This is a routine wellness examination for [place].  Hearing/Vision screen Hearing Screening - Comments:: Patient denied any hearing difficulty.   No hearing aids.  Vision Screening - Comments:: Patient does wear corrective lenses/contacts.  [facility] exam done by: [facility]   Dietary issues and exercise activities discussed: Current Exercise Habits: Home exercise routine;Structured exercise class, Type of exercise: treadmill;stretching;strength training/weights;walking, Time (Minutes): 30, Frequency (Times/Week): 5, Weekly Exercise (Minutes/Week): 150, Intensity: Moderate, Exercise limited by: Other - see comments (Rheumatoid Arthritis)   Goals Addressed             This Visit's Progress    I have changed my diet by eating more salmon and skinless chicken.  I have stopped drinking tea and sodas.  I am in the gym and walking in my neighborhood.        Depression Screen    03/12/2022    2:42 PM 03/06/2022   10:14 AM 10/30/2021    3:25 PM 09/21/2020    9:09 AM 01/04/2020   12:51 PM 05/18/2019   12:22 PM 12/31/2018    8:01 AM  PHQ 2/9 Scores  PHQ - 2 Score 0 0 0 0 0 1 0  PHQ- 9 Score 0 '1   4 2     '$ Fall Risk    03/12/2022    2:36 PM 03/06/2022   10:14 AM 10/30/2021    3:25 PM 09/21/2020    9:09 AM 12/28/2019    2:08 PM  Fall Risk   Falls in the past year? 0 0 0 0 0  Number falls in past yr: 0 0 0 0 0  Injury with Fall? 0 0 0  0  Risk for fall due to : No Fall Risks    No Fall Risks  Follow up Falls evaluation completed  Falls evaluation completed Falls evaluation completed;Education provided;Falls prevention discussed    FALL RISK PREVENTION PERTAINING TO THE HOME:  Any stairs in or around the home? Yes  If so, are there any without  handrails? No  Home free of loose throw rugs in walkways, pet beds, electrical cords, etc? Yes  Adequate lighting in your home to reduce risk of falls? Yes   ASSISTIVE DEVICES UTILIZED TO PREVENT FALLS:  Life alert? No  Use of a cane, walker or w/c? No  Grab bars in the bathroom? No  Shower chair or bench in shower? Yes  Elevated toilet seat or a handicapped toilet? No   TIMED UP AND GO:  Was the test performed? No .  Length of time to ambulate 10 feet: n/a sec.   Appearance of gait: Patient not evaluated for gait during this visit.  Cognitive Function:        03/12/2022    2:49 PM  6CIT Screen  What Year? 0 points  What month? 0 points  What time? 0 points  Count back from 20 0 points  Months in reverse 0 points  Repeat phrase 0 points  Total Score 0 points    Immunizations Immunization History  Administered Date(s) Administered   Fluad Quad(high Dose 65+) 06/16/2019, 08/08/2020, 07/14/2021   Influenza, High Dose Seasonal PF 07/27/2016, 06/27/2017, 09/17/2018   Influenza, Seasonal, Injecte, Preservative Fre 08/09/2014   Influenza,inj,Quad PF,6+ Mos 08/17/2015   Influenza-Unspecified 07/27/2016   Moderna Sars-Covid-2 Vaccination 01/02/2020, 02/02/2020, 08/18/2020   Pneumococcal Conjugate-13 07/27/2016   Pneumococcal Polysaccharide-23 09/16/2017   Tdap 02/03/2005, 10/22/2006, 10/02/2016   Zoster Recombinat (Shingrix) 10/25/2020, 12/22/2020    TDAP status: Up to date  Flu Vaccine status: Up to date  Pneumococcal vaccine status: Up to date  Covid-19 vaccine status: Completed vaccines  Qualifies for Shingles Vaccine? Yes   Zostavax completed No   Shingrix Completed?: Yes  Screening Tests Health Maintenance  Topic Date Due   COVID-19 Vaccine (4 - Booster for Moderna series) 10/13/2020   INFLUENZA VACCINE  05/22/2022   MAMMOGRAM  11/16/2023   COLONOSCOPY (Pts 45-65yr Insurance coverage will need to be confirmed)  02/04/2024   TETANUS/TDAP  10/02/2026    Pneumonia Vaccine 72 Years old  Completed   DEXA SCAN  Completed   Hepatitis C Screening  Completed   Zoster Vaccines- Shingrix  Completed   HPV VACCINES  Aged Out    Health Maintenance  Health Maintenance Due  Topic Date Due   COVID-19 Vaccine (4 - Booster for Moderna series) 10/13/2020    Colorectal cancer screening: Type of screening: Colonoscopy. Completed 02/03/2014. Repeat every 10 years  Mammogram status: Completed 11/15/2021. Repeat every year  Bone Density status: Completed 09/23/2020. Results reflect: Bone density results: OSTEOPENIA. Repeat every 2 years.  Lung Cancer Screening: (Low Dose CT Chest recommended if Age 72-80years, 30 pack-year currently smoking OR have quit w/in 15years.) does not qualify.   Lung Cancer Screening Referral: no  Additional Screening:  Hepatitis C Screening: does qualify; Completed 10/02/2016  Vision Screening: Recommended annual ophthalmology exams for early detection of glaucoma and other disorders of the eye. Is the patient up to date with their annual eye exam?  Yes  Who is the provider or what is the name of the [facility] which the patient [facility]ends annual [facility] exams[facility] not [facility] with a provider, would they like to be referred to a provider to establish care? No .   Dental Screening:  Recommended annual dental exams for proper oral hygiene  Community Resource Referral / Chronic Care Management: CRR required this visit?  No   CCM required this visit?  No      Plan:     I have personally reviewed and noted the following in the patient's chart:   Medical and social history Use of alcohol, tobacco or illicit drugs  Current medications and supplements including opioid prescriptions.  Functional ability and status Nutritional status Physical activity Advanced directives List of other physicians Hospitalizations, surgeries, and ER visits in previous 12 months Vitals Screenings to include cognitive,  depression, and falls Referrals and appointments  In addition, I have reviewed and discussed with patient certain preventive protocols, quality metrics, and best practice recommendations. A written personalized care plan for preventive services as well as general preventive health recommendations were provided to patient.     Sheral Flow, LPN   [id number]   Nurse Notes:  Patient provided weight and height for this visit. There were no vitals filed for this visit. Patient stated that she has no issues with gait or balance; does not use any assistive devices. Medications reviewed with patient; no opioid use noted.

## 2022-03-15 ENCOUNTER — Ambulatory Visit: Payer: Medicare PPO | Admitting: Occupational Therapy

## 2022-03-15 DIAGNOSIS — M25642 Stiffness of left hand, not elsewhere classified: Secondary | ICD-10-CM | POA: Diagnosis not present

## 2022-03-15 DIAGNOSIS — M79641 Pain in right hand: Secondary | ICD-10-CM

## 2022-03-15 DIAGNOSIS — M25641 Stiffness of right hand, not elsewhere classified: Secondary | ICD-10-CM | POA: Diagnosis not present

## 2022-03-15 DIAGNOSIS — G5601 Carpal tunnel syndrome, right upper limb: Secondary | ICD-10-CM

## 2022-03-15 DIAGNOSIS — M79642 Pain in left hand: Secondary | ICD-10-CM | POA: Diagnosis not present

## 2022-03-15 DIAGNOSIS — M6281 Muscle weakness (generalized): Secondary | ICD-10-CM

## 2022-03-15 NOTE — Therapy (Signed)
[facility] [address] [place], [address] Phone: [phone]   Fax:  [phone]  Occupational Therapy Treatment  Patient Details  Name: Jessica [name] MRN: [id number] Date of Birth: May 18, 1951 Referring Provider (OT): Louanne Skye   Encounter Date: 03/15/2022   OT End of Session - 03/15/22 0900     Visit Number 2    Number of Visits 6    Date for OT Re-Evaluation 04/20/22    OT Start Time 0900    OT Stop Time 0941    OT Time Calculation (min) 41 min    Activity Tolerance Patient tolerated treatment well    Behavior During Therapy [facility] for tasks assessed/performed             Past Medical History:  Diagnosis Date   Allergy    Breast cancer ([place]) 2000   Cancer ([place])    breast   Colon polyp    Coronary artery disease    GERD (gastroesophageal reflux disease)    History of blood transfusion    Hyperlipidemia    Migraine    history of migrarines, none as an adult   Personal history of radiation therapy 2000    Past Surgical History:  Procedure Laterality Date   ABDOMINAL HYSTERECTOMY     BREAST LUMPECTOMY Right 2000   BREAST SURGERY Right    calcifaction removed   BUNIONECTOMY Bilateral    COLONOSCOPY W/ POLYPECTOMY     CORONARY ARTERY BYPASS GRAFT N/A 01/05/2019   Procedure: CORONARY ARTERY BYPASS GRAFTING (CABG), using right leg saphenous endoscopic and open vein harvest, exploration left leg;  Surgeon: Gaye Pollack, MD;  Location: [place];  Service: Open Heart Surgery;  Laterality: N/A;   EYE SURGERY Bilateral    cataract   hemorrhoid     LEFT HEART CATH AND CORONARY ANGIOGRAPHY N/A 01/01/2019   Procedure: LEFT HEART CATH AND CORONARY ANGIOGRAPHY;  Surgeon: Jettie Booze, MD;  Location: [facility];  Service: Cardiovascular;  Laterality: N/A;   STERNAL WIRES REMOVAL N/A 05/11/2019   Procedure: STERNAL WIRES REMOVAL;  Surgeon: Gaye Pollack, MD;  Location: [facility];   Service: Thoracic;  Laterality: N/A;   TEE WITHOUT CARDIOVERSION N/A 01/05/2019   Procedure: TRANSESOPHAGEAL ECHOCARDIOGRAM (TEE);  Surgeon: Gaye Pollack, MD;  Location: [place];  Service: Open Heart Surgery;  Laterality: N/A;    There were no vitals filed for this visit.   Subjective Assessment - 03/15/22 0900     Subjective  I am done with my steriod - and had shot - My R hand feels great but L thumb still bothers me.  You need to show me again how to put this black soft splint on, I do not think I do it right.  Day send me the wrong size for my  right hand.  And I could not find Isotoner gloves.    Pertinent History Patient with increased pain since February 23 and bilateral hands with increased inflammation as well as swelling pain and numbness.  Patient report 10/10 pain in bilateral hands.  Did see since then Dr. Posey Pronto the rheumatologist as well as orthopedist on 03/05/22 and referred to this OT with bilateral hand rheumatoid arthritis and carpal tunnel symptoms in the right hand.  Checking ESR and CRP to assess inflammatory state. She is having new numbness and pain in her hands. The numbness is significantly improved s/p injection yesterday by Dr. Louanne Skye so we will  wait to adjust medications for 1 week to see if this shot will help mre.  Patient on prednisone to will be finishing that up in the next few days.    Patient Stated Goals I want you to help me to make sure that my hands does not get worse again I want to strengthen the mobility so I can do the things that I like to do.    Currently in Pain? Yes    Pain Score 3     Pain Location Hand    Pain Orientation Left    Pain Descriptors / Indicators Aching;Tender    Pain Type Chronic pain    Pain Onset More than a month ago    Pain Frequency Intermittent                [facility] OT Assessment - 03/15/22 0001       Strength   Right Hand Grip (lbs) 70    Right Hand Lateral Pinch 18 lbs    Right Hand 3 Point Pinch 11 lbs    Left  Hand Grip (lbs) 40    Left Hand Lateral Pinch 12 lbs    Left Hand 3 Point Pinch 9 lbs      Right Hand AROM   R Thumb Opposition to Index --   Hyper ext IP , flexion MC and ADD of CMC   R Index  MCP 0-90 85 Degrees    R Index PIP 0-100 100 Degrees    R Long  MCP 0-90 90 Degrees    R Long PIP 0-100 100 Degrees    R Ring  MCP 0-90 90 Degrees    R Ring PIP 0-100 100 Degrees    R Little  MCP 0-90 90 Degrees    R Little PIP 0-100 95 Degrees      Left Hand AROM   L Index  MCP 0-90 90 Degrees    L Index PIP 0-100 0 Degrees    L Long  MCP 0-90 90 Degrees    L Long PIP 0-100 100 Degrees    L Ring  MCP 0-90 90 Degrees    L Ring PIP 0-100 100 Degrees    L Little  MCP 0-90 90 Degrees    L Little PIP 0-100 95 Degrees                Bilateral hands active range of motion increased to within normal limits same as February 2022.  Patient with less pain in the right hand and grip strength improved greatly compared to last week.  Patient since then stopped prednisone.  Patient reports mostly pain in the CMC's thumbs left worse than right.        OT Treatments/Exercises (OP) - 03/15/22 0001       RUE Contrast Bath   Time 8 minutes    Comments prior to soft tissue nad HEP review      LUE Contrast Bath   Time 8 minutes    Comments contrast prior to soft tissue and ROM            Reviewed with patient again home program for contrast 2 times a day morning for stiffness and pain in the evenings to calm things down. Fitted patient with Isotoner gloves to use at nighttime for swelling and pain. CMC neoprene splints for bilateral hands during the day with activities to prevent abduction of right CMC into carpal tunnel, can wear on the left 2 to decrease  thumb CMC pain.  Reviewed tendon glides-patient had a hard time with lumbrical fist with MCP flexion to 90-encourage patient to block proximal to distal palmar crease with better success 10 reps.   Followed by intrinsic a fist and then  composite fist.   opposition 10 reps 2 times a day very light to do not forceful or squeeze. Add to home program palmar and radial abduction of the thumbs pain-free 10 reps  Patient needs reviewing of donning correctly CMC neoprene splints to prevent being too tight or causing pain and discomfort.  Review again with patient joint protection and home modifications in ADL's and IADL"S using larger joints as well as building up handles or grips as well as respect for pain if pain lingers for 2 hours after activity over the next day increased pain.  Look back what she done 12 to 24 hours before.  And modify those.          OT Education - 03/15/22 0900     Education Details progress and HEP    Person(s) Educated Patient    Methods Explanation;Demonstration;Tactile cues;Verbal cues;Handout    Comprehension Verbal cues required;Returned demonstration;Verbalized understanding                 OT Long Term Goals - 03/09/22 1347       OT LONG TERM GOAL #1   Title Patient to be independent in home program to increase active range of motion to within normal limits with decreased pain and edema in bilateral hands without being on prednisone    Baseline Patient on prednisone since Monday.  Continue to show some increase edema in bilateral hands and some MCP and PIP flexion decreased by 5 degrees, grip and prehension decreased compared to a year ago.    Time 3    Period Weeks    Status New    Target Date 03/30/22      OT LONG TERM GOAL #2   Title Patient reports 3 joint protection principles or adaptive equipment to increase ease or use of hands and ADLs    Baseline Patient needs reinforcement and reeducation    Time 6    Period Weeks    Target Date 04/20/22      OT LONG TERM GOAL #3   Title Patient's grip and prehension strength increase without pain to about the same then she was February of last year    Baseline Grip strength on the right 52, left 41.  Lateral pinch right 18 left  14 pounds.  Three-point pinch 11 pounds in the right left 9 pounds still in range for her age but significantly decreased compared to last year.    Time 6    Period Weeks    Status New    Target Date 04/20/22      OT LONG TERM GOAL #4   Title Patient to verbalize several resources in the community where she can do exercise to maintain her flexibility mobility and strength    Baseline Provided patient this date information on arthritis lending series as well as exercise plus tai chi classes at senior center in community    Time 4    Period Weeks    Status New    Target Date 04/06/22                   Plan - 03/15/22 0901     Clinical Impression Statement Patient referred to OT with diagnosis of bilateral rheumatoid arthritis as well  as carpal tunnel symptoms on the right.  Patient was evaluated earlier this week.  Patient had a shot in the right carpal tunnel 03/05/2022 by orthopedist as well as on prednisone she finished up this past week Patient report decreased pain from a 10/10 last week to 0 out of 10 pain in the right and 3/10 in the left thumb mostly [place].  Patient arrived this date compared to evaluation date with active range of motion in bilateral digits within normal limits again at bilateral thumb [facility] bothering the most with the left worse than the right.  Patient done home program thus far and had her stop doing any putty or resistance that she was doing a few months ago.  Reinforced with patient again the importance of decreasing  edema/pain and stiffness with increasing or maintaining her motion and grip strength will increase without grip strengthening.  Patient grip strength increase greatly since evaluation.  Focus on decreasing inflammation by doing contrast, Isotoner gloves at night and joint protection and modifications.  Patient also to use CMC neoprene splint especially on the right to support thumb CMC out of carpal tunnel during prehension pinch and grip.   Patient  reports she changed her diet to [place] provided patient with information of arthritis lending series that is done on a monthly basis at the [facility].  Also provided patient information on exercise classes and tai chi classes at [facility].  Patient very interested in this information patient very motivated to follow new home program and will follow-up with OT next week.  Patient limited in use of bilateral hands and ADLs and IADLs by pain, swelling and inflammation.  Patient can benefit from skilled OT services to increase independence in use of bilateral hands.    OT Occupational Profile and History Problem Focused Assessment - Including review of records relating to presenting problem    Occupational performance deficits (Please refer to evaluation for details): ADL's;IADL's;Rest and Sleep;Play;Leisure;Social Participation    Body Structure / Function / Physical Skills ADL;Decreased knowledge of use of DME;Strength;Pain;Edema;UE functional use;IADL;ROM;Flexibility    Rehab Potential Good    Clinical Decision Making Several treatment options, min-mod task modification necessary    Comorbidities Affecting Occupational Performance: May have comorbidities impacting occupational performance    Modification or Assistance to Complete Evaluation  No modification of tasks or assist necessary to complete eval    OT Frequency 1x / week    OT Duration 6 weeks    OT Treatment/Interventions Self-care/ADL training;Moist Heat;DME and/or AE instruction;Splinting;Contrast Bath;Therapeutic exercise;Paraffin;Patient/family education;Manual Therapy    Consulted and Agree with Plan of Care Patient             Patient will benefit from skilled therapeutic intervention in order to improve the following deficits and impairments:   Body Structure / Function / Physical Skills: ADL, Decreased knowledge of use of DME, Strength, Pain, Edema, UE functional use, IADL, ROM, Flexibility        Visit Diagnosis: Pain in right hand  Pain in left hand  Stiffness of right hand, not elsewhere classified  Stiffness of left hand, not elsewhere classified  Carpal tunnel syndrome, right upper limb  Muscle weakness (generalized)    Problem List Patient Active Problem List   Diagnosis Date Noted   Rheumatoid arthritis involving multiple sites ([place]) 03/08/2022   Insomnia 03/08/2022   Dysphagia 10/31/2021   Lymphadenopathy of head and neck 06/16/2021   Long-term use of immunosuppressant medication 05/31/2021   Eye drainage 03/17/2021  Fatigue 01/27/2021   Chronic pain of both shoulders 06/01/2020   Swelling of both hands 06/01/2020   Cervical disc disorder with radiculopathy of cervical region 05/24/2020   Post inflammatory hypopigmentation 12/30/2019   Claudication in peripheral vascular disease ([place]) 12/22/2019   Arthritis of carpometacarpal (CMC) joint of left thumb 10/28/2019   Hand arthritis 10/01/2019   Statin myopathy 08/30/2019   SOB (shortness of breath) on exertion 06/19/2019   Hyperlipidemia 04/08/2019   S/P CABG x 5 01/05/2019   Left leg pain 09/30/2018   Degenerative joint disease of low back 01/09/2017   Routine general medical examination at a health care facility 10/02/2016   GERD (gastroesophageal reflux disease) 07/28/2016   H/O malignant neoplasm of breast 06/19/2016    Rosalyn Gess, OTR/L,CLT 03/15/2022, 10:55 AM  [facility] [address] [place], [address] Phone: [phone]   Fax:  [phone]  Name: Jeorgia Helming MRN: [id number] Date of Birth: 10/10/1951

## 2022-03-22 ENCOUNTER — Encounter: Payer: Medicare PPO | Admitting: Occupational Therapy

## 2022-03-23 ENCOUNTER — Encounter: Payer: Medicare PPO | Admitting: Occupational Therapy

## 2022-03-26 ENCOUNTER — Ambulatory Visit: Payer: Medicare PPO | Attending: Orthopedic Surgery | Admitting: Occupational Therapy

## 2022-03-26 DIAGNOSIS — M25642 Stiffness of left hand, not elsewhere classified: Secondary | ICD-10-CM | POA: Diagnosis not present

## 2022-03-26 DIAGNOSIS — M25641 Stiffness of right hand, not elsewhere classified: Secondary | ICD-10-CM

## 2022-03-26 DIAGNOSIS — M6281 Muscle weakness (generalized): Secondary | ICD-10-CM | POA: Diagnosis not present

## 2022-03-26 DIAGNOSIS — M79642 Pain in left hand: Secondary | ICD-10-CM

## 2022-03-26 DIAGNOSIS — G5601 Carpal tunnel syndrome, right upper limb: Secondary | ICD-10-CM | POA: Diagnosis not present

## 2022-03-26 DIAGNOSIS — M79641 Pain in right hand: Secondary | ICD-10-CM | POA: Diagnosis not present

## 2022-03-26 NOTE — Therapy (Signed)
[facility] [address] [place], [address] Phone: [phone]   Fax:  [phone]  Occupational Therapy Treatment  Patient Details  Name: Jessica [name] MRN: [id number] Date of Birth: 03-Jul-1951 Referring Provider (OT): Louanne Skye   Encounter Date: 03/26/2022   OT End of Session - 03/26/22 0924     Visit Number 3    Number of Visits 6    Date for OT Re-Evaluation 04/20/22    OT Start Time 0905    OT Stop Time 0945    OT Time Calculation (min) 40 min    Activity Tolerance Patient tolerated treatment well    Behavior During Therapy [facility] for tasks assessed/performed             Past Medical History:  Diagnosis Date   Allergy    Breast cancer ([place]) 2000   Cancer ([place])    breast   Colon polyp    Coronary artery disease    GERD (gastroesophageal reflux disease)    History of blood transfusion    Hyperlipidemia    Migraine    history of migrarines, none as an adult   Personal history of radiation therapy 2000    Past Surgical History:  Procedure Laterality Date   ABDOMINAL HYSTERECTOMY     BREAST LUMPECTOMY Right 2000   BREAST SURGERY Right    calcifaction removed   BUNIONECTOMY Bilateral    COLONOSCOPY W/ POLYPECTOMY     CORONARY ARTERY BYPASS GRAFT N/A 01/05/2019   Procedure: CORONARY ARTERY BYPASS GRAFTING (CABG), using right leg saphenous endoscopic and open vein harvest, exploration left leg;  Surgeon: Gaye Pollack, MD;  Location: [facility];  Service: Open Heart Surgery;  Laterality: N/A;   EYE SURGERY Bilateral    cataract   hemorrhoid     LEFT HEART CATH AND CORONARY ANGIOGRAPHY N/A 01/01/2019   Procedure: LEFT HEART CATH AND CORONARY ANGIOGRAPHY;  Surgeon: Jettie Booze, MD;  Location: [facility];  Service: Cardiovascular;  Laterality: N/A;   STERNAL WIRES REMOVAL N/A 05/11/2019   Procedure: STERNAL WIRES REMOVAL;  Surgeon: Gaye Pollack, MD;  Location: [facility];   Service: Thoracic;  Laterality: N/A;   TEE WITHOUT CARDIOVERSION N/A 01/05/2019   Procedure: TRANSESOPHAGEAL ECHOCARDIOGRAM (TEE);  Surgeon: Gaye Pollack, MD;  Location: [facility];  Service: Open Heart Surgery;  Laterality: N/A;    There were no vitals filed for this visit.   Subjective Assessment - 03/26/22 0919     Subjective  They are sore today -I had church conference and it was long , could not do my HEP and write a lot as well as holding papers and books    Pertinent History Patient with increased pain since February 23 and bilateral hands with increased inflammation as well as swelling pain and numbness.  Patient report 10/10 pain in bilateral hands.  Did see since then Dr. Posey Pronto the rheumatologist as well as orthopedist on 03/05/22 and referred to this OT with bilateral hand rheumatoid arthritis and carpal tunnel symptoms in the right hand.  Checking ESR and CRP to assess inflammatory state. She is having new numbness and pain in her hands. The numbness is significantly improved s/p injection yesterday by Dr. Louanne Skye so we will wait to adjust medications for 1 week to see if this shot will help mre.  Patient on prednisone to will be finishing that up in the next few days.    Patient Stated Goals  I want you to help me to make sure that my hands does not get worse again I want to strengthen the mobility so I can do the things that I like to do.    Currently in Pain? Yes    Pain Score 5     Pain Location Hand    Pain Orientation Right;Left    Pain Descriptors / Indicators Aching;Sore    Pain Type Chronic pain    Pain Frequency Intermittent                       Patient arrived this date with bilateral CMC neoprene splint on patient reports that bilateral thumbs to feeling great but she has since this weekend increased discomfort and pain over right metacarpals more than left.  Upon assessment by OT figure out patient was at church in a conference since Wednesday and was handling  books and writing materials a lot-writing a lot since Wednesday.  We will explain increased discomfort to the right metacarpals movement the left.     OT Treatments/Exercises (OP) - 03/26/22 0001       RUE Contrast Bath   Time 8 minutes    Comments Prior to soft tissue to decrease stiffness and pain      LUE Contrast Bath   Time 8 minutes    Comments Prior to soft tissue to decrease pain and stiffness              Reviewed with patient again home program for contrast 2 times a day morning for stiffness and pain in the evenings to calm things down. Fitted patient last time with Isotoner gloves to use at nighttime for swelling and pain. CMC neoprene splints for bilateral hands during the day with activities to prevent abduction of right CMC into carpal tunnel, can wear on the left 2 to decrease thumb CMC pain.   Reviewed tendon glides-patient had a hard time with lumbrical fist with MCP flexion to 90-encourage patient to block proximal to distal palmar crease with better success 10 reps.   Followed by intrinsic a fist and then composite fist.   opposition 10 reps 2 times a day very light to do not forceful or squeeze. Done palmar and radial abduction of the thumbs pain-free 10 reps   Patient needs again reviewing of donning correctly CMC neoprene splints to prevent being too tight or causing pain and discomfort.   Review again with patient joint protection and home modifications in ADL's and IADL"S using larger joints as well as building up handles or grips as well as respect for pain if pain lingers for 2 hours after activity over the next day increased pain.  Look back what she done 12 to 24 hours before.  And modify those.      Did not assess active range of motion and grip and prehension today because of increased discomfort we will reassess next time.  We will see her 12 June prior to her appointment with her physician on the 13th.      OT Education - 03/26/22 [phone]      Education Details progress and HEP    Person(s) Educated Patient    Methods Explanation;Demonstration;Tactile cues;Verbal cues;Handout    Comprehension Verbal cues required;Returned demonstration;Verbalized understanding                 OT Long Term Goals - 03/09/22 1347       OT LONG TERM GOAL #1   Title  Patient to be independent in home program to increase active range of motion to within normal limits with decreased pain and edema in bilateral hands without being on prednisone    Baseline Patient on prednisone since Monday.  Continue to show some increase edema in bilateral hands and some MCP and PIP flexion decreased by 5 degrees, grip and prehension decreased compared to a year ago.    Time 3    Period Weeks    Status New    Target Date 03/30/22      OT LONG TERM GOAL #2   Title Patient reports 3 joint protection principles or adaptive equipment to increase ease or use of hands and ADLs    Baseline Patient needs reinforcement and reeducation    Time 6    Period Weeks    Target Date 04/20/22      OT LONG TERM GOAL #3   Title Patient's grip and prehension strength increase without pain to about the same then she was February of last year    Baseline Grip strength on the right 52, left 41.  Lateral pinch right 18 left 14 pounds.  Three-point pinch 11 pounds in the right left 9 pounds still in range for her age but significantly decreased compared to last year.    Time 6    Period Weeks    Status New    Target Date 04/20/22      OT LONG TERM GOAL #4   Title Patient to verbalize several resources in the community where she can do exercise to maintain her flexibility mobility and strength    Baseline Provided patient this date information on arthritis lending series as well as exercise plus tai chi classes at senior center in community    Time 4    Period Weeks    Status New    Target Date 04/06/22                   Plan - 03/26/22 0925     Clinical  Impression Statement Patient referred to OT with diagnosis of bilateral rheumatoid arthritis as well as carpal tunnel symptoms on the right.   Patient had a shot in the right carpal tunnel 03/05/2022 by orthopedist as well as on prednisone she finished uplast week. Patient report decreased pain from a 10/10 week of the evaluation to last week to 0 out of 10 pain in the right and 3/10 in the left thumb mostly [place].  Patient showed last time increase active range of motion to within normal limits but this date patient report increased achiness and soreness since yesterday.  Upon assessment figure out patient had a conference a chair which since Wednesday and was holding books and Bible as well as writing a lot.  Will explain her increased pain and soreness .  Reinforced with patient again the importance of decreasing  edema/pain and stiffness with increasing or maintaining her motion and grip strength will increase without grip strengthening.  Patient grip strength increase greatly since evaluation.  Focus on decreasing inflammation by doing contrast, Isotoner gloves at night and joint protection and modifications.  Patient also to use CMC neoprene splint especially on the right to support thumb CMC out of carpal tunnel during prehension pinch and grip.   Patient reports she changed her diet to [place] provided patient with information of arthritis lending series that is done on a monthly basis at the [facility].  Also provided patient information on exercise classes and  tai chi classes at [facility].  Patient very interested in this information patient very motivated to follow new home program and will follow-up with OT next week.  Patient limited in use of bilateral hands and ADLs and IADLs by pain, swelling and inflammation.  Patient can benefit from skilled OT services to increase independence in use of bilateral hands.    OT Occupational Profile and History Problem Focused Assessment -  Including review of records relating to presenting problem    Occupational performance deficits (Please refer to evaluation for details): ADL's;IADL's;Rest and Sleep;Play;Leisure;Social Participation    Body Structure / Function / Physical Skills ADL;Decreased knowledge of use of DME;Strength;Pain;Edema;UE functional use;IADL;ROM;Flexibility    Rehab Potential Good    Clinical Decision Making Several treatment options, min-mod task modification necessary    Comorbidities Affecting Occupational Performance: May have comorbidities impacting occupational performance    Modification or Assistance to Complete Evaluation  No modification of tasks or assist necessary to complete eval    OT Frequency 1x / week    OT Duration 6 weeks    OT Treatment/Interventions Self-care/ADL training;Moist Heat;DME and/or AE instruction;Splinting;Contrast Bath;Therapeutic exercise;Paraffin;Patient/family education;Manual Therapy    Consulted and Agree with Plan of Care Patient             Patient will benefit from skilled therapeutic intervention in order to improve the following deficits and impairments:   Body Structure / Function / Physical Skills: ADL, Decreased knowledge of use of DME, Strength, Pain, Edema, UE functional use, IADL, ROM, Flexibility       Visit Diagnosis: Pain in right hand  Pain in left hand  Stiffness of right hand, not elsewhere classified  Stiffness of left hand, not elsewhere classified  Carpal tunnel syndrome, right upper limb  Muscle weakness (generalized)    Problem List Patient Active Problem List   Diagnosis Date Noted   Rheumatoid arthritis involving multiple sites ([place]) 03/08/2022   Insomnia 03/08/2022   Dysphagia 10/31/2021   Lymphadenopathy of head and neck 06/16/2021   Long-term use of immunosuppressant medication 05/31/2021   Eye drainage 03/17/2021   Fatigue 01/27/2021   Chronic pain of both shoulders 06/01/2020   Swelling of both hands 06/01/2020    Cervical disc disorder with radiculopathy of cervical region 05/24/2020   Post inflammatory hypopigmentation 12/30/2019   Claudication in peripheral vascular disease ([place]) 12/22/2019   Arthritis of carpometacarpal (CMC) joint of left thumb 10/28/2019   Hand arthritis 10/01/2019   Statin myopathy 08/30/2019   SOB (shortness of breath) on exertion 06/19/2019   Hyperlipidemia 04/08/2019   S/P CABG x 5 01/05/2019   Left leg pain 09/30/2018   Degenerative joint disease of low back 01/09/2017   Routine general medical examination at a health care facility 10/02/2016   GERD (gastroesophageal reflux disease) 07/28/2016   H/O malignant neoplasm of breast 06/19/2016    Rosalyn Gess, OTR/L,CLT 03/26/2022, 1:06 PM  [facility] [address], [place], [address] Phone: [phone]   Fax:  [phone]  Name: Jessica [name] MRN: [id number] Date of Birth: 07/11/1951

## 2022-04-02 ENCOUNTER — Ambulatory Visit: Payer: Medicare PPO | Admitting: Occupational Therapy

## 2022-04-02 DIAGNOSIS — M6281 Muscle weakness (generalized): Secondary | ICD-10-CM | POA: Diagnosis not present

## 2022-04-02 DIAGNOSIS — M25641 Stiffness of right hand, not elsewhere classified: Secondary | ICD-10-CM

## 2022-04-02 DIAGNOSIS — M25642 Stiffness of left hand, not elsewhere classified: Secondary | ICD-10-CM | POA: Diagnosis not present

## 2022-04-02 DIAGNOSIS — M79642 Pain in left hand: Secondary | ICD-10-CM | POA: Diagnosis not present

## 2022-04-02 DIAGNOSIS — G5601 Carpal tunnel syndrome, right upper limb: Secondary | ICD-10-CM | POA: Diagnosis not present

## 2022-04-02 DIAGNOSIS — M79641 Pain in right hand: Secondary | ICD-10-CM | POA: Diagnosis not present

## 2022-04-02 NOTE — Therapy (Signed)
[facility] [address] [place], [address] Phone: [phone]   Fax:  [phone]  Occupational Therapy Treatment  Patient Details  Name: Jessica [name] MRN: [id number] Date of Birth: June 07, 1951 Referring Provider (OT): Louanne Skye   Encounter Date: 04/02/2022   OT End of Session - 04/02/22 1030     Visit Number 4    Number of Visits 6    Date for OT Re-Evaluation 04/20/22    OT Start Time 1030    OT Stop Time 1108    OT Time Calculation (min) 38 min    Activity Tolerance Patient tolerated treatment well    Behavior During Therapy [facility] for tasks assessed/performed             Past Medical History:  Diagnosis Date   Allergy    Breast cancer ([place]) 2000   Cancer ([place])    breast   Colon polyp    Coronary artery disease    GERD (gastroesophageal reflux disease)    History of blood transfusion    Hyperlipidemia    Migraine    history of migrarines, none as an adult   Personal history of radiation therapy 2000    Past Surgical History:  Procedure Laterality Date   ABDOMINAL HYSTERECTOMY     BREAST LUMPECTOMY Right 2000   BREAST SURGERY Right    calcifaction removed   BUNIONECTOMY Bilateral    COLONOSCOPY W/ POLYPECTOMY     CORONARY ARTERY BYPASS GRAFT N/A 01/05/2019   Procedure: CORONARY ARTERY BYPASS GRAFTING (CABG), using right leg saphenous endoscopic and open vein harvest, exploration left leg;  Surgeon: Gaye Pollack, MD;  Location: [place];  Service: Open Heart Surgery;  Laterality: N/A;   EYE SURGERY Bilateral    cataract   hemorrhoid     LEFT HEART CATH AND CORONARY ANGIOGRAPHY N/A 01/01/2019   Procedure: LEFT HEART CATH AND CORONARY ANGIOGRAPHY;  Surgeon: Jettie Booze, MD;  Location: [facility];  Service: Cardiovascular;  Laterality: N/A;   STERNAL WIRES REMOVAL N/A 05/11/2019   Procedure: STERNAL WIRES REMOVAL;  Surgeon: Gaye Pollack, MD;  Location: [facility];   Service: Thoracic;  Laterality: N/A;   TEE WITHOUT CARDIOVERSION N/A 01/05/2019   Procedure: TRANSESOPHAGEAL ECHOCARDIOGRAM (TEE);  Surgeon: Gaye Pollack, MD;  Location: [facility];  Service: Open Heart Surgery;  Laterality: N/A;    There were no vitals filed for this visit.   Subjective Assessment - 04/02/22 1029     Subjective  I was doing great this past week but then woke up this morning and my hand was hurting - swollen over the knuckles and midde finger - I cant remember that I done anything out of the ordery yesterday - started in middle of the night    Pertinent History Patient with increased pain since February 23 and bilateral hands with increased inflammation as well as swelling pain and numbness.  Patient report 10/10 pain in bilateral hands.  Did see since then Dr. Posey Pronto the rheumatologist as well as orthopedist on 03/05/22 and referred to this OT with bilateral hand rheumatoid arthritis and carpal tunnel symptoms in the right hand.  Checking ESR and CRP to assess inflammatory state. She is having new numbness and pain in her hands. The numbness is significantly improved s/p injection yesterday by Dr. Louanne Skye so we will wait to adjust medications for 1 week to see if this shot will help mre.  Patient on prednisone  to will be finishing that up in the next few days.    Patient Stated Goals I want you to help me to make sure that my hands does not get worse again I want to strengthen the mobility so I can do the things that I like to do.    Currently in Pain? Yes    Pain Score 8     Pain Location Hand    Pain Orientation Right    Pain Descriptors / Indicators Aching;Tender;Tightness    Pain Type Chronic pain    Pain Onset More than a month ago    Pain Frequency Constant                 Patient arrived with increased pain and swelling over right hand metacarpals as well as third digit.  Left hand doing great no pain.  Patient report right hand was doing well until last night around  1:00 since then has been hurting.  Patient cannot remember that she done anything different for the last 24 to 48 hours.  She is following joint protection and modifications.  Does think she may be slept wrong more with her hands in a fist.  Reviewed again with positioning of for sleeping and also provided her with a handout from arthritis learning series that was done in the past about sleeping.     8/10 pain in the right metacarpals and third digit. Patient reports she is off her prednisone now for 2 weeks.  Due for a follow-up with orthopedic MD tomorrow Reinforced again with patient importance of doing contrast, Isotoner gloves when she has a flareup. Her focus the rest of the day of decreasing pain and inflammation before focusing on range of motion strengthening again.        OT Treatments/Exercises (OP) - 04/02/22 0001       RUE Contrast Bath   Time 11 minutes    Comments prior to soft tissue              Pain decreased after contrast to 3/10  Reviewed with patient again home program for contrast 3-4 times a day morning for stiffness and pain in the evenings to calm things down. But 4  if flare up Fitted patient 2 visits ago with Isotoner gloves to use at nighttime for swelling and pain.  Encourage patient if she has a flareup like today to wear during the day 2. CMC neoprene splints for bilateral hands during the day with activities to prevent abduction of right CMC into carpal tunnel, great success with decreasing pain at bilateral CMC's except today on the right.   Reviewed tendon glides-patient had a hard time with lumbrical fist with MCP flexion to 90-encourage patient to block proximal to distal palmar crease with better success 10 reps.   Followed by intrinsic a fist and then composite fist.   opposition 10 reps 2 times a day very light to do not forceful or squeeze. Done palmar and radial abduction of the thumbs pain-free 10 reps     Review again with patient joint  protection and home modifications in ADL's and IADL"S using larger joints as well as building up handles or grips as well as respect for pain if pain lingers for 2 hours after activity over the next day increased pain.  Look back what she done 12 to 24 hours before.  And modify those.       Did not assess active range of motion and grip and prehension  today because of increased pain in right hand. Active range of motion appear within normal limits as well as opposition. Patient to see orthopedic MD tomorrow.            OT Education - 04/02/22 1029     Education Details progress and HEP    Person(s) Educated Patient    Methods Explanation;Demonstration;Tactile cues;Verbal cues;Handout    Comprehension Verbal cues required;Returned demonstration;Verbalized understanding                 OT Long Term Goals - 03/09/22 1347       OT LONG TERM GOAL #1   Title Patient to be independent in home program to increase active range of motion to within normal limits with decreased pain and edema in bilateral hands without being on prednisone    Baseline Patient on prednisone since Monday.  Continue to show some increase edema in bilateral hands and some MCP and PIP flexion decreased by 5 degrees, grip and prehension decreased compared to a year ago.    Time 3    Period Weeks    Status New    Target Date 03/30/22      OT LONG TERM GOAL #2   Title Patient reports 3 joint protection principles or adaptive equipment to increase ease or use of hands and ADLs    Baseline Patient needs reinforcement and reeducation    Time 6    Period Weeks    Target Date 04/20/22      OT LONG TERM GOAL #3   Title Patient's grip and prehension strength increase without pain to about the same then she was February of last year    Baseline Grip strength on the right 52, left 41.  Lateral pinch right 18 left 14 pounds.  Three-point pinch 11 pounds in the right left 9 pounds still in range for her age but  significantly decreased compared to last year.    Time 6    Period Weeks    Status New    Target Date 04/20/22      OT LONG TERM GOAL #4   Title Patient to verbalize several resources in the community where she can do exercise to maintain her flexibility mobility and strength    Baseline Provided patient this date information on arthritis lending series as well as exercise plus tai chi classes at senior center in community    Time 4    Period Weeks    Status New    Target Date 04/06/22                   Plan - 04/02/22 1030     Clinical Impression Statement Patient referred to OT with diagnosis of bilateral rheumatoid arthritis as well as carpal tunnel symptoms on the right.   Patient had a shot in the right carpal tunnel 03/05/2022 by orthopedist as well as on prednisone she finished up 2 wks ago.  Patient pain decreased from a 10/10-0-3/10 the past 2 weeks.  Patient did had some increased pain last week after writing a lot during a conference at church.  But was able to decrease her pain again this past week.  Patient arrived today with a 8/10 pain over right  metacarpals anf  third digit.  Patient reports not done anything that she thinks aggravated it but she may be slept with hand in fist.  Reinforced with patient again the importance of decreasing  edema/pain using contrast several times during the day  and Isotoner glove.  And then continue with active range of motion pain-free and she will be able to maintain her grip strength as seen in the past. Patient  use CMC neoprene splints with great success decreasing her CMC pain. Patient reports she changed her diet to [place] provided patient with information of arthritis learing series that is done on a monthly basis at the [facility].  Also provided patient information on exercise classes and tai chi classes at [facility].  Patient very interested in this information patient very motivated to follow new home  program and will follow-up with OT next week.  Patient is following up with orthopedic MD tomorrow.  Patient limited in use of bilateral hands and ADLs and IADLs by pain, swelling and inflammation.  Patient can benefit from skilled OT services to increase independence in use of bilateral hands.    OT Occupational Profile and History Problem Focused Assessment - Including review of records relating to presenting problem    Occupational performance deficits (Please refer to evaluation for details): ADL's;IADL's;Rest and Sleep;Play;Leisure;Social Participation    Body Structure / Function / Physical Skills ADL;Decreased knowledge of use of DME;Strength;Pain;Edema;UE functional use;IADL;ROM;Flexibility    Rehab Potential Good    Clinical Decision Making Several treatment options, min-mod task modification necessary    Comorbidities Affecting Occupational Performance: May have comorbidities impacting occupational performance    Modification or Assistance to Complete Evaluation  No modification of tasks or assist necessary to complete eval    OT Frequency 1x / week    OT Duration 6 weeks    OT Treatment/Interventions Self-care/ADL training;Moist Heat;DME and/or AE instruction;Splinting;Contrast Bath;Therapeutic exercise;Paraffin;Patient/family education;Manual Therapy    Consulted and Agree with Plan of Care Patient             Patient will benefit from skilled therapeutic intervention in order to improve the following deficits and impairments:   Body Structure / Function / Physical Skills: ADL, Decreased knowledge of use of DME, Strength, Pain, Edema, UE functional use, IADL, ROM, Flexibility       Visit Diagnosis: Pain in right hand  Pain in left hand  Stiffness of right hand, not elsewhere classified  Stiffness of left hand, not elsewhere classified  Muscle weakness (generalized)    Problem List Patient Active Problem List   Diagnosis Date Noted   Rheumatoid arthritis involving  multiple sites ([place]) 03/08/2022   Insomnia 03/08/2022   Dysphagia 10/31/2021   Lymphadenopathy of head and neck 06/16/2021   Long-term use of immunosuppressant medication 05/31/2021   Eye drainage 03/17/2021   Fatigue 01/27/2021   Chronic pain of both shoulders 06/01/2020   Swelling of both hands 06/01/2020   Cervical disc disorder with radiculopathy of cervical region 05/24/2020   Post inflammatory hypopigmentation 12/30/2019   Claudication in peripheral vascular disease ([place]) 12/22/2019   Arthritis of carpometacarpal (CMC) joint of left thumb 10/28/2019   Hand arthritis 10/01/2019   Statin myopathy 08/30/2019   SOB (shortness of breath) on exertion 06/19/2019   Hyperlipidemia 04/08/2019   S/P CABG x 5 01/05/2019   Left leg pain 09/30/2018   Degenerative joint disease of low back 01/09/2017   Routine general medical examination at a health care facility 10/02/2016   GERD (gastroesophageal reflux disease) 07/28/2016   H/O malignant neoplasm of breast 06/19/2016    Rosalyn Gess, OTR/L,CLT 04/02/2022, 11:16 AM  [facility] [address], [place], [address] Phone: [phone]   Fax:  [phone]  Name: Jessica [name] MRN: [id number] Date of Birth: [birth year]

## 2022-04-03 DIAGNOSIS — M06841 Other specified rheumatoid arthritis, right hand: Secondary | ICD-10-CM | POA: Diagnosis not present

## 2022-04-03 DIAGNOSIS — M06842 Other specified rheumatoid arthritis, left hand: Secondary | ICD-10-CM | POA: Diagnosis not present

## 2022-04-03 DIAGNOSIS — G5601 Carpal tunnel syndrome, right upper limb: Secondary | ICD-10-CM | POA: Diagnosis not present

## 2022-04-09 ENCOUNTER — Ambulatory Visit: Payer: Medicare PPO | Admitting: Occupational Therapy

## 2022-04-09 DIAGNOSIS — M6281 Muscle weakness (generalized): Secondary | ICD-10-CM

## 2022-04-09 DIAGNOSIS — M25641 Stiffness of right hand, not elsewhere classified: Secondary | ICD-10-CM | POA: Diagnosis not present

## 2022-04-09 DIAGNOSIS — G5601 Carpal tunnel syndrome, right upper limb: Secondary | ICD-10-CM | POA: Diagnosis not present

## 2022-04-09 DIAGNOSIS — M79641 Pain in right hand: Secondary | ICD-10-CM

## 2022-04-09 DIAGNOSIS — M25642 Stiffness of left hand, not elsewhere classified: Secondary | ICD-10-CM | POA: Diagnosis not present

## 2022-04-09 DIAGNOSIS — M79642 Pain in left hand: Secondary | ICD-10-CM | POA: Diagnosis not present

## 2022-04-09 NOTE — Therapy (Signed)
[facility] [address], [place], [address] Phone: [phone]   Fax:  [phone]  Occupational Therapy Treatment  Patient Details  Name: Jessica [name] MRN: [id number] Date of Birth: 15-Jan-1951 Referring Provider (OT): Louanne Skye   Encounter Date: 04/09/2022   OT End of Session - 04/09/22 1221     Visit Number 5    Number of Visits 6    Date for OT Re-Evaluation 04/20/22    OT Start Time 1200    OT Stop Time 1240    OT Time Calculation (min) 40 min    Activity Tolerance Patient tolerated treatment well    Behavior During Therapy [facility] for tasks assessed/performed             Past Medical History:  Diagnosis Date   Allergy    Breast cancer ([place]) 2000   Cancer ([place])    breast   Colon polyp    Coronary artery disease    GERD (gastroesophageal reflux disease)    History of blood transfusion    Hyperlipidemia    Migraine    history of migrarines, none as an adult   Personal history of radiation therapy 2000    Past Surgical History:  Procedure Laterality Date   ABDOMINAL HYSTERECTOMY     BREAST LUMPECTOMY Right 2000   BREAST SURGERY Right    calcifaction removed   BUNIONECTOMY Bilateral    COLONOSCOPY W/ POLYPECTOMY     CORONARY ARTERY BYPASS GRAFT N/A 01/05/2019   Procedure: CORONARY ARTERY BYPASS GRAFTING (CABG), using right leg saphenous endoscopic and open vein harvest, exploration left leg;  Surgeon: Gaye Pollack, MD;  Location: [place];  Service: Open Heart Surgery;  Laterality: N/A;   EYE SURGERY Bilateral    cataract   hemorrhoid     LEFT HEART CATH AND CORONARY ANGIOGRAPHY N/A 01/01/2019   Procedure: LEFT HEART CATH AND CORONARY ANGIOGRAPHY;  Surgeon: Jettie Booze, MD;  Location: [place] [facility];  Service: Cardiovascular;  Laterality: N/A;   STERNAL WIRES REMOVAL N/A 05/11/2019   Procedure: STERNAL WIRES REMOVAL;  Surgeon: Gaye Pollack, MD;  Location: [facility];   Service: Thoracic;  Laterality: N/A;   TEE WITHOUT CARDIOVERSION N/A 01/05/2019   Procedure: TRANSESOPHAGEAL ECHOCARDIOGRAM (TEE);  Surgeon: Gaye Pollack, MD;  Location: [place];  Service: Open Heart Surgery;  Laterality: N/A;    There were no vitals filed for this visit.   Subjective Assessment - 04/09/22 1201     Subjective  I seen the hand doctor last Tuesday after you.  He released me but said my inflammation markers were still up.  I am seeing the rheumatologist tomorrow again so maybe we will repeat my lab work.  It is again my right hand keeps on bothering me is better than last week but still pain and tender.  I think is the way sleep    Pertinent History Patient with increased pain since February 23 and bilateral hands with increased inflammation as well as swelling pain and numbness.  Patient report 10/10 pain in bilateral hands.  Did see since then Dr. Posey Pronto the rheumatologist as well as orthopedist on 03/05/22 and referred to this OT with bilateral hand rheumatoid arthritis and carpal tunnel symptoms in the right hand.  Checking ESR and CRP to assess inflammatory state. She is having new numbness and pain in her hands. The numbness is significantly improved s/p injection yesterday by Dr. Louanne Skye so we will  wait to adjust medications for 1 week to see if this shot will help mre.  Patient on prednisone to will be finishing that up in the next few days.    Patient Stated Goals I want you to help me to make sure that my hands does not get worse again I want to strengthen the mobility so I can do the things that I like to do.    Currently in Pain? Yes    Pain Score 3     Pain Location Hand    Pain Orientation Right    Pain Descriptors / Indicators Aching;Tender;Tightness    Pain Type Chronic pain    Pain Onset 1 to 4 weeks ago    Pain Frequency Intermittent                [facility] OT Assessment - 04/09/22 0001       Strength   Right Hand Grip (lbs) 40    Right Hand Lateral Pinch  11 lbs    Right Hand 3 Point Pinch 7 lbs    Left Hand Grip (lbs) 56    Left Hand Lateral Pinch 12 lbs    Left Hand 3 Point Pinch 11 lbs      Right Hand AROM   R Index  MCP 0-90 80 Degrees    R Index PIP 0-100 100 Degrees    R Long  MCP 0-90 80 Degrees    R Long PIP 0-100 890 Degrees    R Ring  MCP 0-90 90 Degrees    R Little  MCP 0-90 90 Degrees    R Little PIP 0-100 100 Degrees      Left Hand AROM   L Index  MCP 0-90 90 Degrees    L Index PIP 0-100 0 Degrees    L Long  MCP 0-90 90 Degrees    L Long PIP 0-100 100 Degrees    L Ring  MCP 0-90 90 Degrees    L Ring PIP 0-100 100 Degrees    L Little  MCP 0-90 90 Degrees    L Little PIP 0-100 95 Degrees              Since start of care left digit range of motion within normal limits as well as grip and prehension increased greatly with no pain within normal limits.          OT Treatments/Exercises (OP) - 04/09/22 0001       RUE Contrast Bath   Time 8 minutes    Comments prior to soft tissue and ROM             Pain decreased in the right hand after contrast to 1/10 Right hand pain better than last week.  Last Monday was 8/10 today coming in with 3/10 pain Grip and prehension strength decreased from 2 weeks ago but reinforced with patient to continue with contrast Isotoner glove and joint protection and not push motion if she has a flareup.   Reviewed with patient again home program for contrast 3-4 times a day morning for stiffness and pain in the evenings to calm things down.  Do not push motion at that time. Fitted patient in the past with Isotoner gloves to use at nighttime for swelling and pain.  Encourage patient if she has a flareup  to wear during the day 2. CMC neoprene splints for bilateral hands during the day with activities to prevent abduction of right CMC into carpal tunnel,  great success with decreasing pain at bilateral CMC's except today on the right.   Reviewed tendon glides-patient had a hard  time with lumbrical fist with MCP flexion to 90-encourage patient to block proximal to distal palmar crease with better success 10 reps.   Followed by intrinsic a fist and then composite fist.   opposition 10 reps 2 times a day very light to do not forceful or squeeze. Done palmar and radial abduction of the thumbs pain-free 10 reps      Patient to continue with joint protection and home modifications in ADL's and IADL"S using larger joints as well as building up handles or grips as well as respect for pain if pain lingers for 2 hours after activity over the next day increased pain.  Look back what she done 12 to 24 hours before.  And modify those.                      OT Education - 04/09/22 1221     Education Details progress and HEP    Person(s) Educated Patient    Methods Explanation;Demonstration;Tactile cues;Verbal cues;Handout    Comprehension Verbal cues required;Returned demonstration;Verbalized understanding                 OT Long Term Goals - 03/09/22 1347       OT LONG TERM GOAL #1   Title Patient to be independent in home program to increase active range of motion to within normal limits with decreased pain and edema in bilateral hands without being on prednisone    Baseline Patient on prednisone since Monday.  Continue to show some increase edema in bilateral hands and some MCP and PIP flexion decreased by 5 degrees, grip and prehension decreased compared to a year ago.    Time 3    Period Weeks    Status New    Target Date 03/30/22      OT LONG TERM GOAL #2   Title Patient reports 3 joint protection principles or adaptive equipment to increase ease or use of hands and ADLs    Baseline Patient needs reinforcement and reeducation    Time 6    Period Weeks    Target Date 04/20/22      OT LONG TERM GOAL #3   Title Patient's grip and prehension strength increase without pain to about the same then she was February of last year    Baseline Grip  strength on the right 52, left 41.  Lateral pinch right 18 left 14 pounds.  Three-point pinch 11 pounds in the right left 9 pounds still in range for her age but significantly decreased compared to last year.    Time 6    Period Weeks    Status New    Target Date 04/20/22      OT LONG TERM GOAL #4   Title Patient to verbalize several resources in the community where she can do exercise to maintain her flexibility mobility and strength    Baseline Provided patient this date information on arthritis lending series as well as exercise plus tai chi classes at senior center in community    Time 4    Period Weeks    Status New    Target Date 04/06/22                   Plan - 04/09/22 1222     Clinical Impression Statement Patient referred to OT with diagnosis of bilateral  rheumatoid arthritis as well as carpal tunnel symptoms on the right.   Patient had a shot in the right carpal tunnel 03/05/2022 by orthopedist as well as on prednisone she finished up 2 wks ago.  Patient pain decreased from a 10/10to 0-3/10 this week.  Patient did had some increased pain last week after writing a lot during a conference at church and sleeping the wrong.  But was able to decrease her pain again this past week.  Patient continues to have a little bit more pain in the right hand than the left over the second and third metacarpal.  Progressed really well the first week for the last 2 weeks had increased pain and tenderness.  Left hand active range of motion and strength within normal limits..  Reinforced with patient again the importance of decreasing  edema/pain using contrast several times during the day and Isotoner glove.  And then continue with active range of motion pain-free and she will be able to maintain her grip strength as seen in the past. Patient  use CMC neoprene splints with great success decreasing her CMC pain. Patient reports she changed her diet to [place] provided patient with information  of arthritis learing series that is done on a monthly basis at the [facility].  Also provided patient information on exercise classes and tai chi classes at [facility].  Patient very interested in this information patient very motivated to follow new home program and will follow-up with OT next week.  Patient is following up with rheumatology tomorrow.  Patient limited in use of bilateral hands and ADLs and IADLs by pain, swelling and inflammation.  Patient can benefit from skilled OT services to increase independence in use of bilateral hands.    OT Occupational Profile and History Problem Focused Assessment - Including review of records relating to presenting problem    Occupational performance deficits (Please refer to evaluation for details): ADL's;IADL's;Rest and Sleep;Play;Leisure;Social Participation    Body Structure / Function / Physical Skills ADL;Decreased knowledge of use of DME;Strength;Pain;Edema;UE functional use;IADL;ROM;Flexibility    Rehab Potential Good    Clinical Decision Making Several treatment options, min-mod task modification necessary    Comorbidities Affecting Occupational Performance: May have comorbidities impacting occupational performance    Modification or Assistance to Complete Evaluation  No modification of tasks or assist necessary to complete eval    OT Frequency 1x / week    OT Duration 6 weeks    OT Treatment/Interventions Self-care/ADL training;Moist Heat;DME and/or AE instruction;Splinting;Contrast Bath;Therapeutic exercise;Paraffin;Patient/family education;Manual Therapy    Consulted and Agree with Plan of Care Patient             Patient will benefit from skilled therapeutic intervention in order to improve the following deficits and impairments:   Body Structure / Function / Physical Skills: ADL, Decreased knowledge of use of DME, Strength, Pain, Edema, UE functional use, IADL, ROM, Flexibility       Visit Diagnosis: Pain in right  hand  Stiffness of right hand, not elsewhere classified  Muscle weakness (generalized)    Problem List Patient Active Problem List   Diagnosis Date Noted   Rheumatoid arthritis involving multiple sites ([place]) 03/08/2022   Insomnia 03/08/2022   Dysphagia 10/31/2021   Lymphadenopathy of head and neck 06/16/2021   Long-term use of immunosuppressant medication 05/31/2021   Eye drainage 03/17/2021   Fatigue 01/27/2021   Chronic pain of both shoulders 06/01/2020   Swelling of both hands 06/01/2020   Cervical disc disorder with  radiculopathy of cervical region 05/24/2020   Post inflammatory hypopigmentation 12/30/2019   Claudication in peripheral vascular disease ([place]) 12/22/2019   Arthritis of carpometacarpal (CMC) joint of left thumb 10/28/2019   Hand arthritis 10/01/2019   Statin myopathy 08/30/2019   SOB (shortness of breath) on exertion 06/19/2019   Hyperlipidemia 04/08/2019   S/P CABG x 5 01/05/2019   Left leg pain 09/30/2018   Degenerative joint disease of low back 01/09/2017   Routine general medical examination at a health care facility 10/02/2016   GERD (gastroesophageal reflux disease) 07/28/2016   H/O malignant neoplasm of breast 06/19/2016    Rosalyn Gess, OTR/L,CLT 04/09/2022, 12:45 PM  [facility] [address] [place], [address] Phone: [phone]   Fax:  [phone]  Name: Jessica [name] MRN: [id number] Date of Birth: [birth year]

## 2022-04-10 DIAGNOSIS — Z7960 Long term (current) use of unspecified immunomodulators and immunosuppressants: Secondary | ICD-10-CM | POA: Diagnosis not present

## 2022-04-10 DIAGNOSIS — M0579 Rheumatoid arthritis with rheumatoid factor of multiple sites without organ or systems involvement: Secondary | ICD-10-CM | POA: Diagnosis not present

## 2022-04-16 ENCOUNTER — Encounter: Payer: Self-pay | Admitting: Internal Medicine

## 2022-04-16 ENCOUNTER — Ambulatory Visit: Payer: Medicare PPO | Admitting: Occupational Therapy

## 2022-04-16 DIAGNOSIS — M25642 Stiffness of left hand, not elsewhere classified: Secondary | ICD-10-CM | POA: Diagnosis not present

## 2022-04-16 DIAGNOSIS — M25641 Stiffness of right hand, not elsewhere classified: Secondary | ICD-10-CM

## 2022-04-16 DIAGNOSIS — M79641 Pain in right hand: Secondary | ICD-10-CM

## 2022-04-16 DIAGNOSIS — G5601 Carpal tunnel syndrome, right upper limb: Secondary | ICD-10-CM | POA: Diagnosis not present

## 2022-04-16 DIAGNOSIS — M6281 Muscle weakness (generalized): Secondary | ICD-10-CM

## 2022-04-16 DIAGNOSIS — M79642 Pain in left hand: Secondary | ICD-10-CM | POA: Diagnosis not present

## 2022-04-19 ENCOUNTER — Encounter: Payer: Self-pay | Admitting: Internal Medicine

## 2022-04-26 ENCOUNTER — Ambulatory Visit (HOSPITAL_COMMUNITY)
Admission: EM | Admit: 2022-04-26 | Discharge: 2022-04-26 | Disposition: A | Discharge status: Home / Self Care | Payer: Medicare PPO | Attending: Physician Assistant | Admitting: Physician Assistant

## 2022-04-26 ENCOUNTER — Encounter (HOSPITAL_COMMUNITY): Payer: Self-pay

## 2022-04-26 DIAGNOSIS — M79642 Pain in left hand: Secondary | ICD-10-CM

## 2022-04-26 DIAGNOSIS — M069 Rheumatoid arthritis, unspecified: Secondary | ICD-10-CM

## 2022-04-26 DIAGNOSIS — M79641 Pain in right hand: Secondary | ICD-10-CM | POA: Diagnosis not present

## 2022-04-26 MED ORDER — PREDNISONE 10 MG (21) PO TBPK: ORAL_TABLET | Freq: Every day | ORAL | 0 refills | Status: DC

## 2022-04-26 NOTE — Discharge Instructions (Signed)
I believe that your symptoms are related to rheumatoid arthritis flare.  Please start prednisone as prescribed.  Do not take NSAIDs including aspirin, ibuprofen/Advil, naproxen/Aleve with this medication.  You can use Tylenol for additional symptom relief.  Please follow-up with your primary care provider for a rheumatology referral as soon as possible.  We cannot continue using prednisone as it will cause long-term side effects and you likely need to start other medications to prevent worsening symptoms or loss of function.  If you have any worsening symptoms including fever, pain, redness, swelling, numbness, paresthesias you need to be seen immediately.

## 2022-04-26 NOTE — ED Triage Notes (Signed)
Pt reports bilateral hand swelling x 3-4 days. Pt does not reports any other symptoms at this time.

## 2022-04-26 NOTE — ED Provider Notes (Signed)
[place]    CSN: [id number] Arrival date & time: 04/26/22  1226      History   Chief Complaint Chief Complaint  Patient presents with   Hand Problem    HPI Jessica [name] is a 71 y.o. female.   Patient presents today with a several day history of worsening bilateral hand pain that is worse on the left.  She has a history of rheumatoid arthritis and is not currently followed by rheumatology as she was looking to get a second opinion.  She is not currently on any disease modifying medications but has previously taken methotrexate and Plaquenil.  She has taken Goody powders without improvement of symptoms.  She is not taking any over-the-counter medication currently as this has been ineffective in managing her pain.  Pain is rated 8 on a 0-10 pain scale, described as aching, no aggravating relieving factors identified.  She was last treated with prednisone approximately May 2023.  She reports difficulty with daily activities due to the pain and stiffness in her hands.  She does have a primary care provider and is working with them to arrange a referral to a new rheumatologist; she is hopeful she will be able to see [facility].  Reports joints are swollen and she is unable to wear her rings.    Past Medical History:  Diagnosis Date   Allergy    Breast cancer ([place]) 2000   Cancer ([place])    breast   Colon polyp    Coronary artery disease    GERD (gastroesophageal reflux disease)    History of blood transfusion    Hyperlipidemia    Migraine    history of migrarines, none as an adult   Personal history of radiation therapy 2000    Patient Active Problem List   Diagnosis Date Noted   Rheumatoid arthritis involving multiple sites ([place]) 03/08/2022   Insomnia 03/08/2022   Dysphagia 10/31/2021   Lymphadenopathy of head and neck 06/16/2021   Long-term use of immunosuppressant medication 05/31/2021   Eye drainage 03/17/2021   Fatigue 01/27/2021   Chronic pain of both  shoulders 06/01/2020   Swelling of both hands 06/01/2020   Cervical disc disorder with radiculopathy of cervical region 05/24/2020   Post inflammatory hypopigmentation 12/30/2019   Claudication in peripheral vascular disease ([place]) 12/22/2019   Arthritis of carpometacarpal (CMC) joint of left thumb 10/28/2019   Hand arthritis 10/01/2019   Statin myopathy 08/30/2019   SOB (shortness of breath) on exertion 06/19/2019   Hyperlipidemia 04/08/2019   S/P CABG x 5 01/05/2019   Left leg pain 09/30/2018   Degenerative joint disease of low back 01/09/2017   Routine general medical examination at a health care facility 10/02/2016   GERD (gastroesophageal reflux disease) 07/28/2016   H/O malignant neoplasm of breast 06/19/2016    Past Surgical History:  Procedure Laterality Date   ABDOMINAL HYSTERECTOMY     BREAST LUMPECTOMY Right 2000   BREAST SURGERY Right    calcifaction removed   BUNIONECTOMY Bilateral    COLONOSCOPY W/ POLYPECTOMY     CORONARY ARTERY BYPASS GRAFT N/A 01/05/2019   Procedure: CORONARY ARTERY BYPASS GRAFTING (CABG), using right leg saphenous endoscopic and open vein harvest, exploration left leg;  Surgeon: Gaye Pollack, MD;  Location: [facility];  Service: Open Heart Surgery;  Laterality: N/A;   EYE SURGERY Bilateral    cataract   hemorrhoid     LEFT HEART CATH AND CORONARY ANGIOGRAPHY N/A 01/01/2019   Procedure: LEFT HEART  CATH AND CORONARY ANGIOGRAPHY;  Surgeon: Jettie Booze, MD;  Location: [facility];  Service: Cardiovascular;  Laterality: N/A;   STERNAL WIRES REMOVAL N/A 05/11/2019   Procedure: STERNAL WIRES REMOVAL;  Surgeon: Gaye Pollack, MD;  [facility];  Service: Thoracic;  Laterality: N/A;   TEE WITHOUT CARDIOVERSION N/A 01/05/2019   Procedure: TRANSESOPHAGEAL ECHOCARDIOGRAM (TEE);  Surgeon: Gaye Pollack, MD;  Location: [facility];  Service: Open Heart Surgery;  Laterality: N/A;    OB History   No obstetric history on file.      Home  Medications    Prior to Admission medications   Medication Sig Start Date End Date Taking? Authorizing Provider  predniSONE (STERAPRED UNI-PAK 21 TAB) 10 MG (21) TBPK tablet Take by mouth daily. Take 6 tabs by mouth daily  for 2 days, then 5 tabs for 2 days, then 4 tabs for 2 days, then 3 tabs for 2 days, 2 tabs for 2 days, then 1 tab by mouth daily for 2 days 04/26/22  Yes Lilleigh Hechavarria K, PA-C  Ascorbic Acid (VITAMIN C) 1000 MG tablet Take 2,000 mg by mouth daily.    [provider]  Cholecalciferol (VITAMIN D-3) 125 MCG (5000 UT) TABS Take 5,000 Units by mouth daily.    [provider]  folic acid (FOLVITE) 1 MG tablet Take 1 mg by mouth daily.    [provider]  hydroxychloroquine (PLAQUENIL) 200 MG tablet Take 200 mg by mouth 2 (two) times daily. 11/22/21   [provider]  loratadine (CLARITIN) 10 MG tablet Take 10 mg by mouth every evening.     [provider]  magnesium 30 MG tablet Take 30 mg by mouth daily.    [provider]  methotrexate (RHEUMATREX) 2.5 MG tablet Take 10 mg by mouth once a week. 12/12/21   [provider]  mometasone (ELOCON) 0.1 % cream Apply 1 application topically daily as needed (Rash). 11/09/21   Ralene Bathe, MD  Multiple Vitamin (MULTIVITAMIN WITH MINERALS) TABS tablet Take 1 tablet by mouth daily.    [provider]    Family History Family History  Problem Relation Age of Onset   Heart disease Brother    Asthma Paternal Aunt    Diabetes Paternal Aunt    Stroke Paternal Uncle    Kidney disease Paternal Uncle    Heart attack Father 30    Social History Social History   Tobacco Use   Smoking status: Never   Smokeless tobacco: Never  Vaping Use   Vaping Use: Never used  Substance Use Topics   Alcohol use: No   Drug use: No     Allergies   Codeine, Repatha [evolocumab], Sulfa antibiotics, and Sulfacetamide   Review of Systems Review of Systems  Constitutional:   Positive for activity change. Negative for appetite change, fatigue and fever.  Musculoskeletal:  Positive for arthralgias, joint swelling and myalgias.  Neurological:  Negative for dizziness, weakness, light-headedness, numbness and headaches.     Physical Exam Triage Vital Signs ED Triage Vitals  Enc Vitals Group     BP 04/26/22 1242 124/78     Pulse Rate 04/26/22 1240 64     Resp 04/26/22 1242 18     Temp 04/26/22 1240 98.4 F (36.9 C)     Temp Source 04/26/22 1240 Oral     SpO2 04/26/22 1242 98 %     Weight --      Height --  Head Circumference --      Peak Flow --      Pain Score --      Pain Loc --      Pain Edu? --      Excl. in [place]? --    No data found.  Updated Vital Signs BP 124/78 (BP Location: Left Arm)   Pulse 64   Temp 98.4 F (36.9 C) (Oral)   Resp 18   SpO2 98%   Visual Acuity Right Eye Distance:   Left Eye Distance:   Bilateral Distance:    Right Eye Near:   Left Eye Near:    Bilateral Near:     Physical Exam Vitals reviewed.  Constitutional:      General: She is awake. She is not in acute distress.    Appearance: Normal appearance. She is well-developed. She is not ill-appearing.     Comments: Very pleasant female appears stated age in no acute distress sitting comfortably in exam room  HENT:     Head: Normocephalic and atraumatic.  Cardiovascular:     Rate and Rhythm: Normal rate and regular rhythm.     Heart sounds: Normal heart sounds, S1 normal and S2 normal. No murmur heard. Pulmonary:     Effort: Pulmonary effort is normal.     Breath sounds: Normal breath sounds. No wheezing, rhonchi or rales.     Comments: Clear to auscultation bilaterally Abdominal:     Palpations: Abdomen is soft.     Tenderness: There is no abdominal tenderness.  Musculoskeletal:     Right hand: Swelling and tenderness present. Decreased range of motion. There is no disruption of two-point discrimination. Normal capillary refill.     Left hand: Swelling  and tenderness present. Decreased range of motion. There is no disruption of two-point discrimination. Normal capillary refill.     Comments: Hands: Swelling noted at the interphalangeal joints with associated tenderness to palpation.  Decreased flexion bilateral hands.  70% fist formation on right and 20% fist formation on the left.  Hands neurovascularly intact.  Psychiatric:        Behavior: Behavior is cooperative.      UC Treatments / Results  Labs (all labs ordered are listed, but only abnormal results are displayed) Labs Reviewed - No data to display  EKG   Radiology No results found.  Procedures Procedures (including critical care time)  Medications Ordered in UC Medications - No data to display  Initial Impression / Assessment and Plan / UC Course  I have reviewed the triage vital signs and the nursing notes.  Pertinent labs & imaging results that were available during my care of the patient were reviewed by me and considered in my medical decision making (see chart for details).     Suspect that symptoms are related to rheumatoid arthritis flare as patient is not currently on any disease modifying medication.  We will start prednisone taper to help with symptoms with instruction not to take NSAIDs with this medication.  Discussed the importance of following up with a rheumatologist as she will likely need disease modifying medication to prevent worsening symptoms and loss of function in the future.  We also discussed that she cannot continue taking prednisone on a regular basis as it will likely lead to long-term side effects.  She can use heat as well as Tylenol for additional pain relief.  Recommended she reach out to her primary care provider to arrange rheumatology referral as soon as possible.  Discussed  that if she has any worsening symptoms including increased pain, redness, swelling, weakness, paresthesias that she should be seen immediately.  Strict return  precautions given.  Final Clinical Impressions(s) / UC Diagnoses   Final diagnoses:  Rheumatoid arthritis flare ([place])  Bilateral hand pain     Discharge Instructions      I believe that your symptoms are related to rheumatoid arthritis flare.  Please start prednisone as prescribed.  Do not take NSAIDs including aspirin, ibuprofen/Advil, naproxen/Aleve with this medication.  You can use Tylenol for additional symptom relief.  Please follow-up with your primary care provider for a rheumatology referral as soon as possible.  We cannot continue using prednisone as it will cause long-term side effects and you likely need to start other medications to prevent worsening symptoms or loss of function.  If you have any worsening symptoms including fever, pain, redness, swelling, numbness, paresthesias you need to be seen immediately.     ED Prescriptions     Medication Sig Dispense Auth. Provider   predniSONE (STERAPRED UNI-PAK 21 TAB) 10 MG (21) TBPK tablet Take by mouth daily. Take 6 tabs by mouth daily  for 2 days, then 5 tabs for 2 days, then 4 tabs for 2 days, then 3 tabs for 2 days, 2 tabs for 2 days, then 1 tab by mouth daily for 2 days 42 tablet Zhoey Blackstock K, PA-C      PDMP not reviewed this encounter.   Terrilee Croak, PA-C 04/26/22 1327

## 2022-04-30 ENCOUNTER — Ambulatory Visit: Payer: Medicare PPO | Attending: Orthopedic Surgery | Admitting: Occupational Therapy

## 2022-04-30 DIAGNOSIS — M79641 Pain in right hand: Secondary | ICD-10-CM | POA: Insufficient documentation

## 2022-04-30 DIAGNOSIS — M6281 Muscle weakness (generalized): Secondary | ICD-10-CM | POA: Insufficient documentation

## 2022-04-30 DIAGNOSIS — M25641 Stiffness of right hand, not elsewhere classified: Secondary | ICD-10-CM | POA: Insufficient documentation

## 2022-04-30 DIAGNOSIS — M25642 Stiffness of left hand, not elsewhere classified: Secondary | ICD-10-CM | POA: Insufficient documentation

## 2022-04-30 DIAGNOSIS — M79642 Pain in left hand: Secondary | ICD-10-CM | POA: Diagnosis not present

## 2022-04-30 NOTE — Therapy (Signed)
[facility] [address] [place], [address] Phone: [phone]   Fax:  [phone]  Occupational Therapy Treatment  Patient Details  Name: Jessica [name] MRN: [id number] Date of Birth: 09/26/1951 Referring Provider (OT): Louanne Skye   Encounter Date: 04/30/2022   OT End of Session - 04/30/22 1150     Visit Number 7    Number of Visits 9    Date for OT Re-Evaluation 05/28/22    OT Start Time 1115    OT Stop Time 1141    OT Time Calculation (min) 26 min    Activity Tolerance Patient tolerated treatment well    Behavior During Therapy [facility] for tasks assessed/performed             Past Medical History:  Diagnosis Date   Allergy    Breast cancer ([place]) 2000   Cancer ([place])    breast   Colon polyp    Coronary artery disease    GERD (gastroesophageal reflux disease)    History of blood transfusion    Hyperlipidemia    Migraine    history of migrarines, none as an adult   Personal history of radiation therapy 2000    Past Surgical History:  Procedure Laterality Date   ABDOMINAL HYSTERECTOMY     BREAST LUMPECTOMY Right 2000   BREAST SURGERY Right    calcifaction removed   BUNIONECTOMY Bilateral    COLONOSCOPY W/ POLYPECTOMY     CORONARY ARTERY BYPASS GRAFT N/A 01/05/2019   Procedure: CORONARY ARTERY BYPASS GRAFTING (CABG), using right leg saphenous endoscopic and open vein harvest, exploration left leg;  Surgeon: Gaye Pollack, MD;  Location: [facility];  Service: Open Heart Surgery;  Laterality: N/A;   EYE SURGERY Bilateral    cataract   hemorrhoid     LEFT HEART CATH AND CORONARY ANGIOGRAPHY N/A 01/01/2019   Procedure: LEFT HEART CATH AND CORONARY ANGIOGRAPHY;  Surgeon: Jettie Booze, MD;  Location: [facility];  Service: Cardiovascular;  Laterality: N/A;   STERNAL WIRES REMOVAL N/A 05/11/2019   Procedure: STERNAL WIRES REMOVAL;  Surgeon: Gaye Pollack, MD;  [facility];   Service: Thoracic;  Laterality: N/A;   TEE WITHOUT CARDIOVERSION N/A 01/05/2019   Procedure: TRANSESOPHAGEAL ECHOCARDIOGRAM (TEE);  Surgeon: Gaye Pollack, MD;  Location: [facility];  Service: Open Heart Surgery;  Laterality: N/A;    There were no vitals filed for this visit.   Subjective Assessment - 04/30/22 1149     Subjective  I just had this bad flareup last week Thursday.  And is always just my hands.  I ended up going to urgent care and they put me on steroids.  I am seeing Dr. Sharlet Salina tomorrow to see if I can get a second opinion for rheumatology and I am also having appointment with GI beginning of August to see about her leaking gut.  It seems to just come prevent the flareups.    Pertinent History Patient with increased pain since February 23 and bilateral hands with increased inflammation as well as swelling pain and numbness.  Patient report 10/10 pain in bilateral hands.  Did see since then Dr. Posey Pronto the rheumatologist as well as orthopedist on 03/05/22 and referred to this OT with bilateral hand rheumatoid arthritis and carpal tunnel symptoms in the right hand.  Checking ESR and CRP to assess inflammatory state. She is having new numbness and pain in her hands. The numbness is significantly improved  s/p injection yesterday by Dr. Louanne Skye so we will wait to adjust medications for 1 week to see if this shot will help mre.  Patient on prednisone to will be finishing that up in the next few days.    Patient Stated Goals I want you to help me to make sure that my hands does not get worse again I want to strengthen the mobility so I can do the things that I like to do.    Currently in Pain? Yes    Pain Score 2     Pain Location Wrist    Pain Orientation Right    Pain Descriptors / Indicators Tender;Tightness    Pain Type Chronic pain    Pain Onset 1 to 4 weeks ago    Pain Frequency Intermittent                [facility] OT Assessment - 04/30/22 0001       AROM   Right Wrist Extension  30 Degrees    Right Wrist Flexion 65 Degrees    Left Wrist Extension 70 Degrees    Left Wrist Flexion 90 Degrees      Right Hand AROM   R Index  MCP 0-90 80 Degrees    R Index PIP 0-100 100 Degrees    R Long  MCP 0-90 80 Degrees    R Long PIP 0-100 100 Degrees    R Ring  MCP 0-90 85 Degrees    R Ring PIP 0-100 100 Degrees    R Little  MCP 0-90 90 Degrees    R Little PIP 0-100 90 Degrees      Left Hand AROM   L Index  MCP 0-90 90 Degrees    L Index PIP 0-100 0 Degrees    L Long  MCP 0-90 90 Degrees    L Long PIP 0-100 100 Degrees    L Ring  MCP 0-90 90 Degrees    L Ring PIP 0-100 100 Degrees    L Little  MCP 0-90 90 Degrees    L Little PIP 0-100 95 Degrees                 Patient returns today after not being seen for 2 weeks.  Patient report had to go to urgent care because of a flareup in hands last week.   Patient was put on steroid again.  Fourth day into a 10-day course.   Patient has appointment with GI about leaking got beginning of August. Appointment with family doctor tomorrow to go over her lab work that she has questions about.  Active range of motion return to within normal limits compared to 2 weeks ago in bilateral hands. Do have decreased active range of motion for right wrist flexion extension with some discomfort and pain over flexors.   Patient was fitted  in the past with a wrist immobilization splint to wear with heavy activities for a few days.   Appear patient does better with contrast and not any moist heat even if no flareup     Fitted patient in the past with Isotoner gloves to use at nighttime for swelling and pain.  Encourage patient if she has a flareup  to wear during the day . If a flareup doing contrast, Isotoner gloves and pain-free active range of motion.     reviewed tendon glides- Followed by intrinsic a fist and then composite fist.   opposition 10 reps 2 times a day very  light to do not forceful or squeeze. Done palmar and  radial abduction of the thumbs pain-free 10 reps      Patient to continue with joint protection and home modifications in ADL's and IADL"S using larger joints as well as building up handles or grips as well as respect for pain if pain lingers for 2 hours after activity over the next day increased pain.  Look back what she done 12 to 24 hours before.  And modify those. Reviewed again with patient this date after her flareup last week.               OT Education - 04/30/22 1150     Education Details Home program, joint protection    Person(s) Educated Patient    Methods Explanation;Demonstration;Tactile cues;Verbal cues;Handout    Comprehension Verbal cues required;Returned demonstration;Verbalized understanding                 OT Long Term Goals - 04/16/22 1216       OT LONG TERM GOAL #1   Title Patient to be independent in home program to increase active range of motion to within normal limits with decreased pain and edema in bilateral hands without being on prednisone    Status Achieved      OT LONG TERM GOAL #2   Title Patient reports 3 joint protection principles or adaptive equipment to increase ease or use of hands and ADLs    Baseline Reviewed again with patient joint protection this date patient has some pain over FCR on the right.  Using a wrist flexion with large joint.  Fitted with a wrist brace today and reviewed    Time 6    Period Weeks    Status On-going    Target Date 05/28/22      OT LONG TERM GOAL #3   Title Patient's grip and prehension strength increase without pain to about the same then she was February of last year    Baseline Grip strength on the right 52, left 41.  Lateral pinch right 18 left 14 pounds.  Three-point pinch 11 pounds in the right left 9 pounds still in range for her age but significantly decreased compared to last year.  Still decreased compared to prior    Time 6    Period Weeks    Status On-going    Target Date 05/28/22       OT LONG TERM GOAL #4   Title Patient to verbalize several resources in the community where she can do exercise to maintain her flexibility mobility and strength    Baseline Patient participated in arthritis lending series.  Has knowledge of [employer] as well as tai chi and [facility] exercises    Status Achieved                   Plan - 04/30/22 1150     Clinical Impression Statement Patient referred to OT with diagnosis of bilateral rheumatoid arthritis as well as carpal tunnel symptoms on the right.   Patient had a shot in the right carpal tunnel 03/05/2022 by orthopedist as well as on prednisone she finished up 5 wks ago.  Patient pain decreased from a 10/10 to 0-3/10  2 weeks ago.  Patient made great progress until about last week.  Patient is active range of motion improved to within normal limits as well as grip and prehension strength.  Pain was under control.  But patient arrive after having a flareup  last week and she had to go to the urgent care.  Patient now again on steroids going in fourth day of a 10-day course.  Patient seen her family doctor tomorrow and wants her to review her lab work from last rheumatology appointment.  Discussed with patient that she can continue with contrast as well as pain-free active range of motion and joint protection.  But need more intervention from rheumatology as well as family doctor.  Patient to follow-up with me in 6 weeks again to monitor and assess if able to maintain range of motion and strength in bilateral hands... Patient continues to use CMC neoprene splints with great success decreasing her CMC pain. Patient reports she changed her diet to [place] a few months ago as well as she is attending the arthritis learing series that is done on a monthly basis at the [facility].  Also provided patient in the past information on exercise classes and tai chi classes at [facility] but did not participate in those  yet   Patient limited in use of bilateral hands and ADLs and IADLs by pain, swelling and inflammation.  Patient can benefit from skilled OT services to increase independence in use of bilateral hands.    OT Occupational Profile and History Problem Focused Assessment - Including review of records relating to presenting problem    Occupational performance deficits (Please refer to evaluation for details): ADL's;IADL's;Rest and Sleep;Play;Leisure;Social Participation    Body Structure / Function / Physical Skills ADL;Decreased knowledge of use of DME;Strength;Pain;Edema;UE functional use;IADL;ROM;Flexibility    Rehab Potential Good    Clinical Decision Making Several treatment options, min-mod task modification necessary    Comorbidities Affecting Occupational Performance: May have comorbidities impacting occupational performance    Modification or Assistance to Complete Evaluation  No modification of tasks or assist necessary to complete eval    OT Frequency Monthly    OT Duration 6 weeks    OT Treatment/Interventions Self-care/ADL training;Moist Heat;DME and/or AE instruction;Splinting;Contrast Bath;Therapeutic exercise;Paraffin;Patient/family education;Manual Therapy    Consulted and Agree with Plan of Care Patient             Patient will benefit from skilled therapeutic intervention in order to improve the following deficits and impairments:   Body Structure / Function / Physical Skills: ADL, Decreased knowledge of use of DME, Strength, Pain, Edema, UE functional use, IADL, ROM, Flexibility       Visit Diagnosis: Stiffness of right hand, not elsewhere classified  Muscle weakness (generalized)  Pain in right hand  Pain in left hand  Stiffness of left hand, not elsewhere classified    Problem List Patient Active Problem List   Diagnosis Date Noted   Rheumatoid arthritis involving multiple sites ([place]) 03/08/2022   Insomnia 03/08/2022   Dysphagia 10/31/2021   Lymphadenopathy  of head and neck 06/16/2021   Long-term use of immunosuppressant medication 05/31/2021   Eye drainage 03/17/2021   Fatigue 01/27/2021   Chronic pain of both shoulders 06/01/2020   Swelling of both hands 06/01/2020   Cervical disc disorder with radiculopathy of cervical region 05/24/2020   Post inflammatory hypopigmentation 12/30/2019   Claudication in peripheral vascular disease ([place]) 12/22/2019   Arthritis of carpometacarpal (CMC) joint of left thumb 10/28/2019   Hand arthritis 10/01/2019   Statin myopathy 08/30/2019   SOB (shortness of breath) on exertion 06/19/2019   Hyperlipidemia 04/08/2019   S/P CABG x 5 01/05/2019   Left leg pain 09/30/2018   Degenerative joint disease of low back  01/09/2017   Routine general medical examination at a health care facility 10/02/2016   GERD (gastroesophageal reflux disease) 07/28/2016   H/O malignant neoplasm of breast 06/19/2016    Rosalyn Gess, OTR/L,CLT 04/30/2022, 11:55 AM  [facility] [address] [place], [address] Phone: [phone]   Fax:  [phone]  Name: Jessica [name] MRN: [id number] Date of Birth: 09-Jan-1951

## 2022-05-01 ENCOUNTER — Encounter: Payer: Self-pay | Admitting: Internal Medicine

## 2022-05-01 ENCOUNTER — Ambulatory Visit: Payer: Medicare PPO | Admitting: Internal Medicine

## 2022-05-01 VITALS — BP 122/74 | HR 50 | Resp 18 | Ht 66.0 in | Wt 165.4 lb

## 2022-05-01 DIAGNOSIS — R5383 Other fatigue: Secondary | ICD-10-CM | POA: Diagnosis not present

## 2022-05-01 DIAGNOSIS — D649 Anemia, unspecified: Secondary | ICD-10-CM

## 2022-05-01 DIAGNOSIS — M069 Rheumatoid arthritis, unspecified: Secondary | ICD-10-CM | POA: Diagnosis not present

## 2022-05-01 LAB — VITAMIN B12: Vitamin B-12: 527 pg/mL (ref 211–911)

## 2022-05-01 LAB — HEMOGLOBIN A1C: Hgb A1c MFr Bld: 6.2 % (ref 4.6–6.5)

## 2022-05-01 LAB — CBC
HCT: 35.9 % — ABNORMAL LOW (ref 36.0–46.0)
Hemoglobin: 11.8 g/dL — ABNORMAL LOW (ref 12.0–15.0)
MCHC: 32.9 g/dL (ref 30.0–36.0)
MCV: 87.2 fl (ref 78.0–100.0)
Platelets: 367 10*3/uL (ref 150.0–400.0)
RBC: 4.12 Mil/uL (ref 3.87–5.11)
RDW: 14.6 % (ref 11.5–15.5)
WBC: 9.2 10*3/uL (ref 4.0–10.5)

## 2022-05-01 LAB — VITAMIN D 25 HYDROXY (VIT D DEFICIENCY, FRACTURES): VITD: 59.99 ng/mL (ref 30.00–100.00)

## 2022-05-01 LAB — FERRITIN: Ferritin: 233.9 ng/mL (ref 10.0–291.0)

## 2022-05-01 NOTE — Progress Notes (Signed)
   Subjective:   Patient ID: Jessica [name], female    DOB: 06-28-1951, 71 y.o.   MRN: [id number]  HPI The patient is a 71 YO female coming in for concerns. Wants second opinion rheumatology.  Review of Systems  Constitutional:  Positive for fatigue.  HENT: Negative.    Eyes: Negative.   Respiratory:  Negative for cough, chest tightness and shortness of breath.   Cardiovascular:  Negative for chest pain, palpitations and leg swelling.  Gastrointestinal:  Negative for abdominal distention, abdominal pain, constipation, diarrhea, nausea and vomiting.  Musculoskeletal:  Positive for arthralgias and myalgias.  Skin: Negative.   Neurological: Negative.   Psychiatric/Behavioral: Negative.      Objective:  Physical Exam Constitutional:      Appearance: She is well-developed.  HENT:     Head: Normocephalic and atraumatic.  Cardiovascular:     Rate and Rhythm: Normal rate and regular rhythm.  Pulmonary:     Effort: Pulmonary effort is normal. No respiratory distress.     Breath sounds: Normal breath sounds. No wheezing or rales.  Abdominal:     General: Bowel sounds are normal. There is no distension.     Palpations: Abdomen is soft.     Tenderness: There is no abdominal tenderness. There is no rebound.  Musculoskeletal:        General: Tenderness present.     Cervical back: Normal range of motion.  Skin:    General: Skin is warm and dry.  Neurological:     Mental Status: She is alert and oriented to person, place, and time.     Coordination: Coordination normal.     Vitals:   05/01/22 1309  BP: 122/74  Pulse: (!) 50  Resp: 18  SpO2: 98%  Weight: 165 lb 6.4 oz (75 kg)  Height: 5\' 6"  (1.676 m)    Assessment & Plan:

## 2022-05-01 NOTE — Patient Instructions (Signed)
We will check the labs today. 

## 2022-05-02 ENCOUNTER — Encounter: Payer: Self-pay | Admitting: Internal Medicine

## 2022-05-02 NOTE — Assessment & Plan Note (Signed)
She is still having symptoms and wants second opinion about options. Referral to rheumatology done today.

## 2022-05-02 NOTE — Assessment & Plan Note (Signed)
Checking CBC, ferritin, B12, vitamin D, HgA1c today to rule out metabolic causes for fatigue. Adjust as appropriate.

## 2022-05-17 ENCOUNTER — Ambulatory Visit: Payer: Medicare PPO | Admitting: Dermatology

## 2022-05-22 ENCOUNTER — Ambulatory Visit: Payer: Medicare PPO | Admitting: Internal Medicine

## 2022-05-22 ENCOUNTER — Encounter: Payer: Self-pay | Admitting: Internal Medicine

## 2022-05-22 VITALS — BP 130/76 | HR 53 | Ht 66.0 in | Wt 166.0 lb

## 2022-05-22 DIAGNOSIS — R140 Abdominal distension (gaseous): Secondary | ICD-10-CM | POA: Diagnosis not present

## 2022-05-22 DIAGNOSIS — K5900 Constipation, unspecified: Secondary | ICD-10-CM | POA: Diagnosis not present

## 2022-05-22 NOTE — Patient Instructions (Signed)
If you are age 72 or older, your body mass index should be between 23-30. Your Body mass index is 26.79 kg/m. If this is out of the aforementioned range listed, please consider follow up with your Primary Care Provider. ________________________________________________________  The [facility] providers would like to encourage you to use [facility] to communicate with providers for non-urgent requests or questions.  Due to long hold times on the telephone, sending your provider a message by [facility] may be a faster and more efficient way to get a response.  Please allow 48 business hours for a response.  Please remember that this is for non-urgent requests.  _______________________________________________________  Walk 30 minutes a day.  Eat 2 kiwis per day.  OK to try Activa yogurt.  You will be due for recall colonoscopy in January 2025.  You will follow up in the office on an as needed basis.   Thank you for entrusting me with your care and choosing [facility].  Dr Lorenso Courier

## 2022-05-22 NOTE — Progress Notes (Signed)
Chief Complaint: Bloating  HPI : 71 year old female with history of rheumatoid arthritis, breast cancer, CAD, GERD presents with bloating  She was previously diagnosed with rheumatoid arthritis and started reading about a leaky gut. She now follows a Mediterranean diet, which she thinks has helped with her RA symptoms. She has also cut out all sugary beverages. She is not on any immunosuppressive medications at this time and is seeking a second opinion from another rheumatologist about how to treat her RA. She does have issues with fatigue and endorses bloating over the last few months.  She tends to be more constipated, which has been present for several years. She usually drinks SmoothMove tea and takes magnesium as well to try to help with constipation. For fiber supplementation, she is taking flax seed. On average she has 1 BM once every 2 days. Denies ab pain and rectal bleeding. Denies N&V, chest burning, or regurgitation. Had some dysphagia earlier this year but this has gotten better. She has lost weight due to the changes in her diet. Denies family history of GI issues. Last colonoscopy was in 2015 done in [place], and patient states that she was recommended for 10 year follow up (she states that Dr. Sharlet Salina reviewed the colonoscopy report). She drinks enough water every day.  Wt Readings from Last 3 Encounters:  05/22/22 166 lb (75.3 kg)  05/01/22 165 lb 6.4 oz (75 kg)  03/12/22 164 lb (74.4 kg)   Past Medical History:  Diagnosis Date   Allergy    Breast cancer ([place]) 2000   Cancer ([place])    breast   Colon polyp    Coronary artery disease    GERD (gastroesophageal reflux disease)    History of blood transfusion    Hyperlipidemia    Migraine    history of migrarines, none as an adult   Personal history of radiation therapy 2000     Past Surgical History:  Procedure Laterality Date   ABDOMINAL HYSTERECTOMY     BREAST LUMPECTOMY Right 2000   BREAST SURGERY Right     calcifaction removed   BUNIONECTOMY Bilateral    COLONOSCOPY W/ POLYPECTOMY     CORONARY ARTERY BYPASS GRAFT N/A 01/05/2019   Procedure: CORONARY ARTERY BYPASS GRAFTING (CABG), using right leg saphenous endoscopic and open vein harvest, exploration left leg;  Surgeon: Gaye Pollack, MD;  Location: [place];  Service: Open Heart Surgery;  Laterality: N/A;   EYE SURGERY Bilateral    cataract   hemorrhoid     LEFT HEART CATH AND CORONARY ANGIOGRAPHY N/A 01/01/2019   Procedure: LEFT HEART CATH AND CORONARY ANGIOGRAPHY;  Surgeon: Jettie Booze, MD;  Location: [place] [facility];  Service: Cardiovascular;  Laterality: N/A;   STERNAL WIRES REMOVAL N/A 05/11/2019   Procedure: STERNAL WIRES REMOVAL;  Surgeon: Gaye Pollack, MD;  [facility];  Service: Thoracic;  Laterality: N/A;   TEE WITHOUT CARDIOVERSION N/A 01/05/2019   Procedure: TRANSESOPHAGEAL ECHOCARDIOGRAM (TEE);  Surgeon: Gaye Pollack, MD;  Location: [place];  Service: Open Heart Surgery;  Laterality: N/A;   Family History  Problem Relation Age of Onset   Heart disease Brother    Asthma Paternal Aunt    Diabetes Paternal Aunt    Stroke Paternal Uncle    Kidney disease Paternal Uncle    Heart attack Father 62   Social History   Tobacco Use   Smoking status: Never   Smokeless tobacco: Never  Vaping Use   Vaping  Use: Never used  Substance Use Topics   Alcohol use: No   Drug use: No   Current Outpatient Medications  Medication Sig Dispense Refill   Ascorbic Acid (VITAMIN C) 1000 MG tablet Take 2,000 mg by mouth daily.     Cholecalciferol (VITAMIN D-3) 125 MCG (5000 UT) TABS Take 5,000 Units by mouth daily.     loratadine (CLARITIN) 10 MG tablet Take 10 mg by mouth every evening.      magnesium 30 MG tablet Take 30 mg by mouth daily.     mometasone (ELOCON) 0.1 % cream Apply 1 application topically daily as needed (Rash). 15 g 0   Multiple Vitamin (MULTIVITAMIN WITH MINERALS) TABS tablet Take 1 tablet by mouth daily.      folic acid (FOLVITE) 1 MG tablet Take 1 mg by mouth daily. (Patient not taking: Reported on 05/22/2022)     hydroxychloroquine (PLAQUENIL) 200 MG tablet Take 200 mg by mouth 2 (two) times daily. (Patient not taking: Reported on 05/22/2022)     methotrexate (RHEUMATREX) 2.5 MG tablet Take 10 mg by mouth once a week. (Patient not taking: Reported on 05/22/2022)     No current facility-administered medications for this visit.   Allergies  Allergen Reactions   Codeine Nausea And Vomiting   Repatha [Evolocumab]     MYALGIAS   Sulfa Antibiotics Swelling   Sulfacetamide Swelling    Review of Systems: All systems reviewed and negative except where noted in HPI.   Physical Exam: BP 130/76   Pulse (!) 53   Ht '5\' 6"'$  (1.676 m)   Wt 166 lb (75.3 kg)   BMI 26.79 kg/m  Constitutional: Pleasant,well-developed, female in no acute distress. HEENT: Normocephalic and atraumatic. Conjunctivae are normal. No scleral icterus. Cardiovascular: Normal rate, regular rhythm.  Pulmonary/chest: Effort normal and breath sounds normal. No wheezing, rales or rhonchi. Abdominal: Soft, nondistended, nontender. Bowel sounds active throughout. There are no masses palpable. No hepatomegaly. Extremities: No edema Neurological: Alert and oriented to person place and time. Skin: Skin is warm and dry. No rashes noted. Psychiatric: Normal mood and affect. Behavior is normal.  Labs 05/2021: Hep B surface antigen negative. Hep B surface antibody NR. Hep B core total antibody negative.  Labs 03/2022: CBC with low Hb of 11.7. Nml AST and ALT.   Labs 04/2022: CBC with low Hb of 11.8. Ferritin nml at 234. HbA1C 6.2%. Vit B12 and Vit D nml.   ASSESSMENT AND PLAN: Bloating Constipation Fatigue Patient presents to discuss dietary treatments for bloating, constipation, and fatigue. She states that following a Mediterranean diet and cutting out sugary foods has helped with her RA symptoms. Her bloating may be related to underlying  constipation. I recommended that she try to get 30 minutes of physical activity per day and to try eating kiwis to see if this helps with her bowel habits. Patient declined any pharmacologic therapies for constipation. Her last colonoscopy was reportedly in 2015. I do not have a report available for review, but patient states that her PCP reviewed the colonoscopy report, which recommended 10 year follow up for colon cancer screening. Patient is not interested in a colonoscopy at this time. - Walk 30 minutes per day - Eat 2 kiwis per day - Okay to try Activia yogurt - Recall for colonoscopy in 10/2023 for colon cancer screening - RTC PRN  Christia Reading, MD  I spent 63 minutes of time, including in depth chart review, independent review of results as outlined above, communicating results  with the patient directly, face-to-face time with the patient, coordinating care, ordering studies and medications as appropriate, and documentation.

## 2022-05-30 DIAGNOSIS — M79643 Pain in unspecified hand: Secondary | ICD-10-CM | POA: Diagnosis not present

## 2022-05-30 DIAGNOSIS — M79641 Pain in right hand: Secondary | ICD-10-CM | POA: Diagnosis not present

## 2022-05-30 DIAGNOSIS — M79672 Pain in left foot: Secondary | ICD-10-CM | POA: Diagnosis not present

## 2022-05-30 DIAGNOSIS — M069 Rheumatoid arthritis, unspecified: Secondary | ICD-10-CM | POA: Diagnosis not present

## 2022-05-30 DIAGNOSIS — M1990 Unspecified osteoarthritis, unspecified site: Secondary | ICD-10-CM | POA: Diagnosis not present

## 2022-05-30 DIAGNOSIS — M79642 Pain in left hand: Secondary | ICD-10-CM | POA: Diagnosis not present

## 2022-05-30 DIAGNOSIS — M25571 Pain in right ankle and joints of right foot: Secondary | ICD-10-CM | POA: Diagnosis not present

## 2022-05-30 DIAGNOSIS — M25562 Pain in left knee: Secondary | ICD-10-CM | POA: Diagnosis not present

## 2022-05-30 DIAGNOSIS — M25561 Pain in right knee: Secondary | ICD-10-CM | POA: Diagnosis not present

## 2022-05-30 DIAGNOSIS — M25572 Pain in left ankle and joints of left foot: Secondary | ICD-10-CM | POA: Diagnosis not present

## 2022-05-30 DIAGNOSIS — M79671 Pain in right foot: Secondary | ICD-10-CM | POA: Diagnosis not present

## 2022-06-11 ENCOUNTER — Ambulatory Visit: Payer: Medicare PPO | Attending: Orthopedic Surgery | Admitting: Occupational Therapy

## 2022-06-11 DIAGNOSIS — M79641 Pain in right hand: Secondary | ICD-10-CM | POA: Diagnosis not present

## 2022-06-11 DIAGNOSIS — M25641 Stiffness of right hand, not elsewhere classified: Secondary | ICD-10-CM | POA: Insufficient documentation

## 2022-06-11 DIAGNOSIS — M25642 Stiffness of left hand, not elsewhere classified: Secondary | ICD-10-CM | POA: Insufficient documentation

## 2022-06-11 DIAGNOSIS — M79642 Pain in left hand: Secondary | ICD-10-CM | POA: Diagnosis not present

## 2022-06-11 DIAGNOSIS — M6281 Muscle weakness (generalized): Secondary | ICD-10-CM | POA: Diagnosis not present

## 2022-06-11 DIAGNOSIS — M25541 Pain in joints of right hand: Secondary | ICD-10-CM | POA: Insufficient documentation

## 2022-06-11 NOTE — Therapy (Signed)
[facility] [address] [place], [address] Phone: [phone]   Fax:  [phone]  Occupational Therapy Treatment  Patient Details  Name: Jessica [name] MRN: [id number] Date of Birth: 10-14-1951 Referring Provider (OT): Louanne Skye   Encounter Date: 06/11/2022   OT End of Session - 06/11/22 1323     Visit Number 8    Number of Visits 8    Date for OT Re-Evaluation 06/11/22    OT Start Time 1034    OT Stop Time 1115    OT Time Calculation (min) 41 min    Activity Tolerance Patient tolerated treatment well    Behavior During Therapy [facility] for tasks assessed/performed             Past Medical History:  Diagnosis Date   Allergy    Breast cancer ([place]) 2000   Cancer ([place])    breast   Colon polyp    Coronary artery disease    GERD (gastroesophageal reflux disease)    History of blood transfusion    Hyperlipidemia    Migraine    history of migrarines, none as an adult   Personal history of radiation therapy 2000    Past Surgical History:  Procedure Laterality Date   ABDOMINAL HYSTERECTOMY     BREAST LUMPECTOMY Right 2000   BREAST SURGERY Right    calcifaction removed   BUNIONECTOMY Bilateral    COLONOSCOPY W/ POLYPECTOMY     CORONARY ARTERY BYPASS GRAFT N/A 01/05/2019   Procedure: CORONARY ARTERY BYPASS GRAFTING (CABG), using right leg saphenous endoscopic and open vein harvest, exploration left leg;  Surgeon: Gaye Pollack, MD;  Location: [place];  Service: Open Heart Surgery;  Laterality: N/A;   EYE SURGERY Bilateral    cataract   hemorrhoid     LEFT HEART CATH AND CORONARY ANGIOGRAPHY N/A 01/01/2019   Procedure: LEFT HEART CATH AND CORONARY ANGIOGRAPHY;  Surgeon: Jettie Booze, MD;  Location: [place] [facility];  Service: Cardiovascular;  Laterality: N/A;   STERNAL WIRES REMOVAL N/A 05/11/2019   Procedure: STERNAL WIRES REMOVAL;  Surgeon: Gaye Pollack, MD;  [facility];   Service: Thoracic;  Laterality: N/A;   TEE WITHOUT CARDIOVERSION N/A 01/05/2019   Procedure: TRANSESOPHAGEAL ECHOCARDIOGRAM (TEE);  Surgeon: Gaye Pollack, MD;  Location: [place];  Service: Open Heart Surgery;  Laterality: N/A;    There were no vitals filed for this visit.   Subjective Assessment - 06/11/22 1321     Subjective  I was doing great but then I started working out at [employer] in the gym a lot but done some machines with weight and about no more than a week ago my right thumb and wrist started hurting then I cannot bend my tip of my thumb it clicks constantly.  On the my left ring and pinky finger is really painful to.  I first had pain and then I had swelling and then that clicking started.  I did see the GI doctor and she was very happy with what I am doing.  And then I went for a second opinion with the rheumatologist in [place].    Pertinent History Patient with increased pain since February 23 and bilateral hands with increased inflammation as well as swelling pain and numbness.  Patient report 10/10 pain in bilateral hands.  Did see since then Dr. Posey Pronto the rheumatologist as well as orthopedist on 03/05/22 and referred to this OT with  bilateral hand rheumatoid arthritis and carpal tunnel symptoms in the right hand.  Checking ESR and CRP to assess inflammatory state. She is having new numbness and pain in her hands. The numbness is significantly improved s/p injection yesterday by Dr. Louanne Skye so we will wait to adjust medications for 1 week to see if this shot will help mre.  Patient on prednisone to will be finishing that up in the next few days.    Patient Stated Goals I want you to help me to make sure that my hands does not get worse again I want to strengthen the mobility so I can do the things that I like to do.    Currently in Pain? Yes    Pain Score 8     Pain Location Wrist   hand bilateral   Pain Orientation Right;Left    Pain Descriptors / Indicators  Aching;Tender;Tightness;Sore    Pain Type Chronic pain    Pain Onset 1 to 4 weeks ago    Pain Frequency Constant                [facility] OT Assessment - 06/11/22 0001       AROM   Right Wrist Extension 35 Degrees    Right Wrist Flexion 55 Degrees    Left Wrist Extension 55 Degrees    Left Wrist Flexion 70 Degrees      Right Hand AROM   R Thumb MCP 0-60 55 Degrees    R Thumb IP 0-80 20 Degrees   locking   R Thumb Radial ABduction/ADduction 0-55 44    R Thumb Palmar ABduction/ADduction 0-45 48    R Index  MCP 0-90 85 Degrees    R Index PIP 0-100 100 Degrees    R Long  MCP 0-90 85 Degrees    R Long PIP 0-100 95 Degrees    R Ring  MCP 0-90 90 Degrees    R Ring PIP 0-100 100 Degrees    R Little  MCP 0-90 90 Degrees    R Little PIP 0-100 95 Degrees      Left Hand AROM   L Long  MCP 0-90 90 Degrees    L Long PIP 0-100 95 Degrees    L Ring  MCP 0-90 80 Degrees    L Ring PIP 0-100 95 Degrees    L Little  MCP 0-90 90 Degrees    L Little PIP 0-100 95 Degrees                Patient arrived being not seen after a month.  In the meantime patient had a GI consult. Patient reports she went to the [facility] for some workouts and walking on the treadmill and it sounds like elliptical. About 6 weeks to 4 weeks ago she was doing great when last seen by me. This date patient return for OT with reports of increased pain in the right thumb and wrist about a week ago with increased swelling as well as increased stiffness pain and inability to flex the IP of the right thumb without clicking and popping. Also started having increased pain and swelling in the left fourth and fifth digits PIP  Upon assessment patient was negative for cubital tunnel on the left but tenderness over the A1 pulleys of fourth and fifth with decrease flexibility with increased pain and swelling.  Appear in the beginning stages of trigger fingers. Right wrist flexion and extension decreased with increased pain.   Patient is tender  over the distal radius head but negative for [facility] Increased pain over A1 pulley of right thumb with decreased flexibility compared to before in the thumb IP and increased pain with a lot of clicking and popping. Patient can benefit from skilled OT services but would need a new order for eval and treat for bilateral hand pain with use of iontophoresis with dexamethasone. Patient in the meantime can do contrast as well as wearing her splints with pain-free active range of motion and ice massage over the A1 pulley of the right thumb and the left fourth and fifth. Reviewed with patient again joint protection as well as modifications to enlarged grips as well as use larger joints. Await new order for evaluation.                OT Education - 06/11/22 1323     Education Details Reassess and educated patient on symptoms and plan of care changes    Person(s) Educated Patient    Methods Explanation;Demonstration;Tactile cues;Verbal cues;Handout    Comprehension Verbal cues required;Returned demonstration;Verbalized understanding                 OT Long Term Goals - 04/16/22 1216       OT LONG TERM GOAL #1   Title Patient to be independent in home program to increase active range of motion to within normal limits with decreased pain and edema in bilateral hands without being on prednisone    Status Achieved      OT LONG TERM GOAL #2   Title Patient reports 3 joint protection principles or adaptive equipment to increase ease or use of hands and ADLs    Baseline Reviewed again with patient joint protection this date patient has some pain over FCR on the right.  Using a wrist flexion with large joint.  Fitted with a wrist brace today and reviewed    Time 6    Period Weeks    Status On-going    Target Date 05/28/22      OT LONG TERM GOAL #3   Title Patient's grip and prehension strength increase without pain to about the same then she was February of  last year    Baseline Grip strength on the right 52, left 41.  Lateral pinch right 18 left 14 pounds.  Three-point pinch 11 pounds in the right left 9 pounds still in range for her age but significantly decreased compared to last year.  Still decreased compared to prior    Time 6    Period Weeks    Status On-going    Target Date 05/28/22      OT LONG TERM GOAL #4   Title Patient to verbalize several resources in the community where she can do exercise to maintain her flexibility mobility and strength    Baseline Patient participated in arthritis lending series.  Has knowledge of [employer] as well as tai chi and [facility] exercises    Status Achieved                   Plan - 06/11/22 1324     Clinical Impression Statement Patient referred to OT with diagnosis of bilateral rheumatoid arthritis as well as carpal tunnel symptoms on the right.  Patient made great progress until about 6 weeks ago.  Patient was not seen for about a month.  Patient returned this date with reports of the last week increased pain in the right thumb and wrist, increased swelling and  some locking and triggering of the right thumb.  And then increased pain and swelling in the left fifth and fourth digit.  Patient reports she started going to the [facility] and done it sounds like the elliptical as well as the machines with weights.  At this time it appears patient irritated the right thumb compensated with wrist flexion irritating with increased swelling and trigger thumb with decreased IP flexion.  Left negative cubital tunnel tenderness and [place].  But tenderness over A1 pulley at fourth and fifth digit with increased swelling and pain at the digit but no triggering.  Patient did see a GI doctor in the meantime as well as a second opinion with a rheumatologist in [place].  Explained to patient that OT can use iontophoresis with dexamethasone for the right thumb as well as left fourth and fifth digit.  But would  need a new order for evaluation and treatment.  Also again explained to patient that the gym with the weight machines is maybe not the best for her but like in the past tai chi as well as exercise in the water will be great for her.  Patient can benefit from skilled OT services but needs a new order.   Patient limited in use of bilateral hands and ADLs and IADLs by pain, swelling and inflammation.  Patient can benefit from skilled OT services to increase independence in use of bilateral hands.    OT Occupational Profile and History Problem Focused Assessment - Including review of records relating to presenting problem    Occupational performance deficits (Please refer to evaluation for details): ADL's;IADL's;Rest and Sleep;Play;Leisure;Social Participation    Body Structure / Function / Physical Skills ADL;Decreased knowledge of use of DME;Strength;Pain;Edema;UE functional use;IADL;ROM;Flexibility    Rehab Potential Good    Clinical Decision Making Several treatment options, min-mod task modification necessary    Comorbidities Affecting Occupational Performance: May have comorbidities impacting occupational performance    Modification or Assistance to Complete Evaluation  No modification of tasks or assist necessary to complete eval    OT Frequency 2x / week    OT Duration 6 weeks    OT Treatment/Interventions Self-care/ADL training;Moist Heat;DME and/or AE instruction;Splinting;Contrast Bath;Therapeutic exercise;Paraffin;Patient/family education;Manual Therapy;Iontophoresis    Consulted and Agree with Plan of Care Patient             Patient will benefit from skilled therapeutic intervention in order to improve the following deficits and impairments:   Body Structure / Function / Physical Skills: ADL, Decreased knowledge of use of DME, Strength, Pain, Edema, UE functional use, IADL, ROM, Flexibility       Visit Diagnosis: Muscle weakness (generalized) - Plan: Ot plan of care  cert/re-cert  Pain in right hand - Plan: Ot plan of care cert/re-cert  Pain in left hand - Plan: Ot plan of care cert/re-cert  Stiffness of left hand, not elsewhere classified - Plan: Ot plan of care cert/re-cert  Stiffness of right hand, not elsewhere classified - Plan: Ot plan of care cert/re-cert    Problem List Patient Active Problem List   Diagnosis Date Noted   Rheumatoid arthritis involving multiple sites ([place]) 03/08/2022   Insomnia 03/08/2022   Dysphagia 10/31/2021   Lymphadenopathy of head and neck 06/16/2021   Long-term use of immunosuppressant medication 05/31/2021   Eye drainage 03/17/2021   Fatigue 01/27/2021   Chronic pain of both shoulders 06/01/2020   Swelling of both hands 06/01/2020   Cervical disc disorder with radiculopathy of cervical region 05/24/2020  Post inflammatory hypopigmentation 12/30/2019   Claudication in peripheral vascular disease ([place]) 12/22/2019   Arthritis of carpometacarpal (CMC) joint of left thumb 10/28/2019   Hand arthritis 10/01/2019   Statin myopathy 08/30/2019   SOB (shortness of breath) on exertion 06/19/2019   Hyperlipidemia 04/08/2019   S/P CABG x 5 01/05/2019   Left leg pain 09/30/2018   Degenerative joint disease of low back 01/09/2017   Routine general medical examination at a health care facility 10/02/2016   GERD (gastroesophageal reflux disease) 07/28/2016   H/O malignant neoplasm of breast 06/19/2016    Rosalyn Gess, OTR/L,CLT 06/11/2022, 1:30 PM  [facility] [address], [place], [address] Phone: [phone]   Fax:  [phone]  Name: Jessica [name] MRN: [id number] Date of Birth: 02/01/1951

## 2022-06-13 ENCOUNTER — Encounter: Payer: Self-pay | Admitting: Occupational Therapy

## 2022-06-13 ENCOUNTER — Ambulatory Visit: Payer: Medicare PPO | Admitting: Occupational Therapy

## 2022-06-13 DIAGNOSIS — M25642 Stiffness of left hand, not elsewhere classified: Secondary | ICD-10-CM | POA: Diagnosis not present

## 2022-06-13 DIAGNOSIS — M79641 Pain in right hand: Secondary | ICD-10-CM

## 2022-06-13 DIAGNOSIS — M6281 Muscle weakness (generalized): Secondary | ICD-10-CM

## 2022-06-13 DIAGNOSIS — M25541 Pain in joints of right hand: Secondary | ICD-10-CM

## 2022-06-13 DIAGNOSIS — M79642 Pain in left hand: Secondary | ICD-10-CM

## 2022-06-13 DIAGNOSIS — M25641 Stiffness of right hand, not elsewhere classified: Secondary | ICD-10-CM

## 2022-06-13 NOTE — Therapy (Signed)
[facility] [address], [place], [address] Phone: [phone]   Fax:  [phone]  Occupational Therapy Evaluation  Patient Details  Name: Jessica [name] MRN: [id number] Date of Birth: 11/03/1950 Referring Provider (OT): Dr Dossie Der   Encounter Date: 06/13/2022   OT End of Session - 06/13/22 2038     Visit Number 1    Number of Visits 12    Date for OT Re-Evaluation 08/08/22    OT Start Time 1409    OT Stop Time 1512    OT Time Calculation (min) 63 min    Activity Tolerance Patient tolerated treatment well    Behavior During Therapy [facility] for tasks assessed/performed             Past Medical History:  Diagnosis Date   Allergy    Breast cancer ([place]) 2000   Cancer ([place])    breast   Colon polyp    Coronary artery disease    GERD (gastroesophageal reflux disease)    History of blood transfusion    Hyperlipidemia    Migraine    history of migrarines, none as an adult   Personal history of radiation therapy 2000    Past Surgical History:  Procedure Laterality Date   ABDOMINAL HYSTERECTOMY     BREAST LUMPECTOMY Right 2000   BREAST SURGERY Right    calcifaction removed   BUNIONECTOMY Bilateral    COLONOSCOPY W/ POLYPECTOMY     CORONARY ARTERY BYPASS GRAFT N/A 01/05/2019   Procedure: CORONARY ARTERY BYPASS GRAFTING (CABG), using right leg saphenous endoscopic and open vein harvest, exploration left leg;  Surgeon: Gaye Pollack, MD;  Location: [place];  Service: Open Heart Surgery;  Laterality: N/A;   EYE SURGERY Bilateral    cataract   hemorrhoid     LEFT HEART CATH AND CORONARY ANGIOGRAPHY N/A 01/01/2019   Procedure: LEFT HEART CATH AND CORONARY ANGIOGRAPHY;  Surgeon: Jettie Booze, MD;  Location: [facility];  Service: Cardiovascular;  Laterality: N/A;   STERNAL WIRES REMOVAL N/A 05/11/2019   Procedure: STERNAL WIRES REMOVAL;  Surgeon: Gaye Pollack, MD;  Location: [facility];   Service: Thoracic;  Laterality: N/A;   TEE WITHOUT CARDIOVERSION N/A 01/05/2019   Procedure: TRANSESOPHAGEAL ECHOCARDIOGRAM (TEE);  Surgeon: Gaye Pollack, MD;  Location: [place];  Service: Open Heart Surgery;  Laterality: N/A;    There were no vitals filed for this visit.   Subjective Assessment - 06/13/22 2027     Subjective  I was doing good until the last couple of weeks I was working out with some of the weight machines at the [facility].  Increased right thumb pain, with increased swelling and then clicking with attempting of bending type of thumb.  Increased stiffness.  As well as increased pain and stiffness in my left ring and pinky.  I did see new rheumatologist. Going to start the medicine    Pertinent History Patient with increased pain since February 23 and bilateral hands with increased inflammation as well as swelling pain and numbness.  Patient report 10/10 pain in bilateral hands.  Did see since then Dr. Posey Pronto the rheumatologist as well as orthopedist on 03/05/22 and referred to this OT with bilateral hand rheumatoid arthritis and carpal tunnel symptoms in the right hand.  Checking ESR and CRP to assess inflammatory state. She is having new numbness and pain in her hands. The numbness is significantly improved s/p injection yesterday by  Dr. Louanne Skye so we will wait to adjust medications for 1 week to see if this shot will help mre.  Patient was seen by a new rheumatologist.  Is going to continue the methotrexate but starting with a new arthritis medication.  Patient returned this week with increase pain and stiffness in left fourth and fifth digit with tenderness over A1 pulley as well as the right thumb trigger thumb with tenderness and pain over A1 pulley order for OT to eval and treat as well as.    Currently in Pain? Yes    Pain Score 9     Pain Location Hand    Pain Orientation Right;Left    Pain Descriptors / Indicators Aching;Tender;Tightness;Sore    Pain Type Acute pain;Chronic pain     Pain Onset More than a month ago    Pain Frequency Intermittent               [facility] OT Assessment - 06/13/22 0001       Assessment   Medical Diagnosis Arthritis of both hands as well as trigger thumb and pain with left fourth and fifth    Referring Provider (OT) Dr Dossie Der    Onset Date/Surgical Date 05/23/22    Hand Dominance Right    Prior Therapy Earlier this year for arthritis bilateral hands and carpal tunnel      Home  Environment   Lives With Spouse      Prior Function   Vocation Retired    Leisure Retired Radio producer.  Working out in the gym 3 times a week, housework      AROM   Right Wrist Extension 35 Degrees    Right Wrist Flexion 52 Degrees      Right Hand AROM   R Thumb MCP 0-60 60 Degrees   -25   R Thumb IP 0-80 20 Degrees   locking - without blocing 10   R Index  MCP 0-90 80 Degrees    R Index PIP 0-100 90 Degrees    R Long  MCP 0-90 80 Degrees    R Long PIP 0-100 95 Degrees    R Ring  MCP 0-90 85 Degrees    R Ring PIP 0-100 100 Degrees    R Little  MCP 0-90 85 Degrees    R Little PIP 0-100 95 Degrees      Left Hand AROM   L Index  MCP 0-90 90 Degrees    L Long  MCP 0-90 90 Degrees    L Ring  MCP 0-90 90 Degrees    L Ring PIP 0-100 90 Degrees    L Little  MCP 0-90 90 Degrees    L Little PIP 0-100 90 Degrees                      OT Treatments/Exercises (OP) - 06/13/22 0001       Iontophoresis   Type of Iontophoresis Dexamethasone    Location R thumb A1pulley, L 4th and 5th A1pulley    Dose L 4th and 5th A1pulley, R thumb A1pulley 2.0 current    Time 20      RUE Contrast Bath   Time 8 minutes    Comments Right hand prior to soft tissue decrease inflammation and pain      LUE Contrast Bath   Time 8 minutes    Comments Decrease inflammation and pain prior to soft tissue  Patient return after not being seen for a while.  Patient was seen before for rheumatoid arthritis as well as carpal tunnel symptoms.   Patient return after working out in [employer] on [employer] as well as elliptical.  Patient report increased pain in the right thumb as well as left fourth and fifth digits.  Patient had a new order for OT to evaluate and treat as well as using ionto.  For right thumb as well as left fourth and fifth digit.  Patient tolerating ionto well removed patch today to do a skin check patient no issues.  Patient to do contrast to 3 times a day on bilateral hands with pain-free active range of motion of tendon glides focusing on MC as well as intrinsic a fist avoiding composite. As well as bilateral thumb palmar radial abduction 10 reps pain-free As well as wrist flexion extension and radial ulnar deviation pain-free. Patient can wear thumb spica splint on right to decrease triggering of thumb IP. As well as continue joint protection principles and modifications.           OT Education - 06/13/22 2038     Education Details Findings of evaluation and home program changes as well as ionto    Person(s) Educated Patient    Methods Explanation;Demonstration;Tactile cues;Verbal cues;Handout    Comprehension Verbal cues required;Returned demonstration;Verbalized understanding              OT Short Term Goals - 06/13/22 2042       OT SHORT TERM GOAL #1   Title Pt to be indep with splinting schedule to decrease pain in bilat hands.    Baseline Patient to wear thumb spica splint on right hand to decrease pain in thumb and wrist and can also do Band-Aid over IP to decrease pain and triggering.    Time 4    Period Weeks    Status New    Target Date 07/12/22               OT Long Term Goals - 06/13/22 2043       OT LONG TERM GOAL #1   Title Patient to be independent in home program to increase active range of motion to within normal limits with decreased pain and edema in bilateral hands    Baseline Patient is active range of motion decreased in right thumb IP flexion and wrist.   With increased pain 9/10 at A1 pulley of thumb.  7/10 pain and tenderness over A1 pulley of left fourth and fifth.  With decreased flexion of fourth and fifth digits    Time 4    Period Weeks    Status New    Target Date 07/11/22      OT LONG TERM GOAL #2   Title Patient reports 3 joint protection principles or adaptive equipment to increase ease or use of hands and ADLs    Baseline Patient continues to need reminders meant and education for joint protection that was educated in the past-patient had a flareup the last 3 weeks after working out in [employer] on [employer].    Time 6    Period Weeks    Status New    Target Date 07/25/22      OT LONG TERM GOAL #3   Title Patient's grip and prehension strength increase without pain to about the same then she was February of last year    Baseline Did not assess patient's grip and prehension patient had  a flareup the last 3 weeks with trigger thumb on the right decrease flexibility increased triggering and 7/10 pain over A1 pulley.  Left increase edema and pain in fourth and fifth with A1 pulleys 7/10 tenderness    Time 8    Period Weeks    Target Date 08/08/22                   Plan - 06/13/22 2039     Clinical Impression Statement Patient was seen by this OT previously this year for a diagnosis of bilateral rheumatoid arthritis as well as carpal tunnel symptoms with the right worse than the left.  Patient showed great progress but had a couple of flareups throughout that time.  Patient went for second opinion with a rheumatologist in [place].  Patient referred to this OT this date with right thumb trigger finger increased swelling and pain and decreased IP flexion starting about 3 weeks ago.  As well as left fourth and fifth digit increase edema and pain with decreased motion.  Patient reported started about 3 weeks ago.  Patient started working out at [employer] on machines as well as elliptical.  Patient tender 7 out of 10 over  left fourth and fifth A1 pulley.  With decreased flexion.  Right thumb patient with increased pain decreased IP flexion with 9/10 pain over A1 pulley.  Patient showing clicking of attempts of range of motion of thumb IP.  Did not assess strength.  Patient limited in functional use of bilateral hands in ADLs and IADLs.  Patient can benefit from skilled OT services to decrease pain, edema increase motion and strength to return to prior level of function.    OT Occupational Profile and History Problem Focused Assessment - Including review of records relating to presenting problem    Occupational performance deficits (Please refer to evaluation for details): ADL's;IADL's;Rest and Sleep;Play;Leisure;Social Participation    Body Structure / Function / Physical Skills ADL;Decreased knowledge of use of DME;Strength;Pain;Edema;UE functional use;IADL;ROM;Flexibility    Rehab Potential Fair    Clinical Decision Making Several treatment options, min-mod task modification necessary    Comorbidities Affecting Occupational Performance: May have comorbidities impacting occupational performance    Modification or Assistance to Complete Evaluation  No modification of tasks or assist necessary to complete eval    OT Frequency 2x / week    OT Duration 8 weeks    OT Treatment/Interventions Self-care/ADL training;Moist Heat;DME and/or AE instruction;Splinting;Contrast Bath;Therapeutic exercise;Paraffin;Patient/family education;Manual Therapy;Iontophoresis    Consulted and Agree with Plan of Care Patient             Patient will benefit from skilled therapeutic intervention in order to improve the following deficits and impairments:   Body Structure / Function / Physical Skills: ADL, Decreased knowledge of use of DME, Strength, Pain, Edema, UE functional use, IADL, ROM, Flexibility       Visit Diagnosis: Pain in right hand  Pain in left hand  Stiffness of left hand, not elsewhere classified  Stiffness of  right hand, not elsewhere classified  Pain in thumb joint with movement of right hand  Muscle weakness (generalized)    Problem List Patient Active Problem List   Diagnosis Date Noted   Rheumatoid arthritis involving multiple sites ([place]) 03/08/2022   Insomnia 03/08/2022   Dysphagia 10/31/2021   Lymphadenopathy of head and neck 06/16/2021   Long-term use of immunosuppressant medication 05/31/2021   Eye drainage 03/17/2021   Fatigue 01/27/2021   Chronic pain of both shoulders  06/01/2020   Swelling of both hands 06/01/2020   Cervical disc disorder with radiculopathy of cervical region 05/24/2020   Post inflammatory hypopigmentation 12/30/2019   Claudication in peripheral vascular disease ([place]) 12/22/2019   Arthritis of carpometacarpal (CMC) joint of left thumb 10/28/2019   Hand arthritis 10/01/2019   Statin myopathy 08/30/2019   SOB (shortness of breath) on exertion 06/19/2019   Hyperlipidemia 04/08/2019   S/P CABG x 5 01/05/2019   Left leg pain 09/30/2018   Degenerative joint disease of low back 01/09/2017   Routine general medical examination at a health care facility 10/02/2016   GERD (gastroesophageal reflux disease) 07/28/2016   H/O malignant neoplasm of breast 06/19/2016    Rosalyn Gess, OTR/L,CLT 06/13/2022, 8:46 PM  [facility] [address] [place], [address] Phone: [phone]   Fax:  [phone]  Name: Rosalind Guido MRN: [id number] Date of Birth: 02/14/1951

## 2022-06-13 NOTE — Addendum Note (Signed)
Addended by: Rosalyn Gess on: 06/13/2022 08:50 PM   Modules accepted: Orders

## 2022-06-18 ENCOUNTER — Ambulatory Visit: Payer: Medicare PPO | Admitting: Occupational Therapy

## 2022-06-18 DIAGNOSIS — M79641 Pain in right hand: Secondary | ICD-10-CM | POA: Diagnosis not present

## 2022-06-18 DIAGNOSIS — M25641 Stiffness of right hand, not elsewhere classified: Secondary | ICD-10-CM

## 2022-06-18 DIAGNOSIS — M25541 Pain in joints of right hand: Secondary | ICD-10-CM

## 2022-06-18 DIAGNOSIS — M6281 Muscle weakness (generalized): Secondary | ICD-10-CM

## 2022-06-18 DIAGNOSIS — M25642 Stiffness of left hand, not elsewhere classified: Secondary | ICD-10-CM | POA: Diagnosis not present

## 2022-06-18 DIAGNOSIS — M79642 Pain in left hand: Secondary | ICD-10-CM

## 2022-06-18 NOTE — Therapy (Signed)
[facility] [address], [place], [address] Phone: [phone]   Fax:  [phone]  Occupational Therapy Treatment  Patient Details  Name: Jessica [name] MRN: [id number] Date of Birth: [birth year] Referring Provider (OT): Dr Dossie Der   Encounter Date: 06/18/2022   OT End of Session - 06/18/22 1346     Visit Number 2    Number of Visits 12    Date for OT Re-Evaluation 08/08/22    OT Start Time 1347    OT Stop Time 1440    OT Time Calculation (min) 53 min    Activity Tolerance Patient tolerated treatment well    Behavior During Therapy [facility] for tasks assessed/performed             Past Medical History:  Diagnosis Date   Allergy    Breast cancer ([place]) 2000   Cancer ([place])    breast   Colon polyp    Coronary artery disease    GERD (gastroesophageal reflux disease)    History of blood transfusion    Hyperlipidemia    Migraine    history of migrarines, none as an adult   Personal history of radiation therapy 2000    Past Surgical History:  Procedure Laterality Date   ABDOMINAL HYSTERECTOMY     BREAST LUMPECTOMY Right 2000   BREAST SURGERY Right    calcifaction removed   BUNIONECTOMY Bilateral    COLONOSCOPY W/ POLYPECTOMY     CORONARY ARTERY BYPASS GRAFT N/A 01/05/2019   Procedure: CORONARY ARTERY BYPASS GRAFTING (CABG), using right leg saphenous endoscopic and open vein harvest, exploration left leg;  Surgeon: Gaye Pollack, MD;  Location: [place];  Service: Open Heart Surgery;  Laterality: N/A;   EYE SURGERY Bilateral    cataract   hemorrhoid     LEFT HEART CATH AND CORONARY ANGIOGRAPHY N/A 01/01/2019   Procedure: LEFT HEART CATH AND CORONARY ANGIOGRAPHY;  Surgeon: Jettie Booze, MD;  Location: [facility] [facility];  Service: Cardiovascular;  Laterality: N/A;   STERNAL WIRES REMOVAL N/A 05/11/2019   Procedure: STERNAL WIRES REMOVAL;  Surgeon: Gaye Pollack, MD;  [facility];   Service: Thoracic;  Laterality: N/A;   TEE WITHOUT CARDIOVERSION N/A 01/05/2019   Procedure: TRANSESOPHAGEAL ECHOCARDIOGRAM (TEE);  Surgeon: Gaye Pollack, MD;  Location: [facility];  Service: Open Heart Surgery;  Laterality: N/A;    There were no vitals filed for this visit.   Subjective Assessment - 06/18/22 1345     Subjective  Less pain in the L ring and pinkie but R thumb still stiff and sore    Pertinent History Patient with increased pain since February 23 and bilateral hands with increased inflammation as well as swelling pain and numbness.  Patient report 10/10 pain in bilateral hands.  Did see since then Dr. Posey Pronto the rheumatologist as well as orthopedist on 03/05/22 and referred to this OT with bilateral hand rheumatoid arthritis and carpal tunnel symptoms in the right hand.  Checking ESR and CRP to assess inflammatory state. She is having new numbness and pain in her hands. The numbness is significantly improved s/p injection yesterday by Dr. Louanne Skye so we will wait to adjust medications for 1 week to see if this shot will help mre.  Patient was seen by a new rheumatologist.  Is going to continue the methotrexate but starting with a new arthritis medication.  Patient returned this week with increase pain and stiffness in left  fourth and fifth digit with tenderness over A1 pulley as well as the right thumb trigger thumb with tenderness and pain over A1 pulley order for OT to eval and treat as well as.    Patient Stated Goals I want you to help me to make sure that my hands does not get worse again I want to strengthen the mobility so I can do the things that I like to do.    Currently in Pain? Yes    Pain Score --   5-7/10   Pain Location --   R thumb and L 4th and 5th A1pulley   Pain Orientation Right;Left    Pain Descriptors / Indicators Tender    Pain Type Acute pain    Pain Onset More than a month ago    Pain Frequency Intermittent                [facility] OT Assessment - 06/18/22  0001       Right Hand AROM   R Thumb MCP 0-60 60 Degrees    R Thumb IP 0-80 --   30 blocking PROM 45   R Index  MCP 0-90 80 Degrees    R Index PIP 0-100 95 Degrees    R Long  MCP 0-90 80 Degrees    R Long PIP 0-100 95 Degrees    R Ring  MCP 0-90 90 Degrees    R Ring PIP 0-100 100 Degrees    R Little  MCP 0-90 85 Degrees    R Little PIP 0-100 95 Degrees      Left Hand AROM   L Index  MCP 0-90 90 Degrees    L Long  MCP 0-90 90 Degrees    L Long PIP 0-100 100 Degrees    L Ring  MCP 0-90 90 Degrees    L Ring PIP 0-100 95 Degrees    L Little  MCP 0-90 90 Degrees    L Little PIP 0-100 90 Degrees                   Skin check done prior and tolerate well - no issues- pt to keep medication patch on for hour after wards    OT Treatments/Exercises (OP) - 06/18/22 0001       Iontophoresis   Type of Iontophoresis Dexamethasone    Location R thumb A1pulley, L 4th and 5th A1pulley    Dose small R thumb , med on R 4th and 5th A1pulley - 2.0    Time 20      RUE Contrast Bath   Time 8 minutes    Comments prior to soft tissue and PROM      LUE Contrast Bath   Time 8 minutes    Comments decrease edema and pain - and PROM           Patient to do contrast to 3 times a day on bilateral hands with pain-free active range of motion of tendon glides focusing on MC as well as intrinsic a fist avoiding composite on L mostly  Had less pain and increase motion today   As well as bilateral thumb palmar radial abduction 10 reps pain-free As well as wrist flexion extension and radial ulnar deviation pain-free. Patient can wear thumb spica splint on right to decrease triggering of thumb IP. Done this date after manual for thumb PA and RA -and graston over volar wrist and palm - digits including thumbs prior to PROM for  R thumb IP and MC - composite - without triggering  Pt can do gentle PROM if no triggering and pain free  After contrast  As well as continue joint protection  principles and modifications.          OT Education - 06/18/22 1345     Education Details Progress and changes to home program    Person(s) Educated Patient    Methods Explanation;Demonstration;Tactile cues;Verbal cues;Handout    Comprehension Verbal cues required;Returned demonstration;Verbalized understanding              OT Short Term Goals - 06/13/22 2042       OT SHORT TERM GOAL #1   Title Pt to be indep with splinting schedule to decrease pain in bilat hands.    Baseline Patient to wear thumb spica splint on right hand to decrease pain in thumb and wrist and can also do Band-Aid over IP to decrease pain and triggering.    Time 4    Period Weeks    Status New    Target Date 07/12/22               OT Long Term Goals - 06/13/22 2043       OT LONG TERM GOAL #1   Title Patient to be independent in home program to increase active range of motion to within normal limits with decreased pain and edema in bilateral hands    Baseline Patient is active range of motion decreased in right thumb IP flexion and wrist.  With increased pain 9/10 at A1 pulley of thumb.  7/10 pain and tenderness over A1 pulley of left fourth and fifth.  With decreased flexion of fourth and fifth digits    Time 4    Period Weeks    Status New    Target Date 07/11/22      OT LONG TERM GOAL #2   Title Patient reports 3 joint protection principles or adaptive equipment to increase ease or use of hands and ADLs    Baseline Patient continues to need reminders meant and education for joint protection that was educated in the past-patient had a flareup the last 3 weeks after working out in [employer] on [employer].    Time 6    Period Weeks    Status New    Target Date 07/25/22      OT LONG TERM GOAL #3   Title Patient's grip and prehension strength increase without pain to about the same then she was February of last year    Baseline Did not assess patient's grip and prehension patient had a  flareup the last 3 weeks with trigger thumb on the right decrease flexibility increased triggering and 7/10 pain over A1 pulley.  Left increase edema and pain in fourth and fifth with A1 pulleys 7/10 tenderness    Time 8    Period Weeks    Target Date 08/08/22                   Plan - 06/18/22 1346     Clinical Impression Statement Patient was seen by this OT previously this year for a diagnosis of bilateral rheumatoid arthritis as well as carpal tunnel symptoms with the right worse than the left.  Patient showed great progress but had a couple of flareups throughout that time.  Patient went for second opinion with a rheumatologist in [place].  Patient referred to  OT this  time with right thumb trigger finger  increased swelling and pain and decreased IP flexion starting about 3 weeks ago.  As well as left fourth and fifth digit increase edema and pain with decreased motion.  Patient started working out at [employer] on machines as well as elliptical.  Patient tender 7 out of 10 over left fourth and fifth A1 pulley.  With decreased flexion.  Right thumb patient with increased pain decreased IP flexion with 9/10 pain over A1 pulley at eval - this date pt decrease pain 5/10 at L and R 7/10 - increase PROM and AAROM.  Patient showing less  clicking in session at thumb IP on the R .  Did not assess strength.  Patient limited in functional use of bilateral hands in ADLs and IADLs.  Patient can benefit from skilled OT services to decrease pain, edema increase motion and strength to return to prior level of function.    OT Occupational Profile and History Problem Focused Assessment - Including review of records relating to presenting problem    Occupational performance deficits (Please refer to evaluation for details): ADL's;IADL's;Rest and Sleep;Play;Leisure;Social Participation    Body Structure / Function / Physical Skills ADL;Decreased knowledge of use of DME;Strength;Pain;Edema;UE functional  use;IADL;ROM;Flexibility    Rehab Potential Fair    Clinical Decision Making Several treatment options, min-mod task modification necessary    Comorbidities Affecting Occupational Performance: May have comorbidities impacting occupational performance    Modification or Assistance to Complete Evaluation  No modification of tasks or assist necessary to complete eval    OT Frequency 2x / week    OT Duration 8 weeks    OT Treatment/Interventions Self-care/ADL training;Moist Heat;DME and/or AE instruction;Splinting;Contrast Bath;Therapeutic exercise;Paraffin;Patient/family education;Manual Therapy;Iontophoresis    Consulted and Agree with Plan of Care Patient             Patient will benefit from skilled therapeutic intervention in order to improve the following deficits and impairments:   Body Structure / Function / Physical Skills: ADL, Decreased knowledge of use of DME, Strength, Pain, Edema, UE functional use, IADL, ROM, Flexibility       Visit Diagnosis: Pain in right hand  Pain in left hand  Stiffness of left hand, not elsewhere classified  Stiffness of right hand, not elsewhere classified  Pain in thumb joint with movement of right hand  Muscle weakness (generalized)    Problem List Patient Active Problem List   Diagnosis Date Noted   Rheumatoid arthritis involving multiple sites ([place]) 03/08/2022   Insomnia 03/08/2022   Dysphagia 10/31/2021   Lymphadenopathy of head and neck 06/16/2021   Long-term use of immunosuppressant medication 05/31/2021   Eye drainage 03/17/2021   Fatigue 01/27/2021   Chronic pain of both shoulders 06/01/2020   Swelling of both hands 06/01/2020   Cervical disc disorder with radiculopathy of cervical region 05/24/2020   Post inflammatory hypopigmentation 12/30/2019   Claudication in peripheral vascular disease ([place]) 12/22/2019   Arthritis of carpometacarpal (CMC) joint of left thumb 10/28/2019   Hand arthritis 10/01/2019   Statin myopathy  08/30/2019   SOB (shortness of breath) on exertion 06/19/2019   Hyperlipidemia 04/08/2019   S/P CABG x 5 01/05/2019   Left leg pain 09/30/2018   Degenerative joint disease of low back 01/09/2017   Routine general medical examination at a health care facility 10/02/2016   GERD (gastroesophageal reflux disease) 07/28/2016   H/O malignant neoplasm of breast 06/19/2016    Rosalyn Gess, OTR/L,CLT 06/18/2022, 3:47 PM  [facility] [address] [place], [address] Phone: [phone]   Fax:  [phone]  Name: Jerline Linzy MRN: [id number] Date of Birth: 07-27-1951

## 2022-06-20 ENCOUNTER — Ambulatory Visit: Payer: Medicare PPO | Admitting: Occupational Therapy

## 2022-06-20 DIAGNOSIS — M79641 Pain in right hand: Secondary | ICD-10-CM

## 2022-06-20 DIAGNOSIS — M6281 Muscle weakness (generalized): Secondary | ICD-10-CM

## 2022-06-20 DIAGNOSIS — M25541 Pain in joints of right hand: Secondary | ICD-10-CM

## 2022-06-20 DIAGNOSIS — M25642 Stiffness of left hand, not elsewhere classified: Secondary | ICD-10-CM

## 2022-06-20 DIAGNOSIS — M79642 Pain in left hand: Secondary | ICD-10-CM | POA: Diagnosis not present

## 2022-06-20 DIAGNOSIS — M25641 Stiffness of right hand, not elsewhere classified: Secondary | ICD-10-CM

## 2022-06-20 NOTE — Therapy (Signed)
[facility] [address] [place], [address] Phone: [phone]   Fax:  [phone]  Occupational Therapy Treatment  Patient Details  Name: Jessica [name] MRN: [id number] Date of Birth: [birth year] Referring Provider (OT): Dr Dossie Der   Encounter Date: 06/20/2022   OT End of Session - 06/20/22 1406     Visit Number 3    Number of Visits 12    Date for OT Re-Evaluation 08/08/22    OT Start Time 1400    OT Stop Time 1457    OT Time Calculation (min) 57 min    Activity Tolerance Patient tolerated treatment well    Behavior During Therapy [facility] for tasks assessed/performed             Past Medical History:  Diagnosis Date   Allergy    Breast cancer ([place]) 2000   Cancer ([place])    breast   Colon polyp    Coronary artery disease    GERD (gastroesophageal reflux disease)    History of blood transfusion    Hyperlipidemia    Migraine    history of migrarines, none as an adult   Personal history of radiation therapy 2000    Past Surgical History:  Procedure Laterality Date   ABDOMINAL HYSTERECTOMY     BREAST LUMPECTOMY Right 2000   BREAST SURGERY Right    calcifaction removed   BUNIONECTOMY Bilateral    COLONOSCOPY W/ POLYPECTOMY     CORONARY ARTERY BYPASS GRAFT N/A 01/05/2019   Procedure: CORONARY ARTERY BYPASS GRAFTING (CABG), using right leg saphenous endoscopic and open vein harvest, exploration left leg;  Surgeon: Gaye Pollack, MD;  Location: [place];  Service: Open Heart Surgery;  Laterality: N/A;   EYE SURGERY Bilateral    cataract   hemorrhoid     LEFT HEART CATH AND CORONARY ANGIOGRAPHY N/A 01/01/2019   Procedure: LEFT HEART CATH AND CORONARY ANGIOGRAPHY;  Surgeon: Jettie Booze, MD;  Location: [place];  Service: Cardiovascular;  Laterality: N/A;   STERNAL WIRES REMOVAL N/A 05/11/2019   Procedure: STERNAL WIRES REMOVAL;  Surgeon: Gaye Pollack, MD;  [facility];   Service: Thoracic;  Laterality: N/A;   TEE WITHOUT CARDIOVERSION N/A 01/05/2019   Procedure: TRANSESOPHAGEAL ECHOCARDIOGRAM (TEE);  Surgeon: Gaye Pollack, MD;  Location: [place];  Service: Open Heart Surgery;  Laterality: N/A;    There were no vitals filed for this visit.   Subjective Assessment - 06/20/22 1406     Subjective  DOing okay - still tender and some pain - thumb moving little better- but I have some chills- read it could be side affect of new arthritis medication    Pertinent History Patient with increased pain since February 23 and bilateral hands with increased inflammation as well as swelling pain and numbness.  Patient report 10/10 pain in bilateral hands.  Did see since then Dr. Posey Pronto the rheumatologist as well as orthopedist on 03/05/22 and referred to this OT with bilateral hand rheumatoid arthritis and carpal tunnel symptoms in the right hand.  Checking ESR and CRP to assess inflammatory state. She is having new numbness and pain in her hands. The numbness is significantly improved s/p injection yesterday by Dr. Louanne Skye so we will wait to adjust medications for 1 week to see if this shot will help mre.  Patient was seen by a new rheumatologist.  Is going to continue the methotrexate but starting with a new arthritis  medication.  Patient returned this week with increase pain and stiffness in left fourth and fifth digit with tenderness over A1 pulley as well as the right thumb trigger thumb with tenderness and pain over A1 pulley order for OT to eval and treat as well as.    Patient Stated Goals I want you to help me to make sure that my hands does not get worse again I want to strengthen the mobility so I can do the things that I like to do.    Currently in Pain? Yes    Pain Score 5    7 R thumb   Pain Location Hand    Pain Orientation Right;Left    Pain Descriptors / Indicators Tender;Sore    Pain Type Acute pain    Pain Onset More than a month ago    Pain Frequency  Intermittent                [facility] OT Assessment - 06/20/22 0001       Right Hand AROM   R Thumb MCP 0-60 60 Degrees    R Thumb IP 0-80 35 Degrees                   SKin check done prior - no issues - tolerating well - did had to apply some coban over R thumb patch to keep tight contact Keep patch on for hour afterwards  OT Treatments/Exercises (OP) - 06/20/22 0001       Iontophoresis   Type of Iontophoresis Dexamethasone    Location R thumb A1pulley, L 4th and 5th A1pulley    Dose 2 med patch, thumb A1pulley , L 4th and 5th A1pulley - 2.0 current    Time 19      RUE Contrast Bath   Time 8 minutes    Comments prior to soft tissue nad motion      LUE Contrast Bath   Time 8 minutes    Comments decrease soft tissue and ROM              Patient to do contrast to 3 times a day on bilateral hands with pain-free active range of motion of tendon glides focusing on MC as well as intrinsic  But PROM prior to AAROM- pain free composite - no triggering  Had less pain and increase motion today    As well as bilateral thumb palmar radial abduction 10 reps pain-free As well as wrist flexion extension and radial ulnar deviation pain-free. Patient can wear thumb spica splint on right to decrease triggering of thumb IP. Done this date after manual for thumb PA and RA -and graston over volar wrist and palm - digits including thumbs prior to PROM for R thumb IP and MC - composite - without triggering  Pt can do gentle PROM if no triggering and pain free  Then AROM to 2nd and 3rd blocking MC and focus on extention inbetween After contrast  As well as continue joint protection principles and modifications.         OT Education - 06/20/22 1418     Education Details Progress and changes to home program    Person(s) Educated Patient    Methods Explanation;Demonstration;Tactile cues;Verbal cues;Handout    Comprehension Verbal cues required;Returned  demonstration;Verbalized understanding              OT Short Term Goals - 06/13/22 2042       OT SHORT TERM GOAL #1   Title  Pt to be indep with splinting schedule to decrease pain in bilat hands.    Baseline Patient to wear thumb spica splint on right hand to decrease pain in thumb and wrist and can also do Band-Aid over IP to decrease pain and triggering.    Time 4    Period Weeks    Status New    Target Date 07/12/22               OT Long Term Goals - 06/13/22 2043       OT LONG TERM GOAL #1   Title Patient to be independent in home program to increase active range of motion to within normal limits with decreased pain and edema in bilateral hands    Baseline Patient is active range of motion decreased in right thumb IP flexion and wrist.  With increased pain 9/10 at A1 pulley of thumb.  7/10 pain and tenderness over A1 pulley of left fourth and fifth.  With decreased flexion of fourth and fifth digits    Time 4    Period Weeks    Status New    Target Date 07/11/22      OT LONG TERM GOAL #2   Title Patient reports 3 joint protection principles or adaptive equipment to increase ease or use of hands and ADLs    Baseline Patient continues to need reminders meant and education for joint protection that was educated in the past-patient had a flareup the last 3 weeks after working out in [employer] on [employer].    Time 6    Period Weeks    Status New    Target Date 07/25/22      OT LONG TERM GOAL #3   Title Patient's grip and prehension strength increase without pain to about the same then she was February of last year    Baseline Did not assess patient's grip and prehension patient had a flareup the last 3 weeks with trigger thumb on the right decrease flexibility increased triggering and 7/10 pain over A1 pulley.  Left increase edema and pain in fourth and fifth with A1 pulleys 7/10 tenderness    Time 8    Period Weeks    Target Date 08/08/22                    Plan - 06/20/22 1406     Clinical Impression Statement Patient was seen by this OT previously this year for a diagnosis of bilateral rheumatoid arthritis as well as carpal tunnel symptoms with the right worse than the left.  Patient showed great progress but had a couple of flareups throughout that time.  Patient went for second opinion with a rheumatologist in [place].  Patient referred to  OT this  time with right thumb trigger finger increased swelling and pain and decreased IP flexion starting about 3 weeks ago.  As well as left fourth and fifth digit increase edema and pain with decreased motion.  Patient started working out at [employer] on machines as well as elliptical.  Patient tender 7 out of 10 over left fourth and fifth A1 pulley.  With decreased flexion.  Right thumb patient with increased pain decreased IP flexion with 9/10 pain over A1 pulley at eval - this date pt decrease pain 5/10 at L and R 7/10 - increase PROM and AAROM.  Patient showing less  clicking in session at thumb IP on the R .  Tolerating ionto well - 3rd session  today.  Did not assess strength.  Patient limited in functional use of bilateral hands in ADLs and IADLs.  Patient can benefit from skilled OT services to decrease pain, edema increase motion and strength to return to prior level of function.    OT Occupational Profile and History Problem Focused Assessment - Including review of records relating to presenting problem    Occupational performance deficits (Please refer to evaluation for details): ADL's;IADL's;Rest and Sleep;Play;Leisure;Social Participation    Body Structure / Function / Physical Skills ADL;Decreased knowledge of use of DME;Strength;Pain;Edema;UE functional use;IADL;ROM;Flexibility    Rehab Potential Fair    Clinical Decision Making Several treatment options, min-mod task modification necessary    Comorbidities Affecting Occupational Performance: May have comorbidities impacting  occupational performance    Modification or Assistance to Complete Evaluation  No modification of tasks or assist necessary to complete eval    OT Frequency 2x / week    OT Duration 8 weeks    OT Treatment/Interventions Self-care/ADL training;Moist Heat;DME and/or AE instruction;Splinting;Contrast Bath;Therapeutic exercise;Paraffin;Patient/family education;Manual Therapy;Iontophoresis    Consulted and Agree with Plan of Care Patient             Patient will benefit from skilled therapeutic intervention in order to improve the following deficits and impairments:   Body Structure / Function / Physical Skills: ADL, Decreased knowledge of use of DME, Strength, Pain, Edema, UE functional use, IADL, ROM, Flexibility       Visit Diagnosis: Pain in left hand  Stiffness of left hand, not elsewhere classified  Stiffness of right hand, not elsewhere classified  Pain in thumb joint with movement of right hand  Pain in right hand  Muscle weakness (generalized)    Problem List Patient Active Problem List   Diagnosis Date Noted   Rheumatoid arthritis involving multiple sites ([place]) 03/08/2022   Insomnia 03/08/2022   Dysphagia 10/31/2021   Lymphadenopathy of head and neck 06/16/2021   Long-term use of immunosuppressant medication 05/31/2021   Eye drainage 03/17/2021   Fatigue 01/27/2021   Chronic pain of both shoulders 06/01/2020   Swelling of both hands 06/01/2020   Cervical disc disorder with radiculopathy of cervical region 05/24/2020   Post inflammatory hypopigmentation 12/30/2019   Claudication in peripheral vascular disease ([place]) 12/22/2019   Arthritis of carpometacarpal (CMC) joint of left thumb 10/28/2019   Hand arthritis 10/01/2019   Statin myopathy 08/30/2019   SOB (shortness of breath) on exertion 06/19/2019   Hyperlipidemia 04/08/2019   S/P CABG x 5 01/05/2019   Left leg pain 09/30/2018   Degenerative joint disease of low back 01/09/2017   Routine general medical  examination at a health care facility 10/02/2016   GERD (gastroesophageal reflux disease) 07/28/2016   H/O malignant neoplasm of breast 06/19/2016    Rosalyn Gess, OTR/L,CLT 06/20/2022, 2:46 PM   [facility] [address], [place], [address] Phone: [phone]   Fax:  [phone]  Name: Jessica [name] MRN: [id number] Date of Birth: 18-Jul-1951

## 2022-06-26 ENCOUNTER — Ambulatory Visit: Payer: Medicare PPO | Attending: Rheumatology | Admitting: Occupational Therapy

## 2022-06-26 DIAGNOSIS — M25642 Stiffness of left hand, not elsewhere classified: Secondary | ICD-10-CM | POA: Insufficient documentation

## 2022-06-26 DIAGNOSIS — M6281 Muscle weakness (generalized): Secondary | ICD-10-CM | POA: Insufficient documentation

## 2022-06-26 DIAGNOSIS — M79642 Pain in left hand: Secondary | ICD-10-CM | POA: Diagnosis not present

## 2022-06-26 DIAGNOSIS — M79641 Pain in right hand: Secondary | ICD-10-CM | POA: Diagnosis not present

## 2022-06-26 DIAGNOSIS — M25541 Pain in joints of right hand: Secondary | ICD-10-CM | POA: Insufficient documentation

## 2022-06-26 DIAGNOSIS — M25641 Stiffness of right hand, not elsewhere classified: Secondary | ICD-10-CM | POA: Insufficient documentation

## 2022-06-26 NOTE — Therapy (Signed)
[place] [facility] [address], [place], [address] Phone: [phone]   Fax:  [phone]  Occupational Therapy Treatment  Patient Details  Name: Jessica [name] MRN: [id number] Date of Birth: [birth year] Referring Provider (OT): Dr Dossie Der   Encounter Date: 06/26/2022   OT End of Session - 06/26/22 1316     Visit Number 4    Number of Visits 12    Date for OT Re-Evaluation 08/08/22    OT Start Time 0950    OT Stop Time 1036    OT Time Calculation (min) 46 min    Activity Tolerance Patient tolerated treatment well    Behavior During Therapy [facility] for tasks assessed/performed             Past Medical History:  Diagnosis Date   Allergy    Breast cancer ([place]) 2000   Cancer ([place])    breast   Colon polyp    Coronary artery disease    GERD (gastroesophageal reflux disease)    History of blood transfusion    Hyperlipidemia    Migraine    history of migrarines, none as an adult   Personal history of radiation therapy 2000    Past Surgical History:  Procedure Laterality Date   ABDOMINAL HYSTERECTOMY     BREAST LUMPECTOMY Right 2000   BREAST SURGERY Right    calcifaction removed   BUNIONECTOMY Bilateral    COLONOSCOPY W/ POLYPECTOMY     CORONARY ARTERY BYPASS GRAFT N/A 01/05/2019   Procedure: CORONARY ARTERY BYPASS GRAFTING (CABG), using right leg saphenous endoscopic and open vein harvest, exploration left leg;  Surgeon: Gaye Pollack, MD;  Location: [place];  Service: Open Heart Surgery;  Laterality: N/A;   EYE SURGERY Bilateral    cataract   hemorrhoid     LEFT HEART CATH AND CORONARY ANGIOGRAPHY N/A 01/01/2019   Procedure: LEFT HEART CATH AND CORONARY ANGIOGRAPHY;  Surgeon: Jettie Booze, MD;  Location: [facility];  Service: Cardiovascular;  Laterality: N/A;   STERNAL WIRES REMOVAL N/A 05/11/2019   Procedure: STERNAL WIRES REMOVAL;  Surgeon: Gaye Pollack, MD;  [facility];   Service: Thoracic;  Laterality: N/A;   TEE WITHOUT CARDIOVERSION N/A 01/05/2019   Procedure: TRANSESOPHAGEAL ECHOCARDIOGRAM (TEE);  Surgeon: Gaye Pollack, MD;  Location: [place];  Service: Open Heart Surgery;  Laterality: N/A;    There were no vitals filed for this visit.   Subjective Assessment - 06/26/22 1017     Subjective  Pain is better with me using them - L hand better - less soreness - stiffness more- and thumb still some pain and stiff- less triggering    Pertinent History Patient with increased pain since February 23 and bilateral hands with increased inflammation as well as swelling pain and numbness.  Patient report 10/10 pain in bilateral hands.  Did see since then Dr. Posey Pronto the rheumatologist as well as orthopedist on 03/05/22 and referred to this OT with bilateral hand rheumatoid arthritis and carpal tunnel symptoms in the right hand.  Checking ESR and CRP to assess inflammatory state. She is having new numbness and pain in her hands. The numbness is significantly improved s/p injection yesterday by Dr. Louanne Skye so we will wait to adjust medications for 1 week to see if this shot will help mre.  Patient was seen by a new rheumatologist.  Is going to continue the methotrexate but starting with a new arthritis medication.  Patient returned this week with increase pain and stiffness in left fourth and fifth digit with tenderness over A1 pulley as well as the right thumb trigger thumb with tenderness and pain over A1 pulley order for OT to eval and treat as well as.    Patient Stated Goals I want you to help me to make sure that my hands does not get worse again I want to strengthen the mobility so I can do the things that I like to do.    Currently in Pain? Yes    Pain Score 8    A1pulley R thumb ; 1/10 L 4th and 5th               [facility] OT Assessment - 06/26/22 0001       Right Hand AROM   R Thumb MCP 0-60 60 Degrees    R Thumb IP 0-80 --   35 PROM- AROM 20, blocked 30      Left Hand AROM   L Long  MCP 0-90 90 Degrees    L Long PIP 0-100 100 Degrees    L Ring  MCP 0-90 90 Degrees    L Ring PIP 0-100 95 Degrees    L Little  MCP 0-90 100 Degrees    L Little PIP 0-100 90 Degrees                   Skin check done- not issues - tolerate well -keep patches on for hour    OT Treatments/Exercises (OP) - 06/26/22 0001       Iontophoresis   Type of Iontophoresis Dexamethasone    Location R thumb A1pulley, L 4th and 5th A1pulley    Dose R thumb small patch, L med 4th and 5th 2.0 current    Time 19      RUE Contrast Bath   Time 8 minutes    Comments prior to soft tissue and ROM      LUE Contrast Bath   Time 8 minutes    Comments prior to soft issue and ROM               Patient to do contrast to 3 times a day on bilateral hands with pain-free active range of motion of tendon glides focusing on MC as well as intrinsic  But PROM prior to AAROM- pain free composite - no triggering  Had less pain and increase motion today    As well as bilateral thumb palmar radial abduction 10 reps pain-free As well as wrist flexion extension and radial ulnar deviation pain-free. Patient can wear thumb spica splint on right to decrease triggering of thumb IP. Done this date after manual for thumb PA and RA -and graston over volar wrist and palm - digits including thumbs prior to PROM for R thumb IP and MC - then  composite - without triggering  Pt can do PROM if no triggering and pain free  Then AROM opposition to all digits blocking MC and focus on extention inbetween with no triggering this date  After contrast  As well as continue joint protection principles and modifications.         OT Education - 06/26/22 1316     Education Details Progress and changes to home program    Person(s) Educated Patient    Methods Explanation;Demonstration;Tactile cues;Verbal cues;Handout    Comprehension Verbal cues required;Returned demonstration;Verbalized  understanding  OT Short Term Goals - 06/13/22 2042       OT SHORT TERM GOAL #1   Title Pt to be indep with splinting schedule to decrease pain in bilat hands.    Baseline Patient to wear thumb spica splint on right hand to decrease pain in thumb and wrist and can also do Band-Aid over IP to decrease pain and triggering.    Time 4    Period Weeks    Status New    Target Date 07/12/22               OT Long Term Goals - 06/13/22 2043       OT LONG TERM GOAL #1   Title Patient to be independent in home program to increase active range of motion to within normal limits with decreased pain and edema in bilateral hands    Baseline Patient is active range of motion decreased in right thumb IP flexion and wrist.  With increased pain 9/10 at A1 pulley of thumb.  7/10 pain and tenderness over A1 pulley of left fourth and fifth.  With decreased flexion of fourth and fifth digits    Time 4    Period Weeks    Status New    Target Date 07/11/22      OT LONG TERM GOAL #2   Title Patient reports 3 joint protection principles or adaptive equipment to increase ease or use of hands and ADLs    Baseline Patient continues to need reminders meant and education for joint protection that was educated in the past-patient had a flareup the last 3 weeks after working out in [employer] on [employer].    Time 6    Period Weeks    Status New    Target Date 07/25/22      OT LONG TERM GOAL #3   Title Patient's grip and prehension strength increase without pain to about the same then she was February of last year    Baseline Did not assess patient's grip and prehension patient had a flareup the last 3 weeks with trigger thumb on the right decrease flexibility increased triggering and 7/10 pain over A1 pulley.  Left increase edema and pain in fourth and fifth with A1 pulleys 7/10 tenderness    Time 8    Period Weeks    Target Date 08/08/22                   Plan - 06/26/22  1317     Clinical Impression Statement Patient was seen by this OT previously this year for a diagnosis of bilateral rheumatoid arthritis as well as carpal tunnel symptoms with the right worse than the left.  Patient showed great progress but had a couple of flareups throughout that time.  Patient went for second opinion with a rheumatologist in [place].  Patient referred to  OT this  time with right thumb trigger finger increased swelling and pain and decreased IP flexion starting about 3 weeks ago.  As well as left fourth and fifth digit increase edema and pain with decreased motion.  Patient started working out at [employer] on machines as well as elliptical.   At eval patient tender 7 out of 10 over left fourth and fifth A1 pulley.  With decreased flexion.  Right thumb patient with increased pain decreased IP flexion with 9/10 pain over A1 pulley at eval - this date pt decrease pain 1/10 at L and R 8/10 - increase PROM  and AAROM.  Patient showing less  clicking at thumb IP on the R but still stiffness .  Tolerating ionto well - 4th session today.  Did not assess strength.  Patient limited in functional use of bilateral hands in ADLs and IADLs.  Patient can benefit from skilled OT services to decrease pain, edema increase motion and strength to return to prior level of function.    OT Occupational Profile and History Problem Focused Assessment - Including review of records relating to presenting problem    Occupational performance deficits (Please refer to evaluation for details): ADL's;IADL's;Rest and Sleep;Play;Leisure;Social Participation    Body Structure / Function / Physical Skills ADL;Decreased knowledge of use of DME;Strength;Pain;Edema;UE functional use;IADL;ROM;Flexibility    Rehab Potential Fair    Clinical Decision Making Several treatment options, min-mod task modification necessary    Comorbidities Affecting Occupational Performance: May have comorbidities impacting occupational performance     Modification or Assistance to Complete Evaluation  No modification of tasks or assist necessary to complete eval    OT Frequency 2x / week    OT Duration 8 weeks    OT Treatment/Interventions Self-care/ADL training;Moist Heat;DME and/or AE instruction;Splinting;Contrast Bath;Therapeutic exercise;Paraffin;Patient/family education;Manual Therapy;Iontophoresis    Consulted and Agree with Plan of Care Patient             Patient will benefit from skilled therapeutic intervention in order to improve the following deficits and impairments:   Body Structure / Function / Physical Skills: ADL, Decreased knowledge of use of DME, Strength, Pain, Edema, UE functional use, IADL, ROM, Flexibility       Visit Diagnosis: Pain in left hand  Stiffness of left hand, not elsewhere classified  Stiffness of right hand, not elsewhere classified  Pain in thumb joint with movement of right hand  Pain in right hand  Muscle weakness (generalized)    Problem List Patient Active Problem List   Diagnosis Date Noted   Rheumatoid arthritis involving multiple sites ([place]) 03/08/2022   Insomnia 03/08/2022   Dysphagia 10/31/2021   Lymphadenopathy of head and neck 06/16/2021   Long-term use of immunosuppressant medication 05/31/2021   Eye drainage 03/17/2021   Fatigue 01/27/2021   Chronic pain of both shoulders 06/01/2020   Swelling of both hands 06/01/2020   Cervical disc disorder with radiculopathy of cervical region 05/24/2020   Post inflammatory hypopigmentation 12/30/2019   Claudication in peripheral vascular disease ([place]) 12/22/2019   Arthritis of carpometacarpal (CMC) joint of left thumb 10/28/2019   Hand arthritis 10/01/2019   Statin myopathy 08/30/2019   SOB (shortness of breath) on exertion 06/19/2019   Hyperlipidemia 04/08/2019   S/P CABG x 5 01/05/2019   Left leg pain 09/30/2018   Degenerative joint disease of low back 01/09/2017   Routine general medical examination at a health  care facility 10/02/2016   GERD (gastroesophageal reflux disease) 07/28/2016   H/O malignant neoplasm of breast 06/19/2016    Rosalyn Gess, OTR/l,CLT 06/26/2022, 1:20 PM  [place] [facility] [address] [place], [address] Phone: [phone]   Fax:  [phone]  Name: Jessica [name] MRN: [id number] Date of Birth: April 15, 1951

## 2022-06-28 ENCOUNTER — Ambulatory Visit: Payer: Medicare PPO | Admitting: Occupational Therapy

## 2022-06-28 DIAGNOSIS — M25541 Pain in joints of right hand: Secondary | ICD-10-CM | POA: Diagnosis not present

## 2022-06-28 DIAGNOSIS — M25642 Stiffness of left hand, not elsewhere classified: Secondary | ICD-10-CM

## 2022-06-28 DIAGNOSIS — M79642 Pain in left hand: Secondary | ICD-10-CM | POA: Diagnosis not present

## 2022-06-28 DIAGNOSIS — M6281 Muscle weakness (generalized): Secondary | ICD-10-CM | POA: Diagnosis not present

## 2022-06-28 DIAGNOSIS — M79641 Pain in right hand: Secondary | ICD-10-CM

## 2022-06-28 DIAGNOSIS — M25641 Stiffness of right hand, not elsewhere classified: Secondary | ICD-10-CM

## 2022-06-28 NOTE — Therapy (Signed)
[place] [facility] [address] [place], [address] Phone: [phone]   Fax:  [phone]  Occupational Therapy Treatment  Patient Details  Name: Jessica [name] MRN: [id number] Date of Birth: 07-Dec-1950 Referring Provider (OT): Dr Dossie Der   Encounter Date: 06/28/2022   OT End of Session - 06/28/22 0949     Visit Number 5    Number of Visits 12    Date for OT Re-Evaluation 08/08/22    OT Start Time 0948    OT Stop Time 1040    OT Time Calculation (min) 52 min    Activity Tolerance Patient tolerated treatment well    Behavior During Therapy [facility] for tasks assessed/performed             Past Medical History:  Diagnosis Date   Allergy    Breast cancer ([place]) 2000   Cancer ([place])    breast   Colon polyp    Coronary artery disease    GERD (gastroesophageal reflux disease)    History of blood transfusion    Hyperlipidemia    Migraine    history of migrarines, none as an adult   Personal history of radiation therapy 2000    Past Surgical History:  Procedure Laterality Date   ABDOMINAL HYSTERECTOMY     BREAST LUMPECTOMY Right 2000   BREAST SURGERY Right    calcifaction removed   BUNIONECTOMY Bilateral    COLONOSCOPY W/ POLYPECTOMY     CORONARY ARTERY BYPASS GRAFT N/A 01/05/2019   Procedure: CORONARY ARTERY BYPASS GRAFTING (CABG), using right leg saphenous endoscopic and open vein harvest, exploration left leg;  Surgeon: Gaye Pollack, MD;  Location: [place];  Service: Open Heart Surgery;  Laterality: N/A;   EYE SURGERY Bilateral    cataract   hemorrhoid     LEFT HEART CATH AND CORONARY ANGIOGRAPHY N/A 01/01/2019   Procedure: LEFT HEART CATH AND CORONARY ANGIOGRAPHY;  Surgeon: Jettie Booze, MD;  Location: [place] [facility];  Service: Cardiovascular;  Laterality: N/A;   STERNAL WIRES REMOVAL N/A 05/11/2019   Procedure: STERNAL WIRES REMOVAL;  Surgeon: Gaye Pollack, MD;  [facility];   Service: Thoracic;  Laterality: N/A;   TEE WITHOUT CARDIOVERSION N/A 01/05/2019   Procedure: TRANSESOPHAGEAL ECHOCARDIOGRAM (TEE);  Surgeon: Gaye Pollack, MD;  Location: [place];  Service: Open Heart Surgery;  Laterality: N/A;    There were no vitals filed for this visit.   Subjective Assessment - 06/28/22 0947     Subjective  Doing okay -took my methotrexate last night again.  So is now 4 weeks since started taking it.  Left ring and pinky just little sore but not stiff.  Thumb still stiff at the tip    Pertinent History Patient with increased pain since February 23 and bilateral hands with increased inflammation as well as swelling pain and numbness.  Patient report 10/10 pain in bilateral hands.  Did see since then Dr. Posey Pronto the rheumatologist as well as orthopedist on 03/05/22 and referred to this OT with bilateral hand rheumatoid arthritis and carpal tunnel symptoms in the right hand.  Checking ESR and CRP to assess inflammatory state. She is having new numbness and pain in her hands. The numbness is significantly improved s/p injection yesterday by Dr. Louanne Skye so we will wait to adjust medications for 1 week to see if this shot will help mre.  Patient was seen by a new rheumatologist.  Is going to continue  the methotrexate but starting with a new arthritis medication.  Patient returned this week with increase pain and stiffness in left fourth and fifth digit with tenderness over A1 pulley as well as the right thumb trigger thumb with tenderness and pain over A1 pulley order for OT to eval and treat as well as.    Patient Stated Goals I want you to help me to make sure that my hands does not get worse again I want to strengthen the mobility so I can do the things that I like to do.    Pain Score 7    R thumb A1pulley, L 4th and 5th 1/10   Pain Orientation Right;Left    Pain Descriptors / Indicators Tender;Tightness    Pain Type Acute pain    Pain Onset More than a month ago                 [facility] OT Assessment - 06/28/22 0001       Right Hand AROM   R Thumb MCP 0-60 60 Degrees    R Thumb IP 0-80 50 Degrees   PROM , AROM BLOCKED 30 , AROM 20                    Skin check done prior patient tolerating ionto well.  Patient can keep the medication patch on for hour afterwards.   OT Treatments/Exercises (OP) - 06/28/22 0001       Iontophoresis   Type of Iontophoresis Dexamethasone    Location R thumb A1pulley, L 4th and 5th A1pulley    Dose R thumb med patch, L med 4th and 5th 2.0 current    Time 19      RUE Contrast Bath   Time 8 minutes    Comments prior to soft tissue      LUE Contrast Bath   Time 8 minutes    Comments prior to soft tissue             Patient to do contrast to 3 times a day on bilateral hands with pain-free active range of motion of tendon glides focusing on MC as well as intrinsic  But PROM prior to [facility]- pain free composite - no triggering  Had less pain and increase motion today again   As well as bilateral thumb palmar radial abduction 10 reps pain-free As well as wrist flexion extension and radial ulnar deviation pain-free. Patient can wear thumb spica splint on right to decrease triggering of thumb IP. Done this date after manual for thumb PA and RA -and graston over volar wrist and palm - digits including thumbs prior to PROM for R thumb IP and MC - then  composite - without triggering  Pt can do PROM if no triggering and pain free  Then AROM opposition to all digits blocking MC and focus on extention inbetween with no triggering this date   Pt needed REINFORCEMENT AGAIN today for HEP FOR PROM and AAROM - blocking for R thumb  After contrast  As well as continue joint protection principles and modifications.       OT Education - 06/28/22 0949     Education Details Progress and changes to home program    Person(s) Educated Patient    Methods Explanation;Demonstration;Tactile cues;Verbal cues;Handout     Comprehension Verbal cues required;Returned demonstration;Verbalized understanding              OT Short Term Goals - 06/13/22 2042  OT SHORT TERM GOAL #1   Title Pt to be indep with splinting schedule to decrease pain in bilat hands.    Baseline Patient to wear thumb spica splint on right hand to decrease pain in thumb and wrist and can also do Band-Aid over IP to decrease pain and triggering.    Time 4    Period Weeks    Status New    Target Date 07/12/22               OT Long Term Goals - 06/13/22 2043       OT LONG TERM GOAL #1   Title Patient to be independent in home program to increase active range of motion to within normal limits with decreased pain and edema in bilateral hands    Baseline Patient is active range of motion decreased in right thumb IP flexion and wrist.  With increased pain 9/10 at A1 pulley of thumb.  7/10 pain and tenderness over A1 pulley of left fourth and fifth.  With decreased flexion of fourth and fifth digits    Time 4    Period Weeks    Status New    Target Date 07/11/22      OT LONG TERM GOAL #2   Title Patient reports 3 joint protection principles or adaptive equipment to increase ease or use of hands and ADLs    Baseline Patient continues to need reminders meant and education for joint protection that was educated in the past-patient had a flareup the last 3 weeks after working out in [employer] on [employer].    Time 6    Period Weeks    Status New    Target Date 07/25/22      OT LONG TERM GOAL #3   Title Patient's grip and prehension strength increase without pain to about the same then she was February of last year    Baseline Did not assess patient's grip and prehension patient had a flareup the last 3 weeks with trigger thumb on the right decrease flexibility increased triggering and 7/10 pain over A1 pulley.  Left increase edema and pain in fourth and fifth with A1 pulleys 7/10 tenderness    Time 8    Period Weeks     Target Date 08/08/22                   Plan - 06/28/22 0949     Clinical Impression Statement Patient was seen by this OT previously this year for a diagnosis of bilateral rheumatoid arthritis as well as carpal tunnel symptoms with the right worse than the left.  Patient showed great progress but had a couple of flareups throughout that time.  Patient went for second opinion with a rheumatologist in [place].  Patient referred to  OT this  time with right thumb trigger finger increased swelling and pain and decreased IP flexion starting about 3 weeks ago.  As well as left fourth and fifth digit increase edema and pain with decreased motion.  Patient started working out at [employer] on machines as well as elliptical.   At eval patient tender 7 out of 10 over left fourth and fifth A1 pulley.  With decreased flexion.  Right thumb patient with increased pain decreased IP flexion with 9/10 pain over A1 pulley at eval - this date pt decrease pain 1/10 at L and R 7/10 - increase PROM and AAROM.  Patient showing less  clicking at thumb IP on the  R but still stiffness .  Tolerating ionto well - 5th session today.  Did not assess strength.  Patient limited in functional use of bilateral hands in ADLs and IADLs.  Patient can benefit from skilled OT services to decrease pain, edema increase motion and strength to return to prior level of function.    OT Occupational Profile and History Problem Focused Assessment - Including review of records relating to presenting problem    Occupational performance deficits (Please refer to evaluation for details): ADL's;IADL's;Rest and Sleep;Play;Leisure;Social Participation    Body Structure / Function / Physical Skills ADL;Decreased knowledge of use of DME;Strength;Pain;Edema;UE functional use;IADL;ROM;Flexibility    Rehab Potential Fair    Clinical Decision Making Several treatment options, min-mod task modification necessary    Comorbidities Affecting Occupational  Performance: May have comorbidities impacting occupational performance    Modification or Assistance to Complete Evaluation  No modification of tasks or assist necessary to complete eval    OT Frequency 2x / week    OT Duration 8 weeks    OT Treatment/Interventions Self-care/ADL training;Moist Heat;DME and/or AE instruction;Splinting;Contrast Bath;Therapeutic exercise;Paraffin;Patient/family education;Manual Therapy;Iontophoresis    Consulted and Agree with Plan of Care Patient             Patient will benefit from skilled therapeutic intervention in order to improve the following deficits and impairments:   Body Structure / Function / Physical Skills: ADL, Decreased knowledge of use of DME, Strength, Pain, Edema, UE functional use, IADL, ROM, Flexibility       Visit Diagnosis: Pain in left hand  Stiffness of left hand, not elsewhere classified  Pain in thumb joint with movement of right hand  Pain in right hand  Stiffness of right hand, not elsewhere classified    Problem List Patient Active Problem List   Diagnosis Date Noted   Rheumatoid arthritis involving multiple sites ([place]) 03/08/2022   Insomnia 03/08/2022   Dysphagia 10/31/2021   Lymphadenopathy of head and neck 06/16/2021   Long-term use of immunosuppressant medication 05/31/2021   Eye drainage 03/17/2021   Fatigue 01/27/2021   Chronic pain of both shoulders 06/01/2020   Swelling of both hands 06/01/2020   Cervical disc disorder with radiculopathy of cervical region 05/24/2020   Post inflammatory hypopigmentation 12/30/2019   Claudication in peripheral vascular disease ([place]) 12/22/2019   Arthritis of carpometacarpal (CMC) joint of left thumb 10/28/2019   Hand arthritis 10/01/2019   Statin myopathy 08/30/2019   SOB (shortness of breath) on exertion 06/19/2019   Hyperlipidemia 04/08/2019   S/P CABG x 5 01/05/2019   Left leg pain 09/30/2018   Degenerative joint disease of low back 01/09/2017   Routine  general medical examination at a health care facility 10/02/2016   GERD (gastroesophageal reflux disease) 07/28/2016   H/O malignant neoplasm of breast 06/19/2016    Rosalyn Gess, OTR/l,CLT 06/28/2022, 1:24 PM  [place] [facility] [address], [place], [address] Phone: [phone]   Fax:  [phone]  Name: Jessica [name] MRN: [id number] Date of Birth: 1951/08/22

## 2022-07-01 ENCOUNTER — Encounter: Payer: Self-pay | Admitting: Internal Medicine

## 2022-07-03 ENCOUNTER — Ambulatory Visit (INDEPENDENT_AMBULATORY_CARE_PROVIDER_SITE_OTHER): Payer: Medicare PPO

## 2022-07-03 DIAGNOSIS — Z79899 Other long term (current) drug therapy: Secondary | ICD-10-CM | POA: Diagnosis not present

## 2022-07-03 DIAGNOSIS — M069 Rheumatoid arthritis, unspecified: Secondary | ICD-10-CM | POA: Diagnosis not present

## 2022-07-03 DIAGNOSIS — Z23 Encounter for immunization: Secondary | ICD-10-CM | POA: Diagnosis not present

## 2022-07-03 DIAGNOSIS — M79643 Pain in unspecified hand: Secondary | ICD-10-CM | POA: Diagnosis not present

## 2022-07-03 DIAGNOSIS — M1990 Unspecified osteoarthritis, unspecified site: Secondary | ICD-10-CM | POA: Diagnosis not present

## 2022-07-03 NOTE — Progress Notes (Signed)
Patient received flu vaccine w/o any complications.

## 2022-07-05 DIAGNOSIS — Z961 Presence of intraocular lens: Secondary | ICD-10-CM | POA: Diagnosis not present

## 2022-07-05 DIAGNOSIS — H04123 Dry eye syndrome of bilateral lacrimal glands: Secondary | ICD-10-CM | POA: Diagnosis not present

## 2022-07-05 DIAGNOSIS — H524 Presbyopia: Secondary | ICD-10-CM | POA: Diagnosis not present

## 2022-07-06 ENCOUNTER — Ambulatory Visit: Payer: Medicare PPO | Admitting: Internal Medicine

## 2022-07-12 ENCOUNTER — Encounter: Payer: Self-pay | Admitting: Podiatry

## 2022-07-12 ENCOUNTER — Ambulatory Visit: Payer: Medicare PPO | Admitting: Podiatry

## 2022-07-12 DIAGNOSIS — M79675 Pain in left toe(s): Secondary | ICD-10-CM

## 2022-07-12 DIAGNOSIS — I739 Peripheral vascular disease, unspecified: Secondary | ICD-10-CM

## 2022-07-12 DIAGNOSIS — M79674 Pain in right toe(s): Secondary | ICD-10-CM

## 2022-07-12 DIAGNOSIS — B351 Tinea unguium: Secondary | ICD-10-CM | POA: Diagnosis not present

## 2022-07-12 NOTE — Progress Notes (Signed)
This patient returns to my office for at risk foot care.  This patient requires this care by a professional since this patient will be at risk due to having claudication with  PVD.  This patient is unable to cut nails herself since the patient cannot reach his nails. She attempted to trim her nail herself and she left a spicule on the inside right big toe.  Theis nails are painful walking and wearing shoes.  This patient presents for at risk foot care today.  General Appearance  Alert, conversant and in no acute stress.  Vascular  Dorsalis pedis  pulses are palpable  bilaterally. Posterior tibial pulses are absent  B/L Capillary return is within normal limits  bilaterally. Temperature is within normal limits  bilaterally.  Neurologic  Senn-Weinstein monofilament wire test within normal limits  bilaterally. Muscle power within normal limits bilaterally.  Nails Thick disfigured discolored nails with subungual debris  from hallux to fifth toes bilaterally. No evidence of bacterial infection or drainage bilaterally.  Orthopedic  No limitations of motion  feet .  No crepitus or effusions noted.  HAV  B/L.  Hammer toes  B/L.  Skin  normotropic skin with no porokeratosis noted bilaterally.  No signs of infections or ulcers noted.     Onychomycosis  Pain in right toes  Pain in left toes  Consent was obtained for treatment procedures.   Mechanical debridement of nails 1-5  bilaterally performed with a nail nipper.  Filed with dremel without incident.    Return office visit    4 months                  Told patient to return for periodic foot care and evaluation due to potential at risk complications.   Gardiner Barefoot DPM

## 2022-07-18 ENCOUNTER — Ambulatory Visit: Payer: Medicare PPO | Admitting: Family Medicine

## 2022-07-18 ENCOUNTER — Ambulatory Visit: Payer: Self-pay

## 2022-07-18 VITALS — BP 170/96 | HR 79 | Ht 66.0 in | Wt 169.0 lb

## 2022-07-18 DIAGNOSIS — M65311 Trigger thumb, right thumb: Secondary | ICD-10-CM | POA: Diagnosis not present

## 2022-07-18 DIAGNOSIS — M79641 Pain in right hand: Secondary | ICD-10-CM

## 2022-07-18 DIAGNOSIS — M79642 Pain in left hand: Secondary | ICD-10-CM | POA: Diagnosis not present

## 2022-07-18 NOTE — Patient Instructions (Addendum)
Thank you for coming in today.   You received an injection today. Seek immediate medical attention if the joint becomes red, extremely painful, or is oozing fluid.   Use the double Band-aid Splint to help stabilize the thumb  Check back as needed

## 2022-07-18 NOTE — Progress Notes (Signed)
   I, Peterson Lombard, LAT, ATC acting as a scribe for Jessica Leader, MD.  Jessica [name] is a 71 y.o. female who presents to [place] at [facility] today for bilat hand pain and RA. Pt was last seen by Dr. Georgina Snell on 11/29/21 and was prescribed a 12 course of prednisone and was referred back to the rheumatologist at the [facility]. Pt has had several visits w/ Rheumatology and is taking Plaquenil and Methotrexate. Pt has cont'd hand OT, competing 5 visits. Today, pt reports pain and triggering of the R thumb. Pt reports R thumb triggering has been ongoing for at least 1 month. Pt is R-hand dominate. Pt notes that she has switched rheumatologist and is now being seen at [facility].  Dx testing: 05/01/22 Labs  04/10/22 Labs  03/06/22 Labs  01/24/22 Labs 09/27/21 R & L hand XR  Pertinent review of systems: No fevers or chills  Relevant historical information: Rheumatoid arthritis treated with hydroxychloroquine, methotrexate, and Humira Hypertension and CAD.  Exam:  BP (!) 166/98   Pulse 79   Ht '5\' 6"'$  (1.676 m)   Wt 169 lb (76.7 kg)   SpO2 98%   BMI 27.28 kg/m  General: Well Developed, well nourished, and in no acute distress.   MSK: Right hand: Normal-appearing Triggering present with flexion of thumb IP joint.  Tender palpation first palmar MCP.    Lab and Radiology Results  Procedure: Real-time Ultrasound Guided Injection of right first tendon sheath at A1 pulley at MCP (trigger thumb injection) Device: Philips Affiniti 50G Images permanently stored and available for review in PACS Verbal informed consent obtained.  Discussed risks and benefits of procedure. Warned about infection, bleeding, hyperglycemia damage to structures among others. Patient expresses understanding and agreement Time-out conducted.   Noted no overlying erythema, induration, or other signs of local infection.   Skin prepped in a sterile fashion.   Local anesthesia:  Topical Ethyl chloride.   With sterile technique and under real time ultrasound guidance: 40 mg of Kenalog and 1 mL of lidocaine injected into tendon sheath at A1 pulley. Fluid seen entering the tendon sheath.   Completed without difficulty   Pain immediately resolved suggesting accurate placement of the medication.   Advised to call if fevers/chills, erythema, induration, drainage, or persistent bleeding.   Images permanently stored and available for review in the ultrasound unit.  Impression: Technically successful ultrasound guided injection.         Assessment and Plan: 71 y.o. female with right trigger thumb.  Plan for injection today.  Plan for double Band-Aid splint.  Check back as needed.  Of note her blood pressure was elevated today on recheck.  Recommend checking it at home starting tomorrow and if still elevated persistently schedule a primary care provider.   PDMP not reviewed this encounter. Orders Placed This Encounter  Procedures   [place] LIMITED JOINT SPACE STRUCTURES UP BILAT(NO LINKED CHARGES)    Order Specific Question:   Reason for Exam (SYMPTOM  OR DIAGNOSIS REQUIRED)    Answer:   bilateral hand pain    Order Specific Question:   Preferred imaging location?    Answer:   [place]   No orders of the defined types were placed in this encounter.    Discussed warning signs or symptoms. Please see discharge instructions. Patient expresses understanding.   The above documentation has been reviewed and is accurate and complete Jessica [name], M.D.

## 2022-07-25 ENCOUNTER — Ambulatory Visit: Payer: Medicare PPO | Admitting: Internal Medicine

## 2022-07-25 ENCOUNTER — Encounter: Payer: Self-pay | Admitting: Internal Medicine

## 2022-07-25 VITALS — BP 140/80 | HR 54 | Temp 97.6°F | Ht 66.0 in | Wt 167.0 lb

## 2022-07-25 DIAGNOSIS — R7303 Prediabetes: Secondary | ICD-10-CM | POA: Diagnosis not present

## 2022-07-25 DIAGNOSIS — Z0000 Encounter for general adult medical examination without abnormal findings: Secondary | ICD-10-CM | POA: Diagnosis not present

## 2022-07-25 DIAGNOSIS — M069 Rheumatoid arthritis, unspecified: Secondary | ICD-10-CM

## 2022-07-25 DIAGNOSIS — E782 Mixed hyperlipidemia: Secondary | ICD-10-CM | POA: Diagnosis not present

## 2022-07-25 LAB — COMPREHENSIVE METABOLIC PANEL
ALT: 30 U/L (ref 0–35)
AST: 24 U/L (ref 0–37)
Albumin: 4.5 g/dL (ref 3.5–5.2)
Alkaline Phosphatase: 51 U/L (ref 39–117)
BUN: 15 mg/dL (ref 6–23)
CO2: 28 mEq/L (ref 19–32)
Calcium: 9.9 mg/dL (ref 8.4–10.5)
Chloride: 103 mEq/L (ref 96–112)
Creatinine, Ser: 1.12 mg/dL (ref 0.40–1.20)
GFR: 49.34 mL/min — ABNORMAL LOW (ref >60.00)
Glucose, Bld: 90 mg/dL (ref 70–99)
Potassium: 4 mEq/L (ref 3.5–5.1)
Sodium: 139 mEq/L (ref 135–145)
Total Bilirubin: 0.6 mg/dL (ref 0.2–1.2)
Total Protein: 7.5 g/dL (ref 6.0–8.3)

## 2022-07-25 LAB — CBC
HCT: 38.7 % (ref 36.0–46.0)
Hemoglobin: 12.7 g/dL (ref 12.0–15.0)
MCHC: 32.7 g/dL (ref 30.0–36.0)
MCV: 89.5 fl (ref 78.0–100.0)
Platelets: 238 10*3/uL (ref 150.0–400.0)
RBC: 4.32 Mil/uL (ref 3.87–5.11)
RDW: 16 % — ABNORMAL HIGH (ref 11.5–15.5)
WBC: 5.7 10*3/uL (ref 4.0–10.5)

## 2022-07-25 LAB — LIPID PANEL
Cholesterol: 197 mg/dL (ref 0–200)
HDL: 52.7 mg/dL (ref >39.00)
LDL Cholesterol: 129 mg/dL — ABNORMAL HIGH (ref 0–99)
NonHDL: 144.59
Total CHOL/HDL Ratio: 4
Triglycerides: 77 mg/dL (ref 0.0–149.0)
VLDL: 15.4 mg/dL (ref 0.0–40.0)

## 2022-07-25 LAB — MAGNESIUM: Magnesium: 1.9 mg/dL (ref 1.5–2.5)

## 2022-07-25 LAB — HEMOGLOBIN A1C: Hgb A1c MFr Bld: 6.1 % (ref 4.6–6.5)

## 2022-07-25 NOTE — Progress Notes (Signed)
   Subjective:   Patient ID: Jessica [name], female    DOB: [birth year], 71 y.o.   MRN: [id number]  HPI The patient is here for physical.  PMH, [facility], social history reviewed and updated  Review of Systems  Constitutional: Negative.   HENT: Negative.    Eyes: Negative.   Respiratory:  Negative for cough, chest tightness and shortness of breath.   Cardiovascular:  Negative for chest pain, palpitations and leg swelling.  Gastrointestinal:  Negative for abdominal distention, abdominal pain, constipation, diarrhea, nausea and vomiting.  Musculoskeletal:  Positive for arthralgias.  Skin: Negative.   Neurological: Negative.   Psychiatric/Behavioral: Negative.      Objective:  Physical Exam Constitutional:      Appearance: She is well-developed.  HENT:     Head: Normocephalic and atraumatic.  Cardiovascular:     Rate and Rhythm: Normal rate and regular rhythm.  Pulmonary:     Effort: Pulmonary effort is normal. No respiratory distress.     Breath sounds: Normal breath sounds. No wheezing or rales.  Abdominal:     General: Bowel sounds are normal. There is no distension.     Palpations: Abdomen is soft.     Tenderness: There is no abdominal tenderness. There is no rebound.  Musculoskeletal:     Cervical back: Normal range of motion.  Skin:    General: Skin is warm and dry.  Neurological:     Mental Status: She is alert and oriented to person, place, and time.     Coordination: Coordination normal.     Vitals:   07/25/22 0836 07/25/22 0838  BP: (!) 146/82 (!) 140/80  Pulse: (!) 54   Temp: 97.6 F (36.4 C)   SpO2: 99%   Weight: 167 lb (75.8 kg)   Height: '5\' 6"'$  (1.676 m)     Assessment & Plan:

## 2022-07-25 NOTE — Assessment & Plan Note (Signed)
Flu shot up to date. Covid-19 counseled. Pneumonia complete. Shingrix complete. Tetanus up to date. Colonoscopy up to date. Mammogram up to date, pap smear aged out and dexa up to date. Counseled about sun safety and mole surveillance. Counseled about the dangers of distracted driving. Given 10 year screening recommendations.

## 2022-07-25 NOTE — Assessment & Plan Note (Signed)
Checking HgA1c today. Recent course of steroids just stopped 1 week ago. Reviewed trend of sugars and cause today.

## 2022-07-25 NOTE — Patient Instructions (Signed)
We will check the labs today. 

## 2022-07-25 NOTE — Assessment & Plan Note (Signed)
Checking magnesium she is having some muscle cramps and this has been low in the past. Adjust as needed.

## 2022-07-25 NOTE — Assessment & Plan Note (Signed)
Checking lipid panel she is off meds currently due to side effects.

## 2022-07-30 ENCOUNTER — Telehealth: Payer: Self-pay

## 2022-07-30 NOTE — Telephone Encounter (Signed)
Patient called in stating that her hand isnt any better, wondering if we can fax the last OV to [facility] fax number is [phone].

## 2022-07-31 NOTE — Telephone Encounter (Signed)
SENT LOV

## 2022-08-13 ENCOUNTER — Encounter: Payer: Self-pay | Admitting: Internal Medicine

## 2022-08-14 ENCOUNTER — Ambulatory Visit: Payer: Medicare PPO | Admitting: Internal Medicine

## 2022-08-14 ENCOUNTER — Encounter: Payer: Self-pay | Admitting: Internal Medicine

## 2022-08-14 VITALS — BP 160/92 | HR 52 | Temp 98.3°F | Ht 66.0 in | Wt 168.0 lb

## 2022-08-14 DIAGNOSIS — I10 Essential (primary) hypertension: Secondary | ICD-10-CM

## 2022-08-14 DIAGNOSIS — R7303 Prediabetes: Secondary | ICD-10-CM | POA: Diagnosis not present

## 2022-08-14 DIAGNOSIS — I2510 Atherosclerotic heart disease of native coronary artery without angina pectoris: Secondary | ICD-10-CM

## 2022-08-14 DIAGNOSIS — M65311 Trigger thumb, right thumb: Secondary | ICD-10-CM | POA: Diagnosis not present

## 2022-08-14 MED ORDER — LOSARTAN POTASSIUM 50 MG PO TABS: 50.0000 mg | ORAL_TABLET | Freq: Every day | ORAL | 3 refills | Status: DC

## 2022-08-14 NOTE — Telephone Encounter (Signed)
Spoke to pt --last OV w/ Dr. Sharlet Salina, been having headaches, and elevated B/p--highest reading 175/85, lowest 120/70. Please advise

## 2022-08-14 NOTE — Telephone Encounter (Signed)
Ok to start losartan 50 mg per day - needs ROV with pcp 2-3 weeks

## 2022-08-14 NOTE — Progress Notes (Signed)
Patient ID: Jessica [name], female   DOB: Feb 13, 1951, 71 y.o.   MRN: [id number]        Chief Complaint: follow up HTN recent worsening       HPI:  Jessica [name] is a 71 y.o. female here with c/o intermittent tx for HTN in past including taking off BB due to bradycardia, Has been watching her BP over the past month and seems mild to mod persistently elevated despite attention to low salt, diet, maintained activity and weight.  Has had inteermittent HA sometimes with awakening in the AM and daytime somnolence, but has no snoring or known disordered nocturnal breathing, and believes her chronic insomnia with frequent night time awakenings is more likely the cause.  Pt denies chest pain, increased sob or doe, wheezing, orthopnea, PND, increased LE swelling, palpitations, dizziness or syncope.   Pt denies polydipsia, polyuria, or new focal neuro s/s.          Wt Readings from Last 3 Encounters:  08/14/22 168 lb (76.2 kg)  07/25/22 167 lb (75.8 kg)  07/18/22 169 lb (76.7 kg)   BP Readings from Last 3 Encounters:  08/14/22 (!) 160/92  07/25/22 (!) 140/80  07/18/22 (!) 170/96         Past Medical History:  Diagnosis Date   Allergy    Breast cancer ([place]) 2000   Cancer ([place])    breast   Colon polyp    Coronary artery disease    GERD (gastroesophageal reflux disease)    History of blood transfusion    Hyperlipidemia    Migraine    history of migrarines, none as an adult   Personal history of radiation therapy 2000   Past Surgical History:  Procedure Laterality Date   ABDOMINAL HYSTERECTOMY     BREAST LUMPECTOMY Right 2000   BREAST SURGERY Right    calcifaction removed   BUNIONECTOMY Bilateral    COLONOSCOPY W/ POLYPECTOMY     CORONARY ARTERY BYPASS GRAFT N/A 01/05/2019   Procedure: CORONARY ARTERY BYPASS GRAFTING (CABG), using right leg saphenous endoscopic and open vein harvest, exploration left leg;  Surgeon: Gaye Pollack, MD;  Location: [place];  Service: Open Heart  Surgery;  Laterality: N/A;   EYE SURGERY Bilateral    cataract   hemorrhoid     LEFT HEART CATH AND CORONARY ANGIOGRAPHY N/A 01/01/2019   Procedure: LEFT HEART CATH AND CORONARY ANGIOGRAPHY;  Surgeon: Jettie Booze, MD;  Location: [place] CV LAB;  Service: Cardiovascular;  Laterality: N/A;   STERNAL WIRES REMOVAL N/A 05/11/2019   Procedure: STERNAL WIRES REMOVAL;  Surgeon: Gaye Pollack, MD;  Location: [facility] OR;  Service: Thoracic;  Laterality: N/A;   TEE WITHOUT CARDIOVERSION N/A 01/05/2019   Procedure: TRANSESOPHAGEAL ECHOCARDIOGRAM (TEE);  Surgeon: Gaye Pollack, MD;  Location: [place];  Service: Open Heart Surgery;  Laterality: N/A;    reports that she has never smoked. She has never used smokeless tobacco. She reports that she does not drink alcohol and does not use drugs. family history includes Asthma in her paternal aunt; Diabetes in her paternal aunt; Heart attack (age of onset: 2) in her father; Heart disease in her brother; Kidney disease in her paternal uncle; Stroke in her paternal uncle. Allergies  Allergen Reactions   Codeine Nausea And Vomiting   Repatha [Evolocumab]     MYALGIAS   Sulfa Antibiotics Swelling   Sulfacetamide Swelling   Current Outpatient Medications on File Prior to Visit  Medication Sig Dispense  Refill   Ascorbic Acid (VITAMIN C) 1000 MG tablet Take 2,000 mg by mouth daily.     Cholecalciferol (VITAMIN D-3) 125 MCG (5000 UT) TABS Take 5,000 Units by mouth daily.     etanercept (ENBREL) 50 MG/ML injection Inject 50 mg into the skin once a week.     folic acid (FOLVITE) 1 MG tablet Take 1 mg by mouth daily.     loratadine (CLARITIN) 10 MG tablet Take 10 mg by mouth every evening.      losartan (COZAAR) 50 MG tablet Take 1 tablet (50 mg total) by mouth daily. 90 tablet 3   magnesium 30 MG tablet Take 30 mg by mouth daily.     methotrexate (RHEUMATREX) 2.5 MG tablet Take 10 mg by mouth once a week.     mometasone (ELOCON) 0.1 % cream Apply 1  application topically daily as needed (Rash). 15 g 0   Multiple Vitamin (MULTIVITAMIN WITH MINERALS) TABS tablet Take 1 tablet by mouth daily.     No current facility-administered medications on file prior to visit.        ROS:  All others reviewed and negative.  Objective        PE:  BP (!) 160/92   Pulse (!) 52   Temp 98.3 F (36.8 C)   Ht '5\' 6"'$  (1.676 m)   Wt 168 lb (76.2 kg)   SpO2 97%   BMI 27.12 kg/m                 Constitutional: Pt appears in NAD               HENT: Head: NCAT.                Right Ear: External ear normal.                 Left Ear: External ear normal.                Eyes: . Pupils are equal, round, and reactive to light. Conjunctivae and EOM are normal               Nose: without d/c or deformity               Neck: Neck supple. Gross normal ROM               Cardiovascular: Normal rate and regular rhythm.                 Pulmonary/Chest: Effort normal and breath sounds without rales or wheezing.                Abd:  Soft, NT, ND, + BS, no organomegaly               Neurological: Pt is alert. At baseline orientation, motor grossly intact               Skin: Skin is warm. No rashes, no other new lesions, LE edema - none               Psychiatric: Pt behavior is normal without agitation   Micro: none  Cardiac tracings I have personally interpreted today:  none  Pertinent Radiological findings (summarize): none   Lab Results  Component Value Date   WBC 5.7 07/25/2022   HGB 12.7 07/25/2022   HCT 38.7 07/25/2022   PLT 238.0 07/25/2022   GLUCOSE 90 07/25/2022   CHOL 197 07/25/2022   TRIG 77.0  07/25/2022   HDL 52.70 07/25/2022   LDLCALC 129 (H) 07/25/2022   ALT 30 07/25/2022   AST 24 07/25/2022   NA 139 07/25/2022   K 4.0 07/25/2022   CL 103 07/25/2022   CREATININE 1.12 07/25/2022   BUN 15 07/25/2022   CO2 28 07/25/2022   TSH 2.31 01/27/2021   INR 1.5 (H) 01/05/2019   HGBA1C 6.1 07/25/2022   Assessment/Plan:  Jessica [name] is  a 71 y.o. Black or African American [2] female with  has a past medical history of Allergy, Breast cancer ([place]) (2000), Cancer ([place]), Colon polyp, Coronary artery disease, GERD (gastroesophageal reflux disease), History of blood transfusion, Hyperlipidemia, Migraine, and Personal history of radiation therapy (2000).  Pre-diabetes Lab Results  Component Value Date   HGBA1C 6.1 07/25/2022   Stable, pt to continue current medical treatment  - diet, wt control, excercise   HTN (hypertension) BP Readings from Last 3 Encounters:  08/14/22 (!) 160/92  07/25/22 (!) 140/80  07/18/22 (!) 170/96   Uncontrolled, pt to start losartan 50 mg qd, fu BP at home and next visit with PCP in 1-2 wks.  Pt cautioned that this mildly effective med may not control her pressure well, but given her sensitive nature in the past, we should start lower dose.    CAD (coronary artery disease) Asympt, stable overall, pt also plans to f/u cardiology  Followup: Return for follow up PCP 1-2 wks.  Cathlean Cower, MD 08/14/2022 7:21 PM [facility]

## 2022-08-14 NOTE — Assessment & Plan Note (Signed)
Asympt, stable overall, pt also plans to f/u cardiology

## 2022-08-14 NOTE — Assessment & Plan Note (Signed)
Lab Results  Component Value Date   HGBA1C 6.1 07/25/2022   Stable, pt to continue current medical treatment  - diet, wt control, excercise

## 2022-08-14 NOTE — Assessment & Plan Note (Addendum)
BP Readings from Last 3 Encounters:  08/14/22 (!) 160/92  07/25/22 (!) 140/80  07/18/22 (!) 170/96   Uncontrolled, pt to start losartan 50 mg qd, fu BP at home and next visit with PCP in 1-2 wks.  Pt cautioned that this mildly effective med may not control her pressure well, but given her sensitive nature in the past, we should start lower dose.

## 2022-08-14 NOTE — Patient Instructions (Signed)
Please take all new medication as prescribed - the losartan 50 mg per day  Please continue all other medications as before, and refills have been done if requested.  Please have the pharmacy call with any other refills you may need.  Please continue your efforts at being more active, low cholesterol diet, and weight control..  Please keep your appointments with your specialists as you may have planned  Please see PCP in 1-2 wks

## 2022-08-22 DIAGNOSIS — M79643 Pain in unspecified hand: Secondary | ICD-10-CM | POA: Diagnosis not present

## 2022-08-22 DIAGNOSIS — M069 Rheumatoid arthritis, unspecified: Secondary | ICD-10-CM | POA: Diagnosis not present

## 2022-08-22 DIAGNOSIS — M1990 Unspecified osteoarthritis, unspecified site: Secondary | ICD-10-CM | POA: Diagnosis not present

## 2022-08-22 DIAGNOSIS — Z79899 Other long term (current) drug therapy: Secondary | ICD-10-CM | POA: Diagnosis not present

## 2022-08-28 DIAGNOSIS — M654 Radial styloid tenosynovitis [de Quervain]: Secondary | ICD-10-CM | POA: Diagnosis not present

## 2022-08-28 DIAGNOSIS — M79642 Pain in left hand: Secondary | ICD-10-CM | POA: Diagnosis not present

## 2022-08-28 DIAGNOSIS — M79641 Pain in right hand: Secondary | ICD-10-CM | POA: Diagnosis not present

## 2022-08-28 DIAGNOSIS — M65311 Trigger thumb, right thumb: Secondary | ICD-10-CM | POA: Diagnosis not present

## 2022-08-28 DIAGNOSIS — M1812 Unilateral primary osteoarthritis of first carpometacarpal joint, left hand: Secondary | ICD-10-CM | POA: Diagnosis not present

## 2022-09-19 DIAGNOSIS — M79641 Pain in right hand: Secondary | ICD-10-CM | POA: Diagnosis not present

## 2022-09-19 DIAGNOSIS — M654 Radial styloid tenosynovitis [de Quervain]: Secondary | ICD-10-CM | POA: Diagnosis not present

## 2022-09-19 DIAGNOSIS — M65311 Trigger thumb, right thumb: Secondary | ICD-10-CM | POA: Diagnosis not present

## 2022-10-01 ENCOUNTER — Other Ambulatory Visit: Payer: Self-pay | Admitting: Internal Medicine

## 2022-10-01 DIAGNOSIS — Z1231 Encounter for screening mammogram for malignant neoplasm of breast: Secondary | ICD-10-CM

## 2022-10-02 DIAGNOSIS — M79643 Pain in unspecified hand: Secondary | ICD-10-CM | POA: Diagnosis not present

## 2022-10-02 DIAGNOSIS — Z79899 Other long term (current) drug therapy: Secondary | ICD-10-CM | POA: Diagnosis not present

## 2022-10-02 DIAGNOSIS — M069 Rheumatoid arthritis, unspecified: Secondary | ICD-10-CM | POA: Diagnosis not present

## 2022-10-02 DIAGNOSIS — M1990 Unspecified osteoarthritis, unspecified site: Secondary | ICD-10-CM | POA: Diagnosis not present

## 2022-10-09 ENCOUNTER — Ambulatory Visit: Payer: Medicare PPO | Admitting: Internal Medicine

## 2022-10-09 ENCOUNTER — Encounter: Payer: Self-pay | Admitting: Internal Medicine

## 2022-10-09 VITALS — BP 120/80 | HR 54 | Temp 98.5°F | Ht 66.0 in | Wt 173.0 lb

## 2022-10-09 DIAGNOSIS — I739 Peripheral vascular disease, unspecified: Secondary | ICD-10-CM

## 2022-10-09 LAB — COMPREHENSIVE METABOLIC PANEL
ALT: 17 U/L (ref 0–35)
AST: 22 U/L (ref 0–37)
Albumin: 4.3 g/dL (ref 3.5–5.2)
Alkaline Phosphatase: 66 U/L (ref 39–117)
BUN: 14 mg/dL (ref 6–23)
CO2: 29 mEq/L (ref 19–32)
Calcium: 9.7 mg/dL (ref 8.4–10.5)
Chloride: 102 mEq/L (ref 96–112)
Creatinine, Ser: 1.08 mg/dL (ref 0.40–1.20)
GFR: 51.46 mL/min — ABNORMAL LOW (ref >60.00)
Glucose, Bld: 90 mg/dL (ref 70–99)
Potassium: 4.2 mEq/L (ref 3.5–5.1)
Sodium: 138 mEq/L (ref 135–145)
Total Bilirubin: 0.4 mg/dL (ref 0.2–1.2)
Total Protein: 7.6 g/dL (ref 6.0–8.3)

## 2022-10-09 LAB — CBC
HCT: 38.1 % (ref 36.0–46.0)
Hemoglobin: 12.7 g/dL (ref 12.0–15.0)
MCHC: 33.2 g/dL (ref 30.0–36.0)
MCV: 89.1 fl (ref 78.0–100.0)
Platelets: 294 10*3/uL (ref 150.0–400.0)
RBC: 4.28 Mil/uL (ref 3.87–5.11)
RDW: 14.6 % (ref 11.5–15.5)
WBC: 6.1 10*3/uL (ref 4.0–10.5)

## 2022-10-09 LAB — CK: Total CK: 140 U/L (ref 7–177)

## 2022-10-09 LAB — MAGNESIUM: Magnesium: 2 mg/dL (ref 1.5–2.5)

## 2022-10-09 NOTE — Patient Instructions (Addendum)
We will check the labs today.  We will also get the test to check the blood flow in the legs.

## 2022-10-09 NOTE — Progress Notes (Signed)
   Subjective:   Patient ID: Jessica [name], female    DOB: 03-24-1951, 71 y.o.   MRN: [id number]  HPI The patient is a 71 YO female hx CABG with new heaviness and cramping in her legs with walking up stairs and vigorous exercise.   Review of Systems  Constitutional: Negative.   HENT: Negative.    Eyes: Negative.   Respiratory:  Negative for cough, chest tightness and shortness of breath.   Cardiovascular:  Negative for chest pain, palpitations and leg swelling.  Gastrointestinal:  Negative for abdominal distention, abdominal pain, constipation, diarrhea, nausea and vomiting.  Musculoskeletal:  Positive for myalgias.  Skin: Negative.   Neurological:  Positive for weakness.  Psychiatric/Behavioral: Negative.      Objective:  Physical Exam Constitutional:      Appearance: She is well-developed.  HENT:     Head: Normocephalic and atraumatic.  Cardiovascular:     Rate and Rhythm: Normal rate and regular rhythm.  Pulmonary:     Effort: Pulmonary effort is normal. No respiratory distress.     Breath sounds: Normal breath sounds. No wheezing or rales.  Abdominal:     General: Bowel sounds are normal. There is no distension.     Palpations: Abdomen is soft.     Tenderness: There is no abdominal tenderness. There is no rebound.  Musculoskeletal:     Cervical back: Normal range of motion.  Skin:    General: Skin is warm and dry.  Neurological:     Mental Status: She is alert and oriented to person, place, and time.     Coordination: Coordination normal.     Vitals:   10/09/22 0938  BP: 120/80  Pulse: (!) 54  Temp: 98.5 F (36.9 C)  TempSrc: Oral  SpO2: 99%  Weight: 173 lb (78.5 kg)  Height: '5\' 6"'$  (1.676 m)    Assessment & Plan:

## 2022-10-09 NOTE — Assessment & Plan Note (Signed)
Ordered ABI to assess vascular flow. Checking CK and magnesium and CBC and CMP for etiology. Treat as appropriate. Advised to increase magnesium to see if this helps with cramping in leg muscles.

## 2022-10-18 ENCOUNTER — Encounter (HOSPITAL_COMMUNITY): Payer: Self-pay

## 2022-10-18 ENCOUNTER — Ambulatory Visit (HOSPITAL_COMMUNITY)
Admission: RE | Admit: 2022-10-18 | Payer: Medicare PPO | Source: Ambulatory Visit | Attending: Internal Medicine | Admitting: Internal Medicine

## 2022-10-25 ENCOUNTER — Telehealth: Payer: Self-pay | Admitting: Internal Medicine

## 2022-10-25 ENCOUNTER — Telehealth: Payer: Self-pay

## 2022-10-25 NOTE — Telephone Encounter (Signed)
I spoke with Jessica [name] today regarding PT's original ABI order. PT was afraid that her insurance might not cover this and that it would be too expensive if not covered. It has been suggested to PT to possibly do vascular screening instead of Dr.Crawford would see this as sufficient.  CB for PT if needed: [phone]

## 2022-10-26 ENCOUNTER — Ambulatory Visit (HOSPITAL_COMMUNITY)
Admission: RE | Admit: 2022-10-26 | Discharge: 2022-10-26 | Disposition: A | Discharge status: Home / Self Care | Payer: Medicare PPO | Source: Ambulatory Visit | Attending: Cardiology | Admitting: Cardiology

## 2022-10-26 ENCOUNTER — Other Ambulatory Visit: Payer: Self-pay | Admitting: Internal Medicine

## 2022-10-26 DIAGNOSIS — I739 Peripheral vascular disease, unspecified: Secondary | ICD-10-CM

## 2022-10-26 NOTE — Telephone Encounter (Signed)
Ordered. For future if asking to switch test I would like to have location of imaging so I can enter order correctly. Thanks

## 2022-10-26 NOTE — Telephone Encounter (Signed)
Duplicative 

## 2022-10-29 DIAGNOSIS — M65311 Trigger thumb, right thumb: Secondary | ICD-10-CM | POA: Diagnosis not present

## 2022-10-29 DIAGNOSIS — M654 Radial styloid tenosynovitis [de Quervain]: Secondary | ICD-10-CM | POA: Diagnosis not present

## 2022-11-07 ENCOUNTER — Encounter (HOSPITAL_COMMUNITY): Payer: Self-pay | Admitting: Cardiology

## 2022-11-15 ENCOUNTER — Ambulatory Visit: Payer: Medicare PPO | Admitting: Podiatry

## 2022-11-22 DIAGNOSIS — M65342 Trigger finger, left ring finger: Secondary | ICD-10-CM | POA: Diagnosis not present

## 2022-11-22 DIAGNOSIS — M654 Radial styloid tenosynovitis [de Quervain]: Secondary | ICD-10-CM | POA: Diagnosis not present

## 2022-11-22 DIAGNOSIS — M65311 Trigger thumb, right thumb: Secondary | ICD-10-CM | POA: Diagnosis not present

## 2022-11-23 ENCOUNTER — Ambulatory Visit
Admission: RE | Admit: 2022-11-23 | Discharge: 2022-11-23 | Disposition: A | Discharge status: Home / Self Care | Payer: Medicare PPO | Source: Ambulatory Visit | Attending: Internal Medicine | Admitting: Internal Medicine

## 2022-11-23 DIAGNOSIS — Z1231 Encounter for screening mammogram for malignant neoplasm of breast: Secondary | ICD-10-CM

## 2022-11-28 ENCOUNTER — Encounter: Payer: Self-pay | Admitting: Internal Medicine

## 2022-11-28 LAB — HM MAMMOGRAPHY

## 2022-11-30 DIAGNOSIS — M654 Radial styloid tenosynovitis [de Quervain]: Secondary | ICD-10-CM | POA: Diagnosis not present

## 2022-12-10 DIAGNOSIS — M654 Radial styloid tenosynovitis [de Quervain]: Secondary | ICD-10-CM | POA: Diagnosis not present

## 2022-12-10 DIAGNOSIS — M25631 Stiffness of right wrist, not elsewhere classified: Secondary | ICD-10-CM | POA: Diagnosis not present

## 2022-12-25 DIAGNOSIS — M654 Radial styloid tenosynovitis [de Quervain]: Secondary | ICD-10-CM | POA: Diagnosis not present

## 2022-12-25 DIAGNOSIS — R531 Weakness: Secondary | ICD-10-CM | POA: Diagnosis not present

## 2022-12-25 DIAGNOSIS — M25631 Stiffness of right wrist, not elsewhere classified: Secondary | ICD-10-CM | POA: Diagnosis not present

## 2022-12-25 DIAGNOSIS — R609 Edema, unspecified: Secondary | ICD-10-CM | POA: Diagnosis not present

## 2023-01-01 DIAGNOSIS — Z79899 Other long term (current) drug therapy: Secondary | ICD-10-CM | POA: Diagnosis not present

## 2023-01-01 DIAGNOSIS — M069 Rheumatoid arthritis, unspecified: Secondary | ICD-10-CM | POA: Diagnosis not present

## 2023-01-01 DIAGNOSIS — M79643 Pain in unspecified hand: Secondary | ICD-10-CM | POA: Diagnosis not present

## 2023-01-01 DIAGNOSIS — M1990 Unspecified osteoarthritis, unspecified site: Secondary | ICD-10-CM | POA: Diagnosis not present

## 2023-01-02 LAB — LAB REPORT - SCANNED: EGFR: 61

## 2023-01-04 DIAGNOSIS — M069 Rheumatoid arthritis, unspecified: Secondary | ICD-10-CM | POA: Diagnosis not present

## 2023-01-24 ENCOUNTER — Ambulatory Visit: Payer: Medicare PPO | Admitting: Internal Medicine

## 2023-01-24 ENCOUNTER — Encounter: Payer: Self-pay | Admitting: Internal Medicine

## 2023-01-24 VITALS — BP 120/66 | HR 58 | Temp 98.2°F | Ht 66.0 in | Wt 172.0 lb

## 2023-01-24 DIAGNOSIS — R7303 Prediabetes: Secondary | ICD-10-CM | POA: Diagnosis not present

## 2023-01-24 DIAGNOSIS — I10 Essential (primary) hypertension: Secondary | ICD-10-CM | POA: Diagnosis not present

## 2023-01-24 DIAGNOSIS — M7989 Other specified soft tissue disorders: Secondary | ICD-10-CM

## 2023-01-24 DIAGNOSIS — M069 Rheumatoid arthritis, unspecified: Secondary | ICD-10-CM | POA: Diagnosis not present

## 2023-01-24 DIAGNOSIS — E782 Mixed hyperlipidemia: Secondary | ICD-10-CM | POA: Diagnosis not present

## 2023-01-24 LAB — COMPREHENSIVE METABOLIC PANEL
ALT: 8 U/L (ref 0–35)
AST: 17 U/L (ref 0–37)
Albumin: 4 g/dL (ref 3.5–5.2)
Alkaline Phosphatase: 52 U/L (ref 39–117)
BUN: 13 mg/dL (ref 6–23)
CO2: 25 mEq/L (ref 19–32)
Calcium: 9.5 mg/dL (ref 8.4–10.5)
Chloride: 108 mEq/L (ref 96–112)
Creatinine, Ser: 0.91 mg/dL (ref 0.40–1.20)
GFR: 63.07 mL/min (ref >60.00)
Glucose, Bld: 87 mg/dL (ref 70–99)
Potassium: 3.6 mEq/L (ref 3.5–5.1)
Sodium: 141 mEq/L (ref 135–145)
Total Bilirubin: 0.4 mg/dL (ref 0.2–1.2)
Total Protein: 7 g/dL (ref 6.0–8.3)

## 2023-01-24 LAB — LIPID PANEL
Cholesterol: 180 mg/dL (ref 0–200)
HDL: 39.6 mg/dL (ref >39.00)
LDL Cholesterol: 124 mg/dL — ABNORMAL HIGH (ref 0–99)
NonHDL: 140.41
Total CHOL/HDL Ratio: 5
Triglycerides: 81 mg/dL (ref 0.0–149.0)
VLDL: 16.2 mg/dL (ref 0.0–40.0)

## 2023-01-24 LAB — HEMOGLOBIN A1C: Hgb A1c MFr Bld: 6 % (ref 4.6–6.5)

## 2023-01-24 LAB — C-REACTIVE PROTEIN: CRP: 1.6 mg/dL (ref 0.5–20.0)

## 2023-01-24 LAB — BRAIN NATRIURETIC PEPTIDE: Pro B Natriuretic peptide (BNP): 29 pg/mL (ref 0.0–100.0)

## 2023-01-24 NOTE — Patient Instructions (Signed)
We will check the labs today to check the swelling and cholesterol and sugars and inflammation

## 2023-01-24 NOTE — Progress Notes (Signed)
   Subjective:   Patient ID: Jessica [name], female    DOB: 13-Oct-1951, 72 y.o.   MRN: [id number]  HPI The patient is a 72 YO female coming in for new swelling in ankles.   Review of Systems  Constitutional: Negative.   HENT: Negative.    Eyes: Negative.   Respiratory:  Negative for cough, chest tightness and shortness of breath.   Cardiovascular:  Positive for leg swelling. Negative for chest pain and palpitations.  Gastrointestinal:  Negative for abdominal distention, abdominal pain, constipation, diarrhea, nausea and vomiting.  Musculoskeletal: Negative.   Skin: Negative.   Neurological: Negative.   Psychiatric/Behavioral: Negative.      Objective:  Physical Exam Constitutional:      Appearance: She is well-developed.  HENT:     Head: Normocephalic and atraumatic.  Cardiovascular:     Rate and Rhythm: Normal rate and regular rhythm.  Pulmonary:     Effort: Pulmonary effort is normal. No respiratory distress.     Breath sounds: Normal breath sounds. No wheezing or rales.  Abdominal:     General: Bowel sounds are normal. There is no distension.     Palpations: Abdomen is soft.     Tenderness: There is no abdominal tenderness. There is no rebound.  Musculoskeletal:     Cervical back: Normal range of motion.     Comments: Pitting edema 1+ to midshins bilaterally  Skin:    General: Skin is warm and dry.  Neurological:     Mental Status: She is alert and oriented to person, place, and time.     Coordination: Coordination normal.     Vitals:   01/24/23 1058  BP: 120/66  Pulse: (!) 58  Temp: 98.2 F (36.8 C)  TempSrc: Oral  SpO2: 99%  Weight: 172 lb (78 kg)  Height: 5\' 6"  (1.676 m)    Assessment & Plan:

## 2023-01-25 NOTE — Assessment & Plan Note (Signed)
Checking HgA1c for follow up. Adjust as needed. 

## 2023-01-25 NOTE — Assessment & Plan Note (Signed)
Checking CMP and CBC and adjust as needed. BP at goal. Keep losartan 50 mg daily.

## 2023-01-25 NOTE — Assessment & Plan Note (Addendum)
Prior CABG. No SOB on exertion or with lying flat. Checking CMP and BNP to rule out etiology. I feel this may be related to recent prednisone.

## 2023-01-25 NOTE — Assessment & Plan Note (Signed)
Recently given prednisone to take for ankle swelling and then after got both ankles and leg swelling. We discussed how prednisone can cause swelling. She did not have pain with the swelling so likely not RA flare. Checking CRP today to assess.

## 2023-01-25 NOTE — Assessment & Plan Note (Signed)
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

## 2023-01-31 ENCOUNTER — Ambulatory Visit: Payer: Medicare PPO | Admitting: Dermatology

## 2023-01-31 ENCOUNTER — Encounter: Payer: Self-pay | Admitting: Dermatology

## 2023-01-31 DIAGNOSIS — L820 Inflamed seborrheic keratosis: Secondary | ICD-10-CM

## 2023-01-31 DIAGNOSIS — L668 Other cicatricial alopecia: Secondary | ICD-10-CM | POA: Diagnosis not present

## 2023-01-31 MED ORDER — CLOBETASOL PROPIONATE 0.05 % EX SOLN: 1.0000 | Freq: Two times a day (BID) | CUTANEOUS | 2 refills | Status: DC

## 2023-01-31 NOTE — Patient Instructions (Addendum)
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 It is the most common form of scarring hair loss seen in black women. However, it may be seen in men and among persons of all races and hair colour (though rarely). Middle-aged women are most commonly affected.  What is the cause of central centrifugal cicatricial alopecia? The exact cause of CCCA is unknown and is likely multifactorial. A genetic component has been suggested, with a link to mutations of the gene PADI3, which encodes peptidyl arginine deiminase, type III (PADI3), an enzyme that modifies proteins that are essential to formation of the hair-shaft. Hair care practices, such as the use of the hot comb, relaxers, tight extensions and weaves, have been implicated for decades, but studies have not shown a consistent link. Other proposed causative factors include fungal infections, bacterial infections, autoimmune disease, and genetics. One study has shown an association with medical conditions such as type 2 diabetes mellitus.  What are the clinical features of central centrifugal cicatricial alopecia? Hair loss typically begins at the vertex or mid-scalp and extends outward in a centrifugal manner. There is loss of the follicular openings on examination of the scalp. Thus, the scalp may appear shiny. While some persons do not have symptoms, tenderness, itch and burning are common. Hair breakage may also be an early sign of CCCA. Hair loss is slowly progressive. A photographic scale has been developed to rate the severity of the central hair loss.

## 2023-01-31 NOTE — Progress Notes (Signed)
New Patient Visit   Subjective  Jessica [name] is a 72 y.o. female who presents for the following: hair loss  Patient has noticed hair thinning for a few years. Area of concern is on the crown. She has not tried any products or supplements to help with the thinning. Saw a dermatologist about three years ago but decided not to do injection treatments. No other treatment forms were not given. Denies itching in the scalp. Hx of hair relaxer's.   Complaints of skin tags on the face. Denied itching or irritation at this time but the spots do get irritated when using makeup. Patient also complains of a dry spot on the back. Sometimes get itchy with certain clothing.   The following portions of the chart were reviewed this encounter and updated as appropriate: medications, allergies, medical history  Review of Systems:  No other skin or systemic complaints except as noted in HPI or Assessment and Plan.  Objective  Well appearing patient in no apparent distress; mood and affect are within normal limits.  A focused examination was performed of the following areas: Scalp, back, and face  Relevant exam findings are noted in the Assessment and Plan.  Head - Anterior (Face) Stuck-on verrucous, tan-brown papules and plaques. --Discussed benign etiology and prognosis     Assessment & Plan  Central centrifugal cicatricial alopecia  Exam: Hair thinning general to the crown of the scalp caused by scarring   Patient Counseling: Counseled the patient on the following:  The goal of therapy is to halt progression of disease and prevent further hair loss. In areas where the hair follicle has been replaced with fibrosis, regrowth is not possible. As the exact cause is not known, targeted therapy for CCCA is not available.  Treatment options for CCCA include anti-inflammatory agents such as:  -Potent topical steroids (eg clobetasol) or intralesional steroids -Calcineurin inhibitors:  tacrolimus ointment, pimecrolimus cream -Tetracyclines (eg doxycycline 100 mg twice daily, taken for several weeks to months) -Hydroxychloroquine -Hair transplantation can be considered in individuals with well-controlled CCCA for at least one year. However, graft survival is low.  Discontinuation of traumatic hair care practices is an essential aspect of treatment of CCCA.--> Pt currently natural and does not relax her hair or wear tight styles  Continue to Avoid tight braids and weaves/extensions Continue Avoid hair style practices associated with discomfort, scalp irritation or scale Hair transplantation can be considered in individuals with well-controlled CCCA that has been stable for at least one year. However, graft survival is low.   Pt's Treatment Plan:  -Clobetasol Scalp Solution to use in the morning  -OTC Minoxidil 5% solution to use after clobetasol solution -Can start Viviscal supplement   Seborrheic keratosis, inflamed Head - Anterior (Face)  I counseled the patient regarding the following: Skin Care: Seborrheic Keratoses are benign. No treatment is necessary. Expectations: Seborrheic Keratoses are benign warty growths. Patients get more of them as they age.   Destruction of lesion - Head - Anterior (Face) Complexity: simple   Destruction method: cryotherapy   Informed consent: discussed and consent obtained   Timeout:  patient name, date of birth, surgical site, and procedure verified Lesion destroyed using liquid nitrogen: Yes   Region frozen until ice ball extended beyond lesion: Yes   Outcome: patient tolerated procedure well with no complications   Post-procedure details: wound care instructions given           Return in about 3 months (around 05/02/2023) for Alopecia follow up.  Documentation: I have reviewed the above documentation for accuracy and completeness, and I agree with the above.  Langston Reusing, MD   I, Germaine Pomfret, CMA, am acting as  scribe for Langston Reusing, MD.

## 2023-02-01 ENCOUNTER — Emergency Department: Payer: Medicare PPO

## 2023-02-01 ENCOUNTER — Emergency Department
Admission: EM | Admit: 2023-02-01 | Discharge: 2023-02-01 | Disposition: A | Discharge status: Home / Self Care | Payer: Medicare PPO | Attending: Emergency Medicine | Admitting: Emergency Medicine

## 2023-02-01 ENCOUNTER — Encounter: Payer: Self-pay | Admitting: Emergency Medicine

## 2023-02-01 DIAGNOSIS — M79662 Pain in left lower leg: Secondary | ICD-10-CM | POA: Diagnosis not present

## 2023-02-01 DIAGNOSIS — I2510 Atherosclerotic heart disease of native coronary artery without angina pectoris: Secondary | ICD-10-CM | POA: Insufficient documentation

## 2023-02-01 DIAGNOSIS — M7122 Synovial cyst of popliteal space [Baker], left knee: Secondary | ICD-10-CM | POA: Diagnosis not present

## 2023-02-01 DIAGNOSIS — M25462 Effusion, left knee: Secondary | ICD-10-CM | POA: Diagnosis not present

## 2023-02-01 DIAGNOSIS — M25562 Pain in left knee: Secondary | ICD-10-CM | POA: Diagnosis not present

## 2023-02-01 MED ORDER — IBUPROFEN 600 MG PO TABS
600.0000 mg | ORAL_TABLET | Freq: Once | ORAL | Status: AC
  Administered 2023-02-01: 600 mg via ORAL
  Filled 2023-02-01: qty 1

## 2023-02-01 NOTE — ED Notes (Addendum)
AVS provided to and discussed with patient. Pt verbalizes understanding of discharge instructions and denies any questions or concerns at this time. Pt ambulated out of department independently with steady gait.  

## 2023-02-01 NOTE — ED Provider Notes (Signed)
[facility] Provider Note    Event Date/Time   First MD Initiated Contact with Patient 02/01/23 [phone]     (approximate)   History   Knee Pain   HPI  Jessica [name] is a 72 y.o. female history of coronary artery bypass peripheral vascular disease with redness  Patient reports pain and discomfort since Monday.  She reports she did a more exertional exercise routine than typical.  Started having pain in her left knee mostly behind it.  No discomfortable to sit still.  When she tries to bear weight on it movement about it hurts causing pain mostly in the back right behind the knee.  No fall or injury  No chest pain no shortness of breath.  No fevers or chills.  The area is not been red does not appear particularly swollen  Normal sensation.  No cold or blue foot.  No pain in any other areas such as her hip or back     Physical Exam   Triage Vital Signs: ED Triage Vitals  Enc Vitals Group     BP 02/01/23 0634 137/78     Pulse Rate 02/01/23 0634 70     Resp 02/01/23 0634 18     Temp 02/01/23 0634 98 F (36.7 C)     Temp Source 02/01/23 0634 Oral     SpO2 02/01/23 0634 98 %     Weight 02/01/23 0630 168 lb (76.2 kg)     Height 02/01/23 0630 5\' 6"  (1.676 m)     Head Circumference --      Peak Flow --      Pain Score 02/01/23 0630 9     Pain Loc --      Pain Edu? --      Excl. in GC? --     Most recent vital signs: Vitals:   02/01/23 0634  BP: 137/78  Pulse: 70  Resp: 18  Temp: 98 F (36.7 C)  SpO2: 98%     General: Awake, no distress.  Extremely pleasant.  Reports 0 pain when laying still. CV:  Good peripheral perfusion.  Strong palpable left posterior tibial and dorsalis pedis pulses.  Warm well-perfused left foot.  Normal sensation and use of the left foot Resp:  Normal effort.  Abd:  No distention.  Other:  No pain to palpation around the left hip or trochanter.  She demonstrates good range of motion of the hip without pain.   She also demonstrates no bony tenderness injury deformity or other findings overlying any aspect of the skin of the left lower extremity.  She does have some slight swelling posterior to the left knee joint, but not obvious large or significant joint effusion.  There is no erythema.  The joint itself does not appear tender to the touch except she does report some tenderness in the posterior region of the left knee.  She is able to move and sit herself up to the side of the bed, but reports it is painful when she bends the need to bring it over  Able to flex and extend at the foot without difficulty.  There is no tenderness over the calf.  Compartments are all soft  ED Results / Procedures / Treatments   Labs (all labs ordered are listed, but only abnormal results are displayed) Labs Reviewed - No data to display   EKG     RADIOLOGY    Left knee x-ray interpreted by me as negative for  acute finding, very small effusion suspected  DG Knee Complete 4 Views Left  Result Date: 02/01/2023 CLINICAL DATA:  Left knee pain that started 3 days ago. EXAM: LEFT KNEE - COMPLETE 4 VIEW COMPARISON:  Knee radiograph 08/25/2020 FINDINGS: There is a small knee joint effusion. No dislocation. The medial tibial plateau appears slightly depressed compared to 08/25/2020. There are mild patellofemoral compartment predominant degenerative changes. Vascular calcifications. IMPRESSION: 1.  Small nonspecific knee joint effusion. 2. The medial tibial plateau appears slightly depressed compared to prior exam. While this could be positional, if there is concern for an insufficiency fracture, further evaluation with a CT could be considered. Electronically Signed   By: Lorenza Cambridge M.D.   On: 02/01/2023 07:02     PROCEDURES:  Critical Care performed: No  Procedures   MEDICATIONS ORDERED IN ED: Medications  ibuprofen (ADVIL) tablet 600 mg (600 mg Oral Given 02/01/23 0928)     IMPRESSION / MDM / ASSESSMENT  AND PLAN / ED COURSE  I reviewed the triage vital signs and the nursing notes.                              Differential diagnosis includes, but is not limited to, strain, Baker's cyst, overuse, arthritic pain, tenderness strain, do not see evidence to suggest infectious cause or major traumatic injury.  Normal neuro and vascular examination.   Offered to provide patient pain medication including prescription pain medicine, but she reports that in the past she uses Tylenol and ibuprofen but does not wish for any prescription pain medication.  She has not tried any of those to alleviate her pain yet today, but also does not want to take anything at this time  Patient's presentation is most consistent with acute complicated illness / injury requiring diagnostic workup.   Workup appears to be most suggestive of Baker's cyst, likely with some element of rupture but no evidence of superinfection or complication.  Discussed with patient, she does not wish for any prescription pain medications, wishes to utilize Tylenol and ibuprofen and will consider use of a cane or walker and follow-up with orthopedics  Return precautions and treatment recommendations and follow-up discussed with the patient who is agreeable with the plan.      FINAL CLINICAL IMPRESSION(S) / ED DIAGNOSES   Final diagnoses:  Baker's cyst of knee, left     Rx / DC Orders   ED Discharge Orders     None        Note:  This document was prepared using Dragon voice recognition software and may include unintentional dictation errors.   Sharyn Creamer, MD 02/01/23 1511

## 2023-02-01 NOTE — ED Triage Notes (Signed)
Pt presents ambulatory to triage via POV with complaints of L knee pain that started 2-3 days ago. She notes that her knee feels sore when weight-bearing. No pain meds were taken PTA - rates the pain 9/10. A&Ox4 at this time. Denies hx of DVTs, falls, injury, CP or SOB.

## 2023-02-01 NOTE — ED Notes (Signed)
US at bedside

## 2023-02-04 ENCOUNTER — Encounter: Payer: Self-pay | Admitting: Dermatology

## 2023-02-05 ENCOUNTER — Telehealth: Payer: Self-pay

## 2023-02-05 NOTE — Progress Notes (Unsigned)
   Rubin Payor, PhD, LAT, ATC acting as a scribe for [name], MD.  Jessica [name] is a 72 y.o. female who presents to [facility] at [facility] today for L knee pain. Pt was previously seen by Dr. Denyse Amass on 04/17/22 for R trigger thumb.  Today, pt c/o L knee pain ongoing since Monday, 4/8. Pt noticed her L knee pain after doing a more rigorous exercise routine. She was seen at the [facility] ED on 4/12 for this complaint. Pt locates pain to ***  Dx testing: 02/01/23 L LE vascu US  02/01/23 L knee CT & L knee XR  08/25/20 L knee XR  Pertinent review of systems: ***  Relevant historical information: ***   Exam:  There were no vitals taken for this visit. General: Well Developed, well nourished, and in no acute distress.   MSK: ***    Lab and Radiology Results No results found for this or any previous visit (from the past 72 hour(s)). No results found.     Assessment and Plan: 72 y.o. female with ***   PDMP not reviewed this encounter. No orders of the defined types were placed in this encounter.  No orders of the defined types were placed in this encounter.    Discussed warning signs or symptoms. Please see discharge instructions. Patient expresses understanding.   ***

## 2023-02-05 NOTE — Telephone Encounter (Signed)
     Patient  visit on 02/01/2023  at [facility] was for Knee Pain.  Have you been able to follow up with your primary care physician? Yes patient has appointment with Orthopedic Surgeon.  The patient was or was not able to obtain any needed medicine or equipment. No medication prescribed.  Are there diet recommendations that you are having difficulty following? No  Patient expresses understanding of discharge instructions and education provided has no other needs at this time. Yes   [name]  [facility] Population Health Community Resource Care Guide   [email or web address]  ?? [phone]   Website: [email or web address]  [email or web address]

## 2023-02-06 ENCOUNTER — Other Ambulatory Visit: Payer: Self-pay

## 2023-02-06 ENCOUNTER — Ambulatory Visit: Payer: Medicare PPO | Admitting: Family Medicine

## 2023-02-06 ENCOUNTER — Encounter: Payer: Self-pay | Admitting: Family Medicine

## 2023-02-06 ENCOUNTER — Other Ambulatory Visit: Payer: Self-pay | Admitting: Family Medicine

## 2023-02-06 VITALS — BP 130/80 | HR 63 | Ht 66.0 in | Wt 169.0 lb

## 2023-02-06 DIAGNOSIS — M25562 Pain in left knee: Secondary | ICD-10-CM | POA: Diagnosis not present

## 2023-02-06 DIAGNOSIS — R600 Localized edema: Secondary | ICD-10-CM

## 2023-02-06 DIAGNOSIS — M7122 Synovial cyst of popliteal space [Baker], left knee: Secondary | ICD-10-CM

## 2023-02-06 DIAGNOSIS — G8929 Other chronic pain: Secondary | ICD-10-CM | POA: Diagnosis not present

## 2023-02-06 MED ORDER — FUROSEMIDE 20 MG PO TABS: 20.0000 mg | ORAL_TABLET | Freq: Every day | ORAL | 0 refills | Status: DC

## 2023-02-06 MED ORDER — HYDROCODONE-ACETAMINOPHEN 5-325 MG PO TABS: 1.0000 | ORAL_TABLET | Freq: Three times a day (TID) | ORAL | 0 refills | Status: DC | PRN

## 2023-02-06 NOTE — Patient Instructions (Addendum)
Thank you for coming in today.   You received an injection today. Seek immediate medical attention if the joint becomes red, extremely painful, or is oozing fluid.   Start the furosemide (lasix) for the leg swelling.   Recheck in 2 weeks.   Use hydrocodone for severe pain sparingly.   Try compression stockings.

## 2023-02-13 NOTE — Progress Notes (Deleted)
   Rubin Payor, PhD, LAT, ATC acting as a scribe for Jessica Graham, MD.  Jessica [name] is a 72 y.o. female who presents to [facility] at [facility] today for bilateral knee pain. Pt was last seen by Dr. Denyse Amass on 02/06/23 for her L knee and bilat LE edema. Her L knee Baker's cyst was unsuccessfully attempted to be aspirated.  Today, pt c/o both knees are hurting her and the pain seems "different." ***  Dx testing: 02/01/23 L LE vascu US             02/01/23 L knee CT & L knee XR             08/25/20 L knee XR  Pertinent review of systems: ***  Relevant historical information: ***   Exam:  There were no vitals taken for this visit. General: Well Developed, well nourished, and in no acute distress.   MSK: ***    Lab and Radiology Results No results found for this or any previous visit (from the past 72 hour(s)). No results found.     Assessment and Plan: 72 y.o. female with ***   PDMP not reviewed this encounter. No orders of the defined types were placed in this encounter.  No orders of the defined types were placed in this encounter.    Discussed warning signs or symptoms. Please see discharge instructions. Patient expresses understanding.   ***

## 2023-02-14 ENCOUNTER — Ambulatory Visit: Payer: Medicare PPO | Admitting: Family Medicine

## 2023-02-18 NOTE — Progress Notes (Signed)
   Rubin Payor, PhD, LAT, ATC acting as a scribe for [name], MD.  Jessica [name] is a 72 y.o. female who presents to [facility] at [facility] today for f/u L knee pain and bilat LE swelling. Pt was last seen by Dr. Denyse Amass on 02/06/23 and her L Baker's cysts was attempted, but failed to be aspirated. She was prescribe oxycodone, [facility] Better, and advised to use compression stockings.   Today, pt reports ***  Dx testing: 02/01/23 L LE vascu US             02/01/23 L knee CT & L knee XR             08/25/20 L knee XR  Pertinent review of systems: ***  Relevant historical information: ***   Exam:  There were no vitals taken for this visit. General: Well Developed, well nourished, and in no acute distress.   MSK: ***    Lab and Radiology Results No results found for this or any previous visit (from the past 72 hour(s)). No results found.     Assessment and Plan: 72 y.o. female with ***   PDMP not reviewed this encounter. No orders of the defined types were placed in this encounter.  No orders of the defined types were placed in this encounter.    Discussed warning signs or symptoms. Please see discharge instructions. Patient expresses understanding.   ***

## 2023-02-19 ENCOUNTER — Ambulatory Visit: Payer: Medicare PPO | Admitting: Family Medicine

## 2023-02-19 ENCOUNTER — Other Ambulatory Visit: Payer: Self-pay

## 2023-02-19 ENCOUNTER — Ambulatory Visit (INDEPENDENT_AMBULATORY_CARE_PROVIDER_SITE_OTHER): Payer: Medicare PPO

## 2023-02-19 VITALS — BP 132/84 | HR 67 | Ht 66.0 in | Wt 169.0 lb

## 2023-02-19 DIAGNOSIS — M25562 Pain in left knee: Secondary | ICD-10-CM

## 2023-02-19 DIAGNOSIS — M5137 Other intervertebral disc degeneration, lumbosacral region: Secondary | ICD-10-CM | POA: Diagnosis not present

## 2023-02-19 DIAGNOSIS — M25561 Pain in right knee: Secondary | ICD-10-CM | POA: Diagnosis not present

## 2023-02-19 DIAGNOSIS — M5441 Lumbago with sciatica, right side: Secondary | ICD-10-CM | POA: Diagnosis not present

## 2023-02-19 DIAGNOSIS — G8929 Other chronic pain: Secondary | ICD-10-CM

## 2023-02-19 DIAGNOSIS — M545 Low back pain, unspecified: Secondary | ICD-10-CM | POA: Diagnosis not present

## 2023-02-19 MED ORDER — GABAPENTIN 100 MG PO CAPS: 100.0000 mg | ORAL_CAPSULE | Freq: Every evening | ORAL | 3 refills | Status: DC | PRN

## 2023-02-19 NOTE — Patient Instructions (Signed)
Thank you for coming in today.   Please get an Xray today before you leave   You received an injection today. Seek immediate medical attention if the joint becomes red, extremely painful, or is oozing fluid.   I've sent a prescription for Gabapentin to your pharmacy.   Check back in 2 weeks

## 2023-02-20 ENCOUNTER — Ambulatory Visit: Payer: Medicare PPO | Admitting: Family Medicine

## 2023-02-25 NOTE — Progress Notes (Signed)
Lumbar spine x-ray shows arthritis changes at the base of the spine.

## 2023-02-25 NOTE — Progress Notes (Signed)
Right knee x-ray shows a little bit of arthritis changes.

## 2023-03-04 NOTE — Progress Notes (Unsigned)
   Rubin Payor, PhD, LAT, ATC acting as a scribe for [name], MD.  Bettey Palmatier is a 72 y.o. female who presents to [facility] at [facility] today for 2-wk f/u bilat knee pain, lumbar radiculopathy, and LE swelling. Pt was last seen by Dr. Denyse Amass on 02/19/23 and was given knee steroid injections and was prescribed gabapentin. Today, pt reports significant improvement of bilat knee sx. Still gets occasional pain deep to patella in right knee with ambulation, 1/10 pain. Denies swelling, notes that swelling around the ankles has also improved.   Dx testing: 02/19/23 L-spine & R knee XR 02/01/23 L LE vasc US             02/01/23 L knee CT & L knee XR             08/25/20 L knee XR  Pertinent review of systems: No fevers or chills  Relevant historical information: Hypertension and CAD.  Rheumatoid arthritis.   Exam:  BP (!) 150/88   Pulse (!) 51   Ht 5\' 6"  (1.676 m)   Wt 164 lb 12.8 oz (74.8 kg)   SpO2 100%   BMI 26.60 kg/m  General: Well Developed, well nourished, and in no acute distress.   MSK: Left knee nontender normal motion. Right knee: Nontender normal motion.       Assessment and Plan: 72 y.o. female with bilateral knee pain thought to be due to exacerbation of DJD.  She does have Baker's cyst as well which could be a factor.  She had an attempted a Baker's cyst aspiration April 17 that was unsuccessful. Follow-up bilateral intra-articular knee injection April 30 with corticosteroids was quite helpful.  Plan for watchful waiting for now.  Can repeat this same steroid injection both knees on or after late July if needed.  She will let us know how she is feeling.  If needed we could proceed with hyaluronic acid injections or Zilretta injection.  We could even do a direct injection into the Baker's cyst if needed.     Discussed warning signs or symptoms. Please see discharge instructions. Patient expresses understanding.   The above  documentation has been reviewed and is accurate and complete [name], M.D.

## 2023-03-05 ENCOUNTER — Ambulatory Visit: Payer: Medicare PPO | Admitting: Family Medicine

## 2023-03-05 ENCOUNTER — Encounter: Payer: Self-pay | Admitting: Family Medicine

## 2023-03-05 VITALS — BP 150/88 | HR 51 | Ht 66.0 in | Wt 164.8 lb

## 2023-03-05 DIAGNOSIS — M25561 Pain in right knee: Secondary | ICD-10-CM

## 2023-03-05 DIAGNOSIS — G8929 Other chronic pain: Secondary | ICD-10-CM | POA: Diagnosis not present

## 2023-03-05 DIAGNOSIS — M25562 Pain in left knee: Secondary | ICD-10-CM | POA: Diagnosis not present

## 2023-03-05 NOTE — Patient Instructions (Signed)
Thank you for coming in today.   We have more to do if needed but you seem better.   Recheck as needed.   I can do the same injection every 3 months which would be late July or early Aug.

## 2023-03-19 ENCOUNTER — Encounter: Payer: Self-pay | Admitting: Internal Medicine

## 2023-03-25 DIAGNOSIS — M79642 Pain in left hand: Secondary | ICD-10-CM | POA: Diagnosis not present

## 2023-03-25 DIAGNOSIS — M65311 Trigger thumb, right thumb: Secondary | ICD-10-CM | POA: Diagnosis not present

## 2023-04-04 ENCOUNTER — Ambulatory Visit (INDEPENDENT_AMBULATORY_CARE_PROVIDER_SITE_OTHER): Payer: Medicare PPO

## 2023-04-04 VITALS — Ht 66.0 in | Wt 161.0 lb

## 2023-04-04 DIAGNOSIS — Z13820 Encounter for screening for osteoporosis: Secondary | ICD-10-CM | POA: Diagnosis not present

## 2023-04-04 DIAGNOSIS — Z0000 Encounter for general adult medical examination without abnormal findings: Secondary | ICD-10-CM | POA: Diagnosis not present

## 2023-04-04 NOTE — Progress Notes (Signed)
I connected with  Jessica [name] on 04/04/23 by a audio enabled telemedicine application and verified that I am speaking with the correct person using two identifiers.  Patient Location: Home  Provider Location: Office/Clinic  I discussed the limitations of evaluation and management by telemedicine. The patient expressed understanding and agreed to proceed.  Patient Medicare AWV questionnaire was completed by the patient on 04/03/2023; I have confirmed that all information answered by patient is correct and no changes since this date.     Subjective:   Jessica [name] is a 72 y.o. female who presents for Medicare Annual (Subsequent) preventive examination.  Review of Systems     Cardiac Risk Factors include: advanced age (>79men, >68 women);hypertension;dyslipidemia;family history of premature cardiovascular disease     Objective:    Today's Vitals   04/04/23 1303 04/04/23 1304  Weight: 161 lb (73 kg)   Height: 5\' 6"  (1.676 m)   PainSc: 0-No pain 0-No pain   Body mass index is 25.99 kg/m.     04/04/2023    1:10 PM 03/12/2022    2:35 PM 11/29/2021    7:22 AM 10/03/2021    8:25 AM 01/02/2021    2:59 PM 08/25/2020    4:23 PM 02/18/2020    5:58 AM  Advanced Directives  Does Patient Have a Medical Advance Directive? Yes No No Yes No No No  Type of Estate agent of [place];Living will   Healthcare Power of [place];Living will     Does patient want to make changes to medical advance directive?    No - Patient declined     Copy of Healthcare Power of Attorney in Chart? No - copy requested   No - copy requested     Would patient like information on creating a medical advance directive?  No - Patient declined    No - Patient declined No - Patient declined    Current Medications (verified) Outpatient Encounter Medications as of 04/04/2023  Medication Sig   Alpha-D-Galactosidase (ECK FOOD ENZYME PO) Take by mouth. 1 tablet po every day   Ascorbic  Acid (VITAMIN C) 1000 MG tablet Take 2,000 mg by mouth daily.   Cholecalciferol (VITAMIN D-3) 125 MCG (5000 UT) TABS Take 5,000 Units by mouth daily.   loratadine (CLARITIN) 10 MG tablet Take 10 mg by mouth every evening.    magnesium 30 MG tablet Take 30 mg by mouth daily.   Multiple Vitamin (MULTIVITAMIN WITH MINERALS) TABS tablet Take 1 tablet by mouth daily.   Omega-3 Fatty Acids (OMEGA-3 EPA FISH OIL PO) Take by mouth. 1 capsule po every day   clobetasol (TEMOVATE) 0.05 % external solution Apply 1 Application topically 2 (two) times daily. Apply topically to the scalp in the morning (Patient not taking: Reported on 04/04/2023)   etanercept (ENBREL) 50 MG/ML injection Inject 50 mg into the skin once a week. (Patient not taking: Reported on 04/04/2023)   folic acid (FOLVITE) 1 MG tablet Take 1 mg by mouth daily. (Patient not taking: Reported on 04/04/2023)   furosemide (LASIX) 20 MG tablet TAKE 1 TABLET(20 MG) BY MOUTH DAILY (Patient not taking: Reported on 04/04/2023)   gabapentin (NEURONTIN) 100 MG capsule Take 1-3 capsules (100-300 mg total) by mouth at bedtime as needed. (Patient not taking: Reported on 04/04/2023)   HYDROcodone-acetaminophen (NORCO/VICODIN) 5-325 MG tablet Take 1 tablet by mouth every 8 (eight) hours as needed for moderate pain. (Patient not taking: Reported on 04/04/2023)   losartan (COZAAR) 50 MG tablet  Take 1 tablet (50 mg total) by mouth daily. (Patient not taking: Reported on 04/04/2023)   Magnesium Gluconate 550 MG TABS 1 tablet with a meal Orally Once a day for 30 day(s) (Patient not taking: Reported on 04/04/2023)   Multiple Vitamin (MULTI VITAMIN) TABS 1 tablet Orally Once a day for 30 day(s) (Patient not taking: Reported on 04/04/2023)   predniSONE (DELTASONE) 10 MG tablet  (Patient not taking: Reported on 04/04/2023)   No facility-administered encounter medications on file as of 04/04/2023.    Allergies (verified) Codeine, Repatha [evolocumab], Sulfa antibiotics, and  Sulfacetamide   History: Past Medical History:  Diagnosis Date   Allergy    Breast cancer (HCC) 2000   Cancer (HCC)    breast   Colon polyp    Coronary artery disease    GERD (gastroesophageal reflux disease)    History of blood transfusion    Hyperlipidemia    Migraine    history of migrarines, none as an adult   Personal history of radiation therapy 2000   Past Surgical History:  Procedure Laterality Date   ABDOMINAL HYSTERECTOMY     BREAST LUMPECTOMY Right 2000   BREAST SURGERY Right    calcifaction removed   BUNIONECTOMY Bilateral    COLONOSCOPY W/ POLYPECTOMY     CORONARY ARTERY BYPASS GRAFT N/A 01/05/2019   Procedure: CORONARY ARTERY BYPASS GRAFTING (CABG), using right leg saphenous endoscopic and open vein harvest, exploration left leg;  Surgeon: Alleen Borne, MD;  Location: [facility] OR;  Service: Open Heart Surgery;  Laterality: N/A;   EYE SURGERY Bilateral    cataract   hemorrhoid     LEFT HEART CATH AND CORONARY ANGIOGRAPHY N/A 01/01/2019   Procedure: LEFT HEART CATH AND CORONARY ANGIOGRAPHY;  Surgeon: Corky Crafts, MD;  Location: [facility] INVASIVE CV LAB;  Service: Cardiovascular;  Laterality: N/A;   STERNAL WIRES REMOVAL N/A 05/11/2019   Procedure: STERNAL WIRES REMOVAL;  Surgeon: Alleen Borne, MD;  Location: [facility] OR;  Service: Thoracic;  Laterality: N/A;   TEE WITHOUT CARDIOVERSION N/A 01/05/2019   Procedure: TRANSESOPHAGEAL ECHOCARDIOGRAM (TEE);  Surgeon: Alleen Borne, MD;  Location: [facility] OR;  Service: Open Heart Surgery;  Laterality: N/A;   Family History  Problem Relation Age of Onset   Heart disease Brother    Asthma Paternal Aunt    Diabetes Paternal Aunt    Stroke Paternal Uncle    Kidney disease Paternal Uncle    Heart attack Father 83   Social History   Socioeconomic History   Marital status: Married    Spouse name: Not on file   Number of children: Not on file   Years of education: Not on file   Highest education level: Master's degree (e.g.,  MA, MS, MEng, MEd, MSW, MBA)  Occupational History   Not on file  Tobacco Use   Smoking status: Never   Smokeless tobacco: Never  Vaping Use   Vaping Use: Never used  Substance and Sexual Activity   Alcohol use: No   Drug use: No   Sexual activity: Yes  Other Topics Concern   Not on file  Social History Narrative   Not on file   Social Determinants of Health   Financial Resource Strain: Low Risk  (04/04/2023)   Overall Financial Resource Strain (CARDIA)    Difficulty of Paying Living Expenses: Not hard at all  Food Insecurity: No Food Insecurity (04/04/2023)   Hunger Vital Sign    Worried About Programme researcher, broadcasting/film/video in  the Last Year: Never true    Ran Out of Food in the Last Year: Never true  Transportation Needs: No Transportation Needs (04/04/2023)   PRAPARE - Administrator, Civil Service (Medical): No    Lack of Transportation (Non-Medical): No  Physical Activity: Insufficiently Active (04/04/2023)   Exercise Vital Sign    Days of Exercise per Week: 3 days    Minutes of Exercise per Session: 30 min  Stress: No Stress Concern Present (04/04/2023)   [employer] of Occupational Health - Occupational Stress Questionnaire    Feeling of Stress : Not at all  Social Connections: Moderately Integrated (04/04/2023)   Social Connection and Isolation Panel [NHANES]    Frequency of Communication with Friends and Family: Once a week    Frequency of Social Gatherings with Friends and Family: Once a week    Attends Religious Services: More than 4 times per year    Active Member of [employer] or Organizations: Yes    Attends Engineer, structural: More than 4 times per year    Marital Status: Married    Tobacco Counseling Counseling given: Not Answered   Clinical Intake:  Pre-visit preparation completed: Yes  Pain : No/denies pain Pain Score: 0-No pain     BMI - recorded: 25.99 Nutritional Status: BMI 25 -29 Overweight Nutritional Risks: None Diabetes:  No  How often do you need to have someone help you when you read instructions, pamphlets, or other written materials from your doctor or pharmacy?: 1 - Never What is the last grade level you completed in school?: College Degree  Diabetic? No  Interpreter Needed?: No  Information entered by :: Quentyn Kolbeck N. Tylisa Alcivar, LPN.   Activities of Daily Living    04/04/2023    1:12 PM 04/03/2023    8:30 AM  In your present state of health, do you have any difficulty performing the following activities:  Hearing? 0 0  Vision? 0 0  Difficulty concentrating or making decisions? 0 0  Walking or climbing stairs? 0 0  Dressing or bathing? 0 0  Doing errands, shopping? 0 0  Preparing Food and eating ? N N  Using the Toilet? N N  In the past six months, have you accidently leaked urine? N N  Do you have problems with loss of bowel control? N N  Managing your Medications? N N  Managing your Finances? N N  Housekeeping or managing your Housekeeping? N N    Patient Care Team: Myrlene Broker, MD as PCP - General (Internal Medicine) Runell Gess, MD as PCP - Cardiology (Cardiology)  Indicate any recent Medical Services you may have received from other than [facility] providers in the past year (date may be approximate).     Assessment:   This is a routine wellness examination for [place].  Hearing/Vision screen Hearing Screening - Comments:: Denies hearing difficulties   Vision Screening - Comments:: Wears reading glasses - up to date with routine eye exams with [facility]   Dietary issues and exercise activities discussed: Current Exercise Habits: Home exercise routine, Type of exercise: walking;treadmill;stretching;strength training/weights;Other - see comments ([employer]), Time (Minutes): 30, Frequency (Times/Week): 3, Weekly Exercise (Minutes/Week): 90, Intensity: Moderate   Goals Addressed             This Visit's Progress    My goal for 2024 is to increase my physical  activity.        Depression Screen    04/04/2023  1:12 PM 01/24/2023   11:02 AM 01/24/2023   11:01 AM 10/09/2022    9:38 AM 05/01/2022    1:12 PM 03/12/2022    2:42 PM 03/06/2022   10:14 AM  PHQ 2/9 Scores  PHQ - 2 Score 0 0 0 0 0 0 0  PHQ- 9 Score 0 1  0 0 0 1    Fall Risk    04/04/2023    1:10 PM 04/03/2023    8:30 AM 01/24/2023   11:01 AM 10/09/2022    9:37 AM 05/01/2022    1:12 PM  Fall Risk   Falls in the past year? 0 0 0 0 0  Number falls in past yr: 0  0 0 0  Injury with Fall? 0 0 0 0 0  Risk for fall due to : No Fall Risks      Follow up Falls prevention discussed  Falls evaluation completed Falls evaluation completed     FALL RISK PREVENTION PERTAINING TO THE HOME:  Any stairs in or around the home? Yes  If so, are there any without handrails? No  Home free of loose throw rugs in walkways, pet beds, electrical cords, etc? Yes  Adequate lighting in your home to reduce risk of falls? Yes   ASSISTIVE DEVICES UTILIZED TO PREVENT FALLS:  Life alert? No  Use of a cane, walker or w/c? No  Grab bars in the bathroom? No  Shower chair or bench in shower? Yes  Elevated toilet seat or a handicapped toilet? No   TIMED UP AND GO:  Was the test performed? No . Telephonic Visit  Cognitive Function:        04/04/2023    1:19 PM 03/12/2022    2:49 PM  6CIT Screen  What Year? 0 points 0 points  What month? 0 points 0 points  What time? 0 points 0 points  Count back from 20 0 points 0 points  Months in reverse 0 points 0 points  Repeat phrase 0 points 0 points  Total Score 0 points 0 points    Immunizations Immunization History  Administered Date(s) Administered   Fluad Quad(high Dose 65+) 06/16/2019, 08/08/2020, 07/14/2021, 07/03/2022   Influenza, High Dose Seasonal PF 07/27/2016, 06/27/2017, 09/17/2018   Influenza, Seasonal, Injecte, Preservative Fre 08/09/2014   Influenza,inj,Quad PF,6+ Mos 08/17/2015   Influenza-Unspecified 07/27/2016   Moderna Sars-Covid-2  Vaccination 01/02/2020, 02/02/2020, 08/18/2020   Pneumococcal Conjugate-13 07/27/2016   Pneumococcal Polysaccharide-23 09/16/2017   Tdap 02/03/2005, 10/22/2006, 10/02/2016   Zoster Recombinat (Shingrix) 10/25/2020, 12/22/2020    TDAP status: Up to date  Flu Vaccine status: Up to date  Pneumococcal vaccine status: Up to date  Covid-19 vaccine status: Completed vaccines  Qualifies for Shingles Vaccine? Yes   Zostavax completed No   Shingrix Completed?: Yes  Screening Tests Health Maintenance  Topic Date Due   COVID-19 Vaccine (4 - 2023-24 season) 06/22/2022   INFLUENZA VACCINE  05/23/2023   Colonoscopy  02/04/2024   Medicare Annual Wellness (AWV)  04/03/2024   MAMMOGRAM  11/28/2024   DTaP/Tdap/Td (4 - Td or Tdap) 10/02/2026   Pneumonia Vaccine 60+ Years old  Completed   DEXA SCAN  Completed   Hepatitis C Screening  Completed   Zoster Vaccines- Shingrix  Completed   HPV VACCINES  Aged Out    Health Maintenance  Health Maintenance Due  Topic Date Due   COVID-19 Vaccine (4 - 2023-24 season) 06/22/2022    Colorectal cancer screening: Type of screening: Colonoscopy. Completed 02/03/2014. Repeat  every 10 years  Mammogram status: Completed 11/28/2022. Repeat every year  Bone Density status: Ordered 04/04/2023. Pt provided with contact info and advised to call to schedule appt.  Lung Cancer Screening: (Low Dose CT Chest recommended if Age 64-80 years, 30 pack-year currently smoking OR have quit w/in 15years.) does not qualify.   Lung Cancer Screening Referral: no  Additional Screening:  Hepatitis C Screening: does qualify; Completed 10/02/2016  Vision Screening: Recommended annual ophthalmology exams for early detection of glaucoma and other disorders of the eye. Is the patient up to date with their annual eye exam?  Yes  Who is the provider or what is the name of the office in which the patient attends annual eye exams? [facility] If pt is not established with a  provider, would they like to be referred to a provider to establish care? No .   Dental Screening: Recommended annual dental exams for proper oral hygiene  Community Resource Referral / Chronic Care Management: CRR required this visit?  No   CCM required this visit?  No      Plan:     I have personally reviewed and noted the following in the patient's chart:   Medical and social history Use of alcohol, tobacco or illicit drugs  Current medications and supplements including opioid prescriptions. Patient is not currently taking opioid prescriptions. Functional ability and status Nutritional status Physical activity Advanced directives List of other physicians Hospitalizations, surgeries, and ER visits in previous 12 months Vitals Screenings to include cognitive, depression, and falls Referrals and appointments  In addition, I have reviewed and discussed with patient certain preventive protocols, quality metrics, and best practice recommendations. A written personalized care plan for preventive services as well as general preventive health recommendations were provided to patient.     Mickeal Needy, LPN   1/[id number]/[id number]   Nurse Notes:  Normal cognitive status assessed by direct observation via telephone conversation by this Nurse Health Advisor. No abnormalities found.

## 2023-04-04 NOTE — Patient Instructions (Addendum)
Ms. Jessica [name] , Thank you for taking time to come for your Medicare Wellness Visit. I appreciate your ongoing commitment to your health goals. Please review the following plan we discussed and let me know if I can assist you in the future.   These are the goals we discussed:  Goals      My goal for 2024 is to increase my physical activity.        This is a list of the screening recommended for you and due dates:  Health Maintenance  Topic Date Due   COVID-19 Vaccine (4 - 2023-24 season) 06/22/2022   Flu Shot  05/23/2023   Colon Cancer Screening  02/04/2024   Medicare Annual Wellness Visit  04/03/2024   Mammogram  11/28/2024   DTaP/Tdap/Td vaccine (4 - Td or Tdap) 10/02/2026   Pneumonia Vaccine  Completed   DEXA scan (bone density measurement)  Completed   Hepatitis C Screening  Completed   Zoster (Shingles) Vaccine  Completed   HPV Vaccine  Aged Out    Advanced directives: Yes  Conditions/risks identified: Yes  Next appointment: Follow up in one year for your annual wellness visit via telephone call with Nurse Percell Miller on 04/09/2024 at 1:00 p.m.  If you need to cancel or reschedule please call [phone].   Preventive Care 73 Years and Older, Female Preventive care refers to lifestyle choices and visits with your health care provider that can promote health and wellness. What does preventive care include? A yearly physical exam. This is also called an annual well check. Dental exams once or twice a year. Routine eye exams. Ask your health care provider how often you should have your eyes checked. Personal lifestyle choices, including: Daily care of your teeth and gums. Regular physical activity. Eating a healthy diet. Avoiding tobacco and drug use. Limiting alcohol use. Practicing safe sex. Taking low-dose aspirin every day. Taking vitamin and mineral supplements as recommended by your health care provider. What happens during an annual well check? The services and  screenings done by your health care provider during your annual well check will depend on your age, overall health, lifestyle risk factors, and family history of disease. Counseling  Your health care provider may ask you questions about your: Alcohol use. Tobacco use. Drug use. Emotional well-being. Home and relationship well-being. Sexual activity. Eating habits. History of falls. Memory and ability to understand (cognition). Work and work Astronomer. Reproductive health. Screening  You may have the following tests or measurements: Height, weight, and BMI. Blood pressure. Lipid and cholesterol levels. These may be checked every 5 years, or more frequently if you are over 22 years old. Skin check. Lung cancer screening. You may have this screening every year starting at age 76 if you have a 30-pack-year history of smoking and currently smoke or have quit within the past 15 years. Fecal occult blood test (FOBT) of the stool. You may have this test every year starting at age 26. Flexible sigmoidoscopy or colonoscopy. You may have a sigmoidoscopy every 5 years or a colonoscopy every 10 years starting at age 92. Hepatitis C blood test. Hepatitis B blood test. Sexually transmitted disease (STD) testing. Diabetes screening. This is done by checking your blood sugar (glucose) after you have not eaten for a while (fasting). You may have this done every 1-3 years. Bone density scan. This is done to screen for osteoporosis. You may have this done starting at age 71. Mammogram. This may be done every 1-2 years. Talk to  your health care provider about how often you should have regular mammograms. Talk with your health care provider about your test results, treatment options, and if necessary, the need for more tests. Vaccines  Your health care provider may recommend certain vaccines, such as: Influenza vaccine. This is recommended every year. Tetanus, diphtheria, and acellular pertussis (Tdap,  Td) vaccine. You may need a Td booster every 10 years. Zoster vaccine. You may need this after age 29. Pneumococcal 13-valent conjugate (PCV13) vaccine. One dose is recommended after age 17. Pneumococcal polysaccharide (PPSV23) vaccine. One dose is recommended after age 65. Talk to your health care provider about which screenings and vaccines you need and how often you need them. This information is not intended to replace advice given to you by your health care provider. Make sure you discuss any questions you have with your health care provider. Document Released: 11/04/2015 Document Revised: 06/27/2016 Document Reviewed: 08/09/2015 Elsevier Interactive Patient Education  2017 [employer].  Fall Prevention in the Home Falls can cause injuries. They can happen to people of all ages. There are many things you can do to make your home safe and to help prevent falls. What can I do on the outside of my home? Regularly fix the edges of walkways and driveways and fix any cracks. Remove anything that might make you trip as you walk through a door, such as a raised step or threshold. Trim any bushes or trees on the path to your home. Use bright outdoor lighting. Clear any walking paths of anything that might make someone trip, such as rocks or tools. Regularly check to see if handrails are loose or broken. Make sure that both sides of any steps have handrails. Any raised decks and porches should have guardrails on the edges. Have any leaves, snow, or ice cleared regularly. Use sand or salt on walking paths during winter. Clean up any spills in your garage right away. This includes oil or grease spills. What can I do in the bathroom? Use night lights. Install grab bars by the toilet and in the tub and shower. Do not use towel bars as grab bars. Use non-skid mats or decals in the tub or shower. If you need to sit down in the shower, use a plastic, non-slip stool. Keep the floor dry. Clean up any  water that spills on the floor as soon as it happens. Remove soap buildup in the tub or shower regularly. Attach bath mats securely with double-sided non-slip rug tape. Do not have throw rugs and other things on the floor that can make you trip. What can I do in the bedroom? Use night lights. Make sure that you have a light by your bed that is easy to reach. Do not use any sheets or blankets that are too big for your bed. They should not hang down onto the floor. Have a firm chair that has side arms. You can use this for support while you get dressed. Do not have throw rugs and other things on the floor that can make you trip. What can I do in the kitchen? Clean up any spills right away. Avoid walking on wet floors. Keep items that you use a lot in easy-to-reach places. If you need to reach something above you, use a strong step stool that has a grab bar. Keep electrical cords out of the way. Do not use floor polish or wax that makes floors slippery. If you must use wax, use non-skid floor wax. Do not  have throw rugs and other things on the floor that can make you trip. What can I do with my stairs? Do not leave any items on the stairs. Make sure that there are handrails on both sides of the stairs and use them. Fix handrails that are broken or loose. Make sure that handrails are as long as the stairways. Check any carpeting to make sure that it is firmly attached to the stairs. Fix any carpet that is loose or worn. Avoid having throw rugs at the top or bottom of the stairs. If you do have throw rugs, attach them to the floor with carpet tape. Make sure that you have a light switch at the top of the stairs and the bottom of the stairs. If you do not have them, ask someone to add them for you. What else can I do to help prevent falls? Wear shoes that: Do not have high heels. Have rubber bottoms. Are comfortable and fit you well. Are closed at the toe. Do not wear sandals. If you use a  stepladder: Make sure that it is fully opened. Do not climb a closed stepladder. Make sure that both sides of the stepladder are locked into place. Ask someone to hold it for you, if possible. Clearly mark and make sure that you can see: Any grab bars or handrails. First and last steps. Where the edge of each step is. Use tools that help you move around (mobility aids) if they are needed. These include: Canes. Walkers. Scooters. Crutches. Turn on the lights when you go into a dark area. Replace any light bulbs as soon as they burn out. Set up your furniture so you have a clear path. Avoid moving your furniture around. If any of your floors are uneven, fix them. If there are any pets around you, be aware of where they are. Review your medicines with your doctor. Some medicines can make you feel dizzy. This can increase your chance of falling. Ask your doctor what other things that you can do to help prevent falls. This information is not intended to replace advice given to you by your health care provider. Make sure you discuss any questions you have with your health care provider. Document Released: 08/04/2009 Document Revised: 03/15/2016 Document Reviewed: 11/12/2014 Elsevier Interactive Patient Education  2017 [employer].

## 2023-04-09 ENCOUNTER — Ambulatory Visit: Payer: Medicare PPO | Admitting: Internal Medicine

## 2023-04-09 ENCOUNTER — Encounter: Payer: Self-pay | Admitting: Internal Medicine

## 2023-04-09 VITALS — BP 140/100 | HR 56 | Temp 98.2°F | Ht 66.0 in | Wt 159.0 lb

## 2023-04-09 DIAGNOSIS — R634 Abnormal weight loss: Secondary | ICD-10-CM

## 2023-04-09 DIAGNOSIS — R0602 Shortness of breath: Secondary | ICD-10-CM

## 2023-04-09 LAB — COMPREHENSIVE METABOLIC PANEL
ALT: 21 U/L (ref 0–35)
AST: 23 U/L (ref 0–37)
Albumin: 4.3 g/dL (ref 3.5–5.2)
Alkaline Phosphatase: 44 U/L (ref 39–117)
BUN: 14 mg/dL (ref 6–23)
CO2: 27 mEq/L (ref 19–32)
Calcium: 9.9 mg/dL (ref 8.4–10.5)
Chloride: 101 mEq/L (ref 96–112)
Creatinine, Ser: 1.12 mg/dL (ref 0.40–1.20)
GFR: 49.09 mL/min — ABNORMAL LOW (ref >60.00)
Glucose, Bld: 86 mg/dL (ref 70–99)
Potassium: 4.2 mEq/L (ref 3.5–5.1)
Sodium: 140 mEq/L (ref 135–145)
Total Bilirubin: 0.7 mg/dL (ref 0.2–1.2)
Total Protein: 7.7 g/dL (ref 6.0–8.3)

## 2023-04-09 LAB — CBC
HCT: 40.3 % (ref 36.0–46.0)
Hemoglobin: 12.7 g/dL (ref 12.0–15.0)
MCHC: 31.6 g/dL (ref 30.0–36.0)
MCV: 88.6 fl (ref 78.0–100.0)
Platelets: 214 10*3/uL (ref 150.0–400.0)
RBC: 4.55 Mil/uL (ref 3.87–5.11)
RDW: 16 % — ABNORMAL HIGH (ref 11.5–15.5)
WBC: 4.8 10*3/uL (ref 4.0–10.5)

## 2023-04-09 LAB — VITAMIN D 25 HYDROXY (VIT D DEFICIENCY, FRACTURES): VITD: 44.63 ng/mL (ref 30.00–100.00)

## 2023-04-09 LAB — TSH: TSH: 1.03 u[IU]/mL (ref 0.35–5.50)

## 2023-04-09 LAB — VITAMIN B12: Vitamin B-12: 632 pg/mL (ref 211–911)

## 2023-04-09 LAB — T4, FREE: Free T4: 1.01 ng/dL (ref 0.60–1.60)

## 2023-04-09 LAB — MAGNESIUM: Magnesium: 1.7 mg/dL (ref 1.5–2.5)

## 2023-04-09 NOTE — Progress Notes (Unsigned)
   Subjective:   Patient ID: Jessica [name], female    DOB: 03/04/1951, 72 y.o.   MRN: [id number]  Leg Pain    The patient is a 72 YO female coming in for weight loss. 172 in April and now 159. No diet or lifestyle changes. Some new rare episodes of SOB on exertion. Otherwise no new symptoms. Chronic constipation which is stable. Colonoscopy due 2025 up to date. Mammogram up to date  Review of Systems  Constitutional:  Positive for unexpected weight change.  HENT: Negative.    Eyes: Negative.   Respiratory:  Positive for shortness of breath. Negative for cough and chest tightness.   Cardiovascular:  Negative for chest pain, palpitations and leg swelling.  Gastrointestinal:  Negative for abdominal distention, abdominal pain, constipation, diarrhea, nausea and vomiting.  Musculoskeletal: Negative.   Skin: Negative.   Neurological: Negative.   Psychiatric/Behavioral: Negative.      Objective:  Physical Exam Constitutional:      Appearance: She is well-developed.  HENT:     Head: Normocephalic and atraumatic.  Cardiovascular:     Rate and Rhythm: Normal rate and regular rhythm.  Pulmonary:     Effort: Pulmonary effort is normal. No respiratory distress.     Breath sounds: Normal breath sounds. No wheezing or rales.  Abdominal:     General: Bowel sounds are normal. There is no distension.     Palpations: Abdomen is soft.     Tenderness: There is no abdominal tenderness. There is no rebound.  Musculoskeletal:     Cervical back: Normal range of motion.  Skin:    General: Skin is warm and dry.  Neurological:     Mental Status: She is alert and oriented to person, place, and time.     Coordination: Coordination normal.    Vitals:   04/09/23 1427 04/09/23 1429  BP: (!) 140/100 (!) 140/100  Pulse: (!) 56   Temp: 98.2 F (36.8 C)   TempSrc: Oral   SpO2: 98%   Weight: 159 lb (72.1 kg)   Height: 5\' 6"  (1.676 m)   EKG: Rate 58, axis normal, interval normal, sinus brady,  left atrial enlargement, anteroseptal infarct old, no st or t wave changes, no significant change compared to prior 2023   Assessment & Plan:  Visit time 20 minutes in face to face communication with patient and coordination of care, additional 10 minutes spent in record review, coordination or care, ordering tests, communicating/referring to other healthcare professionals, documenting in medical records all on the same day of the visit for total time 30 minutes spent on the visit.

## 2023-04-09 NOTE — Patient Instructions (Signed)
We will check the labs today. If those are normal we can consider doing a CT scan of the chest and stomach and pelvis.

## 2023-04-10 DIAGNOSIS — R634 Abnormal weight loss: Secondary | ICD-10-CM | POA: Insufficient documentation

## 2023-04-10 NOTE — Assessment & Plan Note (Signed)
Previously this was an anginal symptom. She has had 2 episodes recently with shortness of breath lasting a few minutes not always triggered by activity. EKG done which is stable from prior. BP elevated today but this is not usual for her. She will monitor at home. Checking TSH, B12, vitamin D CBC CMP to assess.

## 2023-04-10 NOTE — Assessment & Plan Note (Signed)
Checking TSH, free T4, B12, vitamin D, CBC, CMP to assess metabolic causes. She is down to 159 from 172 back in April 2024. She is unsure of cause no change to diet or exercise. Some new SOB on exertion. Chronic constipation not different lately. Due for colonoscopy 2025. Mammogram up to date. We can consider monitoring for a month with additional protein shake intake to see if weight stabilizes or consider CT chest abdomen and pelvis given that she has history of breast cancer and new weight loss.

## 2023-05-01 ENCOUNTER — Ambulatory Visit: Payer: Medicare PPO | Admitting: Family Medicine

## 2023-05-02 ENCOUNTER — Ambulatory Visit: Payer: Medicare PPO | Admitting: Dermatology

## 2023-05-04 ENCOUNTER — Emergency Department (HOSPITAL_COMMUNITY): Payer: Medicare PPO

## 2023-05-04 ENCOUNTER — Emergency Department (HOSPITAL_COMMUNITY)
Admission: EM | Admit: 2023-05-04 | Discharge: 2023-05-04 | Disposition: A | Discharge status: Home / Self Care | Payer: Medicare PPO | Attending: Emergency Medicine | Admitting: Emergency Medicine

## 2023-05-04 ENCOUNTER — Encounter (HOSPITAL_COMMUNITY): Payer: Self-pay

## 2023-05-04 DIAGNOSIS — S8991XA Unspecified injury of right lower leg, initial encounter: Secondary | ICD-10-CM | POA: Diagnosis present

## 2023-05-04 DIAGNOSIS — Y92410 Unspecified street and highway as the place of occurrence of the external cause: Secondary | ICD-10-CM | POA: Insufficient documentation

## 2023-05-04 DIAGNOSIS — M4182 Other forms of scoliosis, cervical region: Secondary | ICD-10-CM | POA: Diagnosis not present

## 2023-05-04 DIAGNOSIS — Z853 Personal history of malignant neoplasm of breast: Secondary | ICD-10-CM | POA: Diagnosis not present

## 2023-05-04 DIAGNOSIS — S0990XA Unspecified injury of head, initial encounter: Secondary | ICD-10-CM | POA: Diagnosis not present

## 2023-05-04 DIAGNOSIS — I700 Atherosclerosis of aorta: Secondary | ICD-10-CM | POA: Diagnosis not present

## 2023-05-04 DIAGNOSIS — G319 Degenerative disease of nervous system, unspecified: Secondary | ICD-10-CM | POA: Diagnosis not present

## 2023-05-04 DIAGNOSIS — S199XXA Unspecified injury of neck, initial encounter: Secondary | ICD-10-CM | POA: Diagnosis not present

## 2023-05-04 DIAGNOSIS — I77810 Thoracic aortic ectasia: Secondary | ICD-10-CM | POA: Diagnosis not present

## 2023-05-04 DIAGNOSIS — S80812A Abrasion, left lower leg, initial encounter: Secondary | ICD-10-CM | POA: Diagnosis not present

## 2023-05-04 DIAGNOSIS — I2510 Atherosclerotic heart disease of native coronary artery without angina pectoris: Secondary | ICD-10-CM | POA: Insufficient documentation

## 2023-05-04 DIAGNOSIS — R0789 Other chest pain: Secondary | ICD-10-CM | POA: Diagnosis not present

## 2023-05-04 DIAGNOSIS — M47812 Spondylosis without myelopathy or radiculopathy, cervical region: Secondary | ICD-10-CM | POA: Diagnosis not present

## 2023-05-04 DIAGNOSIS — S80811A Abrasion, right lower leg, initial encounter: Secondary | ICD-10-CM | POA: Insufficient documentation

## 2023-05-04 DIAGNOSIS — S299XXA Unspecified injury of thorax, initial encounter: Secondary | ICD-10-CM | POA: Insufficient documentation

## 2023-05-04 DIAGNOSIS — M47814 Spondylosis without myelopathy or radiculopathy, thoracic region: Secondary | ICD-10-CM | POA: Diagnosis not present

## 2023-05-04 DIAGNOSIS — R9389 Abnormal findings on diagnostic imaging of other specified body structures: Secondary | ICD-10-CM

## 2023-05-04 DIAGNOSIS — G238 Other specified degenerative diseases of basal ganglia: Secondary | ICD-10-CM | POA: Diagnosis not present

## 2023-05-04 LAB — I-STAT CHEM 8, ED
BUN: 10 mg/dL (ref 8–23)
BUN: 10 mg/dL (ref 8–23)
Calcium, Ion: 1.01 mmol/L — ABNORMAL LOW (ref 1.15–1.40)
Calcium, Ion: 1.03 mmol/L — ABNORMAL LOW (ref 1.15–1.40)
Chloride: 104 mmol/L (ref 98–111)
Chloride: 105 mmol/L (ref 98–111)
Creatinine, Ser: 1 mg/dL (ref 0.44–1.00)
Creatinine, Ser: 1 mg/dL (ref 0.44–1.00)
Glucose, Bld: 84 mg/dL (ref 70–99)
Glucose, Bld: 85 mg/dL (ref 70–99)
HCT: 38 % (ref 36.0–46.0)
HCT: 39 % (ref 36.0–46.0)
Hemoglobin: 12.9 g/dL (ref 12.0–15.0)
Hemoglobin: 13.3 g/dL (ref 12.0–15.0)
Potassium: 6.8 mmol/L (ref 3.5–5.1)
Potassium: 6.9 mmol/L (ref 3.5–5.1)
Sodium: 136 mmol/L (ref 135–145)
Sodium: 136 mmol/L (ref 135–145)
TCO2: 26 mmol/L (ref 22–32)
TCO2: 27 mmol/L (ref 22–32)

## 2023-05-04 LAB — BASIC METABOLIC PANEL
Anion gap: 12 (ref 5–15)
BUN: 8 mg/dL (ref 8–23)
CO2: 23 mmol/L (ref 22–32)
Calcium: 9.3 mg/dL (ref 8.9–10.3)
Chloride: 104 mmol/L (ref 98–111)
Creatinine, Ser: 0.88 mg/dL (ref 0.44–1.00)
GFR, Estimated: >60 mL/min (ref >60)
Glucose, Bld: 89 mg/dL (ref 70–99)
Potassium: 3.4 mmol/L — ABNORMAL LOW (ref 3.5–5.1)
Sodium: 139 mmol/L (ref 135–145)

## 2023-05-04 LAB — CBC WITH DIFFERENTIAL/PLATELET
Abs Immature Granulocytes: 0.02 10*3/uL (ref 0.00–0.07)
Basophils Absolute: 0 10*3/uL (ref 0.0–0.1)
Basophils Relative: 0 %
Eosinophils Absolute: 0 10*3/uL (ref 0.0–0.5)
Eosinophils Relative: 1 %
HCT: 38.4 % (ref 36.0–46.0)
Hemoglobin: 12 g/dL (ref 12.0–15.0)
Immature Granulocytes: 0 %
Lymphocytes Relative: 24 %
Lymphs Abs: 1.8 10*3/uL (ref 0.7–4.0)
MCH: 28 pg (ref 26.0–34.0)
MCHC: 31.3 g/dL (ref 30.0–36.0)
MCV: 89.7 fL (ref 80.0–100.0)
Monocytes Absolute: 0.5 10*3/uL (ref 0.1–1.0)
Monocytes Relative: 6 %
Neutro Abs: 5.1 10*3/uL (ref 1.7–7.7)
Neutrophils Relative %: 69 %
Platelets: 264 10*3/uL (ref 150–400)
RBC: 4.28 MIL/uL (ref 3.87–5.11)
RDW: 14.4 % (ref 11.5–15.5)
WBC: 7.5 10*3/uL (ref 4.0–10.5)
nRBC: 0 % (ref 0.0–0.2)

## 2023-05-04 LAB — TROPONIN I (HIGH SENSITIVITY): Troponin I (High Sensitivity): 5 ng/L (ref <18)

## 2023-05-04 MED ORDER — ONDANSETRON HCL 4 MG/2ML IJ SOLN: 4.0000 mg | Freq: Once | INTRAMUSCULAR | Status: DC

## 2023-05-04 MED ORDER — IBUPROFEN 600 MG PO TABS: 600.0000 mg | ORAL_TABLET | Freq: Four times a day (QID) | ORAL | 0 refills | Status: DC | PRN

## 2023-05-04 MED ORDER — CYCLOBENZAPRINE HCL 10 MG PO TABS: 10.0000 mg | ORAL_TABLET | Freq: Two times a day (BID) | ORAL | 0 refills | Status: DC | PRN

## 2023-05-04 MED ORDER — IOHEXOL 350 MG/ML SOLN
50.0000 mL | Freq: Once | INTRAVENOUS | Status: AC | PRN
  Administered 2023-05-04: 50 mL via INTRAVENOUS

## 2023-05-04 MED ORDER — MORPHINE SULFATE (PF) 4 MG/ML IV SOLN: 4.0000 mg | Freq: Once | INTRAVENOUS | Status: DC

## 2023-05-04 NOTE — ED Notes (Signed)
Pt transported to CT ?

## 2023-05-04 NOTE — ED Notes (Signed)
Pt d/c home per MD order. Discharge summary reviewed, pt verbalizes understanding.ambulatory off unit. No s/s of acute distress noted. Discharged home with visitor.
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
## 2023-05-04 NOTE — ED Notes (Signed)
This RN to discharge patient, pt now c/o central chest discomfort, worse with movement, this RN notified ED-PA Greta Doom
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
## 2023-05-04 NOTE — ED Notes (Signed)
Pt ambulatory without difficulty. 

## 2023-05-04 NOTE — Discharge Instructions (Addendum)
Given evaluate for your recent injury.  Fortunately no concerning injury were noted on today's exam.  Your CT scan did not show any broken bones.  Please take ibuprofen or Tylenol as needed for aches and pain.  You may use muscle relaxant at nighttime for symptom control.   Follow-up with orthopedic specialist as needed.  Return if you have any concern.  Please note that CT scan of your chest today demonstrate evidence of an ascending aortic aneurysm measuring 42 mm.  It is recommended for you to have a repeat chest CTA yearly for close monitoring.  Discussed this with your doctor.
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
## 2023-05-06 ENCOUNTER — Ambulatory Visit: Payer: Medicare PPO | Attending: Cardiovascular Disease | Admitting: Cardiovascular Disease

## 2023-05-06 ENCOUNTER — Encounter: Payer: Self-pay | Admitting: Cardiovascular Disease

## 2023-05-06 VITALS — BP 140/72 | HR 57 | Ht 66.0 in | Wt 166.0 lb

## 2023-05-06 DIAGNOSIS — R7303 Prediabetes: Secondary | ICD-10-CM

## 2023-05-06 DIAGNOSIS — E782 Mixed hyperlipidemia: Secondary | ICD-10-CM

## 2023-05-06 DIAGNOSIS — G720 Drug-induced myopathy: Secondary | ICD-10-CM | POA: Diagnosis not present

## 2023-05-06 DIAGNOSIS — T466X5A Adverse effect of antihyperlipidemic and antiarteriosclerotic drugs, initial encounter: Secondary | ICD-10-CM | POA: Diagnosis not present

## 2023-05-06 DIAGNOSIS — I10 Essential (primary) hypertension: Secondary | ICD-10-CM

## 2023-05-06 DIAGNOSIS — Z951 Presence of aortocoronary bypass graft: Secondary | ICD-10-CM | POA: Diagnosis not present

## 2023-05-06 DIAGNOSIS — I25118 Atherosclerotic heart disease of native coronary artery with other forms of angina pectoris: Secondary | ICD-10-CM | POA: Diagnosis not present

## 2023-05-06 DIAGNOSIS — E785 Hyperlipidemia, unspecified: Secondary | ICD-10-CM | POA: Diagnosis not present

## 2023-05-06 DIAGNOSIS — M65341 Trigger finger, right ring finger: Secondary | ICD-10-CM | POA: Diagnosis not present

## 2023-05-06 MED ORDER — REPATHA SURECLICK 140 MG/ML ~~LOC~~ SOAJ: 140.0000 mg | SUBCUTANEOUS | 3 refills | Status: DC

## 2023-05-06 MED ORDER — ASPIRIN 81 MG PO TBEC: 81.0000 mg | DELAYED_RELEASE_TABLET | Freq: Every day | ORAL | Status: AC

## 2023-05-06 NOTE — Patient Instructions (Addendum)
Medication Instructions:  Repatha 140 sq every two weeks Asa 81  EC mg daily  If you need a refill on your cardiac medications before your next appointment, please call your pharmacy.   Lab work: No new labs needed  Testing/Procedures: No new testing needed  Follow-Up: At [facility], you and your health needs are our priority.  As part of our continuing mission to provide you with exceptional heart care, we have created designated Provider Care Teams.  These Care Teams include your primary Cardiologist (physician) and Advanced Practice Providers (APPs -  Physician Assistants and Nurse Practitioners) who all work together to provide you with the care you need, when you need it.  You will need a follow up appointment in 12 months  Providers on your designated Care Team:   Nicolasa Ducking, NP Eula Listen, PA-C Cadence Fransico Michael, [place]  COVID-19 Vaccine Information can be found at: [email or web address] For questions related to vaccine distribution or appointments, please email [email or web address] or call [phone].

## 2023-05-07 ENCOUNTER — Telehealth: Payer: Self-pay

## 2023-05-07 ENCOUNTER — Other Ambulatory Visit (HOSPITAL_COMMUNITY): Payer: Self-pay

## 2023-05-07 ENCOUNTER — Encounter: Payer: Self-pay | Admitting: Cardiovascular Disease

## 2023-05-07 ENCOUNTER — Telehealth: Payer: Self-pay | Admitting: Cardiovascular Disease

## 2023-05-07 NOTE — Telephone Encounter (Signed)
Pharmacy Patient Advocate Encounter  Received notification from [facility] that Prior Authorization for REPATHA has been APPROVED from 10/22/22 to 10/22/23.[name]  PA #/Case ID/Reference #: [id number]

## 2023-05-07 NOTE — Telephone Encounter (Signed)
Pharmacy Patient Advocate Encounter   Received notification from Pt Calls Messages that prior authorization for REPATHA is required/requested.   Insurance verification completed.   The patient is insured through [employer] .   Per test claim: PA submitted to [facility] via CoverMyMeds Key/confirmation #/EOC [id number] Status is pending

## 2023-05-07 NOTE — Telephone Encounter (Signed)
Spoke with pharmacy staff and informed them of the following:  Pharmacy Patient Advocate Encounter   Received notification from [facility] that Prior Authorization for REPATHA has been APPROVED from 10/22/22 to 10/22/23.[name]  PA #/Case ID/Reference #: [id number]  Staff stated that they would order medications for the patient to be able to pick up tomorrow for $80 for 90 day supply

## 2023-05-07 NOTE — Telephone Encounter (Signed)
PA has been submitted, and telephone encounter has been created. I will route you back in once I have a determination from insurance.

## 2023-05-07 NOTE — Telephone Encounter (Signed)
Error

## 2023-05-07 NOTE — Telephone Encounter (Signed)
Pt c/o medication issue:  1. Name of Medication: Evolocumab (REPATHA SURECLICK) 140 MG/ML SOAJ   2. How are you currently taking this medication (dosage and times per day)?   3. Are you having a reaction (difficulty breathing--STAT)?   4. What is your medication issue? [facility] calling to make sure PA request was received.   Phone # [phone] for PA

## 2023-05-08 ENCOUNTER — Telehealth: Payer: Self-pay

## 2023-05-08 NOTE — Telephone Encounter (Signed)
Transition Care Management Unsuccessful Follow-up Telephone Call  Date of discharge and from where:  05/04/2023 [facility]  Attempts:  1st Attempt  Reason for unsuccessful TCM follow-up call:  Left voice message  Maybelline Kolarik [facility]  [facility] Population Health Community Resource Care Guide   [email or web address]  ?? [phone]   Website: [email or web address]  [email or web address]

## 2023-05-10 ENCOUNTER — Ambulatory Visit: Payer: Medicare PPO | Admitting: Podiatry

## 2023-05-10 ENCOUNTER — Telehealth: Payer: Self-pay

## 2023-05-10 NOTE — Telephone Encounter (Signed)
Transition Care Management Follow-up Telephone Call Date of discharge and from where: 05/04/2023 [facility] How have you been since you were released from the hospital? Patient is feeling better, but her ribs and right leg are still sore. Any questions or concerns? No  Items Reviewed: Did the pt receive and understand the discharge instructions provided? Yes  Medications obtained and verified? Yes  Other? No  Any new allergies since your discharge? No  Dietary orders reviewed? Yes Do you have support at home? Yes   Follow up appointments reviewed:  PCP Hospital f/u appt confirmed? Yes  Scheduled to see Julien Nordmann, MD on 05/06/2023 @ [facility] [place]. Specialist Hospital f/u appt confirmed? No  Scheduled to see  on  @ . Are transportation arrangements needed? No  If their condition worsens, is the pt aware to call PCP or go to the Emergency Dept.? Yes Was the patient provided with contact information for the PCP's office or ED? Yes Was to pt encouraged to call back with questions or concerns? Yes  [name]  [facility] Population Health Community Resource Care Guide   ??Jessica [name] Jessica [name]  ?? [phone]   Website: [email or web address]  [email or web address]

## 2023-05-13 ENCOUNTER — Encounter: Payer: Self-pay | Admitting: Internal Medicine

## 2023-05-14 ENCOUNTER — Ambulatory Visit: Payer: Medicare PPO | Admitting: Internal Medicine

## 2023-05-14 ENCOUNTER — Ambulatory Visit: Payer: Medicare PPO | Admitting: Family Medicine

## 2023-05-14 ENCOUNTER — Other Ambulatory Visit: Payer: Self-pay

## 2023-05-14 ENCOUNTER — Ambulatory Visit (INDEPENDENT_AMBULATORY_CARE_PROVIDER_SITE_OTHER): Payer: Medicare PPO

## 2023-05-14 ENCOUNTER — Encounter: Payer: Self-pay | Admitting: Family Medicine

## 2023-05-14 VITALS — BP 136/88 | HR 60 | Ht 66.0 in | Wt 168.0 lb

## 2023-05-14 DIAGNOSIS — M79642 Pain in left hand: Secondary | ICD-10-CM

## 2023-05-14 DIAGNOSIS — S6991XA Unspecified injury of right wrist, hand and finger(s), initial encounter: Secondary | ICD-10-CM | POA: Diagnosis not present

## 2023-05-14 DIAGNOSIS — R0789 Other chest pain: Secondary | ICD-10-CM | POA: Diagnosis not present

## 2023-05-14 DIAGNOSIS — M79641 Pain in right hand: Secondary | ICD-10-CM | POA: Diagnosis not present

## 2023-05-14 DIAGNOSIS — M1812 Unilateral primary osteoarthritis of first carpometacarpal joint, left hand: Secondary | ICD-10-CM | POA: Diagnosis not present

## 2023-05-14 DIAGNOSIS — M1811 Unilateral primary osteoarthritis of first carpometacarpal joint, right hand: Secondary | ICD-10-CM | POA: Diagnosis not present

## 2023-05-14 NOTE — Patient Instructions (Addendum)
Thank you for coming in today.   Please get an Xray today before you leave   Please use Voltaren gel (Generic Diclofenac Gel) up to 4x daily for pain as needed.  This is available over-the-counter as both the name brand Voltaren gel and the generic diclofenac gel.   I've referred you to Physical Therapy.  Let us know if you don't hear from them in one week.   Recheck in 2-4 weeks especially if worsening or not better.  Otherwise recheck after PT.
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
## 2023-05-15 ENCOUNTER — Encounter: Payer: Self-pay | Admitting: Internal Medicine

## 2023-05-16 ENCOUNTER — Other Ambulatory Visit: Payer: Self-pay

## 2023-05-16 ENCOUNTER — Ambulatory Visit: Payer: Medicare PPO | Admitting: Family Medicine

## 2023-05-16 VITALS — BP 136/88 | HR 60 | Ht 66.0 in | Wt 168.0 lb

## 2023-05-16 DIAGNOSIS — M65341 Trigger finger, right ring finger: Secondary | ICD-10-CM

## 2023-05-16 DIAGNOSIS — M79642 Pain in left hand: Secondary | ICD-10-CM | POA: Diagnosis not present
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
## 2023-05-16 NOTE — Patient Instructions (Signed)
Thank you for coming in today.   You received an injection today. Seek immediate medical attention if the joint becomes red, extremely painful, or is oozing fluid.  

## 2023-05-20 NOTE — Progress Notes (Signed)
Left hand x-ray:  Severe arthritis is present at the base of the thumb.  Medium arthritis is present at the middle finger.  No fractures are present.

## 2023-05-20 NOTE — Progress Notes (Signed)
Right hand x-ray: Medium arthritis is present.  No fractures are present.

## 2023-05-28 ENCOUNTER — Ambulatory Visit: Payer: Medicare PPO | Admitting: Family Medicine

## 2023-05-29 ENCOUNTER — Ambulatory Visit: Payer: Medicare PPO | Admitting: Family Medicine

## 2023-05-29 ENCOUNTER — Other Ambulatory Visit: Payer: Self-pay

## 2023-05-29 ENCOUNTER — Encounter: Payer: Self-pay | Admitting: Family Medicine

## 2023-05-29 VITALS — BP 126/82 | HR 65 | Ht 66.0 in | Wt 165.0 lb

## 2023-05-29 DIAGNOSIS — M25562 Pain in left knee: Secondary | ICD-10-CM | POA: Diagnosis not present

## 2023-05-29 DIAGNOSIS — M6530 Trigger finger, unspecified finger: Secondary | ICD-10-CM

## 2023-05-29 DIAGNOSIS — G8929 Other chronic pain: Secondary | ICD-10-CM | POA: Diagnosis not present

## 2023-05-29 DIAGNOSIS — M65342 Trigger finger, left ring finger: Secondary | ICD-10-CM | POA: Diagnosis not present

## 2023-05-29 DIAGNOSIS — M25561 Pain in right knee: Secondary | ICD-10-CM

## 2023-05-29 NOTE — Patient Instructions (Addendum)
Thank you for coming in today.   You received an injection today. Seek immediate medical attention if the joint becomes red, extremely painful, or is oozing fluid.  
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
## 2023-06-20 ENCOUNTER — Other Ambulatory Visit: Payer: Self-pay

## 2023-06-20 ENCOUNTER — Ambulatory Visit: Payer: Medicare PPO | Admitting: Family Medicine

## 2023-06-20 VITALS — BP 130/82 | HR 54 | Ht 66.0 in | Wt 166.0 lb

## 2023-06-20 DIAGNOSIS — M79602 Pain in left arm: Secondary | ICD-10-CM | POA: Diagnosis not present

## 2023-06-20 DIAGNOSIS — M25511 Pain in right shoulder: Secondary | ICD-10-CM

## 2023-06-20 DIAGNOSIS — G8929 Other chronic pain: Secondary | ICD-10-CM

## 2023-06-20 DIAGNOSIS — M25512 Pain in left shoulder: Secondary | ICD-10-CM | POA: Diagnosis not present

## 2023-06-20 DIAGNOSIS — M79601 Pain in right arm: Secondary | ICD-10-CM | POA: Diagnosis not present

## 2023-06-20 LAB — CK: Total CK: 99 U/L (ref 7–177)

## 2023-06-20 LAB — SEDIMENTATION RATE: Sed Rate: 66 mm/hr — ABNORMAL HIGH (ref 0–30)

## 2023-06-20 NOTE — Progress Notes (Signed)
Rubin Payor, PhD, LAT, ATC acting as a scribe for Jessica Graham, MD.  Jessica [name] is a 72 y.o. female who presents to [facility] at [facility] today for upper arm pain. Pt was previously seen by Dr. Denyse Amass on 05/29/23 for bilat hand pain.  Today, pt c/o bilat upper arm pain ongoing since Sunday. No injury. She is wondering if the pain is related to her Repatha. Pt locates pain to middle of both upper arms.   When we talked about her pain she notes that she did do quite a bit of gardening and increased activity about 2 days prior to the onset of pain.  She cannot recall any injury but that is the only change in activity that she can think of.  Treatments tried: prednisone, IBU, skipped a Repatha injection  Pertinent review of systems: No fevers or chills  Relevant historical information: Rheumatoid arthritis.  CAD and hyperlipidemia on Repatha.   Exam:  BP 130/82 (BP Location: Right Wrist)   Pulse (!) 54   Ht 5\' 6"  (1.676 m)   Wt 166 lb (75.3 kg)   SpO2 98%   BMI 26.79 kg/m  General: Well Developed, well nourished, and in no acute distress.   MSK: Shoulders bilaterally normal. Decreased abduction range of motion limited to about 100 degrees.  External and internal rotation are intact. Strength abduction 4/5 with pain.  External rotation 5/5 internal rotation 4/5 with pain. Positive empty can test. Positive Hawkins and Neer's test.    Lab and Radiology Results  Diagnostic Limited MSK Ultrasound of: Bilateral shoulders significant for mild subacromial bursitis and subdeltoid bursitis and biceps tenosynovitis.  No definitive rotator cuff tear is visible. Impression: Subdeltoid and subacromial bursitis and biceps tendinitis.      Assessment and Plan: 72 y.o. female with bilateral upper arm pain.  Etiology is somewhat clear.  She did increase her activity level and a few days later experience pain.  It is possible this is just overuse irritation  or rotator cuff tendinitis/bursitis.  I think that is the most likely explanation.  Polymyalgia rheumatica is a possibility but is less likely.  We are going to go ahead and check sed rate and CK today.  She has had steroid somewhat recently so these labs may be falsely negative.  We talked about potentially doing a steroid injection in the shoulders bilaterally.  She wants to wait a little bit which I think is just fine.  She will keep me updated with how she feels.   PDMP not reviewed this encounter. Orders Placed This Encounter  Procedures   [place] LIMITED JOINT SPACE STRUCTURES UP BILAT(NO LINKED CHARGES)    Order Specific Question:   Reason for Exam (SYMPTOM  OR DIAGNOSIS REQUIRED)    Answer:   bilateral upper arm pain    Order Specific Question:   Preferred imaging location?    Answer:   [facility]   Sedimentation rate    Standing Status:   Future    Number of Occurrences:   1    Standing Expiration Date:   06/19/2024   CK    Standing Status:   Future    Number of Occurrences:   1    Standing Expiration Date:   06/19/2024   No orders of the defined types were placed in this encounter.    Discussed warning signs or symptoms. Please see discharge instructions. Patient expresses understanding.   The above documentation has been reviewed  and is accurate and complete Jessica [name], M.D.

## 2023-06-20 NOTE — Patient Instructions (Addendum)
Thank you for coming in today.   Please get labs today before you leave

## 2023-06-25 NOTE — Progress Notes (Signed)
Sedimentation rate is a bit elevated.  This could be polymyalgia rheumatica. Are you seeing your rheumatologist?  It may be a good idea to get back with your rheumatologist.

## 2023-06-26 ENCOUNTER — Ambulatory Visit: Payer: Medicare PPO | Admitting: Family Medicine

## 2023-06-26 ENCOUNTER — Encounter: Payer: Self-pay | Admitting: Family Medicine

## 2023-06-26 ENCOUNTER — Other Ambulatory Visit: Payer: Self-pay

## 2023-06-26 VITALS — BP 122/82 | HR 64 | Ht 66.0 in | Wt 163.0 lb

## 2023-06-26 DIAGNOSIS — M25512 Pain in left shoulder: Secondary | ICD-10-CM | POA: Diagnosis not present

## 2023-06-26 DIAGNOSIS — G8929 Other chronic pain: Secondary | ICD-10-CM | POA: Diagnosis not present

## 2023-06-26 DIAGNOSIS — M25511 Pain in right shoulder: Secondary | ICD-10-CM

## 2023-06-26 NOTE — Patient Instructions (Addendum)
Thank you for coming in today.   You received an injection today. Seek immediate medical attention if the joint becomes red, extremely painful, or is oozing fluid.   Let me know how this goes. 

## 2023-06-26 NOTE — Progress Notes (Unsigned)
I, Stevenson Clinch, CMA acting as a scribe for [name], MD.  Jessica [name] is a 72 y.o. female who presents to [facility] at [facility] today for cont'd bilat upper arm pain. Pt was last seen by Dr. Denyse Amass on 06/20/23 and labs were obtained.   Today, pt reports continued upper arm pain. Plans to scheduled with Rheumatology, Dr. Kathi Ludwig. Notes being pain free on Sunday, then sx returned on Monday. Has concerns about taking IBU too often. Notes more pain in the neck and traps today. Would like to discuss pain management options to help until she is able to get in with rheumatology.   Dx testing: 06/20/23 Labs  Pertinent review of systems: No fevers or chills  Relevant historical information: Rheumatoid arthritis   Exam:  BP 122/82   Pulse 64   Ht 5\' 6"  (1.676 m)   Wt 163 lb (73.9 kg)   SpO2 96%   BMI 26.31 kg/m  General: Well Developed, well nourished, and in no acute distress.   MSK: Shoulders bilaterally decreased range of motion and pain with abduction.    Lab and Radiology Results  Procedure: Real-time Ultrasound Guided Injection of left shoulder glenohumeral joint posterior approach Device: Philips Affiniti 50G/GE Logiq Images permanently stored and available for review in PACS Verbal informed consent obtained.  Discussed risks and benefits of procedure. Warned about infection, bleeding, hyperglycemia damage to structures among others. Patient expresses understanding and agreement Time-out conducted.   Noted no overlying erythema, induration, or other signs of local infection.   Skin prepped in a sterile fashion.   Local anesthesia: Topical Ethyl chloride.   With sterile technique and under real time ultrasound guidance: 40 mg of Kenalog and 2 ml of Marcaine injected into glenohumeral joint. Fluid seen entering the joint capsule.   Completed without difficulty   Pain immediately resolved suggesting accurate placement of the medication.    Advised to call if fevers/chills, erythema, induration, drainage, or persistent bleeding.   Images permanently stored and available for review in the ultrasound unit.  Impression: Technically successful ultrasound guided injection.    Procedure: Real-time Ultrasound Guided Injection of right shoulder glenohumeral joint posterior approach Device: Philips Affiniti 50G/GE Logiq Images permanently stored and available for review in PACS Verbal informed consent obtained.  Discussed risks and benefits of procedure. Warned about infection, bleeding, hyperglycemia damage to structures among others. Patient expresses understanding and agreement Time-out conducted.   Noted no overlying erythema, induration, or other signs of local infection.   Skin prepped in a sterile fashion.   Local anesthesia: Topical Ethyl chloride.   With sterile technique and under real time ultrasound guidance: 40 mg of Kenalog and 2 mL of Marcaine injected into glenohumeral joint. Fluid seen entering the joint capsule.   Completed without difficulty   Pain immediately resolved suggesting accurate placement of the medication.   Advised to call if fevers/chills, erythema, induration, drainage, or persistent bleeding.   Images permanently stored and available for review in the ultrasound unit.  Impression: Technically successful ultrasound guided injection.    Lab Results  Component Value Date   ESRSEDRATE 59 (H) 06/20/2023       Assessment and Plan: 72 y.o. female with bilateral shoulder pain thought to be potentially flareup of rheumatoid arthritis versus tendinitis or other internal shoulder derangement.  Plan for glenohumeral injections.  If this does not work consider course of oral steroids.  Follow-up with rheumatology.  Check back as needed.   PDMP not reviewed  this encounter. Orders Placed This Encounter  Procedures   [place] LIMITED JOINT SPACE STRUCTURES UP BILAT(NO LINKED CHARGES)    Order Specific  Question:   Reason for Exam (SYMPTOM  OR DIAGNOSIS REQUIRED)    Answer:   bilat shoulder/arm pain    Order Specific Question:   Preferred imaging location?    Answer:   [facility]   No orders of the defined types were placed in this encounter.    Discussed warning signs or symptoms. Please see discharge instructions. Patient expresses understanding.   The above documentation has been reviewed and is accurate and complete [name], M.D.

## 2023-07-19 ENCOUNTER — Ambulatory Visit: Payer: Medicare PPO

## 2023-07-19 ENCOUNTER — Ambulatory Visit (INDEPENDENT_AMBULATORY_CARE_PROVIDER_SITE_OTHER)
Admission: RE | Admit: 2023-07-19 | Discharge: 2023-07-19 | Disposition: A | Discharge status: Home / Self Care | Payer: Medicare PPO | Source: Ambulatory Visit | Attending: Internal Medicine | Admitting: Internal Medicine

## 2023-07-19 DIAGNOSIS — Z13820 Encounter for screening for osteoporosis: Secondary | ICD-10-CM

## 2023-07-19 DIAGNOSIS — Z23 Encounter for immunization: Secondary | ICD-10-CM | POA: Diagnosis not present

## 2023-07-19 NOTE — Progress Notes (Signed)
Patient presented in the office today for her HD Flu vaccine. HD Flu Vaccine was administered into her Right Deltoid Muscle. Patient tolerated the injection well and her injection site looked fine. Patient advised to report to the office immediately if she noticed any adverse reactions.

## 2023-07-20 DIAGNOSIS — M65321 Trigger finger, right index finger: Secondary | ICD-10-CM | POA: Diagnosis not present

## 2023-07-22 ENCOUNTER — Encounter: Payer: Self-pay | Admitting: Cardiovascular Disease

## 2023-07-22 LAB — HM DEXA SCAN: HM Dexa Scan: -0.8

## 2023-07-30 ENCOUNTER — Encounter: Payer: Self-pay | Admitting: Family Medicine

## 2023-07-30 ENCOUNTER — Other Ambulatory Visit: Payer: Self-pay

## 2023-07-30 ENCOUNTER — Ambulatory Visit: Payer: Medicare PPO | Admitting: Family Medicine

## 2023-07-30 VITALS — BP 122/82 | HR 72 | Temp 98.4°F | Ht 66.0 in | Wt 170.0 lb

## 2023-07-30 DIAGNOSIS — M542 Cervicalgia: Secondary | ICD-10-CM | POA: Diagnosis not present

## 2023-07-30 DIAGNOSIS — M79642 Pain in left hand: Secondary | ICD-10-CM

## 2023-07-30 DIAGNOSIS — M79641 Pain in right hand: Secondary | ICD-10-CM

## 2023-07-30 DIAGNOSIS — M069 Rheumatoid arthritis, unspecified: Secondary | ICD-10-CM

## 2023-07-30 NOTE — Progress Notes (Signed)
I, Stevenson Clinch, CMA acting as a scribe for [name], MD.  Jessica [name] is a 72 y.o. female who presents to [facility] at [facility] today for cont'd bilat hand pain. Her last visit w/ Dr. Denyse Amass for her hands was on 05/29/23 and she was given a L 4th trigger finger steroid injection. Last R 4th trigger finger injection was on 05/16/23.  Today, pt reports worsening swelling in the right 2nd finger since the end of Sept. Pt locates pain to the entire finger. Swelling did improve s/p injection but continues to have pain and swelling. Also notes pain and swelling in the left 1st finger. Notes increased warmth around the thumb. Also notes neck pain, worse when looking towards the right, pain radiating into the base of the skull. Has tried IBU with minimal relief. Has tried alternating heat and ice.   She has an appointment with a rheumatologist scheduled for next week.  Additionally she notes right lateral neck pain.  She had an injury and had a CT scan of her C-spine July 2024.  This showed some degenerative changes but no fractures.  Dx testing: 06/20/23 Labs  05/14/23 R & L hand XR   Pertinent review of systems: No fevers or chills  Relevant historical information: Rheumatoid arthritis   Exam:  BP 122/82   Pulse 72   Temp 98.4 F (36.9 C)   Ht 5\' 6"  (1.676 m)   Wt 170 lb (77.1 kg)   SpO2 98%   BMI 27.44 kg/m  General: Well Developed, well nourished, and in no acute distress.   MSK: Hands and wrist bilaterally significant wrist effusion occurring bilaterally.  Some MCP synovitis present right second MCP.  Decreased hand and wrist motion.    Lab and Radiology Results  Procedure: Real-time Ultrasound Guided Injection of right dorsal wrist Device: Philips Affiniti 50G/GE Logiq Images permanently stored and available for review in PACS Verbal informed consent obtained.  Discussed risks and benefits of procedure. Warned about infection, bleeding,  hyperglycemia damage to structures among others. Patient expresses understanding and agreement Time-out conducted.   Noted no overlying erythema, induration, or other signs of local infection.   Skin prepped in a sterile fashion.   Local anesthesia: Topical Ethyl chloride.   With sterile technique and under real time ultrasound guidance: 40 mg of Kenalog and 1 mL of lidocaine injected into wrist joint. Fluid seen entering the joint capsule.   Completed without difficulty   Pain immediately resolved suggesting accurate placement of the medication.   Advised to call if fevers/chills, erythema, induration, drainage, or persistent bleeding.   Images permanently stored and available for review in the ultrasound unit.  Impression: Technically successful ultrasound guided injection.    Procedure: Real-time Ultrasound Guided Injection of left dorsal wrist joint Device: Philips Affiniti 50G/GE Logiq Images permanently stored and available for review in PACS Ultrasound evaluation prior to injection shows significant wrist effusion and tenosynovitis dorsal wrist. Verbal informed consent obtained.  Discussed risks and benefits of procedure. Warned about infection, bleeding, hyperglycemia damage to structures among others. Patient expresses understanding and agreement Time-out conducted.   Noted no overlying erythema, induration, or other signs of local infection.   Skin prepped in a sterile fashion.   Local anesthesia: Topical Ethyl chloride.   With sterile technique and under real time ultrasound guidance: 40 mg of Kenalog and 1 mL of lidocaine injected into the wrist joint. Fluid seen entering the joint capsule.   Completed without difficulty  Pain immediately resolved suggesting accurate placement of the medication.   Advised to call if fevers/chills, erythema, induration, drainage, or persistent bleeding.   Images permanently stored and available for review in the ultrasound unit.  Impression:  Technically successful ultrasound guided injection.        Assessment and Plan: 72 y.o. female with bilateral wrist and hand pain and swelling.  This occurs without injury and is thought to be a rheumatoid arthritis flareup.  She has an appointment with her rheumatologist next week which I think will be helpful.  For now for temporary pain control I will do steroid injections bilateral wrists which I think will be quite helpful.  Additionally she notes right lateral neck pain.  I think this is unlikely to be related to rheumatoid arthritis is more likely muscle spasm and dysfunction.  She is a good candidate for physical therapy.  Plan refer to PT.  She lives in [place] so we will use a location there.   PDMP not reviewed this encounter. Orders Placed This Encounter  Procedures   [place] LIMITED JOINT SPACE STRUCTURES UP BILAT(NO LINKED CHARGES)    Order Specific Question:   Reason for Exam (SYMPTOM  OR DIAGNOSIS REQUIRED)    Answer:   bilateral hand pain    Order Specific Question:   Preferred imaging location?    Answer:   [facility] referral to Physical Therapy    Referral Priority:   Routine    Referral Type:   Physical Medicine    Referral Reason:   Specialty Services Required    Requested Specialty:   Physical Therapy    Number of Visits Requested:   1   No orders of the defined types were placed in this encounter.    Discussed warning signs or symptoms. Please see discharge instructions. Patient expresses understanding.   The above documentation has been reviewed and is accurate and complete [name], M.D.

## 2023-07-30 NOTE — Patient Instructions (Signed)
Thank you for coming in today.   Call or go to the ER if you develop a large red swollen joint with extreme pain or oozing puss.    I've referred you to Physical Therapy.  Let us know if you don't hear from them in one week.   Let me know how it goes next week with Rheumatology

## 2023-08-01 ENCOUNTER — Ambulatory Visit: Payer: Medicare PPO | Attending: Family Medicine | Admitting: Physical Therapy

## 2023-08-01 DIAGNOSIS — M542 Cervicalgia: Secondary | ICD-10-CM | POA: Diagnosis not present
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
## 2023-08-05 ENCOUNTER — Ambulatory Visit: Payer: Medicare PPO | Admitting: Physical Therapy

## 2023-08-05 ENCOUNTER — Ambulatory Visit: Payer: Medicare PPO | Admitting: Internal Medicine

## 2023-08-07 ENCOUNTER — Encounter: Payer: Medicare PPO | Admitting: Physical Therapy

## 2023-08-12 ENCOUNTER — Encounter: Payer: Medicare PPO | Admitting: Physical Therapy

## 2023-08-14 ENCOUNTER — Encounter: Payer: Medicare PPO | Admitting: Physical Therapy

## 2023-08-15 ENCOUNTER — Ambulatory Visit: Payer: Medicare PPO | Admitting: Family Medicine

## 2023-08-15 VITALS — BP 138/80 | HR 63 | Wt 159.0 lb

## 2023-08-15 DIAGNOSIS — M069 Rheumatoid arthritis, unspecified: Secondary | ICD-10-CM

## 2023-08-15 DIAGNOSIS — M79644 Pain in right finger(s): Secondary | ICD-10-CM

## 2023-08-15 DIAGNOSIS — F5101 Primary insomnia: Secondary | ICD-10-CM

## 2023-08-15 MED ORDER — TRAZODONE HCL 50 MG PO TABS
50.0000 mg | ORAL_TABLET | Freq: Every day | ORAL | 2 refills | Status: DC
Start: 2023-08-15 — End: 2023-08-19

## 2023-08-15 NOTE — Progress Notes (Signed)
   I, Stevenson Clinch, CMA acting as a scribe for [name], MD.  Jessica [name] is a 72 y.o. female who presents to [facility] today for cont'd hand pain. Pt was last seen by Dr. Denyse Amass on 07/30/23 and was given bilat dorsal wrist steroid injections.  Today, pt reports continued finger pain. RHEUM visit got canceled, she did not r/s at that time. Appt with Dr. Kathi Ludwig was also canceled by the office, r/s for 08/27/23. Swelling at the base of the finger has improved but has worsening swelling in the finger. Sx causing night disturbance, only sleeping about 2 hours per night. Plans to discuss referral for sleep study with PCP at visit on Monday.   For her rheumatoid arthritis she is had trials of at least methotrexate and I think also hydroxychloroquine.  She did not tolerate them.  One of her rheumatologist has recommended Enbrel.  I am not sure if she took it or not but currently is not taking any medications for rheumatoid arthritis.  Her current rheumatologist recommended prednisone which she has not started taking yet.  Dx testing: 06/20/23 Labs  05/14/23 R & L hand XR   Pertinent review of systems: No fevers or chills  Relevant historical information: Toyed arthritis   Exam:  BP 138/80   Pulse 63   Wt 159 lb (72.1 kg)   SpO2 99%   BMI 25.66 kg/m  General: Well Developed, well nourished, and in no acute distress.   MSK: Right hand synovitis present at MCP and PIP.  Tender palpation.   Assessment and Plan: 72 y.o. female with rheumatoid arthritis affecting multiple sites.  This is currently uncontrolled because she has not taken any medication for it.  She has done well intermittently with steroids.  However this is not a good long-term strategy.  Recommend that she take the prednisone that was recommended by her rheumatologist until she has her appointment on November 5.  We talked about long-term planning for rheumatology.  She is already been  tried on oral disease modifying Intermedics could not tolerate them.  The next step would be a biologic like Enbrel.  I think this is a good idea for her.  As for insomnia that probably is contributing to her overall discomfort and pain.  She has tried multiple different natural options including melatonin and is having trouble falling asleep and staying asleep.  Will try trazodone.  She has an appointment with PCP on Monday.  How she does with trazodone over the next few days could be helpful for Dr. Okey Dupre later.   PDMP not reviewed this encounter. No orders of the defined types were placed in this encounter.  Meds ordered this encounter  Medications   traZODone (DESYREL) 50 MG tablet    Sig: Take 1 tablet (50 mg total) by mouth at bedtime.    Dispense:  60 tablet    Refill:  2     Discussed warning signs or symptoms. Please see discharge instructions. Patient expresses understanding.   The above documentation has been reviewed and is accurate and complete [name], M.D. Total encounter time 30 minutes including face-to-face time with the patient and, reviewing past medical record, and charting on the date of service.

## 2023-08-15 NOTE — Patient Instructions (Signed)
Thank you for coming in today.   I've sent a prescription for Trazodone to your pharmacy.   Take the prednisone that you already have  Follow up with rheumatology

## 2023-08-19 ENCOUNTER — Ambulatory Visit: Payer: Medicare PPO | Admitting: Internal Medicine

## 2023-08-19 ENCOUNTER — Encounter: Payer: Self-pay | Admitting: Internal Medicine

## 2023-08-19 VITALS — BP 144/80 | HR 59 | Temp 98.3°F | Ht 66.0 in | Wt 158.0 lb

## 2023-08-19 DIAGNOSIS — E782 Mixed hyperlipidemia: Secondary | ICD-10-CM

## 2023-08-19 DIAGNOSIS — I10 Essential (primary) hypertension: Secondary | ICD-10-CM | POA: Diagnosis not present

## 2023-08-19 DIAGNOSIS — M069 Rheumatoid arthritis, unspecified: Secondary | ICD-10-CM

## 2023-08-19 DIAGNOSIS — R7303 Prediabetes: Secondary | ICD-10-CM

## 2023-08-19 DIAGNOSIS — R634 Abnormal weight loss: Secondary | ICD-10-CM

## 2023-08-19 LAB — VITAMIN B12: Vitamin B-12: >1537 pg/mL — ABNORMAL HIGH (ref 211–911)

## 2023-08-19 LAB — C-REACTIVE PROTEIN: CRP: 1.8 mg/dL (ref 0.5–20.0)

## 2023-08-19 LAB — LIPID PANEL
Cholesterol: 231 mg/dL — ABNORMAL HIGH (ref 0–200)
HDL: 51.4 mg/dL (ref >39.00)
LDL Cholesterol: 161 mg/dL — ABNORMAL HIGH (ref 0–99)
NonHDL: 179.11
Total CHOL/HDL Ratio: 4
Triglycerides: 89 mg/dL (ref 0.0–149.0)
VLDL: 17.8 mg/dL (ref 0.0–40.0)

## 2023-08-19 LAB — SEDIMENTATION RATE: Sed Rate: 79 mm/h — ABNORMAL HIGH (ref 0–30)

## 2023-08-19 LAB — HEMOGLOBIN A1C: Hgb A1c MFr Bld: 6.1 % (ref 4.6–6.5)

## 2023-08-19 LAB — MAGNESIUM: Magnesium: 1.7 mg/dL (ref 1.5–2.5)

## 2023-08-19 NOTE — Progress Notes (Unsigned)
Subjective:   Patient ID: Jessica [name], female    DOB: 1951-02-04, 72 y.o.   MRN: [id number]  HPI The patient is a 72 YO female coming in for insomnia and weight loss.  Review of Systems  Objective:  Physical Exam  Vitals:   08/19/23 1416 08/19/23 1424  BP: (!) 144/80 (!) 144/80  Pulse: (!) 59   Temp: 98.3 F (36.8 C)   TempSrc: Oral   SpO2: 98%   Weight: 158 lb (71.7 kg) 158 lb (71.7 kg)  Height: 5\' 6"  (1.676 m)     Assessment & Plan:

## 2023-08-19 NOTE — Patient Instructions (Addendum)
Melatonin is safe to try over the counter for sleep.  We will check the labs today.

## 2023-08-20 NOTE — Assessment & Plan Note (Signed)
Checking lipid panel and adjust as needed. She is taking repatha and tolerating well.

## 2023-08-20 NOTE — Assessment & Plan Note (Signed)
Checking HgA1c since due.

## 2023-08-20 NOTE — Assessment & Plan Note (Addendum)
BP is borderline today but normal at home will continue off meds. Low threshold to resume medication for BP.

## 2023-08-20 NOTE — Assessment & Plan Note (Signed)
Checking ESR, CRP, CMP, CBC, magnesium due to cramps and worsening symptoms to see if she is having a flare.

## 2023-08-20 NOTE — Assessment & Plan Note (Signed)
Weight is stable since last visit but down overall since last year. Due for colonoscopy next year and up to date on mammogram.

## 2023-08-22 ENCOUNTER — Encounter: Payer: Medicare PPO | Admitting: Physical Therapy

## 2023-08-27 DIAGNOSIS — M25579 Pain in unspecified ankle and joints of unspecified foot: Secondary | ICD-10-CM | POA: Diagnosis not present

## 2023-08-27 DIAGNOSIS — M542 Cervicalgia: Secondary | ICD-10-CM | POA: Diagnosis not present

## 2023-08-27 DIAGNOSIS — M79671 Pain in right foot: Secondary | ICD-10-CM | POA: Diagnosis not present

## 2023-08-27 DIAGNOSIS — M069 Rheumatoid arthritis, unspecified: Secondary | ICD-10-CM | POA: Diagnosis not present

## 2023-08-27 DIAGNOSIS — M79643 Pain in unspecified hand: Secondary | ICD-10-CM | POA: Diagnosis not present

## 2023-08-27 DIAGNOSIS — M1990 Unspecified osteoarthritis, unspecified site: Secondary | ICD-10-CM | POA: Diagnosis not present

## 2023-08-27 DIAGNOSIS — Z79899 Other long term (current) drug therapy: Secondary | ICD-10-CM | POA: Diagnosis not present

## 2023-09-02 ENCOUNTER — Encounter: Payer: Medicare PPO | Admitting: Physical Therapy

## 2023-09-09 ENCOUNTER — Other Ambulatory Visit: Payer: Self-pay | Admitting: Internal Medicine

## 2023-09-09 DIAGNOSIS — Z1231 Encounter for screening mammogram for malignant neoplasm of breast: Secondary | ICD-10-CM

## 2023-09-16 DIAGNOSIS — M25579 Pain in unspecified ankle and joints of unspecified foot: Secondary | ICD-10-CM | POA: Diagnosis not present

## 2023-09-16 DIAGNOSIS — M069 Rheumatoid arthritis, unspecified: Secondary | ICD-10-CM | POA: Diagnosis not present

## 2023-09-16 DIAGNOSIS — M79671 Pain in right foot: Secondary | ICD-10-CM | POA: Diagnosis not present

## 2023-09-16 DIAGNOSIS — M25511 Pain in right shoulder: Secondary | ICD-10-CM | POA: Diagnosis not present

## 2023-09-16 DIAGNOSIS — Z79899 Other long term (current) drug therapy: Secondary | ICD-10-CM | POA: Diagnosis not present

## 2023-09-16 DIAGNOSIS — M542 Cervicalgia: Secondary | ICD-10-CM | POA: Diagnosis not present

## 2023-09-16 DIAGNOSIS — M1990 Unspecified osteoarthritis, unspecified site: Secondary | ICD-10-CM | POA: Diagnosis not present

## 2023-09-16 DIAGNOSIS — M79643 Pain in unspecified hand: Secondary | ICD-10-CM | POA: Diagnosis not present

## 2023-09-17 ENCOUNTER — Ambulatory Visit: Payer: Medicare PPO | Admitting: Internal Medicine

## 2023-09-17 ENCOUNTER — Encounter: Payer: Self-pay | Admitting: Internal Medicine

## 2023-09-17 VITALS — BP 140/80 | HR 62 | Temp 98.7°F | Ht 66.0 in | Wt 160.0 lb

## 2023-09-17 DIAGNOSIS — M069 Rheumatoid arthritis, unspecified: Secondary | ICD-10-CM | POA: Diagnosis not present

## 2023-09-17 DIAGNOSIS — I10 Essential (primary) hypertension: Secondary | ICD-10-CM

## 2023-09-17 DIAGNOSIS — R7303 Prediabetes: Secondary | ICD-10-CM

## 2023-09-17 NOTE — Assessment & Plan Note (Signed)
Sounds like she was recently tried on rinvoq and had several side effects. I suspect that the worsening cramps are from nausea (less appetite). She did drink extra 3 bottles of water and less cramping for a few days. Nausea is fading and should continue to improve. Lightheadedness likely from similar with less intake she had less sodium and may have had low BP. She states lightheadedness is improving. Observe closely.

## 2023-09-17 NOTE — Assessment & Plan Note (Signed)
BP is controlled off medications but it is possible she had low BP with poor appetite (nausea) and less sodium intake recently. This is improving and is recurrent lightheadedness may need further evaluation.

## 2023-09-17 NOTE — Assessment & Plan Note (Signed)
We talked about how this is stable on recent labs and should not be related to blood sugar her recent symptoms of lightheadedness and nausea.

## 2023-09-17 NOTE — Progress Notes (Signed)
   Subjective:   Patient ID: Jessica [name], female    DOB: Feb 05, 1951, 72 y.o.   MRN: [id number]  HPI The patient is a 72 YO female coming in for nausea with starting new medication (she thinks rinvoq unsure) for her RA. She took this for about 1 week. Due to new severe headaches and nausea was taken off this about 1-2 weeks ago by rheumatology. They are working on another option for her to take. She had seen them early November and gotten a shot and told she had inflammation. Since the nausea she is having some lightheadedness. She is also having some weakness right leg at the ankle. Worsening cramps as well.   Review of Systems  Constitutional:  Positive for fever.  HENT: Negative.    Eyes: Negative.   Respiratory:  Negative for cough, chest tightness and shortness of breath.   Cardiovascular:  Negative for chest pain, palpitations and leg swelling.  Gastrointestinal:  Positive for nausea. Negative for abdominal distention, abdominal pain, constipation, diarrhea and vomiting.  Musculoskeletal:  Positive for myalgias.  Skin: Negative.   Neurological:  Positive for light-headedness and headaches.  Psychiatric/Behavioral: Negative.      Objective:  Physical Exam Constitutional:      Appearance: She is well-developed.  HENT:     Head: Normocephalic and atraumatic.  Cardiovascular:     Rate and Rhythm: Normal rate and regular rhythm.  Pulmonary:     Effort: Pulmonary effort is normal. No respiratory distress.     Breath sounds: Normal breath sounds. No wheezing or rales.  Abdominal:     General: Bowel sounds are normal. There is no distension.     Palpations: Abdomen is soft.     Tenderness: There is no abdominal tenderness. There is no rebound.  Musculoskeletal:        General: Tenderness present.     Cervical back: Normal range of motion.  Skin:    General: Skin is warm and dry.  Neurological:     Mental Status: She is alert and oriented to person, place, and time.      Coordination: Coordination normal.     Vitals:   09/17/23 1015 09/17/23 1022  BP: (!) 140/80 (!) 140/80  Pulse: 62   Temp: 98.7 F (37.1 C)   TempSrc: Oral   SpO2: 99%   Weight: 160 lb (72.6 kg)   Height: 5\' 6"  (1.676 m)     Assessment & Plan:  Visit time 20 minutes in face to face communication with patient and coordination of care, additional 10 minutes spent in record review, coordination or care, ordering tests, communicating/referring to other healthcare professionals, documenting in medical records all on the same day of the visit for total time 30 minutes spent on the visit.

## 2023-09-23 ENCOUNTER — Encounter: Payer: Medicare PPO | Admitting: Physical Therapy

## 2023-09-24 ENCOUNTER — Encounter: Payer: Medicare PPO | Admitting: Physical Therapy

## 2023-09-26 ENCOUNTER — Encounter: Payer: Medicare PPO | Admitting: Physical Therapy

## 2023-09-30 ENCOUNTER — Encounter: Payer: Medicare PPO | Admitting: Physical Therapy

## 2023-10-10 ENCOUNTER — Encounter (HOSPITAL_BASED_OUTPATIENT_CLINIC_OR_DEPARTMENT_OTHER): Payer: Self-pay | Admitting: *Deleted

## 2023-10-10 ENCOUNTER — Other Ambulatory Visit: Payer: Self-pay

## 2023-10-10 ENCOUNTER — Emergency Department (HOSPITAL_BASED_OUTPATIENT_CLINIC_OR_DEPARTMENT_OTHER)
Admission: EM | Admit: 2023-10-10 | Discharge: 2023-10-10 | Disposition: A | Discharge status: Home / Self Care | Payer: Medicare PPO | Attending: Emergency Medicine | Admitting: Emergency Medicine

## 2023-10-10 ENCOUNTER — Emergency Department (HOSPITAL_BASED_OUTPATIENT_CLINIC_OR_DEPARTMENT_OTHER): Payer: Medicare PPO

## 2023-10-10 DIAGNOSIS — Z7982 Long term (current) use of aspirin: Secondary | ICD-10-CM | POA: Diagnosis not present

## 2023-10-10 DIAGNOSIS — M660 Rupture of popliteal cyst: Secondary | ICD-10-CM | POA: Diagnosis not present

## 2023-10-10 DIAGNOSIS — M79605 Pain in left leg: Secondary | ICD-10-CM | POA: Diagnosis not present

## 2023-10-10 DIAGNOSIS — M25562 Pain in left knee: Secondary | ICD-10-CM | POA: Diagnosis present

## 2023-10-10 DIAGNOSIS — M7122 Synovial cyst of popliteal space [Baker], left knee: Secondary | ICD-10-CM | POA: Diagnosis not present

## 2023-10-10 DIAGNOSIS — R600 Localized edema: Secondary | ICD-10-CM | POA: Diagnosis not present

## 2023-10-10 NOTE — ED Provider Notes (Signed)
[place] [facility] Provider Note   CSN: [id number] Arrival date & time: 10/10/23  1212     History  Chief Complaint  Patient presents with   Leg Pain    Jessica [name] is a 72 y.o. female who presents with concern for a sudden pain behind her left knee that started about 1 AM this morning.  She denies any calf pain or swelling, shortness of breath, or chest pain.  She denies any history of a blood clot.  Reports this has happened before and she was told it was a Baker's cyst.  States this feels similar.  Denies any recent long plane or car rides, recent hospitalizations or surgeries, any hormonal medication use.   Leg Pain      Home Medications Prior to Admission medications   Medication Sig Start Date End Date Taking? Authorizing Provider  Alpha-D-Galactosidase (ECK FOOD ENZYME PO) Take by mouth. 1 tablet po every day    [provider]  Ascorbic Acid (VITAMIN C) 1000 MG tablet Take 2,000 mg by mouth daily.    [provider]  aspirin EC 81 MG tablet Take 1 tablet (81 mg total) by mouth daily. Swallow whole. 05/06/23   Antonieta Iba, MD  Cholecalciferol (VITAMIN D-3) 125 MCG (5000 UT) TABS Take 5,000 Units by mouth daily.    [provider]  cyclobenzaprine (FLEXERIL) 10 MG tablet Take 1 tablet (10 mg total) by mouth 2 (two) times daily as needed for muscle spasms. 05/04/23   Fayrene Helper, PA-C  Evolocumab (REPATHA SURECLICK) 140 MG/ML SOAJ Inject 140 mg into the skin every 14 (fourteen) days. 05/06/23   Antonieta Iba, MD  ibuprofen (ADVIL) 600 MG tablet Take 1 tablet (600 mg total) by mouth every 6 (six) hours as needed. 05/04/23   Fayrene Helper, PA-C  loratadine (CLARITIN) 10 MG tablet Take 10 mg by mouth every evening.     [provider]  magnesium 30 MG tablet Take 30 mg by mouth daily.    [provider]  Magnesium Gluconate 550 MG TABS     [provider]  Multiple  Vitamin (MULTI VITAMIN) TABS     [provider]  Multiple Vitamin (MULTIVITAMIN WITH MINERALS) TABS tablet Take 1 tablet by mouth daily.    [provider]  Omega-3 Fatty Acids (OMEGA-3 EPA FISH OIL PO) Take by mouth. 1 capsule po every day    [provider]  predniSONE (DELTASONE) 10 MG tablet Take 10 mg by mouth as directed.    [provider]      Allergies    Codeine, Sulfa antibiotics, and Sulfacetamide    Review of Systems   Review of Systems  Musculoskeletal:        Left knee pain    Physical Exam Updated Vital Signs BP 126/87 (BP Location: Right Arm)   Pulse 79   Temp 98 F (36.7 C) (Oral)   Resp 18   SpO2 98%  Physical Exam Vitals and nursing note reviewed.  Constitutional:      Appearance: Normal appearance.     Comments: Well-appearing, able to ambulate without difficulty  HENT:     Head: Atraumatic.  Cardiovascular:     Rate and Rhythm: Normal rate and regular rhythm.     Comments: Dorsalis pedis pulse 2+ bilaterally Pulmonary:     Effort: Pulmonary effort is normal.  Musculoskeletal:     Comments: Left lower extremity: No obvious deformity, erythema, edema  Tender to palpation in the popliteal fossa.  No tenderness to palpation of the femur, tibia, medial or lateral joint lines.  Able to fully flex and extend left knee No calf tenderness to palpation bilaterally  Neurological:     General: No focal deficit present.     Mental Status: She is alert.  Psychiatric:        Mood and Affect: Mood normal.        Behavior: Behavior normal.     ED Results / Procedures / Treatments   Labs (all labs ordered are listed, but only abnormal results are displayed) Labs Reviewed - No data to display  EKG None  Radiology US Venous Img Lower Unilateral Left Result Date: 10/10/2023 CLINICAL DATA:  Left lower extremity pain and edema. Evaluate for DVT. EXAM: LEFT LOWER EXTREMITY VENOUS DOPPLER ULTRASOUND TECHNIQUE: Gray-scale  sonography with graded compression, as well as color Doppler and duplex ultrasound were performed to evaluate the lower extremity deep venous systems from the level of the common femoral vein and including the common femoral, femoral, profunda femoral, popliteal and calf veins including the posterior tibial, peroneal and gastrocnemius veins when visible. The superficial great saphenous vein was also interrogated. Spectral Doppler was utilized to evaluate flow at rest and with distal augmentation maneuvers in the common femoral, femoral and popliteal veins. COMPARISON:  None Available. FINDINGS: Contralateral Common Femoral Vein: Respiratory phasicity is normal and symmetric with the symptomatic side. No evidence of thrombus. Normal compressibility. Common Femoral Vein: No evidence of thrombus. Normal compressibility, respiratory phasicity and response to augmentation. Saphenofemoral Junction: No evidence of thrombus. Normal compressibility and flow on color Doppler imaging. Profunda Femoral Vein: No evidence of thrombus. Normal compressibility and flow on color Doppler imaging. Femoral Vein: No evidence of thrombus. Normal compressibility, respiratory phasicity and response to augmentation. Popliteal Vein: No evidence of thrombus. Normal compressibility, respiratory phasicity and response to augmentation. Calf Veins: No evidence of thrombus. Normal compressibility and flow on color Doppler imaging. Superficial Great Saphenous Vein: No evidence of thrombus. Normal compressibility. Other Findings:  None. IMPRESSION: No evidence of DVT within the left lower extremity. Electronically Signed   By: Simonne Come M.D.   On: 10/10/2023 14:40    Procedures Procedures    Medications Ordered in ED Medications - No data to display  ED Course/ Medical Decision Making/ A&P                                 Medical Decision Making    Differential diagnosis includes but is not limited to musculoskeletal pain, ruptured  Baker's cyst, ligamentous injury, DVT  ED Course:  Patient well-appearing and able to ambulate without difficulty.  Pain is located in the popliteal fossa, no pain elsewhere.  She denies any trauma to the area, this pain came on while she was sleeping last night.  Low concern for any ligamentous injury, fracture, or dislocation.  Neurovascular intact in the lower extremities bilaterally.  Denies any lower extremity edema or calf pain.  Ultrasound of the left lower extremity without signs of DVT.  Low suspicion for DVT at this time.  Suspect her pain is likely due to ruptured Baker's cyst due to location and sudden onset.   Impression: Ruptured Baker's cyst, left knee  Disposition:  The patient was discharged home with instructions to return to activities as tolerated.  Use Tylenol as needed for pain.  Follow-up with PCP if symptoms do  not start to improve in the next week. Return precautions given.  Imaging Studies ordered: I ordered imaging studies including vascular ultrasound left lower extremity I independently visualized the imaging with scope of interpretation limited to determining acute life threatening conditions related to emergency care. Imaging showed no signs of DVT I agree with the radiologist interpretation             Final Clinical Impression(s) / ED Diagnoses Final diagnoses:  Ruptured Bakers cyst    Rx / DC Orders ED Discharge Orders     None         Arabella Merles, Cordelia Poche 10/10/23 1522    Rondel Baton, MD 10/10/23 2000

## 2023-10-10 NOTE — Discharge Instructions (Addendum)
Your knee pain is likely due to a ruptured Baker's cyst.  Your ultrasound did not show any blood clot.  The pain will resolve on its own as the fluid is absorbed.  You may take up to 1000mg  of tylenol every 6 hours as needed for pain.  Do not take more then 4g per day.  You may continue activities as tolerated.  Please follow-up with your PCP if your symptoms not start to improve in the next week.  Return to the ER for any chest pain, shortness of breath, calf pain or swelling, any other new or concerning symptoms.

## 2023-10-10 NOTE — ED Triage Notes (Signed)
Left leg pain.  Pt reports pain behind left knee.  Pain began around 1am today when she woke up during the night with this pain.No recent immobility, travel or surgery and no swelling, not associated with any trauma.

## 2023-10-21 DIAGNOSIS — H524 Presbyopia: Secondary | ICD-10-CM | POA: Diagnosis not present

## 2023-10-21 DIAGNOSIS — H04213 Epiphora due to excess lacrimation, bilateral lacrimal glands: Secondary | ICD-10-CM | POA: Diagnosis not present

## 2023-10-21 DIAGNOSIS — H40013 Open angle with borderline findings, low risk, bilateral: Secondary | ICD-10-CM | POA: Diagnosis not present

## 2023-10-21 DIAGNOSIS — H16223 Keratoconjunctivitis sicca, not specified as Sjogren's, bilateral: Secondary | ICD-10-CM | POA: Diagnosis not present

## 2023-10-25 ENCOUNTER — Encounter: Payer: Self-pay | Admitting: Internal Medicine

## 2023-10-29 ENCOUNTER — Ambulatory Visit: Payer: Medicare PPO | Admitting: Family Medicine

## 2023-10-29 ENCOUNTER — Encounter: Payer: Self-pay | Admitting: Family Medicine

## 2023-10-29 ENCOUNTER — Other Ambulatory Visit: Payer: Self-pay

## 2023-10-29 VITALS — BP 122/80 | HR 83 | Ht 66.0 in | Wt 151.0 lb

## 2023-10-29 DIAGNOSIS — M0579 Rheumatoid arthritis with rheumatoid factor of multiple sites without organ or systems involvement: Secondary | ICD-10-CM | POA: Diagnosis not present

## 2023-10-29 DIAGNOSIS — M25561 Pain in right knee: Secondary | ICD-10-CM | POA: Diagnosis not present

## 2023-10-29 DIAGNOSIS — M25562 Pain in left knee: Secondary | ICD-10-CM | POA: Diagnosis not present

## 2023-10-29 DIAGNOSIS — G8929 Other chronic pain: Secondary | ICD-10-CM

## 2023-10-29 NOTE — Patient Instructions (Addendum)
 Thank you for coming in today.  You received an injection today. Seek immediate medical attention if the joint becomes red, extremely painful, or is oozing fluid.  Follow up with your Rheumatologist about your medications.   See [place] back as needed

## 2023-10-29 NOTE — Progress Notes (Signed)
 I, Jessica [name], CMA acting as a scribe for Jessica Lloyd, MD.  Jessica [name] is a 73 y.o. female who presents to [facility] at [facility] today for left knee pain.  Patient was previously seen by Dr. Paulett on 08/15/2023 for RA and hand pain.  Today, patient c/o left knee pain ongoing since December 19.  She was seen at the ED when the pain started.  Patient locates pain to medial aspect of the knee. Denies visible swelling. Denies mechanical sx. Pain radiating into the lower legs and feet.   L Knee swelling: no visible swelling Mechanical symptoms: no Radiates: LE Aggravates: sit-to-stand Treatments tried: topical analgesic - minimal relief.   She notes the contralateral right knee is quite painful as well and having some swelling.  She has rheumatoid arthritis and is not currently taking any medication.  She was in the process of being approved for some of the rheumatology medicine but has not heard back from her otologist office since November.  Also c/o bilateral hand pain and stiffness.   Dx testing: 10/10/2023 LE vasc US   Pertinent review of systems: No fevers or chills  Relevant historical information: Hypertension   Exam:  BP 122/80   Pulse 83   Ht 5' 6 (1.676 m)   Wt 151 lb (68.5 kg)   SpO2 98%   BMI 24.37 kg/m  General: Well Developed, well nourished, and in no acute distress.   MSK: Knees bilaterally moderate effusion normal motion with crepitation.    Lab and Radiology Results  Procedure: Real-time Ultrasound Guided Injection of right knee joint superior lateral patella space Device: Philips Affiniti 50G/GE Logiq Images permanently stored and available for review in PACS Verbal informed consent obtained.  Discussed risks and benefits of procedure. Warned about infection, bleeding, hyperglycemia damage to structures among others. Patient expresses understanding and agreement Time-out conducted.   Noted no overlying erythema,  induration, or other signs of local infection.   Skin prepped in a sterile fashion.   Local anesthesia: Topical Ethyl chloride.   With sterile technique and under real time ultrasound guidance: 40 mg of Kenalog  and 2 mL of Marcaine injected into knee joint. Fluid seen entering the joint capsule.   Completed without difficulty   Pain immediately resolved suggesting accurate placement of the medication.   Advised to call if fevers/chills, erythema, induration, drainage, or persistent bleeding.   Images permanently stored and available for review in the ultrasound unit.  Impression: Technically successful ultrasound guided injection.   Procedure: Real-time Ultrasound Guided Injection of left knee joint superior lateral patella space Device: Philips Affiniti 50G/GE Logiq Images permanently stored and available for review in PACS Verbal informed consent obtained.  Discussed risks and benefits of procedure. Warned about infection, bleeding, hyperglycemia damage to structures among others. Patient expresses understanding and agreement Time-out conducted.   Noted no overlying erythema, induration, or other signs of local infection.   Skin prepped in a sterile fashion.   Local anesthesia: Topical Ethyl chloride.   With sterile technique and under real time ultrasound guidance: 40 mg of Kenalog  and 2 mL of Marcaine injected into knee joint. Fluid seen entering the joint capsule.   Completed without difficulty   Pain immediately resolved suggesting accurate placement of the medication.   Advised to call if fevers/chills, erythema, induration, drainage, or persistent bleeding.   Images permanently stored and available for review in the ultrasound unit.  Impression: Technically successful ultrasound guided injection.  Assessment and Plan: 73 y.o. female with bilateral knee pain due to exacerbation of underlying osteoarthritis and due to exacerbation of rheumatoid arthritis.  Plan for  intra-articular steroid injection both knees today.  However she should contact her rheumatology office to inquire about the status of what I think is Humira injection authorization.  I think is going to be hard to get her overall aches and pains better controlled her for her rheumatoid arthritis is not controlled.   PDMP not reviewed this encounter. Orders Placed This Encounter  Procedures   US  LIMITED JOINT SPACE STRUCTURES LOW LEFT(NO LINKED CHARGES)    Reason for Exam (SYMPTOM  OR DIAGNOSIS REQUIRED):   left knee pain    Preferred imaging location?:   [facility]   No orders of the defined types were placed in this encounter.    Discussed warning signs or symptoms. Please see discharge instructions. Patient expresses understanding.   The above documentation has been reviewed and is accurate and complete Jessica [name], M.D.

## 2023-11-21 ENCOUNTER — Ambulatory Visit
Admission: RE | Admit: 2023-11-21 | Discharge: 2023-11-21 | Disposition: A | Discharge status: Home / Self Care | Payer: Medicare PPO | Source: Ambulatory Visit | Attending: Internal Medicine | Admitting: Internal Medicine

## 2023-11-21 ENCOUNTER — Encounter: Payer: Self-pay | Admitting: Internal Medicine

## 2023-11-21 ENCOUNTER — Ambulatory Visit: Payer: Medicare PPO | Admitting: Internal Medicine

## 2023-11-21 VITALS — BP 140/80 | HR 72 | Temp 98.2°F | Ht 66.0 in | Wt 151.0 lb

## 2023-11-21 DIAGNOSIS — R109 Unspecified abdominal pain: Secondary | ICD-10-CM

## 2023-11-21 DIAGNOSIS — K5900 Constipation, unspecified: Secondary | ICD-10-CM

## 2023-11-21 DIAGNOSIS — R110 Nausea: Secondary | ICD-10-CM | POA: Diagnosis not present

## 2023-11-21 DIAGNOSIS — R634 Abnormal weight loss: Secondary | ICD-10-CM | POA: Diagnosis not present

## 2023-11-21 DIAGNOSIS — I700 Atherosclerosis of aorta: Secondary | ICD-10-CM | POA: Diagnosis not present

## 2023-11-21 DIAGNOSIS — N200 Calculus of kidney: Secondary | ICD-10-CM | POA: Diagnosis not present

## 2023-11-21 LAB — COMPREHENSIVE METABOLIC PANEL
ALT: 10 U/L (ref 0–35)
AST: 20 U/L (ref 0–37)
Albumin: 3.8 g/dL (ref 3.5–5.2)
Alkaline Phosphatase: 51 U/L (ref 39–117)
BUN: 13 mg/dL (ref 6–23)
CO2: 28 meq/L (ref 19–32)
Calcium: 10 mg/dL (ref 8.4–10.5)
Chloride: 101 meq/L (ref 96–112)
Creatinine, Ser: 0.79 mg/dL (ref 0.40–1.20)
GFR: 74.31 mL/min (ref >60.00)
Glucose, Bld: 104 mg/dL — ABNORMAL HIGH (ref 70–99)
Potassium: 3.8 meq/L (ref 3.5–5.1)
Sodium: 139 meq/L (ref 135–145)
Total Bilirubin: 0.5 mg/dL (ref 0.2–1.2)
Total Protein: 7.3 g/dL (ref 6.0–8.3)

## 2023-11-21 LAB — LIPASE: Lipase: 55 U/L (ref 11.0–59.0)

## 2023-11-21 LAB — CBC
HCT: 34.4 % — ABNORMAL LOW (ref 36.0–46.0)
Hemoglobin: 10.9 g/dL — ABNORMAL LOW (ref 12.0–15.0)
MCHC: 31.8 g/dL (ref 30.0–36.0)
MCV: 84.2 fL (ref 78.0–100.0)
Platelets: 325 10*3/uL (ref 150.0–400.0)
RBC: 4.09 Mil/uL (ref 3.87–5.11)
RDW: 18.1 % — ABNORMAL HIGH (ref 11.5–15.5)
WBC: 5.9 10*3/uL (ref 4.0–10.5)

## 2023-11-21 NOTE — Assessment & Plan Note (Signed)
Checking CBC, CMP, lipase and CT abdomen and pelvis with ongoing nausea and weight loss this is concerning.

## 2023-11-21 NOTE — Progress Notes (Signed)
   Subjective:   Patient ID: Jessica [name], female    DOB: 1951-07-26, 73 y.o.   MRN: [id number]  HPI The patient is a 73 YO female coming in for ongoing nausea and constipation. She is using smooth move tea which helps some and regular now. Still having abdominal pain and struggling maybe with hemorrhoids. She is still losing weight for no reason. Trying to eat and drink  plenty of fluids. Gi unable to get her in until April.   Review of Systems  Constitutional: Negative.   HENT: Negative.    Eyes: Negative.   Respiratory:  Negative for cough, chest tightness and shortness of breath.   Cardiovascular:  Negative for chest pain, palpitations and leg swelling.  Gastrointestinal:  Positive for abdominal pain, constipation and nausea. Negative for abdominal distention, diarrhea and vomiting.  Musculoskeletal: Negative.   Skin: Negative.   Neurological: Negative.   Psychiatric/Behavioral: Negative.      Objective:  Physical Exam Constitutional:      Appearance: She is well-developed.     Comments: Appears to have lost weight recently  HENT:     Head: Normocephalic and atraumatic.  Cardiovascular:     Rate and Rhythm: Normal rate and regular rhythm.  Pulmonary:     Effort: Pulmonary effort is normal. No respiratory distress.     Breath sounds: Normal breath sounds. No wheezing or rales.  Abdominal:     General: Bowel sounds are normal. There is no distension.     Palpations: Abdomen is soft.     Tenderness: There is abdominal tenderness. There is no rebound.  Musculoskeletal:     Cervical back: Normal range of motion.  Skin:    General: Skin is warm and dry.  Neurological:     Mental Status: She is alert and oriented to person, place, and time.     Coordination: Coordination normal.     Vitals:   11/21/23 0943 11/21/23 0946  BP: (!) 140/80 (!) 140/80  Pulse: 72   Temp: 98.2 F (36.8 C)   TempSrc: Oral   SpO2: 97%   Weight: 151 lb (68.5 kg)   Height: 5\' 6"   (1.676 m)     Assessment & Plan:

## 2023-11-21 NOTE — Assessment & Plan Note (Signed)
Weight is down about 9 pounds since September 17, 2023. This is concerning and she is having GI symptoms. Checking CBC, CMP, lipase and CT abdomen and pelvis to assess new GI symptoms. Referral to outside GI since she is unable to get in locally in a timely fashion.

## 2023-11-21 NOTE — Assessment & Plan Note (Signed)
Nausea has not resolved with medication change for RA and concerning in the setting of weight loss and other GI symptoms.

## 2023-11-21 NOTE — Assessment & Plan Note (Signed)
Fairly new and concerning in setting of weight loss. Due for colonoscopy referral done to outside GI as she is unable to get in with local in timely fashion. Checking CT abdomen and pelvis as none recently and nausea as well as weight loss.

## 2023-11-21 NOTE — Patient Instructions (Signed)
We will do the labs and CT scan of the stomach. We will get you in soon er with GI

## 2023-11-22 ENCOUNTER — Encounter: Payer: Self-pay | Admitting: Internal Medicine

## 2023-11-23 ENCOUNTER — Encounter: Payer: Self-pay | Admitting: Internal Medicine

## 2023-11-24 ENCOUNTER — Encounter: Payer: Self-pay | Admitting: Internal Medicine

## 2023-11-25 NOTE — Telephone Encounter (Signed)
2nd part message

## 2023-11-26 ENCOUNTER — Ambulatory Visit: Payer: Medicare PPO

## 2023-11-27 ENCOUNTER — Ambulatory Visit (INDEPENDENT_AMBULATORY_CARE_PROVIDER_SITE_OTHER): Payer: Medicare PPO

## 2023-11-27 ENCOUNTER — Ambulatory Visit: Payer: Medicare PPO | Admitting: Podiatry

## 2023-11-27 ENCOUNTER — Encounter: Payer: Self-pay | Admitting: Podiatry

## 2023-11-27 DIAGNOSIS — M779 Enthesopathy, unspecified: Secondary | ICD-10-CM

## 2023-11-27 DIAGNOSIS — M778 Other enthesopathies, not elsewhere classified: Secondary | ICD-10-CM

## 2023-11-27 DIAGNOSIS — M19071 Primary osteoarthritis, right ankle and foot: Secondary | ICD-10-CM | POA: Diagnosis not present

## 2023-11-27 MED ORDER — PREDNISONE 5 MG PO TABS: ORAL_TABLET | ORAL | 0 refills | Status: AC

## 2023-11-27 NOTE — Patient Instructions (Signed)
 Anterior Tibial Tendinitis Rehab Ask your health care provider which exercises are safe for you. Do exercises exactly as told by your health care provider and adjust them as directed. It is normal to feel mild stretching, pulling, tightness, or discomfort as you do these exercises. Stop right away if you feel sudden pain or your pain gets worse. Do not begin these exercises until told by your health care provider. Stretching and range-of-motion exercises These exercises warm up your muscles and joints and improve the movement and flexibility in your ankle and foot. These exercises may also help to relieve pain. Standing wall calf stretch, knee straight   Stand with your hands against a wall. Extend your left / right leg behind you, and bend your front knee slightly. If directed, place a folded washcloth under the arch of your foot for support. Point the toes of your back foot slightly inward. Keeping your heels on the floor and your back knee straight, shift your weight toward the wall. Do not allow your back to arch. You should feel a gentle stretch in your upper left / right calf. Hold this position for 10 seconds. Repeat 10 times. Complete this exercise 2 times a day. Standing wall calf stretch, knee bent Stand with your hands against a wall. Extend your left / right leg behind you, and bend your front knee slightly. If directed, place a folded washcloth under the arch of your foot for support. Point the toes of your back foot slightly inward. Unlock your back knee so it is bent. Keep your heels on the floor. You should feel a gentle stretch deep in your lower left / right calf. Hold this position for 10 seconds. Repeat 10 times. Complete this exercise 2 times a day. Strengthening exercises These exercises build strength and endurance in your ankle and foot. Endurance is the ability to use your muscles for a long time, even after they get tired. Ankle inversion with band Secure one end of  a rubber exercise band or tubing to a fixed object, such as a table leg or a pole, that will stay still when the band is pulled. Loop the other end of the band around the middle of your left / right foot. Sit on the floor facing the object with your left / right leg extended. The band or tube should be slightly tense when your foot is relaxed. Leading with your big toe, slowly bring your left / right foot and ankle inward, toward your other foot (inversion). Hold this position for 10 seconds. Slowly return your foot to the starting position. Repeat 10 times. Complete this exercise 2 times a day. Towel curls   Sit in a chair on a non-carpeted surface, and put your feet on the floor. Place a towel in front of your feet. Keeping your heel on the floor, put your left / right foot on the towel. Pull the towel toward you by grabbing the towel with your toes and curling them under. Keep your heel on the floor while you do this. Let your toes relax. Grab the towel with your toes again. Keep going until the towel is completely underneath your foot. Repeat 10 times. Complete this exercise 2 times a day. Balance exercise This exercise improves or maintains your balance. Balance is important in preventing falls. Single leg stand Without wearing shoes, stand near a railing or in a doorway. You may hold on to the railing or door frame as needed for balance. Stand on your left /  right foot. Keep your big toe down on the floor and try to keep your arch lifted. If balancing in this position is too easy, try the exercise with your eyes closed or while standing on a pillow. Hold this position for 10 seconds. Repeat 10 times. Complete this exercise 2 times a day. This information is not intended to replace advice given to you by your health care provider. Make sure you discuss any questions you have with your health care provider.

## 2023-11-27 NOTE — Progress Notes (Signed)
 Subjective:  Patient ID: Jessica [name], female    DOB: 06-Sep-1951,  MRN: [id number]  Chief Complaint  Patient presents with   Foot Pain    This right foot has pain.  It have severe pain when I'm walking.  It's swelling.    Discussed the use of AI scribe software for clinical note transcription with the patient, who gave verbal consent to proceed.  History of Present Illness   Jessica [name] is a 73 year old female with rheumatoid arthritis who presents with right foot pain.  She has been experiencing significant right foot pain for the past three weeks, severe enough to cause her to hop at times. The pain is localized to the center of the foot, with specific areas of discomfort on the dorsal and medial sides. She notes possible swelling in these areas. She uses shoe inserts to alleviate the pain, which has helped tremendously.  She associates the pain with a callus in the area, linked to a bunion that had previously been surgically treated. She mentions that the bunion may have started to recur. She has been using bunion foot socks, which she finds helpful in straightening her foot.  Her medical history includes rheumatoid arthritis, which she acknowledges could affect her foot. She does not take prednisone  regularly but uses it as needed during flare-ups. No issues with cortisone injections in the past. No reported allergies to medications that cause swelling.          Objective:    Physical Exam   CARDIOVASCULAR: Good capillary fill time. MUSCULOSKELETAL: Mild hallux valgus deformity with callous plantar medial bilateral, nontender. Pain on palpation to the dorsal and medial navicular cuneiform joint deep to the TA tendon. No pain on palpation of the tendon or with resisted dorsiflexion. Diffuse midfoot arthritis, moderate arthritis in the second TMT and navicular cuneiform joints, plantar osteoarthritis, and capsulitis of the midfoot.       No images are attached  to the encounter.    Results   Procedure: Corticosteroid injection Description: Sterile preparation with alcohol. Injection of 4 mg of dexamethasone  and 0.5 cc of 0.5% lidocaine  into the dorsal medial navicular cuneiform joint, avoiding the TA tendon sheath. Informed Consent: Discussed the option of corticosteroid injection for quicker relief compared to oral prednisone . Patient agreed to proceed with the injection.  RADIOLOGY Radiograph: Diffuse midfoot arthritis in the second TMT and navicular cuneiform joints, moderate in nature, with plantar osteoarthritis and capsulitis of the midfoot.      Assessment:   1. Capsulitis of right foot   2. 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  Assessment and Plan    Foot Pain Severe pain in the right foot for the past three weeks, localized to the navicular-cuneiform joint. Possible osteoarthritis or rheumatoid arthritis. Mild hallux valgus deformity with plantar medial callus noted bilaterally. -Administered corticosteroid injection (4mg  dexamethasone  and 0.5cc of 0.5) into the dorsal medial [place] joint. -Recommended rest for the next five days. -Provided home exercises for physical therapy. -If pain persists in 3-4 weeks, consider formal physical therapy and a prednisone  taper (prescription sent to pharmacy).  Bunion Post-surgical recurrence of bunion. Patient reports relief with use of bunion foot socks. -Continue use of bunion foot socks. -Monitor for any worsening or spread of condition.  Callus Noted on both feet, likely due to pressure points. Patient reports relief with shoe inserts. -Continue use of shoe  inserts. -Monitor for any worsening or spread of condition.  Follow-up as needed.          Return if symptoms worsen or fail to improve.

## 2023-12-02 DIAGNOSIS — M06842 Other specified rheumatoid arthritis, left hand: Secondary | ICD-10-CM | POA: Diagnosis not present

## 2023-12-02 DIAGNOSIS — M65331 Trigger finger, right middle finger: Secondary | ICD-10-CM | POA: Diagnosis not present

## 2023-12-02 DIAGNOSIS — M06841 Other specified rheumatoid arthritis, right hand: Secondary | ICD-10-CM | POA: Diagnosis not present

## 2023-12-02 DIAGNOSIS — M65332 Trigger finger, left middle finger: Secondary | ICD-10-CM | POA: Diagnosis not present

## 2023-12-03 ENCOUNTER — Encounter: Payer: Self-pay | Admitting: Internal Medicine

## 2023-12-03 DIAGNOSIS — R634 Abnormal weight loss: Secondary | ICD-10-CM

## 2023-12-03 DIAGNOSIS — Z860100 Personal history of colon polyps, unspecified: Secondary | ICD-10-CM | POA: Diagnosis not present

## 2023-12-03 DIAGNOSIS — K5909 Other constipation: Secondary | ICD-10-CM | POA: Diagnosis not present

## 2023-12-03 DIAGNOSIS — K5900 Constipation, unspecified: Secondary | ICD-10-CM

## 2023-12-03 NOTE — Telephone Encounter (Signed)
Patient is ok with seeing someone else for GI. She states that she is ok with waiting. I have also advised her that she will have to start the process over again and this could take months to do

## 2023-12-04 ENCOUNTER — Ambulatory Visit
Admission: RE | Admit: 2023-12-04 | Discharge: 2023-12-04 | Disposition: A | Discharge status: Home / Self Care | Payer: Medicare PPO | Source: Ambulatory Visit | Attending: Internal Medicine | Admitting: Internal Medicine

## 2023-12-04 DIAGNOSIS — Z1231 Encounter for screening mammogram for malignant neoplasm of breast: Secondary | ICD-10-CM | POA: Diagnosis not present

## 2023-12-04 NOTE — Telephone Encounter (Signed)
Referral placed.

## 2023-12-09 ENCOUNTER — Encounter: Payer: Self-pay | Admitting: Internal Medicine

## 2023-12-09 LAB — HM MAMMOGRAPHY

## 2023-12-11 ENCOUNTER — Telehealth: Payer: Self-pay | Admitting: Gastroenterology

## 2023-12-11 ENCOUNTER — Encounter: Payer: Self-pay | Admitting: Internal Medicine

## 2023-12-11 NOTE — Telephone Encounter (Signed)
Good morning Dr. Leonides Schanz,   I received a call from this patient regarding a referral we had received.Patient stated that she was unaware that she had been referred out of our practice and would like to continue care with [place]. Would you please advise on scheduling.   Thank you.

## 2023-12-13 DIAGNOSIS — M1990 Unspecified osteoarthritis, unspecified site: Secondary | ICD-10-CM | POA: Diagnosis not present

## 2023-12-13 DIAGNOSIS — M25511 Pain in right shoulder: Secondary | ICD-10-CM | POA: Diagnosis not present

## 2023-12-13 DIAGNOSIS — M79643 Pain in unspecified hand: Secondary | ICD-10-CM | POA: Diagnosis not present

## 2023-12-13 DIAGNOSIS — M542 Cervicalgia: Secondary | ICD-10-CM | POA: Diagnosis not present

## 2023-12-13 DIAGNOSIS — M25439 Effusion, unspecified wrist: Secondary | ICD-10-CM | POA: Diagnosis not present

## 2023-12-13 DIAGNOSIS — M069 Rheumatoid arthritis, unspecified: Secondary | ICD-10-CM | POA: Diagnosis not present

## 2023-12-13 DIAGNOSIS — Z79899 Other long term (current) drug therapy: Secondary | ICD-10-CM | POA: Diagnosis not present

## 2023-12-13 DIAGNOSIS — M79671 Pain in right foot: Secondary | ICD-10-CM | POA: Diagnosis not present

## 2023-12-13 DIAGNOSIS — M25579 Pain in unspecified ankle and joints of unspecified foot: Secondary | ICD-10-CM | POA: Diagnosis not present

## 2023-12-18 DIAGNOSIS — Z79899 Other long term (current) drug therapy: Secondary | ICD-10-CM | POA: Diagnosis not present

## 2023-12-31 ENCOUNTER — Ambulatory Visit: Admitting: Podiatry

## 2023-12-31 ENCOUNTER — Encounter: Payer: Self-pay | Admitting: Podiatry

## 2023-12-31 DIAGNOSIS — M19072 Primary osteoarthritis, left ankle and foot: Secondary | ICD-10-CM

## 2023-12-31 DIAGNOSIS — M19071 Primary osteoarthritis, right ankle and foot: Secondary | ICD-10-CM

## 2023-12-31 MED ORDER — BETAMETHASONE SOD PHOS & ACET 6 (3-3) MG/ML IJ SUSP
3.0000 mg | Freq: Once | INTRAMUSCULAR | Status: AC
  Administered 2023-12-31: 3 mg via INTRA_ARTICULAR

## 2023-12-31 NOTE — Progress Notes (Signed)
 Chief Complaint  Patient presents with   Foot Pain    "They both hurt, they're painful to even walk."    HPI: 73 y.o. female presenting today for follow-up evaluation of bilateral foot pain.  History of rheumatoid arthritis.  Patient recently started Humira with her rheumatologist.  She continues to have pain and tenderness of bilateral feet.  She does admit to walking around the house barefoot or in footies only the majority of the day  Past Medical History:  Diagnosis Date   Allergy    Breast cancer (HCC) 2000   Cancer (HCC)    breast   Colon polyp    Coronary artery disease    GERD (gastroesophageal reflux disease)    History of blood transfusion    Hyperlipidemia    Migraine    history of migrarines, none as an adult   Personal history of radiation therapy 2000    Past Surgical History:  Procedure Laterality Date   ABDOMINAL HYSTERECTOMY     BREAST LUMPECTOMY Right 2000   BREAST SURGERY Right    calcifaction removed   BUNIONECTOMY Bilateral    COLONOSCOPY W/ POLYPECTOMY     CORONARY ARTERY BYPASS GRAFT N/A 01/05/2019   Procedure: CORONARY ARTERY BYPASS GRAFTING (CABG), using right leg saphenous endoscopic and open vein harvest, exploration left leg;  Surgeon: Alleen Borne, MD;  Location: [facility] OR;  Service: Open Heart Surgery;  Laterality: N/A;   EYE SURGERY Bilateral    cataract   hemorrhoid     LEFT HEART CATH AND CORONARY ANGIOGRAPHY N/A 01/01/2019   Procedure: LEFT HEART CATH AND CORONARY ANGIOGRAPHY;  Surgeon: Corky Crafts, MD;  Location: [facility] INVASIVE CV LAB;  Service: Cardiovascular;  Laterality: N/A;   STERNAL WIRES REMOVAL N/A 05/11/2019   Procedure: STERNAL WIRES REMOVAL;  Surgeon: Alleen Borne, MD;  Location: [facility] OR;  Service: Thoracic;  Laterality: N/A;   TEE WITHOUT CARDIOVERSION N/A 01/05/2019   Procedure: TRANSESOPHAGEAL ECHOCARDIOGRAM (TEE);  Surgeon: Alleen Borne, MD;  Location: [facility] OR;  Service: Open Heart Surgery;  Laterality: N/A;     Allergies  Allergen Reactions   Codeine Nausea And Vomiting   Sulfa Antibiotics Swelling   Sulfacetamide Swelling     Physical Exam: General: The patient is alert and oriented x3 in no acute distress.  Dermatology: Skin is warm, dry and supple bilateral lower extremities.   Vascular: Palpable pedal pulses bilaterally. Capillary refill within normal limits.  No appreciable edema.  No erythema.  Neurological: Grossly intact via light touch  Musculoskeletal Exam: No pedal deformities noted.  Tenderness with palpation throughout the midtarsal joint bilateral feet.  No crepitus   Assessment/Plan of Care: 1.  Capsulitis/DJD midtarsal joint bilateral 2.  Rheumatoid arthritis  -Patient evaluated -Continue management with rheumatology -Injection of 0.5 cc Celestone Soluspan injected in bilateral midtarsal joint -Advised against going barefoot even around the house.  Recommend good supportive shoes and arch supports.  Patient has her arch supports from the shoe market on [address] in [place] -Return to clinic as needed       Felecia Shelling, DPM [facility]  Dr. Felecia Shelling, DPM    [address], [place] [address]  Office [phone]  Fax [phone]

## 2024-01-09 ENCOUNTER — Ambulatory Visit (AMBULATORY_SURGERY_CENTER): Payer: Medicare PPO

## 2024-01-09 VITALS — Ht 66.0 in | Wt 156.0 lb

## 2024-01-09 DIAGNOSIS — K5900 Constipation, unspecified: Secondary | ICD-10-CM

## 2024-01-09 DIAGNOSIS — Z1211 Encounter for screening for malignant neoplasm of colon: Secondary | ICD-10-CM

## 2024-01-09 MED ORDER — NA SULFATE-K SULFATE-MG SULF 17.5-3.13-1.6 GM/177ML PO SOLN: 1.0000 | Freq: Once | ORAL | 0 refills | Status: AC

## 2024-01-09 NOTE — Progress Notes (Signed)
 No egg or soy allergy known to patient  No issues known to pt with past sedation with any surgeries or procedures Patient denies ever being told they had issues or difficulty with intubation  No FH of Malignant Hyperthermia Pt is not on diet pills; No GLP-1 medications Pt is not on home 02  Pt is not on blood thinners  Pt denies issues with chronic constipation  No A fib or A flutter Have any cardiac testing pending--no Ambulates independently

## 2024-01-16 DIAGNOSIS — M1990 Unspecified osteoarthritis, unspecified site: Secondary | ICD-10-CM | POA: Diagnosis not present

## 2024-01-17 ENCOUNTER — Encounter: Payer: Self-pay | Admitting: Internal Medicine

## 2024-01-20 ENCOUNTER — Telehealth: Payer: Self-pay | Admitting: Internal Medicine

## 2024-01-20 ENCOUNTER — Ambulatory Visit: Admitting: Family Medicine

## 2024-01-20 NOTE — Telephone Encounter (Signed)
 PT is requesting a call about her pre certificatiion. She wants to know why it is not preventative. She also wants to know why her insurance is not paying in full. Requesting a call before she starts the prep today. Procedure is tomorrow. Please advise.

## 2024-01-21 ENCOUNTER — Encounter: Payer: Medicare PPO | Admitting: Internal Medicine

## 2024-01-28 ENCOUNTER — Ambulatory Visit: Admitting: Internal Medicine

## 2024-01-28 ENCOUNTER — Encounter: Payer: Self-pay | Admitting: Internal Medicine

## 2024-01-28 VITALS — BP 122/80 | HR 72 | Temp 99.0°F | Ht 66.0 in | Wt 158.0 lb

## 2024-01-28 DIAGNOSIS — I10 Essential (primary) hypertension: Secondary | ICD-10-CM

## 2024-01-28 DIAGNOSIS — Z1211 Encounter for screening for malignant neoplasm of colon: Secondary | ICD-10-CM

## 2024-01-28 DIAGNOSIS — D509 Iron deficiency anemia, unspecified: Secondary | ICD-10-CM

## 2024-01-28 DIAGNOSIS — G720 Drug-induced myopathy: Secondary | ICD-10-CM | POA: Diagnosis not present

## 2024-01-28 DIAGNOSIS — R7303 Prediabetes: Secondary | ICD-10-CM

## 2024-01-28 DIAGNOSIS — M0579 Rheumatoid arthritis with rheumatoid factor of multiple sites without organ or systems involvement: Secondary | ICD-10-CM | POA: Diagnosis not present

## 2024-01-28 DIAGNOSIS — T466X5A Adverse effect of antihyperlipidemic and antiarteriosclerotic drugs, initial encounter: Secondary | ICD-10-CM

## 2024-01-28 LAB — CBC
HCT: 35.7 % — ABNORMAL LOW (ref 36.0–46.0)
Hemoglobin: 11.6 g/dL — ABNORMAL LOW (ref 12.0–15.0)
MCHC: 32.4 g/dL (ref 30.0–36.0)
MCV: 84.6 fl (ref 78.0–100.0)
Platelets: 335 10*3/uL (ref 150.0–400.0)
RBC: 4.22 Mil/uL (ref 3.87–5.11)
RDW: 17.9 % — ABNORMAL HIGH (ref 11.5–15.5)
WBC: 5.6 10*3/uL (ref 4.0–10.5)

## 2024-01-28 LAB — COMPREHENSIVE METABOLIC PANEL WITH GFR
ALT: 11 U/L (ref 0–35)
AST: 21 U/L (ref 0–37)
Albumin: 3.9 g/dL (ref 3.5–5.2)
Alkaline Phosphatase: 61 U/L (ref 39–117)
BUN: 15 mg/dL (ref 6–23)
CO2: 26 meq/L (ref 19–32)
Calcium: 9.5 mg/dL (ref 8.4–10.5)
Chloride: 99 meq/L (ref 96–112)
Creatinine, Ser: 0.89 mg/dL (ref 0.40–1.20)
GFR: 64.32 mL/min (ref >60.00)
Glucose, Bld: 95 mg/dL (ref 70–99)
Potassium: 3.8 meq/L (ref 3.5–5.1)
Sodium: 136 meq/L (ref 135–145)
Total Bilirubin: 0.5 mg/dL (ref 0.2–1.2)
Total Protein: 7.5 g/dL (ref 6.0–8.3)

## 2024-01-28 LAB — HEMOGLOBIN A1C: Hgb A1c MFr Bld: 5.9 % (ref 4.6–6.5)

## 2024-01-28 LAB — FERRITIN: Ferritin: 244.9 ng/mL (ref 10.0–291.0)

## 2024-01-28 LAB — VITAMIN D 25 HYDROXY (VIT D DEFICIENCY, FRACTURES): VITD: 31.29 ng/mL (ref 30.00–100.00)

## 2024-01-28 LAB — VITAMIN B12: Vitamin B-12: 745 pg/mL (ref 211–911)

## 2024-01-28 NOTE — Progress Notes (Unsigned)
   Subjective:   Patient ID: Jessica [name], female    DOB: 03-Sep-1951, 73 y.o.   MRN: [id number]  HPI The patient is a 73 YO coming in for medical management (see A/P for details).   Review of Systems  Constitutional: Negative.   HENT: Negative.    Eyes: Negative.   Respiratory:  Negative for cough, chest tightness and shortness of breath.   Cardiovascular:  Negative for chest pain, palpitations and leg swelling.  Gastrointestinal:  Negative for abdominal distention, abdominal pain, constipation, diarrhea, nausea and vomiting.  Musculoskeletal: Negative.   Skin: Negative.   Neurological: Negative.   Psychiatric/Behavioral: Negative.      Objective:  Physical Exam Constitutional:      Appearance: She is well-developed.  HENT:     Head: Normocephalic and atraumatic.  Cardiovascular:     Rate and Rhythm: Normal rate and regular rhythm.  Pulmonary:     Effort: Pulmonary effort is normal. No respiratory distress.     Breath sounds: Normal breath sounds. No wheezing or rales.  Abdominal:     General: Bowel sounds are normal. There is no distension.     Palpations: Abdomen is soft.     Tenderness: There is no abdominal tenderness. There is no rebound.  Musculoskeletal:     Cervical back: Normal range of motion.  Skin:    General: Skin is warm and dry.  Neurological:     Mental Status: She is alert and oriented to person, place, and time.     Coordination: Coordination normal.     Vitals:   01/28/24 1346  BP: 122/80  Pulse: 72  Temp: 99 F (37.2 C)  TempSrc: Oral  SpO2: 96%  Weight: 158 lb (71.7 kg)  Height: 5\' 6"  (1.676 m)    Assessment & Plan:

## 2024-01-28 NOTE — Patient Instructions (Addendum)
 We have done the referral. We will check the labs today.

## 2024-01-29 ENCOUNTER — Encounter: Payer: Self-pay | Admitting: Internal Medicine

## 2024-01-29 DIAGNOSIS — M25511 Pain in right shoulder: Secondary | ICD-10-CM | POA: Diagnosis not present

## 2024-01-29 DIAGNOSIS — M79671 Pain in right foot: Secondary | ICD-10-CM | POA: Diagnosis not present

## 2024-01-29 DIAGNOSIS — M25439 Effusion, unspecified wrist: Secondary | ICD-10-CM | POA: Diagnosis not present

## 2024-01-29 DIAGNOSIS — M542 Cervicalgia: Secondary | ICD-10-CM | POA: Diagnosis not present

## 2024-01-29 DIAGNOSIS — M069 Rheumatoid arthritis, unspecified: Secondary | ICD-10-CM | POA: Diagnosis not present

## 2024-01-29 DIAGNOSIS — Z79899 Other long term (current) drug therapy: Secondary | ICD-10-CM | POA: Diagnosis not present

## 2024-01-29 DIAGNOSIS — M1990 Unspecified osteoarthritis, unspecified site: Secondary | ICD-10-CM | POA: Diagnosis not present

## 2024-01-29 DIAGNOSIS — M25579 Pain in unspecified ankle and joints of unspecified foot: Secondary | ICD-10-CM | POA: Diagnosis not present

## 2024-01-29 DIAGNOSIS — M79643 Pain in unspecified hand: Secondary | ICD-10-CM | POA: Diagnosis not present

## 2024-01-31 DIAGNOSIS — D649 Anemia, unspecified: Secondary | ICD-10-CM | POA: Insufficient documentation

## 2024-01-31 DIAGNOSIS — D509 Iron deficiency anemia, unspecified: Secondary | ICD-10-CM | POA: Insufficient documentation

## 2024-01-31 NOTE — Assessment & Plan Note (Signed)
 Checking HGA1c and adjust as needed.

## 2024-01-31 NOTE — Assessment & Plan Note (Signed)
Checking CBC and CMP.  

## 2024-01-31 NOTE — Assessment & Plan Note (Signed)
 Checking CMP and adjust as needed. BP at goal on current regimen.

## 2024-01-31 NOTE — Assessment & Plan Note (Signed)
 Checking CBC and ferritin and B12 and adjust as needed. Still taking oral iron currently.

## 2024-01-31 NOTE — Assessment & Plan Note (Signed)
 Still unable to tolerate and is on repatha.

## 2024-02-10 ENCOUNTER — Encounter: Payer: Self-pay | Admitting: Internal Medicine

## 2024-03-13 ENCOUNTER — Ambulatory Visit: Admitting: Podiatry

## 2024-03-20 ENCOUNTER — Ambulatory Visit: Admitting: Internal Medicine

## 2024-03-20 ENCOUNTER — Encounter: Payer: Self-pay | Admitting: Internal Medicine

## 2024-03-20 VITALS — BP 126/48 | HR 57 | Resp 12

## 2024-03-20 DIAGNOSIS — M069 Rheumatoid arthritis, unspecified: Secondary | ICD-10-CM | POA: Diagnosis not present

## 2024-03-20 DIAGNOSIS — Z1211 Encounter for screening for malignant neoplasm of colon: Secondary | ICD-10-CM | POA: Diagnosis not present

## 2024-03-20 DIAGNOSIS — K648 Other hemorrhoids: Secondary | ICD-10-CM

## 2024-03-20 DIAGNOSIS — I2510 Atherosclerotic heart disease of native coronary artery without angina pectoris: Secondary | ICD-10-CM | POA: Diagnosis not present

## 2024-03-20 DIAGNOSIS — D125 Benign neoplasm of sigmoid colon: Secondary | ICD-10-CM

## 2024-03-20 MED ORDER — ONDANSETRON HCL 4 MG PO TABS: 4.0000 mg | ORAL_TABLET | Freq: Three times a day (TID) | ORAL | 1 refills | Status: DC | PRN

## 2024-03-20 MED ORDER — SODIUM CHLORIDE 0.9 % IV SOLN: 500.0000 mL | INTRAVENOUS | Status: DC

## 2024-03-20 NOTE — Patient Instructions (Signed)

## 2024-03-20 NOTE — Progress Notes (Signed)
 Called to room to assist during endoscopic procedure.  Patient ID and intended procedure confirmed with present staff. Received instructions for my participation in the procedure from the performing physician.

## 2024-03-20 NOTE — Progress Notes (Signed)
 Report given to PACU, vss

## 2024-03-20 NOTE — Op Note (Signed)
 [facility] Patient Name: Jessica [name] Procedure Date: 03/20/2024 7:20 AM MRN: [id number] Endoscopist: Freada Jacobs Lomas , , [id number] Age: 73 Referring MD:  Date of Birth: 23-Oct-1950 Gender: Female Account #: [id number] Procedure:                Colonoscopy Indications:              Screening for colorectal malignant neoplasm Medicines:                Monitored Anesthesia Care Procedure:                Pre-Anesthesia Assessment:                           - Prior to the procedure, a History and Physical                            was performed, and patient medications and                            allergies were reviewed. The patient's tolerance of                            previous anesthesia was also reviewed. The risks                            and benefits of the procedure and the sedation                            options and risks were discussed with the patient.                            All questions were answered, and informed consent                            was obtained. Prior Anticoagulants: The patient has                            taken no anticoagulant or antiplatelet agents                            except for aspirin . ASA Grade Assessment: III - A                            patient with severe systemic disease. After                            reviewing the risks and benefits, the patient was                            deemed in satisfactory condition to undergo the                            procedure.  After obtaining informed consent, the colonoscope                            was passed under direct vision. Throughout the                            procedure, the patient's blood pressure, pulse, and                            oxygen saturations were monitored continuously. The                            CF [id number] #[id number] was introduced through the anus                            with the intention of advancing to  the cecum. The                            scope was advanced to the transverse colon before                            the procedure was aborted. Medications were given.                            The colonoscopy was performed without difficulty.                            The patient tolerated the procedure well. The                            quality of the bowel preparation was poor. The                            rectum was photographed. Scope In: 8:00:49 AM Scope Out: 8:08:54 AM Total Procedure Duration: 0 hours 8 minutes 5 seconds  Findings:                 A large amount of solid stool was found in the                            descending colon and in the transverse colon,                            interfering with visualization.                           A 10 mm polyp was found in the sigmoid colon. The                            polyp was sessile. The polyp was removed with a hot                            snare. Resection and retrieval were complete.  Non-bleeding internal hemorrhoids were found during                            retroflexion. Complications:            No immediate complications. Estimated Blood Loss:     Estimated blood loss was minimal. Impression:               - Preparation of the colon was poor.                           - Stool in the descending colon and in the                            transverse colon.                           - One 10 mm polyp in the sigmoid colon, removed                            with a hot snare. Resected and retrieved.                           - Non-bleeding internal hemorrhoids. Recommendation:           - Discharge patient to home (with escort).                           - Await pathology results.                           - Repeat colonoscopy at the next available                            appointment with two day bowel prep, 3 day clear                            liquids, and 7 days of daily  Linzess before the                            procedure because the bowel preparation was                            suboptimal.                           - The findings and recommendations were discussed                            with the patient. Dr Pedro Bourgeois "[place]" [place],  03/20/2024 8:17:10 AM

## 2024-03-20 NOTE — Progress Notes (Signed)
 GASTROENTEROLOGY PROCEDURE H&P NOTE   Primary Care Physician: Adelia Homestead, MD    Reason for Procedure:   Colon cancer screening  Plan:    Colonoscopy  Patient is appropriate for endoscopic procedure(s) in the ambulatory ([facility]) setting.  The nature of the procedure, as well as the risks, benefits, and alternatives were carefully and thoroughly reviewed with the patient. Ample time for discussion and questions allowed. The patient understood, was satisfied, and agreed to proceed.     HPI: Jessica [name] is a 73 y.o. female who presents for colonoscopy for colon cancer screening. Denies blood in the stools or changes in bowel habits. She has had some unintentional weight loss recently. Denies nausea or dysphagia. Her eating and exercise habits have not changed. She is working with her primary care provider to evaluate her weight loss.  Past Medical History:  Diagnosis Date   Allergy    Breast cancer (HCC) 2000   Cancer (HCC)    breast   Colon polyp    Coronary artery disease    GERD (gastroesophageal reflux disease)    History of blood transfusion    Hx of CABG    3/20   Hyperlipidemia    Migraine    history of migrarines, none as an adult   Personal history of radiation therapy 2000   Rheumatoid arthritis (HCC) 2023    Past Surgical History:  Procedure Laterality Date   ABDOMINAL HYSTERECTOMY     BREAST LUMPECTOMY Right 2000   BREAST SURGERY Right    calcifaction removed   BUNIONECTOMY Bilateral    BUNIONECTOMY Bilateral    COLONOSCOPY     COLONOSCOPY W/ POLYPECTOMY     CORONARY ARTERY BYPASS GRAFT N/A 01/05/2019   Procedure: CORONARY ARTERY BYPASS GRAFTING (CABG), using right leg saphenous endoscopic and open vein harvest, exploration left leg;  Surgeon: Bartley Lightning, MD;  Location: [facility] OR;  Service: Open Heart Surgery;  Laterality: N/A;   EYE SURGERY Bilateral    cataract   hemorrhoid     LEFT HEART CATH AND CORONARY ANGIOGRAPHY N/A  01/01/2019   Procedure: LEFT HEART CATH AND CORONARY ANGIOGRAPHY;  Surgeon: Lucendia Rusk, MD;  Location: [facility] INVASIVE CV LAB;  Service: Cardiovascular;  Laterality: N/A;   STERNAL WIRES REMOVAL N/A 05/11/2019   Procedure: STERNAL WIRES REMOVAL;  Surgeon: Bartley Lightning, MD;  Location: [facility] OR;  Service: Thoracic;  Laterality: N/A;   TEE WITHOUT CARDIOVERSION N/A 01/05/2019   Procedure: TRANSESOPHAGEAL ECHOCARDIOGRAM (TEE);  Surgeon: Bartley Lightning, MD;  Location: [facility] OR;  Service: Open Heart Surgery;  Laterality: N/A;    Prior to Admission medications   Medication Sig Start Date End Date Taking? Authorizing Provider  Alpha-D-Galactosidase (ECK FOOD ENZYME PO) Take by mouth. 1 tablet po every day   Yes [provider]  Ascorbic Acid (VITAMIN C) 1000 MG tablet Take 2,000 mg by mouth daily.   Yes [provider]  aspirin  EC 81 MG tablet Take 1 tablet (81 mg total) by mouth daily. Swallow whole. 05/06/23  Yes Gollan, Timothy J, MD  Cholecalciferol (VITAMIN D -3) 125 MCG (5000 UT) TABS Take 5,000 Units by mouth daily.   Yes [provider]  LINZESS 145 MCG CAPS capsule Take 145 mcg by mouth daily as needed.   Yes [provider]  loratadine (CLARITIN) 10 MG tablet Take 10 mg by mouth every evening.    Yes [provider]  Multiple Vitamin (MULTIVITAMIN WITH MINERALS) TABS tablet Take 1  tablet by mouth daily.   Yes [provider]  Omega-3 Fatty Acids (OMEGA-3 EPA FISH OIL PO) Take by mouth. 1 capsule po every day   Yes [provider]  predniSONE  (DELTASONE ) 10 MG tablet Take 10 mg by mouth as directed. PRN with flareups   Yes [provider]  Magnesium  Gluconate 550 MG TABS     [provider]  RINVOQ 15 MG TB24 Take 15 mg by mouth daily. Patient not taking: Reported on 03/20/2024 01/30/24   [provider]    Current Outpatient Medications  Medication Sig Dispense Refill   Alpha-D-Galactosidase (ECK  FOOD ENZYME PO) Take by mouth. 1 tablet po every day     Ascorbic Acid (VITAMIN C) 1000 MG tablet Take 2,000 mg by mouth daily.     aspirin  EC 81 MG tablet Take 1 tablet (81 mg total) by mouth daily. Swallow whole.     Cholecalciferol (VITAMIN D -3) 125 MCG (5000 UT) TABS Take 5,000 Units by mouth daily.     LINZESS 145 MCG CAPS capsule Take 145 mcg by mouth daily as needed.     loratadine (CLARITIN) 10 MG tablet Take 10 mg by mouth every evening.      Multiple Vitamin (MULTIVITAMIN WITH MINERALS) TABS tablet Take 1 tablet by mouth daily.     Omega-3 Fatty Acids (OMEGA-3 EPA FISH OIL PO) Take by mouth. 1 capsule po every day     predniSONE  (DELTASONE ) 10 MG tablet Take 10 mg by mouth as directed. PRN with flareups     Magnesium  Gluconate 550 MG TABS      RINVOQ 15 MG TB24 Take 15 mg by mouth daily. (Patient not taking: Reported on 03/20/2024)     Current Facility-Administered Medications  Medication Dose Route Frequency Provider Last Rate Last Admin   0.9 %  sodium chloride  infusion  500 mL Intravenous Continuous Daina Drum, MD        Allergies as of 03/20/2024 - Review Complete 03/20/2024  Allergen Reaction Noted   Codeine Nausea And Vomiting 04/01/2015   Evolocumab  Other (See Comments) 09/21/2020   Sulfa antibiotics Swelling 04/01/2015   Sulfacetamide Swelling 08/13/2016    Family History  Problem Relation Age of Onset   Heart attack Father 64   Heart disease Brother    Asthma Paternal Aunt    Diabetes Paternal Aunt    Stroke Paternal Uncle    Kidney disease Paternal Uncle    Colon cancer Neg Hx    Esophageal cancer Neg Hx    Rectal cancer Neg Hx    Stomach cancer Neg Hx    Colon polyps Neg Hx     Social History   Socioeconomic History   Marital status: Married    Spouse name: Not on file   Number of children: Not on file   Years of education: Not on file   Highest education level: Master's degree (e.g., MA, MS, MEng, MEd, MSW, MBA)  Occupational History   Not on  file  Tobacco Use   Smoking status: Never   Smokeless tobacco: Never  Vaping Use   Vaping status: Never Used  Substance and Sexual Activity   Alcohol use: No   Drug use: No   Sexual activity: Yes  Other Topics Concern   Not on file  Social History Narrative   Not on file   Social Drivers of Health   Financial Resource Strain: Low Risk  (12/01/2023)   Received from [facility]   Overall Financial  Resource Strain (CARDIA)    Difficulty of Paying Living Expenses: Not hard at all  Food Insecurity: No Food Insecurity (12/01/2023)   Received from [facility]   Hunger Vital Sign    Worried About Running Out of Food in the Last Year: Never true    Ran Out of Food in the Last Year: Never true  Transportation Needs: No Transportation Needs (12/01/2023)   Received from [facility] - Transportation    Lack of Transportation (Medical): No    Lack of Transportation (Non-Medical): No  Physical Activity: Insufficiently Active (12/01/2023)   Received from [facility]   Exercise Vital Sign    Days of Exercise per Week: 3 days    Minutes of Exercise per Session: 40 min  Stress: No Stress Concern Present (12/01/2023)   Received from [facility] - Occupational Stress Questionnaire    Feeling of Stress : Only a little  Social Connections: Socially Integrated (12/01/2023)   Received from [facility]   Social Network    How would you rate your social network (family, work, friends)?: Good participation with social networks  Intimate Partner Violence: Not At Risk (12/01/2023)   Received from [facility]   HITS    Over the last 12 months how often did your partner physically hurt you?: Never    Over the last 12 months how often did your partner insult you or talk down to you?: Never    Over the last 12 months how often did your partner threaten you with physical harm?: Never    Over the last 12 months how often did your partner scream or  curse at you?: Never    Physical Exam: Vital signs in last 24 hours: BP (!) 151/68   Pulse 62   Resp 17   SpO2 100%  GEN: NAD EYE: Sclerae anicteric ENT: MMM CV: Non-tachycardic Pulm: No increased work of breathing GI: Soft, NT/ND NEURO:  Alert & Oriented   Regino Caprio, MD [facility]  03/20/2024 7:58 AM

## 2024-03-23 ENCOUNTER — Telehealth: Payer: Self-pay | Admitting: Lactation Services

## 2024-03-23 NOTE — Telephone Encounter (Signed)
  Follow up Call-     03/20/2024    7:13 AM  Call back number  Post procedure Call Back phone  # [phone]  Permission to leave phone message Yes     Patient questions:  Do you have a fever, pain , or abdominal swelling? No. Pain Score  0 *  Have you tolerated food without any problems? Yes.    Have you been able to return to your normal activities? Yes.    Do you have any questions about your discharge instructions: Diet   No. Medications  No. Follow up visit  No.  Do you have questions or concerns about your Care? No.  Actions: * If pain score is 4 or above: No action needed, pain <4.

## 2024-03-24 ENCOUNTER — Ambulatory Visit (AMBULATORY_SURGERY_CENTER)

## 2024-03-24 VITALS — Ht 66.0 in | Wt 154.0 lb

## 2024-03-24 DIAGNOSIS — Z1211 Encounter for screening for malignant neoplasm of colon: Secondary | ICD-10-CM

## 2024-03-24 MED ORDER — NA SULFATE-K SULFATE-MG SULF 17.5-3.13-1.6 GM/177ML PO SOLN: 1.0000 | Freq: Once | ORAL | 0 refills | Status: AC

## 2024-03-24 NOTE — Progress Notes (Signed)
 No egg or soy allergy known to patient  No issues known to pt with past sedation with any surgeries or procedures Patient denies ever being told they had issues or difficulty with intubation  No FH of Malignant Hyperthermia Pt is not on diet pills Pt is not on  home 02  Pt is not on blood thinners  Pt denies issues with constipation  No A fib or A flutter Have any cardiac testing pending--no LOA: independent  Prep: 2 day suprep   Patient's chart reviewed by Cathlyn Parsons CNRA prior to previsit and patient appropriate for the [facility].  Previsit completed and red dot placed by patient's name on their procedure day (on provider's schedule).     PV completed with patient. Prep instructions sent via mychart and home address.

## 2024-03-25 ENCOUNTER — Encounter: Payer: Self-pay | Admitting: Internal Medicine

## 2024-03-25 ENCOUNTER — Ambulatory Visit: Payer: Self-pay | Admitting: Internal Medicine

## 2024-03-25 DIAGNOSIS — H524 Presbyopia: Secondary | ICD-10-CM | POA: Diagnosis not present

## 2024-03-25 DIAGNOSIS — H16223 Keratoconjunctivitis sicca, not specified as Sjogren's, bilateral: Secondary | ICD-10-CM | POA: Diagnosis not present

## 2024-03-25 DIAGNOSIS — I10 Essential (primary) hypertension: Secondary | ICD-10-CM | POA: Diagnosis not present

## 2024-03-25 DIAGNOSIS — H02842 Edema of right lower eyelid: Secondary | ICD-10-CM | POA: Diagnosis not present

## 2024-03-25 DIAGNOSIS — H26493 Other secondary cataract, bilateral: Secondary | ICD-10-CM | POA: Diagnosis not present

## 2024-03-25 DIAGNOSIS — H35033 Hypertensive retinopathy, bilateral: Secondary | ICD-10-CM | POA: Diagnosis not present

## 2024-03-25 DIAGNOSIS — H02845 Edema of left lower eyelid: Secondary | ICD-10-CM | POA: Diagnosis not present

## 2024-03-25 DIAGNOSIS — H04213 Epiphora due to excess lacrimation, bilateral lacrimal glands: Secondary | ICD-10-CM | POA: Diagnosis not present

## 2024-03-25 LAB — SURGICAL PATHOLOGY

## 2024-03-30 ENCOUNTER — Encounter: Payer: Self-pay | Admitting: Podiatry

## 2024-03-30 ENCOUNTER — Ambulatory Visit (INDEPENDENT_AMBULATORY_CARE_PROVIDER_SITE_OTHER)

## 2024-03-30 ENCOUNTER — Ambulatory Visit: Admitting: Podiatry

## 2024-03-30 DIAGNOSIS — M19071 Primary osteoarthritis, right ankle and foot: Secondary | ICD-10-CM | POA: Diagnosis not present

## 2024-03-30 DIAGNOSIS — M7741 Metatarsalgia, right foot: Secondary | ICD-10-CM | POA: Diagnosis not present

## 2024-03-30 DIAGNOSIS — M19072 Primary osteoarthritis, left ankle and foot: Secondary | ICD-10-CM

## 2024-03-30 DIAGNOSIS — M7742 Metatarsalgia, left foot: Secondary | ICD-10-CM | POA: Diagnosis not present

## 2024-03-30 NOTE — Patient Instructions (Signed)

## 2024-03-31 NOTE — Progress Notes (Signed)
  Subjective:  Patient ID: Jessica [name], female    DOB: 03/28/1951,  MRN: [id number]  Chief Complaint  Patient presents with   Foot Pain    "I'm having some swelling and pain."        History of Present Illness   [name] is a 73 year old female with rheumatoid arthritis who presents with right foot pain.  She returns for follow-up the injections have not helped         Objective:    Physical Exam   CARDIOVASCULAR: Good capillary fill time. MUSCULOSKELETAL: Mild hallux valgus deformity with callous plantar medial bilateral, nontender. Pain on palpation to the dorsal and medial navicular cuneiform joint deep to the TA tendon.  Pain in the dorsal midfoot on the second TMT as well      No images are attached to the encounter.     RADIOLOGY Left foot and ankle and right foot and ankle radiographs: Diffuse midfoot arthritis in the second TMT and navicular cuneiform joints, moderate in nature, with plantar osteoarthritis and capsulitis of the midfoot.      Assessment:   Encounter Diagnoses  Name Primary?   Arthritis of midtarsal joint of left foot Yes   Arthritis of midtarsal joint of right foot    Metatarsalgia of both feet        Plan:  Patient was evaluated and treated and all questions answered.  Assessment and Plan    Foot Pain Has not improved despite support and injections and home physical therapy.  She had a separate injection with Dr. Luster Salters which did not help either.  We discussed surgical treatment options.  I recommended MRI prior to this to help determine our surgical plan of action.  MRI has been ordered for both feet.  Follow-up with me after the study to reevaluate and surgical consultation. Return if symptoms worsen or fail to improve.

## 2024-04-02 ENCOUNTER — Ambulatory Visit: Admitting: Internal Medicine

## 2024-04-02 ENCOUNTER — Encounter: Payer: Self-pay | Admitting: Internal Medicine

## 2024-04-02 VITALS — BP 146/58 | HR 50 | Temp 97.7°F | Resp 12 | Ht 66.0 in | Wt 154.0 lb

## 2024-04-02 DIAGNOSIS — K648 Other hemorrhoids: Secondary | ICD-10-CM

## 2024-04-02 DIAGNOSIS — Z1211 Encounter for screening for malignant neoplasm of colon: Secondary | ICD-10-CM | POA: Diagnosis not present

## 2024-04-02 DIAGNOSIS — I2510 Atherosclerotic heart disease of native coronary artery without angina pectoris: Secondary | ICD-10-CM | POA: Diagnosis not present

## 2024-04-02 DIAGNOSIS — D122 Benign neoplasm of ascending colon: Secondary | ICD-10-CM

## 2024-04-02 DIAGNOSIS — M069 Rheumatoid arthritis, unspecified: Secondary | ICD-10-CM | POA: Diagnosis not present

## 2024-04-02 DIAGNOSIS — D123 Benign neoplasm of transverse colon: Secondary | ICD-10-CM | POA: Diagnosis not present

## 2024-04-02 DIAGNOSIS — E785 Hyperlipidemia, unspecified: Secondary | ICD-10-CM | POA: Diagnosis not present

## 2024-04-02 MED ORDER — SODIUM CHLORIDE 0.9 % IV SOLN: 500.0000 mL | Freq: Once | INTRAVENOUS | Status: DC

## 2024-04-02 NOTE — Patient Instructions (Signed)
 Resume previous diet Await pathology results Handout on polyps given Attempted to schedule a 2 month follow up visit with Dr Rosaline Coma; no appointments available at this time.  Patient will call office to schedule.   YOU HAD AN ENDOSCOPIC PROCEDURE TODAY AT THE [facility]:   Refer to the procedure report that was given to you for any specific questions about what was found during the examination.  If the procedure report does not answer your questions, please call your gastroenterologist to clarify.  If you requested that your care partner not be given the details of your procedure findings, then the procedure report has been included in a sealed envelope for you to review at your convenience later.  YOU SHOULD EXPECT: Some feelings of bloating in the abdomen. Passage of more gas than usual.  Walking can help get rid of the air that was put into your GI tract during the procedure and reduce the bloating. If you had a lower endoscopy (such as a colonoscopy or flexible sigmoidoscopy) you may notice spotting of blood in your stool or on the toilet paper. If you underwent a bowel prep for your procedure, you may not have a normal bowel movement for a few days.  Please Note:  You might notice some irritation and congestion in your nose or some drainage.  This is from the oxygen used during your procedure.  There is no need for concern and it should clear up in a day or so.  SYMPTOMS TO REPORT IMMEDIATELY:  Following lower endoscopy (colonoscopy or flexible sigmoidoscopy):  Excessive amounts of blood in the stool  Significant tenderness or worsening of abdominal pains  Swelling of the abdomen that is new, acute  Fever of 100F or higher  For urgent or emergent issues, a gastroenterologist can be reached at any hour by calling [phone]. Do not use MyChart messaging for urgent concerns.    DIET:  We do recommend a small meal at first, but then you may proceed to your regular diet.   Drink plenty of fluids but you should avoid alcoholic beverages for 24 hours.  ACTIVITY:  You should plan to take it easy for the rest of today and you should NOT DRIVE or use heavy machinery until tomorrow (because of the sedation medicines used during the test).    FOLLOW UP: Our staff will call the number listed on your records the next business day following your procedure.  We will call around 7:15- 8:00 am to check on you and address any questions or concerns that you may have regarding the information given to you following your procedure. If we do not reach you, we will leave a message.     If any biopsies were taken you will be contacted by phone or by letter within the next 1-3 weeks.  Please call us  at [phone] if you have not heard about the biopsies in 3 weeks.    SIGNATURES/CONFIDENTIALITY: You and/or your care partner have signed paperwork which will be entered into your electronic medical record.  These signatures attest to the fact that that the information above on your After Visit Summary has been reviewed and is understood.  Full responsibility of the confidentiality of this discharge information lies with you and/or your care-partner.

## 2024-04-02 NOTE — Progress Notes (Signed)
 GASTROENTEROLOGY PROCEDURE H&P NOTE   Primary Care Physician: Adelia Homestead, MD    Reason for Procedure:   Colon cancer screening, poor prep on last colonoscopy  Plan:    Colonoscopy  Patient is appropriate for endoscopic procedure(s) in the ambulatory ([facility]) setting.  The nature of the procedure, as well as the risks, benefits, and alternatives were carefully and thoroughly reviewed with the patient. Ample time for discussion and questions allowed. The patient understood, was satisfied, and agreed to proceed.     HPI: Jessica [name] is a 73 y.o. female who presents for colonoscopy for colon cancer screening. She had a poor prep on her last colonoscopy in 03/20/24  Past Medical History:  Diagnosis Date   Allergy    Breast cancer (HCC) 2000   Cancer (HCC)    breast   Colon polyp    Coronary artery disease    GERD (gastroesophageal reflux disease)    History of blood transfusion    Hx of CABG    3/20   Hyperlipidemia    Migraine    history of migrarines, none as an adult   Personal history of radiation therapy 2000   Rheumatoid arthritis (HCC) 2023    Past Surgical History:  Procedure Laterality Date   ABDOMINAL HYSTERECTOMY     BREAST LUMPECTOMY Right 2000   BREAST SURGERY Right    calcifaction removed   BUNIONECTOMY Bilateral    BUNIONECTOMY Bilateral    COLONOSCOPY     COLONOSCOPY W/ POLYPECTOMY     CORONARY ARTERY BYPASS GRAFT N/A 01/05/2019   Procedure: CORONARY ARTERY BYPASS GRAFTING (CABG), using right leg saphenous endoscopic and open vein harvest, exploration left leg;  Surgeon: Bartley Lightning, MD;  Location: [facility] OR;  Service: Open Heart Surgery;  Laterality: N/A;   EYE SURGERY Bilateral    cataract   hemorrhoid     LEFT HEART CATH AND CORONARY ANGIOGRAPHY N/A 01/01/2019   Procedure: LEFT HEART CATH AND CORONARY ANGIOGRAPHY;  Surgeon: Lucendia Rusk, MD;  Location: [facility] INVASIVE CV LAB;  Service: Cardiovascular;  Laterality: N/A;    STERNAL WIRES REMOVAL N/A 05/11/2019   Procedure: STERNAL WIRES REMOVAL;  Surgeon: Bartley Lightning, MD;  Location: [facility] OR;  Service: Thoracic;  Laterality: N/A;   TEE WITHOUT CARDIOVERSION N/A 01/05/2019   Procedure: TRANSESOPHAGEAL ECHOCARDIOGRAM (TEE);  Surgeon: Bartley Lightning, MD;  Location: [facility] OR;  Service: Open Heart Surgery;  Laterality: N/A;    Prior to Admission medications   Medication Sig Start Date End Date Taking? Authorizing Provider  Alpha-D-Galactosidase (ECK FOOD ENZYME PO) Take by mouth. 1 tablet po every day   Yes [provider]  Ascorbic Acid (VITAMIN C) 1000 MG tablet Take 2,000 mg by mouth daily.   Yes [provider]  aspirin  EC 81 MG tablet Take 1 tablet (81 mg total) by mouth daily. Swallow whole. 05/06/23  Yes Gollan, Timothy J, MD  Cholecalciferol (VITAMIN D -3) 125 MCG (5000 UT) TABS Take 5,000 Units by mouth daily.   Yes [provider]  LINZESS 145 MCG CAPS capsule Take 145 mcg by mouth daily as needed.   Yes [provider]  loratadine (CLARITIN) 10 MG tablet Take 10 mg by mouth every evening.    Yes [provider]  Magnesium  Gluconate 550 MG TABS    Yes [provider]  methylPREDNISolone  (MEDROL  DOSEPAK) 4 MG TBPK tablet SMARTSIG:- Tablet(s) By Mouth - 03/25/24  Yes [provider]  Multiple Vitamin (MULTIVITAMIN WITH  MINERALS) TABS tablet Take 1 tablet by mouth daily.   Yes [provider]  neomycin-polymyxin b-dexamethasone  (MAXITROL) 3.5-10000-0.1 OINT Place 1 Application into both eyes. 03/25/24  Yes [provider]  Omega-3 Fatty Acids (OMEGA-3 EPA FISH OIL PO) Take by mouth. 1 capsule po every day   Yes [provider]  ondansetron  (ZOFRAN ) 4 MG tablet Take 1 tablet (4 mg total) by mouth every 8 (eight) hours as needed for nausea or vomiting. 03/20/24  Yes Daina Drum, MD  predniSONE  (DELTASONE ) 10 MG tablet Take 10 mg by mouth as directed. PRN with flareups    [provider]  RINVOQ 15 MG TB24 Take 15 mg by mouth daily. Patient not taking: Reported on 04/02/2024 01/30/24   [provider]    Current Outpatient Medications  Medication Sig Dispense Refill   Alpha-D-Galactosidase (ECK FOOD ENZYME PO) Take by mouth. 1 tablet po every day     Ascorbic Acid (VITAMIN C) 1000 MG tablet Take 2,000 mg by mouth daily.     aspirin  EC 81 MG tablet Take 1 tablet (81 mg total) by mouth daily. Swallow whole.     Cholecalciferol (VITAMIN D -3) 125 MCG (5000 UT) TABS Take 5,000 Units by mouth daily.     LINZESS 145 MCG CAPS capsule Take 145 mcg by mouth daily as needed.     loratadine (CLARITIN) 10 MG tablet Take 10 mg by mouth every evening.      Magnesium  Gluconate 550 MG TABS      methylPREDNISolone  (MEDROL  DOSEPAK) 4 MG TBPK tablet SMARTSIG:- Tablet(s) By Mouth -     Multiple Vitamin (MULTIVITAMIN WITH MINERALS) TABS tablet Take 1 tablet by mouth daily.     neomycin-polymyxin b-dexamethasone  (MAXITROL) 3.5-10000-0.1 OINT Place 1 Application into both eyes.     Omega-3 Fatty Acids (OMEGA-3 EPA FISH OIL PO) Take by mouth. 1 capsule po every day     ondansetron  (ZOFRAN ) 4 MG tablet Take 1 tablet (4 mg total) by mouth every 8 (eight) hours as needed for nausea or vomiting. 30 tablet 1   predniSONE  (DELTASONE ) 10 MG tablet Take 10 mg by mouth as directed. PRN with flareups     RINVOQ 15 MG TB24 Take 15 mg by mouth daily. (Patient not taking: Reported on 04/02/2024)     Current Facility-Administered Medications  Medication Dose Route Frequency Provider Last Rate Last Admin   0.9 %  sodium chloride  infusion  500 mL Intravenous Once Daina Drum, MD        Allergies as of 04/02/2024 - Review Complete 04/02/2024  Allergen Reaction Noted   Codeine Nausea And Vomiting 04/01/2015   Evolocumab  Other (See Comments) 09/21/2020   Sulfa antibiotics Swelling 04/01/2015   Sulfacetamide Swelling 08/13/2016    Family History  Problem Relation Age of Onset   Heart  attack Father 63   Heart disease Brother    Asthma Paternal Aunt    Diabetes Paternal Aunt    Stroke Paternal Uncle    Kidney disease Paternal Uncle    Colon cancer Neg Hx    Esophageal cancer Neg Hx    Rectal cancer Neg Hx    Stomach cancer Neg Hx    Colon polyps Neg Hx     Social History   Socioeconomic History   Marital status: Married    Spouse name: Not on file   Number of children: Not on file   Years of education: Not on file   Highest education level: Master's degree (  e.g., MA, MS, MEng, MEd, MSW, MBA)  Occupational History   Not on file  Tobacco Use   Smoking status: Never   Smokeless tobacco: Never  Vaping Use   Vaping status: Never Used  Substance and Sexual Activity   Alcohol use: No   Drug use: No   Sexual activity: Yes  Other Topics Concern   Not on file  Social History Narrative   Not on file   Social Drivers of Health   Financial Resource Strain: Low Risk  (12/01/2023)   Received from [facility]   Overall Financial Resource Strain (CARDIA)    Difficulty of Paying Living Expenses: Not hard at all  Food Insecurity: No Food Insecurity (12/01/2023)   Received from [facility]   Hunger Vital Sign    Worried About Running Out of Food in the Last Year: Never true    Ran Out of Food in the Last Year: Never true  Transportation Needs: No Transportation Needs (12/01/2023)   Received from [facility] - Transportation    Lack of Transportation (Medical): No    Lack of Transportation (Non-Medical): No  Physical Activity: Insufficiently Active (12/01/2023)   Received from [facility]   Exercise Vital Sign    Days of Exercise per Week: 3 days    Minutes of Exercise per Session: 40 min  Stress: No Stress Concern Present (12/01/2023)   Received from [facility] - Occupational Stress Questionnaire    Feeling of Stress : Only a little  Social Connections: Socially Integrated (12/01/2023)   Received from  [facility]   Social Network    How would you rate your social network (family, work, friends)?: Good participation with social networks  Intimate Partner Violence: Not At Risk (12/01/2023)   Received from [facility]   HITS    Over the last 12 months how often did your partner physically hurt you?: Never    Over the last 12 months how often did your partner insult you or talk down to you?: Never    Over the last 12 months how often did your partner threaten you with physical harm?: Never    Over the last 12 months how often did your partner scream or curse at you?: Never    Physical Exam: Vital signs in last 24 hours: BP 129/69   Pulse 62   Temp 97.7 F (36.5 C) (Temporal)   Ht 5' 6 (1.676 m)   Wt 154 lb (69.9 kg)   SpO2 100%   BMI 24.86 kg/m  GEN: NAD EYE: Sclerae anicteric ENT: MMM CV: Non-tachycardic Pulm: No increased work of breathing GI: Soft, NT/ND NEURO:  Alert & Oriented   Regino Caprio, MD [facility]  04/02/2024 8:40 AM

## 2024-04-02 NOTE — Progress Notes (Signed)
 Repeat colonoscopy due to poor prep on 03/20/2024.  Pt's states no medical or surgical changes since previsit or office visit.

## 2024-04-02 NOTE — Progress Notes (Signed)
 Called to room to assist during endoscopic procedure.  Patient ID and intended procedure confirmed with present staff. Received instructions for my participation in the procedure from the performing physician.

## 2024-04-02 NOTE — Op Note (Addendum)
 [name] Patient Name: Jessica [name] Procedure Date: 04/02/2024 8:49 AM MRN: [id number] Endoscopist: Pedro Bourgeois , , [id number] Age: 73 Referring MD:  Date of Birth: 30-Mar-1951 Gender: Female Account #: [id number] Procedure:                Colonoscopy Indications:              Screening for colorectal malignant neoplasm Medicines:                Monitored Anesthesia Care Procedure:                Pre-Anesthesia Assessment:                           - Prior to the procedure, a History and Physical                            was performed, and patient medications and                            allergies were reviewed. The patient's tolerance of                            previous anesthesia was also reviewed. The risks                            and benefits of the procedure and the sedation                            options and risks were discussed with the patient.                            All questions were answered, and informed consent                            was obtained. Prior Anticoagulants: The patient has                            taken no anticoagulant or antiplatelet agents. ASA                            Grade Assessment: II - A patient with mild systemic                            disease. After reviewing the risks and benefits,                            the patient was deemed in satisfactory condition to                            undergo the procedure.                           After obtaining informed consent, the colonoscope  was passed under direct vision. Throughout the                            procedure, the patient's blood pressure, pulse, and                            oxygen saturations were monitored continuously. The                            Olympus Scope SN: [id number] was introduced through                            the anus and advanced to the the terminal ileum.                            The  colonoscopy was performed without difficulty.                            The patient tolerated the procedure well. The                            quality of the bowel preparation was excellent. The                            terminal ileum, ileocecal valve, appendiceal                            orifice, and rectum were photographed. Scope In: 8:53:31 AM Scope Out: 9:11:10 AM Scope Withdrawal Time: 0 hours 12 minutes 24 seconds  Total Procedure Duration: 0 hours 17 minutes 39 seconds  Findings:                 The terminal ileum appeared normal.                           Five sessile polyps were found in the transverse                            colon and ascending colon. The polyps were 3 to 7                            mm in size. These polyps were removed with a cold                            snare. Resection and retrieval were complete.                           Non-bleeding internal hemorrhoids were found during                            retroflexion. Complications:            No immediate complications. Estimated Blood Loss:     Estimated blood loss was minimal. Impression:               -  The examined portion of the ileum was normal.                           - Five 3 to 7 mm polyps in the transverse colon and                            in the ascending colon, removed with a cold snare.                            Resected and retrieved.                           - Non-bleeding internal hemorrhoids. Recommendation:           - Discharge patient to home (with escort).                           - Await pathology results.                           - Return to GI clinic in 2-3 months to follow up                            constipation.                           - The findings and recommendations were discussed                            with the patient. Dr Apolonio Kluver,  04/02/2024 9:14:28 AM

## 2024-04-02 NOTE — Addendum Note (Signed)
 Addended by: Vernel Golds R on: 04/02/2024 10:10 AM   Modules accepted: Orders

## 2024-04-03 ENCOUNTER — Telehealth: Payer: Self-pay | Admitting: *Deleted

## 2024-04-03 NOTE — Telephone Encounter (Signed)
  Follow up Call-     04/02/2024    8:02 AM 03/20/2024    7:13 AM  Call back number  Post procedure Call Back phone  # [phone]  Permission to leave phone message Yes Yes     Patient questions:  Do you have a fever, pain , or abdominal swelling? No. Pain Score  0 *  Have you tolerated food without any problems? Yes.    Have you been able to return to your normal activities? Yes.    Do you have any questions about your discharge instructions: Diet   No. Medications  No. Follow up visit  No.  Do you have questions or concerns about your Care? No.  Actions: * If pain score is 4 or above: No action needed, pain <4.

## 2024-04-06 ENCOUNTER — Ambulatory Visit: Payer: Self-pay | Admitting: Internal Medicine

## 2024-04-06 LAB — SURGICAL PATHOLOGY

## 2024-04-09 ENCOUNTER — Ambulatory Visit: Payer: Medicare PPO

## 2024-04-09 VITALS — Ht 66.0 in | Wt 154.0 lb

## 2024-04-09 DIAGNOSIS — Z0000 Encounter for general adult medical examination without abnormal findings: Secondary | ICD-10-CM

## 2024-04-09 NOTE — Patient Instructions (Signed)
 Jessica [name] , Thank you for taking time out of your busy schedule to complete your Annual Wellness Visit with me. I enjoyed our conversation and look forward to speaking with you again next year. I, as well as your care team,  appreciate your ongoing commitment to your health goals. Please review the following plan we discussed and let me know if I can assist you in the future. Your Game plan/ To Do List    Follow up Visits: Next Medicare AWV with our clinical staff: 04/15/2025.   Have you seen your provider in the last 6 months (3 months if uncontrolled diabetes)? Yes Next Office Visit with your provider: Patient will call to schedule her yearly.  Her last appointment was 01/28/2024.  Clinician Recommendations:  Aim for 30 minutes of exercise or brisk walking, 6-8 glasses of water, and 5 servings of fruits and vegetables each day. Keep up the good work.      This is a list of the screening recommended for you and due dates:  Health Maintenance  Topic Date Due   COVID-19 Vaccine (4 - 2024-25 season) 06/23/2023   Flu Shot  05/22/2024   Medicare Annual Wellness Visit  04/09/2025   Mammogram  12/08/2025   DTaP/Tdap/Td vaccine (4 - Td or Tdap) 10/02/2026   Colon Cancer Screening  04/03/2027   Pneumococcal Vaccine for age over 68  Completed   DEXA scan (bone density measurement)  Completed   Hepatitis C Screening  Completed   Zoster (Shingles) Vaccine  Completed   HPV Vaccine  Aged Out   Meningitis B Vaccine  Aged Out    Advanced directives: (Copy Requested) Please bring a copy of your health care power of attorney and living will to the office to be added to your chart at your convenience. You can mail to [facility] [address] [place], [place] [address] or email to [email or web address] Advance Care Planning is important because it:  [x]  Makes sure you receive the medical care that is consistent with your values, goals, and preferences  [x]  It provides guidance to  your family and loved ones and reduces their decisional burden about whether or not they are making the right decisions based on your wishes.  Follow the link provided in your after visit summary or read over the paperwork we have mailed to you to help you started getting your Advance Directives in place. If you need assistance in completing these, please reach out to us  so that we can help you!  See attachments for Preventive Care and Fall Prevention Tips.

## 2024-04-09 NOTE — Progress Notes (Signed)
 Subjective:   Jessica [name] is a 73 y.o. who presents for a Medicare Wellness preventive visit.  As a reminder, Annual Wellness Visits don't include a physical exam, and some assessments may be limited, especially if this visit is performed virtually. We may recommend an in-person follow-up visit with your provider if needed.  Visit Complete: Virtual I connected with  Jessica [name] on 04/09/24 by a audio enabled telemedicine application and verified that I am speaking with the correct person using two identifiers.  Patient Location: Home  Provider Location: Home Office  I discussed the limitations of evaluation and management by telemedicine. The patient expressed understanding and agreed to proceed.  Vital Signs: Because this visit was a virtual/telehealth visit, some criteria may be missing or patient reported. Any vitals not documented were not able to be obtained and vitals that have been documented are patient reported.  VideoDeclined- This patient declined Librarian, academic. Therefore the visit was completed with audio only.  Persons Participating in Visit: Patient.  AWV Questionnaire: Yes: Patient Medicare AWV questionnaire was completed by the patient on 04/08/2024; I have confirmed that all information answered by patient is correct and no changes since this date.  Cardiac Risk Factors include: advanced age (>7men, >26 women);hypertension;dyslipidemia;Other (see comment)     Objective:    Today's Vitals   04/09/24 1138  Weight: 154 lb (69.9 kg)  Height: 5' 6 (1.676 m)   Body mass index is 24.86 kg/m.     04/09/2024   11:45 AM 10/10/2023   12:35 PM 05/04/2023   12:58 PM 04/04/2023    1:10 PM 03/12/2022    2:35 PM 11/29/2021    7:22 AM 10/03/2021    8:25 AM  Advanced Directives  Does Patient Have a Medical Advance Directive? Yes No Yes Yes No No Yes  Type of Estate agent of [place];Living will   Living will Healthcare Power of [place];Living will   Healthcare Power of [place];Living will  Does patient want to make changes to medical advance directive?       No - Patient declined  Copy of Healthcare Power of Attorney in Chart? No - copy requested   No - copy requested   No - copy requested  Would patient like information on creating a medical advance directive?     No - Patient declined      Current Medications (verified) Outpatient Encounter Medications as of 04/09/2024  Medication Sig   Alpha-D-Galactosidase (ECK FOOD ENZYME PO) Take by mouth. 1 tablet po every day   Ascorbic Acid (VITAMIN C) 1000 MG tablet Take 2,000 mg by mouth daily.   aspirin  EC 81 MG tablet Take 1 tablet (81 mg total) by mouth daily. Swallow whole.   Cholecalciferol (VITAMIN D -3) 125 MCG (5000 UT) TABS Take 5,000 Units by mouth daily.   LINZESS 145 MCG CAPS capsule Take 145 mcg by mouth daily as needed.   loratadine (CLARITIN) 10 MG tablet Take 10 mg by mouth every evening.    Magnesium  Gluconate 550 MG TABS    Multiple Vitamin (MULTIVITAMIN WITH MINERALS) TABS tablet Take 1 tablet by mouth daily.   Omega-3 Fatty Acids (OMEGA-3 EPA FISH OIL PO) Take by mouth. 1 capsule po every day   ondansetron  (ZOFRAN ) 4 MG tablet Take 1 tablet (4 mg total) by mouth every 8 (eight) hours as needed for nausea or vomiting.   predniSONE  (DELTASONE ) 10 MG tablet Take 10 mg by mouth as directed. PRN with  flareups   methylPREDNISolone  (MEDROL  DOSEPAK) 4 MG TBPK tablet SMARTSIG:- Tablet(s) By Mouth - (Patient not taking: Reported on 04/09/2024)   neomycin-polymyxin b-dexamethasone  (MAXITROL) 3.5-10000-0.1 OINT Place 1 Application into both eyes. (Patient not taking: Reported on 04/09/2024)   RINVOQ 15 MG TB24 Take 15 mg by mouth daily. (Patient not taking: Reported on 04/09/2024)   No facility-administered encounter medications on file as of 04/09/2024.    Allergies (verified) Codeine, Evolocumab , Sulfa antibiotics, and  Sulfacetamide   History: Past Medical History:  Diagnosis Date   Allergy    Breast cancer (HCC) 2000   Cancer (HCC)    breast   Colon polyp    Coronary artery disease    GERD (gastroesophageal reflux disease)    History of blood transfusion    Hx of CABG    3/20   Hyperlipidemia    Migraine    history of migrarines, none as an adult   Personal history of radiation therapy 2000   Rheumatoid arthritis (HCC) 2023   Past Surgical History:  Procedure Laterality Date   ABDOMINAL HYSTERECTOMY     BREAST LUMPECTOMY Right 2000   BREAST SURGERY Right    calcifaction removed   BUNIONECTOMY Bilateral    BUNIONECTOMY Bilateral    COLONOSCOPY     COLONOSCOPY W/ POLYPECTOMY     CORONARY ARTERY BYPASS GRAFT N/A 01/05/2019   Procedure: CORONARY ARTERY BYPASS GRAFTING (CABG), using right leg saphenous endoscopic and open vein harvest, exploration left leg;  Surgeon: Bartley Lightning, MD;  Location: [facility] OR;  Service: Open Heart Surgery;  Laterality: N/A;   EYE SURGERY Bilateral    cataract   hemorrhoid     LEFT HEART CATH AND CORONARY ANGIOGRAPHY N/A 01/01/2019   Procedure: LEFT HEART CATH AND CORONARY ANGIOGRAPHY;  Surgeon: Lucendia Rusk, MD;  Location: [facility] INVASIVE CV LAB;  Service: Cardiovascular;  Laterality: N/A;   STERNAL WIRES REMOVAL N/A 05/11/2019   Procedure: STERNAL WIRES REMOVAL;  Surgeon: Bartley Lightning, MD;  Location: [facility] OR;  Service: Thoracic;  Laterality: N/A;   TEE WITHOUT CARDIOVERSION N/A 01/05/2019   Procedure: TRANSESOPHAGEAL ECHOCARDIOGRAM (TEE);  Surgeon: Bartley Lightning, MD;  Location: [facility] OR;  Service: Open Heart Surgery;  Laterality: N/A;   Family History  Problem Relation Age of Onset   Heart attack Father 53   Heart disease Brother    Asthma Paternal Aunt    Diabetes Paternal Aunt    Stroke Paternal Uncle    Kidney disease Paternal Uncle    Colon cancer Neg Hx    Esophageal cancer Neg Hx    Rectal cancer Neg Hx    Stomach cancer Neg Hx    Colon  polyps Neg Hx    Social History   Socioeconomic History   Marital status: Married    Spouse name: Regan Cao   Number of children: Not on file   Years of education: Not on file   Highest education level: Master's degree (e.g., MA, MS, MEng, MEd, MSW, MBA)  Occupational History   Occupation: RETIRED  Tobacco Use   Smoking status: Never   Smokeless tobacco: Never  Vaping Use   Vaping status: Never Used  Substance and Sexual Activity   Alcohol use: No   Drug use: No   Sexual activity: Yes  Other Topics Concern   Not on file  Social History Narrative   Lives with husband/2025   Social Drivers of Health   Financial Resource Strain: Low Risk  (04/08/2024)   Overall  Financial Resource Strain (CARDIA)    Difficulty of Paying Living Expenses: Not hard at all  Food Insecurity: No Food Insecurity (04/08/2024)   Hunger Vital Sign    Worried About Running Out of Food in the Last Year: Never true    Ran Out of Food in the Last Year: Never true  Transportation Needs: No Transportation Needs (04/08/2024)   PRAPARE - Administrator, Civil Service (Medical): No    Lack of Transportation (Non-Medical): No  Physical Activity: Insufficiently Active (04/08/2024)   Exercise Vital Sign    Days of Exercise per Week: 3 days    Minutes of Exercise per Session: 30 min  Stress: No Stress Concern Present (04/08/2024)   [employer] of Occupational Health - Occupational Stress Questionnaire    Feeling of Stress: Not at all  Social Connections: Socially Integrated (04/08/2024)   Social Connection and Isolation Panel    Frequency of Communication with Friends and Family: Twice a week    Frequency of Social Gatherings with Friends and Family: Once a week    Attends Religious Services: More than 4 times per year    Active Member of [employer] or Organizations: Yes    Attends Banker Meetings: 1 to 4 times per year    Marital Status: Married    Tobacco Counseling Counseling given:  Not Answered    Clinical Intake:  Pre-visit preparation completed: Yes  Pain : No/denies pain     BMI - recorded: 24.86 Nutritional Status: BMI of 19-24  Normal Nutritional Risks: None  Lab Results  Component Value Date   HGBA1C 5.9 01/28/2024   HGBA1C 6.1 08/19/2023   HGBA1C 6.0 01/24/2023     How often do you need to have someone help you when you read instructions, pamphlets, or other written materials from your doctor or pharmacy?: 1 - Never  Interpreter Needed?: No  Information entered by :: Harbour Nordmeyer, RMA   Activities of Daily Living     04/09/2024   11:39 AM  In your present state of health, do you have any difficulty performing the following activities:  Hearing? 0  Vision? 0  Difficulty concentrating or making decisions? 0  Walking or climbing stairs? 0  Dressing or bathing? 0  Doing errands, shopping? 0  Preparing Food and eating ? N  Using the Toilet? N  In the past six months, have you accidently leaked urine? N  Do you have problems with loss of bowel control? N  Managing your Medications? N  Managing your Finances? N  Housekeeping or managing your Housekeeping? N    Patient Care Team: Adelia Homestead, MD as PCP - General (Internal Medicine) I have updated your Care Teams any recent Medical Services you may have received from other providers in the past year.     Assessment:   This is a routine wellness examination for [place].  Hearing/Vision screen Hearing Screening - Comments:: Denies hearing difficulties   Vision Screening - Comments:: Denies vision issues./[facility]/ Dr. Chip Coulter    Goals Addressed             This Visit's Progress    My goal for 2024 is to increase my physical activity.   On track      Depression Screen     04/09/2024   11:47 AM 04/04/2023    1:12 PM 01/24/2023   11:02 AM 01/24/2023   11:01 AM 10/09/2022    9:38 AM 05/01/2022    1:12 PM  03/12/2022    2:42 PM  PHQ 2/9 Scores  PHQ - 2 Score 0 0 0  0 0 0 0  PHQ- 9 Score 1 0 1  0 0 0    Fall Risk     04/09/2024   11:45 AM 01/28/2024    1:59 PM 11/21/2023    9:46 AM 11/21/2023    9:45 AM 08/19/2023    2:24 PM  Fall Risk   Falls in the past year? 0 0 0 0 0  Number falls in past yr: 0 0 0 0 0  Injury with Fall? 0 0 0 0 0  Risk for fall due to : No Fall Risks      Follow up Falls evaluation completed Falls evaluation completed Falls evaluation completed Falls evaluation completed Falls evaluation completed    MEDICARE RISK AT HOME:  Medicare Risk at Home Any stairs in or around the home?: Yes If so, are there any without handrails?: No Home free of loose throw rugs in walkways, pet beds, electrical cords, etc?: Yes Adequate lighting in your home to reduce risk of falls?: Yes Life alert?: No Use of a cane, walker or w/c?: No Grab bars in the bathroom?: No Shower chair or bench in shower?: Yes Elevated toilet seat or a handicapped toilet?: Yes  TIMED UP AND GO:  Was the test performed?  No  Cognitive Function: Declined/Normal: No cognitive concerns noted by patient or family. Patient alert, oriented, able to answer questions appropriately and recall recent events. No signs of memory loss or confusion.        04/04/2023    1:19 PM 03/12/2022    2:49 PM  6CIT Screen  What Year? 0 points 0 points  What month? 0 points 0 points  What time? 0 points 0 points  Count back from 20 0 points 0 points  Months in reverse 0 points 0 points  Repeat phrase 0 points 0 points  Total Score 0 points 0 points    Immunizations Immunization History  Administered Date(s) Administered   Fluad Quad(high Dose 65+) 06/16/2019, 08/08/2020, 07/14/2021, 07/03/2022   Fluad Trivalent(High Dose 65+) 07/19/2023   Influenza, High Dose Seasonal PF 07/27/2016, 06/27/2017, 09/17/2018   Influenza, Seasonal, Injecte, Preservative Fre 08/09/2014   Influenza,inj,Quad PF,6+ Mos 08/17/2015   Influenza-Unspecified 07/27/2016   Moderna Sars-Covid-2  Vaccination 01/02/2020, 02/02/2020, 08/18/2020   Pneumococcal Conjugate-13 07/27/2016   Pneumococcal Polysaccharide-23 09/16/2017   Tdap 02/03/2005, 10/22/2006, 10/02/2016   Zoster Recombinant(Shingrix) 10/25/2020, 12/22/2020    Screening Tests Health Maintenance  Topic Date Due   COVID-19 Vaccine (4 - 2024-25 season) 06/23/2023   INFLUENZA VACCINE  05/22/2024   Medicare Annual Wellness (AWV)  04/09/2025   MAMMOGRAM  12/08/2025   DTaP/Tdap/Td (4 - Td or Tdap) 10/02/2026   Colonoscopy  04/03/2027   Pneumococcal Vaccine: 50+ Years  Completed   DEXA SCAN  Completed   Hepatitis C Screening  Completed   Zoster Vaccines- Shingrix  Completed   HPV VACCINES  Aged Out   Meningococcal B Vaccine  Aged Out    Health Maintenance  Health Maintenance Due  Topic Date Due   COVID-19 Vaccine (4 - 2024-25 season) 06/23/2023   Health Maintenance Items Addressed: See Nurse Notes at the end of this note  Additional Screening:  Vision Screening: Recommended annual ophthalmology exams for early detection of glaucoma and other disorders of the eye. Would you like a referral to an eye doctor? No    Dental Screening: Recommended annual dental exams  for proper oral hygiene  Community Resource Referral / Chronic Care Management: CRR required this visit?  No   CCM required this visit?  No   Plan:    I have personally reviewed and noted the following in the patient's chart:   Medical and social history Use of alcohol, tobacco or illicit drugs  Current medications and supplements including opioid prescriptions. Patient is not currently taking opioid prescriptions. Functional ability and status Nutritional status Physical activity Advanced directives List of other physicians Hospitalizations, surgeries, and ER visits in previous 12 months Vitals Screenings to include cognitive, depression, and falls Referrals and appointments  In addition, I have reviewed and discussed with patient  certain preventive protocols, quality metrics, and best practice recommendations. A written personalized care plan for preventive services as well as general preventive health recommendations were provided to patient.   Dmetrius Ambs L Darryon Bastin, CMA   04/09/2024   After Visit Summary: (MyChart) Due to this being a telephonic visit, the after visit summary with patients personalized plan was offered to patient via MyChart   Notes: Nothing significant to report at this time.

## 2024-04-25 ENCOUNTER — Other Ambulatory Visit

## 2024-04-29 DIAGNOSIS — H04223 Epiphora due to insufficient drainage, bilateral lacrimal glands: Secondary | ICD-10-CM | POA: Diagnosis not present

## 2024-04-29 DIAGNOSIS — H02845 Edema of left lower eyelid: Secondary | ICD-10-CM | POA: Diagnosis not present

## 2024-04-29 DIAGNOSIS — H02842 Edema of right lower eyelid: Secondary | ICD-10-CM | POA: Diagnosis not present

## 2024-05-05 ENCOUNTER — Encounter: Payer: Self-pay | Admitting: Internal Medicine

## 2024-05-05 ENCOUNTER — Ambulatory Visit: Admitting: Internal Medicine

## 2024-05-05 VITALS — BP 160/80 | HR 70 | Temp 98.4°F | Ht 66.0 in | Wt 149.0 lb

## 2024-05-05 DIAGNOSIS — R7303 Prediabetes: Secondary | ICD-10-CM

## 2024-05-05 DIAGNOSIS — E782 Mixed hyperlipidemia: Secondary | ICD-10-CM

## 2024-05-05 DIAGNOSIS — I7121 Aneurysm of the ascending aorta, without rupture: Secondary | ICD-10-CM | POA: Insufficient documentation

## 2024-05-05 DIAGNOSIS — R0602 Shortness of breath: Secondary | ICD-10-CM

## 2024-05-05 DIAGNOSIS — K219 Gastro-esophageal reflux disease without esophagitis: Secondary | ICD-10-CM

## 2024-05-05 DIAGNOSIS — M0579 Rheumatoid arthritis with rheumatoid factor of multiple sites without organ or systems involvement: Secondary | ICD-10-CM

## 2024-05-05 DIAGNOSIS — R634 Abnormal weight loss: Secondary | ICD-10-CM | POA: Diagnosis not present

## 2024-05-05 LAB — COMPREHENSIVE METABOLIC PANEL WITH GFR
ALT: 8 U/L (ref 0–35)
AST: 18 U/L (ref 0–37)
Albumin: 4 g/dL (ref 3.5–5.2)
Alkaline Phosphatase: 49 U/L (ref 39–117)
BUN: 15 mg/dL (ref 6–23)
CO2: 28 meq/L (ref 19–32)
Calcium: 9.9 mg/dL (ref 8.4–10.5)
Chloride: 102 meq/L (ref 96–112)
Creatinine, Ser: 0.76 mg/dL (ref 0.40–1.20)
GFR: 77.6 mL/min (ref >60.00)
Glucose, Bld: 93 mg/dL (ref 70–99)
Potassium: 4 meq/L (ref 3.5–5.1)
Sodium: 139 meq/L (ref 135–145)
Total Bilirubin: 0.5 mg/dL (ref 0.2–1.2)
Total Protein: 7.8 g/dL (ref 6.0–8.3)

## 2024-05-05 LAB — FERRITIN: Ferritin: 442.5 ng/mL — ABNORMAL HIGH (ref 10.0–291.0)

## 2024-05-05 LAB — LIPID PANEL
Cholesterol: 223 mg/dL — ABNORMAL HIGH (ref 0–200)
HDL: 37.8 mg/dL — ABNORMAL LOW (ref >39.00)
LDL Cholesterol: 163 mg/dL — ABNORMAL HIGH (ref 0–99)
NonHDL: 184.83
Total CHOL/HDL Ratio: 6
Triglycerides: 109 mg/dL (ref 0.0–149.0)
VLDL: 21.8 mg/dL (ref 0.0–40.0)

## 2024-05-05 LAB — CBC
HCT: 31.8 % — ABNORMAL LOW (ref 36.0–46.0)
Hemoglobin: 10.1 g/dL — ABNORMAL LOW (ref 12.0–15.0)
MCHC: 31.8 g/dL (ref 30.0–36.0)
MCV: 82.7 fl (ref 78.0–100.0)
Platelets: 352 K/uL (ref 150.0–400.0)
RBC: 3.84 Mil/uL — ABNORMAL LOW (ref 3.87–5.11)
RDW: 17.5 % — ABNORMAL HIGH (ref 11.5–15.5)
WBC: 5.4 K/uL (ref 4.0–10.5)

## 2024-05-05 LAB — HEMOGLOBIN A1C: Hgb A1c MFr Bld: 6.2 % (ref 4.6–6.5)

## 2024-05-05 LAB — C-REACTIVE PROTEIN: CRP: 5.9 mg/dL (ref 0.5–20.0)

## 2024-05-05 LAB — SEDIMENTATION RATE: Sed Rate: 55 mm/h — ABNORMAL HIGH (ref 0–30)

## 2024-05-05 NOTE — Assessment & Plan Note (Signed)
 Ordered CTA for follow up.

## 2024-05-05 NOTE — Assessment & Plan Note (Signed)
 Some worse lately and suspect that as she is losing weight she is losing muscle and endurance. Asked her to start exercise routine.

## 2024-05-05 NOTE — Assessment & Plan Note (Signed)
 Checking Hga1c and adjust as needed.

## 2024-05-05 NOTE — Patient Instructions (Addendum)
 We will get the scan of the chest and the labs today.  Ok to get the MRI of the feet.

## 2024-05-05 NOTE — Assessment & Plan Note (Signed)
 Checking CBC, ESR, CRP to assess disease activity. She is changing meds several times this year and does not feel well controlled.

## 2024-05-05 NOTE — Assessment & Plan Note (Signed)
 Unclear if this is causing nausea. She is not taking anything for acid currently. Seeing GI next month.

## 2024-05-05 NOTE — Progress Notes (Signed)
   Subjective:   Patient ID: Jessica [name], female    DOB: 1951/07/20, 73 y.o.   MRN: [id number]  HPI The patient is a 73 YO female coming in for ongoing weight loss (down 9 pounds since April, down 24 pounds since Dec 2023, unintentional) and medical management (see A/P for details).   Review of Systems  Constitutional:  Positive for activity change, appetite change and unexpected weight change.  HENT: Negative.    Eyes: Negative.   Respiratory:  Negative for cough, chest tightness and shortness of breath.   Cardiovascular:  Negative for chest pain, palpitations and leg swelling.  Gastrointestinal:  Positive for nausea. Negative for abdominal distention, abdominal pain, constipation, diarrhea and vomiting.  Musculoskeletal:  Positive for arthralgias, joint swelling and myalgias.  Skin: Negative.   Neurological: Negative.   Psychiatric/Behavioral: Negative.      Objective:  Physical Exam Constitutional:      Appearance: She is well-developed.     Comments: Appears to have lost weight recently.   HENT:     Head: Normocephalic and atraumatic.  Cardiovascular:     Rate and Rhythm: Normal rate and regular rhythm.  Pulmonary:     Effort: Pulmonary effort is normal. No respiratory distress.     Breath sounds: Normal breath sounds. No wheezing or rales.  Abdominal:     General: Bowel sounds are normal. There is no distension.     Palpations: Abdomen is soft.     Tenderness: There is no abdominal tenderness. There is no rebound.  Musculoskeletal:        General: Tenderness present.     Cervical back: Normal range of motion.  Skin:    General: Skin is warm and dry.  Neurological:     Mental Status: She is alert and oriented to person, place, and time.     Coordination: Coordination normal.     Vitals:   05/05/24 1004  BP: (!) 160/80  Pulse: 70  Temp: 98.4 F (36.9 C)  TempSrc: Oral  SpO2: 99%  Weight: 149 lb (67.6 kg)  Height: 5' 6 (1.676 m)    Assessment &  Plan:

## 2024-05-05 NOTE — Assessment & Plan Note (Signed)
 Weight is continuing to trend downwards and struggling with nausea still. Seeing GI next month. She has had colonoscopy and CT abdomen/pelvis without findings to explain. She is due for CTA to chest aortic aneurysm ascending which is ordered today. No neurological symptoms to need imaging of brain/head. She has uncontrolled inflammation with her RA which could contribute. Concerning and still needing ongoing assessment and monitoring.

## 2024-05-05 NOTE — Assessment & Plan Note (Signed)
 Checking lipid panel and adjust as needed.

## 2024-05-06 DIAGNOSIS — M069 Rheumatoid arthritis, unspecified: Secondary | ICD-10-CM | POA: Diagnosis not present

## 2024-05-06 DIAGNOSIS — Z79899 Other long term (current) drug therapy: Secondary | ICD-10-CM | POA: Diagnosis not present

## 2024-05-06 DIAGNOSIS — M1990 Unspecified osteoarthritis, unspecified site: Secondary | ICD-10-CM | POA: Diagnosis not present

## 2024-05-07 ENCOUNTER — Ambulatory Visit: Payer: Self-pay | Admitting: Internal Medicine

## 2024-05-07 ENCOUNTER — Telehealth: Payer: Self-pay

## 2024-05-07 ENCOUNTER — Ambulatory Visit
Admission: RE | Admit: 2024-05-07 | Discharge: 2024-05-07 | Disposition: A | Discharge status: Home / Self Care | Source: Ambulatory Visit | Attending: Internal Medicine | Admitting: Internal Medicine

## 2024-05-07 DIAGNOSIS — I7121 Aneurysm of the ascending aorta, without rupture: Secondary | ICD-10-CM

## 2024-05-07 MED ORDER — IOPAMIDOL (ISOVUE-370) INJECTION 76%: 100.0000 mL | Freq: Once | INTRAVENOUS | Status: DC | PRN

## 2024-05-07 NOTE — Telephone Encounter (Signed)
 Copied from CRM [id number]. Topic: Clinical - Request for Lab/Test Order >> May 07, 2024  2:07 PM Berneda FALCON wrote: Reason for CRM: Pt states she went in for CT this morning, and they could not find the veins after several attempts. They informed the patient that she may need to schedule an ultrasound and need a new order for a CT/Ultrasound to have this completed.  Patient callback is [phone]

## 2024-05-07 NOTE — Telephone Encounter (Signed)
 Copied from CRM [id number]. Topic: General - Other >> May 07, 2024  1:13 PM [place] J wrote: Reason for CRM: Pharmacist, hospital from [facility]. They were unsuccessful with doing the IV for the patient and she is needing to go to the hospital in order for the study to be completed.

## 2024-05-08 ENCOUNTER — Ambulatory Visit
Admission: RE | Admit: 2024-05-08 | Discharge: 2024-05-08 | Disposition: A | Discharge status: Home / Self Care | Source: Ambulatory Visit | Attending: Podiatry | Admitting: Podiatry

## 2024-05-08 DIAGNOSIS — M7741 Metatarsalgia, right foot: Secondary | ICD-10-CM

## 2024-05-08 DIAGNOSIS — M19071 Primary osteoarthritis, right ankle and foot: Secondary | ICD-10-CM | POA: Diagnosis not present

## 2024-05-08 DIAGNOSIS — R600 Localized edema: Secondary | ICD-10-CM | POA: Diagnosis not present

## 2024-05-08 DIAGNOSIS — M19072 Primary osteoarthritis, left ankle and foot: Secondary | ICD-10-CM | POA: Diagnosis not present

## 2024-05-08 NOTE — Telephone Encounter (Signed)
**Note De-identified  [place] Obfuscation** Please advise 

## 2024-05-15 ENCOUNTER — Telehealth: Payer: Self-pay | Admitting: Cardiovascular Disease

## 2024-05-15 NOTE — Telephone Encounter (Signed)
 Pt is requesting a provider switch from Dr. Gollan to being a pt of Dr. Ranee again. Please confirm.

## 2024-05-18 ENCOUNTER — Ambulatory Visit: Payer: Self-pay | Admitting: Podiatry

## 2024-05-18 NOTE — Telephone Encounter (Signed)
 Definitely needs to be done as it is a follow up aneurysm.

## 2024-05-19 ENCOUNTER — Ambulatory Visit: Admitting: Family Medicine

## 2024-05-25 NOTE — Telephone Encounter (Signed)
 Pt needs a order put in for an ultrasound at the hospital

## 2024-05-26 DIAGNOSIS — E782 Mixed hyperlipidemia: Secondary | ICD-10-CM | POA: Diagnosis not present

## 2024-05-26 DIAGNOSIS — D649 Anemia, unspecified: Secondary | ICD-10-CM | POA: Diagnosis not present

## 2024-05-26 DIAGNOSIS — K5909 Other constipation: Secondary | ICD-10-CM | POA: Diagnosis not present

## 2024-05-26 DIAGNOSIS — Z1330 Encounter for screening examination for mental health and behavioral disorders, unspecified: Secondary | ICD-10-CM | POA: Diagnosis not present

## 2024-05-26 DIAGNOSIS — M0579 Rheumatoid arthritis with rheumatoid factor of multiple sites without organ or systems involvement: Secondary | ICD-10-CM | POA: Diagnosis not present

## 2024-05-26 DIAGNOSIS — R7309 Other abnormal glucose: Secondary | ICD-10-CM | POA: Diagnosis not present

## 2024-05-26 DIAGNOSIS — R634 Abnormal weight loss: Secondary | ICD-10-CM | POA: Diagnosis not present

## 2024-05-26 DIAGNOSIS — Z860100 Personal history of colon polyps, unspecified: Secondary | ICD-10-CM | POA: Diagnosis not present

## 2024-05-26 DIAGNOSIS — M8580 Other specified disorders of bone density and structure, unspecified site: Secondary | ICD-10-CM | POA: Diagnosis not present

## 2024-05-26 DIAGNOSIS — I7121 Aneurysm of the ascending aorta, without rupture: Secondary | ICD-10-CM | POA: Diagnosis not present

## 2024-05-26 NOTE — Progress Notes (Signed)
 Subjective   Patient ID:  Jessica [name] is a 73 y.o. (DOB [birth year]) female.     Patient presents with  . LSM    1st visit   Are you satisfied with your current level of health?:   []  Yes  []  No Personal goals for this visit are: []  Diet/ nutrition []  Weight Management []  Exercise []  Sleep []  Purpose/Connection []  Mental Health []  Substance Use  What would you like to gain from this lifestyle visit? []  More medical / scientific knowledge []  Practical Health tips []  Accountability/support []  Personalized plan    HPI   History of Present Illness The patient presents for evaluation of rheumatoid arthritis, anemia, weight loss, and constipation as her primary concerns at her Lifestyle Medicine Visit.     Rheumatoid Arthritis - She experiences daily pain, which she attributes to her rheumatoid arthritis. - She has been under the care of a rheumatologist for the past 2 to 3 years. - She reports painful muscles and swollen joints, particularly in her knees. - She also experiences morning nausea and lightheadedness. - She is currently on hydroxychloroquine and was previously on Humira, but it was discontinued due to side effects. - She also had adverse reactions to Rinvoq and Remicade. - She wants to know if there is anything she can do to help with her rheumatoid arthritis more naturally.  Weight Loss - She has been experiencing weight loss, with a total loss of 26 pounds over the past 2 years, even though she is not on a diet. - Her energy levels are low, and she often burps after eating, tasting the food she just consumed. - She recently had a colonoscopy and is scheduled for a follow-up next week with gastroenterology. - She is curious about the potential benefits of goat milk and flaxseed. - She has made dietary changes, including reducing her intake of sweets and sugary drinks, and increasing her consumption of brown rice, apples, water, salmon, fish, skinless chicken,  organic foods, and vegetables. - However, she does not consume much fruit. - She is currently taking probiotics and collagen (animal-based) supplements daily, as well as omega-3 supplements.  Anemia - She has been diagnosed with anemia and has a high RDW. - A few weeks ago, her doctor recommended a CT scan of her chest downwards, but the procedure could not be completed due to difficulty in finding her veins. - She was advised to go to the hospital, but no further instructions were given.  She has not rescheduled this at this point. - She was prescribed iron tablets for 6 weeks, which she states improved her condition, leading to the discontinuation of the medication.  Looking back in her records she did not have significant improvement of her hemoglobin and her ferritin levels were actually elevated.  No indication that she was iron deficient.  Constipation - She reports that her ankles have been gradually swelling. - She has been experiencing constipation and gas. - A CT scan in 11/2023 revealed a small gallstone and constipation in the colon.   Problem List[1] Up dated today Patient Active Problem List   Diagnosis Date Noted  . Normocytic anemia 05/28/2024  . Rheumatoid arthritis involving multiple sites with positive rheumatoid factor (*) 05/26/2024  . Impaired glucose metabolism 05/26/2024  . Mixed hyperlipidemia 05/26/2024  . Aneurysm of ascending aorta without rupture 05/26/2024  . Osteopenia 05/26/2024    DEXA 06/2023 at [facility] FRAX 10-year fracture risk calculator: 9.0 % for any major fracture and  1.2 % for hip fracture.  Pharmacologic therapy is recommended if 10 year fracture risk is >20% for  any major osteoporotic fracture or >3% for hip fracture.      . Arterial calcification - incidental finding on otherwise normal head CT 05/26/2024  . Chronic constipation 12/03/2023  . History of colonic polyps 12/03/2023  . Unintentional weight loss 12/03/2023    Current  Medications[2]  Pillar 1: Diet Food Insecurity: No Food Insecurity (04/08/2024)   Received from [facility]   Hunger Vital Sign   . Within the past 12 months, you worried that your food would run out before you got the money to buy more.: Never true   . Within the past 12 months, the food you bought just didn't last and you didn't have money to get more.: Never true    Three day food recall: Day 1:  []  Breakfast:   []  Snack:   []  Lunch:   []  Snack:   []  Dinner:   []  Snack:   []  Nocturnal snack:    []  Water:    Day2:  []  Breakfast:   []  Snack:   []  Lunch:   []  Snack:   []  Dinner:   []  Snack:   []  Nocturnal snack:    []  Water:   Day 3:   []  Breakfast:   []  Snack:   []  Lunch:   []  Snack:   []  Dinner:   []  Snack:   []  Nocturnal snack:    []  Water:   Over the past 2 weeks how often has eaten fast food, sugary drinks, prepackaged foods: []  None  [] Several days  []  More than 1/2 the days [] Nearly every day  On an average day has eaten how many servings of fruits ( not juice) and vegetables ( about 1 handful) []  >5  [] 4-5  []  2-3  []  <2  Wt Readings from Last 3 Encounters:  05/26/24 152 lb 6 oz (69.1 kg)  12/03/23 152 lb (68.9 kg)   Body mass index is 24.59 kg/m.   Pillar 2: Exercise  Exercises:  []  Yes   []  No Type: Duration: 30 min Frequency: 3 days per week  Physical Activity: Insufficiently Active (04/08/2024)   Received from [facility]   Exercise Vital Sign   . On average, how many days per week do you engage in moderate to strenuous exercise (like a brisk walk)?: 3 days   . On average, how many minutes do you engage in exercise at this level?: 30 min     Pillar 3: Sleep  Bedtime: Get up time: Duration of sleep needed to be refreshed:  Bedtime Routine: Routine before bed:       Has bedtime routine       []  Yes   []  No  Uses light emitting devices 1-2 hours before bedtime []  No    []  Yes  Uses Dim to warm white  light     []  Yes   []  No  Sleep:   Has delayed sleep      []  No    []  Yes Has fragmented sleep      []  No    []  Yes Has early AM awakening     []  No    []  Yes   Worries at night       []  No    []  Yes  Brain will not turn off      []  No    []  Yes Nocturia       []   No    []  Yes  Restless legs       []  No    []  Yes  Periodic Leg Movement     []  No    []  Yes  Snoring       []  No    []  Yes  Anxiety       []  No    []  Yes   Spouse disrupts sleep     []  No    []  Yes Pets in bedroom      []  No    []  Yes Green, blue, or white light      []  No    []  Yes Uncomfortable temperature for sleep    []  No    []  Yes Bedding is not comfortable     []  No    []  Yes Skips breakfast      []  No    []  Yes  Has breakfast 30-45 min post desired wake time  []  Yes   []  No   Epworth Sleepiness Scale      05/23/2024  EPWORTH SLEEPINESS SCALE  Sitting and reading 0  Watching TV 1  Sitting, inactive in a public place (e.g. a theatre or a meeting) 0  As a passenger in a car for an hour without a break 0  Lying down to rest in the afternoon when circumstances permit 1  Sitting and talking to someone 0  Sitting quietly after a lunch without alcohol 0  In a car, while stopped for a few minutes in traffic 0  Total score 2 *    Details      * Patient-reported         Pillar 4: Risky Behaviors  Short Social History[3]   Pillar 5: Social Connectivity  Married  Social Connections: Socially Integrated (04/08/2024)   Received from [facility]   Social Connection and Isolation Panel   . In a typical week, how many times do you talk on the phone with family, friends, or neighbors?: Twice a week   . How often do you get together with friends or relatives?: Once a week   . How often do you attend church or religious services?: More than 4 times per year   . Do you belong to any clubs or organizations such as church groups, unions, fraternal or athletic groups, or school groups?: Yes   . How often do you  attend meetings of the clubs or organizations you belong to?: 1 to 4 times per year   . Are you married, widowed, divorced, separated, never married, or living with a partner?: Married    Social History   Social History Narrative  . Not on file       In the past 2 weeks: Patient feels like life has meaning or purpose.   []  Yes   []  No  Patient is connected with any support network: []  Yes   []  No  Experiences positive: []  Community []  Spiritual []  Friend/ family []  Nature []  Yoga []  Meditation   Pillar 6: Stress   Perceived stress:        []  No  []  Yes  How often has felt: Unable to control the important things in life     []  Never  []  Seldom  []  Sometimes  []  Often  []  Always A lack of confidence about ability to handle personal problems []  Never  []  Seldom  []  Sometimes  []  Often  []  Always That things were  not going their way     []  Never  []  Seldom  []  Sometimes  []  Often  []  Always  It is hard to let go of things that upset them    []  Never  []  Seldom  []  Sometimes  []  Often  []  Always  Stress: No Stress Concern Present (04/08/2024)   Received from [facility] - Occupational Stress Questionnaire   . Do you feel stress - tense, restless, nervous, or anxious, or unable to sleep at night because your mind is troubled all the time - these days?: Not at all   Depression: At risk (05/23/2024)   Depression   . Depression Screening: 4    PHQ9      05/23/2024  Depression Screen  1. Little interest or pleasure in doing things 1  2. Feeling down, depressed, or hopeless 0  PHQ-2 Total Score 1 *  3. Trouble falling or staying asleep 2  4. Feeling tired or having little energy 1  5. Poor appetite or overeating 0  6. Feeling bad about yourself - or that you are a failure or have let yourself or your family down 0  7. Trouble concentrating on things, such as reading the newspaper or watching television 0  8. Moving or speaking so  slowly that other people could have noticed.  Or the opposite - being so fidgety or restless that you have been moving around a lot more than usual. 0  9. Thoughts that you would be better off dead, or of hurting yourself in some way. 0  10. How difficult have these problems made it for you to do your work, take care of things at home or get along with other people? Not difficult at all  PHQ Total Score 4 *  Interpretation: Minimal or No Depression *  PHQ-9 Interpretation of Score for Depression (Payor) Negative *    Details      * Patient-reported       GAD7 Review      05/23/2024  Generalized Anxiety Disorder 7 item (GAD-7)  1. Feeling nervous, anxious, or on the edge 0  2. Not being able to stop or control worrying 0  3. Worrying too much about different things 0  4. Trouble relaxing 0  5. Being so restless that it's hard to sit still 0  6. Being easily annoyed or irritable 0  7. Feeling afraid as if something awful might happen 0  Total Score 0 *  Interpretation: Normal *    Details      * Patient-reported        Food Insecurity: No Food Insecurity (04/08/2024)   Received from [facility]   Hunger Vital Sign   . Within the past 12 months, you worried that your food would run out before you got the money to buy more.: Never true   . Within the past 12 months, the food you bought just didn't last and you didn't have money to get more.: Never true   Financial Resource Strain: Low Risk  (04/08/2024)   Received from [facility]   Overall Financial Resource Strain (CARDIA)   . How hard is it for you to pay for the very basics like food, housing, medical care, and heating?: Not hard at all   Housing Stability: Low Risk  (04/08/2024)   Received from [facility] Stability Vital Sign   . In the last 12 months, was there  a time when you were not able to pay the mortgage or rent on time?: No   . In the past 12 months, how many times have you moved where you were  living?: 0   . At any time in the past 12 months, were you homeless or living in a shelter (including now)?: No   Transportation Needs: No Transportation Needs (04/08/2024)   Received from [facility] - Transportation   . In the past 12 months, has lack of transportation kept you from medical appointments or from getting medications?: No   . In the past 12 months, has lack of transportation kept you from meetings, work, or from getting things needed for daily living?: No   Intimate Partner Violence: Not At Risk (04/09/2024)   Received from [facility]   Humiliation, Afraid, Rape, and Kick questionnaire   . Within the last year, have you been afraid of your partner or ex-partner?: No   . Within the last year, have you been humiliated or emotionally abused in other ways by your partner or ex-partner?: No   . Within the last year, have you been kicked, hit, slapped, or otherwise physically hurt by your partner or ex-partner?: No   . Within the last year, have you been raped or forced to have any kind of sexual activity by your partner or ex-partner?: No         Reviewed and updated this visit by provider: Tobacco  Allergies  Meds  Problems  Med Hx  Surg Hx  Fam Hx        Review of Systems   Health Maintenance  Topic Date Due  . Colorectal Cancer Screening  Never done  . Medicare Annual Wellness  04/03/2024  . COVID-19 Vaccine (5 - 2024-25 season) 11/26/2024 (Originally 06/23/2023)  . Mammogram  12/03/2024  . RSV Adult and Pregnancy (1 - 1-dose 75+ series) 08/30/2025  . DEXA Scan  07/18/2026  . DTaP/Tdap/Td Vaccines (4 - Td or Tdap) 10/02/2026  . Zoster Vaccine  Completed  . Pneumococcal Vaccine: 50+ Years  Completed  . Meningococcal B Vaccine  Aged Out  . Hepatitis A Vaccine  Aged Out  . Meningococcal Conjugate Vaccine  Aged Out     Immunization History  Administered Date(s) Administered  . Influenza vaccine, quadrivalent, adjuvanted(Fluad) 06/16/2019,  08/08/2020, 07/14/2021, 07/03/2022  . Influenza, adjuvanted, trivalent, PF (Fluad) 07/19/2023  . Influenza, high dose seasonal, preservative-free(Fluzone) 07/27/2016, 06/27/2017, 09/17/2018  . Influenza, injectable, quadrivalent, preservative free (IIV4)(AfluriaPF,FluarixPF,FluzonePf,FlulavalPF) 08/17/2015  . Influenza, split virus, trivalent, PF (Afluria PF, FluLaval PF, Fluzone PF, Fluarix PF) 08/09/2014  . Moderna Covid-19 Vaccine Monovalent 166mcg/0.5ml IM 01/02/2020, 02/02/2020, 08/18/2020, 05/16/2021  . Pneumococcal Conjugate 13 (Prevnar) 07/27/2016  . Pneumococcal Polysaccharide 09/16/2017  . Tdap 02/03/2005, 10/22/2006, 10/02/2016  . Zoster Recombinant (Shingrix) 10/25/2020, 12/22/2020       Objective   Vitals:   05/26/24 0951  BP: 136/76  Patient Position: Sitting  Pulse: 77  Temp: 97.3 F (36.3 C)  TempSrc: Temporal  Height: 5' 6 (1.676 m)  Weight: 152 lb 6 oz (69.1 kg)  SpO2: 99%  BMI (Calculated): 24.6  PainSc: 10-Worst pain ever  PainLoc: Knee       Most Recent Value   Past ~6 months 05/26/2024   09:51 12/03/2023   09:35  BMI Screening and Follow Up  Height 5' 6 (1.676 m) 05/26/2024 5' 6 (1.676 m) 5' 6 (1.676 m)  Weight 152 lb 6 oz (69.1 kg) 05/26/2024 152 lb  6 oz (69.1 kg) 152 lb (68.9 kg)  BMI (Calculated) 24.6 05/26/2024 24.6 24.5    No results found for: HGBA1C, CHOL, HDL, LDL, TRIG, WBC, HGB, PLT, CREATININE, BUN, NA, K, CL, CO2, INR, TSH, ALT, AST, GGT, ALKPHOS, BILITOT       Physical Exam  Normocephalic, atraumatic Knees swollen.  Walks with a limp with a slow gait. Distressed over her current health issues but has linear thinking and is oriented x 4. No respiratory distress.  Impression   1. Chronic constipation   2. Unintentional weight loss   3. History of colonic polyps   4. Rheumatoid arthritis involving multiple sites with positive rheumatoid factor (*)   5. Impaired glucose metabolism   6.  Mixed hyperlipidemia   7. Aneurysm of ascending aorta without rupture   8. Osteopenia, unspecified location   9. Arterial calcification - incidental finding on otherwise normal head CT   10. Normocytic anemia      Plan   Assessment & Plan Rheumatoid Arthritis Diagnosed with rheumatoid arthritis with a positive rheumatoid factor, an autoimmune disease. Treatment plan: Currently on hydroxychloroquine (Plaquenil) and has experienced muscle aches and pains with previous medications like Humira and Rinvoq. Advised to continue current medication regimen and discuss any intolerable side effects with primary care physician. Whole food plant-based diet recommended to help reduce inflammation and improve symptoms.  Normocytic anemia Likely due to chronic disease rather than iron deficiency, as iron levels are adequate and B12 is in mid normal range per records from [facility]. Hemoglobin levels have fluctuated between 10.9 and 11.6 g/dL despite iron supplementation. Diagnostic plan: Folate level test will be conducted today to rule out folate deficiency. Treatment plan: Advised to take B12 1000 mcg daily over the counter. Referral to a kidney specialist may be necessary if hemoglobin levels continue to drop.  Weight Loss Lost 26 pounds within the past 2 years without dieting. Has hx of colon polyps Diagnostic plan: CT scan previously recommended and pending to investigate potential causes of weight loss. Treatment plan: Advised to follow up with primary care physician and GI doctor to get the CT scan done at the hospital. If the CT scan is normal, an EGD or functional gallbladder test may be considered by her provider. Up to date on colonoscopy.  Prediabetes/impaired glucose metabolism Impaired fasting glucose levels indicating prediabetes. Treatment plan: Whole food plant-based diet recommended to help with sugar control. Advised to monitor blood sugar levels and follow up with primary care  physician for further management.  Constipation History of constipation, which may be contributing to symptoms. Treatment plan: Whole food plant-based diet recommended to help alleviate constipation. Advised to drink 64 ounces of water daily, with half consumed before meals.  Hyperlipidemia with history of aneurysm of ascending aorta with incidental finding of arterial calcifications on her head CT (noted when reviewed chart) Component 05/05/24 <redacted file path> 08/19/23 <redacted file path> 01/24/23 <redacted file path> 07/25/22 <redacted file path> 03/06/22 <redacted file path> 10/30/21 <redacted file path>  Cholesterol 223 High   231 <redacted file path> High   180 <redacted file path>  197 <redacted file path>  163 <redacted file path>  159 <redacted file path>   Triglycerides 109.0  89.0 <redacted file path>  81.0 <redacted file path>  77.0 <redacted file path>  56.0 <redacted file path>  139.0 <redacted file path>   HDL 37.80 Low  51.40 <redacted file path> 39.60 <redacted file path> 52.70 <redacted file path> 45.70 <redacted file path> 31.40 <redacted  file path> Low   VLDL 21.8 17.8 <redacted file path> 16.2 <redacted file path> 15.4 <redacted file path> 11.2 <redacted file path> 27.8 <redacted file path>  LDL Cholesterol 163 High  838 <redacted file path> High  124 <redacted file path> High  129 <redacted file path> High  106 <redacted file path> High  100 <redacted file path> High   Total CHOL/HDL Ratio 6  4 <redacted file path>  5 <redacted file path>  4 <redacted file path>  4 <redacted file path>  5 <redacted file path>   NonHDL 184.83        Discussed that a whole food plant-based diet does help decrease cholesterol.  Will address further at future visit.  Follow-up: A follow-up appointment is scheduled for 6 to 8 weeks from now.  Patient Instructions   Follow up with your PCP and GI to get your CT done at the hospital and if that is fine, then also check likely an EGD or  functional gall bladder,  Their plans will be dictated by your CT results and symptoms. 2.  Anemia: Take B12 1000 mcg daily.  This is over the counter. YOu are not deficient, but you are on the lower side of normal.  Will check folate today.   I think you likely have anemia of chronic disease, but we will have to first complete the GI work up to know for sure that you are  not losing blood some place.  Vitamin D  2000 IU daily with a natural fat.  3.   A whole food plant based diet will help with inflammation and sugar control.   This will help your RA and also pre-diabetes.   Engine #2 7 Day Rescue Diet/ Cookbook and the How Not To Die  book/ Cookbook.  Plant-based cookbook by Rosina Cohens (a little different flavor profile using more spices like Rosemary and Thyme).  Plant You is another cookbook.  Game [employer] movie Forks Over [employer] for eating. Eat a rainbow of day of color. Eating about 30 + plants per week.   NOT volume but diversity ( If it passes your lips.).  Pick from whole grains, fruits, veggies, nuts and seeds, beans. Goal is to eat more plants than animals Eat to 80% full Drink 1/2 your water before the meal ( To help with body's confusion of thirst for hunger).  64 ounces of water a day. As you introduce new foods you may need to start with with amounts of a new food inirially to limit gas. Over the month the gas will decrease.  If you do not feel lie you can eat a meal then use [employer] as a meal replacements [email or web address]).   Another option is to use Orgain Plus superfoods.  This is not as good as Mallie Pinion.     4.   Muscle weakness I recommend you talk to your PCP about physical therapy to help with muscle strengthening.     Lifestyle Areas Addressed   Pillars:  []  Diet  []  Exercise  []  Sleep  []  Risky Behaviors  []  Social Connection  []  Stress  General education:  []  Blue Zones  [x]  WFPB eating   []  Microbiome  []  Pre/ Pro/  Postbiotics  []  Creating a habit  []  Addictions and dopamine   []  Neuroendocrine cell actions  []  Vitamins     Orders: Orders Placed This Encounter  Procedures  . Folate    Standing Status:  Future    Number of Occurrences:   1    Expiration Date:   05/26/2025     Medications:   Patient's Medications      * Accurate as of May 26, 2024 11:59 PM. Reflects encounter med changes as of last refresh        Continued Medications     Instructions  * ascorbic acid 1000 MG tablet Commonly known as: VITAMIN C  2,000 mg, Daily   * Ascorbic Acid 100 MG Chew  1 tablet, Daily   aspirin  EC tablet Commonly known as: ECOTRIN LOW DOSE  81 mg, Daily   * Cholecalciferol 125 MCG (5000 UT) Tabs  5,000 Units, Daily   * VITAMIN D -1000 MAX ST 25 MCG (1000 UT) tablet Generic drug: cholecalciferol  1,000 Units, Daily   hydroxychloroquine 200 MG tablet Commonly known as: PLAQUENIL  200 mg, 2 times a day   linaclotide  145 mcg capsule Commonly known as: LINZESS   145 mcg, Oral, Daily   magnesium  30 MG tablet  30 mg, Daily   magnesium  gluconate 550 mg tablet  30 mg, Daily   Multi Vitamin/Minerals Tabs  1 tablet, Daily   predniSONE  10 mg tablet Commonly known as: DELTASONE   10 mg, Daily      * * This list has 4 medication(s) that are the same as other medications prescribed for you. Read the directions carefully, and ask your doctor or other care provider to review them with you.        Discontinued Medications   HUMIRA CF 40 MG/0.4ML Pskt Syringe Generic drug: Adalimumab Stopped by: Bernarda Meres   Multi Vitamin Tabs Stopped by: Bernarda Meres   MULTI-VITAMIN tablet Stopped by: Bernarda Meres        Follow-up: 1 hour follow up planned  Documentation for time-based billing:  Total time spent of date of service was 60 minutes.  Patient care activities included preparing to see the patient such as reviewing the patient record, performing a medically appropriate history  and physical examination, counseling and educating the patient, family, and/or caregiver, ordering prescription medications, tests, or procedures, and documenting clinical information in the electronic or other health record.   Risks, benefits, and alternatives of the medications and treatment plan prescribed today were discussed, and patient expressed understanding. Plan follow-up as discussed or as needed if any worsening symptoms or change in condition.      Computer technology was used to create visit note.  Consent from the patient/caregiver was obtained prior to its use.       [1] Patient Active Problem List Diagnosis  . Chronic constipation  . History of colonic polyps  . Unintentional weight loss  . Rheumatoid arthritis involving multiple sites with positive rheumatoid factor (*)  . Impaired glucose metabolism  . Mixed hyperlipidemia  . Aneurysm of ascending aorta without rupture  . Osteopenia  . Arterial calcification - incidental finding on otherwise normal head CT  . Normocytic anemia  [2]  Current Outpatient Medications:  .  ascorbic acid (VITAMIN C) 1000 MG tablet, Take two tablets (2,000 mg dose) by mouth daily., Disp: , Rfl:  .  Ascorbic Acid 100 MG CHEW, Chew 1 tablet by mouth daily., Disp: , Rfl:  .  aspirin  (ECOTRIN LOW DOSE) EC tablet, Take one tablet (81 mg dose) by mouth daily., Disp: , Rfl:  .  cholecalciferol (VITAMIN D -1000 MAX ST) 1,000 units (25 mcg) tablet, Take one tablet (1,000 Units dose) by mouth daily., Disp: , Rfl:  .  Cholecalciferol 125 MCG (5000 UT) TABS, Take 5,000 Units by mouth daily., Disp: , Rfl:  .  hydroxychloroquine (PLAQUENIL) 200 MG tablet, Take one tablet (200 mg dose) by mouth 2 (two) times daily., Disp: , Rfl:  .  linaclotide  (LINZESS ) 145 mcg capsule, Take one capsule (145 mcg dose) by mouth daily., Disp: 90 capsule, Rfl: 1 .  magnesium  30 MG tablet, Take one tablet (30 mg dose) by mouth daily., Disp: , Rfl:  .  magnesium  gluconate  550 mg tablet, Take 30 mg by mouth daily., Disp: , Rfl:  .  Multiple Vitamins-Minerals (MULTI VITAMIN/MINERALS) TABS, Take 1 tablet by mouth daily., Disp: , Rfl:  .  predniSONE  (DELTASONE ) 10 mg tablet, Take one tablet (10 mg dose) by mouth daily., Disp: , Rfl: [3] Social History Tobacco Use  . Smoking status: Never    Passive exposure: Never  . Smokeless tobacco: Never  Vaping Use  . Vaping status: Never Used  Substance Use Topics  . Alcohol use: Never  . Drug use: Never  *Some images could not be shown.

## 2024-06-01 ENCOUNTER — Encounter (HOSPITAL_BASED_OUTPATIENT_CLINIC_OR_DEPARTMENT_OTHER): Payer: Self-pay | Admitting: Emergency Medicine

## 2024-06-01 ENCOUNTER — Other Ambulatory Visit: Payer: Self-pay

## 2024-06-01 ENCOUNTER — Emergency Department (HOSPITAL_BASED_OUTPATIENT_CLINIC_OR_DEPARTMENT_OTHER)
Admission: EM | Admit: 2024-06-01 | Discharge: 2024-06-01 | Disposition: A | Discharge status: Home / Self Care | Attending: Emergency Medicine | Admitting: Emergency Medicine

## 2024-06-01 ENCOUNTER — Emergency Department (HOSPITAL_BASED_OUTPATIENT_CLINIC_OR_DEPARTMENT_OTHER)

## 2024-06-01 DIAGNOSIS — M25561 Pain in right knee: Secondary | ICD-10-CM

## 2024-06-01 DIAGNOSIS — Z7982 Long term (current) use of aspirin: Secondary | ICD-10-CM | POA: Insufficient documentation

## 2024-06-01 DIAGNOSIS — M069 Rheumatoid arthritis, unspecified: Secondary | ICD-10-CM | POA: Insufficient documentation

## 2024-06-01 DIAGNOSIS — M25461 Effusion, right knee: Secondary | ICD-10-CM | POA: Diagnosis not present

## 2024-06-01 DIAGNOSIS — M1711 Unilateral primary osteoarthritis, right knee: Secondary | ICD-10-CM | POA: Diagnosis not present

## 2024-06-01 DIAGNOSIS — Z951 Presence of aortocoronary bypass graft: Secondary | ICD-10-CM | POA: Diagnosis not present

## 2024-06-01 DIAGNOSIS — I2510 Atherosclerotic heart disease of native coronary artery without angina pectoris: Secondary | ICD-10-CM | POA: Insufficient documentation

## 2024-06-01 LAB — SYNOVIAL CELL COUNT + DIFF, W/ CRYSTALS
Crystals, Fluid: NONE SEEN
Lymphocytes-Synovial Fld: 1 % (ref 0–20)
Monocyte-Macrophage-Synovial Fluid: 3 % — ABNORMAL LOW (ref 50–90)
Neutrophil, Synovial: 96 % — ABNORMAL HIGH (ref 0–25)
WBC, Synovial: 515 /mm3 — ABNORMAL HIGH (ref 0–200)

## 2024-06-01 LAB — CBC WITH DIFFERENTIAL/PLATELET
Abs Immature Granulocytes: 0.02 K/uL (ref 0.00–0.07)
Basophils Absolute: 0 K/uL (ref 0.0–0.1)
Basophils Relative: 0 %
Eosinophils Absolute: 0.1 K/uL (ref 0.0–0.5)
Eosinophils Relative: 1 %
HCT: 30.5 % — ABNORMAL LOW (ref 36.0–46.0)
Hemoglobin: 9.6 g/dL — ABNORMAL LOW (ref 12.0–15.0)
Immature Granulocytes: 0 %
Lymphocytes Relative: 29 %
Lymphs Abs: 1.6 K/uL (ref 0.7–4.0)
MCH: 27 pg (ref 26.0–34.0)
MCHC: 31.5 g/dL (ref 30.0–36.0)
MCV: 85.9 fL (ref 80.0–100.0)
Monocytes Absolute: 0.5 K/uL (ref 0.1–1.0)
Monocytes Relative: 8 %
Neutro Abs: 3.4 K/uL (ref 1.7–7.7)
Neutrophils Relative %: 62 %
Platelets: 258 K/uL (ref 150–400)
RBC: 3.55 MIL/uL — ABNORMAL LOW (ref 3.87–5.11)
RDW: 16.6 % — ABNORMAL HIGH (ref 11.5–15.5)
WBC: 5.5 K/uL (ref 4.0–10.5)
nRBC: 0 % (ref 0.0–0.2)

## 2024-06-01 LAB — BASIC METABOLIC PANEL WITH GFR
Anion gap: 13 (ref 5–15)
BUN: 9 mg/dL (ref 8–23)
CO2: 23 mmol/L (ref 22–32)
Calcium: 9.2 mg/dL (ref 8.9–10.3)
Chloride: 103 mmol/L (ref 98–111)
Creatinine, Ser: 0.69 mg/dL (ref 0.44–1.00)
GFR, Estimated: >60 mL/min (ref >60)
Glucose, Bld: 90 mg/dL (ref 70–99)
Potassium: 3.8 mmol/L (ref 3.5–5.1)
Sodium: 139 mmol/L (ref 135–145)

## 2024-06-01 LAB — C-REACTIVE PROTEIN: CRP: 11.2 mg/dL — ABNORMAL HIGH (ref <1.0)

## 2024-06-01 LAB — SEDIMENTATION RATE: Sed Rate: 84 mm/h — ABNORMAL HIGH (ref 0–22)

## 2024-06-01 MED ORDER — OXYCODONE-ACETAMINOPHEN 5-325 MG PO TABS
1.0000 | ORAL_TABLET | Freq: Once | ORAL | Status: AC
  Administered 2024-06-01 (×2): 1 via ORAL
  Filled 2024-06-01: qty 1

## 2024-06-01 MED ORDER — PREDNISONE 50 MG PO TABS: 60.0000 mg | ORAL_TABLET | Freq: Once | ORAL | Status: DC

## 2024-06-01 MED ORDER — METHYLPREDNISOLONE 4 MG PO TBPK: ORAL_TABLET | ORAL | 0 refills | Status: DC

## 2024-06-01 MED ORDER — LIDOCAINE-EPINEPHRINE (PF) 2 %-1:200000 IJ SOLN
INTRAMUSCULAR | Status: AC
Start: 2024-06-01 — End: 2024-06-01
  Administered 2024-06-01 (×2): 20 mL
  Filled 2024-06-01: qty 20

## 2024-06-01 MED ORDER — LIDOCAINE-EPINEPHRINE (PF) 2 %-1:200000 IJ SOLN: 20.0000 mL | Freq: Once | INTRAMUSCULAR | Status: AC

## 2024-06-01 MED ORDER — ONDANSETRON 4 MG PO TBDP
4.0000 mg | ORAL_TABLET | Freq: Once | ORAL | Status: AC
  Administered 2024-06-01 (×2): 4 mg via ORAL
  Filled 2024-06-01: qty 1

## 2024-06-01 MED ORDER — OXYCODONE-ACETAMINOPHEN 5-325 MG PO TABS: 1.0000 | ORAL_TABLET | Freq: Four times a day (QID) | ORAL | 0 refills | Status: DC | PRN

## 2024-06-01 MED ORDER — LIDOCAINE-EPINEPHRINE 2 %-1:100000 IJ SOLN: 20.0000 mL | Freq: Once | INTRAMUSCULAR | Status: DC

## 2024-06-01 MED ORDER — TRIAMCINOLONE ACETONIDE 40 MG/ML IJ SUSP
40.0000 mg | Freq: Once | INTRAMUSCULAR | Status: AC
  Administered 2024-06-01 (×2): 40 mg via INTRA_ARTICULAR
  Filled 2024-06-01: qty 5

## 2024-06-01 MED ORDER — ONDANSETRON HCL 4 MG PO TABS: 4.0000 mg | ORAL_TABLET | Freq: Three times a day (TID) | ORAL | 0 refills | Status: AC | PRN

## 2024-06-01 NOTE — ED Provider Notes (Addendum)
 [place] [facility] Provider Note   CSN: [id number] Arrival date & time: 06/01/24  9088     Patient presents with: Knee Pain   Jessica [name] is a 73 y.o. female.    Knee Pain    73 year old female with medical history significant for HLD, CAD status post CABG, GERD, rheumatoid arthritis currently only on 10 mg of prednisone  daily due to failure of multiple outpatient agents who presents to the emergency department with right knee pain and swelling.  The patient states that she has been fatigued for the past few days, has had right knee pain with associated swelling since this past Tuesday.  She endorses pain with range of motion.  She denies any fevers.  She denies any trauma or injury to the knee.  She has a history of knee swelling and other polyarthralgias and has a known diagnosis of rheumatoid arthritis.  She denies any history of gout.  Prior to Admission medications   Medication Sig Start Date End Date Taking? Authorizing Provider  methylPREDNISolone  (MEDROL  DOSEPAK) 4 MG TBPK tablet Take as directed on the box 06/01/24  Yes Jerrol Agent, MD  ondansetron  (ZOFRAN ) 4 MG tablet Take 1 tablet (4 mg total) by mouth every 8 (eight) hours as needed for nausea or vomiting. 06/01/24  Yes Jerrol Agent, MD  oxyCODONE -acetaminophen  (PERCOCET/ROXICET) 5-325 MG tablet Take 1 tablet by mouth every 6 (six) hours as needed for severe pain (pain score 7-10). 06/01/24  Yes Jerrol Agent, MD  Alpha-D-Galactosidase (ECK FOOD ENZYME PO) Take by mouth. 1 tablet po every day    [provider]  Ascorbic Acid (VITAMIN C) 1000 MG tablet Take 2,000 mg by mouth daily.    [provider]  aspirin  EC 81 MG tablet Take 1 tablet (81 mg total) by mouth daily. Swallow whole. 05/06/23   Gollan, Timothy J, MD  Cholecalciferol (VITAMIN D -3) 125 MCG (5000 UT) TABS Take 5,000 Units by mouth daily.    [provider]  LINZESS  145 MCG CAPS capsule  Take 145 mcg by mouth daily as needed.    [provider]  loratadine (CLARITIN) 10 MG tablet Take 10 mg by mouth every evening.     [provider]  Magnesium  Gluconate 550 MG TABS     [provider]  Multiple Vitamin (MULTIVITAMIN WITH MINERALS) TABS tablet Take 1 tablet by mouth daily.    [provider]  neomycin-polymyxin b-dexamethasone  (MAXITROL) 3.5-10000-0.1 OINT Place 1 Application into both eyes. 03/25/24   [provider]  Omega-3 Fatty Acids (OMEGA-3 EPA FISH OIL PO) Take by mouth. 1 capsule po every day    [provider]  predniSONE  (DELTASONE ) 10 MG tablet Take 10 mg by mouth as directed. PRN with flareups    [provider]  RINVOQ 15 MG TB24 Take 15 mg by mouth daily. Patient not taking: Reported on 04/09/2024 01/30/24   [provider]    Allergies: Codeine, Evolocumab , Sulfa antibiotics, and Sulfacetamide    Review of Systems  All other systems reviewed and are negative.   Updated Vital Signs BP (!) 146/66   Pulse 66   Temp 98.9 F (37.2 C)   Resp 15   Ht 5' 6 (1.676 m)   Wt 67.6 kg   SpO2 100%   BMI 24.05 kg/m   Physical Exam Vitals and nursing note reviewed.  Constitutional:      General: She is not in acute distress.    Appearance:  She is well-developed.  HENT:     Head: Normocephalic and atraumatic.  Eyes:     Conjunctiva/sclera: Conjunctivae normal.  Cardiovascular:     Rate and Rhythm: Normal rate and regular rhythm.     Heart sounds: No murmur heard. Pulmonary:     Effort: Pulmonary effort is normal. No respiratory distress.     Breath sounds: Normal breath sounds.  Abdominal:     Palpations: Abdomen is soft.     Tenderness: There is no abdominal tenderness.  Musculoskeletal:        General: Swelling and tenderness present. No signs of injury.     Cervical back: Neck supple.     Comments: Right knee with warmth, no erythema, palpable joint effusion present, pain with  passive ROM, 2+ distal pulses  Skin:    General: Skin is warm and dry.     Capillary Refill: Capillary refill takes less than 2 seconds.  Neurological:     Mental Status: She is alert.  Psychiatric:        Mood and Affect: Mood normal.     (all labs ordered are listed, but only abnormal results are displayed) Labs Reviewed  CBC WITH DIFFERENTIAL/PLATELET - Abnormal; Notable for the following components:      Result Value   RBC 3.55 (*)    Hemoglobin 9.6 (*)    HCT 30.5 (*)    RDW 16.6 (*)    All other components within normal limits  SEDIMENTATION RATE - Abnormal; Notable for the following components:   Sed Rate 84 (*)    All other components within normal limits  SYNOVIAL CELL COUNT + DIFF, W/ CRYSTALS - Abnormal; Notable for the following components:   Color, Synovial AMBER (*)    Appearance-Synovial TURBID (*)    WBC, Synovial 515 (*)    Neutrophil, Synovial 96 (*)    Monocyte-Macrophage-Synovial Fluid 3 (*)    All other components within normal limits  BODY FLUID CULTURE W GRAM STAIN  BASIC METABOLIC PANEL WITH GFR  C-REACTIVE PROTEIN  URIC ACID, BODY FLUID  PATHOLOGIST SMEAR REVIEW    EKG: None  Radiology: DG Knee Complete 4 Views Right Result Date: 06/01/2024 CLINICAL DATA:  Knee pain and swelling EXAM: RIGHT KNEE - COMPLETE 4+ VIEW COMPARISON:  Knee radiograph February 19, 2023 FINDINGS: No evidence of fracture, dislocation, or joint effusion. Distal femoral coarse calcifications likely representing bone infarct. Spiking of the tibial spine, tibial plateau and patellofemoral joint suggestive of degenerative changes. Fullness of right suprapatellar space indicating effusion. Surgical clips posterior to the knee joint. Atherosclerotic calcifications of the femoral arteries IMPRESSION: Suprapatellar joint effusion .  Correlate with clinical findings. Mild degenerative changes of the knee joint. No acute osseous abnormality. Electronically Signed   By: Megan  Zare M.D.   On:  06/01/2024 12:18     .Joint Aspiration/Arthrocentesis  Date/Time: 06/01/2024 11:30 AM  Performed by: Jerrol Agent, MD Authorized by: Jerrol Agent, MD   Consent:    Consent obtained:  Verbal   Consent given by:  Patient   Risks discussed:  Bleeding, infection and pain Universal protocol:    Patient identity confirmed:  Verbally with patient and arm band Location:    Location:  Knee   Knee:  R knee Anesthesia:    Anesthesia method:  Local infiltration   Local anesthetic:  Lidocaine  2% WITH epi Procedure details:    Preparation: Patient was prepped and draped in usual sterile fashion     Needle gauge:  18  G   Approach:  Medial   Aspirate amount:  3cc   Aspirate characteristics:  Yellow and blood-tinged   Steroid injected: yes     Specimen collected: yes   Post-procedure details:    Dressing:  Sterile dressing   Procedure completion:  Tolerated    Medications Ordered in the ED  oxyCODONE -acetaminophen  (PERCOCET/ROXICET) 5-325 MG per tablet 1 tablet (1 tablet Oral Given 06/01/24 1021)  ondansetron  (ZOFRAN -ODT) disintegrating tablet 4 mg (4 mg Oral Given 06/01/24 1023)  triamcinolone  acetonide (KENALOG -40) injection 40 mg (40 mg Intra-articular Given by Other 06/01/24 1119)  lidocaine -EPINEPHrine  (XYLOCAINE  W/EPI) 2 %-1:200000 (PF) injection 20 mL (20 mLs Infiltration Given 06/01/24 1119)                                    Medical Decision Making Amount and/or Complexity of Data Reviewed Labs: ordered. Radiology: ordered.  Risk Prescription drug management.     73 year old female with medical history significant for HLD, CAD status post CABG, GERD, rheumatoid arthritis currently only on 10 mg of prednisone  daily due to failure of multiple outpatient agents who presents to the emergency department with right knee pain and swelling.  The patient states that she has been fatigued for the past few days, has had right knee pain with associated swelling since this past  Tuesday.  She endorses pain with range of motion.  She denies any fevers.  She denies any trauma or injury to the knee.  She has a history of knee swelling and other polyarthralgias and has a known diagnosis of rheumatoid arthritis.  She denies any history of gout.  On arrival, the patient was afebrile, not tachycardic or tachypneic, BP 142/92, saturating her percent on room air.  Physical exam revealed right knee which was warm to the touch, palpable effusion present, pain with passive range of motion, distally neurovascularly intact.  Suspect most likely rheumatoid arthritis flare given the patient's known history and lack of outpatient disease modifying antirheumatic drug given failure of multiple medications within this class.  Lower suspicion for septic arthritis although considered, considered gout, considered osteoarthritis with effusion.  Considered prepatellar bursitis.  Patient states that she has had nausea and vomiting with opiate pain medication, will administer Percocet for pain control in addition to Zofran .  IV access obtained and labs were obtained.  X-ray imaging of the knee was performed to evaluate for erosions in the setting of known history of arthritis and possible fracture.  Lower suspicion for fracture in the setting of no clear evidence of trauma.  XR Knee:  IMPRESSION:  Suprapatellar joint effusion .  Correlate with clinical findings.    Mild degenerative changes of the knee joint.    No acute osseous abnormality.    Labs: CBC without a leukocytosis, mild anemia 9.6, BMP unremarkable. ESR 84. CRP pending  Arthrocentesis was performed as per the procedure note above.  40 mg of Kenalog  was injected into the joint.   Arthrocentesis labs: No crystals seen, WBC 515.   No evidence for septic arthritis, no evidence for gout, patient on repeat assessment feeling symptomatically improved and requesting discharge.  Suspect likely rheumatoid arthritis flare.  I recommended that  she follow-up outpatient with her PCP and rheumatologist, will discharge on a Medrol  Dosepak and opiates for breakthrough pain.  Return precautions provided in the event of any severe worsening symptoms.     Final diagnoses:  Acute pain of  right knee  Effusion of right knee  Rheumatoid arthritis flare [facility])    ED Discharge Orders          Ordered    ondansetron  (ZOFRAN ) 4 MG tablet  Every 8 hours PRN        06/01/24 1451    oxyCODONE -acetaminophen  (PERCOCET/ROXICET) 5-325 MG tablet  Every 6 hours PRN        06/01/24 1451    methylPREDNISolone  (MEDROL  DOSEPAK) 4 MG TBPK tablet        06/01/24 1451               Jerrol Agent, MD 06/01/24 1456    Jerrol Agent, MD 06/01/24 1456

## 2024-06-01 NOTE — Discharge Instructions (Addendum)
 Your joint aspiration did not show evidence of infection in the joint and showed no crystals to suggest gout.  Your symptoms are consistent with likely rheumatoid arthritis flare.  The steroid was injected into the knee joint to assist with inflammation.  Will discharge you on an oral steroid as well, recommend Tylenol  and Percocet for breakthrough pain.  Follow-up with your primary care provider and rheumatology outpatient, return for any severe worsening symptoms.

## 2024-06-01 NOTE — ED Triage Notes (Signed)
 Rt knee pain since last Tuesday has been swelling took 1 10 mg prednisone  and it helped but she did take anymore

## 2024-06-02 ENCOUNTER — Other Ambulatory Visit (INDEPENDENT_AMBULATORY_CARE_PROVIDER_SITE_OTHER)

## 2024-06-02 ENCOUNTER — Ambulatory Visit: Admitting: Internal Medicine

## 2024-06-02 ENCOUNTER — Encounter: Payer: Self-pay | Admitting: Internal Medicine

## 2024-06-02 VITALS — BP 130/68 | HR 57 | Ht 66.0 in | Wt 149.0 lb

## 2024-06-02 DIAGNOSIS — R634 Abnormal weight loss: Secondary | ICD-10-CM | POA: Diagnosis not present

## 2024-06-02 DIAGNOSIS — R5383 Other fatigue: Secondary | ICD-10-CM | POA: Diagnosis not present

## 2024-06-02 DIAGNOSIS — R110 Nausea: Secondary | ICD-10-CM

## 2024-06-02 DIAGNOSIS — D649 Anemia, unspecified: Secondary | ICD-10-CM

## 2024-06-02 DIAGNOSIS — R140 Abdominal distension (gaseous): Secondary | ICD-10-CM | POA: Diagnosis not present

## 2024-06-02 DIAGNOSIS — K5900 Constipation, unspecified: Secondary | ICD-10-CM

## 2024-06-02 LAB — IBC + FERRITIN
Ferritin: 358.5 ng/mL — ABNORMAL HIGH (ref 10.0–291.0)
Iron: 61 ug/dL (ref 42–145)
Saturation Ratios: 21.6 % (ref 20.0–50.0)
TIBC: 282.8 ug/dL (ref 250.0–450.0)
Transferrin: 202 mg/dL — ABNORMAL LOW (ref 212.0–360.0)

## 2024-06-02 LAB — VITAMIN D 25 HYDROXY (VIT D DEFICIENCY, FRACTURES): VITD: 31.87 ng/mL (ref 30.00–100.00)

## 2024-06-02 LAB — TSH: TSH: 0.76 u[IU]/mL (ref 0.35–5.50)

## 2024-06-02 LAB — RETICULOCYTES
ABS Retic: 51610 {cells}/uL (ref 20000–80000)
Retic Ct Pct: 1.3 %

## 2024-06-02 MED ORDER — LINACLOTIDE 72 MCG PO CAPS: 72.0000 ug | ORAL_CAPSULE | Freq: Every day | ORAL | 3 refills | Status: AC

## 2024-06-02 NOTE — Patient Instructions (Signed)
 _______________________________________________________  If your blood pressure at your visit was 140/90 or greater, please contact your primary care physician to follow up on this.  _______________________________________________________  If you are age 74 or older, your body mass index should be between 23-30. Your Body mass index is 24.05 kg/m. If this is out of the aforementioned range listed, please consider follow up with your Primary Care Provider.  If you are age 73 or younger, your body mass index should be between 19-25. Your Body mass index is 24.05 kg/m. If this is out of the aformentioned range listed, please consider follow up with your Primary Care Provider.   ________________________________________________________  The [facility] providers would like to encourage you to use MYCHART to communicate with providers for non-urgent requests or questions.  Due to long hold times on the telephone, sending your provider a message by [facility] may be a faster and more efficient way to get a response.  Please allow 48 business hours for a response.  Please remember that this is for non-urgent requests.  _______________________________________________________  [facility] is using a team-based approach to care.  Your team is made up of your doctor and two to three APPS. Our APPS (Nurse Practitioners and Physician Assistants) work with your physician to ensure care continuity for you. They are fully qualified to address your health concerns and develop a treatment plan. They communicate directly with your gastroenterologist to care for you. Seeing the Advanced Practice Practitioners on your physician's team can help you by facilitating care more promptly, often allowing for earlier appointments, access to diagnostic testing, procedures, and other specialty referrals.   Your provider has requested that you go to the basement level for lab work before leaving today. Press B on the  elevator. The lab is located at the first door on the left as you exit the elevator.  Please purchase the following medications over the counter and take as directed:  Take fiber supplements daily  START: Reflux gourmet as directed on handout.  We have sent the following medications to your pharmacy for you to pick up at your convenience:  Linzess  72 mcg one capsule daily  Linzess  works best when taken once a day every day, on an empty stomach, at least 30 minutes before your first meal of the day.  When Linzess  is taken daily as directed:  *Constipation relief is typically felt in about a week *IBS-C patients may begin to experience relief from belly pain and overall abdominal symptoms (pain, discomfort, and bloating) in about 1 week,   with symptoms typically improving over 12 weeks.  Diarrhea may occur in the first 2 weeks -keep taking it.  The diarrhea should go away and you should start having normal, complete, full bowel movements. It may be helpful to start treatment when you can be near the comfort of your own bathroom, such as a weekend.   You have been scheduled for an endoscopy. Please follow written instructions given to you at your visit today.  If you use inhalers (even only as needed), please bring them with you on the day of your procedure.  If you take any of the following medications, they will need to be adjusted prior to your procedure:   DO NOT TAKE 7 DAYS PRIOR TO TEST- Trulicity (dulaglutide) Ozempic, Wegovy (semaglutide) Mounjaro (tirzepatide) Bydureon Bcise (exanatide extended release)  DO NOT TAKE 1 DAY PRIOR TO YOUR TEST Rybelsus (semaglutide) Adlyxin (lixisenatide) Victoza (liraglutide) Byetta (exanatide) ___________________________________________________________________________  Due to recent changes in  healthcare laws, you may see the results of your imaging and laboratory studies on MyChart before your provider has had a chance to review them.   We understand that in some cases there may be results that are confusing or concerning to you. Not all laboratory results come back in the same time frame and the provider may be waiting for multiple results in order to interpret others.  Please give us  48 hours in order for your provider to thoroughly review all the results before contacting the office for clarification of your results.   Thank you for entrusting me with your care and choosing [facility].  Dr Federico

## 2024-06-02 NOTE — Progress Notes (Signed)
 Chief Complaint: Constipation, bloating  HPI : 73 year old female with history of rheumatoid arthritis, breast cancer, CAD, GERD presents with bloating and constipation  She follows a Mediterranean diet, which has helped with her RA symptoms. She has also cut out all sugary beverages. She has chronic issues with constipation, which has been present for years.  Interval History: She has had issues with constipation and bloating after her colonoscopy procedure. She has ben taking Linzess  but this would cause her ot have the runs at the 145 mcg dosage every day. She will taste certain foods after she eats her dinner in the afternoon.  She will sometimes feel nauseous. Denies vomiting. She has lost 26 lbs over the last couple of years. Denies dysphagia. Endorses issues with nausea  Wt Readings from Last 3 Encounters:  06/02/24 149 lb (67.6 kg)  06/01/24 149 lb 0.5 oz (67.6 kg)  05/05/24 149 lb (67.6 kg)   Past Medical History:  Diagnosis Date   Allergy    Breast cancer (HCC) 2000   Cancer (HCC)    breast   Colon polyp    Coronary artery disease    GERD (gastroesophageal reflux disease)    History of blood transfusion    Hx of CABG    3/20   Hyperlipidemia    Migraine    history of migrarines, none as an adult   Personal history of radiation therapy 2000   Rheumatoid arthritis (HCC) 2023     Past Surgical History:  Procedure Laterality Date   ABDOMINAL HYSTERECTOMY     BREAST LUMPECTOMY Right 2000   BREAST SURGERY Right    calcifaction removed   BUNIONECTOMY Bilateral    BUNIONECTOMY Bilateral    COLONOSCOPY     COLONOSCOPY W/ POLYPECTOMY     CORONARY ARTERY BYPASS GRAFT N/A 01/05/2019   Procedure: CORONARY ARTERY BYPASS GRAFTING (CABG), using right leg saphenous endoscopic and open vein harvest, exploration left leg;  Surgeon: Lucas Dorise POUR, MD;  Location: [facility] OR;  Service: Open Heart Surgery;  Laterality: N/A;   EYE SURGERY Bilateral    cataract   hemorrhoid     LEFT  HEART CATH AND CORONARY ANGIOGRAPHY N/A 01/01/2019   Procedure: LEFT HEART CATH AND CORONARY ANGIOGRAPHY;  Surgeon: Dann Candyce RAMAN, MD;  Location: [facility] INVASIVE CV LAB;  Service: Cardiovascular;  Laterality: N/A;   STERNAL WIRES REMOVAL N/A 05/11/2019   Procedure: STERNAL WIRES REMOVAL;  Surgeon: Lucas Dorise POUR, MD;  Location: [facility] OR;  Service: Thoracic;  Laterality: N/A;   TEE WITHOUT CARDIOVERSION N/A 01/05/2019   Procedure: TRANSESOPHAGEAL ECHOCARDIOGRAM (TEE);  Surgeon: Lucas Dorise POUR, MD;  Location: [facility] OR;  Service: Open Heart Surgery;  Laterality: N/A;   Family History  Problem Relation Age of Onset   Heart attack Father 58   Heart disease Brother    Asthma Paternal Aunt    Diabetes Paternal Aunt    Stroke Paternal Uncle    Kidney disease Paternal Uncle    Colon cancer Neg Hx    Esophageal cancer Neg Hx    Rectal cancer Neg Hx    Stomach cancer Neg Hx    Colon polyps Neg Hx    Social History   Tobacco Use   Smoking status: Never   Smokeless tobacco: Never  Vaping Use   Vaping status: Never Used  Substance Use Topics   Alcohol use: No   Drug use: No   Current Outpatient Medications  Medication Sig Dispense Refill   Alpha-D-Galactosidase (  ECK FOOD ENZYME PO) Take by mouth. 1 tablet po every day     Ascorbic Acid (VITAMIN C) 1000 MG tablet Take 2,000 mg by mouth daily.     aspirin  EC 81 MG tablet Take 1 tablet (81 mg total) by mouth daily. Swallow whole.     Cholecalciferol (VITAMIN D -3) 125 MCG (5000 UT) TABS Take 5,000 Units by mouth daily.     LINZESS  145 MCG CAPS capsule Take 145 mcg by mouth daily as needed.     loratadine (CLARITIN) 10 MG tablet Take 10 mg by mouth every evening.      Magnesium  Gluconate 550 MG TABS      methylPREDNISolone  (MEDROL  DOSEPAK) 4 MG TBPK tablet Take as directed on the box 1 each 0   Multiple Vitamin (MULTIVITAMIN WITH MINERALS) TABS tablet Take 1 tablet by mouth daily.     Omega-3 Fatty Acids (OMEGA-3 EPA FISH OIL PO) Take by  mouth. 1 capsule po every day     ondansetron  (ZOFRAN ) 4 MG tablet Take 1 tablet (4 mg total) by mouth every 8 (eight) hours as needed for nausea or vomiting. 12 tablet 0   oxyCODONE -acetaminophen  (PERCOCET/ROXICET) 5-325 MG tablet Take 1 tablet by mouth every 6 (six) hours as needed for severe pain (pain score 7-10). 15 tablet 0   predniSONE  (DELTASONE ) 10 MG tablet Take 10 mg by mouth as directed. PRN with flareups     neomycin-polymyxin b-dexamethasone  (MAXITROL) 3.5-10000-0.1 OINT Place 1 Application into both eyes.     RINVOQ 15 MG TB24 Take 15 mg by mouth daily. (Patient not taking: Reported on 04/09/2024)     No current facility-administered medications for this visit.   Allergies  Allergen Reactions   Codeine Nausea And Vomiting   Evolocumab  Other (See Comments)    MYALGIAS   Sulfa Antibiotics Swelling   Sulfacetamide Swelling   Physical Exam: BP 130/68   Pulse (!) 57   Ht 5' 6 (1.676 m)   Wt 149 lb (67.6 kg)   BMI 24.05 kg/m  Constitutional: Pleasant,well-developed, female in no acute distress. HEENT: Normocephalic and atraumatic. Conjunctivae are normal. No scleral icterus. Cardiovascular: Normal rate, regular rhythm.  Pulmonary/chest: Effort normal and breath sounds normal. No wheezing, rales or rhonchi. Abdominal: Soft, nondistended, nontender. Bowel sounds active throughout. There are no masses palpable. No hepatomegaly. Extremities: No edema Neurological: Alert and oriented to person place and time. Skin: Skin is warm and dry. No rashes noted. Psychiatric: Normal mood and affect. Behavior is normal.  Labs 05/2021: Hep B surface antigen negative. Hep B surface antibody NR. Hep B core total antibody negative.  Labs 03/2022: CBC with low Hb of 11.7. Nml AST and ALT.   Labs 04/2022: CBC with low Hb of 11.8. Ferritin nml at 234. HbA1C 6.2%. Vit B12 and Vit D nml.   Colonoscopy 03/20/24: - Preparation of the colon was poor. - Stool in the descending colon and in the  transverse colon. - One 10 mm polyp in the sigmoid colon, removed with a hot snare. Resected and retrieved. - Non- bleeding internal hemorrhoids. Path: 1. Surgical [P], colon, sigmoid, polyp (1) :       -TUBULAR ADENOMA       -ONE ADENOMATOUS FRAGMENT WITH STALK (NO ADENOMATOUS CHANGE IDENTIFIED AT STALK       MARGIN).       -NO HIGH-GRADE DYSPLASIA OR MALIGNANCY   Colonoscopy 04/02/24: - The examined portion of the ileum was normal. - Five 3 to 7 mm polyps in  the transverse colon and in the ascending colon, removed with a cold snare. Resected and retrieved. - Non- bleeding internal hemorrhoids. Path: 1. Surgical [P], colon, transverse and ascending, polyp (5) :       - TUBULAR ADENOMA(S).       - NO HIGH GRADE DYSPLASIA OR MALIGNANCY   ASSESSMENT AND PLAN: Bloating Constipation Nausea Fatigue Anemia Weight loss Patient presents with issues of constipation and bloating.  She required a 2-day prep in order to be able to be adequately cleaned for her recent colonoscopy.  However Linzess  at 145 mcg daily caused her to have too much diarrhea so we will decrease her dosage to 72 mcg daily to see if this is better tolerated.  I also asked her to start a daily fiber supplement and to try to stay well-hydrated.  If the Linzess  is not adequate, patient can use MiraLAX  in addition to the Linzess  as needed.  Patient does describe some symptoms that may be consistent with uncontrolled acid reflux so we will start her on some Reflux Gourmet to see if this helps with her symptoms.  Due to her ongoing issues with weight loss and anemia, we will obtain some additional labs today and then plan for an EGD for further evaluation.  I went over the risks and benefits of the EGD procedure with the patient, and she is agreeable to proceeding. Patient is also being scheduled for a pan CT scan to evaluate for alternative sources of weight loss. - Please stay well hydrated - Start daily fiber supplement - Start  Linzess  72 mcg every day - Okay to use Miralax  PRN - Reflux gourmet. Will give samples today - Check ferritin/IBC, reticulocytes, vitamin D , TSH - Patient has upcoming CT scan scheduled - EGD [facility]  Estefana Kidney, MD  I spent 40 minutes of time, including in depth chart review, independent review of results as outlined above, communicating results with the patient directly, face-to-face time with the patient, coordinating care, ordering studies and medications as appropriate, and documentation.

## 2024-06-03 ENCOUNTER — Ambulatory Visit: Payer: Self-pay | Admitting: Internal Medicine

## 2024-06-03 DIAGNOSIS — R718 Other abnormality of red blood cells: Secondary | ICD-10-CM

## 2024-06-04 DIAGNOSIS — H11823 Conjunctivochalasis, bilateral: Secondary | ICD-10-CM | POA: Diagnosis not present

## 2024-06-04 DIAGNOSIS — J310 Chronic rhinitis: Secondary | ICD-10-CM | POA: Diagnosis not present

## 2024-06-04 DIAGNOSIS — H04223 Epiphora due to insufficient drainage, bilateral lacrimal glands: Secondary | ICD-10-CM | POA: Diagnosis not present

## 2024-06-04 DIAGNOSIS — H0288B Meibomian gland dysfunction left eye, upper and lower eyelids: Secondary | ICD-10-CM | POA: Diagnosis not present

## 2024-06-04 DIAGNOSIS — H0288A Meibomian gland dysfunction right eye, upper and lower eyelids: Secondary | ICD-10-CM | POA: Diagnosis not present

## 2024-06-04 DIAGNOSIS — R4689 Other symptoms and signs involving appearance and behavior: Secondary | ICD-10-CM | POA: Diagnosis not present

## 2024-06-04 LAB — BODY FLUID CULTURE W GRAM STAIN: Culture: NO GROWTH

## 2024-06-08 ENCOUNTER — Ambulatory Visit: Admitting: Internal Medicine

## 2024-06-08 ENCOUNTER — Encounter: Payer: Self-pay | Admitting: Internal Medicine

## 2024-06-08 VITALS — BP 126/80 | HR 59 | Temp 98.5°F | Ht 66.0 in | Wt 148.0 lb

## 2024-06-08 DIAGNOSIS — M479 Spondylosis, unspecified: Secondary | ICD-10-CM | POA: Diagnosis not present

## 2024-06-08 DIAGNOSIS — D509 Iron deficiency anemia, unspecified: Secondary | ICD-10-CM

## 2024-06-08 DIAGNOSIS — K219 Gastro-esophageal reflux disease without esophagitis: Secondary | ICD-10-CM

## 2024-06-08 DIAGNOSIS — R5381 Other malaise: Secondary | ICD-10-CM

## 2024-06-08 DIAGNOSIS — R634 Abnormal weight loss: Secondary | ICD-10-CM | POA: Diagnosis not present

## 2024-06-08 DIAGNOSIS — K5904 Chronic idiopathic constipation: Secondary | ICD-10-CM

## 2024-06-08 DIAGNOSIS — I7121 Aneurysm of the ascending aorta, without rupture: Secondary | ICD-10-CM | POA: Diagnosis not present

## 2024-06-08 NOTE — Progress Notes (Unsigned)
 Subjective:   Patient ID: Jessica [name], female    DOB: 10/23/1950, 73 y.o.   MRN: [id number]  Discussed the use of AI scribe software for clinical note transcription with the patient, who gave verbal consent to proceed.  History of Present Illness Jessica [name] is a 73 year old female who presents with anemia.  She has a history of anemia, previously managed with iron supplements, which were discontinued after improvement. However, concerns about anemia persist, leading to further evaluations, including an upcoming endoscopy and a visit to the cancer center. A CT scan was attempted but not completed due to venous access difficulties.  She experiences constipation, bloating, and gas, managed with a low dosage of Linzess  taken daily on an empty stomach. A previous higher dose caused diarrhea. She also has acid reflux, characterized by tasting food upon burping, and is trying a different food enzyme.  She has significant sleep disturbances, sleeping only two to three hours per night, affecting her daily functioning. She reports muscle weakness, weight fluctuations, and pain in her feet. Pain in her hands and arms has decreased.  Recently, she visited the emergency room for a swollen knee, where fluid was drained, and she received a cortisone injection, resulting in improvement.  She is transitioning to a plant-based diet, preparing her menu, and ensuring adequate caloric intake. A protein shake by [employer] is used when she does not feel like eating a meal.  Her thyroid  function and B12 levels are normal. She has a history of colonoscopy with polyp removal and is experiencing constipation again.  Review of Systems  Constitutional:  Positive for appetite change and fatigue.  HENT: Negative.    Eyes: Negative.   Respiratory:  Negative for cough, chest tightness and shortness of breath.   Cardiovascular:  Negative for chest pain, palpitations and leg swelling.   Gastrointestinal:  Positive for constipation and nausea. Negative for abdominal distention, abdominal pain, diarrhea and vomiting.  Musculoskeletal:  Positive for arthralgias.  Skin: Negative.   Neurological: Negative.   Psychiatric/Behavioral: Negative.      Objective:  Physical Exam Constitutional:      Appearance: She is well-developed.  HENT:     Head: Normocephalic and atraumatic.  Cardiovascular:     Rate and Rhythm: Normal rate and regular rhythm.  Pulmonary:     Effort: Pulmonary effort is normal. No respiratory distress.     Breath sounds: Normal breath sounds. No wheezing or rales.  Abdominal:     General: Bowel sounds are normal. There is no distension.     Palpations: Abdomen is soft.     Tenderness: There is no abdominal tenderness. There is no rebound.  Musculoskeletal:        General: Tenderness present.     Cervical back: Normal range of motion.  Skin:    General: Skin is warm and dry.  Neurological:     Mental Status: She is alert and oriented to person, place, and time.     Coordination: Coordination normal.     Vitals:   06/08/24 1422  BP: 126/80  Pulse: (!) 59  Temp: 98.5 F (36.9 C)  TempSrc: Oral  SpO2: 99%  Weight: 148 lb (67.1 kg)  Height: 5' 6 (1.676 m)    Assessment and Plan Assessment & Plan Iron deficiency anemia with unintentional weight loss   Iron deficiency anemia persists despite previous improvement with iron supplementation. Malabsorption or gastrointestinal bleeding is considered. Reorder CT scan at the hospital and proceed with  endoscopy to evaluate the gastrointestinal tract. Continue plant-based diet for 28 days. Attend appointment at the cancer center for further evaluation.  Constipation   Chronic constipation persists. Previous high-dose Linzess  caused diarrhea. Continue low-dose Linzess  daily on an empty stomach until bowel regularity is achieved.  Gastroesophageal reflux symptoms   Symptoms include acid reflux and  food regurgitation. Use recommended food enzyme for acid reflux management.  Muscle weakness and deconditioning   Muscle weakness and deconditioning result from lack of physical activity. She is interested in physical therapy for muscle strengthening and improved mobility. Order physical therapy at the hospital in [place], twice a week for one month.

## 2024-06-09 ENCOUNTER — Encounter: Payer: Self-pay | Admitting: Internal Medicine

## 2024-06-09 NOTE — Assessment & Plan Note (Signed)
 Getting EGD soon and recently switched digestive enzymes.

## 2024-06-09 NOTE — Assessment & Plan Note (Signed)
 Needs PT to help with movement referral done.

## 2024-06-09 NOTE — Assessment & Plan Note (Signed)
 Needs follow up imaging which was unable to be done at [place] imaging. Reordered for hospital.

## 2024-06-09 NOTE — Assessment & Plan Note (Signed)
 She is seeing hematology in near future and recent labs are stable. She is getting EGD to assess cause in upcoming future and colonoscopy with multiple polypectomy within the last few months.

## 2024-06-09 NOTE — Assessment & Plan Note (Signed)
 Her weight is stable last month or so but is declining in the last few years without good explanation. She has recently met with lifestyle medicine and they think she is not eating enough nutrition and she is working on starting a new eating plan soon.

## 2024-06-09 NOTE — Assessment & Plan Note (Addendum)
 She is taking linzess  72 mcg daily and is working on getting this better controlled. Recent colonoscopy with multiple polypectomy.

## 2024-06-11 ENCOUNTER — Other Ambulatory Visit: Payer: Self-pay

## 2024-06-11 ENCOUNTER — Encounter: Payer: Self-pay | Admitting: Physical Therapy

## 2024-06-11 ENCOUNTER — Ambulatory Visit: Attending: Internal Medicine | Admitting: Physical Therapy

## 2024-06-11 DIAGNOSIS — M79604 Pain in right leg: Secondary | ICD-10-CM | POA: Diagnosis not present

## 2024-06-11 DIAGNOSIS — M25511 Pain in right shoulder: Secondary | ICD-10-CM | POA: Insufficient documentation

## 2024-06-11 DIAGNOSIS — M6281 Muscle weakness (generalized): Secondary | ICD-10-CM | POA: Diagnosis not present

## 2024-06-11 DIAGNOSIS — M479 Spondylosis, unspecified: Secondary | ICD-10-CM | POA: Diagnosis not present

## 2024-06-11 DIAGNOSIS — R5381 Other malaise: Secondary | ICD-10-CM | POA: Insufficient documentation

## 2024-06-11 NOTE — Therapy (Unsigned)
 OUTPATIENT PHYSICAL THERAPY Orthopedic  EVALUATION   Patient Name: Jessica [name] MRN: [id number] DOB:Nov 04, 1950, 73 y.o., female Today's Date: 06/12/2024  END OF SESSION:  PT End of Session - 06/11/24 1314     Visit Number 1    Number of Visits 9    Date for PT Re-Evaluation 07/17/24    PT Start Time 1318    PT Stop Time 1400    PT Time Calculation (min) 42 min    Equipment Utilized During Treatment Gait belt    Activity Tolerance Patient tolerated treatment well    Behavior During Therapy WFL for tasks assessed/performed          Past Medical History:  Diagnosis Date   Allergy    Breast cancer (HCC) 2000   Cancer (HCC)    breast   Colon polyp    Coronary artery disease    GERD (gastroesophageal reflux disease)    History of blood transfusion    Hx of CABG    3/20   Hyperlipidemia    Migraine    history of migrarines, none as an adult   Personal history of radiation therapy 2000   Rheumatoid arthritis (HCC) 2023   Past Surgical History:  Procedure Laterality Date   ABDOMINAL HYSTERECTOMY     BREAST LUMPECTOMY Right 2000   BREAST SURGERY Right    calcifaction removed   BUNIONECTOMY Bilateral    BUNIONECTOMY Bilateral    COLONOSCOPY     COLONOSCOPY W/ POLYPECTOMY     CORONARY ARTERY BYPASS GRAFT N/A 01/05/2019   Procedure: CORONARY ARTERY BYPASS GRAFTING (CABG), using right leg saphenous endoscopic and open vein harvest, exploration left leg;  Surgeon: Lucas Dorise POUR, MD;  Location: [facility] OR;  Service: Open Heart Surgery;  Laterality: N/A;   EYE SURGERY Bilateral    cataract   hemorrhoid     LEFT HEART CATH AND CORONARY ANGIOGRAPHY N/A 01/01/2019   Procedure: LEFT HEART CATH AND CORONARY ANGIOGRAPHY;  Surgeon: Dann Candyce RAMAN, MD;  Location: [facility] INVASIVE CV LAB;  Service: Cardiovascular;  Laterality: N/A;   STERNAL WIRES REMOVAL N/A 05/11/2019   Procedure: STERNAL WIRES REMOVAL;  Surgeon: Lucas Dorise POUR, MD;  Location: [facility] OR;  Service: Thoracic;   Laterality: N/A;   TEE WITHOUT CARDIOVERSION N/A 01/05/2019   Procedure: TRANSESOPHAGEAL ECHOCARDIOGRAM (TEE);  Surgeon: Lucas Dorise POUR, MD;  Location: [facility] OR;  Service: Open Heart Surgery;  Laterality: N/A;   Patient Active Problem List   Diagnosis Date Noted   Aneurysm of ascending aorta without rupture (HCC) 05/05/2024   Anemia 01/31/2024   Nausea 11/21/2023   Abdominal pain 11/21/2023   Constipation 11/21/2023   Weight loss 04/10/2023   HTN (hypertension) 08/14/2022   CAD (coronary artery disease) 08/14/2022   Pre-diabetes 07/25/2022   Pain due to onychomycosis of toenails of both feet 07/12/2022   Rheumatoid arthritis involving multiple sites (HCC) 03/08/2022   Insomnia 03/08/2022   Dysphagia 10/31/2021   Lymphadenopathy of head and neck 06/16/2021   Long-term use of immunosuppressant medication 05/31/2021   Fatigue 01/27/2021   Chronic pain of both shoulders 06/01/2020   Leg swelling 06/01/2020   Cervical disc disorder with radiculopathy of cervical region 05/24/2020   Post inflammatory hypopigmentation 12/30/2019   Claudication in peripheral vascular disease (HCC) 12/22/2019   Arthritis of carpometacarpal (CMC) joint of left thumb 10/28/2019   Hand arthritis 10/01/2019   Statin myopathy 08/30/2019   SOB (shortness of breath) on exertion 06/19/2019   Hyperlipidemia 04/08/2019   S/P  CABG x 5 01/05/2019   Left leg pain 09/30/2018   Degenerative joint disease of low back 01/09/2017   Routine general medical examination at a health care facility 10/02/2016   GERD (gastroesophageal reflux disease) 07/28/2016   H/O malignant neoplasm of breast 06/19/2016    PCP:    Rollene Almarie LABOR, MD    REFERRING PROVIDER:    Rollene Almarie LABOR, MD    REFERRING DIAG:  M47.9 (ICD-10-CM) - Degenerative joint disease of low back  R53.81 (ICD-10-CM) - Physical deconditioning    THERAPY DIAG:  Muscle weakness (generalized)  Right shoulder pain, unspecified  chronicity  Pain in right leg  Rationale for Evaluation and Treatment: Rehabilitation  ONSET DATE: 2-3 months   SUBJECTIVE:   SUBJECTIVE STATEMENT: Pt reports that she has been talking to MD about maintaining muscle mass. Reports that she has been in a lot of pain over the last few months in BUE and BLE as well as feet. States that she was told that it was Rheumatoid arthritis. Was seen in [facility] last week with increased knee pain; Reports that she had fluid taken from knee last Monday due to pain and swelling; no issues since then.   Reports that she was using the [facility] and [employer] for [employer] prior to a couple months ago   Per pt she was prescribed PT for 2 times per week for one month for Generalized strengthening, so that she can get back in the gym   PERTINENT HISTORY: Hx of RA, AAA, Anemia, sleep disturbances, and recent muscle weakness with weight loss.    PAIN:  Are you having pain? Yes: NPRS scale: 3/10 Pain location: R UE in the muscle Pain description: aches  Aggravating factors: movement.  Relieving factors: tylenol    PRECAUTIONS: None  RED FLAGS: Abdominal aneurysm: Yes: diagnosed last year. CT images have been ordered to    WEIGHT BEARING RESTRICTIONS: No  FALLS:  Has patient fallen in last 6 months? No  LIVING ENVIRONMENT: Lives with: lives with their spouse Lives in: House/apartment Stairs: Yes: Internal: flight steps; on left going up and External: 5 steps; on right going up, on left going up, and can reach both Has following equipment at home: None  OCCUPATION: retired   PLOF: Independent  PATIENT GOALS: improved strength and confidence with activity   NEXT MD VISIT: none with PCP. Will be having cardiac hematology consult in the next few weekes.   OBJECTIVE:  Note: Objective measures were completed at Evaluation unless otherwise noted.  DIAGNOSTIC FINDINGS:  DG Knee  IMPRESSION: Suprapatellar joint effusion .   Correlate with clinical findings.   Mild degenerative changes of the knee joint.   No acute osseous abnormality.  MR R foot  IMPRESSION: 1. Periarticular marrow edema throughout the midfoot which may reflect stress reaction, early Charcot arthropathy, CRPS versus underlying inflammatory arthropathy. 2. Severe osteoarthritis of the first MTP joint. 3. Moderate osteoarthritis of the second TMT joint. 4. Mild osteoarthritis of the second MTP joint.  MR L foot   IMPRESSION: 1. Periarticular marrow edema throughout the midfoot which may reflect stress reaction, early Charcot arthropathy, CRPS versus underlying inflammatory arthropathy. 2. Mild marrow edema along the plantar aspect of the second and third metatarsal heads likely reflecting mild stress reaction. 3. Severe osteoarthritis of the first MTP joint. 4. Moderate osteoarthritis of the second TMT joint. 5. Mild osteoarthritis of the second MTP joint.  PATIENT SURVEYS:  LEFS  Extreme difficulty/unable (0), Quite a bit of difficulty (  1), Moderate difficulty (2), Little difficulty (3), No difficulty (4) Survey date:    Any of your usual work, housework or school activities   2. Usual hobbies, recreational or sporting activities   3. Getting into/out of the bath   4. Walking between rooms   5. Putting on socks/shoes   6. Squatting    7. Lifting an object, like a bag of groceries from the floor   8. Performing light activities around your home   9. Performing heavy activities around your home   10. Getting into/out of a car   11. Walking 2 blocks   12. Walking 1 mile   13. Going up/down 10 stairs (1 flight)   14. Standing for 1 hour   15.  sitting for 1 hour   16. Running on even ground   17. Running on uneven ground   18. Making sharp turns while running fast   19. Hopping    20. Rolling over in bed   Score total:      UEFS  Extreme difficulty/unable (0), Quite a bit of difficulty (1), Moderate difficulty (2),  Little difficulty (3), No difficulty (4) Survey date:    Any of your usual work, household or school activities   2. Your usual hobbies, recreational/sport activities    3. Lifting a bag of groceries to waist level    4. Lifting a bag of groceries above your head   5. Grooming your hair   6. Pushing up on your hands (I.e. from bathtub or chair)   7. Preparing food (I.e. peeling/cutting)   8. Driving    9. Vacuuming, sweeping, or raking   10. Dressing    11. Doing up buttons   12. Using tools/appliances   13. Opening doors   14. Cleaning    15. Tying or lacing shoes   16. Sleeping    17. Laundering clothes (I.e. washing, ironing, folding)   18. Opening a jar   19. Throwing a ball   20. Carrying a small suitcase with your affected limb.    Score total:      To be completed   COGNITION: Overall cognitive status: Within functional limits for tasks assessed     SENSATION: WFL  EDEMA:  WFL on this day   MUSCLE LENGTH:   POSTURE: rounded shoulders and forward head  PALPATION: Mild pain to lateral deltiod and lateral bicep muscle belly   LOWER EXTREMITY ROM:  Grossly WFL  UPPER EXTREMITY ROM:   Active ROM Right eval Left eval  Shoulder flexion 128 145  Shoulder extension    Shoulder abduction 130 139  Shoulder adduction    Shoulder internal rotation Sitting T11-12 Supine: 80  Sitting T6-7 Supine: 100  Shoulder external rotation Supine: 40 68  Elbow flexion    Elbow extension    Wrist flexion    Wrist extension    Wrist ulnar deviation    Wrist radial deviation    Wrist pronation    Wrist supination    (Blank rows = not tested)  LOWER EXTREMITY MMT:  MMT Right eval Left eval  Hip flexion 4- 4  Hip extension    Hip abduction 4- 4  Hip adduction 4 4  Hip internal rotation    Hip external rotation    Knee flexion 4- 4-  Knee extension 4+ 4+  Ankle dorsiflexion    Ankle plantarflexion    Ankle inversion    Ankle eversion     (Blank rows =  not  tested)    UPPER EXTREMITY MMT:  MMT Right eval Left eval  Shoulder flexion 4- 4  Shoulder extension 5 5  Shoulder abduction 4- 4  Shoulder adduction 4+ 4+  Shoulder internal rotation 4 4  Shoulder external rotation 4- 4  Middle trapezius    Lower trapezius    Elbow flexion 4+ 4+  Elbow extension 4+ 4+  Wrist flexion    Wrist extension    Wrist ulnar deviation    Wrist radial deviation    Wrist pronation    Wrist supination    Grip strength (lbs)    (Blank rows = not tested)  Grip strength:  R 45# L 41#    FUNCTIONAL TESTS:  5 times sit to stand: 12.98 sec   GAIT: Distance walked: 60 Assistive device utilized: None Level of assistance: Complete Independence Comments: mildly flexed poster and decreased step length                                                                                                                                 TREATMENT DATE: 06/11/2024   PT evaluation.  UE HEP provided.    PATIENT EDUCATION:  Education details: POC. Benefits of PT Goals.  Person educated: Patient Education method: Explanation, Demonstration, and Handouts Education comprehension: verbalized understanding and returned demonstration  HOME EXERCISE PROGRAM: Access Code: [email or web address] URL: [email or web address] Date: 06/12/2024 Prepared by: Massie Dollar  Exercises - Isometric Shoulder Extension at Wall  - 1 x daily - 7 x weekly - 3 sets - 10 reps - 2-3 sec  hold - Standing Shoulder Flexion Wall Walk  - 1 x daily - 7 x weekly - 3 sets - 10 reps - 3-5 sec  hold - Isometric Shoulder Flexion at Wall  - 1 x daily - 7 x weekly - 3 sets - 10 reps  ASSESSMENT:  CLINICAL IMPRESSION: Patient is a 73 y.o. female who was seen today for physical therapy evaluation and treatment for deconditioning. Pt demonstrates BLE and BUE strength deficits with hx of RA flare up and AAA. Pt reports recent pain in the R knee and R shoulder, greater in the Shoulder on this  day with reduced ROM noted into flexion abduction and IR. Pt would like to transition to gym based fitness program following several weeks of PT interventions. Due to strength, ROM and functional deficits, pt will benefit form SKilled PT to improve overall QoL and allow return to PLOF including use of community fitness center.   OBJECTIVE IMPAIRMENTS: cardiopulmonary status limiting activity, decreased activity tolerance, decreased endurance, decreased knowledge of condition, difficulty walking, decreased ROM, decreased strength, hypomobility, increased fascial restrictions, impaired flexibility, impaired UE functional use, improper body mechanics, and pain.   ACTIVITY LIMITATIONS: carrying, lifting, standing, squatting, stairs, and locomotion level  PARTICIPATION LIMITATIONS: cleaning, laundry, shopping, and community activity  PERSONAL FACTORS: Age, Fitness, Time since onset of injury/illness/exacerbation, and 3+ comorbidities: RA, AAA, Anemia are  also affecting patient's functional outcome.   REHAB POTENTIAL: Excellent  CLINICAL DECISION MAKING: Evolving/moderate complexity  EVALUATION COMPLEXITY: Moderate   GOALS: Goals reviewed with patient? Yes  SHORT TERM GOALS: Target date: 07/10/2024    Patient will be independent in home exercise program to improve strength/mobility for better functional independence with ADLs. Baseline: initiated 8/21 Goal status: INITIAL   LONG TERM GOALS: Target date: 07/17/2024    Patient will increase LEFS and UEFS by 5 points each s to demonstratebetter functional mobility and better confidence with ADLs.  Baseline: to be completed  Goal status: INITIAL  2.  Patient (> 18 years old) will complete five times sit to stand test in < 10 seconds indicating an increased LE strength and improved balance. Baseline: 12.9sec Goal status: INITIAL  3.  Patient will improve R shoulder strength to least  4/5 to improve independence with ADLs including shopping  and dressing  Baseline: 4-/5 with pain  Goal status: INITIAL  4.  Patient will increase R shoulder ROM with IR to at least T 8 to allow improved independence with dressing and bathing tasks.  Baseline: T12 Goal status: INITIAL  5.  Patient will increase BLE strength to at least 4/5 to improve safety and independence with stair management and allow return to community fitness center  Baseline: 4-/5  Goal status: INITIAL     PLAN:  PT FREQUENCY: 1-2x/week  PT DURATION: 4 weeks  PLANNED INTERVENTIONS: [id number]- PT Re-evaluation, 97750- Physical Performance Testing, 97110-Therapeutic exercises, 97530- Therapeutic activity, [id number]- Neuromuscular re-education, 97535- Self Care, [id number]- Manual therapy, 97116- Gait training, [phone]- Ultrasound, Patient/Family education, Balance training, Stair training, Joint mobilization, Joint manipulation, DME instructions, Cryotherapy, and Moist heat  PLAN FOR NEXT SESSION: have pt complete LEFS and US  FS Advance RUE strengthening Initiation generalized LE strengthening.    Massie FORBES Dollar, PT 06/12/2024, 6:34 AM

## 2024-06-16 ENCOUNTER — Encounter: Payer: Self-pay | Admitting: Internal Medicine

## 2024-06-16 ENCOUNTER — Ambulatory Visit: Admitting: Physical Therapy

## 2024-06-18 ENCOUNTER — Inpatient Hospital Stay

## 2024-06-18 ENCOUNTER — Inpatient Hospital Stay: Attending: Oncology | Admitting: Oncology

## 2024-06-18 ENCOUNTER — Encounter: Payer: Self-pay | Admitting: Oncology

## 2024-06-18 VITALS — BP 128/86 | HR 68 | Temp 98.1°F | Resp 16 | Wt 150.0 lb

## 2024-06-18 DIAGNOSIS — R634 Abnormal weight loss: Secondary | ICD-10-CM | POA: Insufficient documentation

## 2024-06-18 DIAGNOSIS — D649 Anemia, unspecified: Secondary | ICD-10-CM

## 2024-06-18 DIAGNOSIS — M0579 Rheumatoid arthritis with rheumatoid factor of multiple sites without organ or systems involvement: Secondary | ICD-10-CM | POA: Insufficient documentation

## 2024-06-18 DIAGNOSIS — I2510 Atherosclerotic heart disease of native coronary artery without angina pectoris: Secondary | ICD-10-CM | POA: Diagnosis not present

## 2024-06-18 DIAGNOSIS — Z853 Personal history of malignant neoplasm of breast: Secondary | ICD-10-CM | POA: Insufficient documentation

## 2024-06-18 LAB — CBC WITH DIFFERENTIAL/PLATELET
Abs Immature Granulocytes: 0.02 K/uL (ref 0.00–0.07)
Basophils Absolute: 0 K/uL (ref 0.0–0.1)
Basophils Relative: 0 %
Eosinophils Absolute: 0.1 K/uL (ref 0.0–0.5)
Eosinophils Relative: 1 %
HCT: 33.9 % — ABNORMAL LOW (ref 36.0–46.0)
Hemoglobin: 10.4 g/dL — ABNORMAL LOW (ref 12.0–15.0)
Immature Granulocytes: 0 %
Lymphocytes Relative: 24 %
Lymphs Abs: 1.2 K/uL (ref 0.7–4.0)
MCH: 26.9 pg (ref 26.0–34.0)
MCHC: 30.7 g/dL (ref 30.0–36.0)
MCV: 87.8 fL (ref 80.0–100.0)
Monocytes Absolute: 0.4 K/uL (ref 0.1–1.0)
Monocytes Relative: 8 %
Neutro Abs: 3.4 K/uL (ref 1.7–7.7)
Neutrophils Relative %: 67 %
Platelets: 231 K/uL (ref 150–400)
RBC: 3.86 MIL/uL — ABNORMAL LOW (ref 3.87–5.11)
RDW: 16.8 % — ABNORMAL HIGH (ref 11.5–15.5)
WBC: 5.1 K/uL (ref 4.0–10.5)
nRBC: 0 % (ref 0.0–0.2)

## 2024-06-18 LAB — HEPATIC FUNCTION PANEL
ALT: 10 U/L (ref 0–44)
AST: 17 U/L (ref 15–41)
Albumin: 3.8 g/dL (ref 3.5–5.0)
Alkaline Phosphatase: 62 U/L (ref 38–126)
Bilirubin, Direct: <0.1 mg/dL (ref 0.0–0.2)
Total Bilirubin: 0.6 mg/dL (ref 0.0–1.2)
Total Protein: 7.7 g/dL (ref 6.5–8.1)

## 2024-06-18 LAB — RETIC PANEL
Immature Retic Fract: 4.7 % (ref 2.3–15.9)
RBC.: 3.91 MIL/uL (ref 3.87–5.11)
Retic Count, Absolute: 47.7 K/uL (ref 19.0–186.0)
Retic Ct Pct: 1.2 % (ref 0.4–3.1)
Reticulocyte Hemoglobin: 28.2 pg (ref >27.9)

## 2024-06-18 LAB — IRON AND TIBC
Iron: 40 ug/dL (ref 28–170)
Saturation Ratios: 12 % (ref 10.4–31.8)
TIBC: 329 ug/dL (ref 250–450)
UIBC: 289 ug/dL

## 2024-06-18 LAB — LACTATE DEHYDROGENASE: LDH: 164 U/L (ref 98–192)

## 2024-06-18 LAB — FERRITIN: Ferritin: 322 ng/mL — ABNORMAL HIGH (ref 11–307)

## 2024-06-18 LAB — FOLATE: Folate: 12.5 ng/mL (ref >5.9)

## 2024-06-18 LAB — VITAMIN B12: Vitamin B-12: 661 pg/mL (ref 180–914)

## 2024-06-18 NOTE — Assessment & Plan Note (Signed)
 Probably secondary to decreased oral intake due to appetite change/uncontrolled pain secondary to rheumatoid arthritis.  TSH was normal.   Encourage protein rich diet Monitor.

## 2024-06-18 NOTE — Assessment & Plan Note (Addendum)
 She is currently off treatments due to poor tolerance. ESR is chronically elevated, most recent level of 84. CRP has recently increased to 11.3.  Her symptoms are not well-controlled. Recommend patient to contact rheumatologist to discuss medication intolerance and explore alternative treatments.

## 2024-06-18 NOTE — Progress Notes (Signed)
 Hematology/Oncology Consult note [phone] [phone]        REFERRING PROVIDER: Federico Rosario BROCKS, MD   CHIEF COMPLAINTS/REASON FOR VISIT:  Evaluation of reticulocytopenia ,anemia, unintentional weight loss.   ASSESSMENT & PLAN:   Normocytic anemia Chronic anemia, likely anemia due to chronic disease. Check CBC, iron TIBC ferritin, B12, folate, multiple myeloma panel, light chain ratio, LDH, soluble transferrin receptor hepatic function.  Rheumatoid arthritis involving multiple sites [facility]) She is currently off treatments due to poor tolerance. ESR is chronically elevated, most recent level of 84. CRP has recently increased to 11.3.  Her symptoms are not well-controlled. Recommend patient to contact rheumatologist to discuss medication intolerance and explore alternative treatments.  Weight loss Probably secondary to decreased oral intake due to appetite change/uncontrolled pain secondary to rheumatoid arthritis.  TSH was normal.   Encourage protein rich diet Monitor.   Orders Placed This Encounter  Procedures   Ferritin    Standing Status:   Future    Number of Occurrences:   1    Expected Date:   06/18/2024    Expiration Date:   09/16/2024   Iron and TIBC    Standing Status:   Future    Number of Occurrences:   1    Expected Date:   06/18/2024    Expiration Date:   09/16/2024   Vitamin B12    Standing Status:   Future    Number of Occurrences:   1    Expected Date:   06/18/2024    Expiration Date:   09/16/2024   Folate    Standing Status:   Future    Number of Occurrences:   1    Expected Date:   06/18/2024    Expiration Date:   09/16/2024   CBC with Differential/Platelet    Standing Status:   Future    Number of Occurrences:   1    Expected Date:   06/18/2024    Expiration Date:   09/16/2024   Retic Panel    Standing Status:   Future    Number of Occurrences:   1    Expected Date:   06/18/2024    Expiration Date:   09/16/2024    Multiple Myeloma Panel (SPEP&IFE w/QIG)    Standing Status:   Future    Number of Occurrences:   1    Expected Date:   06/18/2024    Expiration Date:   09/16/2024   Kappa/lambda light chains    Standing Status:   Future    Number of Occurrences:   1    Expected Date:   06/18/2024    Expiration Date:   09/16/2024   Lactate dehydrogenase    Standing Status:   Future    Number of Occurrences:   1    Expected Date:   06/18/2024    Expiration Date:   09/16/2024   Miscellaneous [facility] test (send-out)    Standing Status:   Future    Number of Occurrences:   1    Expected Date:   06/18/2024    Expiration Date:   09/16/2024    Test name / description::   Soluble transferrin receptor [facility] [id number]   Hepatic function panel    Standing Status:   Future    Number of Occurrences:   1    Expected Date:   06/18/2024    Expiration Date:   06/18/2025   Follow-up in a few weeks to review results. All questions were answered.  The patient knows to call the clinic with any problems, questions or concerns.  Zelphia Cap, MD, PhD [facility] Hematology Oncology 06/18/2024   HISTORY OF PRESENTING ILLNESS:   Jessica [name] is a  73 y.o.  female with PMH listed below was seen in consultation at the request of  Federico Rosario BROCKS, MD  for evaluation of reticulocytopenia, anemia, unintentional weight loss  Discussed the use of AI scribe software for clinical note transcription with the patient, who gave verbal consent to proceed.   She presents with anemia, identified by her primary doctor, with a hemoglobin level of 9.6, decreased from four months ago.  She is scheduled for GI endoscopy on September 8th due to symptoms of nausea and lightheadedness.  She has a history of rheumatoid arthritis and is currently not on any medication for it due to intolerance to previous treatments, including Enbrel, Humira, and Rinvoq. She experiences joint pain and has been previously prescribed short courses of prednisone  for  severe pain but is not on long-term steroids. Her inflammation markers, including CRP and ESR, are elevated.  Currently she is not on any medication for rheumatoid arthritis.  She reports significant weight loss over the past three years, totaling at least 26 pounds, with fluctuations in the past six months between 152 and 148 pounds. She attributes her weight loss to a lack of appetite due to pain, which has led to reduced food intake. She has recently started a plant-based diet and has been consuming high-protein drinks to supplement her diet when she cannot eat enough.  She experiences fatigue and low energy levels, which she attributes to her rheumatoid arthritis and associated inflammation.  Patient has a history of breast cancer that was diagnosed and treated in 2000.  MEDICAL HISTORY:  Past Medical History:  Diagnosis Date   Allergy    Breast cancer (HCC) 2000   Cancer (HCC)    breast   Colon polyp    Coronary artery disease    GERD (gastroesophageal reflux disease)    History of blood transfusion    Hx of CABG    3/20   Hyperlipidemia    Migraine    history of migrarines, none as an adult   Personal history of radiation therapy 2000   Rheumatoid arthritis (HCC) 2023    SURGICAL HISTORY: Past Surgical History:  Procedure Laterality Date   ABDOMINAL HYSTERECTOMY     BREAST LUMPECTOMY Right 2000   BREAST SURGERY Right    calcifaction removed   BUNIONECTOMY Bilateral    BUNIONECTOMY Bilateral    COLONOSCOPY     COLONOSCOPY W/ POLYPECTOMY     CORONARY ARTERY BYPASS GRAFT N/A 01/05/2019   Procedure: CORONARY ARTERY BYPASS GRAFTING (CABG), using right leg saphenous endoscopic and open vein harvest, exploration left leg;  Surgeon: Lucas Dorise POUR, MD;  Location: [facility] OR;  Service: Open Heart Surgery;  Laterality: N/A;   EYE SURGERY Bilateral    cataract   hemorrhoid     LEFT HEART CATH AND CORONARY ANGIOGRAPHY N/A 01/01/2019   Procedure: LEFT HEART CATH AND CORONARY  ANGIOGRAPHY;  Surgeon: Dann Candyce RAMAN, MD;  Location: [facility] INVASIVE CV LAB;  Service: Cardiovascular;  Laterality: N/A;   STERNAL WIRES REMOVAL N/A 05/11/2019   Procedure: STERNAL WIRES REMOVAL;  Surgeon: Lucas Dorise POUR, MD;  Location: [facility] OR;  Service: Thoracic;  Laterality: N/A;   TEE WITHOUT CARDIOVERSION N/A 01/05/2019   Procedure: TRANSESOPHAGEAL ECHOCARDIOGRAM (TEE);  Surgeon: Lucas Dorise POUR, MD;  Location: [facility] OR;  Service: Open Heart Surgery;  Laterality: N/A;    SOCIAL HISTORY: Social History   Socioeconomic History   Marital status: Married    Spouse name: Lebron   Number of children: Not on file   Years of education: Not on file   Highest education level: Master's degree (e.g., MA, MS, MEng, MEd, MSW, MBA)  Occupational History   Occupation: RETIRED  Tobacco Use   Smoking status: Never   Smokeless tobacco: Never  Vaping Use   Vaping status: Never Used  Substance and Sexual Activity   Alcohol use: No   Drug use: No   Sexual activity: Yes  Other Topics Concern   Not on file  Social History Narrative   Lives with husband/2025   Social Drivers of Health   Financial Resource Strain: Low Risk  (04/08/2024)   Overall Financial Resource Strain (CARDIA)    Difficulty of Paying Living Expenses: Not hard at all  Food Insecurity: No Food Insecurity (06/18/2024)   Hunger Vital Sign    Worried About Running Out of Food in the Last Year: Never true    Ran Out of Food in the Last Year: Never true  Transportation Needs: No Transportation Needs (06/18/2024)   PRAPARE - Administrator, Civil Service (Medical): No    Lack of Transportation (Non-Medical): No  Physical Activity: Insufficiently Active (04/08/2024)   Exercise Vital Sign    Days of Exercise per Week: 3 days    Minutes of Exercise per Session: 30 min  Stress: No Stress Concern Present (04/08/2024)   [employer] of Occupational Health - Occupational Stress Questionnaire    Feeling of Stress: Not at  all  Social Connections: Socially Integrated (04/08/2024)   Social Connection and Isolation Panel    Frequency of Communication with Friends and Family: Twice a week    Frequency of Social Gatherings with Friends and Family: Once a week    Attends Religious Services: More than 4 times per year    Active Member of [employer] or Organizations: Yes    Attends Banker Meetings: 1 to 4 times per year    Marital Status: Married  Catering manager Violence: Not At Risk (06/18/2024)   Humiliation, Afraid, Rape, and Kick questionnaire    Fear of Current or Ex-Partner: No    Emotionally Abused: No    Physically Abused: No    Sexually Abused: No    FAMILY HISTORY: Family History  Problem Relation Age of Onset   Heart attack Father 55   Heart disease Brother    Asthma Paternal Aunt    Diabetes Paternal Aunt    Stroke Paternal Uncle    Kidney disease Paternal Uncle    Colon cancer Neg Hx    Esophageal cancer Neg Hx    Rectal cancer Neg Hx    Stomach cancer Neg Hx    Colon polyps Neg Hx     ALLERGIES:  is allergic to codeine, evolocumab , sulfa antibiotics, and sulfacetamide.  MEDICATIONS:  Current Outpatient Medications  Medication Sig Dispense Refill   Alpha-D-Galactosidase (ECK FOOD ENZYME PO) Take by mouth. 1 tablet po every day     Ascorbic Acid (VITAMIN C) 1000 MG tablet Take 2,000 mg by mouth daily.     aspirin  EC 81 MG tablet Take 1 tablet (81 mg total) by mouth daily. Swallow whole.     Cholecalciferol (VITAMIN D -3) 125 MCG (5000 UT) TABS Take 5,000 Units by mouth daily.     linaclotide  (LINZESS ) 72 MCG  capsule Take 1 capsule (72 mcg total) by mouth daily before breakfast. 30 capsule 3   loratadine (CLARITIN) 10 MG tablet Take 10 mg by mouth every evening.      Magnesium  Gluconate 550 MG TABS      methylPREDNISolone  (MEDROL  DOSEPAK) 4 MG TBPK tablet Take as directed on the box 1 each 0   Multiple Vitamin (MULTIVITAMIN WITH MINERALS) TABS tablet Take 1 tablet by mouth  daily.     Omega-3 Fatty Acids (OMEGA-3 EPA FISH OIL PO) Take by mouth. 1 capsule po every day     ondansetron  (ZOFRAN ) 4 MG tablet Take 1 tablet (4 mg total) by mouth every 8 (eight) hours as needed for nausea or vomiting. 12 tablet 0   oxyCODONE -acetaminophen  (PERCOCET/ROXICET) 5-325 MG tablet Take 1 tablet by mouth every 6 (six) hours as needed for severe pain (pain score 7-10). 15 tablet 0   predniSONE  (DELTASONE ) 10 MG tablet Take 10 mg by mouth as directed. PRN with flareups     Upadacitinib ER (RINVOQ) 15 MG TB24 1 tablet Orally Once a day; Duration: 30 days     No current facility-administered medications for this visit.    Review of Systems  Constitutional:  Positive for appetite change, fatigue and unexpected weight change. Negative for chills and fever.  HENT:   Negative for hearing loss and voice change.   Eyes:  Negative for eye problems.  Respiratory:  Negative for chest tightness and cough.   Cardiovascular:  Negative for chest pain.  Gastrointestinal:  Negative for abdominal distention, abdominal pain and blood in stool.  Endocrine: Negative for hot flashes.  Genitourinary:  Negative for difficulty urinating and frequency.   Musculoskeletal:  Positive for arthralgias.  Skin:  Negative for itching and rash.  Neurological:  Negative for extremity weakness.  Hematological:  Negative for adenopathy.  Psychiatric/Behavioral:  Negative for confusion.    PHYSICAL EXAMINATION:  Vitals:   06/18/24 0917  BP: 128/86  Pulse: 68  Resp: 16  Temp: 98.1 F (36.7 C)  SpO2: 100%   Filed Weights   06/18/24 0917  Weight: 150 lb (68 kg)    Physical Exam  LABORATORY DATA:  I have reviewed the data as listed    Latest Ref Rng & Units 06/18/2024   10:12 AM 06/01/2024   11:10 AM 05/05/2024   10:35 AM  CBC  WBC 4.0 - 10.5 K/uL 5.1  5.5  5.4   Hemoglobin 12.0 - 15.0 g/dL 89.5  9.6  89.8   Hematocrit 36.0 - 46.0 % 33.9  30.5  31.8   Platelets 150 - 400 K/uL 231  258  352.0        Latest Ref Rng & Units 06/18/2024   10:12 AM 06/01/2024   11:10 AM 05/05/2024   10:35 AM  CMP  Glucose 70 - 99 mg/dL  90  93   BUN 8 - 23 mg/dL  9  15   Creatinine 9.55 - 1.00 mg/dL  9.30  9.23   Sodium 864 - 145 mmol/L  139  139   Potassium 3.5 - 5.1 mmol/L  3.8  4.0   Chloride 98 - 111 mmol/L  103  102   CO2 22 - 32 mmol/L  23  28   Calcium  8.9 - 10.3 mg/dL  9.2  9.9   Total Protein 6.5 - 8.1 g/dL 7.7   7.8   Total Bilirubin 0.0 - 1.2 mg/dL 0.6   0.5   Alkaline Phos 38 - 126 U/L  62   49   AST 15 - 41 U/L 17   18   ALT 0 - 44 U/L 10   8       RADIOGRAPHIC STUDIES: I have personally reviewed the radiological images as listed and agreed with the findings in the report. DG Knee Complete 4 Views Right Result Date: 06/01/2024 CLINICAL DATA:  Knee pain and swelling EXAM: RIGHT KNEE - COMPLETE 4+ VIEW COMPARISON:  Knee radiograph February 19, 2023 FINDINGS: No evidence of fracture, dislocation, or joint effusion. Distal femoral coarse calcifications likely representing bone infarct. Spiking of the tibial spine, tibial plateau and patellofemoral joint suggestive of degenerative changes. Fullness of right suprapatellar space indicating effusion. Surgical clips posterior to the knee joint. Atherosclerotic calcifications of the femoral arteries IMPRESSION: Suprapatellar joint effusion .  Correlate with clinical findings. Mild degenerative changes of the knee joint. No acute osseous abnormality. Electronically Signed   By: Megan  Zare M.D.   On: 06/01/2024 12:18   MR FOOT LEFT WO CONTRAST Result Date: 05/12/2024 CLINICAL DATA:  Chronic mid foot pain EXAM: MRI OF THE LEFT FOOT WITHOUT CONTRAST TECHNIQUE: Multiplanar, multisequence MR imaging of the left forefoot was performed. No intravenous contrast was administered. COMPARISON:  None Available. FINDINGS: Bones/Joint/Cartilage No fracture or dislocation. Hallux valgus. No joint effusion. Severe osteoarthritis of the first MTP joint. Moderate  osteoarthritis of the second TMT joint. Mild osteoarthritis of the second MTP joint. Periarticular marrow edema throughout the midfoot which may reflect stress reaction versus CRPS versus underlying inflammatory arthropathy. Mild marrow edema along the plantar aspect of the second and third metatarsal heads likely reflecting mild stress reaction. Ligaments Collateral ligaments are intact.  Lisfranc ligament is intact. Muscles and Tendons Flexor, peroneal and extensor compartment tendons are intact. Muscles are normal. Soft tissue No fluid collection or hematoma. No soft tissue mass. Nonspecific soft tissue edema along the dorsal aspect of the foot. 13 mm ganglion cyst along the dorsal aspect of the talonavicular joint in with a narrow neck extending distally to the level of the middle cuneiform. IMPRESSION: 1. Periarticular marrow edema throughout the midfoot which may reflect stress reaction, early Charcot arthropathy, CRPS versus underlying inflammatory arthropathy. 2. Mild marrow edema along the plantar aspect of the second and third metatarsal heads likely reflecting mild stress reaction. 3. Severe osteoarthritis of the first MTP joint. 4. Moderate osteoarthritis of the second TMT joint. 5. Mild osteoarthritis of the second MTP joint. Electronically Signed   By: Julaine Blanch M.D.   On: 05/12/2024 14:45   MR FOOT RIGHT WO CONTRAST Result Date: 05/12/2024 CLINICAL DATA:  Bilateral mid foot pain EXAM: MRI OF THE RIGHT FOREFOOT WITHOUT CONTRAST TECHNIQUE: Multiplanar, multisequence MR imaging of the right forefoot was performed. No intravenous contrast was administered. COMPARISON:  None Available. FINDINGS: Bones/Joint/Cartilage No fracture or dislocation. Normal alignment. No joint effusion. Severe osteoarthritis of the first MTP joint. Moderate osteoarthritis of the second TMT joint. Mild osteoarthritis of the second MTP joint. Periarticular marrow edema throughout the midfoot which may reflect stress reaction  versus CRPS versus underlying inflammatory arthropathy. Ligaments Collateral ligaments are intact.  Lisfranc ligament is intact. Muscles and Tendons Flexor, peroneal and extensor compartment tendons are intact. Muscles are normal. Soft tissue No fluid collection or hematoma.  No soft tissue mass. IMPRESSION: 1. Periarticular marrow edema throughout the midfoot which may reflect stress reaction, early Charcot arthropathy, CRPS versus underlying inflammatory arthropathy. 2. Severe osteoarthritis of the first MTP joint. 3. Moderate osteoarthritis of the second TMT joint.  4. Mild osteoarthritis of the second MTP joint. Electronically Signed   By: Julaine Blanch M.D.   On: 05/12/2024 14:42   DG Foot Complete Left Result Date: 03/30/2024 Please see detailed radiograph report in office note.  DG Ankle 2 Views Left Result Date: 03/30/2024 Please see detailed radiograph report in office note.

## 2024-06-18 NOTE — Assessment & Plan Note (Signed)
 Chronic anemia, likely anemia due to chronic disease. Check CBC, iron TIBC ferritin, B12, folate, multiple myeloma panel, light chain ratio, LDH, soluble transferrin receptor hepatic function.

## 2024-06-19 LAB — MISC LABCORP TEST (SEND OUT): Labcorp test code: 143305

## 2024-06-23 LAB — KAPPA/LAMBDA LIGHT CHAINS
Kappa free light chain: 34.3 mg/L — ABNORMAL HIGH (ref 3.3–19.4)
Kappa, lambda light chain ratio: 1.98 — ABNORMAL HIGH (ref 0.26–1.65)
Lambda free light chains: 17.3 mg/L (ref 5.7–26.3)

## 2024-06-23 LAB — MULTIPLE MYELOMA PANEL, SERUM
Albumin SerPl Elph-Mcnc: 3.5 g/dL (ref 2.9–4.4)
Albumin/Glob SerPl: 1 (ref 0.7–1.7)
Alpha 1: 0.3 g/dL (ref 0.0–0.4)
Alpha2 Glob SerPl Elph-Mcnc: 0.9 g/dL (ref 0.4–1.0)
B-Globulin SerPl Elph-Mcnc: 1.6 g/dL — ABNORMAL HIGH (ref 0.7–1.3)
Gamma Glob SerPl Elph-Mcnc: 0.9 g/dL (ref 0.4–1.8)
Globulin, Total: 3.7 g/dL (ref 2.2–3.9)
IgA: 659 mg/dL — ABNORMAL HIGH (ref 64–422)
IgG (Immunoglobin G), Serum: 862 mg/dL (ref 586–1602)
IgM (Immunoglobulin M), Srm: 334 mg/dL — ABNORMAL HIGH (ref 26–217)
M Protein SerPl Elph-Mcnc: 0.6 g/dL — ABNORMAL HIGH
Total Protein ELP: 7.2 g/dL (ref 6.0–8.5)

## 2024-06-25 ENCOUNTER — Ambulatory Visit: Attending: Internal Medicine | Admitting: Physical Therapy

## 2024-06-25 DIAGNOSIS — M79642 Pain in left hand: Secondary | ICD-10-CM | POA: Insufficient documentation

## 2024-06-25 DIAGNOSIS — M25511 Pain in right shoulder: Secondary | ICD-10-CM | POA: Insufficient documentation

## 2024-06-25 DIAGNOSIS — M79604 Pain in right leg: Secondary | ICD-10-CM | POA: Diagnosis not present

## 2024-06-25 DIAGNOSIS — M25642 Stiffness of left hand, not elsewhere classified: Secondary | ICD-10-CM | POA: Diagnosis not present

## 2024-06-25 DIAGNOSIS — M542 Cervicalgia: Secondary | ICD-10-CM | POA: Insufficient documentation

## 2024-06-25 DIAGNOSIS — M6281 Muscle weakness (generalized): Secondary | ICD-10-CM | POA: Diagnosis not present

## 2024-06-25 NOTE — Therapy (Signed)
 OUTPATIENT PHYSICAL THERAPY Orthopedic Treatment   Patient Name: Jessica [name] MRN: [id number] DOB:1951/04/08, 73 y.o., female Today's Date: 06/25/2024  END OF SESSION:  PT End of Session - 06/25/24 1009     Visit Number 2    Number of Visits 9    Date for PT Re-Evaluation 07/17/24    PT Start Time 1018    PT Stop Time 1100    PT Time Calculation (min) 42 min    Equipment Utilized During Treatment Gait belt    Activity Tolerance Patient tolerated treatment well    Behavior During Therapy WFL for tasks assessed/performed          Past Medical History:  Diagnosis Date   Allergy    Breast cancer (HCC) 2000   Cancer (HCC)    breast   Colon polyp    Coronary artery disease    GERD (gastroesophageal reflux disease)    History of blood transfusion    Hx of CABG    3/20   Hyperlipidemia    Migraine    history of migrarines, none as an adult   Personal history of radiation therapy 2000   Rheumatoid arthritis (HCC) 2023   Past Surgical History:  Procedure Laterality Date   ABDOMINAL HYSTERECTOMY     BREAST LUMPECTOMY Right 2000   BREAST SURGERY Right    calcifaction removed   BUNIONECTOMY Bilateral    BUNIONECTOMY Bilateral    COLONOSCOPY     COLONOSCOPY W/ POLYPECTOMY     CORONARY ARTERY BYPASS GRAFT N/A 01/05/2019   Procedure: CORONARY ARTERY BYPASS GRAFTING (CABG), using right leg saphenous endoscopic and open vein harvest, exploration left leg;  Surgeon: Lucas Dorise POUR, MD;  Location: [facility] OR;  Service: Open Heart Surgery;  Laterality: N/A;   EYE SURGERY Bilateral    cataract   hemorrhoid     LEFT HEART CATH AND CORONARY ANGIOGRAPHY N/A 01/01/2019   Procedure: LEFT HEART CATH AND CORONARY ANGIOGRAPHY;  Surgeon: Dann Candyce RAMAN, MD;  Location: [facility] INVASIVE CV LAB;  Service: Cardiovascular;  Laterality: N/A;   STERNAL WIRES REMOVAL N/A 05/11/2019   Procedure: STERNAL WIRES REMOVAL;  Surgeon: Lucas Dorise POUR, MD;  Location: [facility] OR;  Service: Thoracic;   Laterality: N/A;   TEE WITHOUT CARDIOVERSION N/A 01/05/2019   Procedure: TRANSESOPHAGEAL ECHOCARDIOGRAM (TEE);  Surgeon: Lucas Dorise POUR, MD;  Location[facility] OR;  Service: Open Heart Surgery;  Laterality: N/A;   Patient Active Problem List   Diagnosis Date Noted   Aneurysm of ascending aorta without rupture (HCC) 05/05/2024   Normocytic anemia 01/31/2024   Nausea 11/21/2023   Abdominal pain 11/21/2023   Constipation 11/21/2023   Weight loss 04/10/2023   HTN (hypertension) 08/14/2022   CAD (coronary artery disease) 08/14/2022   Pre-diabetes 07/25/2022   Pain due to onychomycosis of toenails of both feet 07/12/2022   Rheumatoid arthritis involving multiple sites (HCC) 03/08/2022   Insomnia 03/08/2022   Dysphagia 10/31/2021   Lymphadenopathy of head and neck 06/16/2021   Long-term use of immunosuppressant medication 05/31/2021   Fatigue 01/27/2021   Chronic pain of both shoulders 06/01/2020   Leg swelling 06/01/2020   Cervical disc disorder with radiculopathy of cervical region 05/24/2020   Post inflammatory hypopigmentation 12/30/2019   Claudication in peripheral vascular disease (HCC) 12/22/2019   Arthritis of carpometacarpal (CMC) joint of left thumb 10/28/2019   Hand arthritis 10/01/2019   Statin myopathy 08/30/2019   SOB (shortness of breath) on exertion 06/19/2019   Hyperlipidemia 04/08/2019   S/P  CABG x 5 01/05/2019   Left leg pain 09/30/2018   Degenerative joint disease of low back 01/09/2017   Routine general medical examination at a health care facility 10/02/2016   GERD (gastroesophageal reflux disease) 07/28/2016   H/O malignant neoplasm of breast 06/19/2016    PCP:    Rollene Almarie LABOR, MD    REFERRING PROVIDER:    Rollene Almarie LABOR, MD    REFERRING DIAG:  M47.9 (ICD-10-CM) - Degenerative joint disease of low back  R53.81 (ICD-10-CM) - Physical deconditioning    THERAPY DIAG:  Muscle weakness (generalized)  Right shoulder pain, unspecified  chronicity  Pain in right leg  Neck pain on right side  Pain in left hand  Stiffness of left hand, not elsewhere classified  Rationale for Evaluation and Treatment: Rehabilitation  ONSET DATE: 2-3 months   SUBJECTIVE:   SUBJECTIVE STATEMENT:  Pt reports that she is still having some R shoulder pain. Rates pain today as 7/10. Feels like her RA is flarin up again with noted swelling in the L wrist and R hand    Wants to start going back to the [employer]   FROM EVAL: Pt reports that she has been talking to MD about maintaining muscle mass. Reports that she has been in a lot of pain over the last few months in BUE and BLE as well as feet. States that she was told that it was Rheumatoid arthritis. Was seen in [facility] last week with increased knee pain; Reports that she had fluid taken from knee last Monday due to pain and swelling; no issues since then.   Reports that she was using the [facility] and [employer] for [employer] prior to a couple months ago   Per pt she was prescribed PT for 2 times per week for one month for Generalized strengthening, so that she can get back in the gym   PERTINENT HISTORY: Hx of RA, AAA, Anemia, sleep disturbances, and recent muscle weakness with weight loss.    PAIN:  Are you having pain? Yes: NPRS scale: 3/10 Pain location: R UE in the muscle Pain description: aches  Aggravating factors: movement.  Relieving factors: tylenol    PRECAUTIONS: None  RED FLAGS: Abdominal aneurysm: Yes: diagnosed last year. CT images have been ordered to    WEIGHT BEARING RESTRICTIONS: No  FALLS:  Has patient fallen in last 6 months? No  LIVING ENVIRONMENT: Lives with: lives with their spouse Lives in: House/apartment Stairs: Yes: Internal: flight steps; on left going up and External: 5 steps; on right going up, on left going up, and can reach both Has following equipment at home: None  OCCUPATION: retired   PLOF: Independent  PATIENT GOALS:  improved strength and confidence with activity   NEXT MD VISIT: none with PCP. Will be having cardiac hematology consult in the next few weekes.   OBJECTIVE:  Note: Objective measures were completed at Evaluation unless otherwise noted.  DIAGNOSTIC FINDINGS:  DG Knee  IMPRESSION: Suprapatellar joint effusion .  Correlate with clinical findings.   Mild degenerative changes of the knee joint.   No acute osseous abnormality.  MR R foot  IMPRESSION: 1. Periarticular marrow edema throughout the midfoot which may reflect stress reaction, early Charcot arthropathy, CRPS versus underlying inflammatory arthropathy. 2. Severe osteoarthritis of the first MTP joint. 3. Moderate osteoarthritis of the second TMT joint. 4. Mild osteoarthritis of the second MTP joint.  MR L foot   IMPRESSION: 1. Periarticular marrow edema throughout the midfoot which may  reflect stress reaction, early Charcot arthropathy, CRPS versus underlying inflammatory arthropathy. 2. Mild marrow edema along the plantar aspect of the second and third metatarsal heads likely reflecting mild stress reaction. 3. Severe osteoarthritis of the first MTP joint. 4. Moderate osteoarthritis of the second TMT joint. 5. Mild osteoarthritis of the second MTP joint.  PATIENT SURVEYS:  LEFS  Extreme difficulty/unable (0), Quite a bit of difficulty (1), Moderate difficulty (2), Little difficulty (3), No difficulty (4) Survey date:    Any of your usual work, housework or school activities   2. Usual hobbies, recreational or sporting activities   3. Getting into/out of the bath   4. Walking between rooms   5. Putting on socks/shoes   6. Squatting    7. Lifting an object, like a bag of groceries from the floor   8. Performing light activities around your home   9. Performing heavy activities around your home   10. Getting into/out of a car   11. Walking 2 blocks   12. Walking 1 mile   13. Going up/down 10 stairs (1 flight)    14. Standing for 1 hour   15.  sitting for 1 hour   16. Running on even ground   17. Running on uneven ground   18. Making sharp turns while running fast   19. Hopping    20. Rolling over in bed   Score total:      UEFS  Extreme difficulty/unable (0), Quite a bit of difficulty (1), Moderate difficulty (2), Little difficulty (3), No difficulty (4) Survey date:    Any of your usual work, household or school activities   2. Your usual hobbies, recreational/sport activities    3. Lifting a bag of groceries to waist level    4. Lifting a bag of groceries above your head   5. Grooming your hair   6. Pushing up on your hands (I.e. from bathtub or chair)   7. Preparing food (I.e. peeling/cutting)   8. Driving    9. Vacuuming, sweeping, or raking   10. Dressing    11. Doing up buttons   12. Using tools/appliances   13. Opening doors   14. Cleaning    15. Tying or lacing shoes   16. Sleeping    17. Laundering clothes (I.e. washing, ironing, folding)   18. Opening a jar   19. Throwing a ball   20. Carrying a small suitcase with your affected limb.    Score total:      To be completed   COGNITION: Overall cognitive status: Within functional limits for tasks assessed     SENSATION: WFL  EDEMA:  WFL on this day   MUSCLE LENGTH:   POSTURE: rounded shoulders and forward head  PALPATION: Mild pain to lateral deltiod and lateral bicep muscle belly   LOWER EXTREMITY ROM:  Grossly WFL  UPPER EXTREMITY ROM:   Active ROM Right eval Left eval  Shoulder flexion 128 145  Shoulder extension    Shoulder abduction 130 139  Shoulder adduction    Shoulder internal rotation Sitting T11-12 Supine: 80  Sitting T6-7 Supine: 100  Shoulder external rotation Supine: 40 68  Elbow flexion    Elbow extension    Wrist flexion    Wrist extension    Wrist ulnar deviation    Wrist radial deviation    Wrist pronation    Wrist supination    (Blank rows = not tested)  LOWER  EXTREMITY MMT:  MMT Right  eval Left eval  Hip flexion 4- 4  Hip extension    Hip abduction 4- 4  Hip adduction 4 4  Hip internal rotation    Hip external rotation    Knee flexion 4- 4-  Knee extension 4+ 4+  Ankle dorsiflexion    Ankle plantarflexion    Ankle inversion    Ankle eversion     (Blank rows = not tested)    UPPER EXTREMITY MMT:  MMT Right eval Left eval  Shoulder flexion 4- 4  Shoulder extension 5 5  Shoulder abduction 4- 4  Shoulder adduction 4+ 4+  Shoulder internal rotation 4 4  Shoulder external rotation 4- 4  Middle trapezius    Lower trapezius    Elbow flexion 4+ 4+  Elbow extension 4+ 4+  Wrist flexion    Wrist extension    Wrist ulnar deviation    Wrist radial deviation    Wrist pronation    Wrist supination    Grip strength (lbs)    (Blank rows = not tested)  Grip strength:  R 45# L 41#    FUNCTIONAL TESTS:  5 times sit to stand: 12.98 sec   GAIT: Distance walked: 60 Assistive device utilized: None Level of assistance: Complete Independence Comments: mildly flexed poster and decreased step length                                                                                                                                 TREATMENT DATE: 06/11/2024 TE and TA for improved strengthen and shoulder mobility to improve safety with functional reaching and ADLs tasks.    Standing:  Isometric shoulder extension with shoulders against wall.  x 10, 3 sec hold Isometric shoulder flexion x 10, 3 sec hold Isometric shoulder shoulder abduction and ER x 10, 3 sec hold  Wall slide x 10 to 90-100deg flexion   Seated UT stretch x 20 sec  Seated scapular retraction and shoulder ER with bolster behind back. X 15  Matrix Seated mid row palms down 15 # 2x10 Matrix Seated low row palms up 15# 2x10   Front press with 1# DB 2 x 10 to ~90deg flexion     PATIENT EDUCATION:  Education details: POC. Benefits of PT Goals. Encouraged to trail gym  based work out with emphasis on cardio with light UE strengthening  Person educated: Patient Education method: Explanation, Demonstration, and Handouts Education comprehension: verbalized understanding and returned demonstration  HOME EXERCISE PROGRAM: Access Code: [email or web address] URL: [email or web address] Date: 06/12/2024 Prepared by: Massie Dollar  Exercises - Isometric Shoulder Extension at Wall  - 1 x daily - 7 x weekly - 3 sets - 10 reps - 2-3 sec  hold - Standing Shoulder Flexion Wall Walk  - 1 x daily - 7 x weekly - 3 sets - 10 reps - 3-5 sec  hold - Isometric Shoulder Flexion at Wall  - 1 x daily - 7 x  weekly - 3 sets - 10 reps    ASSESSMENT:  CLINICAL IMPRESSION: Patient is a 73 y.o. female who was seen today for physical therapy evaluation and treatment for deconditioning. Pt demonstrates BLE and BUE strength deficits with hx of RA flare up and AAA. Pt rates UE disability 23% through [employer]. Continued UE strengthening in pain free range. Improved ability to perform resistive interventions. Encouraged to start gym based fitness program.  Due to strength, ROM and functional deficits, pt will benefit form SKilled PT to improve overall QoL and allow return to PLOF including use of community fitness center.   OBJECTIVE IMPAIRMENTS: cardiopulmonary status limiting activity, decreased activity tolerance, decreased endurance, decreased knowledge of condition, difficulty walking, decreased ROM, decreased strength, hypomobility, increased fascial restrictions, impaired flexibility, impaired UE functional use, improper body mechanics, and pain.   ACTIVITY LIMITATIONS: carrying, lifting, standing, squatting, stairs, and locomotion level  PARTICIPATION LIMITATIONS: cleaning, laundry, shopping, and community activity  PERSONAL FACTORS: Age, Fitness, Time since onset of injury/illness/exacerbation, and 3+ comorbidities: RA, AAA, Anemia are also affecting patient's functional  outcome.   REHAB POTENTIAL: Excellent  CLINICAL DECISION MAKING: Evolving/moderate complexity  EVALUATION COMPLEXITY: Moderate   GOALS: Goals reviewed with patient? Yes  SHORT TERM GOALS: Target date: 07/10/2024    Patient will be independent in home exercise program to improve strength/mobility for better functional independence with ADLs. Baseline: initiated 8/21 Goal status: INITIAL   LONG TERM GOALS: Target date: 07/17/2024    Patient will increase LEFS and Quick dash by 5 points each s to demonstratebetter functional mobility and better confidence with ADLs.  Baseline: 22.7% quick dash  Goal status: INITIAL  2.  Patient (> 18 years old) will complete five times sit to stand test in < 10 seconds indicating an increased LE strength and improved balance. Baseline: 12.9sec Goal status: INITIAL  3.  Patient will improve R shoulder strength to least  4/5 to improve independence with ADLs including shopping and dressing  Baseline: 4-/5 with pain  Goal status: INITIAL  4.  Patient will increase R shoulder ROM with IR to at least T 8 to allow improved independence with dressing and bathing tasks.  Baseline: T12 Goal status: INITIAL  5.  Patient will increase BLE strength to at least 4/5 to improve safety and independence with stair management and allow return to community fitness center  Baseline: 4-/5  Goal status: INITIAL     PLAN:  PT FREQUENCY: 1-2x/week  PT DURATION: 4 weeks  PLANNED INTERVENTIONS: 97164- PT Re-evaluation, 97750- Physical Performance Testing, 97110-Therapeutic exercises, 97530- Therapeutic activity, 97112- Neuromuscular re-education, 97535- Self Care, [id number]- Manual therapy, 97116- Gait training, [phone]- Ultrasound, Patient/Family education, Balance training, Stair training, Joint mobilization, Joint manipulation, DME instructions, Cryotherapy, and Moist heat  PLAN FOR NEXT SESSION:   Continue UE strengthening and ROM Cardio through [facility].     Massie FORBES Dollar, PT 06/25/2024, 10:10 AM

## 2024-06-29 ENCOUNTER — Ambulatory Visit (AMBULATORY_SURGERY_CENTER): Admitting: Internal Medicine

## 2024-06-29 ENCOUNTER — Encounter: Payer: Self-pay | Admitting: Internal Medicine

## 2024-06-29 VITALS — BP 121/72 | HR 75 | Temp 97.2°F | Resp 16 | Ht 66.0 in | Wt 149.0 lb

## 2024-06-29 DIAGNOSIS — R140 Abdominal distension (gaseous): Secondary | ICD-10-CM | POA: Diagnosis not present

## 2024-06-29 DIAGNOSIS — R634 Abnormal weight loss: Secondary | ICD-10-CM | POA: Diagnosis not present

## 2024-06-29 DIAGNOSIS — I2510 Atherosclerotic heart disease of native coronary artery without angina pectoris: Secondary | ICD-10-CM | POA: Diagnosis not present

## 2024-06-29 DIAGNOSIS — R110 Nausea: Secondary | ICD-10-CM | POA: Diagnosis not present

## 2024-06-29 DIAGNOSIS — K3189 Other diseases of stomach and duodenum: Secondary | ICD-10-CM

## 2024-06-29 DIAGNOSIS — E785 Hyperlipidemia, unspecified: Secondary | ICD-10-CM | POA: Diagnosis not present

## 2024-06-29 DIAGNOSIS — D649 Anemia, unspecified: Secondary | ICD-10-CM

## 2024-06-29 MED ORDER — SODIUM CHLORIDE 0.9 % IV SOLN: 500.0000 mL | Freq: Once | INTRAVENOUS | Status: DC

## 2024-06-29 MED ORDER — PANTOPRAZOLE SODIUM 40 MG PO TBEC: 40.0000 mg | DELAYED_RELEASE_TABLET | Freq: Every day | ORAL | 3 refills | Status: AC

## 2024-06-29 NOTE — Progress Notes (Signed)
 Pt resting comfortably. VSS. Airway intact. SBAR complete to RN. All questions answered.

## 2024-06-29 NOTE — Progress Notes (Signed)
 Called to room to assist during endoscopic procedure.  Patient ID and intended procedure confirmed with present staff. Received instructions for my participation in the procedure from the performing physician.

## 2024-06-29 NOTE — Op Note (Signed)
 [facility] Patient Name: Jessica [name] Procedure Date: 06/29/2024 2:42 PM MRN: [id number] Endoscopist: Rosario Estefana Kidney , , [id number] Age: 73 Referring MD:  Date of Birth: 1951/07/28 Gender: Female Account #: [id number] Procedure:                Upper GI endoscopy Indications:              Nausea, Weight loss Medicines:                Monitored Anesthesia Care Procedure:                Pre-Anesthesia Assessment:                           - Prior to the procedure, a History and Physical                            was performed, and patient medications and                            allergies were reviewed. The patient's tolerance of                            previous anesthesia was also reviewed. The risks                            and benefits of the procedure and the sedation                            options and risks were discussed with the patient.                            All questions were answered, and informed consent                            was obtained. Prior Anticoagulants: The patient has                            taken no anticoagulant or antiplatelet agents. ASA                            Grade Assessment: III - A patient with severe                            systemic disease. After reviewing the risks and                            benefits, the patient was deemed in satisfactory                            condition to undergo the procedure.                           After obtaining informed consent, the endoscope was  passed under direct vision. Throughout the                            procedure, the patient's blood pressure, pulse, and                            oxygen saturations were monitored continuously. The                            GIF [id number] #[id number] was introduced through the                            mouth, and advanced to the second part of duodenum.                            The upper GI endoscopy was  accomplished without                            difficulty. The patient tolerated the procedure                            well. Scope In: Scope Out: Findings:                 The examined esophagus was normal.                           Localized mildly erythematous mucosa without                            bleeding was found in the gastric antrum. Biopsies                            were taken with a cold forceps for histology.                           The examined duodenum was normal. Biopsies were                            taken with a cold forceps for histology. Complications:            No immediate complications. Estimated Blood Loss:     Estimated blood loss was minimal. Impression:               - Normal esophagus.                           - Erythematous mucosa in the antrum. Biopsied.                           - Normal examined duodenum. Biopsied. Recommendation:           - Discharge patient to home (with escort).                           - Await pathology results.                           -  Start pantoprazole  40 mg daily.                           - Return to GI clinic in 2-3 months.                           - The findings and recommendations were discussed                            with the patient. Dr Estefana Federico Rosario Estefana Federico,  06/29/2024 3:12:37 PM

## 2024-06-29 NOTE — Progress Notes (Signed)
 GASTROENTEROLOGY PROCEDURE H&P NOTE   Primary Care Physician: Rollene Almarie LABOR, MD    Reason for Procedure:   Weight loss, bloating, nausea  Plan:    EGD  Patient is appropriate for endoscopic procedure(s) in the ambulatory ([facility]) setting.  The nature of the procedure, as well as the risks, benefits, and alternatives were carefully and thoroughly reviewed with the patient. Ample time for discussion and questions allowed. The patient understood, was satisfied, and agreed to proceed.     HPI: Jessica [name] is a 73 y.o. female who presents for EGD for evaluation of weight loss, bloating, and nausea.  Patient was most recently seen in the Gastroenterology Clinic on 06/02/24.  No interval change in medical history since that appointment. Please refer to that note for full details regarding GI history and clinical presentation.   Past Medical History:  Diagnosis Date   Allergy    Breast cancer (HCC) 2000   Cancer (HCC)    breast   Colon polyp    Coronary artery disease    GERD (gastroesophageal reflux disease)    History of blood transfusion    Hx of CABG    3/20   Hyperlipidemia    Migraine    history of migrarines, none as an adult   Personal history of radiation therapy 2000   Rheumatoid arthritis (HCC) 2023    Past Surgical History:  Procedure Laterality Date   ABDOMINAL HYSTERECTOMY     BREAST LUMPECTOMY Right 2000   BREAST SURGERY Right    calcifaction removed   BUNIONECTOMY Bilateral    BUNIONECTOMY Bilateral    COLONOSCOPY     COLONOSCOPY W/ POLYPECTOMY     CORONARY ARTERY BYPASS GRAFT N/A 01/05/2019   Procedure: CORONARY ARTERY BYPASS GRAFTING (CABG), using right leg saphenous endoscopic and open vein harvest, exploration left leg;  Surgeon: Lucas Dorise POUR, MD;  Location: [facility] OR;  Service: Open Heart Surgery;  Laterality: N/A;   EYE SURGERY Bilateral    cataract   hemorrhoid     LEFT HEART CATH AND CORONARY ANGIOGRAPHY N/A 01/01/2019    Procedure: LEFT HEART CATH AND CORONARY ANGIOGRAPHY;  Surgeon: Dann Candyce RAMAN, MD;  Location: [facility] INVASIVE CV LAB;  Service: Cardiovascular;  Laterality: N/A;   STERNAL WIRES REMOVAL N/A 05/11/2019   Procedure: STERNAL WIRES REMOVAL;  Surgeon: Lucas Dorise POUR, MD;  Location: [facility] OR;  Service: Thoracic;  Laterality: N/A;   TEE WITHOUT CARDIOVERSION N/A 01/05/2019   Procedure: TRANSESOPHAGEAL ECHOCARDIOGRAM (TEE);  Surgeon: Lucas Dorise POUR, MD;  Location: [facility] OR;  Service: Open Heart Surgery;  Laterality: N/A;    Prior to Admission medications   Medication Sig Start Date End Date Taking? Authorizing Provider  Alpha-D-Galactosidase (ECK FOOD ENZYME PO) Take by mouth. 1 tablet po every day    [provider]  Ascorbic Acid (VITAMIN C) 1000 MG tablet Take 2,000 mg by mouth daily.    [provider]  aspirin  EC 81 MG tablet Take 1 tablet (81 mg total) by mouth daily. Swallow whole. 05/06/23   Gollan, Timothy J, MD  Cholecalciferol (VITAMIN D -3) 125 MCG (5000 UT) TABS Take 5,000 Units by mouth daily.    [provider]  linaclotide  (LINZESS ) 72 MCG capsule Take 1 capsule (72 mcg total) by mouth daily before breakfast. 06/02/24 05/28/25  Alyah Boehning C, MD  loratadine (CLARITIN) 10 MG tablet Take 10 mg by mouth every evening.     [provider]  Magnesium  Gluconate 550 MG TABS  [provider]  methylPREDNISolone  (MEDROL  DOSEPAK) 4 MG TBPK tablet Take as directed on the box 06/01/24   Jerrol Agent, MD  Multiple Vitamin (MULTIVITAMIN WITH MINERALS) TABS tablet Take 1 tablet by mouth daily.    [provider]  Omega-3 Fatty Acids (OMEGA-3 EPA FISH OIL PO) Take by mouth. 1 capsule po every day    [provider]  ondansetron  (ZOFRAN ) 4 MG tablet Take 1 tablet (4 mg total) by mouth every 8 (eight) hours as needed for nausea or vomiting. 06/01/24   Jerrol Agent, MD  oxyCODONE -acetaminophen  (PERCOCET/ROXICET) 5-325 MG tablet Take 1 tablet by  mouth every 6 (six) hours as needed for severe pain (pain score 7-10). 06/01/24   Jerrol Agent, MD  predniSONE  (DELTASONE ) 10 MG tablet Take 10 mg by mouth as directed. PRN with flareups    [provider]  Upadacitinib ER (RINVOQ) 15 MG TB24 1 tablet Orally Once a day; Duration: 30 days 01/30/24   [provider]    Current Outpatient Medications  Medication Sig Dispense Refill   Alpha-D-Galactosidase (ECK FOOD ENZYME PO) Take by mouth. 1 tablet po every day     Ascorbic Acid (VITAMIN C) 1000 MG tablet Take 2,000 mg by mouth daily.     aspirin  EC 81 MG tablet Take 1 tablet (81 mg total) by mouth daily. Swallow whole.     Cholecalciferol (VITAMIN D -3) 125 MCG (5000 UT) TABS Take 5,000 Units by mouth daily.     linaclotide  (LINZESS ) 72 MCG capsule Take 1 capsule (72 mcg total) by mouth daily before breakfast. 30 capsule 3   loratadine (CLARITIN) 10 MG tablet Take 10 mg by mouth every evening.      Magnesium  Gluconate 550 MG TABS      methylPREDNISolone  (MEDROL  DOSEPAK) 4 MG TBPK tablet Take as directed on the box 1 each 0   Multiple Vitamin (MULTIVITAMIN WITH MINERALS) TABS tablet Take 1 tablet by mouth daily.     Omega-3 Fatty Acids (OMEGA-3 EPA FISH OIL PO) Take by mouth. 1 capsule po every day     ondansetron  (ZOFRAN ) 4 MG tablet Take 1 tablet (4 mg total) by mouth every 8 (eight) hours as needed for nausea or vomiting. 12 tablet 0   oxyCODONE -acetaminophen  (PERCOCET/ROXICET) 5-325 MG tablet Take 1 tablet by mouth every 6 (six) hours as needed for severe pain (pain score 7-10). 15 tablet 0   predniSONE  (DELTASONE ) 10 MG tablet Take 10 mg by mouth as directed. PRN with flareups     Upadacitinib ER (RINVOQ) 15 MG TB24 1 tablet Orally Once a day; Duration: 30 days     Current Facility-Administered Medications  Medication Dose Route Frequency Provider Last Rate Last Admin   0.9 %  sodium chloride  infusion  500 mL Intravenous Once Tayvion Lauder C, MD        Allergies as of  06/29/2024 - Review Complete 06/29/2024  Allergen Reaction Noted   Codeine Nausea And Vomiting 04/01/2015   Evolocumab  Other (See Comments) 09/21/2020   Sulfa antibiotics Swelling 04/01/2015   Sulfacetamide Swelling 08/13/2016    Family History  Problem Relation Age of Onset   Heart attack Father 68   Heart disease Brother    Asthma Paternal Aunt    Diabetes Paternal Aunt    Stroke Paternal Uncle    Kidney disease Paternal Uncle    Colon cancer Neg Hx    Esophageal cancer Neg Hx    Rectal cancer Neg Hx    Stomach cancer Neg  Hx    Colon polyps Neg Hx     Social History   Socioeconomic History   Marital status: Married    Spouse name: Lebron   Number of children: Not on file   Years of education: Not on file   Highest education level: Master's degree (e.g., MA, MS, MEng, MEd, MSW, MBA)  Occupational History   Occupation: RETIRED  Tobacco Use   Smoking status: Never   Smokeless tobacco: Never  Vaping Use   Vaping status: Never Used  Substance and Sexual Activity   Alcohol use: No   Drug use: No   Sexual activity: Yes  Other Topics Concern   Not on file  Social History Narrative   Lives with husband/2025   Social Drivers of Health   Financial Resource Strain: Low Risk  (04/08/2024)   Overall Financial Resource Strain (CARDIA)    Difficulty of Paying Living Expenses: Not hard at all  Food Insecurity: No Food Insecurity (06/18/2024)   Hunger Vital Sign    Worried About Running Out of Food in the Last Year: Never true    Ran Out of Food in the Last Year: Never true  Transportation Needs: No Transportation Needs (06/18/2024)   PRAPARE - Administrator, Civil Service (Medical): No    Lack of Transportation (Non-Medical): No  Physical Activity: Insufficiently Active (04/08/2024)   Exercise Vital Sign    Days of Exercise per Week: 3 days    Minutes of Exercise per Session: 30 min  Stress: No Stress Concern Present (04/08/2024)   [employer] of  Occupational Health - Occupational Stress Questionnaire    Feeling of Stress: Not at all  Social Connections: Socially Integrated (04/08/2024)   Social Connection and Isolation Panel    Frequency of Communication with Friends and Family: Twice a week    Frequency of Social Gatherings with Friends and Family: Once a week    Attends Religious Services: More than 4 times per year    Active Member of [employer] or Organizations: Yes    Attends Banker Meetings: 1 to 4 times per year    Marital Status: Married  Catering manager Violence: Not At Risk (06/18/2024)   Humiliation, Afraid, Rape, and Kick questionnaire    Fear of Current or Ex-Partner: No    Emotionally Abused: No    Physically Abused: No    Sexually Abused: No    Physical Exam: Vital signs in last 24 hours: BP 129/70   Pulse 61   Temp (!) 97.2 F (36.2 C)   Ht 5' 6 (1.676 m)   Wt 149 lb (67.6 kg)   SpO2 100%   BMI 24.05 kg/m  GEN: NAD EYE: Sclerae anicteric ENT: MMM CV: Non-tachycardic Pulm: No increased WOB GI: Soft NEURO:  Alert & Oriented   Estefana Kidney, MD [facility]   06/29/2024 2:17 PM

## 2024-06-29 NOTE — Progress Notes (Signed)
 Pt's states no medical or surgical changes since previsit or office visit.

## 2024-06-29 NOTE — Patient Instructions (Addendum)
 Await pathology results. Start pantoprazole  40 mg daily.  Take 30 minutes before first meal of the day Return to GI clinic in 2-3 months.   YOU HAD AN ENDOSCOPIC PROCEDURE TODAY AT THE [facility]:   Refer to the procedure report that was given to you for any specific questions about what was found during the examination.  If the procedure report does not answer your questions, please call your gastroenterologist to clarify.  If you requested that your care partner not be given the details of your procedure findings, then the procedure report has been included in a sealed envelope for you to review at your convenience later.  YOU SHOULD EXPECT: Some feelings of bloating in the abdomen. Passage of more gas than usual.  Walking can help get rid of the air that was put into your GI tract during the procedure and reduce the bloating.  Please Note:  You might notice some irritation and congestion in your nose or some drainage.  This is from the oxygen used during your procedure.  There is no need for concern and it should clear up in a day or so.  SYMPTOMS TO REPORT IMMEDIATELY:  Following upper endoscopy (EGD)  Vomiting of blood or coffee ground material  New chest pain or pain under the shoulder blades  Painful or persistently difficult swallowing  New shortness of breath  Fever of 100F or higher  Black, tarry-looking stools  For urgent or emergent issues, a gastroenterologist can be reached at any hour by calling [phone]. Do not use MyChart messaging for urgent concerns.    DIET:  We do recommend a small meal at first, but then you may proceed to your regular diet.  Drink plenty of fluids but you should avoid alcoholic beverages for 24 hours.  ACTIVITY:  You should plan to take it easy for the rest of today and you should NOT DRIVE or use heavy machinery until tomorrow (because of the sedation medicines used during the test).    FOLLOW UP: Our staff will call the  number listed on your records the next business day following your procedure.  We will call around 7:15- 8:00 am to check on you and address any questions or concerns that you may have regarding the information given to you following your procedure. If we do not reach you, we will leave a message.     If any biopsies were taken you will be contacted by phone or by letter within the next 1-3 weeks.  Please call us  at [phone] if you have not heard about the biopsies in 3 weeks.    SIGNATURES/CONFIDENTIALITY: You and/or your care partner have signed paperwork which will be entered into your electronic medical record.  These signatures attest to the fact that that the information above on your After Visit Summary has been reviewed and is understood.  Full responsibility of the confidentiality of this discharge information lies with you and/or your care-partner.

## 2024-06-30 ENCOUNTER — Telehealth: Payer: Self-pay | Admitting: Lactation Services

## 2024-06-30 NOTE — Telephone Encounter (Signed)
  Follow up Call-     06/29/2024    2:13 PM 04/02/2024    8:02 AM 03/20/2024    7:13 AM  Call back number  Post procedure Call Back phone  # [phone]  Permission to leave phone message Yes Yes Yes     Patient questions:  Do you have a fever, pain , or abdominal swelling? No. Pain Score  0 *  Have you tolerated food without any problems? Yes.    Have you been able to return to your normal activities? Yes.    Do you have any questions about your discharge instructions: Diet   No. Medications  No. Follow up visit  No.  Do you have questions or concerns about your Care? No.  Actions: * If pain score is 4 or above: No action needed, pain <4.

## 2024-07-02 ENCOUNTER — Ambulatory Visit: Admitting: Physical Therapy

## 2024-07-02 ENCOUNTER — Ambulatory Visit: Payer: Self-pay | Admitting: Internal Medicine

## 2024-07-02 ENCOUNTER — Encounter: Payer: Self-pay | Admitting: Internal Medicine

## 2024-07-02 DIAGNOSIS — M25511 Pain in right shoulder: Secondary | ICD-10-CM

## 2024-07-02 DIAGNOSIS — M79604 Pain in right leg: Secondary | ICD-10-CM

## 2024-07-02 DIAGNOSIS — M25642 Stiffness of left hand, not elsewhere classified: Secondary | ICD-10-CM

## 2024-07-02 DIAGNOSIS — M79642 Pain in left hand: Secondary | ICD-10-CM | POA: Diagnosis not present

## 2024-07-02 DIAGNOSIS — M6281 Muscle weakness (generalized): Secondary | ICD-10-CM | POA: Diagnosis not present

## 2024-07-02 DIAGNOSIS — M0579 Rheumatoid arthritis with rheumatoid factor of multiple sites without organ or systems involvement: Secondary | ICD-10-CM

## 2024-07-02 DIAGNOSIS — M542 Cervicalgia: Secondary | ICD-10-CM | POA: Diagnosis not present

## 2024-07-02 LAB — SURGICAL PATHOLOGY

## 2024-07-02 NOTE — Therapy (Signed)
 OUTPATIENT PHYSICAL THERAPY Orthopedic Treatment   Patient Name: Jessica [name] MRN: [id number] DOB:07-Mar-1951, 73 y.o., female Today's Date: 07/02/2024  END OF SESSION:  PT End of Session - 07/02/24 1626     Visit Number 3    Number of Visits 9    Date for PT Re-Evaluation 07/17/24    PT Start Time 1626    PT Stop Time 1704    PT Time Calculation (min) 38 min    Equipment Utilized During Treatment Gait belt    Activity Tolerance Patient tolerated treatment well    Behavior During Therapy WFL for tasks assessed/performed          Past Medical History:  Diagnosis Date   Allergy    Breast cancer (HCC) 2000   Cancer (HCC)    breast   Colon polyp    Coronary artery disease    GERD (gastroesophageal reflux disease)    History of blood transfusion    Hx of CABG    3/20   Hyperlipidemia    Migraine    history of migrarines, none as an adult   Personal history of radiation therapy 2000   Rheumatoid arthritis (HCC) 2023   Past Surgical History:  Procedure Laterality Date   ABDOMINAL HYSTERECTOMY     BREAST LUMPECTOMY Right 2000   BREAST SURGERY Right    calcifaction removed   BUNIONECTOMY Bilateral    BUNIONECTOMY Bilateral    COLONOSCOPY     COLONOSCOPY W/ POLYPECTOMY     CORONARY ARTERY BYPASS GRAFT N/A 01/05/2019   Procedure: CORONARY ARTERY BYPASS GRAFTING (CABG), using right leg saphenous endoscopic and open vein harvest, exploration left leg;  Surgeon: Lucas Dorise POUR, MD;  Location: [facility] OR;  Service: Open Heart Surgery;  Laterality: N/A;   EYE SURGERY Bilateral    cataract   hemorrhoid     LEFT HEART CATH AND CORONARY ANGIOGRAPHY N/A 01/01/2019   Procedure: LEFT HEART CATH AND CORONARY ANGIOGRAPHY;  Surgeon: Dann Candyce RAMAN, MD;  Location: [facility] INVASIVE CV LAB;  Service: Cardiovascular;  Laterality: N/A;   STERNAL WIRES REMOVAL N/A 05/11/2019   Procedure: STERNAL WIRES REMOVAL;  Surgeon: Lucas Dorise POUR, MD;  Location: [facility] OR;  Service: Thoracic;   Laterality: N/A;   TEE WITHOUT CARDIOVERSION N/A 01/05/2019   Procedure: TRANSESOPHAGEAL ECHOCARDIOGRAM (TEE);  Surgeon: Lucas Dorise POUR, MD;  Location: [facility];  Service: Open Heart Surgery;  Laterality: N/A;   Patient Active Problem List   Diagnosis Date Noted   Aneurysm of ascending aorta without rupture (HCC) 05/05/2024   Normocytic anemia 01/31/2024   Nausea 11/21/2023   Abdominal pain 11/21/2023   Constipation 11/21/2023   Weight loss 04/10/2023   HTN (hypertension) 08/14/2022   CAD (coronary artery disease) 08/14/2022   Pre-diabetes 07/25/2022   Pain due to onychomycosis of toenails of both feet 07/12/2022   Rheumatoid arthritis involving multiple sites (HCC) 03/08/2022   Insomnia 03/08/2022   Dysphagia 10/31/2021   Lymphadenopathy of head and neck 06/16/2021   Long-term use of immunosuppressant medication 05/31/2021   Fatigue 01/27/2021   Chronic pain of both shoulders 06/01/2020   Leg swelling 06/01/2020   Cervical disc disorder with radiculopathy of cervical region 05/24/2020   Post inflammatory hypopigmentation 12/30/2019   Claudication in peripheral vascular disease (HCC) 12/22/2019   Arthritis of carpometacarpal (CMC) joint of left thumb 10/28/2019   Hand arthritis 10/01/2019   Statin myopathy 08/30/2019   SOB (shortness of breath) on exertion 06/19/2019   Hyperlipidemia 04/08/2019   S/P  CABG x 5 01/05/2019   Left leg pain 09/30/2018   Degenerative joint disease of low back 01/09/2017   Routine general medical examination at a health care facility 10/02/2016   GERD (gastroesophageal reflux disease) 07/28/2016   H/O malignant neoplasm of breast 06/19/2016    PCP:    Rollene Almarie LABOR, MD    REFERRING PROVIDER:    Rollene Almarie LABOR, MD    REFERRING DIAG:  M47.9 (ICD-10-CM) - Degenerative joint disease of low back  R53.81 (ICD-10-CM) - Physical deconditioning    THERAPY DIAG:  Muscle weakness (generalized)  Right shoulder pain, unspecified  chronicity  Pain in right leg  Neck pain on right side  Pain in left hand  Stiffness of left hand, not elsewhere classified  Rationale for Evaluation and Treatment: Rehabilitation  ONSET DATE: 2-3 months   SUBJECTIVE:   SUBJECTIVE STATEMENT:  Pt states that she is having pain in both feet today as well as arms, but the arms are feeling better today rating pain 4/10 and and 8/10 in Bil feet.  Reports that she reached out to rheumatology yesterday to notify them of continued increased pain muscles and joints.      Wants to start going back to the [employer]   FROM EVAL: Pt reports that she has been talking to MD about maintaining muscle mass. Reports that she has been in a lot of pain over the last few months in BUE and BLE as well as feet. States that she was told that it was Rheumatoid arthritis. Was seen in [facility] last week with increased knee pain; Reports that she had fluid taken from knee last Monday due to pain and swelling; no issues since then.   Reports that she was using the [facility] and [employer] for [employer] prior to a couple months ago   Per pt she was prescribed PT for 2 times per week for one month for Generalized strengthening, so that she can get back in the gym   PERTINENT HISTORY: Hx of RA, AAA, Anemia, sleep disturbances, and recent muscle weakness with weight loss.    PAIN:  Are you having pain? Yes: NPRS scale: 3/10 Pain location: R UE in the muscle Pain description: aches  Aggravating factors: movement.  Relieving factors: tylenol    PRECAUTIONS: None  RED FLAGS: Abdominal aneurysm: Yes: diagnosed last year. CT images have been ordered to    WEIGHT BEARING RESTRICTIONS: No  FALLS:  Has patient fallen in last 6 months? No  LIVING ENVIRONMENT: Lives with: lives with their spouse Lives in: House/apartment Stairs: Yes: Internal: flight steps; on left going up and External: 5 steps; on right going up, on left going up, and can reach  both Has following equipment at home: None  OCCUPATION: retired   PLOF: Independent  PATIENT GOALS: improved strength and confidence with activity   NEXT MD VISIT: none with PCP. Will be having cardiac hematology consult in the next few weekes.   OBJECTIVE:  Note: Objective measures were completed at Evaluation unless otherwise noted.  DIAGNOSTIC FINDINGS:  DG Knee  IMPRESSION: Suprapatellar joint effusion .  Correlate with clinical findings.   Mild degenerative changes of the knee joint.   No acute osseous abnormality.  MR R foot  IMPRESSION: 1. Periarticular marrow edema throughout the midfoot which may reflect stress reaction, early Charcot arthropathy, CRPS versus underlying inflammatory arthropathy. 2. Severe osteoarthritis of the first MTP joint. 3. Moderate osteoarthritis of the second TMT joint. 4. Mild osteoarthritis of the second  MTP joint.  MR L foot   IMPRESSION: 1. Periarticular marrow edema throughout the midfoot which may reflect stress reaction, early Charcot arthropathy, CRPS versus underlying inflammatory arthropathy. 2. Mild marrow edema along the plantar aspect of the second and third metatarsal heads likely reflecting mild stress reaction. 3. Severe osteoarthritis of the first MTP joint. 4. Moderate osteoarthritis of the second TMT joint. 5. Mild osteoarthritis of the second MTP joint.  PATIENT SURVEYS:  LEFS  Extreme difficulty/unable (0), Quite a bit of difficulty (1), Moderate difficulty (2), Little difficulty (3), No difficulty (4) Survey date:    Any of your usual work, housework or school activities   2. Usual hobbies, recreational or sporting activities   3. Getting into/out of the bath   4. Walking between rooms   5. Putting on socks/shoes   6. Squatting    7. Lifting an object, like a bag of groceries from the floor   8. Performing light activities around your home   9. Performing heavy activities around your home   10. Getting  into/out of a car   11. Walking 2 blocks   12. Walking 1 mile   13. Going up/down 10 stairs (1 flight)   14. Standing for 1 hour   15.  sitting for 1 hour   16. Running on even ground   17. Running on uneven ground   18. Making sharp turns while running fast   19. Hopping    20. Rolling over in bed   Score total:      UEFS  Extreme difficulty/unable (0), Quite a bit of difficulty (1), Moderate difficulty (2), Little difficulty (3), No difficulty (4) Survey date:    Any of your usual work, household or school activities   2. Your usual hobbies, recreational/sport activities    3. Lifting a bag of groceries to waist level    4. Lifting a bag of groceries above your head   5. Grooming your hair   6. Pushing up on your hands (I.e. from bathtub or chair)   7. Preparing food (I.e. peeling/cutting)   8. Driving    9. Vacuuming, sweeping, or raking   10. Dressing    11. Doing up buttons   12. Using tools/appliances   13. Opening doors   14. Cleaning    15. Tying or lacing shoes   16. Sleeping    17. Laundering clothes (I.e. washing, ironing, folding)   18. Opening a jar   19. Throwing a ball   20. Carrying a small suitcase with your affected limb.    Score total:      To be completed   COGNITION: Overall cognitive status: Within functional limits for tasks assessed     SENSATION: WFL  EDEMA:  WFL on this day   MUSCLE LENGTH:   POSTURE: rounded shoulders and forward head  PALPATION: Mild pain to lateral deltiod and lateral bicep muscle belly   LOWER EXTREMITY ROM:  Grossly WFL  UPPER EXTREMITY ROM:   Active ROM Right eval Left eval  Shoulder flexion 128 145  Shoulder extension    Shoulder abduction 130 139  Shoulder adduction    Shoulder internal rotation Sitting T11-12 Supine: 80  Sitting T6-7 Supine: 100  Shoulder external rotation Supine: 40 68  Elbow flexion    Elbow extension    Wrist flexion    Wrist extension    Wrist ulnar deviation     Wrist radial deviation    Wrist pronation  Wrist supination    (Blank rows = not tested)  LOWER EXTREMITY MMT:  MMT Right eval Left eval  Hip flexion 4- 4  Hip extension    Hip abduction 4- 4  Hip adduction 4 4  Hip internal rotation    Hip external rotation    Knee flexion 4- 4-  Knee extension 4+ 4+  Ankle dorsiflexion    Ankle plantarflexion    Ankle inversion    Ankle eversion     (Blank rows = not tested)    UPPER EXTREMITY MMT:  MMT Right eval Left eval  Shoulder flexion 4- 4  Shoulder extension 5 5  Shoulder abduction 4- 4  Shoulder adduction 4+ 4+  Shoulder internal rotation 4 4  Shoulder external rotation 4- 4  Middle trapezius    Lower trapezius    Elbow flexion 4+ 4+  Elbow extension 4+ 4+  Wrist flexion    Wrist extension    Wrist ulnar deviation    Wrist radial deviation    Wrist pronation    Wrist supination    Grip strength (lbs)    (Blank rows = not tested)  Grip strength:  R 45# L 41#    FUNCTIONAL TESTS:  5 times sit to stand: 12.98 sec   GAIT: Distance walked: 60 Assistive device utilized: None Level of assistance: Complete Independence Comments: mildly flexed poster and decreased step length                                                                                                                                 TREATMENT DATE: 06/11/2024 Nustep hill program x 10 min resistance 3-7 with cues for consistent SPM throughout varied resistance. No pain in Bil shoulders throughout and reports decreased foot pain upon completion.   Seated row with RTB x 12  LAQ x 12 RTB  Hip abduction RTB x 12 Shoulder ER sitting with RTB x 12  Bicep curl and isometric tricpe extension x 12 bil RTB  LAQ x 12 bil  HS curl x 12 bil   Standing on airex pad :  Reciprocal march x 15 Hip abduction x 12 reciprocal  Reciprocal hip extension x 12      PATIENT EDUCATION:  Education details: POC. Benefits of PT Goals. Encouraged to trail  gym based work out with emphasis on cardio with light UE strengthening  Person educated: Patient Education method: Explanation, Demonstration, and Handouts Education comprehension: verbalized understanding and returned demonstration  HOME EXERCISE PROGRAM: Access Code: [email or web address] URL: [email or web address] Date: 06/12/2024 Prepared by: Massie Dollar  Exercises - Isometric Shoulder Extension at Wall  - 1 x daily - 7 x weekly - 3 sets - 10 reps - 2-3 sec  hold - Standing Shoulder Flexion Wall Walk  - 1 x daily - 7 x weekly - 3 sets - 10 reps - 3-5 sec  hold - Isometric Shoulder Flexion at Wall  - 1 x daily -  7 x weekly - 3 sets - 10 reps  Access Code: [email or web address] URL: [email or web address] Date: 07/02/2024 Prepared by: Massie Dollar  Exercises - Shoulder External Rotation and Scapular Retraction with Resistance  - 1 x daily - 7 x weekly - 3 sets - 10 reps - Standing Shoulder Row with Anchored Resistance  - 1 x daily - 7 x weekly - 3 sets - 10 reps - Standing Single Arm Elbow Flexion with Resistance  - 1 x daily - 7 x weekly - 3 sets - 10 reps - Standing March with Counter Support  - 1 x daily - 7 x weekly - 3 sets - 10 reps - Standing Hip Abduction with Counter Support  - 1 x daily - 7 x weekly - 3 sets - 10 reps - Standing Hip Extension with Counter Support  - 1 x daily - 7 x weekly - 3 sets - 10 reps - Mini Squat with Counter Support  - 1 x daily - 7 x weekly - 3 sets - 10 reps  ASSESSMENT:  CLINICAL IMPRESSION: Patient is a 73 y.o. female who was seen today for physical therapy  treatment for deconditioning. Pt demonstrates BLE and BUE strength deficits with hx of RA flare up and AAA. Due to pain since last PT treatment, pt has not looking into starting gym program yet. Continued to advance BLE and BUE HEP to improve joint mobility and strengthening. Due to strength, ROM and functional deficits, pt will benefit form SKilled PT to improve overall QoL and allow  return to PLOF including use of community fitness center.   OBJECTIVE IMPAIRMENTS: cardiopulmonary status limiting activity, decreased activity tolerance, decreased endurance, decreased knowledge of condition, difficulty walking, decreased ROM, decreased strength, hypomobility, increased fascial restrictions, impaired flexibility, impaired UE functional use, improper body mechanics, and pain.   ACTIVITY LIMITATIONS: carrying, lifting, standing, squatting, stairs, and locomotion level  PARTICIPATION LIMITATIONS: cleaning, laundry, shopping, and community activity  PERSONAL FACTORS: Age, Fitness, Time since onset of injury/illness/exacerbation, and 3+ comorbidities: RA, AAA, Anemia are also affecting patient's functional outcome.   REHAB POTENTIAL: Excellent  CLINICAL DECISION MAKING: Evolving/moderate complexity  EVALUATION COMPLEXITY: Moderate   GOALS: Goals reviewed with patient? Yes  SHORT TERM GOALS: Target date: 07/10/2024    Patient will be independent in home exercise program to improve strength/mobility for better functional independence with ADLs. Baseline: initiated 8/21 Goal status: INITIAL   LONG TERM GOALS: Target date: 07/17/2024    Patient will increase LEFS and Quick dash by 5 points each s to demonstratebetter functional mobility and better confidence with ADLs.  Baseline: 22.7% quick dash  Goal status: INITIAL  2.  Patient (> 19 years old) will complete five times sit to stand test in < 10 seconds indicating an increased LE strength and improved balance. Baseline: 12.9sec Goal status: INITIAL  3.  Patient will improve R shoulder strength to least  4/5 to improve independence with ADLs including shopping and dressing  Baseline: 4-/5 with pain  Goal status: INITIAL  4.  Patient will increase R shoulder ROM with IR to at least T 8 to allow improved independence with dressing and bathing tasks.  Baseline: T12 Goal status: INITIAL  5.  Patient will increase  BLE strength to at least 4/5 to improve safety and independence with stair management and allow return to community fitness center  Baseline: 4-/5  Goal status: INITIAL     PLAN:  PT FREQUENCY: 1-2x/week  PT DURATION: 4 weeks  PLANNED INTERVENTIONS: [id number]- PT  Re-evaluation, 97750- Physical Performance Testing, 97110-Therapeutic exercises, 97530- Therapeutic activity, [id number]- Neuromuscular re-education, 97535- Self Care, [id number]- Manual therapy, [phone]- Gait training, [phone]- Ultrasound, Patient/Family education, Balance training, Stair training, Joint mobilization, Joint manipulation, DME instructions, Cryotherapy, and Moist heat  PLAN FOR NEXT SESSION:   Continue UE strengthening and ROM Cardio through [facility].    Massie FORBES Dollar, PT 07/02/2024, 4:27 PM

## 2024-07-06 ENCOUNTER — Ambulatory Visit: Admitting: Physical Therapy

## 2024-07-06 DIAGNOSIS — M25511 Pain in right shoulder: Secondary | ICD-10-CM | POA: Diagnosis not present

## 2024-07-06 DIAGNOSIS — M6281 Muscle weakness (generalized): Secondary | ICD-10-CM

## 2024-07-06 DIAGNOSIS — M542 Cervicalgia: Secondary | ICD-10-CM | POA: Diagnosis not present

## 2024-07-06 DIAGNOSIS — M79604 Pain in right leg: Secondary | ICD-10-CM

## 2024-07-06 DIAGNOSIS — M25642 Stiffness of left hand, not elsewhere classified: Secondary | ICD-10-CM | POA: Diagnosis not present

## 2024-07-06 DIAGNOSIS — M79642 Pain in left hand: Secondary | ICD-10-CM | POA: Diagnosis not present

## 2024-07-06 NOTE — Therapy (Signed)
 OUTPATIENT PHYSICAL THERAPY Orthopedic Treatment   Patient Name: Jessica [name] MRN: [id number] DOB:1951/08/28, 73 y.o., female Today's Date: 07/06/2024  END OF SESSION:  PT End of Session - 07/06/24 0742     Visit Number 4    Number of Visits 9    Date for PT Re-Evaluation 07/17/24    PT Start Time 0800    PT Stop Time 0840    PT Time Calculation (min) 40 min    Equipment Utilized During Treatment Gait belt    Activity Tolerance Patient tolerated treatment well    Behavior During Therapy WFL for tasks assessed/performed          Past Medical History:  Diagnosis Date   Allergy    Breast cancer (HCC) 2000   Cancer (HCC)    breast   Colon polyp    Coronary artery disease    GERD (gastroesophageal reflux disease)    History of blood transfusion    Hx of CABG    3/20   Hyperlipidemia    Migraine    history of migrarines, none as an adult   Personal history of radiation therapy 2000   Rheumatoid arthritis (HCC) 2023   Past Surgical History:  Procedure Laterality Date   ABDOMINAL HYSTERECTOMY     BREAST LUMPECTOMY Right 2000   BREAST SURGERY Right    calcifaction removed   BUNIONECTOMY Bilateral    BUNIONECTOMY Bilateral    COLONOSCOPY     COLONOSCOPY W/ POLYPECTOMY     CORONARY ARTERY BYPASS GRAFT N/A 01/05/2019   Procedure: CORONARY ARTERY BYPASS GRAFTING (CABG), using right leg saphenous endoscopic and open vein harvest, exploration left leg;  Surgeon: Lucas Dorise POUR, MD;  Location: [facility] OR;  Service: Open Heart Surgery;  Laterality: N/A;   EYE SURGERY Bilateral    cataract   hemorrhoid     LEFT HEART CATH AND CORONARY ANGIOGRAPHY N/A 01/01/2019   Procedure: LEFT HEART CATH AND CORONARY ANGIOGRAPHY;  Surgeon: Dann Candyce RAMAN, MD;  Location: [facility] INVASIVE CV LAB;  Service: Cardiovascular;  Laterality: N/A;   STERNAL WIRES REMOVAL N/A 05/11/2019   Procedure: STERNAL WIRES REMOVAL;  Surgeon: Lucas Dorise POUR, MD;  Location: [facility] OR;  Service: Thoracic;   Laterality: N/A;   TEE WITHOUT CARDIOVERSION N/A 01/05/2019   Procedure: TRANSESOPHAGEAL ECHOCARDIOGRAM (TEE);  Surgeon: Lucas Dorise POUR, MD;  Location: [facility] OR;  Service: Open Heart Surgery;  Laterality: N/A;   Patient Active Problem List   Diagnosis Date Noted   Aneurysm of ascending aorta without rupture (HCC) 05/05/2024   Normocytic anemia 01/31/2024   Nausea 11/21/2023   Abdominal pain 11/21/2023   Constipation 11/21/2023   Weight loss 04/10/2023   HTN (hypertension) 08/14/2022   CAD (coronary artery disease) 08/14/2022   Pre-diabetes 07/25/2022   Pain due to onychomycosis of toenails of both feet 07/12/2022   Rheumatoid arthritis involving multiple sites (HCC) 03/08/2022   Insomnia 03/08/2022   Dysphagia 10/31/2021   Lymphadenopathy of head and neck 06/16/2021   Long-term use of immunosuppressant medication 05/31/2021   Fatigue 01/27/2021   Chronic pain of both shoulders 06/01/2020   Leg swelling 06/01/2020   Cervical disc disorder with radiculopathy of cervical region 05/24/2020   Post inflammatory hypopigmentation 12/30/2019   Claudication in peripheral vascular disease (HCC) 12/22/2019   Arthritis of carpometacarpal (CMC) joint of left thumb 10/28/2019   Hand arthritis 10/01/2019   Statin myopathy 08/30/2019   SOB (shortness of breath) on exertion 06/19/2019   Hyperlipidemia 04/08/2019   S/P  CABG x 5 01/05/2019   Left leg pain 09/30/2018   Degenerative joint disease of low back 01/09/2017   Routine general medical examination at a health care facility 10/02/2016   GERD (gastroesophageal reflux disease) 07/28/2016   H/O malignant neoplasm of breast 06/19/2016    PCP:    Rollene Almarie LABOR, MD    REFERRING PROVIDER:    Rollene Almarie LABOR, MD    REFERRING DIAG:  M47.9 (ICD-10-CM) - Degenerative joint disease of low back  R53.81 (ICD-10-CM) - Physical deconditioning    THERAPY DIAG:  Muscle weakness (generalized)  Right shoulder pain, unspecified  chronicity  Pain in right leg  Neck pain on right side  Rationale for Evaluation and Treatment: Rehabilitation  ONSET DATE: 2-3 months   SUBJECTIVE:   SUBJECTIVE STATEMENT:  Pt reports that she is doing well today. Significantly less pain on this day. States that she spoke with MD last week, and was told to cease medication due to muscle soreness. Will try to get in-person appointment with MD to address medication regiment.    Wants to start going back to the [employer]   FROM EVAL: Pt reports that she has been talking to MD about maintaining muscle mass. Reports that she has been in a lot of pain over the last few months in BUE and BLE as well as feet. States that she was told that it was Rheumatoid arthritis. Was seen in [facility] last week with increased knee pain; Reports that she had fluid taken from knee last Monday due to pain and swelling; no issues since then.   Reports that she was using the [facility] and [employer] for [employer] prior to a couple months ago   Per pt she was prescribed PT for 2 times per week for one month for Generalized strengthening, so that she can get back in the gym   PERTINENT HISTORY: Hx of RA, AAA, Anemia, sleep disturbances, and recent muscle weakness with weight loss.    PAIN:  Are you having pain? Yes: NPRS scale: 3/10 Pain location: R UE in the muscle Pain description: aches  Aggravating factors: movement.  Relieving factors: tylenol    PRECAUTIONS: None  RED FLAGS: Abdominal aneurysm: Yes: diagnosed last year. CT images have been ordered to    WEIGHT BEARING RESTRICTIONS: No  FALLS:  Has patient fallen in last 6 months? No  LIVING ENVIRONMENT: Lives with: lives with their spouse Lives in: House/apartment Stairs: Yes: Internal: flight steps; on left going up and External: 5 steps; on right going up, on left going up, and can reach both Has following equipment at home: None  OCCUPATION: retired   PLOF:  Independent  PATIENT GOALS: improved strength and confidence with activity   NEXT MD VISIT: none with PCP. Will be having cardiac hematology consult in the next few weekes.   OBJECTIVE:  Note: Objective measures were completed at Evaluation unless otherwise noted.  DIAGNOSTIC FINDINGS:  DG Knee  IMPRESSION: Suprapatellar joint effusion .  Correlate with clinical findings.   Mild degenerative changes of the knee joint.   No acute osseous abnormality.  MR R foot  IMPRESSION: 1. Periarticular marrow edema throughout the midfoot which may reflect stress reaction, early Charcot arthropathy, CRPS versus underlying inflammatory arthropathy. 2. Severe osteoarthritis of the first MTP joint. 3. Moderate osteoarthritis of the second TMT joint. 4. Mild osteoarthritis of the second MTP joint.  MR L foot   IMPRESSION: 1. Periarticular marrow edema throughout the midfoot which may reflect stress reaction,  early Charcot arthropathy, CRPS versus underlying inflammatory arthropathy. 2. Mild marrow edema along the plantar aspect of the second and third metatarsal heads likely reflecting mild stress reaction. 3. Severe osteoarthritis of the first MTP joint. 4. Moderate osteoarthritis of the second TMT joint. 5. Mild osteoarthritis of the second MTP joint.  PATIENT SURVEYS:  LEFS  Extreme difficulty/unable (0), Quite a bit of difficulty (1), Moderate difficulty (2), Little difficulty (3), No difficulty (4) Survey date:    Any of your usual work, housework or school activities   2. Usual hobbies, recreational or sporting activities   3. Getting into/out of the bath   4. Walking between rooms   5. Putting on socks/shoes   6. Squatting    7. Lifting an object, like a bag of groceries from the floor   8. Performing light activities around your home   9. Performing heavy activities around your home   10. Getting into/out of a car   11. Walking 2 blocks   12. Walking 1 mile   13. Going  up/down 10 stairs (1 flight)   14. Standing for 1 hour   15.  sitting for 1 hour   16. Running on even ground   17. Running on uneven ground   18. Making sharp turns while running fast   19. Hopping    20. Rolling over in bed   Score total:      UEFS  Extreme difficulty/unable (0), Quite a bit of difficulty (1), Moderate difficulty (2), Little difficulty (3), No difficulty (4) Survey date:    Any of your usual work, household or school activities   2. Your usual hobbies, recreational/sport activities    3. Lifting a bag of groceries to waist level    4. Lifting a bag of groceries above your head   5. Grooming your hair   6. Pushing up on your hands (I.e. from bathtub or chair)   7. Preparing food (I.e. peeling/cutting)   8. Driving    9. Vacuuming, sweeping, or raking   10. Dressing    11. Doing up buttons   12. Using tools/appliances   13. Opening doors   14. Cleaning    15. Tying or lacing shoes   16. Sleeping    17. Laundering clothes (I.e. washing, ironing, folding)   18. Opening a jar   19. Throwing a ball   20. Carrying a small suitcase with your affected limb.    Score total:      To be completed   COGNITION: Overall cognitive status: Within functional limits for tasks assessed     SENSATION: WFL  EDEMA:  WFL on this day   MUSCLE LENGTH:   POSTURE: rounded shoulders and forward head  PALPATION: Mild pain to lateral deltiod and lateral bicep muscle belly   LOWER EXTREMITY ROM:  Grossly WFL  UPPER EXTREMITY ROM:   Active ROM Right eval Left eval  Shoulder flexion 128 145  Shoulder extension    Shoulder abduction 130 139  Shoulder adduction    Shoulder internal rotation Sitting T11-12 Supine: 80  Sitting T6-7 Supine: 100  Shoulder external rotation Supine: 40 68  Elbow flexion    Elbow extension    Wrist flexion    Wrist extension    Wrist ulnar deviation    Wrist radial deviation    Wrist pronation    Wrist supination    (Blank rows  = not tested)  LOWER EXTREMITY MMT:  MMT Right eval Left eval  Hip flexion 4- 4  Hip extension    Hip abduction 4- 4  Hip adduction 4 4  Hip internal rotation    Hip external rotation    Knee flexion 4- 4-  Knee extension 4+ 4+  Ankle dorsiflexion    Ankle plantarflexion    Ankle inversion    Ankle eversion     (Blank rows = not tested)    UPPER EXTREMITY MMT:  MMT Right eval Left eval  Shoulder flexion 4- 4  Shoulder extension 5 5  Shoulder abduction 4- 4  Shoulder adduction 4+ 4+  Shoulder internal rotation 4 4  Shoulder external rotation 4- 4  Middle trapezius    Lower trapezius    Elbow flexion 4+ 4+  Elbow extension 4+ 4+  Wrist flexion    Wrist extension    Wrist ulnar deviation    Wrist radial deviation    Wrist pronation    Wrist supination    Grip strength (lbs)    (Blank rows = not tested)  Grip strength:  R 45# L 41#    FUNCTIONAL TESTS:  5 times sit to stand: 12.98 sec   GAIT: Distance walked: 60 Assistive device utilized: None Level of assistance: Complete Independence Comments: mildly flexed poster and decreased step length                                                                                                                                 TREATMENT DATE: 06/11/2024 Nustep hill program x 8 min resistance 3-7 with cues for consistent SPM throughout varied resistance. No pain in Bil shoulders throughout and reports decreased foot pain upon completion.   Aerobic step up up 45 sec x 3 with light UE support Lateral aerobic step up 30 sec x 4 light UE support   High row 3 x 10-12 reps 25#.  Weighted gait 12.5# forward/reverse 4 laps x 2 bouts   PT provided multiple therapeutic rest breaks throughout session due to mild SOB and to allow recovery between bouts.    PATIENT EDUCATION:  Education details: POC. Benefits of PT Goals. Encouraged to trial gym based work out with emphasis on cardio with light UE strengthening  Person  educated: Patient Education method: Explanation, Demonstration, and Handouts Education comprehension: verbalized understanding and returned demonstration  HOME EXERCISE PROGRAM: Access Code: [email or web address] URL: [email or web address] Date: 06/12/2024 Prepared by: Massie Dollar  Exercises - Isometric Shoulder Extension at Wall  - 1 x daily - 7 x weekly - 3 sets - 10 reps - 2-3 sec  hold - Standing Shoulder Flexion Wall Walk  - 1 x daily - 7 x weekly - 3 sets - 10 reps - 3-5 sec  hold - Isometric Shoulder Flexion at Wall  - 1 x daily - 7 x weekly - 3 sets - 10 reps  Access Code: [email or web address] URL: [email or web address] Date: 07/02/2024 Prepared by: Massie Dollar  Exercises - Shoulder External Rotation and Scapular  Retraction with Resistance  - 1 x daily - 7 x weekly - 3 sets - 10 reps - Standing Shoulder Row with Anchored Resistance  - 1 x daily - 7 x weekly - 3 sets - 10 reps - Standing Single Arm Elbow Flexion with Resistance  - 1 x daily - 7 x weekly - 3 sets - 10 reps - Standing March with Counter Support  - 1 x daily - 7 x weekly - 3 sets - 10 reps - Standing Hip Abduction with Counter Support  - 1 x daily - 7 x weekly - 3 sets - 10 reps - Standing Hip Extension with Counter Support  - 1 x daily - 7 x weekly - 3 sets - 10 reps - Mini Squat with Counter Support  - 1 x daily - 7 x weekly - 3 sets - 10 reps  ASSESSMENT:  CLINICAL IMPRESSION: Patient is a 73 y.o. female who was seen today for physical therapy  treatment for deconditioning. Pt demonstrates BLE and BUE strength deficits with hx of RA flare up and AAA. Reduced pain on this day allows increased functional strengthening with dynamic mobility and resistance training.  Due to strength, ROM and functional deficits, pt will benefit form SKilled PT to improve overall QoL and allow return to PLOF including use of community fitness center.   OBJECTIVE IMPAIRMENTS: cardiopulmonary status limiting activity,  decreased activity tolerance, decreased endurance, decreased knowledge of condition, difficulty walking, decreased ROM, decreased strength, hypomobility, increased fascial restrictions, impaired flexibility, impaired UE functional use, improper body mechanics, and pain.   ACTIVITY LIMITATIONS: carrying, lifting, standing, squatting, stairs, and locomotion level  PARTICIPATION LIMITATIONS: cleaning, laundry, shopping, and community activity  PERSONAL FACTORS: Age, Fitness, Time since onset of injury/illness/exacerbation, and 3+ comorbidities: RA, AAA, Anemia are also affecting patient's functional outcome.   REHAB POTENTIAL: Excellent  CLINICAL DECISION MAKING: Evolving/moderate complexity  EVALUATION COMPLEXITY: Moderate   GOALS: Goals reviewed with patient? Yes  SHORT TERM GOALS: Target date: 07/10/2024    Patient will be independent in home exercise program to improve strength/mobility for better functional independence with ADLs. Baseline: initiated 8/21 Goal status: INITIAL   LONG TERM GOALS: Target date: 07/17/2024    Patient will increase LEFS and Quick dash by 5 points each s to demonstratebetter functional mobility and better confidence with ADLs.  Baseline: 22.7% quick dash  Goal status: INITIAL  2.  Patient (> 38 years old) will complete five times sit to stand test in < 10 seconds indicating an increased LE strength and improved balance. Baseline: 12.9sec Goal status: INITIAL  3.  Patient will improve R shoulder strength to least  4/5 to improve independence with ADLs including shopping and dressing  Baseline: 4-/5 with pain  Goal status: INITIAL  4.  Patient will increase R shoulder ROM with IR to at least T 8 to allow improved independence with dressing and bathing tasks.  Baseline: T12 Goal status: INITIAL  5.  Patient will increase BLE strength to at least 4/5 to improve safety and independence with stair management and allow return to community fitness  center  Baseline: 4-/5  Goal status: INITIAL     PLAN:  PT FREQUENCY: 1-2x/week  PT DURATION: 4 weeks  PLANNED INTERVENTIONS: 97164- PT Re-evaluation, 97750- Physical Performance Testing, 97110-Therapeutic exercises, 97530- Therapeutic activity, 97112- Neuromuscular re-education, 97535- Self Care, [id number]- Manual therapy, 97116- Gait training, [id number]- Ultrasound, Patient/Family education, Balance training, Stair training, Joint mobilization, Joint manipulation, DME instructions, Cryotherapy, and Moist heat  PLAN  FOR NEXT SESSION:   Continue UE strengthening and ROM Cardio through [facility].    Massie FORBES Dollar, PT 07/06/2024, 7:43 AM

## 2024-07-14 ENCOUNTER — Ambulatory Visit: Admitting: Physical Therapy

## 2024-07-16 ENCOUNTER — Encounter: Payer: Self-pay | Admitting: Oncology

## 2024-07-16 ENCOUNTER — Inpatient Hospital Stay: Attending: Oncology | Admitting: Oncology

## 2024-07-16 VITALS — BP 113/69 | HR 82 | Temp 98.8°F | Resp 18 | Wt 143.0 lb

## 2024-07-16 DIAGNOSIS — Z853 Personal history of malignant neoplasm of breast: Secondary | ICD-10-CM | POA: Diagnosis not present

## 2024-07-16 DIAGNOSIS — D509 Iron deficiency anemia, unspecified: Secondary | ICD-10-CM | POA: Diagnosis not present

## 2024-07-16 DIAGNOSIS — D472 Monoclonal gammopathy: Secondary | ICD-10-CM | POA: Insufficient documentation

## 2024-07-16 DIAGNOSIS — D649 Anemia, unspecified: Secondary | ICD-10-CM | POA: Diagnosis not present

## 2024-07-16 DIAGNOSIS — M0579 Rheumatoid arthritis with rheumatoid factor of multiple sites without organ or systems involvement: Secondary | ICD-10-CM | POA: Diagnosis not present

## 2024-07-16 DIAGNOSIS — R634 Abnormal weight loss: Secondary | ICD-10-CM | POA: Diagnosis not present

## 2024-07-16 DIAGNOSIS — M069 Rheumatoid arthritis, unspecified: Secondary | ICD-10-CM | POA: Diagnosis not present

## 2024-07-16 DIAGNOSIS — D638 Anemia in other chronic diseases classified elsewhere: Secondary | ICD-10-CM | POA: Diagnosis not present

## 2024-07-16 NOTE — Assessment & Plan Note (Addendum)
 Chronic anemia, multifactorial.  Iron deficiency anemia, anemia due to chronic disease. Lab Results  Component Value Date   HGB 10.4 (L) 06/18/2024   TIBC 329 06/18/2024   IRONPCTSAT 12 06/18/2024   FERRITIN 322 (H) 06/18/2024    Patient has increased soluble transferrin receptor, indicating iron deficiency. I discussed about option of oral iron supplementation versus IV iron treatments.  She is not interested in IV iron treatments.  Recommend patient to take over-the-counter Vitron-C 1 tablet daily.

## 2024-07-16 NOTE — Progress Notes (Signed)
 Hematology/Oncology Consult note [phone] [phone]        REFERRING PROVIDER: Rollene Almarie LABOR, *   CHIEF COMPLAINTS/REASON FOR VISIT:  Evaluation of reticulocytopenia ,anemia, unintentional weight loss.   ASSESSMENT & PLAN:   Iron deficiency anemia Chronic anemia, multifactorial.  Iron deficiency anemia, anemia due to chronic disease. Lab Results  Component Value Date   HGB 10.4 (L) 06/18/2024   TIBC 329 06/18/2024   IRONPCTSAT 12 06/18/2024   FERRITIN 322 (H) 06/18/2024    Patient has increased soluble transferrin receptor, indicating iron deficiency. I discussed about option of oral iron supplementation versus IV iron treatments.  She is not interested in IV iron treatments.  Recommend patient to take over-the-counter Vitron-C 1 tablet daily.  Rheumatoid arthritis involving multiple sites [facility]) Follow-up with rheumatologist   Weight loss Probably secondary to decreased oral intake due to appetite change/uncontrolled pain secondary to rheumatoid arthritis.  TSH was normal.   Encourage protein rich diet   MGUS (monoclonal gammopathy of unknown significance) I discussed with patient about the diagnosis of IgA MGUS which is an asymptomatic condition which has a small risk of progression to smoldering multiple myeloma and to symptomatic multiple myeloma. Less frequently, these patients progress to AL amyloidosis, light chain deposition disease, or another lymphoproliferative disorder. For now I recommend observation. Check SPEP and light chain ratio every 6 months.      Orders Placed This Encounter  Procedures   Miscellaneous [facility] test (send-out)    Standing Status:   Future    Expected Date:   10/15/2024    Expiration Date:   01/13/2025    Scheduling Instructions:      Soluble Transferrin Receptor    Test name / description::   Soluble Transferrin Receptor [facility] [id number]   CBC (Cancer Center Only)    Standing Status:   Future     Expected Date:   10/15/2024    Expiration Date:   01/13/2025   Iron and TIBC    Standing Status:   Future    Expected Date:   10/15/2024    Expiration Date:   01/13/2025   Ferritin    Standing Status:   Future    Expected Date:   10/15/2024    Expiration Date:   01/13/2025   Retic Panel    Standing Status:   Future    Expected Date:   10/15/2024    Expiration Date:   01/13/2025   Follow-up 3 months All questions were answered. The patient knows to call the clinic with any problems, questions or concerns.  Zelphia Cap, MD, PhD [facility] Hematology Oncology 07/16/2024   HISTORY OF PRESENTING ILLNESS:   Jessica [name] is a  73 y.o.  female with PMH listed below was seen in consultation at the request of  Rollene Almarie LABOR, *  for evaluation of reticulocytopenia, anemia, unintentional weight loss  Discussed the use of AI scribe software for clinical note transcription with the patient, who gave verbal consent to proceed.   She presents with anemia, identified by her primary doctor, with a hemoglobin level of 9.6, decreased from four months ago.  She is scheduled for GI endoscopy on September 8th due to symptoms of nausea and lightheadedness.  She has a history of rheumatoid arthritis and is currently not on any medication for it due to intolerance to previous treatments, including Enbrel, Humira, and Rinvoq. She experiences joint pain and has been previously prescribed short courses of prednisone  for severe pain but is  not on long-term steroids. Her inflammation markers, including CRP and ESR, are elevated.  Currently she is not on any medication for rheumatoid arthritis.  She reports significant weight loss over the past three years, totaling at least 26 pounds, with fluctuations in the past six months between 152 and 148 pounds. She attributes her weight loss to a lack of appetite due to pain, which has led to reduced food intake. She has recently started a plant-based diet and has  been consuming high-protein drinks to supplement her diet when she cannot eat enough.  She experiences fatigue and low energy levels, which she attributes to her rheumatoid arthritis and associated inflammation.  Patient has a history of breast cancer that was diagnosed and treated in 2000.  INTERVAL HISTORY Jessica [name] is a 73 y.o. female who has above history reviewed by me today presents for follow up visit for iron deficient anemia.  MGUS.  Weight loss  Patient had blood work done at the last visit and presents to discuss results.  MEDICAL HISTORY:  Past Medical History:  Diagnosis Date   Allergy    Breast cancer (HCC) 2000   Cancer (HCC)    breast   Colon polyp    Coronary artery disease    GERD (gastroesophageal reflux disease)    History of blood transfusion    Hx of CABG    3/20   Hyperlipidemia    Migraine    history of migrarines, none as an adult   Personal history of radiation therapy 2000   Rheumatoid arthritis (HCC) 2023    SURGICAL HISTORY: Past Surgical History:  Procedure Laterality Date   ABDOMINAL HYSTERECTOMY     BREAST LUMPECTOMY Right 2000   BREAST SURGERY Right    calcifaction removed   BUNIONECTOMY Bilateral    BUNIONECTOMY Bilateral    COLONOSCOPY     COLONOSCOPY W/ POLYPECTOMY     CORONARY ARTERY BYPASS GRAFT N/A 01/05/2019   Procedure: CORONARY ARTERY BYPASS GRAFTING (CABG), using right leg saphenous endoscopic and open vein harvest, exploration left leg;  Surgeon: Lucas Dorise POUR, MD;  Location: [facility] OR;  Service: Open Heart Surgery;  Laterality: N/A;   EYE SURGERY Bilateral    cataract   hemorrhoid     LEFT HEART CATH AND CORONARY ANGIOGRAPHY N/A 01/01/2019   Procedure: LEFT HEART CATH AND CORONARY ANGIOGRAPHY;  Surgeon: Dann Candyce RAMAN, MD;  Location: [facility] INVASIVE CV LAB;  Service: Cardiovascular;  Laterality: N/A;   STERNAL WIRES REMOVAL N/A 05/11/2019   Procedure: STERNAL WIRES REMOVAL;  Surgeon: Lucas Dorise POUR, MD;   Location: [facility] OR;  Service: Thoracic;  Laterality: N/A;   TEE WITHOUT CARDIOVERSION N/A 01/05/2019   Procedure: TRANSESOPHAGEAL ECHOCARDIOGRAM (TEE);  Surgeon: Lucas Dorise POUR, MD;  Location: [facility] OR;  Service: Open Heart Surgery;  Laterality: N/A;    SOCIAL HISTORY: Social History   Socioeconomic History   Marital status: Married    Spouse name: Lebron   Number of children: Not on file   Years of education: Not on file   Highest education level: Master's degree (e.g., MA, MS, MEng, MEd, MSW, MBA)  Occupational History   Occupation: RETIRED  Tobacco Use   Smoking status: Never   Smokeless tobacco: Never  Vaping Use   Vaping status: Never Used  Substance and Sexual Activity   Alcohol use: No   Drug use: No   Sexual activity: Yes  Other Topics Concern   Not on file  Social History Narrative   Lives  with husband/2025   Social Drivers of Health   Financial Resource Strain: Low Risk  (04/08/2024)   Overall Financial Resource Strain (CARDIA)    Difficulty of Paying Living Expenses: Not hard at all  Food Insecurity: No Food Insecurity (06/18/2024)   Hunger Vital Sign    Worried About Running Out of Food in the Last Year: Never true    Ran Out of Food in the Last Year: Never true  Transportation Needs: No Transportation Needs (06/18/2024)   PRAPARE - Administrator, Civil Service (Medical): No    Lack of Transportation (Non-Medical): No  Physical Activity: Insufficiently Active (04/08/2024)   Exercise Vital Sign    Days of Exercise per Week: 3 days    Minutes of Exercise per Session: 30 min  Stress: No Stress Concern Present (04/08/2024)   [employer] of Occupational Health - Occupational Stress Questionnaire    Feeling of Stress: Not at all  Social Connections: Socially Integrated (04/08/2024)   Social Connection and Isolation Panel    Frequency of Communication with Friends and Family: Twice a week    Frequency of Social Gatherings with Friends and Family: Once  a week    Attends Religious Services: More than 4 times per year    Active Member of [employer] or Organizations: Yes    Attends Banker Meetings: 1 to 4 times per year    Marital Status: Married  Catering manager Violence: Not At Risk (06/18/2024)   Humiliation, Afraid, Rape, and Kick questionnaire    Fear of Current or Ex-Partner: No    Emotionally Abused: No    Physically Abused: No    Sexually Abused: No    FAMILY HISTORY: Family History  Problem Relation Age of Onset   Heart attack Father 8   Heart disease Brother    Asthma Paternal Aunt    Diabetes Paternal Aunt    Stroke Paternal Uncle    Kidney disease Paternal Uncle    Colon cancer Neg Hx    Esophageal cancer Neg Hx    Rectal cancer Neg Hx    Stomach cancer Neg Hx    Colon polyps Neg Hx     ALLERGIES:  is allergic to codeine, evolocumab , sulfa antibiotics, sulfacetamide, and adalimumab.  MEDICATIONS:  Current Outpatient Medications  Medication Sig Dispense Refill   Alpha-D-Galactosidase (ECK FOOD ENZYME PO) Take by mouth. 1 tablet po every day     Ascorbic Acid (VITAMIN C) 1000 MG tablet Take 2,000 mg by mouth daily.     Cholecalciferol (VITAMIN D -3) 125 MCG (5000 UT) TABS Take 5,000 Units by mouth daily.     linaclotide  (LINZESS ) 72 MCG capsule Take 1 capsule (72 mcg total) by mouth daily before breakfast. 30 capsule 3   loratadine (CLARITIN) 10 MG tablet Take 10 mg by mouth every evening.      Magnesium  Gluconate 550 MG TABS      Multiple Vitamin (MULTIVITAMIN WITH MINERALS) TABS tablet Take 1 tablet by mouth daily.     Omega-3 Fatty Acids (OMEGA-3 EPA FISH OIL PO) Take by mouth. 1 capsule po every day     ondansetron  (ZOFRAN ) 4 MG tablet Take 1 tablet (4 mg total) by mouth every 8 (eight) hours as needed for nausea or vomiting. 12 tablet 0   oxyCODONE -acetaminophen  (PERCOCET/ROXICET) 5-325 MG tablet Take 1 tablet by mouth every 6 (six) hours as needed for severe pain (pain score 7-10). 15 tablet 0    pantoprazole  (PROTONIX ) 40 MG tablet Take  1 tablet (40 mg total) by mouth daily. 30 tablet 3   aspirin  EC 81 MG tablet Take 1 tablet (81 mg total) by mouth daily. Swallow whole. (Patient not taking: Reported on 07/16/2024)     predniSONE  (DELTASONE ) 10 MG tablet Take 10 mg by mouth as directed. PRN with flareups (Patient not taking: Reported on 07/16/2024)     No current facility-administered medications for this visit.    Review of Systems  Constitutional:  Positive for appetite change and fatigue. Negative for chills, fever and unexpected weight change.  HENT:   Negative for hearing loss and voice change.   Eyes:  Negative for eye problems.  Respiratory:  Negative for chest tightness and cough.   Cardiovascular:  Negative for chest pain.  Gastrointestinal:  Negative for abdominal distention, abdominal pain and blood in stool.  Endocrine: Negative for hot flashes.  Genitourinary:  Negative for difficulty urinating and frequency.   Musculoskeletal:  Positive for arthralgias.  Skin:  Negative for itching and rash.  Neurological:  Negative for extremity weakness.  Hematological:  Negative for adenopathy.  Psychiatric/Behavioral:  Negative for confusion.    PHYSICAL EXAMINATION:  Vitals:   07/16/24 1013  BP: 113/69  Pulse: 82  Resp: 18  Temp: 98.8 F (37.1 C)  SpO2: 100%   Filed Weights   07/16/24 1013  Weight: 143 lb (64.9 kg)    Physical Exam  LABORATORY DATA:  I have reviewed the data as listed    Latest Ref Rng & Units 06/18/2024   10:12 AM 06/01/2024   11:10 AM 05/05/2024   10:35 AM  CBC  WBC 4.0 - 10.5 K/uL 5.1  5.5  5.4   Hemoglobin 12.0 - 15.0 g/dL 89.5  9.6  89.8   Hematocrit 36.0 - 46.0 % 33.9  30.5  31.8   Platelets 150 - 400 K/uL 231  258  352.0       Latest Ref Rng & Units 06/18/2024   10:12 AM 06/01/2024   11:10 AM 05/05/2024   10:35 AM  CMP  Glucose 70 - 99 mg/dL  90  93   BUN 8 - 23 mg/dL  9  15   Creatinine 9.55 - 1.00 mg/dL  9.30  9.23   Sodium 864  - 145 mmol/L  139  139   Potassium 3.5 - 5.1 mmol/L  3.8  4.0   Chloride 98 - 111 mmol/L  103  102   CO2 22 - 32 mmol/L  23  28   Calcium  8.9 - 10.3 mg/dL  9.2  9.9   Total Protein 6.5 - 8.1 g/dL 7.7   7.8   Total Bilirubin 0.0 - 1.2 mg/dL 0.6   0.5   Alkaline Phos 38 - 126 U/L 62   49   AST 15 - 41 U/L 17   18   ALT 0 - 44 U/L 10   8       RADIOGRAPHIC STUDIES: I have personally reviewed the radiological images as listed and agreed with the findings in the report. DG Knee Complete 4 Views Right Result Date: 06/01/2024 CLINICAL DATA:  Knee pain and swelling EXAM: RIGHT KNEE - COMPLETE 4+ VIEW COMPARISON:  Knee radiograph February 19, 2023 FINDINGS: No evidence of fracture, dislocation, or joint effusion. Distal femoral coarse calcifications likely representing bone infarct. Spiking of the tibial spine, tibial plateau and patellofemoral joint suggestive of degenerative changes. Fullness of right suprapatellar space indicating effusion. Surgical clips posterior to the knee joint. Atherosclerotic calcifications of  the femoral arteries IMPRESSION: Suprapatellar joint effusion .  Correlate with clinical findings. Mild degenerative changes of the knee joint. No acute osseous abnormality. Electronically Signed   By: Megan  Zare M.D.   On: 06/01/2024 12:18   MR FOOT LEFT WO CONTRAST Result Date: 05/12/2024 CLINICAL DATA:  Chronic mid foot pain EXAM: MRI OF THE LEFT FOOT WITHOUT CONTRAST TECHNIQUE: Multiplanar, multisequence MR imaging of the left forefoot was performed. No intravenous contrast was administered. COMPARISON:  None Available. FINDINGS: Bones/Joint/Cartilage No fracture or dislocation. Hallux valgus. No joint effusion. Severe osteoarthritis of the first MTP joint. Moderate osteoarthritis of the second TMT joint. Mild osteoarthritis of the second MTP joint. Periarticular marrow edema throughout the midfoot which may reflect stress reaction versus CRPS versus underlying inflammatory arthropathy.  Mild marrow edema along the plantar aspect of the second and third metatarsal heads likely reflecting mild stress reaction. Ligaments Collateral ligaments are intact.  Lisfranc ligament is intact. Muscles and Tendons Flexor, peroneal and extensor compartment tendons are intact. Muscles are normal. Soft tissue No fluid collection or hematoma. No soft tissue mass. Nonspecific soft tissue edema along the dorsal aspect of the foot. 13 mm ganglion cyst along the dorsal aspect of the talonavicular joint in with a narrow neck extending distally to the level of the middle cuneiform. IMPRESSION: 1. Periarticular marrow edema throughout the midfoot which may reflect stress reaction, early Charcot arthropathy, CRPS versus underlying inflammatory arthropathy. 2. Mild marrow edema along the plantar aspect of the second and third metatarsal heads likely reflecting mild stress reaction. 3. Severe osteoarthritis of the first MTP joint. 4. Moderate osteoarthritis of the second TMT joint. 5. Mild osteoarthritis of the second MTP joint. Electronically Signed   By: Julaine Blanch M.D.   On: 05/12/2024 14:45   MR FOOT RIGHT WO CONTRAST Result Date: 05/12/2024 CLINICAL DATA:  Bilateral mid foot pain EXAM: MRI OF THE RIGHT FOREFOOT WITHOUT CONTRAST TECHNIQUE: Multiplanar, multisequence MR imaging of the right forefoot was performed. No intravenous contrast was administered. COMPARISON:  None Available. FINDINGS: Bones/Joint/Cartilage No fracture or dislocation. Normal alignment. No joint effusion. Severe osteoarthritis of the first MTP joint. Moderate osteoarthritis of the second TMT joint. Mild osteoarthritis of the second MTP joint. Periarticular marrow edema throughout the midfoot which may reflect stress reaction versus CRPS versus underlying inflammatory arthropathy. Ligaments Collateral ligaments are intact.  Lisfranc ligament is intact. Muscles and Tendons Flexor, peroneal and extensor compartment tendons are intact. Muscles are  normal. Soft tissue No fluid collection or hematoma.  No soft tissue mass. IMPRESSION: 1. Periarticular marrow edema throughout the midfoot which may reflect stress reaction, early Charcot arthropathy, CRPS versus underlying inflammatory arthropathy. 2. Severe osteoarthritis of the first MTP joint. 3. Moderate osteoarthritis of the second TMT joint. 4. Mild osteoarthritis of the second MTP joint. Electronically Signed   By: Julaine Blanch M.D.   On: 05/12/2024 14:42

## 2024-07-16 NOTE — Assessment & Plan Note (Addendum)
 Follow up with rheumatologist

## 2024-07-16 NOTE — Assessment & Plan Note (Signed)
 I discussed with patient about the diagnosis of IgA MGUS which is an asymptomatic condition which has a small risk of progression to smoldering multiple myeloma and to symptomatic multiple myeloma. Less frequently, these patients progress to AL amyloidosis, light chain deposition disease, or another lymphoproliferative disorder. For now I recommend observation. Check SPEP and light chain ratio every 6 months.

## 2024-07-16 NOTE — Assessment & Plan Note (Addendum)
 Probably secondary to decreased oral intake due to appetite change/uncontrolled pain secondary to rheumatoid arthritis.  TSH was normal.   Encourage protein rich diet

## 2024-07-17 DIAGNOSIS — L249 Irritant contact dermatitis, unspecified cause: Secondary | ICD-10-CM | POA: Diagnosis not present

## 2024-07-21 DIAGNOSIS — D472 Monoclonal gammopathy: Secondary | ICD-10-CM | POA: Diagnosis not present

## 2024-07-21 DIAGNOSIS — Z1330 Encounter for screening examination for mental health and behavioral disorders, unspecified: Secondary | ICD-10-CM | POA: Diagnosis not present

## 2024-07-21 DIAGNOSIS — M0579 Rheumatoid arthritis with rheumatoid factor of multiple sites without organ or systems involvement: Secondary | ICD-10-CM | POA: Diagnosis not present

## 2024-07-21 DIAGNOSIS — D638 Anemia in other chronic diseases classified elsewhere: Secondary | ICD-10-CM | POA: Diagnosis not present

## 2024-07-23 ENCOUNTER — Ambulatory Visit: Admitting: Physical Therapy

## 2024-07-28 ENCOUNTER — Encounter: Payer: Self-pay | Admitting: Internal Medicine

## 2024-07-29 DIAGNOSIS — L309 Dermatitis, unspecified: Secondary | ICD-10-CM | POA: Diagnosis not present

## 2024-08-04 DIAGNOSIS — Z79899 Other long term (current) drug therapy: Secondary | ICD-10-CM | POA: Diagnosis not present

## 2024-08-04 DIAGNOSIS — M069 Rheumatoid arthritis, unspecified: Secondary | ICD-10-CM | POA: Diagnosis not present

## 2024-08-04 DIAGNOSIS — D649 Anemia, unspecified: Secondary | ICD-10-CM | POA: Diagnosis not present

## 2024-08-04 DIAGNOSIS — D472 Monoclonal gammopathy: Secondary | ICD-10-CM | POA: Diagnosis not present

## 2024-08-04 DIAGNOSIS — M1990 Unspecified osteoarthritis, unspecified site: Secondary | ICD-10-CM | POA: Diagnosis not present

## 2024-08-07 ENCOUNTER — Ambulatory Visit (HOSPITAL_COMMUNITY)
Admission: RE | Admit: 2024-08-07 | Discharge: 2024-08-07 | Disposition: A | Discharge status: Home / Self Care | Source: Ambulatory Visit | Attending: Internal Medicine | Admitting: Internal Medicine

## 2024-08-07 ENCOUNTER — Encounter (HOSPITAL_COMMUNITY): Payer: Self-pay

## 2024-08-07 DIAGNOSIS — I7121 Aneurysm of the ascending aorta, without rupture: Secondary | ICD-10-CM

## 2024-08-07 MED ORDER — IOHEXOL 350 MG/ML SOLN: 100.0000 mL | Freq: Once | INTRAVENOUS | Status: DC | PRN

## 2024-08-07 MED ORDER — SODIUM CHLORIDE (PF) 0.9 % IJ SOLN
INTRAMUSCULAR | Status: AC
  Filled 2024-08-07: qty 50

## 2024-08-07 NOTE — Progress Notes (Signed)
 Spoke with Eddie, CT who stated that the patient has left, IV no longer needed.

## 2024-08-10 ENCOUNTER — Encounter: Payer: Self-pay | Admitting: Internal Medicine

## 2024-08-10 NOTE — Telephone Encounter (Signed)
 FYI

## 2024-08-17 ENCOUNTER — Ambulatory Visit: Admitting: Podiatry

## 2024-08-19 ENCOUNTER — Ambulatory Visit: Admitting: Podiatry

## 2024-08-19 VITALS — Ht 66.0 in | Wt 143.0 lb

## 2024-08-19 DIAGNOSIS — M7671 Peroneal tendinitis, right leg: Secondary | ICD-10-CM | POA: Diagnosis not present

## 2024-08-19 DIAGNOSIS — M2012 Hallux valgus (acquired), left foot: Secondary | ICD-10-CM | POA: Diagnosis not present

## 2024-08-19 DIAGNOSIS — M21612 Bunion of left foot: Secondary | ICD-10-CM

## 2024-08-19 NOTE — Patient Instructions (Addendum)
 Contains text generated by [employer].          Peroneal Tendinopathy Rehab Ask your health care provider which exercises are safe for you. Do exercises exactly as told by your health care provider and adjust them as directed. It is normal to feel mild stretching, pulling, tightness, or discomfort as you do these exercises. Stop right away if you feel sudden pain or your pain gets worse. Do not begin these exercises until told by your health care provider. Stretching and range-of-motion exercises These exercises warm up your muscles and joints and improve the movement and flexibility of your ankle. These exercises also help to relieve pain and stiffness. Gastroc and soleus stretch, standing  This is an exercise in which you stand on a step and use your body weight to stretch your calf muscles. To do this exercise: Stand on the edge of a step on the ball of your left / right foot. The ball of your foot is on the walking surface, right under your toes. Keep your other foot firmly on the same step. Hold on to the wall, a railing, or a chair for balance. Slowly lift your other foot, allowing your body weight to press your left / right heel down over the edge of the step. You should feel a stretch in your left / right calf (gastrocnemius and soleus). Hold this position for 15 seconds. Return both feet to the step. Repeat this exercise with a slight bend in your left / right knee. Repeat 5 times with your left / right knee straight and 5 times with your left / right knee bent. Complete this exercise 2 times a day. Strengthening exercises These exercises build strength and endurance in your foot and ankle. Endurance is the ability to use your muscles for a long time, even after they get tired. Ankle dorsiflexion with band   Secure a rubber exercise band or tube to an object, such as a table leg, that will not move when the band is pulled. Secure the  other end of the band around your left / right foot. Sit on the floor, facing the object with your left / right leg extended. The band or tube should be slightly tense when your foot is relaxed. Slowly flex your left / right ankle and toes to bring your foot toward you (dorsiflexion). Hold this position for 15 seconds. Let the band or tube slowly pull your foot back to the starting position. Repeat 5 times. Complete this exercise 2 times a day. Ankle eversion Sit on the floor with your legs straight out in front of you. Loop a rubber exercise band or tube around the ball of your left / right foot. The ball of your foot is on the walking surface, right under your toes. Hold the ends of the band in your hands, or secure the band to a stable object. The band or tube should be slightly tense when your foot is relaxed. Slowly push your foot outward, away from your other leg (eversion). Hold this position for 15 seconds. Slowly return your foot to the starting position. Repeat 5 times. Complete this exercise 2 times a day. Plantar flexion, standing  This exercise is sometimes called standing heel raise. Stand with your feet shoulder-width apart. Place your hands on a wall or table to steady yourself as needed, but try not to  use it for support. Keep your weight spread evenly over the width of your feet while you slowly rise up on your toes (plantar flexion). If told by your health care provider: Shift your weight toward your left / right leg until you feel challenged. Stand on your left / right leg only. Hold this position for 15 seconds. Repeat 2 times. Complete this exercise 2 times a day. Single leg stand Without shoes, stand near a railing or in a doorway. You may hold on to the railing or door frame as needed. Stand on your left / right foot. Keep your big toe down on the floor and try to keep your arch lifted. Do not roll to the outside of your foot. If this exercise is too easy, you can  try it with your eyes closed or while standing on a pillow. Hold this position for 15 seconds. Repeat 5 times. Complete this exercise 2 times a day. This information is not intended to replace advice given to you by your health care provider. Make sure you discuss any questions you have with your health care provider. Document Revised: 01/27/2019 Document Reviewed: 01/27/2019 Elsevier Patient Education  2020 [employer].

## 2024-08-19 NOTE — Progress Notes (Signed)
 Subjective:  Patient ID: Jessica [name], female    DOB: June 27, 1951,  MRN: [id number]  Chief Complaint  Patient presents with   Ankle Pain    Rm 3 Patient is here for right ankle pain. Pt states a sensation of weakness/instability for the past week. Pt is also concerned about left foot bunion pain. Pt is here to discuss MRI results.    Discussed the use of AI scribe software for clinical note transcription with the patient, who gave verbal consent to proceed.  History of Present Illness Jessica [name] is a 73 year old female with rheumatoid arthritis who presents with right ankle pain.  She experiences pain in her right ankle, described as a sensation of giving way at times. The pain is located on the right side of the ankle and extends to the area behind the ankle, particularly along the peroneal tendons. It causes discomfort while driving, especially when applying the brakes. The pain had improved earlier in the week, leading her to cancel a previous appointment, but it has since returned. A pulling sensation is felt in the back of the ankle when pushing down like a gas pedal, but no pain occurs with other movements.  Previous MRIs have shown inflammation but no specific structural damage. Her current medications include those prescribed for rheumatoid arthritis.  She has a history of bunion surgery on her left foot, performed several years ago. The bunion has recurred, causing the toe to drift, although it is not currently painful. She uses pads and spacers at night to manage the alignment of her toes.  She has noticed a dark area under her foot, which she attributes to inflammation. Insoles and pads in her shoes have helped alleviate some discomfort. Her hip was previously found to be misaligned, contributing to her foot pain, but adjustments have provided relief.      Objective:    Physical Exam VASCULAR: DP and PT pulse palpable. Foot is warm and well-perfused.  Capillary fill time is brisk. DERMATOLOGIC: Normal skin turgor, texture, and temperature. No open lesions, rashes, or ulcerations. NEUROLOGIC: Normal sensation to light touch and pressure. No paresthesias. ORTHOPEDIC: Left foot hallux valgus deformity with medial bunion. Range of motion intact to the MTP joint, nonpainful. No pain in the midfoot. Edema and tenderness in the posterior right ankle along the peroneal tendons. Tenderness with plantar flexion and palpation in the retromolecular area. Smooth pain-free range of motion of all other examined joints. No ecchymosis or bruising. No gross deformity. No pain to palpation.   No images are attached to the encounter.    Results RADIOLOGY Foot MRI: Moderate marrow edema without stress fracture, severe osteoarthritis of the first MTP, moderate osteoarthritis of the second TMT bilaterally (05/08/2024) Left Foot X-ray: Moderate hallux valgus deformity with silver bunionectomy, no internal hardware, recurrence of bunion, degenerative changes of the joint   Assessment:   1. Peroneal tendinitis, right   2. Hallux valgus with bunions, left      Plan:  Patient was evaluated and treated and all questions answered.  Assessment and Plan Assessment & Plan Right peroneal tendinitis Right peroneal tendinitis with inflammation and tenderness along the peroneal tendons. Pain exacerbated by plantar flexion and palpation in the retromalleolar area. MRI showed inflammation likely related to rheumatoid arthritis. No significant structural damage noted. Expected resolution in 2-3 months with physical therapy. - Provide exercises for home physical therapy. - Dispense a lace up stabilizing ankle brace for support to facilitate soft tissue healing. - Apply  a compression sleeve gauntlet. - Utilize Voltaren gel for pain relief three to four times a day. - Schedule follow-up in eight weeks to reevaluate. - Advise to contact if symptoms worsen before  follow-up.  Rheumatoid arthritis with foot and ankle involvement Rheumatoid arthritis contributing to inflammation and stiffness in the foot and ankle. Current management involves medication with rheumatologist.  Bilateral first MTP severe osteoarthritis and second TMT moderate osteoarthritis Severe osteoarthritis of the first MTP and moderate osteoarthritis of the second TMT bilaterally. Current management involves medication management with rheumatologist.  Left hallux valgus deformity with recurrent bunion Left hallux valgus deformity with a medial bunion. Previous surgery involved shaving the bunion without realigning the bones, leading to recurrence. Currently non-painful but cosmetically concerning. Discussed non-surgical options and potential need for surgery if pain or functional limitations develop. - Continue using pads, spacers, and socks to manage alignment. - Consider surgical consultation if pain or functional limitations develop.      Return in about 10 weeks (around 10/28/2024) for re-check peroneal tendinitis.

## 2024-08-28 ENCOUNTER — Ambulatory Visit: Admitting: Internal Medicine

## 2024-08-28 ENCOUNTER — Encounter: Payer: Self-pay | Admitting: Internal Medicine

## 2024-08-28 VITALS — BP 130/70 | HR 56 | Temp 97.9°F | Ht 66.0 in | Wt 147.0 lb

## 2024-08-28 DIAGNOSIS — M0579 Rheumatoid arthritis with rheumatoid factor of multiple sites without organ or systems involvement: Secondary | ICD-10-CM

## 2024-08-28 DIAGNOSIS — D509 Iron deficiency anemia, unspecified: Secondary | ICD-10-CM | POA: Diagnosis not present

## 2024-08-28 DIAGNOSIS — Z23 Encounter for immunization: Secondary | ICD-10-CM

## 2024-08-28 DIAGNOSIS — R7303 Prediabetes: Secondary | ICD-10-CM

## 2024-08-28 DIAGNOSIS — E782 Mixed hyperlipidemia: Secondary | ICD-10-CM

## 2024-08-28 DIAGNOSIS — F5101 Primary insomnia: Secondary | ICD-10-CM

## 2024-08-28 DIAGNOSIS — I7121 Aneurysm of the ascending aorta, without rupture: Secondary | ICD-10-CM

## 2024-08-28 DIAGNOSIS — D472 Monoclonal gammopathy: Secondary | ICD-10-CM | POA: Diagnosis not present

## 2024-08-28 LAB — LIPID PANEL
Cholesterol: 211 mg/dL — ABNORMAL HIGH (ref 0–200)
HDL: 50.8 mg/dL (ref >39.00)
LDL Cholesterol: 146 mg/dL — ABNORMAL HIGH (ref 0–99)
NonHDL: 160.1
Total CHOL/HDL Ratio: 4
Triglycerides: 73 mg/dL (ref 0.0–149.0)
VLDL: 14.6 mg/dL (ref 0.0–40.0)

## 2024-08-28 LAB — CBC WITH DIFFERENTIAL/PLATELET
Basophils Absolute: 0 K/uL (ref 0.0–0.1)
Basophils Relative: 0.7 % (ref 0.0–3.0)
Eosinophils Absolute: 0.1 K/uL (ref 0.0–0.7)
Eosinophils Relative: 1 % (ref 0.0–5.0)
HCT: 30.8 % — ABNORMAL LOW (ref 36.0–46.0)
Hemoglobin: 9.9 g/dL — ABNORMAL LOW (ref 12.0–15.0)
Lymphocytes Relative: 44.9 % (ref 12.0–46.0)
Lymphs Abs: 2.3 K/uL (ref 0.7–4.0)
MCHC: 32.1 g/dL (ref 30.0–36.0)
MCV: 84.1 fl (ref 78.0–100.0)
Monocytes Absolute: 0.4 K/uL (ref 0.1–1.0)
Monocytes Relative: 8.2 % (ref 3.0–12.0)
Neutro Abs: 2.4 K/uL (ref 1.4–7.7)
Neutrophils Relative %: 45.2 % (ref 43.0–77.0)
Platelets: 282 K/uL (ref 150.0–400.0)
RBC: 3.66 Mil/uL — ABNORMAL LOW (ref 3.87–5.11)
RDW: 18.4 % — ABNORMAL HIGH (ref 11.5–15.5)
WBC: 5.2 K/uL (ref 4.0–10.5)

## 2024-08-28 LAB — HEMOGLOBIN A1C: Hgb A1c MFr Bld: 5.8 % (ref 4.6–6.5)

## 2024-08-28 LAB — RETICULOCYTES
ABS Retic: 66060 {cells}/uL (ref 20000–80000)
Retic Ct Pct: 1.8 %

## 2024-08-28 LAB — C-REACTIVE PROTEIN: CRP: 2.7 mg/dL (ref 0.5–20.0)

## 2024-08-28 LAB — VITAMIN B12: Vitamin B-12: 573 pg/mL (ref 211–911)

## 2024-08-28 LAB — FERRITIN: Ferritin: 324.8 ng/mL — ABNORMAL HIGH (ref 10.0–291.0)

## 2024-08-28 LAB — SEDIMENTATION RATE: Sed Rate: 78 mm/h — ABNORMAL HIGH (ref 0–30)

## 2024-08-28 NOTE — Assessment & Plan Note (Signed)
 She has difficulty maintaining sleep, waking after 2-3 hours. Age-related sleep changes and non-pharmacological interventions were discussed. Over-the-counter sleep aids such as melatonin and magnesium  were considered, along with non-pharmacological options like lavender and chamomile for relaxation.

## 2024-08-28 NOTE — Assessment & Plan Note (Signed)
 Checking ESR and CRP for stability. Her rheumatologist wants to put her on a biologic and she is unsure.

## 2024-08-28 NOTE — Assessment & Plan Note (Signed)
 Checking HGA1c and adjust as needed.

## 2024-08-28 NOTE — Assessment & Plan Note (Signed)
 Checking CBC and ferritin. Adjust as needed.

## 2024-08-28 NOTE — Assessment & Plan Note (Signed)
 Ordered CT chest without contrast as two attempts to get this done have failed and are significantly delaying care.

## 2024-08-28 NOTE — Assessment & Plan Note (Signed)
 Checking CBC and ferritin and retic count.

## 2024-08-28 NOTE — Progress Notes (Signed)
 Subjective:   Patient ID: Jessica [name], female    DOB: 12/13/1950, 73 y.o.   MRN: [id number]  Discussed the use of AI scribe software for clinical note transcription with the patient, who gave verbal consent to proceed.  History of Present Illness Jessica [name] is a 73 year old female with hypertension and a small aneurysm who presents with concerns about low blood pressure and sleep disturbances.  She has experienced episodes of low blood pressure, with the lowest recorded at 112/70 mmHg, accompanied by headaches. Her blood pressure has since stabilized, and she regularly monitors it.  She has a history of a small aneurysm identified in a previous CT scan. A follow-up CT scan with contrast was attempted but was unsuccessful due to difficulty in finding veins for IV access. She is concerned about her aneurysm and wishes to monitor it.  She reports significant sleep disturbances, sleeping only two to three hours at a time and being unable to return to sleep. Despite this, she does not feel tired during the day and does not nap. She has tried Sleepy Time tea without success and uses lavender on her pillowcase to aid sleep.  She mentions experiencing chills and a runny nose recently, which she associates with a possible illness. No recent changes in diet or salt intake, although she eats one meal a day and uses a protein drink as recommended by a health coach.  She is currently taking Repatha  every fourteen days and is considering an additional injection for rheumatoid arthritis, pending insurance approval. She inquires about the compatibility of taking both injections.  She mentions a concern about anemia, as discussed with her hematologist, but states she does not feel low energy. She requests to be checked for anemia during this visit. No breathing problems or chest pain.  Review of Systems  Constitutional:  Positive for chills.  HENT: Negative.    Eyes: Negative.    Respiratory:  Negative for cough, chest tightness and shortness of breath.   Cardiovascular:  Negative for chest pain, palpitations and leg swelling.  Gastrointestinal:  Negative for abdominal distention, abdominal pain, constipation, diarrhea, nausea and vomiting.  Musculoskeletal: Negative.   Skin: Negative.   Neurological: Negative.   Psychiatric/Behavioral: Negative.      Objective:  Physical Exam Constitutional:      Appearance: She is well-developed.  HENT:     Head: Normocephalic and atraumatic.  Cardiovascular:     Rate and Rhythm: Normal rate and regular rhythm.  Pulmonary:     Effort: Pulmonary effort is normal. No respiratory distress.     Breath sounds: Normal breath sounds. No wheezing or rales.  Abdominal:     General: Bowel sounds are normal. There is no distension.     Palpations: Abdomen is soft.     Tenderness: There is no abdominal tenderness.  Musculoskeletal:     Cervical back: Normal range of motion.  Skin:    General: Skin is warm and dry.  Neurological:     Mental Status: She is alert and oriented to person, place, and time.     Coordination: Coordination normal.     Vitals:   08/28/24 1105  BP: 130/70  Pulse: (!) 56  Temp: 97.9 F (36.6 C)  TempSrc: Oral  SpO2: 99%  Weight: 147 lb (66.7 kg)  Height: 5' 6 (1.676 m)    Assessment and Plan Assessment & Plan Aneurysm of the ascending aorta   There is concern about potential growth. A non-contrast CT  scan is ordered to assess the aneurysm size.  Iron deficiency anemia   She denies symptoms of low energy or fatigue, though the hematologist noted potential for low energy. Blood tests are ordered for anemia, including blood sugar, cholesterol, and B12 levels.  Insomnia   She has difficulty maintaining sleep, waking after 2-3 hours. Age-related sleep changes and non-pharmacological interventions were discussed. Over-the-counter sleep aids such as melatonin and magnesium  were considered, along  with non-pharmacological options like lavender and chamomile for relaxation.  General Health Maintenance   She received a flu shot today. Continue routine health maintenance and vaccinations as scheduled.

## 2024-08-28 NOTE — Patient Instructions (Addendum)
 We will do the blood work and I have ordered the CT scan without contrast.

## 2024-09-01 DIAGNOSIS — I7121 Aneurysm of the ascending aorta, without rupture: Secondary | ICD-10-CM | POA: Diagnosis not present

## 2024-09-01 DIAGNOSIS — E782 Mixed hyperlipidemia: Secondary | ICD-10-CM | POA: Diagnosis not present

## 2024-09-01 DIAGNOSIS — G4700 Insomnia, unspecified: Secondary | ICD-10-CM | POA: Diagnosis not present

## 2024-09-01 DIAGNOSIS — R7309 Other abnormal glucose: Secondary | ICD-10-CM | POA: Diagnosis not present

## 2024-09-01 DIAGNOSIS — D638 Anemia in other chronic diseases classified elsewhere: Secondary | ICD-10-CM | POA: Diagnosis not present

## 2024-09-01 DIAGNOSIS — M0579 Rheumatoid arthritis with rheumatoid factor of multiple sites without organ or systems involvement: Secondary | ICD-10-CM | POA: Diagnosis not present

## 2024-09-01 DIAGNOSIS — D472 Monoclonal gammopathy: Secondary | ICD-10-CM | POA: Diagnosis not present

## 2024-09-02 ENCOUNTER — Ambulatory Visit: Payer: Self-pay | Admitting: Internal Medicine

## 2024-09-03 ENCOUNTER — Encounter: Payer: Self-pay | Admitting: Gastroenterology

## 2024-09-03 ENCOUNTER — Ambulatory Visit: Admitting: Gastroenterology

## 2024-09-03 VITALS — BP 122/64 | HR 65 | Ht 66.0 in | Wt 146.5 lb

## 2024-09-03 DIAGNOSIS — K219 Gastro-esophageal reflux disease without esophagitis: Secondary | ICD-10-CM | POA: Diagnosis not present

## 2024-09-03 DIAGNOSIS — K5909 Other constipation: Secondary | ICD-10-CM

## 2024-09-03 DIAGNOSIS — R634 Abnormal weight loss: Secondary | ICD-10-CM | POA: Diagnosis not present

## 2024-09-03 DIAGNOSIS — D649 Anemia, unspecified: Secondary | ICD-10-CM

## 2024-09-03 NOTE — Progress Notes (Signed)
 Chief Complaint:follow-up constipation,bloating Primary GI Doctor: Dr. Federico  HPI: 73 year old A. A. female with history of rheumatoid arthritis, breast cancer, CAD, and GERD presents with bloating and constipation.  Patient last seen in GI office on 06/02/24 by Dr. Federico for constipation and bloating after colonoscopy.  06/18/24 seen by hematology for evaluation of reticulocytopenia ,anemia, unintentional weight loss. Additional lab work ordered.  07/16/24 follow-up with oncology. Diagnosed with MGUS. Check SPEP and light chain ratio every 6 months. Patient has increased soluble transferrin receptor, indicating iron deficiency. She is not interested in IV iron treatments. Recommend patient to take over-the-counter Vitron-C 1 tablet daily. Reviewed entire note.  Interval History    Patient presents for follow-up. Patient has chronic constipation and recently her Linzess  reduced from 145 mcg to 72 mcg po daily. She has a BM every day.  Se reports this has helped with the diarrhea, will only have urgency periodically versus daily with lower dose. We discussed adding back her Metamucil to help more with consistency.  Patient also was started Pantoprazole  40 mg po daily after her recent EGD. Patient reports the acid reflux, water brash, and nausea has improved since starting the medication. 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   She has been seen by hematology for low retic count. She reports in September she was told to start oral iron for IDA, but then later followed up with her PCP who told her that her labs are normal. She is currently not taking any supplementation.  She never completed CT due to issues with with starting IV.   Wt Readings from Last 3 Encounters:  09/03/24 146 lb 8 oz (66.5 kg)  08/28/24 147 lb (66.7 kg)  08/19/24 143 lb (64.9 kg)    Past Medical History:  Diagnosis Date   Allergy    Breast cancer (HCC) 2000    Cancer (HCC)    breast   Colon polyp    Coronary artery disease    GERD (gastroesophageal reflux disease)    History of blood transfusion    Hx of CABG    3/20   Hyperlipidemia    Migraine    history of migrarines, none as an adult   Personal history of radiation therapy 2000   Rheumatoid arthritis (HCC) 2023    Past Surgical History:  Procedure Laterality Date   ABDOMINAL HYSTERECTOMY     BREAST LUMPECTOMY Right 2000   BREAST SURGERY Right    calcifaction removed   BUNIONECTOMY Bilateral    BUNIONECTOMY Bilateral    COLONOSCOPY     COLONOSCOPY W/ POLYPECTOMY     CORONARY ARTERY BYPASS GRAFT N/A 01/05/2019   Procedure: CORONARY ARTERY BYPASS GRAFTING (CABG), using right leg saphenous endoscopic and open vein harvest, exploration left leg;  Surgeon: Lucas Dorise POUR, MD;  Location: [facility] OR;  Service: Open Heart Surgery;  Laterality: N/A;   EYE SURGERY Bilateral    cataract   hemorrhoid     LEFT HEART CATH AND CORONARY ANGIOGRAPHY N/A 01/01/2019   Procedure: LEFT HEART CATH AND CORONARY ANGIOGRAPHY;  Surgeon: Dann Candyce RAMAN, MD;  Location: [facility] INVASIVE CV LAB;  Service: Cardiovascular;  Laterality: N/A;   STERNAL WIRES REMOVAL N/A 05/11/2019   Procedure: STERNAL WIRES REMOVAL;  Surgeon: Lucas Dorise POUR, MD;  Location: [facility] OR;  Service: Thoracic;  Laterality: N/A;   TEE WITHOUT CARDIOVERSION N/A 01/05/2019   Procedure: TRANSESOPHAGEAL ECHOCARDIOGRAM (TEE);  Surgeon: Lucas Dorise  K, MD;  Location: [facility] OR;  Service: Open Heart Surgery;  Laterality: N/A;    Current Outpatient Medications  Medication Sig Dispense Refill   Ascorbic Acid (VITAMIN C) 1000 MG tablet Take 2,000 mg by mouth daily.     aspirin  EC 81 MG tablet Take 1 tablet (81 mg total) by mouth daily. Swallow whole.     Cholecalciferol (VITAMIN D -3) 125 MCG (5000 UT) TABS Take 5,000 Units by mouth daily.     linaclotide  (LINZESS ) 72 MCG capsule Take 1 capsule (72 mcg total) by mouth daily before breakfast. 30 capsule 3    loratadine (CLARITIN) 10 MG tablet Take 10 mg by mouth every evening.      Magnesium  Gluconate 550 MG TABS      methylPREDNISolone  (MEDROL ) 4 MG tablet Take 4 mg by mouth daily.     Multiple Vitamin (MULTIVITAMIN WITH MINERALS) TABS tablet Take 1 tablet by mouth daily.     Omega-3 Fatty Acids (OMEGA-3 EPA FISH OIL PO) Take by mouth. 1 capsule po every day     ondansetron  (ZOFRAN ) 4 MG tablet Take 1 tablet (4 mg total) by mouth every 8 (eight) hours as needed for nausea or vomiting. 12 tablet 0   pantoprazole  (PROTONIX ) 40 MG tablet Take 1 tablet (40 mg total) by mouth daily. 30 tablet 3   Abatacept (ORENCIA CLICKJECT) 125 MG/ML SOAJ Inject 1 pen Subcutaneous once weekly; Duration: 28 days (Patient not taking: Reported on 09/03/2024)     Alpha-D-Galactosidase (ECK FOOD ENZYME PO) Take by mouth. 1 tablet po every day     No current facility-administered medications for this visit.    Allergies as of 09/03/2024 - Review Complete 09/03/2024  Allergen Reaction Noted   Codeine Nausea And Vomiting 04/01/2015   Evolocumab  Other (See Comments) 09/21/2020   Sulfa antibiotics Swelling 04/01/2015   Sulfacetamide Swelling 08/13/2016   Adalimumab Other (See Comments) 05/26/2024    Family History  Problem Relation Age of Onset   Heart attack Father 22   Heart disease Brother    Asthma Paternal Aunt    Diabetes Paternal Aunt    Stroke Paternal Uncle    Kidney disease Paternal Uncle    Colon cancer Neg Hx    Esophageal cancer Neg Hx    Rectal cancer Neg Hx    Stomach cancer Neg Hx    Colon polyps Neg Hx     Review of Systems:    Constitutional: No weight loss, fever, chills, weakness or fatigue HEENT: Eyes: No change in vision               Ears, Nose, Throat:  No change in hearing or congestion Skin: No rash or itching Cardiovascular: No chest pain, chest pressure or palpitations   Respiratory: No SOB or cough Gastrointestinal: See HPI and otherwise negative Genitourinary: No dysuria  or change in urinary frequency Neurological: No headache, dizziness or syncope Musculoskeletal: No new muscle or joint pain Hematologic: No bleeding or bruising Psychiatric: No history of depression or anxiety    Physical Exam:  Vital signs: BP 122/64   Pulse 65   Ht 5' 6 (1.676 m)   Wt 146 lb 8 oz (66.5 kg)   SpO2 98%   BMI 23.65 kg/m   Constitutional:   Pleasant A.A. female appears to be in NAD, Well developed, Well nourished, alert and cooperative Throat: Oral cavity and pharynx without inflammation, swelling or lesion.  Respiratory: Respirations even and unlabored. Lungs clear to auscultation bilaterally.   No  wheezes, crackles, or rhonchi.  Cardiovascular: Normal S1, S2. Regular rate and rhythm. No peripheral edema, cyanosis or pallor.  Gastrointestinal:  Soft, nondistended, nontender. No rebound or guarding. Normal bowel sounds. No appreciable masses or hepatomegaly. Rectal:  Not performed.  Msk:  Symmetrical without gross deformities. Without edema, no deformity or joint abnormality.  Neurologic:  Alert and  oriented x4;  grossly normal neurologically.  Skin:   Dry and intact without significant lesions or rashes.  RELEVANT LABS AND IMAGING: CBC    Latest Ref Rng & Units 08/28/2024   11:36 AM 06/18/2024   10:12 AM 06/01/2024   11:10 AM  CBC  WBC 4.0 - 10.5 K/uL 5.2  5.1  5.5   Hemoglobin 12.0 - 15.0 g/dL 9.9  89.5  9.6   Hematocrit 36.0 - 46.0 % 30.8  33.9  30.5   Platelets 150.0 - 400.0 K/uL 282.0  231  258      CMP     Latest Ref Rng & Units 06/18/2024   10:12 AM 06/01/2024   11:10 AM 05/05/2024   10:35 AM  CMP  Glucose 70 - 99 mg/dL  90  93   BUN 8 - 23 mg/dL  9  15   Creatinine 9.55 - 1.00 mg/dL  9.30  9.23   Sodium 864 - 145 mmol/L  139  139   Potassium 3.5 - 5.1 mmol/L  3.8  4.0   Chloride 98 - 111 mmol/L  103  102   CO2 22 - 32 mmol/L  23  28   Calcium  8.9 - 10.3 mg/dL  9.2  9.9   Total Protein 6.5 - 8.1 g/dL 7.7   7.8   Total Bilirubin 0.0 - 1.2  mg/dL 0.6   0.5   Alkaline Phos 38 - 126 U/L 62   49   AST 15 - 41 U/L 17   18   ALT 0 - 44 U/L 10   8      Lab Results  Component Value Date   TSH 0.76 06/02/2024   Labs 05/2021: Hep B surface antigen negative. Hep B surface antibody NR. Hep B core total antibody negative.   Labs 03/2022: CBC with low Hb of 11.7. Nml AST and ALT.    Labs 04/2022: CBC with low Hb of 11.8. Ferritin nml at 234. HbA1C 6.2%. Vit B12 and Vit D nml.   Labs 06/02/24: Retic 1.3, vitamin D  31.87,iron 61, ferritin 358.5, TIBC 282.8, TSH 0.76   Colonoscopy 03/20/24: - Preparation of the colon was poor. - Stool in the descending colon and in the transverse colon. - One 10 mm polyp in the sigmoid colon, removed with a hot snare. Resected and retrieved. - Non- bleeding internal hemorrhoids. Path: 1. Surgical [P], colon, sigmoid, polyp (1) :       -TUBULAR ADENOMA       -ONE ADENOMATOUS FRAGMENT WITH STALK (NO ADENOMATOUS CHANGE IDENTIFIED AT STALK       MARGIN).       -NO HIGH-GRADE DYSPLASIA OR MALIGNANCY    Colonoscopy 04/02/24: - The examined portion of the ileum was normal. - Five 3 to 7 mm polyps in the transverse colon and in the ascending colon, removed with a cold snare. Resected and retrieved. - Non- bleeding internal hemorrhoids. Path: 1. Surgical [P], colon, transverse and ascending, polyp (5) :       - TUBULAR ADENOMA(S).       - NO HIGH GRADE DYSPLASIA OR MALIGNANCY   06/29/2024 EGD -  Normal esophagus. - Erythematous mucosa in the antrum. Biopsied. - Normal examined duodenum. Biopsied. Path: 1. Surgical [P], duodenal :       DUODENAL MUCOSA WITH PRESERVED VILLOGLANDULAR ARCHITECTURE WITHOUT INCREASED       INTRAEPITHELIAL LYMPHOCYTES OR EVIDENCE OF ACTIVE INFLAMMATION.       NO EVIDENCE OF GLUTEN SENSITIVE ENTEROPATHY.        2. Surgical [P], gastric random :       GASTRIC ANTRAL MUCOSA WITH NO SIGNIFICANT DIAGNOSTIC ALTERATION.       NO H. PYLORI IDENTIFIED ON H&E STAIN.       NEGATIVE FOR  INTESTINAL METAPLASIA OR DYSPLASIA.   Assessment: Encounter Diagnoses  Name Primary?   Chronic constipation Yes   Gastroesophageal reflux disease, unspecified whether esophagitis present    Anemia, unspecified type    Weight loss     73 year old female patient with chronic constipation who has done well with lower dose of Linzess  with fewer episodes of urgency and loose stool.  I did recommend she add over-the-counter Metamucil to see if this soaks up some of the looseness in the stool. Patient also was having uncontrolled reflux, nausea and weight loss.  September 2025 EGD with biopsy was normal.  Patient was started on PPI therapy which has resolved all of those issues. Pt working with nutritionist and gained 4lbs.    Lastly patient was referred to hematology for evaluation of reticulocytopenia ,anemia, unintentional weight loss. Chronic anemia, multifactorial.  Iron deficiency anemia, anemia due to chronic disease. Recommended she start OTC Vitron-C 1 tablet daily. Patient reports she did not take as when she had follow-up with PCP she was told everything was normal. Hgb currently 9.9. Ferritin 324.8. Retic 1.8. Patient requesting second opinion - referral to hematologist.   Plan: -Continue Pantoprazole  40 mg po daily -Continue Linzess  72 mcg po daily  -Can use Miralax  as needed -Add metamucil 1 tsp po daily -Follow-up with Dr. Federico 3 mths   Thank you for the courtesy of this consult. Please call me with any questions or concerns.   Amiracle Neises, FNP-C [facility] 09/03/2024, 1:08 PM  Cc: Rollene Almarie LABOR, *

## 2024-09-03 NOTE — Patient Instructions (Addendum)
 GERD Recommend GERD diet Continue Pantoprazole  40mg  po daily  Constipation Recommend high fiber diet Add back OTC Metamucil- lowest dose Continue Linzess  72mcg po daily  Anemia Will contact you via mychart with recommendations  _______________________________________________________  If your blood pressure at your visit was 140/90 or greater, please contact your primary care physician to follow up on this.  _______________________________________________________  If you are age 62 or older, your body mass index should be between 23-30. Your Body mass index is 23.65 kg/m. If this is out of the aforementioned range listed, please consider follow up with your Primary Care Provider.  If you are age 43 or younger, your body mass index should be between 19-25. Your Body mass index is 23.65 kg/m. If this is out of the aformentioned range listed, please consider follow up with your Primary Care Provider.   ________________________________________________________  The [facility] providers would like to encourage you to use MYCHART to communicate with providers for non-urgent requests or questions.  Due to long hold times on the telephone, sending your provider a message by [facility] may be a faster and more efficient way to get a response.  Please allow 48 business hours for a response.  Please remember that this is for non-urgent requests.  _______________________________________________________  [facility] is using a team-based approach to care.  Your team is made up of your doctor and two to three APPS. Our APPS (Nurse Practitioners and Physician Assistants) work with your physician to ensure care continuity for you. They are fully qualified to address your health concerns and develop a treatment plan. They communicate directly with your gastroenterologist to care for you. Seeing the Advanced Practice Practitioners on your physician's team can help you by facilitating care more  promptly, often allowing for earlier appointments, access to diagnostic testing, procedures, and other specialty referrals.   Thank you for trusting me with your gastrointestinal care. Deanna May, FNP-C

## 2024-09-15 NOTE — Progress Notes (Signed)
 Agree with the assessment and plan as outlined by [facility], FNP-C.  I am sorry, I do have any hematologists to recommend outside of [facility].  Would consider [facility] or [facility] as an option.   Elene Downum E. Stacia, MD [facility]

## 2024-09-21 ENCOUNTER — Ambulatory Visit: Attending: Physician Assistant | Admitting: Physician Assistant

## 2024-09-21 ENCOUNTER — Encounter: Payer: Self-pay | Admitting: Physician Assistant

## 2024-09-21 VITALS — BP 140/72 | HR 51 | Ht 66.0 in | Wt 148.0 lb

## 2024-09-21 DIAGNOSIS — E785 Hyperlipidemia, unspecified: Secondary | ICD-10-CM | POA: Diagnosis not present

## 2024-09-21 DIAGNOSIS — I2510 Atherosclerotic heart disease of native coronary artery without angina pectoris: Secondary | ICD-10-CM | POA: Diagnosis not present

## 2024-09-21 DIAGNOSIS — R011 Cardiac murmur, unspecified: Secondary | ICD-10-CM

## 2024-09-21 DIAGNOSIS — I7121 Aneurysm of the ascending aorta, without rupture: Secondary | ICD-10-CM

## 2024-09-21 DIAGNOSIS — Z951 Presence of aortocoronary bypass graft: Secondary | ICD-10-CM

## 2024-09-21 DIAGNOSIS — I10 Essential (primary) hypertension: Secondary | ICD-10-CM | POA: Diagnosis not present

## 2024-09-21 MED ORDER — REPATHA SURECLICK 140 MG/ML ~~LOC~~ SOAJ: 140.0000 mg | SUBCUTANEOUS | 3 refills | Status: AC

## 2024-09-21 NOTE — Progress Notes (Signed)
 Cardiology Office Note    Date:  09/21/2024   ID:  [name], [name] 09-Sep-1951, MRN [id number]  PCP:  Rollene Almarie LABOR, MD  Cardiologist:  None  Electrophysiologist:  None   Chief Complaint: Follow up  History of Present Illness:   Jessica [name] is a 73 y.o. female with history of CAD s/p CABG x 5 (12/2018), hyperlipidemia intolerant to statins, hypertension, and breast cancer who presents for follow-up on CAD and hyperlipidemia.    Jessica [name] was hospitalized 12/2018 in the setting of severe three-vessel CAD.  She had CABG x 5 with LIMA to LAD, vein to diagonal branch, obtuse marginal branches 1-2 sequentially to the RCA.  Echo at that time showed EF 60 to 65% with mild asymmetric LVH, and mild AI.  She did not tolerate high intensity statin therapy and was instead started on Praluent .  She underwent sternal rewiring by Dr. Sherrine in 04/2019.  She was seen in follow-up 12/2019 and reported dyspnea on exertion and bilateral calf claudication like symptoms.  ABIs were normal bilaterally.  TBI was abnormal on the right and normal on the left.  Echo showed EF 60 to 65% with G1 DD, moderately elevated PASP, mild AI, and trivial MR.  Her symptoms had resolved upon follow-up 06/2020.  Reportedly had side effects from Praluent  and was no longer taking this.  Was taking Repatha  instead.  Transition care to Dr. Gollan 02/2021 and was no longer taking Repatha  due to side effects.  She was started on ezetimibe .  Seen in follow-up 12/2021 and was no longer taking Zetia  due to side effects.   Patient was most recently seen in our office 05/06/2023 by Dr. Gollan.  She was overall doing well from a cardiac perspective.  Long discussion was had regarding management of hyperlipidemia.  She was agreeable to restart Repatha .  Patient presents today overall doing well from a cardiac perspective.  She ran out of her Repatha  and did not refill it.  She is hopeful to get restarted on this and knows that  her cholesterol is above goal.  She reports staying fairly active throughout the day with household activities.  She has not a flight of stairs in her home and is able to climb up and down these without chest pain and shortness of breath.  She has not been able to do structured exercise in quite some time due to arthritis pain.  She started on a new medication for her arthritis and is hopeful that she can get back into an exercise routine.  She denies chest pain, shortness of breath, lightheadedness, palpitations, and lower extremity swelling.  Labs independently reviewed: 08/2024-TC 211, TG 73, HDL 50, LDL 146, A1c 5.8, Hgb 9.9, HCT 30.8, platelets 282 05/2024-sodium 139, potassium 3.8, BUN 9, creatinine 0.69  Objective   Past Medical History:  Diagnosis Date   Allergy    Breast cancer (HCC) 2000   Cancer (HCC)    breast   Colon polyp    Coronary artery disease    GERD (gastroesophageal reflux disease)    History of blood transfusion    Hx of CABG    3/20   Hyperlipidemia    Migraine    history of migrarines, none as an adult   Personal history of radiation therapy 2000   Rheumatoid arthritis (HCC) 2023    Current Medications: No outpatient medications have been marked as taking for the 09/21/24 encounter (Appointment) with Lorene Lesley CROME, PA-C.    Allergies:  Codeine, Evolocumab , Sulfa antibiotics, Sulfacetamide, and Adalimumab   Social History   Socioeconomic History   Marital status: Married    Spouse name: Lebron   Number of children: Not on file   Years of education: Not on file   Highest education level: Master's degree (e.g., MA, MS, MEng, MEd, MSW, MBA)  Occupational History   Occupation: RETIRED  Tobacco Use   Smoking status: Never   Smokeless tobacco: Never  Vaping Use   Vaping status: Never Used  Substance and Sexual Activity   Alcohol use: No   Drug use: No   Sexual activity: Yes  Other Topics Concern   Not on file  Social History Narrative   Lives  with husband/2025   Social Drivers of Health   Financial Resource Strain: Low Risk  (08/25/2024)   Overall Financial Resource Strain (CARDIA)    Difficulty of Paying Living Expenses: Not hard at all  Food Insecurity: Unknown (08/25/2024)   Hunger Vital Sign    Worried About Running Out of Food in the Last Year: Not on file    Ran Out of Food in the Last Year: Never true  Transportation Needs: No Transportation Needs (08/25/2024)   PRAPARE - Administrator, Civil Service (Medical): No    Lack of Transportation (Non-Medical): No  Physical Activity: Insufficiently Active (08/25/2024)   Exercise Vital Sign    Days of Exercise per Week: 1 day    Minutes of Exercise per Session: 40 min  Stress: No Stress Concern Present (08/25/2024)   [employer] of Occupational Health - Occupational Stress Questionnaire    Feeling of Stress: Only a little  Social Connections: Unknown (08/25/2024)   Social Connection and Isolation Panel    Frequency of Communication with Friends and Family: Once a week    Frequency of Social Gatherings with Friends and Family: Never    Attends Religious Services: More than 4 times per year    Active Member of [employer] or Organizations: No    Attends Engineer, Structural: Not on file    Marital Status: Not on file     Family History:  The patient's family history includes Asthma in her paternal aunt; Diabetes in her paternal aunt; Heart attack (age of onset: 31) in her father; Heart disease in her brother; Kidney disease in her paternal uncle; Stroke in her paternal uncle. There is no history of Colon cancer, Esophageal cancer, Rectal cancer, Stomach cancer, or Colon polyps.  ROS:   12-point review of systems is negative unless otherwise noted in the HPI.  EKGs/Other Studies Reviewed:    Studies reviewed were summarized above. The additional studies were reviewed today:  12/2019 Echo complete 1. Left ventricular ejection fraction, by estimation, is  60 to 65%. No  significant change from previous echo in 2020.The left ventricle has  normal function. The left ventricle has no regional wall motion  abnormalities. Left ventricular diastolic  parameters are consistent with Grade I diastolic dysfunction (impaired  relaxation).   2. Right ventricular systolic function is normal. The right ventricular  size is normal. There is moderately elevated pulmonary artery systolic  pressure.   3. The mitral valve is normal in structure. Trivial mitral valve  regurgitation.   4. The aortic valve is tricuspid. Aortic valve regurgitation is mild.   5. The inferior vena cava is normal in size with greater than 50%  respiratory variability, suggesting right atrial pressure of 3 mmHg.   EKG:  EKG personally reviewed by  me today    PHYSICAL EXAM:    VS:  There were no vitals taken for this visit.  BMI: There is no height or weight on file to calculate BMI.  GEN: Well nourished, well developed in no acute distress NECK: No JVD; No carotid bruits CARDIAC: RRR, II/VI systolic murmur, no rubs or gallops RESPIRATORY:  Clear to auscultation without rales, wheezing or rhonchi  ABDOMEN: Soft, non-tender, non-distended EXTREMITIES: No edema; No deformity  Wt Readings from Last 3 Encounters:  09/03/24 146 lb 8 oz (66.5 kg)  08/28/24 147 lb (66.7 kg)  08/19/24 143 lb (64.9 kg)                  ASSESSMENT & PLAN:   Coronary artery disease - S/p CABG x 5 12/2018.  No symptoms of angina.  No further ischemic testing indicated at this time.  She is continued on aspirin  81 mg daily.  Will restart Repatha , see below.  Systolic murmur - 2 out of 6 systolic murmur noted on exam today.  Most recent echo 12/2019 was without significant valvular abnormalities.  Recommend updating echo.  Hyperlipidemia - Most recent lipid panel 08/2024 with LDL 146, goal less than 70.  Had run out of Repatha  at that time.  Recommend resuming Repatha  injection 140 mg/ML every  14 days. Anticipate repeat lipid panel at follow up.   Hypertension - BP mildly elevated in office today, although patient took an as needed dose of prednisone  last night.  She reports pressures at home typically run in the 120s systolic.  Recommend ongoing monitoring.  Will defer addition of medication at this time.  Ascending aortic aneurysm - Incidental finding on CT 04/2022, measured at 42 mm with recommendation for annual imaging. CT chest has been ordered by PCP.      Disposition: Update echo. Start Repatha . F/u with Dr. Gollan or an APP in 6 months.   Medication Adjustments/Labs and Tests Ordered: Current medicines are reviewed at length with the patient today.  Concerns regarding medicines are outlined above. Medication changes, Labs and Tests ordered today are summarized above and listed in the Patient Instructions accessible in Encounters.   Bonney Lesley Maffucci, PA-C 09/21/2024 7:50 AM     [facility] [place] [address] [place], [place] [address] [phone]

## 2024-09-21 NOTE — Patient Instructions (Addendum)
 Medication Instructions:  Your physician recommends the following medication changes.  START TAKING: Repatha  Injection every Two weeks   *If you need a refill on your cardiac medications before your next appointment, please call your pharmacy*  Lab Work: No labs ordered today  If you have labs (blood work) drawn today and your tests are completely normal, you will receive your results only by: MyChart Message (if you have MyChart) OR A paper copy in the mail If you have any lab test that is abnormal or we need to change your treatment, we will call you to review the results.  Testing/Procedures: Your physician has requested that you have an echocardiogram. Echocardiography is a painless test that uses sound waves to create images of your heart. It provides your doctor with information about the size and shape of your heart and how well your heart's chambers and valves are working.   You may receive an ultrasound enhancing agent through an IV if needed to better visualize your heart during the echo. This procedure takes approximately one hour.  There are no restrictions for this procedure.  This will take place at [address] [facility] [address] [facility], [place] [address]  Please note: We ask at that you not bring children with you during ultrasound (echo/ vascular) testing. Due to room size and safety concerns, children are not allowed in the ultrasound rooms during exams. Our front office staff cannot provide observation of children in our lobby area while testing is being conducted. An adult accompanying a patient to their appointment will only be allowed in the ultrasound room at the discretion of the ultrasound technician under special circumstances. We apologize for any inconvenience.   Follow-Up: At [facility], you and your health needs are our priority.  As part of our continuing mission to provide you with exceptional heart care, our providers are all part of  one team.  This team includes your primary Cardiologist (physician) and Advanced Practice Providers or APPs (Physician Assistants and Nurse Practitioners) who all work together to provide you with the care you need, when you need it.  Your next appointment:   6 month(s)  Provider:   You may see  one of the following Advanced Practice Providers on your designated Care Team:   Lonni Meager, NP Lesley Maffucci, PA-C Bernardino Bring, PA-C Cadence Carlsbad, PA-C Tylene Lunch, NP Barnie Hila, NP

## 2024-09-29 ENCOUNTER — Ambulatory Visit (HOSPITAL_COMMUNITY)
Admission: RE | Admit: 2024-09-29 | Discharge: 2024-09-29 | Disposition: A | Source: Ambulatory Visit | Attending: Internal Medicine | Admitting: Internal Medicine

## 2024-09-29 DIAGNOSIS — D649 Anemia, unspecified: Secondary | ICD-10-CM | POA: Diagnosis not present

## 2024-09-29 DIAGNOSIS — Z79899 Other long term (current) drug therapy: Secondary | ICD-10-CM | POA: Diagnosis not present

## 2024-09-29 DIAGNOSIS — I7121 Aneurysm of the ascending aorta, without rupture: Secondary | ICD-10-CM

## 2024-09-29 DIAGNOSIS — M069 Rheumatoid arthritis, unspecified: Secondary | ICD-10-CM | POA: Diagnosis not present

## 2024-09-30 ENCOUNTER — Other Ambulatory Visit: Payer: Self-pay

## 2024-09-30 ENCOUNTER — Telehealth: Payer: Self-pay

## 2024-09-30 DIAGNOSIS — D649 Anemia, unspecified: Secondary | ICD-10-CM

## 2024-09-30 DIAGNOSIS — R634 Abnormal weight loss: Secondary | ICD-10-CM

## 2024-09-30 DIAGNOSIS — R718 Other abnormality of red blood cells: Secondary | ICD-10-CM

## 2024-09-30 NOTE — Telephone Encounter (Signed)
-----   Message from Cathryne PARAS May sent at 09/15/2024 11:57 AM EST ----- Karna- Let patient know below. Apologize for delay was waiting for Dr. Levorn recommendations  Cathryne, NP ----- Message ----- From: Stacia Glendia BRAVO, MD Sent: 09/15/2024  11:45 AM EST To: Cathryne PARAS May, NP

## 2024-10-04 ENCOUNTER — Encounter: Payer: Self-pay | Admitting: Internal Medicine

## 2024-10-05 ENCOUNTER — Other Ambulatory Visit: Payer: Self-pay

## 2024-10-05 ENCOUNTER — Ambulatory Visit: Admitting: Family Medicine

## 2024-10-05 VITALS — BP 142/86 | HR 50 | Ht 66.0 in | Wt 146.0 lb

## 2024-10-05 DIAGNOSIS — G8929 Other chronic pain: Secondary | ICD-10-CM

## 2024-10-05 DIAGNOSIS — M25561 Pain in right knee: Secondary | ICD-10-CM

## 2024-10-05 NOTE — Patient Instructions (Addendum)
 Thank you for coming in today.   You received an injection today. Seek immediate medical attention if the joint becomes red, extremely painful, or is oozing fluid.   Let us  know when it starts hurting again.  Check back as needed

## 2024-10-05 NOTE — Progress Notes (Signed)
 [name], PhD, LAT, ATC acting as a scribe for Artist Lloyd, MD.  Jessica [name] is a 73 y.o. female who presents to [facility] today for exacerbation of her R knee pain. Pt was last seen by Dr. Lloyd on 10/29/23 and was given bilat knee steroid injections and she was advised to contact her rheumatologist.  Today, pt reports R returned around Aug. Pt was seen at the ED on Aug 11th. She notes feeling a pop in her R knee over the weekend.   Dx testing: 06/01/24 R knee XR 10/10/2023 LE vasc US    Pertinent review of systems: No fevers or chills  Relevant historical information: Rheumatoid arthritis now on Orencia   Exam:  BP (!) 142/86   Pulse (!) 50   Ht 5' 6 (1.676 m)   Wt 146 lb (66.2 kg)   SpO2 98%   BMI 23.57 kg/m  General: Well Developed, well nourished, and in no acute distress.   MSK: Right knee moderate effusion normal-appearing otherwise.  Tender palpation medial joint line. Stable ligamentous exam.    Lab and Radiology Results  Procedure: Real-time Ultrasound Guided Injection of right knee joint superior lateral patella space Device: Philips Affiniti 50G/GE Logiq Images permanently stored and available for review in PACS Verbal informed consent obtained.  Discussed risks and benefits of procedure. Warned about infection, bleeding, hyperglycemia damage to structures among others. Patient expresses understanding and agreement Time-out conducted.   Noted no overlying erythema, induration, or other signs of local infection.   Skin prepped in a sterile fashion.   Local anesthesia: Topical Ethyl chloride.   With sterile technique and under real time ultrasound guidance: 40 mg of Kenalog  and 2 mL of Marcaine injected into knee joint. Fluid seen entering the joint capsule.   Completed without difficulty   Pain immediately resolved suggesting accurate placement of the medication.   Advised to call if fevers/chills, erythema,  induration, drainage, or persistent bleeding.   Images permanently stored and available for review in the ultrasound unit.  Impression: Technically successful ultrasound guided injection.   EXAM: RIGHT KNEE - COMPLETE 4+ VIEW   COMPARISON:  Knee radiograph February 19, 2023   FINDINGS: No evidence of fracture, dislocation, or joint effusion. Distal femoral coarse calcifications likely representing bone infarct. Spiking of the tibial spine, tibial plateau and patellofemoral joint suggestive of degenerative changes. Fullness of right suprapatellar space indicating effusion. Surgical clips posterior to the knee joint. Atherosclerotic calcifications of the femoral arteries   IMPRESSION: Suprapatellar joint effusion .  Correlate with clinical findings.   Mild degenerative changes of the knee joint.   No acute osseous abnormality.     Electronically Signed   By: Megan  Zare M.D.   On: 06/01/2024 12:18  I, Artist Lloyd, personally (independently) visualized and performed the interpretation of the images attached in this note.      Assessment and Plan: 73 y.o. female with chronic right knee pain with acute exacerbation.  Majority the pain is due to osteoarthritis.  There may be a rheumatoid arthritis component as well.  Plan for repeat steroid injection today.  If this does not work well enough consider gel injections or MRI.   PDMP not reviewed this encounter. Orders Placed This Encounter  Procedures   US  LIMITED JOINT SPACE STRUCTURES LOW RIGHT(NO LINKED CHARGES)    Reason for Exam (SYMPTOM  OR DIAGNOSIS REQUIRED):   right knee pain    Preferred imaging location?:   [place]  [facility]   No orders of the defined types were placed in this encounter.    Discussed warning signs or symptoms. Please see discharge instructions. Patient expresses understanding.   The above documentation has been reviewed and is accurate and complete Artist Lloyd, M.D.

## 2024-10-18 ENCOUNTER — Encounter: Payer: Self-pay | Admitting: Family Medicine

## 2024-10-18 DIAGNOSIS — G8929 Other chronic pain: Secondary | ICD-10-CM

## 2024-10-19 NOTE — Telephone Encounter (Signed)
 Per visit note 10/05/24:  Assessment and Plan: 73 y.o. female with chronic right knee pain with acute exacerbation.  Majority the pain is due to osteoarthritis.  There may be a rheumatoid arthritis component as well.  Plan for repeat steroid injection today.  If this does not work well enough consider gel injections or MRI.

## 2024-10-21 NOTE — Addendum Note (Signed)
 Addended by: MARDY LEOTIS RAMAN on: 10/21/2024 08:09 AM   Modules accepted: Orders

## 2024-10-27 ENCOUNTER — Other Ambulatory Visit

## 2024-10-28 ENCOUNTER — Ambulatory Visit: Attending: Physician Assistant

## 2024-10-28 ENCOUNTER — Ambulatory Visit: Admitting: Podiatry

## 2024-10-28 DIAGNOSIS — I2510 Atherosclerotic heart disease of native coronary artery without angina pectoris: Secondary | ICD-10-CM

## 2024-10-28 DIAGNOSIS — R011 Cardiac murmur, unspecified: Secondary | ICD-10-CM | POA: Diagnosis not present

## 2024-10-28 LAB — ECHOCARDIOGRAM COMPLETE
AR max vel: 1.71 cm2
AV Area VTI: 1.83 cm2
AV Area mean vel: 1.62 cm2
AV Mean grad: 3 mmHg
AV Peak grad: 5.9 mmHg
AV Vena cont: 0.3 cm
Ao pk vel: 1.21 m/s
Area-P 1/2: 3.48 cm2
P 1/2 time: 540 ms
S' Lateral: 3.4 cm

## 2024-10-29 ENCOUNTER — Ambulatory Visit
Admission: RE | Admit: 2024-10-29 | Discharge: 2024-10-29 | Disposition: A | Discharge status: Home / Self Care | Source: Ambulatory Visit | Attending: Family Medicine | Admitting: Family Medicine

## 2024-10-29 ENCOUNTER — Ambulatory Visit: Payer: Self-pay | Admitting: Physician Assistant

## 2024-10-29 DIAGNOSIS — G8929 Other chronic pain: Secondary | ICD-10-CM

## 2024-11-02 ENCOUNTER — Ambulatory Visit: Payer: Self-pay | Admitting: Family Medicine

## 2024-11-02 ENCOUNTER — Ambulatory Visit

## 2024-11-02 VITALS — BP 136/81 | HR 74 | Resp 18 | Ht 66.0 in | Wt 146.0 lb

## 2024-11-02 DIAGNOSIS — I7121 Aneurysm of the ascending aorta, without rupture: Secondary | ICD-10-CM

## 2024-11-02 DIAGNOSIS — I2510 Atherosclerotic heart disease of native coronary artery without angina pectoris: Secondary | ICD-10-CM
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
## 2024-11-02 NOTE — Progress Notes (Signed)
 Right knee x-ray shows medium to severe arthritis.  You have areas of full-thickness cartilage loss.  We will work on authorization of gel shot which may be helpful.

## 2024-11-02 NOTE — Patient Instructions (Signed)

## 2024-11-02 NOTE — Progress Notes (Signed)
Left message on voice mail to schedule

## 2024-11-03 ENCOUNTER — Ambulatory Visit: Admitting: Oncology

## 2024-11-04 ENCOUNTER — Encounter: Payer: Self-pay | Admitting: Physician Assistant

## 2024-11-04 ENCOUNTER — Ambulatory Visit: Attending: Physician Assistant | Admitting: Physician Assistant

## 2024-11-04 VITALS — BP 130/60 | HR 81 | Ht 66.0 in | Wt 151.6 lb

## 2024-11-04 DIAGNOSIS — I5020 Unspecified systolic (congestive) heart failure: Secondary | ICD-10-CM | POA: Diagnosis not present

## 2024-11-04 DIAGNOSIS — I7121 Aneurysm of the ascending aorta, without rupture: Secondary | ICD-10-CM

## 2024-11-04 DIAGNOSIS — E785 Hyperlipidemia, unspecified: Secondary | ICD-10-CM | POA: Diagnosis not present

## 2024-11-04 DIAGNOSIS — I10 Essential (primary) hypertension: Secondary | ICD-10-CM

## 2024-11-04 DIAGNOSIS — Z951 Presence of aortocoronary bypass graft: Secondary | ICD-10-CM | POA: Diagnosis not present

## 2024-11-04 DIAGNOSIS — I25118 Atherosclerotic heart disease of native coronary artery with other forms of angina pectoris: Secondary | ICD-10-CM

## 2024-11-04 NOTE — Progress Notes (Signed)
 "  Cardiology Office Note    Date:  11/04/2024   ID:  Jessica [name], DOB 22-Nov-1950, MRN [id number]  PCP:  Rollene Almarie LABOR, MD  Cardiologist:  None  Electrophysiologist:  None   Chief Complaint: Follow up  History of Present Illness:   Jessica [name] is a 74 y.o. female with history of CAD s/p CABG x 5 (12/2018), hyperlipidemia intolerant to statins, hypertension, and breast cancer who presents for follow up on testing.     Ms. Coghlan was hospitalized 12/2018 in the setting of severe three-vessel CAD.  She had CABG x 5 with LIMA to LAD, vein to diagonal branch, obtuse marginal branches 1-2 sequentially to the RCA.  Echo at that time showed EF 60 to 65% with mild asymmetric LVH, and mild AI.  She did not tolerate high intensity statin therapy and was instead started on Praluent .  She underwent sternal rewiring by Dr. Sherrine in 04/2019.  She was seen in follow-up 12/2019 and reported dyspnea on exertion and bilateral calf claudication like symptoms.  ABIs were normal bilaterally.  TBI was abnormal on the right and normal on the left.  Echo showed EF 60 to 65% with G1 DD, moderately elevated PASP, mild AI, and trivial MR.  Her symptoms had resolved upon follow-up 06/2020.  Reportedly had side effects from Praluent  and was no longer taking this.  Was taking Repatha  instead.  Transition care to Dr. Gollan 02/2021 and was no longer taking Repatha  due to side effects.  She was started on ezetimibe .  Seen in follow-up 12/2021 and was no longer taking Zetia  due to side effects.    Patient was most recently seen in the cardiology clinic by myself 09/21/2024 overall feeling well.  She had run out of her Repatha .  This was refilled.  She was noted to have 2 out of 6 murmur on exam with echo ordered.  This revealed newly reduced EF of 45 to 50% with G1 DD, and mild MR.  Patient presents today feeling well from a cardiac perspective.  She is here to discuss the results of recent echo showing mildly  reduced EF.  She feels well without symptoms of shortness of breath, chest pain, lightheadedness, dizziness,, PND, or lower extremity swelling.  She reports not taking aspirin  for several months, as she thought she no longer needed it.  Labs independently reviewed: 08/2024-TC 211, TG 73, HDL 50, LDL 146, A1c 5.8, Hgb 9.9, HCT 30.8, platelets 282 05/2024-sodium 139, potassium 3.8, BUN 9, creatinine 0.69  Objective   Past Medical History:  Diagnosis Date   Allergy    Breast cancer (HCC) 2000   Cancer (HCC)    breast   Colon polyp    Coronary artery disease    GERD (gastroesophageal reflux disease)    History of blood transfusion    Hx of CABG    3/20   Hyperlipidemia    Migraine    history of migrarines, none as an adult   Personal history of radiation therapy 2000   Rheumatoid arthritis (HCC) 2023    Current Medications: Active Medications[1]  Allergies:   Codeine, Evolocumab , Sulfa antibiotics, Sulfacetamide, Quinolones, and Adalimumab   Social History   Socioeconomic History   Marital status: Married    Spouse name: Lebron   Number of children: Not on file   Years of education: Not on file   Highest education level: Master's degree (e.g., MA, MS, MEng, MEd, MSW, MBA)  Occupational History   Occupation: RETIRED  Tobacco Use   Smoking status: Never   Smokeless tobacco: Never  Vaping Use   Vaping status: Never Used  Substance and Sexual Activity   Alcohol use: No   Drug use: No   Sexual activity: Yes  Other Topics Concern   Not on file  Social History Narrative   Lives with husband/2025   Social Drivers of Health   Tobacco Use: Low Risk (11/04/2024)   Patient History    Smoking Tobacco Use: Never    Smokeless Tobacco Use: Never    Passive Exposure: Not on file  Financial Resource Strain: Low Risk (08/25/2024)   Overall Financial Resource Strain (CARDIA)    Difficulty of Paying Living Expenses: Not hard at all  Food Insecurity: Unknown (08/25/2024)   Epic     Worried About Programme Researcher, Broadcasting/film/video in the Last Year: Not on file    [employer] of Food in the Last Year: Never true  Transportation Needs: No Transportation Needs (08/25/2024)   Epic    Lack of Transportation (Medical): No    Lack of Transportation (Non-Medical): No  Physical Activity: Insufficiently Active (08/25/2024)   Exercise Vital Sign    Days of Exercise per Week: 1 day    Minutes of Exercise per Session: 40 min  Stress: No Stress Concern Present (08/25/2024)   [employer] of Occupational Health - Occupational Stress Questionnaire    Feeling of Stress: Only a little  Social Connections: Unknown (08/25/2024)   Social Connection and Isolation Panel    Frequency of Communication with Friends and Family: Once a week    Frequency of Social Gatherings with Friends and Family: Never    Attends Religious Services: More than 4 times per year    Active Member of [employer] or Organizations: No    Attends Banker Meetings: Not on file    Marital Status: Not on file  Depression (PHQ2-9): Low Risk (08/28/2024)   Depression (PHQ2-9)    PHQ-2 Score: 4  Alcohol Screen: Low Risk (04/04/2023)   Alcohol Screen    Last Alcohol Screening Score (AUDIT): 0  Housing: Low Risk (08/25/2024)   Epic    Unable to Pay for Housing in the Last Year: No    Number of Times Moved in the Last Year: 0    Homeless in the Last Year: No  Utilities: Not At Risk (06/18/2024)   Epic    Threatened with loss of utilities: No  Health Literacy: Adequate Health Literacy (04/09/2024)   B1300 Health Literacy    Frequency of need for help with medical instructions: Never     Family History:  The patient's family history includes Asthma in her paternal aunt; Diabetes in her paternal aunt; Heart attack (age of onset: 55) in her father; Heart disease in her brother; Kidney disease in her paternal uncle; Stroke in her paternal uncle. There is no history of Colon cancer, Esophageal cancer, Rectal cancer, Stomach  cancer, or Colon polyps.  ROS:   12-point review of systems is negative unless otherwise noted in the HPI.  EKGs/Other Studies Reviewed:    Studies reviewed were summarized above. The additional studies were reviewed today:  10/2024 2D echo 1. Left ventricular ejection fraction, by estimation, is 45 to 50%. The  left ventricle has mildly decreased function. The left ventricle  demonstrates global hypokinesis. Left ventricular diastolic parameters are  consistent with Grade I diastolic  dysfunction (impaired relaxation).   2. Right ventricular systolic function is normal. The right ventricular  size  is normal.   3. The mitral valve is normal in structure. Mild mitral valve  regurgitation. No evidence of mitral stenosis.   4. Tricuspid valve regurgitation is mild to moderate.   5. The aortic valve is tricuspid. Aortic valve regurgitation is mild to  moderate. No aortic stenosis is present.   6. The inferior vena cava is normal in size with greater than 50%  respiratory variability, suggesting right atrial pressure of 3 mmHg.   Comparison(s): 01/08/2020 EF 60-65%.   EKG:  EKG personally reviewed by me today    PHYSICAL EXAM:    VS:  BP 130/60 (BP Location: Left Arm, Patient Position: Sitting)   Pulse 81   Ht 5' 6 (1.676 m)   Wt 151 lb 9.6 oz (68.8 kg)   SpO2 99%   BMI 24.47 kg/m   BMI: Body mass index is 24.47 kg/m.  GEN: Well nourished, well developed in no acute distress NECK: No JVD; No carotid bruits CARDIAC: RRR, no murmurs, rubs, gallops RESPIRATORY:  Clear to auscultation without rales, wheezing or rhonchi  ABDOMEN: Soft, non-tender, non-distended EXTREMITIES: No edema; No deformity  Wt Readings from Last 3 Encounters:  11/04/24 151 lb 9.6 oz (68.8 kg)  11/02/24 146 lb (66.2 kg)  10/05/24 146 lb (66.2 kg)                  ASSESSMENT & PLAN:   Heart failure with mildly reduced EF - Echo 10/2024 revealed EF mildly reduced at 45 to 50% with global  hypokinesis, G1 DD, and mild MR.  Patient appears euvolemic on exam.  Recommend proceeding with PET stress to assess for ischemic cause of reduced EF.  Discussed starting GDMT.  However, she is hesitant to add additional medications at this time and would like to wait until after stress testing to do so.  Coronary artery disease - S/p CABG x 5 in 12/2018.  No symptoms of angina.  PET stress as above.  She is continued on aspirin  81 mg daily and Repatha .  Hyperlipidemia - Most recent lipid panel 08/2024 with LDL 146, goal less than 70.  Resumed Repatha  following previous appointment 09/2024.  Anticipate rechecking lipid panel at follow-up.  Hypertension - BP mildly elevated.  She reports home pressures in the 120s to 130s systolic.  She is hesitant to add additional medication, see above.  Ascending aortic aneurysm - Incidental finding on CT 04/2022, measured at 42 mm with recommendation for annual imaging.  Now following with vascular surgery.   Informed Consent   Shared Decision Making/Informed Consent The risks [chest pain, shortness of breath, cardiac arrhythmias, dizziness, blood pressure fluctuations, myocardial infarction, stroke/transient ischemic attack, nausea, vomiting, allergic reaction, radiation exposure, metallic taste sensation and life-threatening complications (estimated to be 1 in 10,000)], benefits (risk stratification, diagnosing coronary artery disease, treatment guidance) and alternatives of a cardiac PET stress test were discussed in detail with Ms. Persichetti and she agrees to proceed.      Disposition: Proceed with PET stress.  F/u with Dr. Gollan or an APP in 4 to 6 weeks/after testing.   Medication Adjustments/Labs and Tests Ordered: Current medicines are reviewed at length with the patient today.  Concerns regarding medicines are outlined above. Medication changes, Labs and Tests ordered today are summarized above and listed in the Patient Instructions accessible in  Encounters.   Bonney Lesley Maffucci, PA-C 11/04/2024 2:06 PM     [facility] [place] [address] [place], [place] [address] [phone]  [phone]      [1]  Current Meds  Medication Sig   Abatacept (ORENCIA CLICKJECT) 125 MG/ML SOAJ Inject 1 pen Subcutaneous once weekly; Duration: 28 days   Alpha-D-Galactosidase (ECK FOOD ENZYME PO) Take by mouth. 1 tablet po every day   Ascorbic Acid (VITAMIN C) 1000 MG tablet Take 2,000 mg by mouth daily.   aspirin  EC 81 MG tablet Take 1 tablet (81 mg total) by mouth daily. Swallow whole.   Cholecalciferol (VITAMIN D -3) 125 MCG (5000 UT) TABS Take 5,000 Units by mouth daily.   Evolocumab  (REPATHA  SURECLICK) 140 MG/ML SOAJ Inject 140 mg into the skin every 14 (fourteen) days.   linaclotide  (LINZESS ) 72 MCG capsule Take 1 capsule (72 mcg total) by mouth daily before breakfast.   loratadine (CLARITIN) 10 MG tablet Take 10 mg by mouth every evening.    Magnesium  Gluconate 550 MG TABS    mometasone  (ELOCON ) 0.1 % cream Apply topically as needed.   Multiple Vitamin (MULTIVITAMIN WITH MINERALS) TABS tablet Take 1 tablet by mouth daily.   Omega-3 Fatty Acids (OMEGA-3 EPA FISH OIL PO) Take by mouth. 1 capsule po every day   ondansetron  (ZOFRAN ) 4 MG tablet Take 1 tablet (4 mg total) by mouth every 8 (eight) hours as needed for nausea or vomiting.   pantoprazole  (PROTONIX ) 40 MG tablet Take 1 tablet (40 mg total) by mouth daily.   "

## 2024-11-04 NOTE — Patient Instructions (Signed)
 Medication Instructions:  Your physician recommends that you continue on your current medications as directed. Please refer to the Current Medication list given to you today.  *If you need a refill on your cardiac medications before your next appointment, please call your pharmacy*  Lab Work: No labs ordered today  If you have labs (blood work) drawn today and your tests are completely normal, you will receive your results only by: MyChart Message (if you have MyChart) OR A paper copy in the mail If you have any lab test that is abnormal or we need to change your treatment, we will call you to review the results.  Testing/Procedures:    Please report to Radiology at the [facility] Main Entrance 30 minutes early for your test.  [address] [place], [place] [address]                         OR   Please report to Radiology at [facility] Main Entrance, medical mall, 30 mins prior to your test.  [address]  [place], [place]  How to Prepare for Your Cardiac PET/CT Stress Test:  Nothing to eat or drink, except water, 3 hours prior to arrival time.  NO caffeine/decaffeinated products, or chocolate 12 hours prior to arrival. (Please note decaffeinated beverages (teas/coffees) still contain caffeine).  If you have caffeine within 12 hours prior, the test will need to be rescheduled.  Medication instructions: Do not take erectile dysfunction medications for 72 hours prior to test (sildenafil, tadalafil) Do not take nitrates (isosorbide mononitrate, Ranexa) the day before or day of test Do not take tamsulosin the day before or morning of test Hold theophylline containing medications for 12 hours. Hold Dipyridamole 48 hours prior to the test.  Diabetic Preparation: If able to eat breakfast prior to 3 hour fasting, you may take all medications, including your insulin . Do not worry if you miss your breakfast dose of insulin  - start at your next  meal. If you do not eat prior to 3 hour fast-Hold all diabetes (oral and insulin ) medications. Patients who wear a continuous glucose monitor MUST remove the device prior to scanning.  You may take your remaining medications with water.  NO perfume, cologne or lotion on chest or abdomen area. FEMALES - Please avoid wearing dresses to this appointment.  Total time is 1 to 2 hours; you may want to bring reading material for the waiting time.  IF YOU THINK YOU MAY BE PREGNANT, OR ARE NURSING PLEASE INFORM THE TECHNOLOGIST.  In preparation for your appointment, medication and supplies will be purchased.  Appointment availability is limited, so if you need to cancel or reschedule, please call the Radiology Department Scheduler at [phone] 24 hours in advance to avoid a cancellation fee of $100.00  What to Expect When you Arrive:  Once you arrive and check in for your appointment, you will be taken to a preparation room within the Radiology Department.  A technologist or Nurse will obtain your medical history, verify that you are correctly prepped for the exam, and explain the procedure.  Afterwards, an IV will be started in your arm and electrodes will be placed on your skin for EKG monitoring during the stress portion of the exam. Then you will be escorted to the PET/CT scanner.  There, staff will get you positioned on the scanner and obtain a blood pressure and EKG.  During the exam, you will continue  to be connected to the EKG and blood pressure machines.  A small, safe amount of a radioactive tracer will be injected in your IV to obtain a series of pictures of your heart along with an injection of a stress agent.    After your Exam:  It is recommended that you eat a meal and drink a caffeinated beverage to counter act any effects of the stress agent.  Drink plenty of fluids for the remainder of the day and urinate frequently for the first couple of hours after the exam.  Your doctor will  inform you of your test results within 7-10 business days.  For more information and frequently asked questions, please visit our website: [email or web address]  For questions about your test or how to prepare for your test, please call: Cardiac Imaging Nurse Navigators Office: [phone]   Follow-Up: At [facility], you and your health needs are our priority.  As part of our continuing mission to provide you with exceptional heart care, our providers are all part of one team.  This team includes your primary Cardiologist (physician) and Advanced Practice Providers or APPs (Physician Assistants and Nurse Practitioners) who all work together to provide you with the care you need, when you need it.  Your next appointment:   4-6 week(s)  Provider:   You may see  or one of the following Advanced Practice Providers on your designated Care Team:   Lonni Meager, NP Lesley Maffucci, PA-C Bernardino Bring, PA-C Cadence Oldtown, PA-C Tylene Lunch, NP Barnie Hila, NP    We recommend signing up for the patient portal called MyChart.  Sign up information is provided on this After Visit Summary.  MyChart is used to connect with patients for Virtual Visits (Telemedicine).  Patients are able to view lab/test results, encounter notes, upcoming appointments, etc.  Non-urgent messages can be sent to your provider as well.   To learn more about what you can do with MyChart, go to [email or web address].   Other Instructions

## 2024-11-05 NOTE — Addendum Note (Signed)
 Addended by: Jazzma Neidhardt E on: 11/05/2024 08:40 AM   Modules accepted: Orders

## 2024-11-17 ENCOUNTER — Other Ambulatory Visit: Payer: Self-pay | Admitting: Internal Medicine

## 2024-11-17 DIAGNOSIS — Z1231 Encounter for screening mammogram for malignant neoplasm of breast: Secondary | ICD-10-CM

## 2024-11-23 ENCOUNTER — Encounter: Payer: Self-pay | Admitting: Family Medicine

## 2024-11-23 DIAGNOSIS — G8929 Other chronic pain: Secondary | ICD-10-CM

## 2024-12-03 ENCOUNTER — Ambulatory Visit: Admitting: Orthopaedic Surgery

## 2024-12-07 ENCOUNTER — Ambulatory Visit

## 2024-12-15 ENCOUNTER — Ambulatory Visit: Admitting: Physician Assistant

## 2024-12-22 ENCOUNTER — Encounter: Admitting: Rheumatology

## 2024-12-24 ENCOUNTER — Other Ambulatory Visit

## 2025-01-19 ENCOUNTER — Ambulatory Visit: Admitting: Rheumatology

## 2025-04-15 ENCOUNTER — Ambulatory Visit
